# Patient Record
Sex: Male | Born: 1950 | Race: White | Hispanic: No | Marital: Married | Smoking: Never smoker
Health system: Southern US, Community
[De-identification: ages and names within clinical notes are randomized; demographics above are authoritative.]

## PROBLEM LIST (undated history)

## (undated) DIAGNOSIS — R39198 Other difficulties with micturition: Secondary | ICD-10-CM

## (undated) DIAGNOSIS — I38 Endocarditis, valve unspecified: Secondary | ICD-10-CM

## (undated) DIAGNOSIS — G629 Polyneuropathy, unspecified: Secondary | ICD-10-CM

## (undated) DIAGNOSIS — K469 Unspecified abdominal hernia without obstruction or gangrene: Secondary | ICD-10-CM

## (undated) DIAGNOSIS — E785 Hyperlipidemia, unspecified: Secondary | ICD-10-CM

## (undated) DIAGNOSIS — N189 Chronic kidney disease, unspecified: Secondary | ICD-10-CM

## (undated) DIAGNOSIS — I00 Rheumatic fever without heart involvement: Secondary | ICD-10-CM

## (undated) DIAGNOSIS — R600 Localized edema: Secondary | ICD-10-CM

## (undated) DIAGNOSIS — K760 Fatty (change of) liver, not elsewhere classified: Secondary | ICD-10-CM

## (undated) DIAGNOSIS — G4730 Sleep apnea, unspecified: Secondary | ICD-10-CM

## (undated) DIAGNOSIS — H6120 Impacted cerumen, unspecified ear: Secondary | ICD-10-CM

## (undated) DIAGNOSIS — G459 Transient cerebral ischemic attack, unspecified: Secondary | ICD-10-CM

## (undated) DIAGNOSIS — I240 Acute coronary thrombosis not resulting in myocardial infarction: Secondary | ICD-10-CM

## (undated) DIAGNOSIS — I509 Heart failure, unspecified: Secondary | ICD-10-CM

## (undated) DIAGNOSIS — I10 Essential (primary) hypertension: Secondary | ICD-10-CM

## (undated) HISTORY — DX: Transient cerebral ischemic attack, unspecified: G45.9

## (undated) HISTORY — PX: LITHOTRIPSY: SUR834

## (undated) HISTORY — DX: Endocarditis, valve unspecified: I38

## (undated) HISTORY — DX: Fatty (change of) liver, not elsewhere classified: K76.0

## (undated) HISTORY — DX: Essential (primary) hypertension: I10

## (undated) HISTORY — PX: CIRCUMCISION: SUR203

---

## 1998-02-08 ENCOUNTER — Emergency Department (HOSPITAL_COMMUNITY): Admission: EM | Admit: 1998-02-08 | Discharge: 1998-02-08 | Payer: Self-pay | Admitting: Emergency Medicine

## 1998-02-09 ENCOUNTER — Encounter: Admission: RE | Admit: 1998-02-09 | Discharge: 1998-05-10 | Payer: Self-pay | Admitting: Internal Medicine

## 2001-01-20 ENCOUNTER — Emergency Department (HOSPITAL_COMMUNITY): Admission: EM | Admit: 2001-01-20 | Discharge: 2001-01-20 | Payer: Self-pay | Admitting: Internal Medicine

## 2001-06-19 ENCOUNTER — Emergency Department (HOSPITAL_COMMUNITY): Admission: EM | Admit: 2001-06-19 | Discharge: 2001-06-20 | Payer: Self-pay | Admitting: Emergency Medicine

## 2001-06-20 ENCOUNTER — Encounter: Payer: Self-pay | Admitting: Emergency Medicine

## 2001-06-30 ENCOUNTER — Ambulatory Visit (HOSPITAL_COMMUNITY): Admission: RE | Admit: 2001-06-30 | Discharge: 2001-06-30 | Payer: Self-pay | Admitting: Cardiology

## 2001-08-29 ENCOUNTER — Encounter: Payer: Self-pay | Admitting: Family Medicine

## 2001-08-29 ENCOUNTER — Ambulatory Visit (HOSPITAL_COMMUNITY): Admission: RE | Admit: 2001-08-29 | Discharge: 2001-08-29 | Payer: Self-pay | Admitting: Family Medicine

## 2001-12-10 ENCOUNTER — Encounter: Payer: Self-pay | Admitting: Emergency Medicine

## 2001-12-10 ENCOUNTER — Emergency Department (HOSPITAL_COMMUNITY): Admission: EM | Admit: 2001-12-10 | Discharge: 2001-12-10 | Payer: Self-pay | Admitting: *Deleted

## 2003-10-06 ENCOUNTER — Emergency Department (HOSPITAL_COMMUNITY): Admission: EM | Admit: 2003-10-06 | Discharge: 2003-10-07 | Payer: Self-pay | Admitting: Emergency Medicine

## 2003-10-08 ENCOUNTER — Inpatient Hospital Stay (HOSPITAL_COMMUNITY): Admission: AD | Admit: 2003-10-08 | Discharge: 2003-10-10 | Payer: Self-pay | Admitting: Otolaryngology

## 2005-07-09 ENCOUNTER — Emergency Department (HOSPITAL_COMMUNITY): Admission: EM | Admit: 2005-07-09 | Discharge: 2005-07-09 | Payer: Self-pay | Admitting: Emergency Medicine

## 2006-07-15 ENCOUNTER — Encounter: Admission: RE | Admit: 2006-07-15 | Discharge: 2006-07-15 | Payer: Self-pay | Admitting: Family Medicine

## 2007-06-25 ENCOUNTER — Emergency Department (HOSPITAL_COMMUNITY): Admission: EM | Admit: 2007-06-25 | Discharge: 2007-06-25 | Payer: Self-pay | Admitting: Family Medicine

## 2007-09-05 ENCOUNTER — Inpatient Hospital Stay (HOSPITAL_COMMUNITY): Admission: RE | Admit: 2007-09-05 | Discharge: 2007-09-08 | Payer: Self-pay | Admitting: Internal Medicine

## 2007-09-06 ENCOUNTER — Ambulatory Visit: Payer: Self-pay | Admitting: Vascular Surgery

## 2007-09-07 ENCOUNTER — Encounter (INDEPENDENT_AMBULATORY_CARE_PROVIDER_SITE_OTHER): Payer: Self-pay | Admitting: Internal Medicine

## 2008-01-19 ENCOUNTER — Inpatient Hospital Stay (HOSPITAL_COMMUNITY): Admission: EM | Admit: 2008-01-19 | Discharge: 2008-01-24 | Payer: Self-pay | Admitting: Emergency Medicine

## 2008-01-19 ENCOUNTER — Ambulatory Visit: Payer: Self-pay | Admitting: Surgery

## 2008-01-19 ENCOUNTER — Encounter (INDEPENDENT_AMBULATORY_CARE_PROVIDER_SITE_OTHER): Payer: Self-pay | Admitting: Emergency Medicine

## 2008-01-23 ENCOUNTER — Encounter (INDEPENDENT_AMBULATORY_CARE_PROVIDER_SITE_OTHER): Payer: Self-pay | Admitting: Internal Medicine

## 2008-02-25 ENCOUNTER — Observation Stay (HOSPITAL_COMMUNITY): Admission: EM | Admit: 2008-02-25 | Discharge: 2008-02-27 | Payer: Self-pay | Admitting: Emergency Medicine

## 2008-02-25 ENCOUNTER — Ambulatory Visit: Payer: Self-pay | Admitting: Cardiology

## 2008-02-27 ENCOUNTER — Encounter (INDEPENDENT_AMBULATORY_CARE_PROVIDER_SITE_OTHER): Payer: Self-pay | Admitting: Internal Medicine

## 2008-12-10 ENCOUNTER — Ambulatory Visit: Payer: Self-pay | Admitting: Radiology

## 2008-12-10 ENCOUNTER — Emergency Department (HOSPITAL_BASED_OUTPATIENT_CLINIC_OR_DEPARTMENT_OTHER): Admission: EM | Admit: 2008-12-10 | Discharge: 2008-12-10 | Payer: Self-pay | Admitting: Emergency Medicine

## 2008-12-24 IMAGING — CR DG CHEST 1V PORT
1 series · 1 of 1 positions shown · non-contrast
Comparison: Chest radiograph 01/19/2008

CLINICAL DATA: Chest pain

PORTABLE CHEST - 1 VIEW

[view not recorded]
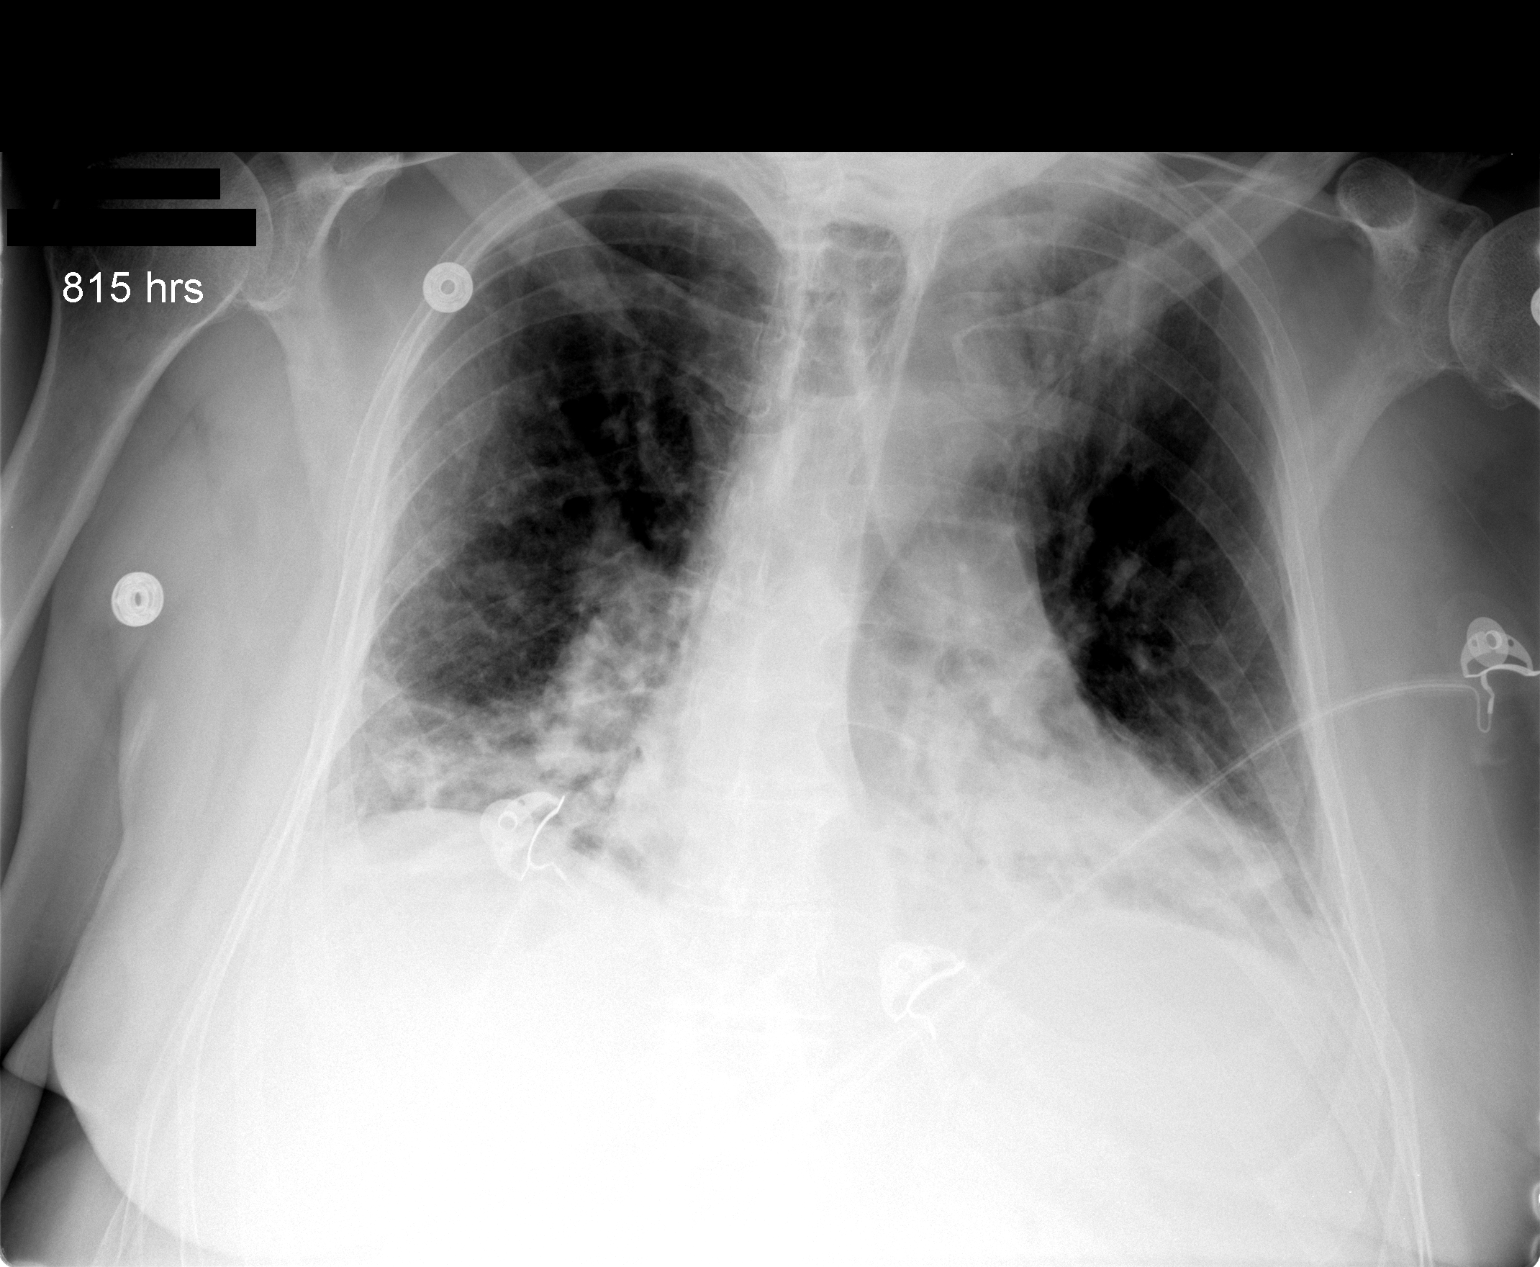

[1 of 1 positions shown; findings below may reference images not displayed]

FINDINGS: Stable cardiac silhouette.  There is increased air space
opacities at the left and right lung base.  Atelectasis in the left
upper lobe is similar to prior.  No pneumothorax.
IMPRESSION: 1..  New bibasilar air space disease representing pneumonia versus
aspiration.
2..  Left upper lobe atelectasis.  Recommend follow up radiographs
to assure resolution.

## 2010-01-30 ENCOUNTER — Emergency Department (HOSPITAL_COMMUNITY): Admission: EM | Admit: 2010-01-30 | Discharge: 2010-01-30 | Payer: Self-pay | Admitting: Emergency Medicine

## 2010-09-15 LAB — DIFFERENTIAL
Monocytes Relative: 6 % (ref 3–12)
Neutrophils Relative %: 71 % (ref 43–77)

## 2010-09-15 LAB — BASIC METABOLIC PANEL
BUN: 12 mg/dL (ref 6–23)
CO2: 30 mEq/L (ref 19–32)
GFR calc Af Amer: >60 mL/min (ref >60)
GFR calc non Af Amer: >60 mL/min (ref >60)
Glucose, Bld: 152 mg/dL — ABNORMAL HIGH (ref 70–99)
Sodium: 139 mEq/L (ref 135–145)

## 2010-09-15 LAB — CBC
Hemoglobin: 12.5 g/dL — ABNORMAL LOW (ref 13.0–17.0)
MCH: 30.8 pg (ref 26.0–34.0)
MCHC: 34.9 g/dL (ref 30.0–36.0)
MCV: 88.2 fL (ref 78.0–100.0)
Platelets: 250 10*3/uL (ref 150–400)

## 2010-09-15 LAB — HEPATIC FUNCTION PANEL
ALT: 47 U/L (ref 0–53)
Bilirubin, Direct: 0.1 mg/dL (ref 0.0–0.3)

## 2010-09-15 LAB — PROTIME-INR: Prothrombin Time: 12.9 seconds (ref 11.6–15.2)

## 2010-10-09 LAB — POCT CARDIAC MARKERS
CKMB, poc: <1 ng/mL — ABNORMAL LOW (ref 1.0–8.0)
Myoglobin, poc: 54.3 ng/mL (ref 12–200)

## 2010-10-09 LAB — BASIC METABOLIC PANEL
CO2: 29 mEq/L (ref 19–32)
Calcium: 8.9 mg/dL (ref 8.4–10.5)
Chloride: 103 mEq/L (ref 96–112)
Creatinine, Ser: 0.9 mg/dL (ref 0.4–1.5)
Sodium: 140 mEq/L (ref 135–145)

## 2010-10-09 LAB — CBC
HCT: 36.1 % — ABNORMAL LOW (ref 39.0–52.0)
Platelets: 263 10*3/uL (ref 150–400)
RDW: 13.1 % (ref 11.5–15.5)
WBC: 6.2 10*3/uL (ref 4.0–10.5)

## 2010-10-09 LAB — DIFFERENTIAL
Basophils Absolute: 0 10*3/uL (ref 0.0–0.1)
Basophils Relative: 0 % (ref 0–1)
Eosinophils Absolute: 0.1 10*3/uL (ref 0.0–0.7)
Lymphs Abs: 1.2 10*3/uL (ref 0.7–4.0)
Neutro Abs: 4.6 10*3/uL (ref 1.7–7.7)

## 2010-10-09 LAB — POCT B-TYPE NATRIURETIC PEPTIDE (BNP): B Natriuretic Peptide, POC: 24.4 pg/mL (ref 0–100)

## 2010-11-14 NOTE — H&P (Signed)
NAMEMEYER, ARORA               ACCOUNT NO.:  [id number]   MEDICAL RECORD NO.:  [id number]          PATIENT TYPE:  INP   LOCATION:  0112                         FACILITY:  [facility]   PHYSICIAN:  Herbie Saxon, MDDATE OF BIRTH:  11-29-1950   DATE OF ADMISSION:  01/19/2008  DATE OF DISCHARGE:                              HISTORY & PHYSICAL   PRIMARY CARE PHYSICIAN:  Della Goo, M.D.   HEALTH CARE POWER OF ATTORNEY:  His wife, Teofil Maniaci.  Phone number  [phone].  He is a full code.   PRESENTING COMPLAINT:  Weakness, three days.   HISTORY OF PRESENTING COMPLAINT:  This is a 60 year old male who was  well until three days ago when he was walking in the son outside a [employer], and he suddenly noticed diaphoresis, starting feeling extremely  weak and sweaty, had an episode of nausea.  He also noticed shortness of  breath on exertion.  Yesterday, the patient noticed fever, chills,  difficulty with urination.  However, he denies any cough, diarrhea, or  dysuria.  Today, the patient noticed left-sided chest soreness, dull,  nonradiating, no syncopal association or wheezing.  Patient recently  traveled to [place] on a bus for 24 hours.  At that time, he had left  leg swelling, which has receded.  Presently, he has right leg swelling,  which has been off and on intermittently since the 1990s.  He has cramps  and numbness in the right second toe.  Patient denies any skin rash or  joint swelling.  He has not been in contact with sick persons or with  flu-like symptoms.   On presentation to the emergency room, he had a high-grade fever, severe  hypertension, and liver enzymes were markedly elevated.   PAST MEDICAL HISTORY:  1. Peripheral edema.  2. Rheumatic fever in childhood.   FAMILY HISTORY:  Mother had emphysema.  Two brothers, coronary artery  disease.  A sister with coronary artery disease.   SOCIAL HISTORY:  He is married.  There is no history of  alcohol,  tobacco, or illicit drug abuse.   PAST SURGICAL HISTORY:  Kidney stone obstruction in the 1980s.   REVIEW OF SYSTEMS:  Fourteen systems were reviewed.  Pertinent positives  are as in the history of presenting complaint.   MEDICATIONS:  1. Darvocet-N 100 p.r.n.  2. Lasix 1 daily.  3. Potassium chloride 1 daily.   No known drug allergies.   PHYSICAL EXAMINATION:  A middle-aged man with truncal obesity.  Not in  acute respiratory distress.  Temperature 101.7, pulse 110, respiratory rate 20, blood pressure  165/101.  Pupils are equal and reactive to light and accommodation.  SKIN:  Clammy.  NECK:  Supple.  Mucous membranes are moist.  Head is normocephalic and atraumatic.  There is no elevated JVD or thyromegaly.  CHEST:  She has reduced breath sounds bibasilarly.  No rhonchi.  ABDOMEN:  Mild suprapubic and epigastric tenderness.  No organomegaly.  Bowel sounds are normoactive.  Inguinal orifices are patent.  He is alert and oriented to time, place, and person.  No asterixis.  He has 2+ pedal edema on the right and reduced peripheral pulses, mostly  on the right.  No clubbing or cyanosis.   LABORATORY TESTS:  WBCs 13.8, hematocrit 38, platelet count 308.  D-  dimer is 0.42.  INR 1.1.  PTT 34.  Chemistry shows a sodium of 134,  potassium 4, chloride 97, bicarbonate 28, glucose 142, BUN 8, creatinine  0.9.  Total CPK 745.  Index 0.1, MB fraction 0.9.  Troponin I is 0.04.  Urinalysis shows moderate leukocyte esterase, WBCs 7-10.  Liver exam:  AST is 132, ALT 216, ALP 142, total bilirubin 1.3.   Chest x-ray shows cardiomegaly, changes of COPD, chronic bronchitis,  mild bibasilar atelectasis.   EKG shows sinus tachycardia at 108 per minute with left axis deviation  and left ventricular hypertrophy.   ASSESSMENT:  1. Hypertensive urgency, acute hepatitis, viral versus secondary to      underlying sepsis.  2. Urinary tract infection.  3. Atypical chest pain.  4.  Rhabdomyolysis.  5. Mild hypernatremia.  6. Sinus tachycardia.  7. Query new onset of diabetes.  8. Leukocytosis.  9. Morbid obesity.   PLAN:  Patient is going to be admitted to a telemetry bed.  Will obtain  serial cardiac enzymes and EKG q.8h. x3.  Will follow up on abdominal  ultrasound to see his liver architecture.  Obvious bilateral leg venous  Doppler to rule out DVT.  Hepatitis B surface antigen, hepatitis C  antibody, ESR, and antinuclear antibody.  Will obtain hemoglobin A1C,  BNP, thyroid function tests, fasting lipids, and will send his blood  culture and urine culture.  He is to be on bedrest, monitor his input  and output strictly, oxygen 2-5 liters via nasal cannula p.r.n. to keep  sats greater than 90.   DIET:  Heart-healthy.   ACTIVITY:  Bedrest.   Heparin 5000 units subcu q.8h.  Phenergan 25 mg IV q.8h. p.r.n.  Protonix 40 mg IV daily.  Duonebs 1 unit dose q.6h. p.r.n. shortness of  breath.  Tylenol 650 mg p.o. p.r.n. mild-to-moderate pain.  Hold Tylenol  for now.  Ultram 50 mg q.8h. p.r.n.  Morphine 2 mg IV q.6h. p.r.n. chest  pain.  Nitroglycerin 0.4 mg sublingual q.83min. x3.  Rocephin 1 gm IV  daily.   Recheck his labs in the morning.   Lopressor 2.5 mg IV q.6h. p.r.n. for BP greater than 160/110.   Obtain fasting lipids and homocysteine level.  Also obtain an  echocardiogram if cardiac enzymes are positive.  Consult cardiology  input on cardiac catheterization also.  Patient is to stay on his  medications.  The treatment plan explained to him, and he and wife  verbalized understanding.      Herbie Saxon, MD  Electronically Signed     MIO/MEDQ  D:  01/19/2008  T:  01/19/2008  Job:  [id number]

## 2010-11-14 NOTE — Discharge Summary (Signed)
[name], [name]               ACCOUNT NO.:  [id number]   MEDICAL RECORD NO.:  [id number]          PATIENT TYPE:  INP   LOCATION:  1515                         FACILITY:  [facility]   PHYSICIAN:  Hind I Elsaid, MD      DATE OF BIRTH:  Nov 25, 1950   DATE OF ADMISSION:  09/05/2007  DATE OF DISCHARGE:  09/08/2007                               DISCHARGE SUMMARY   PRIMARY CARE PHYSICIAN:  Dr. Della Goo.   DISCHARGE DIAGNOSES:  1. Right lower extremity swelling.  Deep venous thrombosis was ruled      out.  2. History of chronic obstructive pulmonary disease/emphysema.  3. History of cor pulmonale.  4. History of peripheral artery disease.  5. Cardiomyopathy.  6. Benign ulcers.  7. Peritonitis.  8. Removal of kidney stone.  9. Hypertension.  10.History of umbilical/ventral hernia.  11.Hyperalbuminemia.   DISCHARGE MEDICATIONS:  1. Norvasc 5 mg p.o. daily.  2. Lasix 40 mg p.o. daily for two weeks.  3. Potassium chloride 10 mEq p.o. daily for two weeks.  4. Trimicinolone cream applied to the glans penis twice daily.   CONSULTATIONS:  Urology consulted for evaluation of the benign ulcers.  Diagnosis was balanitis.   PROCEDURES:  Venous Doppler of lower extremity was negative for DVT,  Baker cyst.   X-RAY:  Cardiomegaly and stable COPD and chronic change.   HISTORY OF PRESENT ILLNESS:  Please review the history done by Dr.  Della Goo.  In summary, he is a 60 year old male who was directly  admitted from Dr. Della Goo secondary to the swelling of the  right lower leg and fluid for about 3 to 5 days.  Swelling was on and  off in both lower extremities.   1. The patient was admitted for evaluation of that swelling.  Also      admitted to have benign ulcer.  The patient was admitted and      started on full-dose Lovenox for DVT.  Uric acid was also drawn      which was 5 within normal range.  Venous Doppler was negative.      Lovenox was discontinued.  We felt  that lower extremity swelling      could be a combination of hypoalbuminemia and cor pulmonale.  The      patient will be started on a small dose of Lasix 40 mg p.o. with      potassium supplement p.o.  The patient was advised to keep the      lower extremity elevated.  The patient has to follow up with Dr.      Della Goo.  2. Benign ulcerations.  The patient's pain is evaluated by Dr. Brunilda Payor.      Noted to have benign ulceration and difficulty voiding.  The      patient to continue Microgel to the glans b.i.d.  the patient to      follow with Dr. Brunilda Payor as an outpatient.  We felt that the patient is      medically stable to be discharged home.  During admission, the  patient also complained of dysphagia mainly to pills.  The patient      was advised to crush pills really good and his symptoms would      regress.   Consider review by the primary care for progression of swelling of his  right lwer extremity swelling and to evaluate his swallow and EGD.      [name], MD  Electronically Signed     HIE/MEDQ  D:  09/08/2007  T:  09/08/2007  Job:  [id number]

## 2010-11-14 NOTE — H&P (Signed)
NAMECASHUS, HALTERMAN               ACCOUNT NO.:  [id number]   MEDICAL RECORD NO.:  [id number]          PATIENT TYPE:  INP   LOCATION:  1825                         FACILITY:  [facility]   PHYSICIAN:  Vania Rea, M.D. DATE OF BIRTH:  [birth year]   DATE OF ADMISSION:  02/25/2008  DATE OF DISCHARGE:                              HISTORY & PHYSICAL   PRIORITY ADMISSION HISTORY AND PHYSICAL   PRIMARY CARE PHYSICIAN:  Della Goo, MD.   UROLOGISTLindaann Slough, MD.   CHIEF COMPLAINT:  Transient left-sided weakness.   HISTORY OF PRESENT ILLNESS:  This is a 60 year old Caucasian gentleman  with a history of hypertension and diabetes controlled on diet, who was  in his baseline state of health until about 1 p.m. at work today.  He  noticed he was dragging his left leg and developed blurring of vision.  This progressed to slurring of his speech by about 2:30 p.m.  He  discussed it with co-workers, who felt it may be due to side effects of  medication.  By about 3 p.m., the neurological deficits spontaneously  resolved.  He discussed it with a supervisor, who advised him to call  EMS.  EMS came.  By that time, his deficits had resolved, but he was  advised to come to the emergency room in any case.  The patient's  history was discussed with the neurologist and a decision was taken to  admit him for observation.   Patient has no previous history of a CVA or a TIA.  He does give an oral  history of numbness of feet at night, but no lateralizing signs.  He has  no history of hyperlipidemia.  There is no family history of stroke, but  there is a family history of coronary artery disease in siblings.  He  does not smoke or drink alcohol.   PAST MEDICAL HISTORY:  1. Discharged from [place] about 4 weeks ago after treatment for      pneumonia and newly discovered diabetes controlled on diet.  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  2. History of emphysema.  3. History of leaky heart valve.  4. History of fatty liver.   FAMILY HISTORY:  Significant for a mother with emphysema, coronary  artery disease in 2 brothers and a sister.   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   REVIEW OF SYSTEMS:  Significant for constipation, episodic lower  extremity edema, episodic bedtime numbness of the feet, episodic  anxiety, sometimes has kidney stones, but no dysuria.   MEDICATIONS:  Patient is unaware of the doses, but reports Lasix,  Gabapentin, potassium chloride, Darvocet, and aspirin 81 mg.  Discharge  summary 4 weeks ago indicates that he was also put on Norvasc 10 mg as  well as Nystatin Triamcinolone cream for his penile area.   ALLERGIES:  No known drug  allergies.   PHYSICAL EXAMINATION:  GENERAL:  He is a pleasant, middle-aged,  Caucasian gentleman sitting up in the stretcher in no acute distress,  somewhat anxious.  VITAL SIGNS:  Temperature is 97.4, pulse 87, respiration 18, blood  pressure 129/72, he is saturating 95% on room air.  He is in no pain.  HEENT:  His pupils are round and equal, mucous membranes are pink and  anicteric.  NECK:  He has no cervical lymphadenopathy, no thyromegaly, no  jugulovenous distention.  CHEST:  Clear to auscultation bilaterally.  CARDIOVASCULAR:  Regular rhythm, he has a 2/6 soft systolic murmur with  a loud S2.  ABDOMEN:  Obese, soft, and nontender, he has an umbilical hernia, his  bladder does not appear to be distended.  EXTREMITIES:  He has trace edema bilaterally.  He has 2+ pulses  bilaterally and some dilated external veins.  His feet are dirty and  unkempt.  He has no bony joint deformity.  CENTRAL NERVOUS SYSTEM:  Cranial nerves II-XII are intact.  He has no  focal neurologic deficits.  He has grade 5 power throughout.  GENITOURINARY:  He is  circumcised, he has an eschar covering the entire  glans of the penis, no meatus is evident, the base of the glans is  somewhat erythematous.   LABORATORY DATA:  His CBC is unremarkable with a white count of 6.9,  hemoglobin 12.7, MCV 89, platelet count is 306. He has a normal  differential on his white count.  His coags are normal.  Her serum  chemistry:  Sodium 137, potassium 3.7, chloride 102, CO2 27, BUN 13,  creatinine 0.9, glucose 128, calcium 9.2.  His total protein is 6.9,  albumin 3.5, AST 22, ALT 33, total bilirubin is 0.4.  His chest x-ray  shows mild cardiomegaly, no pulmonary edema or infiltrate.  CT of the  head shows chronic left maxillary and ethmoid sinusitis with  opacification of multiple left mastoid air cells on the left suggesting  mild mastoid sinusitis; no acute intracranial findings.   ASSESSMENT:  1. Transient ischemic attack.  2. Hypertension controlled.  3. Diabetes type 2 controlled on diet.  4. 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   PLAN:  1. Will admit this gentleman for evaluation for cardiovascular risk      factors and start risk reduction as necessary.  2. Have discussed his penile situation with the urologist on call for      Dr. Brunilda Payor and have been informed that, although there appears to be      no meatus, he usually is able to urinate without difficulty as they      know him well.  He is to be assessed for urinary retention and if      urinary retention is present they will come in to see him.  3. Other plans as per orders.  It is expected that this admission is      for observation and patient should be able to be discharged home      tomorrow if no further complications develop.      Vania Rea, M.D.  Electronically Signed     LC/MEDQ  D:  02/25/2008  T:  02/26/2008  Job:  [id number]   cc:   Della Goo, M.D.  Lindaann Slough, M.D.

## 2010-11-14 NOTE — Consult Note (Signed)
NAMECLEAVEN, Harold               ACCOUNT NO.:  [id number]   MEDICAL RECORD NO.:  [id number]          PATIENT TYPE:  INP   LOCATION:  1515                         [facility]ACILITY:  [facility]   PHYSICIAN:  Heloise Purpura, MD      DATE OF BIRTH:  19-Jan-1951   DATE OF CONSULTATION:  09/05/2007  DATE OF DISCHARGE:                                 CONSULTATION   REQUESTING PHYSICIAN:  Della Goo, M.D.   REASON FOR CONSULTATION:  Penile lesion.   HISTORY:  Mr. Gluth is a 60 year old who is seen in consultation at the  request of Dr.  Della Goo for a penile lesion.  According to the  patient, he has had a lesion on his penis for over 1 year.  He has  previously been evaluated by Dr. Brunilda Payor for balanitis,  which was treated  with combination of Nystatin/triamcinolone cream.  The patient  subsequently did not follow up and, therefore, did not undergo further  evaluation due to failure to follow up.  He was admitted to the hospital  earlier today with right lower extremity edema.  He denies any fever.  He does state that he has difficulty voiding within an intermittently  weak stream and some deflection of his stream, although at times he  feels that he can void well and empty to completion.   PAST MEDICAL HISTORY:  1. Sleep apnea.  2. Anxiety.  3. History of heart failure.  4. Depression.  5. Hypertension.   PAST SURGICAL HISTORY:  1. History of circumcision at age 44.  2. History of renal surgery.   FAMILY HISTORY:  There is a paternal history of bladder cancer.   SOCIAL HISTORY:  The patient is married.  He denies alcohol or tobacco  use.   REVIEW OF SYSTEMS:  As as stated above.   PHYSICAL EXAMINATION:  GU:  The patient has an eschar over his glans  penis which was able to be removed.  He appears to have a blister toward  his frenulum ventrally and erythema and inflammatory changes of his  glans penis circumferentially.  There is no discrete penile lesion.  His  urethral  meatus was probed and appears to be patent.   IMPRESSION:  Balanitis.   RECOMMENDATIONS:  The patient will be restarted on  Nystatin/triamcinolone cream applied to the glans penis twice daily.  I  am unsure whether his difficulty voiding was due to the eschar over his  glans penis, which has now  been removed, or truly due to meatal obstruction.  We will check post  void residuals and monitor his voiding and if need be consider placement  of a catheter.  I will plan to notify Dr. Brunilda Payor of the patient's  admission for further follow-up.      Heloise Purpura, MD  Electronically Signed     LB/MEDQ  D:  09/05/2007  T:  09/08/2007  Job:  [id number]   cc:   Della Goo, M.D.  Fax: [phone]

## 2010-11-14 NOTE — Discharge Summary (Signed)
[name], [name]               ACCOUNT NO.:  [id number]   MEDICAL RECORD NO.:  [id number]          PATIENT TYPE:  INP   LOCATION:  3020                         FACILITY:  [facility]   PHYSICIAN:  Renee Ramus, MD       DATE OF BIRTH:  September 29, 1950   DATE OF ADMISSION:  02/25/2008  DATE OF DISCHARGE:  02/27/2008                               DISCHARGE SUMMARY   PRIMARY DIAGNOSIS:  Acute cerebrovascular accident manifesting as  transient ischemic attack.   SECONDARY DIAGNOSES:  1. Chronic glans penis scar.  2. Diabetes mellitus type 2, well controlled.  3. Hyperlipidemia.  4. Hypertension.   HOSPITAL COURSE:  Problem #1:  Acute CVA.  The patient is a 60 year old  male with a history of hypertension and diabetes who was admitted  secondary to left foot drag and blurred vision and slurring of speech.  The patient was seen in emergency department, admitted to our service.  He was seen in neurological consult.  The patient did receive a CTA that  showed no intracranial vascular abnormalities and carotids that showed  no signs of significant stenosis.  The patient does have a  echocardiogram that is pending.  This will be done prior to discharge.  The patient has no residual deficits.  He is not a rehab candidate.  He  is being discharged on aspirin, statin therapy, as well as ACE  inhibitor.  The patient will follow up with his primary care physician  within 2 weeks for further adjustment of his medications and monitoring  of any potential toxic effects of the statin therapy.  Problem #2:  Diabetes mellitus:  The patient has been diagnosed with  this and is currently diet controlled.  Hemoglobin A1c is pending at the  time of discharge and this will be reviewed when available.  The patient  is not being started on diabetic medication currently, although the  sugars have been running in the 120s to 130s.  Problem #3.  Hyperlipidemia:  The patient does have significant  hyperlipidemia,  which has been treated with statin therapy.  Problem #4.  Glans penis scar:  This is chronic and possible urinary  retention is treated with diuretics.   LABORATORY DATA:  Labs of note,  1. Mild anemia with a hemoglobin of 12.4 and hematocrit of 36.1.  2. Elevated glucose at 128.  3. Total cholesterol of 244 with triglycerides of 245, cholesterol      332, HDL 32, LDL of 163, and VLDL of 49.  4. TSH of 1.28 and free T4 of 1.18.   DIAGNOSTIC STUDIES:  1. CT head without contrast showing acute intracranial findings.  2. CT angiogram of the head and neck showing a minimal atherosclerotic      irregularity at the proximal left internal carotid artery without      significant stenosis, otherwise no acute abnormalities.  3. Chest film showing mild cardiomegaly, but no pulmonary edema or      infiltrate.   MEDICATIONS ON DISCHARGE:  1. Aspirin 81 mg p.o. daily.  2. Neurontin 300 mg p.o. b.i.d.  3. Multivitamin p.o.  daily.  4. Potassium chloride 10 mEq p.o. daily.  5. Darvocet-N 100 one p.o. q.8 h. p.r.n. pain.  6. Lasix   The patient is stable and anxious for discharge. There are no labs or  studies pending at time of discharge.   TIME SPENT:  35 minutes.      Renee Ramus, MD  Electronically Signed     JF/MEDQ  D:  02/27/2008  T:  02/28/2008  Job:  [id number]   cc:   Della Goo, M.D.

## 2010-11-14 NOTE — Consult Note (Signed)
[name], WEISSBERG               ACCOUNT NO.:  [id number]   MEDICAL RECORD NO.:  [id number]          PATIENT TYPE:  INP   LOCATION:  3020                         FACILITY:  [facility]   PHYSICIAN:  Heloise Purpura, MD      DATE OF BIRTH:  Aug 19, 1950   DATE OF CONSULTATION:  02/26/2008  DATE OF DISCHARGE:                                 CONSULTATION   REASON FOR CONSULTATION:  Possible urinary retention.   HISTORY:  Mr. Ferraris is a 60 year old gentleman seen in consultation at  the request of Dr. Vania Rea for possible urinary retention.  He  was admitted to the emergency department and a decision has been made to  admit him to the hospital as well due to presentation consistent with a  transient ischemic attack.  He does have a history of balanitis for  which he has been followed by Dr. Brunilda Payor chronically.  This has been  managed with nystatin/triamcinolone cream.  I actually saw this patient  for consultation in March 2009, at which time he was felt to have a  possible penile lesion and urinary retention.  He did have some eschar  formation that was loosely attached to the glans penis, which was simply  removed at that time and he was subsequently able to void again without  difficulty.  This evening, he was examined and again felt to have eschar  over his penis with no urethral meatus identified.  Although, the  patient denies any discomfort, he did describe more difficulty urinating  with a split strain.  According to the patient, he was voiding fairly  normally yesterday and this morning.   PAST MEDICAL HISTORY:  1. Sleep apnea.  2. Anxiety.  3. History of heart failure.  4. Depression.  5. Hypertension.   PAST SURGICAL HISTORY:  1. History of circumcision at age 9.  2. History of renal surgery.   FAMILY HISTORY:  There is a paternal history of bladder cancer.   SOCIAL HISTORY:  The patient is married.  He denies tobacco or alcohol  use.   REVIEW OF SYSTEMS:   CARDIOVASCULAR:  He denies any chest pain or  shortness of breath.  PULMONARY:  He denies any cough or hemoptysis.  GI:  He denies any nausea or vomiting.  GU:  He has had a somewhat  weakened strain.  He denies hematuria or urinary frequency.   PHYSICAL EXAMINATION:  GU:  The patient has an eschar over his glans  penis, which was very loosely attached to his glans.  This was easily  removed and his underlying glans penis was examined with his urethral  meatus easily identified.   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   RECOMMENDATIONS:  I would plan to allow the patient to void  spontaneously.  He can be assessed with a postvoid residual urine during  this hospitalization and if it appears to be elevated, could potentially  require Foley catheterization.  However, at this time, based on the  patient's history of voiding normally earlier today, I would suspect  that he will be able to void  without difficulty and does not require  catheterization at this time.      Heloise Purpura, MD  Electronically Signed     LB/MEDQ  D:  02/26/2008  T:  02/26/2008  Job:  [id number]   cc:   Vania Rea, M.D.  Lindaann Slough, M.D.

## 2010-11-14 NOTE — H&P (Signed)
[name], [name]               ACCOUNT NO.:  [id number]   MEDICAL RECORD NO.:  [id number]          PATIENT TYPE:  INP   LOCATION:  1515                         FACILITY:  [facility]   PHYSICIAN:  Della Goo, M.D. DATE OF BIRTH:  06/06/1951   DATE OF ADMISSION:  09/05/2007  DATE OF DISCHARGE:                              HISTORY & PHYSICAL   PRIMARY CARE PHYSICIAN:  Della Goo, M.D.   CHIEF COMPLAINT:  Right foot and lower leg swelling and pain.   HISTORY OF PRESENT ILLNESS:  This is a 60 year old male who was directly  admitted from my office secondary to complaints of severe swelling of  the right lower leg and foot for the past 3-5 days.  The patient reports  having swelling off and on which usually occurs in both lower  extremities.  He was found to have 2+ pitting edema in the right foot  and 1+ edema up to the prepatellar area of the right lower extremity.  The patient reports having severe pain in the right foot.  He describes  the pain as being a sharp and shooting pain and there is difficulty  bearing weight in the right lower leg and foot.   The patient was also found to have another condition in the office.  The  patient reports having a penile ulcer which he has had for many years.  He reports off and on that the area scabs up and the scabbing falls off,  but returns.  The patient denies having any problems voiding.  He does  report being seen by an urologist in the past, approximately 1 year ago.  He was seen by Dr. Brunilda Payor, urology and was evaluated and prescribed  medication.  He was unable to followup.  The patient does report having  pain around the area as well.   The patient does also have multiple medical problems and he reports  losing his insurance approximately 1 year ago.   PAST MEDICAL HISTORY:  1. Significant for COPD/emphysema.  2. Cor pulmonale.  3. Peripheral arterial disease/peripheral vascular disease.  4. Cardiomyopathy.  5. Penile  ulcer.  6. Nephrolithiasis.  7. History of a circumcision.  8. Removal of kidney stone.  9. Hypertension.  10.The patient also has an umbilical/ventral hernia.   CURRENT MEDICATIONS:  None.  The patient previously had been on  hydrochlorothiazide therapy for hypertension.   ALLERGIES:  NO KNOWN MEDICAL ALLERGIES.  However, the patient does  report having palpitations with ADVIL PM.  He does report having no  problem taking any other over-the-counter nonsteroidals.  The thought is  that his adverse effect is to the BENADRYL component of the Advil PM.   SOCIAL HISTORY:  The patient is married.  No children.  Tobacco history-  -nonsmoker, nondrinker and no illicit drug usage.   FAMILY HISTORY:  Mother with hypertension and coronary artery disease in  his mother, sister and 2 brothers. Cancer--father had bladder CA.   REVIEW OF SYSTEMS:  The patient does report having fatigue, dizziness,  lightheadedness.  He reports having occasional chest pain, shortness of  breath and  edema.  He also reports having intermittent diarrhea,  constipation.  He does report having dysuria and hematuria at times.   PHYSICAL EXAMINATION:  GENERAL:  This is an obese 60 year old male in  discomfort, but no acute distress.  VITAL SIGNS:  Temperature 98.5, blood pressure 140/82, heart rate 86,  respirations 22, O2 saturation 99% on room air, weight 212 pounds,  height 5 feet 7 inches or 67 inches.  HEENT:  Normocephalic, atraumatic.  No scleral icterus.  Pupils are equally round and reactive to light.  Extraocular muscles are intact.  Funduscopic benign.  Oropharynx is  clear.  NECK:  Supple.  Full range of motion.  No thyromegaly, adenopathy,  jugular venous distention.  CARDIOVASCULAR:  Regular rate and rhythm.  LUNGS:  Clear to auscultation bilaterally.  ABDOMEN:  Positive large umbilical hernia present.  It is reducible and  soft.  Positive bowel sounds, soft, nontender, nondistended abdomen.  It  is  obese.  Tympanic in all 4 quadrants.  No hepatosplenomegaly.  No  rebound or guarding.  EXTREMITIES:  With 2+ pedal edema and 1+ edema to the prepatellar area  of the right lower extremity.  Full range of motion of both lower  extremities.  No cyanosis, clubbing or edema in the left lower  extremity.  NEUROLOGIC:  Nonfocal.  GENITOURINARY:  Positive crusting and denudation of the meatus of the  penis.  There is a vascular-appearing bleb below the penis and on the  scrotum contiguous with the penile shaft.  The meatal area is also  friable and so is the left upper quadrant area of the anterior scrotum  area.   LABORATORY DATA:  Laboratory studies are pending at this time.   ASSESSMENT:  23. A 60 year old male being admitted with right lower extremity edema,      rule out deep venous thrombosis.  2. Chronic penile ulcer.  3. Hypertension.  4. Cor pulmonale history.  5. Cardiomyopathy.  6. Chronic obstructive pulmonary disease/emphysema.   PLAN:  The patient will be admitted and a venous duplex ultrasound study  will be ordered of the right lower extremity to rule out a DVT.  Full-  dose Lovenox therapy has been ordered.  An urology consultation has also  been  placed for a treatment plan of the chronic ulceration.  Also there is a  question of whether or not this is a neoplastic process.  Laboratory  studies on admission have been ordered and also a chest x-ray.  Case  management will also be consulted to assist the patient with his  medical/financial resources.      Della Goo, M.D.  Electronically Signed     HJ/MEDQ  D:  09/05/2007  T:  09/06/2007  Job:  [id number]   cc:   Della Goo, M.D.  Fax: [phone]

## 2010-11-14 NOTE — Discharge Summary (Signed)
[name], [name]               ACCOUNT NO.:  [id number]   MEDICAL RECORD NO.:  [id number]          PATIENT TYPE:  INP   LOCATION:  1442                         FACILITY:  [facility]   PHYSICIAN:  Hind I Elsaid, MD      DATE OF BIRTH:  Jan 16, 1951   DATE OF ADMISSION:  01/19/2008  DATE OF DISCHARGE:  01/24/2008                               DISCHARGE SUMMARY   PRIMARY CARE PHYSICIAN:  Della Goo, M.D.   DISCHARGE DIAGNOSES:  1. Atypical chest pain.  2. Left lung pneumonia.  3. Sinus tachycardia, resolved.  4. Hypertension.  5. Deconditioning.  6. Fatty liver.  7. Abnormal liver function tests felt to be secondary to fatty liver.  8. Leukocytosis, resolved.  9. Chronic balanitis.  10.Staph aureus of the penile ulcer.  11.Obesity.  12.Diabetes mellitus, controlled with diet.  13.Nausea and vomiting, resolved, felt to be secondary to possible      viral infection.  14.Chronic bilateral lower extremity swelling.   MEDICATIONS:  1. Aspirin 81 mg daily.  2. Norvasc 10 mg daily.  3. Avelox 400 mg daily for 1 week.  4. Mycolog cream which is nystatin/triamcinolone cream apply to the      penile area q.12 h.  5. Lasix 40 mg.  6. Potassium chloride 10 mEq daily.   PROCEDURES:  1. Chest x-ray:  Mild bibasilar atelectasis.  2. Ultrasound of the abdomen:  Hepatomegaly with fatty infiltration of      the liver.  3. Chest x-ray:  New bibasilar airspace disease representing pneumonia      versus aspiration.  4. Next x-ray:  Improvement in right basal aeration.  No change of      airspace disease in the left lung apex and left base.   CONSULTATIONS:  None.   HISTORY OF PRESENT ILLNESS:  This is a 61 year old male who presented  with diaphoresis, generalized body weakness, sweating, some shortness of  breath on exertion and fever.  Found to have fever of 101.7 and heart  rate of 110.  At that time, the patient also complained of chest pain  felt to be admission for evaluation of  his above symptoms.   HOSPITAL COURSE:  1. Chest pain.  The patient had an EKG which shows sinus tachycardia      with a heart rate of 103.  The patient admitted to telemetry.      Serial EKG did not show any significant change and cardiac enzymes      did not show any elevation.  We felt the chest pain is atypical in      nature.  2. Hypertension.  The patient prescribed Norvasc and blood pressure      remained under good control.  3. Elevated liver function tests.  Hepatitis profile was negative.      Had an ultrasound that showed fatty liver.  Liver function tests      significantly improved.  We felt the elevated LFTs secondary to      fatty liver.  4. Left lower lobe pneumonia.  The patient had repeat chest x-ray  after developing fever which showed evidence of left lower lobe      pneumonia.  Accordingly, the patient was started on Avelox.  Since      then, the patient has no evidence of white blood cells or fever.      Last white blood cells were 8.2 and hemoglobin remained at 11.  5. Diet controlled diabetes.  Hemoglobin A1c 6.6.  The patient      educated about diet control.  At this time, we will hold on any      diabetic medications.  We will try with diet only.  6. Chronic lower extremity swelling.  Has venous Dopplers which were      negative.  We recommended hose for his lower extremities and keep      lower extremity elevated.  7. Chronic balanitis.  Dr. Brunilda Payor evaluated the patient during      hospitalization where he recommend to continue with his Mycolog      which is triamcinolone cream for this area.  He has to follow up as      outpatient.  8. During hospitalization, the patient has no further chest pain and      was felt that chest pain is atypical.  The patient was advised to      return to the hospital if he had another chest pain and further      evaluation by his primary care physician.  We felt the patient is      medically stable to be discharged home  and follow up with Dr.      Della Goo as outpatient.      Hind Harold Helper, MD  Electronically Signed     HIE/MEDQ  D:  01/24/2008  T:  01/24/2008  Job:  [id number]   cc:   Della Goo, M.D.  Fax: [phone]

## 2010-11-17 NOTE — Consult Note (Signed)
[name], [name]                           ACCOUNT NO.:  [id number]   MEDICAL RECORD NO.:  [id number]                   PATIENT TYPE:  INP   LOCATION:  5038                                 FACILITY:  [facility]   PHYSICIAN:  Melissa L. Ladona Ridgel, MD               DATE OF BIRTH:  August 04, 1950   DATE OF CONSULTATION:  10/08/2003  DATE OF DISCHARGE:  10/10/2003                                   CONSULTATION   REASON FOR CONSULTATION:  Management of hypertension and assessment of lower  extremity swelling and abdominal increasing girth.   CHIEF COMPLAINT:  Epistaxis with hypertension.   HISTORY OF PRESENT ILLNESS:  The patient is a 60 year old white male with  epistaxis starting on Wednesday evening. The patient states that he felt  stuffy after being exposed at his work to [employer] and feathers, and some  dead pigeons. He went home and started using Sudafed which he was taking in  excess to the directed amounts. This would have been Tuesday evening. On  Wednesday the patient presented to the emergency room with aggressive  nosebleed and was found to have a blood pressure that was elevated. He did  not recall the numbers; however, he was told that it was quite elevated.  They gave him a small pill to treat his blood pressure and we were able to  control the epistaxis and sent him home.  The patient states that he got  home and approximately 45 minutes later he again began to have nosebleeds.  He applied pressure and over the course of Thursday and Friday he continued  to have on and off trouble with perfuse nosebleeds. On Friday morning he  went to the walk-in clinic at [facility] and was sent to Dr. Raye Sorrow office  to have further treatments. Dr. Lazarus Salines evidently did some cautery as well  as packed the left nostril and decided to send the patient to the hospital  for admission.   REVIEW OF SYSTEMS:  The patient complains obviously of some pain in his left  nose and surrounding the left  eye. He complains of numbness in his toes,  which has been chronic. Today, it seems to be a little bit more troublesome.  He describes the left leg pain with numbness which has been chronic. He  states that he has had increasing abdominal girth with increased weight  since January.  His wife relates that he is very sensitive to all  medications and tends to swell which is similar to what his mother did in  her lifetime. He has had a cough since being exposed to the dirty  environment that contains pigeon dirt, etc. He denies fever, chills, nausea,  vomiting. He denies diarrhea. He denies chest pain, abdominal pain, melena,  hematochezia, or dysuria. He does complications of mild difficulty  urinating. All other review of systems  are negative.   PAST MEDICAL HISTORY:  He had been diagnosed with hypertension back many  years. His psychiatrist has been placed him on Lopressor and then  discontinued the drug. He states that his blood pressure gets high when he  is excited and he is not currently taking any medications for this. He has  a history of rheumatic fever and has been told that he has a murmur.  Previously he had a history of depression, but currently is not having any  trouble with that or taking any medications.   PAST SURGICAL HISTORY:  He has had a ortho during in the past, circumcision  late in life, and kidney stones.   ALLERGIES:  No known drug allergies.   SOCIAL HISTORY:  He denies tobacco use. He denies ethanol use. 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   FAMILY HISTORY:  His mother is deceased secondary to an enlarged heart.  Father had cirrhosis, but died from pneumonia.   MEDICATIONS:  At this time he takes an aspirin 81 mg every other day and  multivitamins daily.   PHYSICAL EXAMINATION:  VITAL SIGNS: Blood pressure measured with a manual  cuff found to be 140/90, pulse 106, respiratory rate 18. His temperature and  saturations are  pending.  GENERAL: The patient is a pleasant white male in mild distress secondary to  his nasal packing.  HEENT: Head is normocephalic and atraumatic. Pupils equal, round, and  reactive to light. His extraocular motions appear intact. His mucous  membranes are moist with obvious blood secondary to epistaxis. He does have  slight left eye cellulitis, but is able to open the eye.  NECK: Supple. There is no JVD. No lymph nodes, bruits, or thyromegaly.  CHEST: Clear to auscultation. No rales, rhonchi, or wheezes.  CARDIOVASCULAR: Mildly tachycardia with positive S1 and S2. No S3, S4,  murmurs, rubs, or gallops.  ABDOMEN: Stigmata of liver disease. He has a positive everted umbilicus. It  does not appear to be herniated. There is no fluid wave. Positive bowel  sounds. Nontender.  EXTREMITIES: Trace edema particularly around the malleoli. There is 2+  pulses, but they are cool.  NEUROLOGIC: She is awake, alert, oriented, with a mild left eye cellulitis.  Cranial nerves II-XII intact. Extraocular motions appear to be intact. Power  is 5/5. Deep tendon reflexes are 2+.  There is no pain on straight leg  raising.   Laboratory values reveal white count of 15.2, hemoglobin 11.1, hematocrit  31.9, and platelet count 326,000. Sodium 138, potassium 3.8, chloride 106,  CO2 24, BUN 23, creatinine 0.9, glucose 133.  LFTs within normal limits.  PTT 26, PT-INR 13 and 1.0 respectively. There is a chest x-ray pending.  EKG  reveals sinus tachycardia with no ST-T wave changes. However, one lead is  missing and we will repeat this.   ASSESSMENT/PLAN:  This is a 60 year old white male with intractable  epistaxis requiring cautery and packing. The patient has associated  hypertension. He relates using higher than prescribed dose of Sudafed which  may have incited an already underlying condition.  1. Hypertension. Currently, his blood pressure is mildly elevated. We will    start him on hydrochlorothiazide  tonight and consider a beta blocker     versus ACE in the morning depending on his vital signs. Should he     continue to be hypertensive or tachycardic, he should be moved to a     telemetry bed. At this time he is stable and will not require  being     moved. The patient did mention that he has rheumatic fever history with     mitral regurgitation, but he did not relate having a PE in 2002. The old     records do indicate that this study showed a normal ejection fraction of     60% and his left ventricular function within normal limits. He was     determined moderate aortic insufficiency, mild mitral regurgitation, and     moderate LAE. He was seen by Dr. Randa Evens for this study. I would     recommend checking a 2-D echo which I have taken the liberty of ordering     and repeat his EKG in the a.m. and obviously hold his aspirin.  2. Increasing abdominal girth. The patient has cor pulmonale. Liver disease     is less likely secondary to his normal LFTs and the stigmata of liver     disease. I would be more concerned about cardiac disease in this setting.     We are going to check TSH. Recommend checking a ultrasound of the liver     and abdomen to show that there is no active ascites involved here.  3. Lower extremity signs, again, versus right heart failure versus chronic     deep venous thrombosis. I would recommend an ultrasound of the lower     extremities.  Will check echo and TSH as indicated above.  4. Patient relates difficulty urinating. Will check a PSA.  5. Pulmonary. The patient is quite concerned over exposure to dead pigeons     and feathers. I agree with checking a chest x-ray. His risks for exposure     at this time include Chlamydia and cryptococcal pneumonia. At present he     is     asymptomatic with regard to pneumonia. We will follow up with chest x-ray     and follow his clinical exam, and add antibiotics as necessary.   Thank you for asking [place] to see your patient. We  will follow with you and  titrate his medications as appropriate.                                               Melissa L. Ladona Ridgel, MD    MLT/MEDQ  D:  10/08/2003  T:  10/10/2003  Job:  [id number]   cc:   Zola Button T. Lazarus Salines, M.D.  [address] [place]  [place] [address]  Fax: [phone]

## 2010-11-17 NOTE — Op Note (Signed)
[place]. [facility]  Patient:    [name], [name] Visit Number: [id number] MRN: [id number]          Service Type: END Location: ENDO Attending Physician:  Corliss Marcus Dictated by:   Francisca December, M.D. Proc. Date: 06/30/01 Admit Date:  06/30/2001   CC:         Oley Balm. Georgina Pillion, M.D.   Operative Report  PROCEDURE:  Transesophageal echocardiogram.  INDICATION:  Arren Laminack is a 60 year old man recently diagnosed with evidence of heart failure.  A transthoracic 2-D echocardiogram has suggested significant aortic insufficiency in the setting of normal LV size and systolic function.  He is brought now for transesophageal echocardiography to identify more clearly the extent of the ______ and determine whether this might be the etiology of his heart failure.  PROCEDURAL NOTE:  The probe was introduced atraumatically following posterior pharyngeal topical anesthesia using 20% benzocaine.  Heart rate, blood pressure, O2 saturation, and ECG were monitored throughout and remained stable.  Adequate echocardiographic images were obtained.  FINDINGS:  The right and left ventricles are normal size and show normal systolic contraction pattern.  No regional wall motion abnormalities are noted.  Septal and posterior wall thicknesses are normal.  The left atrium is very mildly enlarged.  The right atrium is rather small.  The aortic valve is thin, pliable, trileaflet.  It appears to have normal architecture.  No thickening.  Opens adequately.  The mitral valve is thin, pliable, has adequate diastolic excursion, no prolapse or redundancy.  The tricuspid valve was not well-visualized.  The pulmonic valve appears normal.  Color-flow Doppler studies reveal mild mitral regurgitation and moderate aortic insufficiency, as judged by the width of the jet in the left ventricular outflow tract and its extent into the left ventricle.  Inner atrial septum appears intact.  No  significant tricuspid regurgitation was seen nor was there pulmonic insufficiency.  FINAL IMPRESSION: 1. Intact left ventricular size and global systolic function.  Ejection    fraction approximately 60%. 2. Moderate aortic insufficiency. 3. Mild mitral regurgitation. 4. Mild left atrial enlargement. Dictated by:   Francisca December, M.D. Attending Physician:  Corliss Marcus DD:  06/30/01 TD:  06/30/01 Job: [id number]

## 2011-03-03 ENCOUNTER — Emergency Department (HOSPITAL_COMMUNITY): Payer: Self-pay

## 2011-03-03 ENCOUNTER — Emergency Department (HOSPITAL_COMMUNITY)
Admission: EM | Admit: 2011-03-03 | Discharge: 2011-03-03 | Disposition: A | Discharge status: Home / Self Care | Payer: Self-pay | Attending: Emergency Medicine | Admitting: Emergency Medicine

## 2011-03-03 DIAGNOSIS — M79609 Pain in unspecified limb: Secondary | ICD-10-CM | POA: Insufficient documentation

## 2011-03-03 DIAGNOSIS — R011 Cardiac murmur, unspecified: Secondary | ICD-10-CM | POA: Insufficient documentation

## 2011-03-03 DIAGNOSIS — E7800 Pure hypercholesterolemia, unspecified: Secondary | ICD-10-CM | POA: Insufficient documentation

## 2011-03-03 DIAGNOSIS — R079 Chest pain, unspecified: Secondary | ICD-10-CM | POA: Insufficient documentation

## 2011-03-03 DIAGNOSIS — I10 Essential (primary) hypertension: Secondary | ICD-10-CM | POA: Insufficient documentation

## 2011-03-03 LAB — POCT I-STAT TROPONIN I: Troponin i, poc: 0 ng/mL (ref 0.00–0.08)

## 2011-03-03 LAB — DIFFERENTIAL
Basophils Absolute: 0.1 K/uL (ref 0.0–0.1)
Basophils Relative: 1 % (ref 0–1)
Eosinophils Absolute: 0.2 10*3/uL (ref 0.0–0.7)
Eosinophils Relative: 2 % (ref 0–5)
Lymphocytes Relative: 22 % (ref 12–46)
Lymphs Abs: 1.6 K/uL (ref 0.7–4.0)
Monocytes Absolute: 0.5 K/uL (ref 0.1–1.0)
Monocytes Relative: 7 % (ref 3–12)
Neutro Abs: 5 K/uL (ref 1.7–7.7)
Neutrophils Relative %: 68 % (ref 43–77)

## 2011-03-03 LAB — CBC
HCT: 36.5 % — ABNORMAL LOW (ref 39.0–52.0)
Hemoglobin: 12.3 g/dL — ABNORMAL LOW (ref 13.0–17.0)
MCH: 29.8 pg (ref 26.0–34.0)
MCHC: 33.7 g/dL (ref 30.0–36.0)
MCV: 88.4 fL (ref 78.0–100.0)
Platelets: 262 10*3/uL (ref 150–400)
RBC: 4.13 MIL/uL — ABNORMAL LOW (ref 4.22–5.81)
RDW: 13.6 % (ref 11.5–15.5)
WBC: 7.3 K/uL (ref 4.0–10.5)

## 2011-03-03 LAB — POCT I-STAT, CHEM 8
BUN: 19 mg/dL (ref 6–23)
Calcium, Ion: 1.09 mmol/L — ABNORMAL LOW (ref 1.12–1.32)
Chloride: 101 meq/L (ref 96–112)
Creatinine, Ser: 1 mg/dL (ref 0.50–1.35)
Glucose, Bld: 160 mg/dL — ABNORMAL HIGH (ref 70–99)
HCT: 38 % — ABNORMAL LOW (ref 39.0–52.0)
Hemoglobin: 12.9 g/dL — ABNORMAL LOW (ref 13.0–17.0)
Potassium: 3.9 meq/L (ref 3.5–5.1)
Sodium: 137 mEq/L (ref 135–145)
TCO2: 28 mmol/L (ref 0–100)

## 2011-03-03 LAB — CK TOTAL AND CKMB (NOT AT ARMC)
CK, MB: 3.9 ng/mL (ref 0.3–4.0)
Relative Index: 2.5 (ref 0.0–2.5)
Total CK: 158 U/L (ref 7–232)

## 2011-03-06 ENCOUNTER — Telehealth: Payer: Self-pay | Admitting: Cardiovascular Disease

## 2011-03-06 ENCOUNTER — Inpatient Hospital Stay (INDEPENDENT_AMBULATORY_CARE_PROVIDER_SITE_OTHER)
Admission: RE | Admit: 2011-03-06 | Discharge: 2011-03-06 | Disposition: A | Discharge status: Home / Self Care | Payer: Self-pay | Source: Ambulatory Visit | Attending: Family Medicine | Admitting: Family Medicine

## 2011-03-06 DIAGNOSIS — R079 Chest pain, unspecified: Secondary | ICD-10-CM

## 2011-03-06 LAB — POCT I-STAT, CHEM 8
BUN: 14 mg/dL (ref 6–23)
Calcium, Ion: 1.08 mmol/L — ABNORMAL LOW (ref 1.12–1.32)
Chloride: 101 mEq/L (ref 96–112)
Creatinine, Ser: 1.1 mg/dL (ref 0.50–1.35)
Glucose, Bld: 182 mg/dL — ABNORMAL HIGH (ref 70–99)
TCO2: 27 mmol/L (ref 0–100)

## 2011-03-06 NOTE — Telephone Encounter (Signed)
Pt's wife called to change the stress test to the "medical" stress test because the pt is "off balanced" and she's afraid he will fall, however he is just scheduled for a eph fu appt 9-14, so now she wants to schedule one, does he want to order that?

## 2011-03-06 NOTE — Telephone Encounter (Signed)
Patient's wife states patient has been in the ER and has a F/U visit with Dr. Excell Seltzer on 03/16/11. She thinks patient has a blockage in his heart and needs a stress Myoview, drug induce not a walking  stress test, because he is off balance. Wife aware will send this message to MD.

## 2011-03-16 ENCOUNTER — Encounter: Payer: Self-pay | Admitting: *Deleted

## 2011-03-16 ENCOUNTER — Encounter: Payer: Self-pay | Admitting: Cardiovascular Disease

## 2011-03-16 ENCOUNTER — Ambulatory Visit (INDEPENDENT_AMBULATORY_CARE_PROVIDER_SITE_OTHER): Payer: Self-pay | Admitting: Cardiovascular Disease

## 2011-03-16 DIAGNOSIS — R079 Chest pain, unspecified: Secondary | ICD-10-CM | POA: Insufficient documentation

## 2011-03-16 DIAGNOSIS — R0609 Other forms of dyspnea: Secondary | ICD-10-CM

## 2011-03-16 DIAGNOSIS — R0989 Other specified symptoms and signs involving the circulatory and respiratory systems: Secondary | ICD-10-CM

## 2011-03-16 DIAGNOSIS — I351 Nonrheumatic aortic (valve) insufficiency: Secondary | ICD-10-CM

## 2011-03-16 DIAGNOSIS — I359 Nonrheumatic aortic valve disorder, unspecified: Secondary | ICD-10-CM

## 2011-03-16 MED ORDER — QUINAPRIL HCL 20 MG PO TABS: 20.0000 mg | ORAL_TABLET | Freq: Every day | ORAL | Status: DC

## 2011-03-16 MED ORDER — POTASSIUM CHLORIDE 10 MEQ PO TBCR: 10.0000 meq | EXTENDED_RELEASE_TABLET | Freq: Every day | ORAL | Status: DC

## 2011-03-16 MED ORDER — FUROSEMIDE 40 MG PO TABS: 40.0000 mg | ORAL_TABLET | Freq: Every day | ORAL | Status: DC

## 2011-03-16 NOTE — Progress Notes (Signed)
HPI:  This is a 60 year old gentleman presented for initial evaluation of chest pain and shortness of breath. The patient has a background of hyperlipidemia and very strong family history of coronary artery disease with multiple siblings having coronary bypass surgery at a young age. He complains of marked fatigue, shortness of breath, and left arm pain. Other than shortness of breath with exertion, his other symptoms are not clearly exertional. The patient has been evaluated in the emergency room for these symptoms and he ruled out for myocardial infarction with negative enzymes. His medical followup has been limited because of financial concerns. The patient does not have health insurance and he has limited income.  He and his wife are very concerned as he's had a marked change in the way he feels over the past few months. He was evaluated about one year ago with neurologic symptoms and there was concern about a TIA. He's had transient left leg weakness but there has not been a clear diagnosis made.  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 He describes left arm discomfort with this activity. There is a tightness and pain under his left axilla and to the left upper arm.  Hospital records have been reviewed and the patient had a transesophageal echo about 10 years ago to evaluate aortic insufficiency. This demonstrated normal cardiac chamber size and moderate AI. He has not had further followup.   Outpatient Encounter Prescriptions as of 03/16/2011  Medication Sig Dispense Refill  . aspirin 325 MG tablet Take 325 mg by mouth daily.        Marland Kitchen atorvastatin (LIPITOR) 20 MG tablet Take 20 mg by mouth daily.        . furosemide (LASIX) 40 MG tablet Take 1 tablet (40 mg total) by mouth daily.  30 tablet  6  . gabapentin (NEURONTIN) 300 MG capsule Take 300 mg by mouth 4 (four) times daily.        . potassium chloride (KLOR-CON) 10 MEQ CR tablet Take 1 tablet (10 mEq total) by mouth  daily.  30 tablet  6  . quinapril (ACCUPRIL) 20 MG tablet Take 1 tablet (20 mg total) by mouth at bedtime.  30 tablet  6  . terazosin (HYTRIN) 5 MG capsule Take 5 mg by mouth at bedtime.        Marland Kitchen DISCONTD: furosemide (LASIX) 40 MG tablet Take 40 mg by mouth daily.        Marland Kitchen DISCONTD: potassium chloride (KLOR-CON) 10 MEQ CR tablet Take 10 mEq by mouth daily.        Marland Kitchen DISCONTD: quinapril (ACCUPRIL) 20 MG tablet Take 20 mg by mouth at bedtime.        Marland Kitchen DISCONTD: ISOtretinoin (ACCUTANE) 20 MG capsule Take 20 mg by mouth daily.          Percocet  Past Medical History  Diagnosis Date  . Emphysema   . Leaky heart valve   . Fatty liver   . TIA (transient ischemic attack)   . Hypertension     No past surgical history on file.  History   Social History  . Marital Status: Married    Spouse Name: N/A    Number of Children: N/A  . Years of Education: N/A   Occupational History  . Not on file.   Social History Main Topics  . Smoking status: Never Smoker   . Smokeless tobacco: Not on file  . Alcohol Use: Not on file  .  Drug Use: No  . Sexually Active: Not on file   Other Topics Concern  . Not on file   Social History Narrative  . No narrative on file    Family History  Problem Relation Age of Onset  . Emphysema Mother   . Coronary artery disease Brother   . Coronary artery disease Brother   . Coronary artery disease Sister     ROS: General: no fevers/chills/night sweats Eyes: no blurry vision, diplopia, or amaurosis ENT: no sore throat or hearing loss Resp: no cough, wheezing, or hemoptysis CV: no edema or palpitations GI: no abdominal pain, nausea, vomiting, diarrhea, or constipation GU: no dysuria, frequency, or hematuria Skin: no rash Neuro: no headache, numbness, tingling, or weakness of extremities Musculoskeletal: no joint pain or swelling Heme: no bleeding, DVT, or easy bruising Endo: no polydipsia or polyuria  BP 118/78  Pulse 83  Ht 5\' 7"  (1.702 m)  Wt  225 lb (102.059 kg)  BMI 35.24 kg/m2  PHYSICAL EXAM: Pt is alert and oriented, WD, WN, obese male in no distress. HEENT: normal Neck: JVP normal. Carotid upstrokes normal without bruits. No thyromegaly. Lungs: equal expansion, clear bilaterally CV: Apex is discrete and nondisplaced, RRR without murmur or gallop Abd: soft, NT, +BS, no bruit, no hepatosplenomegaly Back: no CVA tenderness Ext: no C/C/E        Femoral pulses 2+= without bruits        DP/PT pulses intact and = Skin: warm and dry without rash Neuro: CNII-XII intact             Strength intact = bilaterally  EKG:  Normal sinus rhythm with left anterior fascicular block heart rate 83 beats per minute  ASSESSMENT AND PLAN:

## 2011-03-16 NOTE — Assessment & Plan Note (Signed)
The patient has chest and left arm pain. He has a very strong risk profile especially strong family history of multivessel CAD. I think it is appropriate to proceed with a definitive evaluation utilizing cardiac catheterization plus or minus PCI. Risks, indication, and alternatives to this procedure were reviewed with the patient in detail and he understands and agrees to proceed. He will continue on aspirin, atorvastatin, Accupril until his procedure next week.

## 2011-03-16 NOTE — Assessment & Plan Note (Signed)
I don't appreciate an aortic insufficiency murmur on exam, but this is limited because of his body size. His heart sounds are somewhat distant. Will likely perform an aortogram at the time of cardiac catheter.

## 2011-03-16 NOTE — Patient Instructions (Addendum)
Your physician recommends that you schedule a follow-up appointment in:4 weeks with Dr. Cooper--April 18, 2011 at 11:15  Your physician has requested that you have a cardiac catheterization. Cardiac catheterization is used to diagnose and/or treat various heart conditions. Doctors may recommend this procedure for a number of different reasons. The most common reason is to evaluate chest pain. Chest pain can be a symptom of coronary artery disease (CAD), and cardiac catheterization can show whether plaque is narrowing or blocking your heart's arteries. This procedure is also used to evaluate the valves, as well as measure the blood flow and oxygen levels in different parts of your heart. For further information please visit [email or web address] Please follow instruction sheet, as given.

## 2011-03-16 NOTE — Telephone Encounter (Signed)
Pt. Saw Dr.Cooper on Sept. 14, 2012

## 2011-03-22 ENCOUNTER — Ambulatory Visit (HOSPITAL_COMMUNITY)
Admission: RE | Admit: 2011-03-22 | Discharge: 2011-03-22 | Disposition: A | Discharge status: Home / Self Care | Payer: Self-pay | Source: Ambulatory Visit | Attending: Cardiovascular Disease | Admitting: Cardiovascular Disease

## 2011-03-22 DIAGNOSIS — I2510 Atherosclerotic heart disease of native coronary artery without angina pectoris: Secondary | ICD-10-CM

## 2011-03-22 DIAGNOSIS — R5383 Other fatigue: Secondary | ICD-10-CM | POA: Insufficient documentation

## 2011-03-22 DIAGNOSIS — I359 Nonrheumatic aortic valve disorder, unspecified: Secondary | ICD-10-CM

## 2011-03-22 DIAGNOSIS — R5381 Other malaise: Secondary | ICD-10-CM | POA: Insufficient documentation

## 2011-03-22 DIAGNOSIS — R0989 Other specified symptoms and signs involving the circulatory and respiratory systems: Secondary | ICD-10-CM | POA: Insufficient documentation

## 2011-03-22 DIAGNOSIS — R0609 Other forms of dyspnea: Secondary | ICD-10-CM | POA: Insufficient documentation

## 2011-03-22 LAB — CBC
HCT: 36.4 % — ABNORMAL LOW (ref 39.0–52.0)
MCH: 29.2 pg (ref 26.0–34.0)
MCV: 88.6 fL (ref 78.0–100.0)
Platelets: 258 10*3/uL (ref 150–400)
RDW: 13.8 % (ref 11.5–15.5)

## 2011-03-22 LAB — BASIC METABOLIC PANEL
BUN: 16 mg/dL (ref 6–23)
CO2: 28 mEq/L (ref 19–32)
Calcium: 8.6 mg/dL (ref 8.4–10.5)
GFR calc non Af Amer: >60 mL/min (ref >60)
Glucose, Bld: 180 mg/dL — ABNORMAL HIGH (ref 70–99)
Sodium: 138 mEq/L (ref 135–145)

## 2011-03-23 NOTE — Cardiovascular Report (Signed)
[name], [name]               ACCOUNT NO.:  [id number]  MEDICAL RECORD NO.:  [id number]  LOCATION:  [facility]                         FACILITY:  [facility]  PHYSICIAN:  Veverly Fells. Excell Seltzer, MD  DATE OF BIRTH:  [birth year]  DATE OF PROCEDURE:  03/22/2011 DATE OF DISCHARGE:  03/22/2011                           CARDIAC CATHETERIZATION   PROCEDURE: 1. Left heart catheterization. 2. Selective coronary angiography. 3. Left ventricular angiography. 4. Aortic root angiography.  PROCEDURAL INDICATIONS:  Mr. Kopf is a 60 year old gentleman with multiple cardiac risk factors.  He has had multiple siblings who have needed coronary bypass surgery in an early age.  He has exertional dyspnea and has had episodes of progressive fatigue and intermittent chest pain.  We elected to proceed with cardiac catheterization to assess his coronary anatomy.  Risks and indications of the procedure were reviewed with the patient. Informed consent was obtained.  The right wrist was prepped, draped, and anesthetized with 1% lidocaine.  Using the modified Seldinger technique, a 5-French sheath was placed in the right radial artery.  Standard Judkins catheters were used for coronary angiography and left ventriculography.  An aortic root angiogram was performed because the patient has a history of aortic insufficiency.  The aortic valve was hard to cross and I ultimately used an AL-1 catheter to get across the valve.  There was no aortic stenosis.  The difficulty crossing was related to the angulation of the aortic valve with the catheter approaching from the innominate artery.  Left ventriculography was performed.  A pullback across the aortic valve was done.  PROCEDURAL FINDINGS:  Aortic pressure 151/93 with a mean of 118, left ventricular pressure 152/18.  Left ventriculography shows normal LV function and EF is 55%.  There is no mitral regurgitation.  Aortic root angiography shows mild diffuse dilatation  of the proximal ascending aorta and the root.  There is mild aortic insufficiency.  Coronary angiography:  The left main is patent.  There is no obstructive disease.  It divides into the LAD and left circumflex.  LAD:  The LAD is widely patent throughout its course.  There is no significant obstructive disease.  There is a moderate caliber first diagonal with mild irregularity at its origin.  Left circumflex:  The left circumflex is patent.  It gives off two OM branches where there is about 30% stenosis in both branches at their bifurcation point.  There is no high-grade disease throughout the left circumflex distribution.  Right coronary artery:  There is a mild eccentric plaque in the proximal right coronary artery that appears no more than 30% stenosed.  The vessel gives off a PDA and it is patent.  The mid and distal vessel are widely patent.  FINAL ASSESSMENT: 1. 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 2. Normal left ventricular function. 3. Mild aortic insufficiency.  PROCEDURAL RECOMMENDATIONS:  The patient does not appear to have significant cardiac disease.  He should continue with primary risk reduction measures.  He has significant obesity and needs to focus on lifestyle modification.     Veverly Fells. Excell Seltzer, MD     MDC/MEDQ  D:  03/22/2011  T:  03/22/2011  Job:  [id number]  Electronically Signed by Tonny Bollman MD on 03/23/2011 09:19:13 PM

## 2011-03-26 LAB — CBC
Hemoglobin: 11.6 — ABNORMAL LOW
Hemoglobin: 12.8 — ABNORMAL LOW
MCHC: 34
MCHC: 34.9
MCV: 86.7
MCV: 86.8
Platelets: 258
RBC: 3.83 — ABNORMAL LOW
RBC: 4.35
RDW: 14.2
RDW: 14.2
RDW: 14.3
WBC: 6.2

## 2011-03-26 LAB — COMPREHENSIVE METABOLIC PANEL
AST: 27
Albumin: 2.8 — ABNORMAL LOW
BUN: 12
CO2: 28
Calcium: 9
Chloride: 108
Creatinine, Ser: 0.81
Creatinine, Ser: 0.88
GFR calc Af Amer: >60
GFR calc Af Amer: >60
GFR calc non Af Amer: >60
Glucose, Bld: 126 — ABNORMAL HIGH
Potassium: 4.3
Sodium: 138
Total Bilirubin: 0.9
Total Protein: 6.4
Total Protein: 7.3

## 2011-03-26 LAB — DIFFERENTIAL
Basophils Absolute: 0.1
Basophils Relative: 2 — ABNORMAL HIGH
Lymphocytes Relative: 23
Neutro Abs: 4.5
Neutrophils Relative %: 69

## 2011-03-26 LAB — PROTIME-INR: Prothrombin Time: 13.1

## 2011-03-26 LAB — APTT: aPTT: 30

## 2011-03-30 LAB — COMPREHENSIVE METABOLIC PANEL
ALT: 132 — ABNORMAL HIGH
ALT: 213 — ABNORMAL HIGH
AST: 143 — ABNORMAL HIGH
AST: 49 — ABNORMAL HIGH
AST: 50 — ABNORMAL HIGH
Albumin: 2.6 — ABNORMAL LOW
Albumin: 3.2 — ABNORMAL LOW
Alkaline Phosphatase: 146 — ABNORMAL HIGH
Alkaline Phosphatase: 149 — ABNORMAL HIGH
BUN: 11
BUN: 8
CO2: 27
CO2: 29
Calcium: 8.3 — ABNORMAL LOW
Chloride: 100
Chloride: 95 — ABNORMAL LOW
Chloride: 97
Creatinine, Ser: 0.98
GFR calc Af Amer: >60
GFR calc Af Amer: >60
GFR calc Af Amer: >60
GFR calc non Af Amer: >60
GFR calc non Af Amer: >60
Glucose, Bld: 133 — ABNORMAL HIGH
Potassium: 4
Potassium: 4.5
Sodium: 134 — ABNORMAL LOW
Sodium: 137
Sodium: 137
Total Bilirubin: 0.6
Total Bilirubin: 1.4 — ABNORMAL HIGH
Total Protein: 7.9

## 2011-03-30 LAB — CULTURE, BLOOD (ROUTINE X 2)
Culture: NO GROWTH
Culture: NO GROWTH

## 2011-03-30 LAB — PROTIME-INR
INR: 1.1
Prothrombin Time: 14.1

## 2011-03-30 LAB — URINE CULTURE: Colony Count: >=100000

## 2011-03-30 LAB — B-NATRIURETIC PEPTIDE (CONVERTED LAB)
Pro B Natriuretic peptide (BNP): 104 — ABNORMAL HIGH
Pro B Natriuretic peptide (BNP): 52.5

## 2011-03-30 LAB — DIFFERENTIAL
Basophils Absolute: 0
Basophils Relative: 0
Eosinophils Absolute: 0
Eosinophils Relative: 0
Lymphocytes Relative: 7 — ABNORMAL LOW
Lymphs Abs: 0.9
Monocytes Absolute: 0.7
Monocytes Relative: 5
Neutro Abs: 12.1 — ABNORMAL HIGH
Neutrophils Relative %: 88 — ABNORMAL HIGH

## 2011-03-30 LAB — HEPATIC FUNCTION PANEL
ALT: 194 — ABNORMAL HIGH
ALT: 216 — ABNORMAL HIGH
AST: 132 — ABNORMAL HIGH
AST: 88 — ABNORMAL HIGH
Albumin: 2.7 — ABNORMAL LOW
Albumin: 3.1 — ABNORMAL LOW
Alkaline Phosphatase: 142 — ABNORMAL HIGH
Alkaline Phosphatase: 153 — ABNORMAL HIGH
Bilirubin, Direct: 0.4 — ABNORMAL HIGH
Indirect Bilirubin: 0.9
Total Bilirubin: 1.1
Total Bilirubin: 1.3 — ABNORMAL HIGH
Total Protein: 7.7

## 2011-03-30 LAB — CBC
HCT: 36 — ABNORMAL LOW
HCT: 38.3 — ABNORMAL LOW
Hemoglobin: 12.4 — ABNORMAL LOW
Hemoglobin: 12.4 — ABNORMAL LOW
Hemoglobin: 13.1
MCHC: 34.1
MCV: 87.5
MCV: 87.6
MCV: 88.3
Platelets: 303
Platelets: 308
RBC: 3.67 — ABNORMAL LOW
RBC: 4.1 — ABNORMAL LOW
RBC: 4.38
RDW: 13.3
RDW: 14.2
RDW: 14.4
WBC: 10.9 — ABNORMAL HIGH
WBC: 12.2 — ABNORMAL HIGH
WBC: 13.4 — ABNORMAL HIGH
WBC: 13.8 — ABNORMAL HIGH
WBC: 8.2

## 2011-03-30 LAB — URINALYSIS, ROUTINE W REFLEX MICROSCOPIC
Bilirubin Urine: NEGATIVE
Glucose, UA: NEGATIVE
Ketones, ur: NEGATIVE
Nitrite: NEGATIVE
Protein, ur: 100 — AB
Specific Gravity, Urine: 1.021
Urobilinogen, UA: 1
pH: 5.5

## 2011-03-30 LAB — CK TOTAL AND CKMB (NOT AT ARMC)
CK, MB: 0.9
CK, MB: 1
Relative Index: 0.1
Total CK: 546 — ABNORMAL HIGH
Total CK: 623 — ABNORMAL HIGH
Total CK: 745 — ABNORMAL HIGH

## 2011-03-30 LAB — URINE MICROSCOPIC-ADD ON

## 2011-03-30 LAB — WOUND CULTURE

## 2011-03-30 LAB — TROPONIN I
Troponin I: 0.04
Troponin I: 0.04

## 2011-03-30 LAB — COMPREHENSIVE METABOLIC PANEL WITH GFR
ALT: 235 — ABNORMAL HIGH
CO2: 28
Calcium: 9.1
Creatinine, Ser: 0.93
GFR calc non Af Amer: >60
Glucose, Bld: 142 — ABNORMAL HIGH

## 2011-03-30 LAB — LIPASE, BLOOD: Lipase: 25

## 2011-03-30 LAB — D-DIMER, QUANTITATIVE: D-Dimer, Quant: 0.42

## 2011-03-30 LAB — LIPID PANEL
HDL: 52
LDL Cholesterol: 145 — ABNORMAL HIGH
Triglycerides: 116
VLDL: 23

## 2011-03-30 LAB — CARDIAC PANEL(CRET KIN+CKTOT+MB+TROPI)
CK, MB: 1.6
Relative Index: 0.6

## 2011-03-30 LAB — GLUCOSE, CAPILLARY
Glucose-Capillary: 117 — ABNORMAL HIGH
Glucose-Capillary: 117 — ABNORMAL HIGH
Glucose-Capillary: 123 — ABNORMAL HIGH
Glucose-Capillary: 130 — ABNORMAL HIGH
Glucose-Capillary: 133 — ABNORMAL HIGH
Glucose-Capillary: 147 — ABNORMAL HIGH

## 2011-03-30 LAB — CULTURE, BLOOD (SINGLE)

## 2011-03-30 LAB — APTT: aPTT: 35

## 2011-03-30 LAB — HOMOCYSTEINE: Homocysteine: 10.3

## 2011-03-30 LAB — HEMOGLOBIN A1C: Hgb A1c MFr Bld: 6.6 — ABNORMAL HIGH

## 2011-03-30 LAB — ANA: Anti Nuclear Antibody(ANA): NEGATIVE

## 2011-04-06 LAB — RPR: RPR Ser Ql: NONREACTIVE

## 2011-04-06 LAB — COMPREHENSIVE METABOLIC PANEL
Albumin: 3.7
BUN: 16
Chloride: 102
Creatinine, Ser: 0.78
Glucose, Bld: 102 — ABNORMAL HIGH
Total Bilirubin: 0.8

## 2011-04-06 LAB — POCT URINALYSIS DIP (DEVICE)
Nitrite: NEGATIVE
Protein, ur: NEGATIVE
Specific Gravity, Urine: 1.02
Urobilinogen, UA: 0.2
pH: 6

## 2011-04-06 LAB — I-STAT 8, (EC8 V) (CONVERTED LAB)
Acid-Base Excess: 3 — ABNORMAL HIGH
BUN: 21
Bicarbonate: 29.6 — ABNORMAL HIGH
HCT: 43
Hemoglobin: 14.6
Operator id: 247071
Sodium: 137
TCO2: 31

## 2011-04-06 LAB — B-NATRIURETIC PEPTIDE (CONVERTED LAB): Pro B Natriuretic peptide (BNP): 30

## 2011-04-18 ENCOUNTER — Ambulatory Visit (INDEPENDENT_AMBULATORY_CARE_PROVIDER_SITE_OTHER): Payer: Self-pay | Admitting: Cardiovascular Disease

## 2011-04-18 ENCOUNTER — Encounter: Payer: Self-pay | Admitting: Cardiovascular Disease

## 2011-04-18 DIAGNOSIS — R079 Chest pain, unspecified: Secondary | ICD-10-CM

## 2011-04-18 MED ORDER — TERAZOSIN HCL 5 MG PO CAPS: 5.0000 mg | ORAL_CAPSULE | Freq: Every day | ORAL | Status: DC

## 2011-04-18 NOTE — Progress Notes (Signed)
HPI:  This is a 60 year old gentleman presenting for follow up evaluation. He initially presented with chest pain and shortness of breath. The patient has a background history of a very strong family risk profile for premature coronary atherosclerosis. He underwent cardiac catheterization for definitive evaluation. This demonstrated minor nonobstructive coronary disease and normal LV function.  The patient is doing fine from a symptomatic standpoint. He reports that he lost 5 pounds since his previous evaluation. He denies chest pain  But continues to have exertional shortness of breath. He denies edema, orthopnea, or PND.  Outpatient Encounter Prescriptions as of 04/18/2011  Medication Sig Dispense Refill  . aspirin 81 MG tablet Take 81 mg by mouth daily.        Marland Kitchen atorvastatin (LIPITOR) 20 MG tablet Take 20 mg by mouth daily.        . furosemide (LASIX) 40 MG tablet Take 1 tablet (40 mg total) by mouth daily.  30 tablet  6  . gabapentin (NEURONTIN) 300 MG capsule Take 300 mg by mouth 4 (four) times daily.        . potassium chloride (KLOR-CON) 10 MEQ CR tablet Take 1 tablet (10 mEq total) by mouth daily.  30 tablet  6  . quinapril (ACCUPRIL) 20 MG tablet Take 1 tablet (20 mg total) by mouth at bedtime.  30 tablet  6  . terazosin (HYTRIN) 5 MG capsule Take 5 mg by mouth at bedtime.        Marland Kitchen DISCONTD: aspirin 325 MG tablet Take 325 mg by mouth daily.          Allergies  Allergen Reactions  . Percocet (Oxycodone-Acetaminophen) Other (See Comments)    Hallucintaions    Past Medical History  Diagnosis Date  . Emphysema   . Leaky heart valve   . Fatty liver   . TIA (transient ischemic attack)   . Hypertension    BP 119/72  Pulse 70  Ht 5\' 8"  (1.727 m)  Wt 220 lb (99.791 kg)  BMI 33.45 kg/m2  PHYSICAL EXAM: Pt is alert and oriented, NAD HEENT: normal Neck: JVP - normal, carotids 2+= without bruits Lungs: CTA bilaterally CV: RRR without murmur or gallop Abd: soft, Obese,  nontender. Ext: no C/C/E, distal pulses intact and equal Skin: warm/dry no rash  ASSESSMENT AND PLAN:

## 2011-04-18 NOTE — Patient Instructions (Signed)
Your physician recommends that you continue on your current medications as directed. Please refer to the Current Medication list given to you today.   Your physician recommends that you schedule a follow-up appointment as needed  

## 2011-05-02 NOTE — Assessment & Plan Note (Signed)
The patient has mild nonobstructive CAD. Ongoing risk reduction measures are appropriate. I think he should continue on aspirin 81 mg daily, atorvastatin for lipid-lowering, and an ACE inhibitor for treatment of hypertension. He will continue to followup with his primary care physician I would be happy to see him back on an as needed basis.

## 2011-10-17 ENCOUNTER — Encounter (HOSPITAL_COMMUNITY): Payer: Self-pay | Admitting: Emergency Medicine

## 2011-10-17 ENCOUNTER — Emergency Department (HOSPITAL_COMMUNITY): Payer: Self-pay

## 2011-10-17 ENCOUNTER — Inpatient Hospital Stay (HOSPITAL_COMMUNITY)
Admission: EM | Admit: 2011-10-17 | Discharge: 2011-10-19 | DRG: 074 | Disposition: A | Discharge status: Home / Self Care | Payer: Self-pay | Attending: Internal Medicine | Admitting: Internal Medicine

## 2011-10-17 DIAGNOSIS — I10 Essential (primary) hypertension: Secondary | ICD-10-CM | POA: Diagnosis present

## 2011-10-17 DIAGNOSIS — M21372 Foot drop, left foot: Secondary | ICD-10-CM | POA: Diagnosis present

## 2011-10-17 DIAGNOSIS — I240 Acute coronary thrombosis not resulting in myocardial infarction: Secondary | ICD-10-CM

## 2011-10-17 DIAGNOSIS — I359 Nonrheumatic aortic valve disorder, unspecified: Secondary | ICD-10-CM | POA: Diagnosis present

## 2011-10-17 DIAGNOSIS — IMO0002 Reserved for concepts with insufficient information to code with codable children: Secondary | ICD-10-CM

## 2011-10-17 DIAGNOSIS — H6121 Impacted cerumen, right ear: Secondary | ICD-10-CM | POA: Diagnosis present

## 2011-10-17 DIAGNOSIS — Z8673 Personal history of transient ischemic attack (TIA), and cerebral infarction without residual deficits: Secondary | ICD-10-CM

## 2011-10-17 DIAGNOSIS — E1142 Type 2 diabetes mellitus with diabetic polyneuropathy: Secondary | ICD-10-CM | POA: Diagnosis present

## 2011-10-17 DIAGNOSIS — R0989 Other specified symptoms and signs involving the circulatory and respiratory systems: Secondary | ICD-10-CM | POA: Diagnosis present

## 2011-10-17 DIAGNOSIS — E1065 Type 1 diabetes mellitus with hyperglycemia: Secondary | ICD-10-CM

## 2011-10-17 DIAGNOSIS — G629 Polyneuropathy, unspecified: Secondary | ICD-10-CM | POA: Diagnosis present

## 2011-10-17 DIAGNOSIS — R0609 Other forms of dyspnea: Secondary | ICD-10-CM | POA: Diagnosis present

## 2011-10-17 DIAGNOSIS — E1149 Type 2 diabetes mellitus with other diabetic neurological complication: Principal | ICD-10-CM | POA: Diagnosis present

## 2011-10-17 DIAGNOSIS — R739 Hyperglycemia, unspecified: Secondary | ICD-10-CM | POA: Diagnosis present

## 2011-10-17 DIAGNOSIS — M216X9 Other acquired deformities of unspecified foot: Secondary | ICD-10-CM | POA: Diagnosis present

## 2011-10-17 DIAGNOSIS — R0600 Dyspnea, unspecified: Secondary | ICD-10-CM | POA: Diagnosis present

## 2011-10-17 HISTORY — DX: Rheumatic fever without heart involvement: I00

## 2011-10-17 HISTORY — DX: Acute coronary thrombosis not resulting in myocardial infarction: I24.0

## 2011-10-17 HISTORY — DX: Polyneuropathy, unspecified: G62.9

## 2011-10-17 HISTORY — DX: Localized edema: R60.0

## 2011-10-17 HISTORY — DX: Unspecified abdominal hernia without obstruction or gangrene: K46.9

## 2011-10-17 HISTORY — DX: Impacted cerumen, unspecified ear: H61.20

## 2011-10-17 HISTORY — DX: Other difficulties with micturition: R39.198

## 2011-10-17 HISTORY — DX: Hyperlipidemia, unspecified: E78.5

## 2011-10-17 LAB — GLUCOSE, CAPILLARY
Glucose-Capillary: 340 mg/dL — ABNORMAL HIGH (ref 70–99)
Glucose-Capillary: 297 mg/dL — ABNORMAL HIGH (ref 70–99)
Glucose-Capillary: 217 mg/dL — ABNORMAL HIGH (ref 70–99)

## 2011-10-17 LAB — CARDIAC PANEL(CRET KIN+CKTOT+MB+TROPI)
CK, MB: 3 ng/mL (ref 0.3–4.0)
Relative Index: INVALID (ref 0.0–2.5)
Total CK: 74 U/L (ref 7–232)

## 2011-10-17 LAB — COMPREHENSIVE METABOLIC PANEL
ALT: 22 U/L (ref 0–53)
Alkaline Phosphatase: 127 U/L — ABNORMAL HIGH (ref 39–117)
CO2: 24 mEq/L (ref 19–32)
Calcium: 8.7 mg/dL (ref 8.4–10.5)
GFR calc Af Amer: >90 mL/min (ref >90)
GFR calc non Af Amer: >90 mL/min (ref >90)
Glucose, Bld: 681 mg/dL (ref 70–99)
Potassium: 3.6 mEq/L (ref 3.5–5.1)
Sodium: 128 mEq/L — ABNORMAL LOW (ref 135–145)

## 2011-10-17 LAB — URINALYSIS, ROUTINE W REFLEX MICROSCOPIC
Bilirubin Urine: NEGATIVE
Glucose, UA: >1000 mg/dL — AB
Hgb urine dipstick: NEGATIVE
Specific Gravity, Urine: 1.034 — ABNORMAL HIGH (ref 1.005–1.030)
Urobilinogen, UA: 0.2 mg/dL (ref 0.0–1.0)
pH: 6 (ref 5.0–8.0)

## 2011-10-17 LAB — DIFFERENTIAL
Eosinophils Relative: 1 % (ref 0–5)
Lymphocytes Relative: 16 % (ref 12–46)
Lymphs Abs: 1.3 10*3/uL (ref 0.7–4.0)
Monocytes Relative: 3 % (ref 3–12)

## 2011-10-17 LAB — CBC
Hemoglobin: 12.8 g/dL — ABNORMAL LOW (ref 13.0–17.0)
MCV: 89.4 fL (ref 78.0–100.0)
Platelets: 300 10*3/uL (ref 150–400)
RBC: 4.32 MIL/uL (ref 4.22–5.81)
WBC: 7.9 10*3/uL (ref 4.0–10.5)

## 2011-10-17 LAB — MAGNESIUM: Magnesium: 1.8 mg/dL (ref 1.5–2.5)

## 2011-10-17 LAB — VITAMIN B12: Vitamin B-12: 537 pg/mL (ref 211–911)

## 2011-10-17 LAB — HEMOGLOBIN A1C: Hgb A1c MFr Bld: 14.6 % — ABNORMAL HIGH (ref <5.7)

## 2011-10-17 MED ORDER — ACETAMINOPHEN 325 MG PO TABS
650.0000 mg | ORAL_TABLET | Freq: Four times a day (QID) | ORAL | Status: DC | PRN
  Administered 2011-10-18: 650 mg via ORAL
  Filled 2011-10-17: qty 2

## 2011-10-17 MED ORDER — QUINAPRIL HCL 10 MG PO TABS
20.0000 mg | ORAL_TABLET | Freq: Every day | ORAL | Status: DC
  Filled 2011-10-17: qty 2

## 2011-10-17 MED ORDER — ADULT MULTIVITAMIN W/MINERALS CH
1.0000 | ORAL_TABLET | Freq: Every day | ORAL | Status: DC
  Administered 2011-10-18 – 2011-10-19 (×2): 1 via ORAL
  Filled 2011-10-17 (×3): qty 1

## 2011-10-17 MED ORDER — SODIUM CHLORIDE 0.9 % IV SOLN
INTRAVENOUS | Status: DC
  Administered 2011-10-17: 75 mL/h via INTRAVENOUS
  Administered 2011-10-17 – 2011-10-18 (×2): via INTRAVENOUS

## 2011-10-17 MED ORDER — SODIUM CHLORIDE 0.9 % IV SOLN: INTRAVENOUS | Status: DC

## 2011-10-17 MED ORDER — SODIUM CHLORIDE 0.9 % IV SOLN
INTRAVENOUS | Status: DC
  Filled 2011-10-17: qty 1

## 2011-10-17 MED ORDER — DEXTROSE-NACL 5-0.45 % IV SOLN
INTRAVENOUS | Status: DC
  Administered 2011-10-17: 18:00:00 via INTRAVENOUS

## 2011-10-17 MED ORDER — DEXTROSE 50 % IV SOLN
25.0000 mL | INTRAVENOUS | Status: DC | PRN
  Filled 2011-10-17: qty 50

## 2011-10-17 MED ORDER — ALBUTEROL SULFATE (5 MG/ML) 0.5% IN NEBU: 2.5000 mg | INHALATION_SOLUTION | RESPIRATORY_TRACT | Status: DC | PRN

## 2011-10-17 MED ORDER — GABAPENTIN 300 MG PO CAPS
300.0000 mg | ORAL_CAPSULE | Freq: Four times a day (QID) | ORAL | Status: DC
  Administered 2011-10-17 – 2011-10-19 (×7): 300 mg via ORAL
  Filled 2011-10-17 (×13): qty 1

## 2011-10-17 MED ORDER — GUAIFENESIN-DM 100-10 MG/5ML PO SYRP: 5.0000 mL | ORAL_SOLUTION | ORAL | Status: DC | PRN

## 2011-10-17 MED ORDER — SIMVASTATIN 20 MG PO TABS
20.0000 mg | ORAL_TABLET | Freq: Every day | ORAL | Status: DC
  Administered 2011-10-18: 20 mg via ORAL
  Filled 2011-10-17 (×5): qty 1

## 2011-10-17 MED ORDER — TERAZOSIN HCL 5 MG PO CAPS
5.0000 mg | ORAL_CAPSULE | Freq: Every day | ORAL | Status: DC
  Administered 2011-10-18 – 2011-10-19 (×2): 5 mg via ORAL
  Filled 2011-10-17 (×3): qty 1

## 2011-10-17 MED ORDER — INSULIN REGULAR BOLUS VIA INFUSION
0.0000 [IU] | Freq: Three times a day (TID) | INTRAVENOUS | Status: DC
  Administered 2011-10-17: 5.6 [IU] via INTRAVENOUS
  Filled 2011-10-17: qty 10

## 2011-10-17 MED ORDER — GADOBENATE DIMEGLUMINE 529 MG/ML IV SOLN
20.0000 mL | Freq: Once | INTRAVENOUS | Status: AC | PRN
  Administered 2011-10-17: 20 mL via INTRAVENOUS

## 2011-10-17 MED ORDER — ACETAMINOPHEN 650 MG RE SUPP: 650.0000 mg | Freq: Four times a day (QID) | RECTAL | Status: DC | PRN

## 2011-10-17 MED ORDER — ASPIRIN EC 325 MG PO TBEC
325.0000 mg | DELAYED_RELEASE_TABLET | Freq: Every day | ORAL | Status: DC
  Administered 2011-10-18 – 2011-10-19 (×2): 325 mg via ORAL
  Filled 2011-10-17 (×3): qty 1

## 2011-10-17 MED ORDER — SODIUM CHLORIDE 0.9 % IJ SOLN
3.0000 mL | Freq: Two times a day (BID) | INTRAMUSCULAR | Status: DC
  Administered 2011-10-19: 3 mL via INTRAVENOUS

## 2011-10-17 MED ORDER — PNEUMOCOCCAL VAC POLYVALENT 25 MCG/0.5ML IJ INJ
0.5000 mL | INJECTION | INTRAMUSCULAR | Status: AC
  Administered 2011-10-18: 0.5 mL via INTRAMUSCULAR
  Filled 2011-10-17: qty 0.5

## 2011-10-17 MED ORDER — MORPHINE SULFATE 2 MG/ML IJ SOLN: 2.0000 mg | INTRAMUSCULAR | Status: DC | PRN

## 2011-10-17 MED ORDER — INSULIN REGULAR BOLUS VIA INFUSION
0.0000 [IU] | Freq: Three times a day (TID) | INTRAVENOUS | Status: DC
  Filled 2011-10-17: qty 10

## 2011-10-17 MED ORDER — SODIUM CHLORIDE 0.9 % IV SOLN
INTRAVENOUS | Status: DC
  Administered 2011-10-17: 10 [IU]/h via INTRAVENOUS
  Administered 2011-10-18: 5.8 [IU]/h via INTRAVENOUS
  Administered 2011-10-18: 6.4 [IU]/h via INTRAVENOUS
  Administered 2011-10-19: 01:00:00 via INTRAVENOUS
  Filled 2011-10-17 (×2): qty 1

## 2011-10-17 MED ORDER — ENOXAPARIN SODIUM 40 MG/0.4ML ~~LOC~~ SOLN
40.0000 mg | SUBCUTANEOUS | Status: DC
  Filled 2011-10-17: qty 0.4

## 2011-10-17 MED ORDER — SODIUM CHLORIDE 0.9 % IV SOLN
INTRAVENOUS | Status: DC
  Administered 2011-10-17: 18:00:00 via INTRAVENOUS

## 2011-10-17 NOTE — ED Notes (Signed)
R foot 1+ edema, pt reports both feet swell daily and resolve with rest and elevation.

## 2011-10-17 NOTE — ED Notes (Signed)
Bed:WA17<BR> Expected date:<BR> Expected time:<BR> Means of arrival:<BR> Comments:<BR> EMS

## 2011-10-17 NOTE — ED Notes (Signed)
Per pt report, Pt states SOB since Sunday, Started "dragging L leg" Monday am. Pt denies N/V/D or CP. Pt denies injury or fall.

## 2011-10-17 NOTE — ED Notes (Signed)
Patient transported to X-ray 

## 2011-10-17 NOTE — ED Provider Notes (Signed)
History     CSN: [id number]  Arrival date & time 10/17/11  1345   First MD Initiated Contact with Patient 10/17/11 1500      Chief Complaint  Patient presents with  . Shortness of Breath  . Extremity Weakness     HPI Patient presents with chief complaint of dragging his left leg for last several days along with occasional shortness of breath.  Patient's had episodes of leg problems along with some speech problems in the past but they've usually resolved.  Patient had a questionable diagnosis of TIA in the past.  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  Patient has a positive history for hypertension and hyperlipidemia.  Patient has never consumed alcohol or smoke cigarettes or tobacco products. Past Medical History  Diagnosis Date  . Emphysema   . Leaky heart valve   . Fatty liver   . TIA (transient ischemic attack)   . Hypertension     History reviewed. No pertinent past surgical history.  Family History  Problem Relation Age of Onset  . Emphysema Mother   . Coronary artery disease Brother   . Coronary artery disease Brother   . Coronary artery disease Sister     History  Substance Use Topics  . Smoking status: Never Smoker   . Smokeless tobacco: Not on file  . Alcohol Use: No      Review of Systems  All other systems reviewed and are negative.    Allergies  Percocet  Home Medications   Current Outpatient Rx  Name Route Sig Dispense Refill  . ASPIRIN 81 MG PO TABS Oral Take 81 mg by mouth daily.      . ATORVASTATIN CALCIUM 20 MG PO TABS Oral Take 20 mg by mouth daily.      . FUROSEMIDE 40 MG PO TABS Oral Take 1 tablet (40 mg total) by mouth daily. 30 tablet 6  . GABAPENTIN 300 MG PO CAPS Oral Take 300 mg by mouth 4 (four) times daily.      Marland Kitchen POTASSIUM CHLORIDE 10 MEQ PO TBCR Oral Take 1 tablet (10 mEq total) by mouth daily. 30 tablet 6  . QUINAPRIL HCL 20 MG PO TABS Oral Take 20 mg by mouth daily.      Marland Kitchen TERAZOSIN HCL 5 MG PO CAPS Oral Take 5 mg by mouth daily.      BP 112/70  Pulse 76  Temp(Src) 98.2 F (36.8 C) (Oral)  Resp 20  SpO2 99%  Physical Exam  Nursing note and vitals reviewed. Constitutional: He is oriented to person, place, and time. He appears well-developed and well-nourished. No distress.  HENT:  Head: Normocephalic and atraumatic.  Eyes: Pupils are equal, round, and reactive to light.  Neck: Normal range of motion.  Cardiovascular: Normal rate and intact distal pulses.   Pulmonary/Chest: No respiratory distress. He has no wheezes.  Abdominal: Soft. Normal appearance and bowel sounds are normal. There is no tenderness. There is no rebound and no guarding.  Musculoskeletal: Normal range of motion.  Neurological: He is alert and oriented to person, place, and time. He has normal strength. No cranial nerve deficit or sensory deficit. GCS eye subscore is 4. GCS verbal subscore is 5. GCS motor subscore is 6.  Skin: Skin is warm and dry. No rash noted.  Psychiatric: He has a normal mood and affect. His behavior is normal.    ED Course  Procedures (including critical care time)  AG= 14 mEq/L   Scheduled Meds:   . insulin regular  0-10 Units Intravenous TID WC   Continuous Infusions:   . sodium chloride    . dextrose 5 % and 0.45% NaCl    . insulin (NOVOLIN-R) infusion     PRN Meds:.dextrose, gadobenate dimeglumine  Labs Reviewed  CBC - Abnormal; Notable for the following:    Hemoglobin 12.8 (*)    HCT 38.6 (*)    All other components within normal limits  DIFFERENTIAL - Abnormal; Notable for the following:    Neutrophils Relative 79 (*)    All other components within normal limits  COMPREHENSIVE METABOLIC PANEL - Abnormal; Notable for the following:    Sodium 128 (*)    Chloride 90 (*)    Glucose, Bld 681 (*)    Albumin 3.4 (*)    Alkaline Phosphatase 127 (*)    All other components within normal limits   Dg Chest 2 View  10/17/2011   *RADIOLOGY REPORT*  Clinical Data: Shortness of breath, cough  CHEST - 2 VIEW  Comparison: 03/03/2011  Findings: Chronic interstitial markings.  No frank interstitial edema.  Bilateral lower lobe scarring.  Stable cardiomegaly.  Mild degenerative changes of the visualized thoracolumbar spine.  IMPRESSION: Chronic interstitial markings.  No frank interstitial edema.  Stable cardiomegaly.  Original Report Authenticated By: Charline Bills, M.D.   Mr Laqueta Jean Wo Contrast  10/17/2011  *RADIOLOGY REPORT*  Clinical Data: 61 year old male with left lower extremity weakness, hypertension, TIA, shortness of breath.  MRI HEAD WITHOUT AND WITH CONTRAST  Technique:  Multiplanar, multiecho pulse sequences of the brain and surrounding structures were obtained according to standard protocol without and with intravenous contrast  Contrast: 20mL MULTIHANCE GADOBENATE DIMEGLUMINE 529 MG/ML IV SOLN  Comparison: Head CT 02/26/2008.  Findings: No restricted diffusion to suggest acute infarction.  No midline shift, mass effect, evidence of mass lesion, ventriculomegaly, extra-axial collection or acute intracranial hemorrhage.  Cervicomedullary junction and pituitary are within normal limits.  Major intracranial vascular flow voids are preserved.  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 Otherwise negative for age gray and white matter signal. No abnormal enhancement identified.  Negative visualized cervical spine.  Normal bone marrow signal. Fluid in the mastoid air cells greater on the left.  Negative nasopharynx except for a small retention cyst on the left.  Mild paranasal sinus mucosal thickening and small maxillary sinus retention cyst. Visualized orbit soft tissues are within normal limits.  Negative scalp soft tissues.  IMPRESSION: 1. No acute intracranial abnormality.  Mild for age chronic small vessel disease. 2.  Left greater than right mastoid effusions.  Mild paranasal sinus inflammatory changes.   Original Report Authenticated By: Harley Hallmark, M.D.     1. New onset type 1 diabetes mellitus, uncontrolled       MDM          Nelia Shi, MD 10/17/11 1734

## 2011-10-17 NOTE — ED Notes (Signed)
Pt returned from xray

## 2011-10-17 NOTE — H&P (Signed)
PCP:   [facility], [facility], MD   Chief Complaint:  Left foot drop  HPI: Patient is a 61 year old white male with past medical history significant for multiple TIA's and hypertension.  Per patient and wife for the past 2 days he has been dragging his left lower extremity. He thought he had his strokes he presented to the emergency room. He had MRI done that did not show acute event. He was over noted to have hyperglycemia. Patient complained of shortness of breath for the past few days. He also has a feeling in his chest just today of some indigestion. He has no real chest pain. 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 He complains of urinary frequency and increased thirst but no abdominal pain.  Review of Systems:  10 point review of system negative otherwise stated in the history of present illness.    Past Medical History  Diagnosis Date  . Emphysema   . Leaky heart valve   . Fatty liver   . TIA (transient ischemic attack)   . Hypertension    Medications: Prior to Admission medications   Medication Sig Start Date End Date Taking? Authorizing Provider  aspirin 81 MG tablet Take 81 mg by mouth daily.     Yes Historical Provider, MD  atorvastatin (LIPITOR) 20 MG tablet Take 20 mg by mouth daily.     Yes Historical Provider, MD  furosemide (LASIX) 40 MG tablet Take 1 tablet (40 mg total) by mouth daily. 03/16/11  Yes Tonny Bollman, MD  gabapentin (NEURONTIN) 300 MG capsule Take 300 mg by mouth 4 (four) times daily.     Yes Historical Provider, MD  potassium chloride (KLOR-CON) 10 MEQ CR tablet Take 1 tablet (10 mEq total) by mouth daily. 03/16/11  Yes Tonny Bollman, MD  quinapril (ACCUPRIL) 20 MG tablet Take 20 mg by mouth daily.   Yes Historical Provider, MD  terazosin (HYTRIN) 5 MG capsule Take 5 mg by mouth daily.   Yes Historical Provider, MD       Allergies  Allergen Reactions  . Percocet (Oxycodone-Acetaminophen) Other  (See Comments)    Hallucintaions    Social History:  Reports that he has never smoked. He does not have any smokeless tobacco history on file. He reports that he does not drink alcohol or use illicit drugs. The patient is married with no children. He works at [employer].   Family History: Family History  Problem Relation Age of Onset  . Emphysema Mother   . Coronary artery disease Brother   . Coronary artery disease Brother   . Coronary artery disease Sister     Physical Exam: Filed Vitals:   10/17/11 1415 10/17/11 1500 10/17/11 1600 10/17/11 1720  BP:    112/70  Pulse: 101 92 85 76  Temp:      TempSrc:      Resp: 20 18 16 20   SpO2: 96% 94% 91% 99%   General: Patient appears older than his stated age.  Does not seem to be in any acute distress. Cardiovascular: Regular rate rhythm. Lungs: Clear to auscultations bilaterally no wheezes rhonchi or rales. Abdomen: Soft nontender nondistended obese positive bowel sounds and positive for umbilical hernia. Extremities: Edema in the right lower extremity that is chronic per patient. Neuro exam: Strength 3-5 bilateral lower extremity. Cranial nerves 2-12 in tact.   Labs on Admission:   [facility] 10/17/11 1405  NA 128*  K  3.6  CL 90*  CO2 24  GLUCOSE 681*  BUN 15  CREATININE 0.89  CALCIUM 8.7  MG --  PHOS --    Basename 10/17/11 1405  AST 15  ALT 22  ALKPHOS 127*  BILITOT 0.3  PROT 7.4  ALBUMIN 3.4*   No results found for this basename: LIPASE:2,AMYLASE:2 in the last 72 hours  Basename 10/17/11 1405  WBC 7.9  NEUTROABS 6.2  HGB 12.8*  HCT 38.6*  MCV 89.4  PLT 300   No results found for this basename: CKTOTAL:3,CKMB:3,CKMBINDEX:3,TROPONINI:3 in the last 72 hours No results found for this basename: TSH,T4TOTAL,FREET3,T3FREE,THYROIDAB in the last 72 hours No results found for this basename: VITAMINB12:2,FOLATE:2,FERRITIN:2,TIBC:2,IRON:2,RETICCTPCT:2 in the last 72 hours  Radiological Exams on Admission: Dg  Chest 2 View  10/17/2011  *RADIOLOGY REPORT*  Clinical Data: Shortness of breath, cough  CHEST - 2 VIEW  Comparison: 03/03/2011  Findings: Chronic interstitial markings.  No frank interstitial edema.  Bilateral lower lobe scarring.  Stable cardiomegaly.  Mild degenerative changes of the visualized thoracolumbar spine.  IMPRESSION: Chronic interstitial markings.  No frank interstitial edema.  Stable cardiomegaly.  Original Report Authenticated By: Charline Bills, M.D.   Mr Laqueta Jean Wo Contrast  10/17/2011  *RADIOLOGY REPORT*  Clinical Data: 61 year old male with left lower extremity weakness, hypertension, TIA, shortness of breath.  MRI HEAD WITHOUT AND WITH CONTRAST  Technique:  Multiplanar, multiecho pulse sequences of the brain and surrounding structures were obtained according to standard protocol without and with intravenous contrast  Contrast: 20mL MULTIHANCE GADOBENATE DIMEGLUMINE 529 MG/ML IV SOLN  Comparison: Head CT 02/26/2008.  Findings: No restricted diffusion to suggest acute infarction.  No midline shift, mass effect, evidence of mass lesion, ventriculomegaly, extra-axial collection or acute intracranial hemorrhage.  Cervicomedullary junction and pituitary are within normal limits.  Major intracranial vascular flow voids are preserved.  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 Otherwise negative for age gray and white matter signal. No abnormal enhancement identified.  Negative visualized cervical spine.  Normal bone marrow signal. Fluid in the mastoid air cells greater on the left.  Negative nasopharynx except for a small retention cyst on the left.  Mild paranasal sinus mucosal thickening and small maxillary sinus retention cyst. Visualized orbit soft tissues are within normal limits.  Negative scalp soft tissues.  IMPRESSION: 1. No acute intracranial abnormality.  Mild for age chronic small vessel disease. 2.  Left greater than right mastoid effusions.  Mild paranasal sinus  inflammatory changes.  Original Report Authenticated By: Harley Hallmark, M.D.    Assessment/Plan .Hyperglycemia without ketosis/ New Diabetes mellitus Patient with new onset diabetes. Will check hemoglobin A1c. Patient is on a glucose stabilizer. Will start by mouth medications once blood sugars stabilize. Patient will be discharged to followup [facility]. Diabetes education.  Check fasting lipid panel.  Marland KitchenHTN (hypertension) Blood pressure is presently stable on current medication. Continue home medication.   .Foot drop, left Will ask physical therapy to evaluate patient. His MRI is negative. He may need to followup outpatient with neurology.    Marland KitchenDyspnea Patient's shortness of breath could be related to the new onset diabetes. Will get 2-D echo and check d-dimer. We'll also cycle cardiac markers.  Neuropathy: Continue gabapentin  History of TIA: MRI negative for acute stroke. Increase aspirin to 325 mg daily.   Time spent on this patient including examination and decision-making process: 50 minutes.  Carollee Massed [phone] 10/17/2011, 6:28 PM

## 2011-10-18 DIAGNOSIS — I359 Nonrheumatic aortic valve disorder, unspecified: Secondary | ICD-10-CM

## 2011-10-18 LAB — GLUCOSE, CAPILLARY
Glucose-Capillary: 158 mg/dL — ABNORMAL HIGH (ref 70–99)
Glucose-Capillary: 236 mg/dL — ABNORMAL HIGH (ref 70–99)
Glucose-Capillary: 241 mg/dL — ABNORMAL HIGH (ref 70–99)
Glucose-Capillary: 154 mg/dL — ABNORMAL HIGH (ref 70–99)
Glucose-Capillary: 200 mg/dL — ABNORMAL HIGH (ref 70–99)
Glucose-Capillary: 67 mg/dL — ABNORMAL LOW (ref 70–99)
Glucose-Capillary: 100 mg/dL — ABNORMAL HIGH (ref 70–99)
Glucose-Capillary: 243 mg/dL — ABNORMAL HIGH (ref 70–99)
Glucose-Capillary: 240 mg/dL — ABNORMAL HIGH (ref 70–99)
Glucose-Capillary: 253 mg/dL — ABNORMAL HIGH (ref 70–99)

## 2011-10-18 LAB — CBC
HCT: 36.6 % — ABNORMAL LOW (ref 39.0–52.0)
Hemoglobin: 12.4 g/dL — ABNORMAL LOW (ref 13.0–17.0)
MCH: 29.5 pg (ref 26.0–34.0)
MCHC: 33.9 g/dL (ref 30.0–36.0)
RBC: 4.2 MIL/uL — ABNORMAL LOW (ref 4.22–5.81)

## 2011-10-18 LAB — CARDIAC PANEL(CRET KIN+CKTOT+MB+TROPI)
CK, MB: 2.5 ng/mL (ref 0.3–4.0)
CK, MB: 2.8 ng/mL (ref 0.3–4.0)
Relative Index: INVALID (ref 0.0–2.5)
Total CK: 69 U/L (ref 7–232)
Total CK: 73 U/L (ref 7–232)
Troponin I: <0.3 ng/mL (ref <0.30)

## 2011-10-18 LAB — LIPID PANEL
Cholesterol: 135 mg/dL (ref 0–200)
HDL: 35 mg/dL — ABNORMAL LOW (ref >39)
Total CHOL/HDL Ratio: 3.9 RATIO
Triglycerides: 178 mg/dL — ABNORMAL HIGH (ref <150)
VLDL: 36 mg/dL (ref 0–40)

## 2011-10-18 LAB — BASIC METABOLIC PANEL
BUN: 16 mg/dL (ref 6–23)
CO2: 26 mEq/L (ref 19–32)
GFR calc non Af Amer: >90 mL/min (ref >90)
Glucose, Bld: 120 mg/dL — ABNORMAL HIGH (ref 70–99)
Potassium: 3.5 mEq/L (ref 3.5–5.1)

## 2011-10-18 MED ORDER — INSULIN GLARGINE 100 UNIT/ML ~~LOC~~ SOLN
10.0000 [IU] | Freq: Once | SUBCUTANEOUS | Status: AC
  Administered 2011-10-18: 10 [IU] via SUBCUTANEOUS

## 2011-10-18 MED ORDER — GLIPIZIDE ER 10 MG PO TB24
10.0000 mg | ORAL_TABLET | Freq: Every day | ORAL | Status: DC
  Administered 2011-10-18 – 2011-10-19 (×2): 10 mg via ORAL
  Filled 2011-10-18 (×3): qty 1

## 2011-10-18 MED ORDER — LIVING WELL WITH DIABETES BOOK
Freq: Once | Status: DC
  Filled 2011-10-18: qty 1

## 2011-10-18 MED ORDER — INSULIN ASPART 100 UNIT/ML ~~LOC~~ SOLN: 0.0000 [IU] | Freq: Three times a day (TID) | SUBCUTANEOUS | Status: DC

## 2011-10-18 MED ORDER — LISINOPRIL 20 MG PO TABS
20.0000 mg | ORAL_TABLET | Freq: Every day | ORAL | Status: DC
  Administered 2011-10-18 – 2011-10-19 (×2): 20 mg via ORAL
  Filled 2011-10-18 (×3): qty 1

## 2011-10-18 NOTE — Progress Notes (Signed)
*  PRELIMINARY RESULTS* Echocardiogram 2D Echocardiogram has been performed.  Glean Salen [facility] 10/18/2011, 10:55 AM

## 2011-10-18 NOTE — Progress Notes (Signed)
CBG checked prior to d/c'ing insulin gtt; result 215.  K Schorr NP made aware of increase in CBG.  Instructed to continue with insulin gtt per glucostablizer recommendations at present time, no Novolog correction coverage to be given at present time.  Will continue to monitor.

## 2011-10-18 NOTE — Progress Notes (Signed)
Pt's CBGs have been within target range x 4 consecutive hours.  K Schorr NP notified for basal insulin and correction insulin orders.  Orders received for Lantus; given as ordered.

## 2011-10-18 NOTE — Progress Notes (Signed)
   CARE MANAGEMENT NOTE 10/18/2011  Patient:  [name],[name]   Account Number:  [id number]  Date Initiated:  10/18/2011  Documentation initiated by:  Jiles Crocker  Subjective/Objective Assessment:   ADMITTED WITH TIA, NEW DM     Action/Plan:   PATIENT GOES TO THE [facility] FOR MEDICAL CARE   Anticipated DC Date:  10/25/2011   Anticipated DC Plan:  HOME/SELF CARE         Choice offered to / List presented to:             Status of service:  In process, will continue to follow Medicare Important Message given?   (If response is "NO", the following Medicare IM given date fields will be blank) Date Medicare IM given:   Date Additional Medicare IM given:    Discharge Disposition:    Per UR Regulation:  Reviewed for med. necessity/level of care/duration of stay  If discussed at Long Length of Stay Meetings, dates discussed:    Comments:  10/18/2011- B Mariapaula Krist RN, BSN, MHA

## 2011-10-18 NOTE — Evaluation (Signed)
Physical Therapy Evaluation Patient Details Name: [name] MRN: [id number] DOB: [birth year] Today's Date: 10/18/2011  Problem List:  Patient Active Problem List  Diagnoses  . Aortic insufficiency  . Chest pain  . Hyperglycemia without ketosis  . Diabetes mellitus  . HTN (hypertension)  . Foot drop, left  . Impacted cerumen of right ear  . Dyspnea  . History of TIA (transient ischemic attack)  . Neuropathy    Past Medical History:  Past Medical History  Diagnosis Date  . Emphysema   . Leaky heart valve   . Fatty liver   . TIA (transient ischemic attack)   . Hypertension   . Hyperlipidemia   . Neuropathy   . Diabetes mellitus 10/17/2011    newly dx today  . Slow urinary stream   . Rheumatic fever   . 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 Edema leg     right leg has leaky valve and right foot swells  . Excessive ear wax   . Hernia     near navel   Past Surgical History:  Past Surgical History  Procedure Date  . Lithotripsy     PT Assessment/Plan/Recommendation PT Assessment Clinical Impression Statement: Pt presents with diagnosis hyperglycemia without ketosis. Pt mobilizing well except for pain in R LE with activity-pt c/o burning in R foot. Do not anticipate any follow-up needs at discharge. Will attempt to follow-up on tomorrow to practice steps and assess ambulation with cane. Pt will benefit from skilled PT in acute setting to maximize independence and safety with basic functional mobility in preparatin for d/c home with wife.  PT Recommendation/Assessment: Patient will need skilled PT in the acute care venue PT Problem List: Decreased mobility;Decreased activity tolerance PT Therapy Diagnosis : Difficulty walking;Acute pain PT Plan PT Frequency: Min 3X/week PT Treatment/Interventions: Gait training;Stair training;Functional mobility training;Therapeutic activities;Therapeutic exercise;Patient/family education PT  Recommendation Follow Up Recommendations: No PT follow up Equipment Recommended:  (to be determined) PT Goals  Acute Rehab PT Goals PT Goal Formulation: With patient Time For Goal Achievement: 7 days Pt will Ambulate: >150 feet;with modified independence;with least restrictive assistive device PT Goal: Ambulate - Progress: Goal set today Pt will Go Up / Down Stairs: 3-5 stairs;with rail(s);with modified independence PT Goal: Up/Down Stairs - Progress: Goal set today  PT Evaluation Precautions/Restrictions  Precautions Precautions: Fall Restrictions Weight Bearing Restrictions: No Prior Functioning  Home Living Lives With: Spouse Available Help at Discharge: Family Type of Home: House Home Access: Stairs to enter Secretary/administrator of Steps: 3-4 Entrance Stairs-Rails: Right Home Layout: One level Home Adaptive Equipment: None Prior Function Level of Independence: Independent Able to Take Stairs?: Yes (some difficulty) Driving: No Vocation: Full time employment Comments: Learning disability Cognition Cognition Overall Cognitive Status: Appears within functional limits for tasks assessed/performed Arousal/Alertness: Awake/alert Orientation Level: Appears intact for tasks assessed Behavior During Session: [facility] for tasks performed Cognition - Other Comments: learning ability Sensation/Coordination Sensation Light Touch: Appears Intact Coordination Gross Motor Movements are Fluid and Coordinated: Yes Extremity Assessment RLE Assessment RLE Assessment: Within Functional Limits LLE Assessment LLE Assessment: Within Functional Limits Mobility (including Balance) Bed Mobility Bed Mobility: No Transfers Transfers: Yes Sit to Stand: 6: Modified independent (Device/Increase time) Stand to Sit: 6: Modified independent (Device/Increase time) Ambulation/Gait Ambulation/Gait: Yes Ambulation/Gait Assistance Details (indicate cue type and reason): Min-guard assist. c/o R  foot burning at midpoint of gait distance. No LOB.  Ambulation Distance (Feet): 125 Feet Assistive device:  None Gait Pattern: Antalgic  Posture/Postural Control Posture/Postural Control: No significant limitations Exercise    End of Session PT - End of Session Equipment Utilized During Treatment: Gait belt Activity Tolerance: Patient limited by pain Patient left: in chair;with call bell in reach General Behavior During Session: [facility] for tasks performed Cognition: [facility] for tasks performed  Rebeca Alert [facility] 10/18/2011, 3:33 PM [phone]

## 2011-10-18 NOTE — Progress Notes (Signed)
Subjective: Patient feels much better today.  No complaints.  Objective: Weight change:   Intake/Output Summary (Last 24 hours) at 10/18/11 1635 Last data filed at 10/18/11 1300  Gross per 24 hour  Intake 1607.5 ml  Output    951 ml  Net  656.5 ml    Filed Vitals:   10/18/11 1610  BP:   Pulse: 77  Temp:   Resp: 18  General: Patient appears older than his stated age. Does not seem to be in any acute distress.  Cardiovascular: Regular rate rhythm.  Lungs: Clear to auscultations bilaterally no wheezes rhonchi or rales.  Abdomen: Soft nontender nondistended obese positive bowel sounds and positive for umbilical hernia.  Extremities: Edema in the right lower extremity that is chronic per patient.    Lab Results: Reviewed  Studies/Results: Dg Chest 2 View  10/17/2011  *RADIOLOGY REPORT*  Clinical Data: Shortness of breath, cough  CHEST - 2 VIEW  Comparison: 03/03/2011  Findings: Chronic interstitial markings.  No frank interstitial edema.  Bilateral lower lobe scarring.  Stable cardiomegaly.  Mild degenerative changes of the visualized thoracolumbar spine.  IMPRESSION: Chronic interstitial markings.  No frank interstitial edema.  Stable cardiomegaly.  Original Report Authenticated By: Charline Bills, M.D.   Mr Laqueta Jean Wo Contrast  10/17/2011  *RADIOLOGY REPORT*  Clinical Data: 61 year old male with left lower extremity weakness, hypertension, TIA, shortness of breath.  MRI HEAD WITHOUT AND WITH CONTRAST  Technique:  Multiplanar, multiecho pulse sequences of the brain and surrounding structures were obtained according to standard protocol without and with intravenous contrast  Contrast: 20mL MULTIHANCE GADOBENATE DIMEGLUMINE 529 MG/ML IV SOLN  Comparison: Head CT 02/26/2008.  Findings: No restricted diffusion to suggest acute infarction.  No midline shift, mass effect, evidence of mass lesion, ventriculomegaly, extra-axial collection or acute intracranial hemorrhage.  Cervicomedullary  junction and pituitary are within normal limits.  Major intracranial vascular flow voids are preserved.  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 Otherwise negative for age gray and white matter signal. No abnormal enhancement identified.  Negative visualized cervical spine.  Normal bone marrow signal. Fluid in the mastoid air cells greater on the left.  Negative nasopharynx except for a small retention cyst on the left.  Mild paranasal sinus mucosal thickening and small maxillary sinus retention cyst. Visualized orbit soft tissues are within normal limits.  Negative scalp soft tissues.  IMPRESSION: 1. No acute intracranial abnormality.  Mild for age chronic small vessel disease. 2.  Left greater than right mastoid effusions.  Mild paranasal sinus inflammatory changes.  Original Report Authenticated By: Harley Hallmark, M.D.   Medications: Scheduled Meds:   . aspirin EC  325 mg Oral Daily  . gabapentin  300 mg Oral QID  . glipiZIDE  10 mg Oral Q breakfast  . insulin aspart  0-15 Units Subcutaneous TID WC  . insulin glargine  10 Units Subcutaneous Once  . insulin regular  0-10 Units Intravenous TID WC  . lisinopril  20 mg Oral Daily  . mulitivitamin with minerals  1 tablet Oral Daily  . pneumococcal 23 valent vaccine  0.5 mL Intramuscular Tomorrow-1000  . simvastatin  20 mg Oral QPC supper  . sodium chloride  3 mL Intravenous Q12H  . terazosin  5 mg Oral Daily  . DISCONTD: enoxaparin  40 mg Subcutaneous Q24H  . DISCONTD: insulin regular  0-10 Units Intravenous TID WC  . DISCONTD: quinapril  20 mg Oral Daily   Continuous Infusions:   . sodium  chloride 75 mL/hr at 10/17/11 2312  . insulin (NOVOLIN-R) infusion 9.8 Units/hr (10/18/11 1451)  . DISCONTD: sodium chloride 150 mL/hr at 10/17/11 1810  . DISCONTD: sodium chloride    . DISCONTD: dextrose 5 % and 0.45% NaCl Stopped (10/17/11 1809)  . DISCONTD: insulin (NOVOLIN-R) infusion 3.3 Units/hr (10/17/11 1830)   PRN  Meds:.acetaminophen, acetaminophen, albuterol, dextrose, gadobenate dimeglumine, guaiFENesin-dextromethorphan, morphine, DISCONTD: dextrose  Assessment/Plan: Hyperglycemia without ketosis (10/17/2011)/Diabetes mellitus (10/17/2011)  patient with new onset diabetes. Start him on Glucotrol XL 10 mg daily. He could most likely start metformin at the time of discharge. He'll followup with his PCP for further management of his diabetes.   HTN (hypertension) (10/17/2011) Patient continued on home medication of lisinopril.   Foot drop, left (10/17/2011) Patient has some pain in the extremity. Most likely  from diabetic neuropathy. Continue on Gabapentin. No furrther treatment recommended by physical therapy.   Dyspnea (10/17/2011) Resolved. 2-D echo within normal limits and d-dimer normal. Most likely related to the hyperglycemia.   History of TIA (transient ischemic attack) (10/17/2011) Continue aspirin   Neuropathy (10/17/2011)  continue gabapentin        LOS: 1 day   Carollee Massed Pager [phone] 10/18/2011, 4:35 PM

## 2011-10-18 NOTE — Progress Notes (Signed)
Inpatient Diabetes Program Recommendations  AACE/ADA: New Consensus Statement on Inpatient Glycemic Control (2009)  Target Ranges:  Prepandial:   less than 140 mg/dL      Peak postprandial:   less than 180 mg/dL (1-2 hours)      Critically ill patients:  140 - 180 mg/dL   Reason for Visit: Newly diagnosed DM  Patient is a 61 year old white male with past medical history significant for multiple TIA's and hypertension. Per patient and wife for the past 2 days he has been dragging his left lower extremity. He thought he had his strokes he presented to the emergency room. He had MRI done that did not show acute event. He was over noted to have hyperglycemia. Patient complained of shortness of breath for the past few days. He also has a feeling in his chest just today of some indigestion. He has no real chest pain. 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 He complains of urinary frequency and increased thirst but no abdominal pain. Pt states he drinks "lots of Pepsi-Colas, eats fast foods frequently and loves sweets.    Results for [name], [name] (MRN [id number]) as of 10/18/2011 16:52  Ref. Range 10/17/2011 14:50  Hemoglobin A1C Latest Range: <5.7 % 14.6 (H)  Results for [name], [name] (MRN [id number]) as of 10/18/2011 16:52  Ref. Range 10/17/2011 14:05 10/18/2011 02:30  Glucose Latest Range: 70-99 mg/dL 865 (HH) 784 (H)  Results for [name], [name] (MRN [id number]) as of 10/18/2011 16:52  Ref. Range 10/18/2011 09:19 10/18/2011 10:26 10/18/2011 11:33 10/18/2011 12:36 10/18/2011 13:46 10/18/2011 14:50 10/18/2011 15:27 10/18/2011 16:43  Glucose-Capillary Latest Range: 70-99 mg/dL 132 (H) 440 (H) 102 (H) 159 (H) 241 (H) 200 (H) 154 (H) 93   Inpatient Diabetes Program Recommendations Insulin - IV drip/GlucoStabilizer: GlucoStabilizer until criteria met for transitioning to SQ Insulin - Basal: Lantus 15 units QHS - Please give Lantus 1 hour prior to  discontinuation of drip Correction (SSI): moderate tidwc and hs Outpatient Referral: OP Diabetes Education consult for newly diagnosed DM  Note: Long discussion with pt regarding importance of controlling blood sugars at home.  Discussed diabetes process, diet, exercise, monitoring and follow-up with PCP for management of diabetes.  Pt seems eager to learn and states wife will be coming tonight and they will watch diabetes videos on pt ed channel.  Will order Living Well With Diabetes book and pt will need meter and supplies prescriptions.  Answered questions and will followup tomorrow morning.  Discussed with Irving Burton, RN.

## 2011-10-19 LAB — BASIC METABOLIC PANEL
Calcium: 8.8 mg/dL (ref 8.4–10.5)
GFR calc non Af Amer: >90 mL/min (ref >90)
Glucose, Bld: 100 mg/dL — ABNORMAL HIGH (ref 70–99)
Sodium: 137 mEq/L (ref 135–145)

## 2011-10-19 LAB — CBC
MCH: 29.6 pg (ref 26.0–34.0)
MCHC: 32.8 g/dL (ref 30.0–36.0)
Platelets: 291 10*3/uL (ref 150–400)
RDW: 13.4 % (ref 11.5–15.5)

## 2011-10-19 LAB — GLUCOSE, CAPILLARY
Glucose-Capillary: 107 mg/dL — ABNORMAL HIGH (ref 70–99)
Glucose-Capillary: 143 mg/dL — ABNORMAL HIGH (ref 70–99)
Glucose-Capillary: 149 mg/dL — ABNORMAL HIGH (ref 70–99)
Glucose-Capillary: 185 mg/dL — ABNORMAL HIGH (ref 70–99)
Glucose-Capillary: 201 mg/dL — ABNORMAL HIGH (ref 70–99)
Glucose-Capillary: 234 mg/dL — ABNORMAL HIGH (ref 70–99)

## 2011-10-19 MED ORDER — INSULIN GLARGINE 100 UNIT/ML ~~LOC~~ SOLN: 8.0000 [IU] | Freq: Every day | SUBCUTANEOUS | Status: DC

## 2011-10-19 MED ORDER — GLUCOSE BLOOD VI STRP: ORAL_STRIP | Status: DC

## 2011-10-19 MED ORDER — FREESTYLE LANCETS MISC: Status: DC

## 2011-10-19 MED ORDER — GLIPIZIDE ER 10 MG PO TB24: 10.0000 mg | ORAL_TABLET | Freq: Every day | ORAL | Status: DC

## 2011-10-19 MED ORDER — INSULIN ASPART 100 UNIT/ML ~~LOC~~ SOLN
0.0000 [IU] | SUBCUTANEOUS | Status: DC
  Administered 2011-10-19: 2 [IU] via SUBCUTANEOUS
  Administered 2011-10-19: 5 [IU] via SUBCUTANEOUS

## 2011-10-19 MED ORDER — INSULIN GLARGINE 100 UNIT/ML ~~LOC~~ SOLN
10.0000 [IU] | Freq: Every day | SUBCUTANEOUS | Status: DC
  Administered 2011-10-19: 10 [IU] via SUBCUTANEOUS

## 2011-10-19 NOTE — Progress Notes (Signed)
Talked to patient about follow up medical care - patient stated that he has an apt at the [facility] on Nov 20, 2011; Patient also stated that he fills his prescriptions through the [facility] program and [employer]. Questioned patient about the ability to get his prescriptions filled today - he stated that he would get his prescriptions filled. Abelino Derrick RN, BSN, [employer]

## 2011-10-19 NOTE — Progress Notes (Signed)
Physical Therapy Treatment Patient Details Name: [name] MRN: [id number] DOB: 1950-09-06 Today's Date: 10/19/2011 Time: 1478-2956 PT Time Calculation (min): 19 min  PT Assessment / Plan / Recommendation Comments on Treatment Session  Pt eager to D/C to home today.  Session focused on gait training with [facility] however pt felt he did not need it and won't use it.  Also, practiced stairs with one rail and SPC which pt admits helped him with desend.  Pt states he has a cane at home but feels he still will not use it even for stairs.  Pt also declines any HH services.    Follow Up Recommendations  No PT follow up    Equipment Recommendations  Other (comment) (Pt declines any equipment needs "I hve no insurance")    Frequency      Precautions / Restrictions     Pertinent Vitals/Pain     Mobility  Bed Mobility Details for Bed Mobility Assistance: Pt OOB in recliner Transfers Transfers: Sit to Stand Sit to Stand: 6: Modified independent (Device/Increase time) Stand to Sit: 6: Modified independent (Device/Increase time) Details for Transfer Assistance: Pt impulisive, VC's for safety and hand placement Ambulation/Gait Ambulation/Gait Assistance: 5: Supervision Ambulation Distance (Feet): 375 Feet Assistive device: Straight cane Ambulation/Gait Assistance Details: <25% VC's on proper sequencing using SPC and safety.  Pt repeated he feels he does not need it.  Pt had no LOB but did amb with shuffled, short step length w/ narrow BOS.   Gait Pattern: Step-through pattern;Shuffle;Decreased stride length Gait velocity: No c/o pain.  Pt felt better amb with his shoes vs hospital footies. General Gait Details: Pt amb from his room to the stairs, then back with no rest break.  "I feel good". Stairs: Yes Stairs Assistance: 4: Min guard Stair Management Technique: One rail Right;With cane (25% VC's on proper sequencing and safety.) Number of Stairs: 8     Exercises     PT Goals Acute Rehab  PT Goals PT Goal Formulation: With patient Potential to Achieve Goals: Good Pt will Ambulate: >150 feet;with modified independence;with least restrictive assistive device PT Goal: Ambulate - Progress: Partly met Pt will Go Up / Down Stairs: 3-5 stairs;with modified independence PT Goal: Up/Down Stairs - Progress: Partly met  Visit Information  Last PT Received On: 10/19/11 Assistance Needed: +1    Subjective Data  Subjective: Pt eager to go home today Patient Stated Goal: I want to go back to work   Cognition       Balance         Felecia Shelling  PTA [facility]  Acute  Rehab Pager     [phone]

## 2011-10-19 NOTE — Progress Notes (Signed)
Results for DEVON, PRETTY (MRN [id number]) as of 10/19/2011 14:02  Ref. Range 10/19/2011 03:46 10/19/2011 04:46 10/19/2011 05:50 10/19/2011 07:33 10/19/2011 12:28  Glucose-Capillary Latest Range: 70-99 mg/dL 409 (H) 811 (H) 914 (H) 201 (H) 234 (H)   Pt to be discharged on Lantus 8 units QHS and glipizide 10 mg QD.  Demonstrated insulin injection today at lunch with RN.  Answered questions regarding diet, hypoglycemia and blood sugar monitoring.  Wife seems very supportive of pt making lifestyle changes.  Will follow up with PCP in 1 week.

## 2011-10-19 NOTE — Plan of Care (Signed)
Problem: Phase II Progression Outcomes Goal: CBGs less than or equal to 200 Outcome: Adequate for Discharge Pt CBGs were in the 200s, newly diagnosed and received education Goal: Patient able to draw up & self administer Insulin Outcome: Adequate for Discharge Pt wife is going to help draw up the insulin at home Goal: Patient able to draw up & self administer Insulin Outcome: Adequate for Discharge Pt wife is going to help draw up insulin at home  Problem: Phase III Progression Outcomes Goal: CBGs less than or equal to 180 on planned home regimen Outcome: Adequate for Discharge Pt CBGs were in the 200s, newly diagnosed and received education  Problem: Discharge Progression Outcomes Goal: CBGs less than or equal to 180 on planned home regimen Outcome: Adequate for Discharge Pt CBGs were in the 200s, newly diagnosed and received education Goal: Patient states knowledge of home Diabetes Mellitus meds Outcome: Adequate for Discharge newly diagnosed and received education; sent home with diabetes booklet

## 2011-10-19 NOTE — Plan of Care (Signed)
Problem: Consults Goal: Diagnosis-Diabetes Mellitus Outcome: Completed/Met Date Met:  10/19/11 New Onset Type II

## 2011-10-19 NOTE — Discharge Summary (Signed)
DISCHARGE SUMMARY  Rosario Kushner  MR#: [id number]  DOB:18-Sep-1950  Date of Admission: 10/17/2011 Date of Discharge: 10/19/2011  Attending Physician:Raha Tennison JARRETT  Patient's BMW:[facility], MD, MD  Consults: -None  Discharge Diagnoses: .Hyperglycemia without ketosis .Diabetes mellitus .HTN (hypertension) .Dyspnea .Neuropathy Aortic Insufficiency   Initial presentation: Patient is a 61 year old white male with past medical history significant for multiple TIA's and hypertension. Per patient and wife for the past 2 days he has been dragging his left lower extremity. He thought he had his strokes he presented to the emergency room. He had MRI done that did not show acute event. He was over noted to have hyperglycemia. Patient complained of shortness of breath for the past few days. He also has a feeling in his chest just today of some indigestion. He has no real chest pain. 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 He complains of urinary frequency and increased thirst but no abdominal pain.   Hospital Course: .Hyperglycemia without ketosis Patient was admitted to the hospital with new onset diabetes. At the time of admission glucose was over 600. He was placed in the glucose stabilizer and his blood sugars are now down into the low 100s. He will be discharged home on Lantus pen 8 units at bedtime Glucotrol XL 10 mg daily when he follows up with his primary care physician he can be transitioned from the Lantus to metformin along with the Glucotrol. Patient to followup with his PCP in one week. Hemoglobin A1c 14.6.   Marland KitchenDiabetes mellitus As mentioned above patient with new onset diabetes. Diabetes educator talked with patient. He will followup at the outpatient diabetes clinic.   Marland KitchenHTN (hypertension) Patient was discharged home on quinapril for his blood pressure.   Marland KitchenDyspnea Patient on presentation had some shortness of  breath. His 2-D echo was normal and chest x-ray was normal. His d-dimer was negative. His symptom resolved once his blood sugar improved.   .Neuropathy This is a chronic problem. He is on gabapentin from his primary care physician. He'll continue with gabapentin.  Aortic Insufficiency Stable  Medication List  As of 10/19/2011  9:51 AM   TAKE these medications         aspirin 81 MG tablet   Take 81 mg by mouth daily.      atorvastatin 20 MG tablet   Commonly known as: LIPITOR   Take 20 mg by mouth daily.      freestyle lancets   Use as instructed      furosemide 40 MG tablet   Commonly known as: LASIX   Take 1 tablet (40 mg total) by mouth daily.      gabapentin 300 MG capsule   Commonly known as: NEURONTIN   Take 300 mg by mouth 4 (four) times daily.      glipiZIDE 10 MG 24 hr tablet   Commonly known as: GLUCOTROL XL   Take 1 tablet (10 mg total) by mouth daily with breakfast.      glucose blood test strip   Use as instructed      insulin glargine 100 UNIT/ML injection   Commonly known as: LANTUS   Inject 8 Units into the skin daily.      potassium chloride 10 MEQ CR tablet   Commonly known as: KLOR-CON   Take 1 tablet (10 mEq total) by mouth daily.      quinapril 20 MG tablet   Commonly known  as: ACCUPRIL   Take 20 mg by mouth daily.      terazosin 5 MG capsule   Commonly known as: HYTRIN   Take 5 mg by mouth daily.             Day of Discharge BP 101/73  Pulse 69  Temp(Src) 98.1 F (36.7 C) (Oral)  Resp 19  Ht 5\' 7"  (1.702 m)  Wt 94.938 kg (209 lb 4.8 oz)  BMI 32.78 kg/m2  SpO2 97%  Physical Exam: General: Patient appears older than his stated age. Does not seem to be in any acute distress.  Cardiovascular: Regular rate rhythm.  Lungs: Clear to auscultations bilaterally no wheezes rhonchi or rales.  Abdomen: Soft nontender nondistended obese positive bowel sounds and positive for umbilical hernia.  Extremities: Edema in the right lower  extremity that is chronic per patient.    Results for orders placed during the hospital encounter of 10/17/11 (from the past 24 hour(s))  GLUCOSE, CAPILLARY     Status: Abnormal   Collection Time   10/18/11 10:26 AM      Component Value Range   Glucose-Capillary 212 (*) 70 - 99 (mg/dL)   Comment 1 Notify RN    CARDIAC PANEL(CRET KIN+CKTOT+MB+TROPI)     Status: Normal   Collection Time   10/18/11 10:55 AM      Component Value Range   Total CK 73  7 - 232 (U/L)   CK, MB 2.8  0.3 - 4.0 (ng/mL)   Troponin I <0.30  <0.30 (ng/mL)   Relative Index RELATIVE INDEX IS INVALID  0.0 - 2.5   GLUCOSE, CAPILLARY     Status: Abnormal   Collection Time   10/18/11 11:33 AM      Component Value Range   Glucose-Capillary 253 (*) 70 - 99 (mg/dL)   Comment 1 Notify RN    GLUCOSE, CAPILLARY     Status: Abnormal   Collection Time   10/18/11 12:36 PM      Component Value Range   Glucose-Capillary 159 (*) 70 - 99 (mg/dL)   Comment 1 Notify RN    GLUCOSE, CAPILLARY     Status: Abnormal   Collection Time   10/18/11  1:46 PM      Component Value Range   Glucose-Capillary 241 (*) 70 - 99 (mg/dL)   Comment 1 Notify RN    GLUCOSE, CAPILLARY     Status: Abnormal   Collection Time   10/18/11  2:50 PM      Component Value Range   Glucose-Capillary 200 (*) 70 - 99 (mg/dL)   Comment 1 Notify RN    GLUCOSE, CAPILLARY     Status: Abnormal   Collection Time   10/18/11  3:27 PM      Component Value Range   Glucose-Capillary 154 (*) 70 - 99 (mg/dL)  GLUCOSE, CAPILLARY     Status: Normal   Collection Time   10/18/11  4:43 PM      Component Value Range   Glucose-Capillary 93  70 - 99 (mg/dL)  GLUCOSE, CAPILLARY     Status: Abnormal   Collection Time   10/18/11  5:14 PM      Component Value Range   Glucose-Capillary 67 (*) 70 - 99 (mg/dL)  GLUCOSE, CAPILLARY     Status: Abnormal   Collection Time   10/18/11  5:37 PM      Component Value Range   Glucose-Capillary 100 (*) 70 - 99 (mg/dL)  GLUCOSE, CAPILLARY  Status: Abnormal   Collection Time   10/18/11  6:40 PM      Component Value Range   Glucose-Capillary 183 (*) 70 - 99 (mg/dL)  GLUCOSE, CAPILLARY     Status: Abnormal   Collection Time   10/18/11  7:59 PM      Component Value Range   Glucose-Capillary 224 (*) 70 - 99 (mg/dL)   Comment 1 Notify RN    GLUCOSE, CAPILLARY     Status: Abnormal   Collection Time   10/18/11  9:09 PM      Component Value Range   Glucose-Capillary 243 (*) 70 - 99 (mg/dL)  GLUCOSE, CAPILLARY     Status: Abnormal   Collection Time   10/18/11 10:17 PM      Component Value Range   Glucose-Capillary 240 (*) 70 - 99 (mg/dL)   Comment 1 Notify RN    GLUCOSE, CAPILLARY     Status: Abnormal   Collection Time   10/18/11 11:27 PM      Component Value Range   Glucose-Capillary 253 (*) 70 - 99 (mg/dL)  GLUCOSE, CAPILLARY     Status: Abnormal   Collection Time   10/19/11 12:31 AM      Component Value Range   Glucose-Capillary 185 (*) 70 - 99 (mg/dL)  GLUCOSE, CAPILLARY     Status: Abnormal   Collection Time   10/19/11  1:37 AM      Component Value Range   Glucose-Capillary 149 (*) 70 - 99 (mg/dL)  GLUCOSE, CAPILLARY     Status: Abnormal   Collection Time   10/19/11  2:42 AM      Component Value Range   Glucose-Capillary 126 (*) 70 - 99 (mg/dL)   Comment 1 Notify RN     Comment 2 Documented in Chart    GLUCOSE, CAPILLARY     Status: Abnormal   Collection Time   10/19/11  3:46 AM      Component Value Range   Glucose-Capillary 100 (*) 70 - 99 (mg/dL)   Comment 1 Notify RN    CBC     Status: Abnormal   Collection Time   10/19/11  4:24 AM      Component Value Range   WBC 10.8 (*) 4.0 - 10.5 (K/uL)   RBC 4.09 (*) 4.22 - 5.81 (MIL/uL)   Hemoglobin 12.1 (*) 13.0 - 17.0 (g/dL)   HCT 96.0 (*) 45.4 - 52.0 (%)   MCV 90.2  78.0 - 100.0 (fL)   MCH 29.6  26.0 - 34.0 (pg)   MCHC 32.8  30.0 - 36.0 (g/dL)   RDW 09.8  11.9 - 14.7 (%)   Platelets 291  150 - 400 (K/uL)  BASIC METABOLIC PANEL     Status: Abnormal    Collection Time   10/19/11  4:24 AM      Component Value Range   Sodium 137  135 - 145 (mEq/L)   Potassium 3.8  3.5 - 5.1 (mEq/L)   Chloride 103  96 - 112 (mEq/L)   CO2 28  19 - 32 (mEq/L)   Glucose, Bld 100 (*) 70 - 99 (mg/dL)   BUN 14  6 - 23 (mg/dL)   Creatinine, Ser 8.29  0.50 - 1.35 (mg/dL)   Calcium 8.8  8.4 - 56.2 (mg/dL)   GFR calc non Af Amer >90  >90 (mL/min)   GFR calc Af Amer >90  >90 (mL/min)  GLUCOSE, CAPILLARY     Status: Abnormal   Collection Time  10/19/11  4:46 AM      Component Value Range   Glucose-Capillary 107 (*) 70 - 99 (mg/dL)  GLUCOSE, CAPILLARY     Status: Abnormal   Collection Time   10/19/11  5:50 AM      Component Value Range   Glucose-Capillary 143 (*) 70 - 99 (mg/dL)   Comment 1 Notify RN    GLUCOSE, CAPILLARY     Status: Abnormal   Collection Time   10/19/11  7:33 AM      Component Value Range   Glucose-Capillary 201 (*) 70 - 99 (mg/dL)    Disposition: Home   Follow-up Appts: Discharge Orders    Future Appointments: Provider: Department: Dept Phone: Center:   11/06/2011 3:00 PM Vevelyn Royals, RD [facility] [phone] NDM     Future Orders Please Complete By Expires   Referral to Nutrition and Diabetes Services      Questions: Responses:   Check all special needs that apply to patient requiring 1 on 1 DSMT Financial barriers   Choose type of Diabetes Self-Management Training (DSMT) training services and number of hours requested Follow-up DSMT: enter hours in comments   DSMT Content Self-blood glucose monitoring   Choose the type of Medical Nutrition Therapy (MNT) and number of hours Does not apply   If add'l MNT in same year, specify change in condition, treatment, diagnosis    FOR MEDICARE PATIENTS: I hereby certify that I am managing this beneficiary's diabetes condition and that the above prescribed training is a necessary part of management. Does not apply      Follow-up Information    Follow up with  BLOUNT,JR.,JAMES G in 1 week.         Tests Needing Follow-up: glucose  Time spent in discharge (includes decision making & examination of pt): 45 minutes  Signed: Makinzey Banes JARRETT  10/19/2011, 9:51 AM

## 2011-10-22 ENCOUNTER — Emergency Department (HOSPITAL_COMMUNITY)
Admission: EM | Admit: 2011-10-22 | Discharge: 2011-10-22 | Discharge status: Against Medical Advice | Payer: Self-pay | Attending: Emergency Medicine | Admitting: Emergency Medicine

## 2011-10-22 DIAGNOSIS — R7309 Other abnormal glucose: Secondary | ICD-10-CM | POA: Insufficient documentation

## 2011-10-22 NOTE — ED Notes (Signed)
Pt and spouse states they want to call the pcp to see if they should stay. Pt are not sure how much there insurance covers. Pt states he is not symptomatic and he took his blood sugar after he has eatten. Pt states he does not want to stay. Pt states he is going to his pcp. Pt states if i get seen by ed md they are going to tell me to go see my pcp. Pt states if he feels worst he will return."

## 2011-11-06 ENCOUNTER — Ambulatory Visit: Payer: Self-pay | Admitting: *Deleted

## 2011-11-27 ENCOUNTER — Telehealth: Payer: Self-pay | Admitting: Cardiovascular Disease

## 2011-11-27 DIAGNOSIS — R0609 Other forms of dyspnea: Secondary | ICD-10-CM

## 2011-11-27 MED ORDER — FUROSEMIDE 40 MG PO TABS: 40.0000 mg | ORAL_TABLET | Freq: Every day | ORAL | Status: DC

## 2011-11-27 NOTE — Telephone Encounter (Signed)
Pt needs refill of furosimide 40 mg [phone]

## 2011-12-07 ENCOUNTER — Emergency Department (HOSPITAL_COMMUNITY)
Admission: EM | Admit: 2011-12-07 | Discharge: 2011-12-07 | Disposition: A | Discharge status: Home / Self Care | Payer: Self-pay | Attending: Emergency Medicine | Admitting: Emergency Medicine

## 2011-12-07 ENCOUNTER — Emergency Department (HOSPITAL_COMMUNITY): Payer: Self-pay

## 2011-12-07 ENCOUNTER — Encounter (HOSPITAL_COMMUNITY): Payer: Self-pay | Admitting: *Deleted

## 2011-12-07 DIAGNOSIS — E785 Hyperlipidemia, unspecified: Secondary | ICD-10-CM | POA: Insufficient documentation

## 2011-12-07 DIAGNOSIS — J40 Bronchitis, not specified as acute or chronic: Secondary | ICD-10-CM | POA: Insufficient documentation

## 2011-12-07 DIAGNOSIS — J438 Other emphysema: Secondary | ICD-10-CM | POA: Insufficient documentation

## 2011-12-07 DIAGNOSIS — E119 Type 2 diabetes mellitus without complications: Secondary | ICD-10-CM | POA: Insufficient documentation

## 2011-12-07 DIAGNOSIS — Z79899 Other long term (current) drug therapy: Secondary | ICD-10-CM | POA: Insufficient documentation

## 2011-12-07 DIAGNOSIS — Z8673 Personal history of transient ischemic attack (TIA), and cerebral infarction without residual deficits: Secondary | ICD-10-CM | POA: Insufficient documentation

## 2011-12-07 DIAGNOSIS — I10 Essential (primary) hypertension: Secondary | ICD-10-CM | POA: Insufficient documentation

## 2011-12-07 DIAGNOSIS — Z7982 Long term (current) use of aspirin: Secondary | ICD-10-CM | POA: Insufficient documentation

## 2011-12-07 LAB — BASIC METABOLIC PANEL
GFR calc Af Amer: >90 mL/min (ref >90)
GFR calc non Af Amer: >90 mL/min (ref >90)
Glucose, Bld: 131 mg/dL — ABNORMAL HIGH (ref 70–99)
Potassium: 3.8 mEq/L (ref 3.5–5.1)
Sodium: 138 mEq/L (ref 135–145)

## 2011-12-07 LAB — URINALYSIS, ROUTINE W REFLEX MICROSCOPIC
Nitrite: NEGATIVE
Protein, ur: NEGATIVE mg/dL
Specific Gravity, Urine: 1.012 (ref 1.005–1.030)
Urobilinogen, UA: 1 mg/dL (ref 0.0–1.0)

## 2011-12-07 LAB — CBC
MCH: 29.3 pg (ref 26.0–34.0)
Platelets: 257 10*3/uL (ref 150–400)
RBC: 4.09 MIL/uL — ABNORMAL LOW (ref 4.22–5.81)
RDW: 13.5 % (ref 11.5–15.5)

## 2011-12-07 LAB — DIFFERENTIAL
Basophils Absolute: 0.1 10*3/uL (ref 0.0–0.1)
Basophils Relative: 1 % (ref 0–1)
Eosinophils Absolute: 0.1 10*3/uL (ref 0.0–0.7)
Lymphs Abs: 1.2 10*3/uL (ref 0.7–4.0)
Neutrophils Relative %: 76 % (ref 43–77)

## 2011-12-07 MED ORDER — PREDNISONE 20 MG PO TABS: 20.0000 mg | ORAL_TABLET | Freq: Two times a day (BID) | ORAL | Status: AC

## 2011-12-07 MED ORDER — IPRATROPIUM BROMIDE 0.02 % IN SOLN
0.5000 mg | Freq: Once | RESPIRATORY_TRACT | Status: AC
  Administered 2011-12-07: 0.5 mg via RESPIRATORY_TRACT
  Filled 2011-12-07: qty 2.5

## 2011-12-07 MED ORDER — PREDNISONE 20 MG PO TABS
60.0000 mg | ORAL_TABLET | Freq: Once | ORAL | Status: AC
  Administered 2011-12-07: 60 mg via ORAL
  Filled 2011-12-07: qty 3

## 2011-12-07 MED ORDER — ONDANSETRON HCL 4 MG/2ML IJ SOLN: 4.0000 mg | Freq: Once | INTRAMUSCULAR | Status: DC

## 2011-12-07 MED ORDER — ALBUTEROL SULFATE HFA 108 (90 BASE) MCG/ACT IN AERS
2.0000 | INHALATION_SPRAY | RESPIRATORY_TRACT | Status: DC | PRN
  Filled 2011-12-07: qty 6.7

## 2011-12-07 MED ORDER — AEROCHAMBER Z-STAT PLUS/MEDIUM MISC
1.0000 | Freq: Once | Status: AC
  Administered 2011-12-07: 1
  Filled 2011-12-07: qty 1

## 2011-12-07 MED ORDER — ALBUTEROL SULFATE (5 MG/ML) 0.5% IN NEBU
10.0000 mg | INHALATION_SOLUTION | Freq: Once | RESPIRATORY_TRACT | Status: AC
  Administered 2011-12-07: 10 mg via RESPIRATORY_TRACT
  Filled 2011-12-07: qty 1

## 2011-12-07 NOTE — Discharge Instructions (Signed)
  Use the inhaler, 2 puffs every 3-4 hours as needed for trouble breathing or cough. Start the prednisone prescription tomorrow.  Bronchitis Bronchitis is the body's way of reacting to injury and/or infection (inflammation) of the bronchi. Bronchi are the air tubes that extend from the windpipe into the lungs. If the inflammation becomes severe, it may cause shortness of breath. CAUSES  Inflammation may be caused by:  A virus.   Germs (bacteria).   Dust.   Allergens.   Pollutants and many other irritants.  The cells lining the bronchial tree are covered with tiny hairs (cilia). These constantly beat upward, away from the lungs, toward the mouth. This keeps the lungs free of pollutants. When these cells become too irritated and are unable to do their job, mucus begins to develop. This causes the characteristic cough of bronchitis. The cough clears the lungs when the cilia are unable to do their job. Without either of these protective mechanisms, the mucus would settle in the lungs. Then you would develop pneumonia. Smoking is a common cause of bronchitis and can contribute to pneumonia. Stopping this habit is the single most important thing you can do to help yourself. TREATMENT   Your caregiver may prescribe an antibiotic if the cough is caused by bacteria. Also, medicines that open up your airways make it easier to breathe. Your caregiver may also recommend or prescribe an expectorant. It will loosen the mucus to be coughed up. Only take over-the-counter or prescription medicines for pain, discomfort, or fever as directed by your caregiver.   Removing whatever causes the problem (smoking, for example) is critical to preventing the problem from getting worse.   Cough suppressants may be prescribed for relief of cough symptoms.   Inhaled medicines may be prescribed to help with symptoms now and to help prevent problems from returning.   For those with recurrent (chronic) bronchitis, there  may be a need for steroid medicines.  SEEK IMMEDIATE MEDICAL CARE IF:   During treatment, you develop more pus-like mucus (purulent sputum).   You have a fever.   Your baby is older than 3 months with a rectal temperature of 102 F (38.9 C) or higher.   Your baby is 72 months old or younger with a rectal temperature of 100.4 F (38 C) or higher.   You become progressively more ill.   You have increased difficulty breathing, wheezing, or shortness of breath.  It is necessary to seek immediate medical care if you are elderly or sick from any other disease. MAKE SURE YOU:   Understand these instructions.   Will watch your condition.   Will get help right away if you are not doing well or get worse.  Document Released: 06/18/2005 Document Revised: 06/07/2011 Document Reviewed: 04/27/2008 [facility] Patient Information 2012 [place], [place].

## 2011-12-07 NOTE — ED Notes (Addendum)
Note placed in error

## 2011-12-07 NOTE — ED Provider Notes (Signed)
History     CSN: [id number]  Arrival date & time 12/07/11  4098   First MD Initiated Contact with Patient 12/07/11 1026      Chief Complaint  Patient presents with  . Shortness of Breath    (Consider location/radiation/quality/duration/timing/severity/associated sxs/prior treatment) HPI Comments: [name] is a 61 y.o. male who had insidious onset of shortness of breath. This morning. That has improved since starting on oxygen. In emergency department. [place], was called to his home, but they did not transport him. He denies chest pain, weakness, or dizziness. He was recently treated for bronchitis, with a antibiotic, but not an inhaler. He saw his PCP for a checkup 4 days ago. He denies anorexia, change in bowel or urinary habits. He feels more short of breath with walking, it gets better with rest.  The history is provided by the patient.    Past Medical History  Diagnosis Date  . Emphysema   . Leaky heart valve   . Fatty liver   . TIA (transient ischemic attack)   . Hypertension   . Hyperlipidemia   . Neuropathy   . Diabetes mellitus 10/17/2011    newly dx today  . Slow urinary stream   . Rheumatic fever   . 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 Edema leg     right leg has leaky valve and right foot swells  . Excessive ear wax   . Hernia     near navel    Past Surgical History  Procedure Date  . Lithotripsy     Family History  Problem Relation Age of Onset  . Emphysema Mother   . Coronary artery disease Brother   . Coronary artery disease Brother   . Coronary artery disease Sister     History  Substance Use Topics  . Smoking status: Never Smoker   . Smokeless tobacco: Never Used  . Alcohol Use: No      Review of Systems  All other systems reviewed and are negative.    Date: 12/07/2011  Rate: 71  Rhythm: normal sinus rhythm  QRS Axis: left  Intervals: normal  ST/T Wave abnormalities: normal  Conduction Disutrbances:nonspecific intraventricular conduction delay  Narrative Interpretation: LVH  Old EKG Reviewed: unchanged  Allergies  Percocet  Home Medications   Current Outpatient Rx  Name Route Sig Dispense Refill  . ASPIRIN 81 MG PO TABS Oral Take 81 mg by mouth daily.      . ATORVASTATIN CALCIUM 20 MG PO TABS Oral Take 20 mg by mouth daily.      . FUROSEMIDE 40 MG PO TABS Oral Take 1 tablet (40 mg total) by mouth daily. 30 tablet 6  . GABAPENTIN 300 MG PO CAPS Oral Take 300 mg by mouth 4 (four) times daily.      Marland Kitchen METFORMIN HCL 500 MG PO TABS Oral Take 500 mg by mouth 2 (two) times daily with a meal.    . POTASSIUM CHLORIDE 10 MEQ PO TBCR Oral Take 1 tablet (10 mEq total) by mouth daily. 30 tablet 6  . QUINAPRIL HCL 20 MG PO TABS Oral Take 20 mg by mouth daily.    Marland Kitchen TERAZOSIN HCL 5 MG PO CAPS Oral Take 5 mg by mouth daily.    Marland Kitchen GLUCOSE BLOOD VI STRP  Use as instructed 100 each 12  . FREESTYLE LANCETS MISC  Use as instructed 100 each 12  . PREDNISONE 20 MG PO  TABS Oral Take 1 tablet (20 mg total) by mouth 2 (two) times daily. 10 tablet 0    BP 104/66  Pulse 80  Temp(Src) 98.6 F (37 C) (Oral)  Resp 16  SpO2 94%  Physical Exam  Nursing note and vitals reviewed. Constitutional: He is oriented to person, place, and time. He appears well-developed and well-nourished.  HENT:  Head: Normocephalic and atraumatic.  Right Ear: External ear normal.  Left Ear: External ear normal.  Eyes: Conjunctivae and EOM are normal. Pupils are equal, round, and reactive to light.  Neck: Normal range of motion and phonation normal. Neck supple.  Cardiovascular: Normal rate, regular rhythm, normal heart sounds and intact distal pulses.   Pulmonary/Chest: Effort normal. He exhibits no bony tenderness.       Mild decreased air movement with scattered rhonchi. No wheezes or rales.  Abdominal: Soft. Normal appearance. There is no tenderness.  Musculoskeletal: Normal range of motion. He  exhibits edema (2+ edema bilaterally). He exhibits no tenderness.  Neurological: He is alert and oriented to person, place, and time. He has normal strength. No cranial nerve deficit or sensory deficit. He exhibits normal muscle tone. Coordination normal.  Skin: Skin is warm, dry and intact.  Psychiatric: He has a normal mood and affect. His behavior is normal. Judgment and thought content normal.    ED Course  Procedures (including critical care time)  ED Treatment: Neb. With Albuterol and Atrovent; pt felt better.  Prednisone and Albuterol inhaler given.     Labs Reviewed  CBC - Abnormal; Notable for the following:    RBC 4.09 (*)    Hemoglobin 12.0 (*)    HCT 36.7 (*)    All other components within normal limits  BASIC METABOLIC PANEL - Abnormal; Notable for the following:    Glucose, Bld 131 (*)    All other components within normal limits  DIFFERENTIAL  URINALYSIS, ROUTINE W REFLEX MICROSCOPIC  D-DIMER, QUANTITATIVE  URINE CULTURE   Dg Chest 2 View  12/07/2011  *RADIOLOGY REPORT*  Clinical Data: Shortness of breath, cough for 3 weeks  CHEST - 2 VIEW  Comparison: Chest x-ray of 10/17/2011 and 12/10/2008  Findings: Interstitial markings again are noted consistent with chronic interstitial lung disease.  Linear scarring in the left lower lobe is stable.  No infiltrate or effusion is seen.  The heart is mildly enlarged and stable.  The bones are osteopenic.  IMPRESSION: Stable chronic interstitial change.  No active lung disease. Stable mild cardiomegaly.  Original Report Authenticated By: Juline Patch, M.D.     1. Bronchitis       MDM  Eval c/w bronchospasm d/t bronchitis; doubt PE, PNE, CHF. Doubt metabolic instability, serious bacterial infection or impending vascular collapse; the patient is stable for discharge.  Plan: Home Medications- Prednisone and inhaler; Home Treatments- rest; Recommended follow up- PCP prn        Flint Melter, MD 12/07/11 2132

## 2011-12-07 NOTE — ED Notes (Signed)
RT called for treatment

## 2011-12-07 NOTE — ED Notes (Signed)
Pt reports sob since 0830 this am, noticed it when he was shaving. Sts has continued but not worsened. Pt ambulatory in triage, noted to be speaking in short sentences. 97% RA.

## 2011-12-07 NOTE — ED Notes (Signed)
Wentz, MD at bedside.  

## 2011-12-08 LAB — URINE CULTURE

## 2012-01-16 ENCOUNTER — Encounter (HOSPITAL_COMMUNITY): Payer: Self-pay | Admitting: Emergency Medicine

## 2012-01-16 ENCOUNTER — Emergency Department (HOSPITAL_COMMUNITY)
Admission: EM | Admit: 2012-01-16 | Discharge: 2012-01-17 | Disposition: A | Discharge status: Home / Self Care | Payer: Self-pay | Attending: Emergency Medicine | Admitting: Emergency Medicine

## 2012-01-16 DIAGNOSIS — E785 Hyperlipidemia, unspecified: Secondary | ICD-10-CM | POA: Insufficient documentation

## 2012-01-16 DIAGNOSIS — Z7982 Long term (current) use of aspirin: Secondary | ICD-10-CM | POA: Insufficient documentation

## 2012-01-16 DIAGNOSIS — Z8673 Personal history of transient ischemic attack (TIA), and cerebral infarction without residual deficits: Secondary | ICD-10-CM | POA: Insufficient documentation

## 2012-01-16 DIAGNOSIS — I10 Essential (primary) hypertension: Secondary | ICD-10-CM | POA: Insufficient documentation

## 2012-01-16 DIAGNOSIS — H1010 Acute atopic conjunctivitis, unspecified eye: Secondary | ICD-10-CM

## 2012-01-16 DIAGNOSIS — Z79899 Other long term (current) drug therapy: Secondary | ICD-10-CM | POA: Insufficient documentation

## 2012-01-16 DIAGNOSIS — E119 Type 2 diabetes mellitus without complications: Secondary | ICD-10-CM | POA: Insufficient documentation

## 2012-01-16 DIAGNOSIS — J438 Other emphysema: Secondary | ICD-10-CM | POA: Insufficient documentation

## 2012-01-16 DIAGNOSIS — H1045 Other chronic allergic conjunctivitis: Secondary | ICD-10-CM | POA: Insufficient documentation

## 2012-01-16 DIAGNOSIS — H538 Other visual disturbances: Secondary | ICD-10-CM | POA: Insufficient documentation

## 2012-01-16 DIAGNOSIS — I2510 Atherosclerotic heart disease of native coronary artery without angina pectoris: Secondary | ICD-10-CM | POA: Insufficient documentation

## 2012-01-16 LAB — GLUCOSE, CAPILLARY: Glucose-Capillary: 119 mg/dL — ABNORMAL HIGH (ref 70–99)

## 2012-01-16 NOTE — ED Notes (Signed)
Pt c/o blurred vision in R eye onset 2145. Pain in eye for last few weeks, then clears, no visual disturbances until tonight

## 2012-01-17 MED ORDER — OLOPATADINE HCL 0.1 % OP SOLN
1.0000 [drp] | Freq: Two times a day (BID) | OPHTHALMIC | Status: DC
  Administered 2012-01-17: 1 [drp] via OPHTHALMIC
  Filled 2012-01-17: qty 5

## 2012-01-17 NOTE — ED Provider Notes (Signed)
History     CSN: [id number]  Arrival date & time 01/16/12  2305   First MD Initiated Contact with Patient 01/16/12 2352      Chief Complaint  Patient presents with  . Blurred Vision    (Consider location/radiation/quality/duration/timing/severity/associated sxs/prior treatment) HPI 61 year old male presents to emergency department with complaint of blurred vision in his right eye. Patient reports he fell asleep watching TV and when he woke up around 10 PM he noticed that his vision in his right eye was blurred, fuzzy. Patient reports he has a vertical lines in his visual field as well. The lines move when he moves his eyes from side to side. Patient reports that for some time he has had itching and irritation of his eyes right worse than left with matting of the eyes in the morning. Symptoms are worse when he works outside or cuts the grass, or when he eats peanuts. Since leaving home and coming to the emergency department, he reports his blurred vision has improved somewhat. Patient is a diabetic, recently diagnosed in April. He denies routine eye exams. No prior history of retinal detachment. He cannot recall when his last eye exam was. 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 Wife reports that she was concerned  as he had a short episode of slurred speech with some stuttering upon arrival to the emergency department, lasting just a few sentences, but reports this has since resolved. She also thought that the skin around his right eye was puffier than usual, reports this has improved since being in the emergency department. No history of stroke, he has had recent admission and workup for same and had negative MRI. Patient and wife are concerned that his symptoms are due to having done heavy lifting at work yesterday, which he is on restricted duty and is not supposed to be doing. Past Medical History  Diagnosis Date  . Emphysema   . Leaky heart valve   . Fatty  liver   . TIA (transient ischemic attack)   . Hypertension   . Hyperlipidemia   . Neuropathy   . Diabetes mellitus 10/17/2011    newly dx today  . Slow urinary stream   . Rheumatic fever   . 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 Edema leg     right leg has leaky valve and right foot swells  . Excessive ear wax   . Hernia     near navel    Past Surgical History  Procedure Date  . Lithotripsy   . Circumcision     Family History  Problem Relation Age of Onset  . Emphysema Mother   . Coronary artery disease Brother   . Coronary artery disease Brother   . Coronary artery disease Sister     History  Substance Use Topics  . Smoking status: Never Smoker   . Smokeless tobacco: Never Used  . Alcohol Use: No      Review of Systems  Constitutional: Negative.   HENT: Positive for facial swelling.   Eyes: Positive for pain, discharge, redness, itching and visual disturbance.  Respiratory: Negative.   Cardiovascular: Negative.   Gastrointestinal: Negative.   Genitourinary: Negative.   Musculoskeletal: Positive for myalgias, arthralgias and gait problem.  Skin: Negative.   Neurological: Negative.   Hematological: Negative.   Psychiatric/Behavioral: Negative.     Allergies  Percocet  Home Medications   Current Outpatient  Rx  Name Route Sig Dispense Refill  . ASPIRIN 81 MG PO TABS Oral Take 81 mg by mouth daily.      . ATORVASTATIN CALCIUM 20 MG PO TABS Oral Take 20 mg by mouth daily.      . FUROSEMIDE 40 MG PO TABS Oral Take 1 tablet (40 mg total) by mouth daily. 30 tablet 6  . GABAPENTIN 300 MG PO CAPS Oral Take 300 mg by mouth 4 (four) times daily.      Marland Kitchen GLUCOSE BLOOD VI STRP  Use as instructed 100 each 12  . FREESTYLE LANCETS MISC  Use as instructed 100 each 12  . METFORMIN HCL 500 MG PO TABS Oral Take 500 mg by mouth 2 (two) times daily with a meal.    . POTASSIUM CHLORIDE 10 MEQ PO TBCR Oral Take 1 tablet (10 mEq total)  by mouth daily. 30 tablet 6  . QUINAPRIL HCL 20 MG PO TABS Oral Take 20 mg by mouth daily.    Marland Kitchen TERAZOSIN HCL 5 MG PO CAPS Oral Take 5 mg by mouth daily.      BP 136/70  Pulse 85  Temp 98.6 F (37 C) (Oral)  Resp 17  Ht 5\' 7"  (1.702 m)  Wt 190 lb (86.183 kg)  BMI 29.76 kg/m2  SpO2 98%  Physical Exam  Nursing note and vitals reviewed. Constitutional: He is oriented to person, place, and time.       Elderly male appears older than stated age chronically ill appearing  HENT:  Head: Normocephalic and atraumatic.  Right Ear: External ear normal.  Left Ear: External ear normal.  Nose: Nose normal.  Mouth/Throat: Oropharynx is clear and moist. No oropharyngeal exudate.  Eyes: EOM are normal. Pupils are equal, round, and reactive to light. No foreign bodies found. Right eye exhibits discharge. Right eye exhibits no chemosis and no exudate. No foreign body present in the right eye. Left eye exhibits discharge. Left eye exhibits no chemosis and no exudate. No foreign body present in the left eye. Right conjunctiva is injected. Right conjunctiva has no hemorrhage. Left conjunctiva is injected. Left conjunctiva has no hemorrhage. No scleral icterus.  Slit lamp exam:      The right eye shows fluorescein uptake (diffuse uptake without specific corneal abrasion). The right eye shows no corneal abrasion, no corneal flare, no corneal ulcer, no foreign body, no hyphema, no hypopyon and no anterior chamber bulge.       The left eye shows no corneal abrasion, no corneal flare, no corneal ulcer, no foreign body, no hyphema, no hypopyon, no fluorescein uptake and no anterior chamber bulge.  Cardiovascular: Normal rate, regular rhythm and intact distal pulses.  Exam reveals no gallop and no friction rub.   Murmur heard. Pulmonary/Chest: Effort normal and breath sounds normal. No respiratory distress. He has no wheezes. He has no rales. He exhibits no tenderness.  Abdominal: Soft. Bowel sounds are normal. He  exhibits no distension and no mass. There is no tenderness. There is no rebound and no guarding.  Musculoskeletal: He exhibits tenderness (tenderness with palpation to right medial knee with crepitus noted, but no effusion erythema joint line tenderness or ligament laxity). He exhibits no edema.  Neurological: He is alert and oriented to person, place, and time. He has normal reflexes. No cranial nerve deficit. He exhibits normal muscle tone. Coordination normal.  Skin: Skin is warm and dry. No rash noted. No erythema. No pallor.  Psychiatric: He has a normal mood and  affect. His behavior is normal. Judgment and thought content normal.    ED Course  Procedures (including critical care time)  Labs Reviewed  GLUCOSE, CAPILLARY - Abnormal; Notable for the following:    Glucose-Capillary 119 (*)     All other components within normal limits  LAB REPORT - SCANNED   No results found.   1. Blurred vision   2. Allergic conjunctivitis       MDM  61 year old male with blurred vision. Neuro exam is normal, bedside ultrasound shows no signs of retinal detachment. 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 Will start patient on Patanol, and refer to ophthalmology. Patient has history of TIAs, and some of his symptoms may be associated with TIA, but he has had workup in the past and is on appropriate therapy.        Olivia Mackie, MD 01/17/12 [phone]

## 2012-04-05 ENCOUNTER — Emergency Department (HOSPITAL_COMMUNITY): Payer: Self-pay

## 2012-04-05 ENCOUNTER — Encounter (HOSPITAL_COMMUNITY): Payer: Self-pay | Admitting: Emergency Medicine

## 2012-04-05 ENCOUNTER — Emergency Department (HOSPITAL_COMMUNITY)
Admission: EM | Admit: 2012-04-05 | Discharge: 2012-04-05 | Disposition: A | Discharge status: Home / Self Care | Payer: Self-pay | Attending: Emergency Medicine | Admitting: Emergency Medicine

## 2012-04-05 DIAGNOSIS — I10 Essential (primary) hypertension: Secondary | ICD-10-CM | POA: Insufficient documentation

## 2012-04-05 DIAGNOSIS — E119 Type 2 diabetes mellitus without complications: Secondary | ICD-10-CM | POA: Insufficient documentation

## 2012-04-05 DIAGNOSIS — R609 Edema, unspecified: Secondary | ICD-10-CM | POA: Insufficient documentation

## 2012-04-05 DIAGNOSIS — R0602 Shortness of breath: Secondary | ICD-10-CM | POA: Insufficient documentation

## 2012-04-05 DIAGNOSIS — J189 Pneumonia, unspecified organism: Secondary | ICD-10-CM | POA: Insufficient documentation

## 2012-04-05 DIAGNOSIS — R079 Chest pain, unspecified: Secondary | ICD-10-CM | POA: Insufficient documentation

## 2012-04-05 DIAGNOSIS — R209 Unspecified disturbances of skin sensation: Secondary | ICD-10-CM | POA: Insufficient documentation

## 2012-04-05 DIAGNOSIS — J438 Other emphysema: Secondary | ICD-10-CM | POA: Insufficient documentation

## 2012-04-05 DIAGNOSIS — Z8673 Personal history of transient ischemic attack (TIA), and cerebral infarction without residual deficits: Secondary | ICD-10-CM | POA: Insufficient documentation

## 2012-04-05 DIAGNOSIS — H02409 Unspecified ptosis of unspecified eyelid: Secondary | ICD-10-CM | POA: Insufficient documentation

## 2012-04-05 LAB — GLUCOSE, CAPILLARY: Glucose-Capillary: 112 mg/dL — ABNORMAL HIGH (ref 70–99)

## 2012-04-05 LAB — COMPREHENSIVE METABOLIC PANEL
BUN: 18 mg/dL (ref 6–23)
CO2: 27 mEq/L (ref 19–32)
Calcium: 9.1 mg/dL (ref 8.4–10.5)
Creatinine, Ser: 0.81 mg/dL (ref 0.50–1.35)
GFR calc Af Amer: >90 mL/min (ref >90)
GFR calc non Af Amer: >90 mL/min (ref >90)
Glucose, Bld: 97 mg/dL (ref 70–99)
Total Protein: 7 g/dL (ref 6.0–8.3)

## 2012-04-05 LAB — CBC WITH DIFFERENTIAL/PLATELET
Basophils Absolute: 0.1 10*3/uL (ref 0.0–0.1)
HCT: 35 % — ABNORMAL LOW (ref 39.0–52.0)
Hemoglobin: 11.6 g/dL — ABNORMAL LOW (ref 13.0–17.0)
Lymphocytes Relative: 22 % (ref 12–46)
Monocytes Absolute: 0.4 10*3/uL (ref 0.1–1.0)
Monocytes Relative: 6 % (ref 3–12)
Neutro Abs: 4.4 10*3/uL (ref 1.7–7.7)
Neutrophils Relative %: 67 % (ref 43–77)
RDW: 13.5 % (ref 11.5–15.5)
WBC: 6.5 10*3/uL (ref 4.0–10.5)

## 2012-04-05 LAB — POCT I-STAT TROPONIN I: Troponin i, poc: 0 ng/mL (ref 0.00–0.08)

## 2012-04-05 MED ORDER — AZITHROMYCIN 250 MG PO TABS
500.0000 mg | ORAL_TABLET | Freq: Once | ORAL | Status: AC
  Administered 2012-04-05: 500 mg via ORAL
  Filled 2012-04-05: qty 1

## 2012-04-05 MED ORDER — DEXTROSE 5 % IV SOLN
1.0000 g | Freq: Once | INTRAVENOUS | Status: AC
  Administered 2012-04-05: 1 g via INTRAVENOUS
  Filled 2012-04-05: qty 10

## 2012-04-05 MED ORDER — AZITHROMYCIN 250 MG PO TABS: 250.0000 mg | ORAL_TABLET | Freq: Every day | ORAL | Status: DC

## 2012-04-05 NOTE — ED Notes (Signed)
Patient requested information on what types medication to take for cough and cold, also requesting info on when it will be ok to take flu shot. Spoke with Dondra Spry, NP- She states wait one week for flu immunization, and for patient to take Chloracedin HBP OTC for cough and cold. Information relayed to family.

## 2012-04-05 NOTE — ED Notes (Signed)
Pt w/ onset of shortness of breath this a.m. 0600 and EMTs were out and wanted to transport. Wife would not allow transport this a.m. Pt started having chest pain around 1600 this afternoon which improved after eating some food. Pt reports blurry vision left eye for 30 minutes 2 days ago. Pt also reports numbness in left leg while at work yesterday and had to "drag" leg around for 2 hours before numbness resolved.

## 2012-04-05 NOTE — ED Notes (Signed)
Discharge instructions reviewed with patient and family. Rx reviewed. All questions answered.

## 2012-04-05 NOTE — ED Provider Notes (Signed)
History     CSN: [id number]  Arrival date & time 04/05/12  1757   First MD Initiated Contact with Patient 04/05/12 1949      Chief Complaint  Patient presents with  . Shortness of Breath    (Consider location/radiation/quality/duration/timing/severity/associated sxs/prior treatment) HPI Comments: Patient long, complicated medical history reports 3 days of intermittent symptoms on day 1 he noticed, that his left eye.  Lid felt heavy.  This lasted for approximately 30 minutes.  On day 2 he had some discomfort on one half of his tongue and felt as though he had to drag his left leg for approximately 2 hours after one hours worth of rest is totally resolved, and had pin prick sensation in finger tips of R hand for several seconds.   Today.  He woke with shortness of breath with exertion, which resolves with rest, but tonight, developed chest pain, as well without nausea or diaphoresis   Patient is a 61 y.o. male presenting with shortness of breath. The history is provided by the patient.  Shortness of Breath  The current episode started 3 to 5 days ago. The problem occurs frequently. The problem has been gradually improving. The problem is moderate. The symptoms are relieved by rest. Nothing aggravates the symptoms. Associated symptoms include chest pain and shortness of breath. Pertinent negatives include no fever, no rhinorrhea, no sore throat, no cough and no wheezing.    Past Medical History  Diagnosis Date  . Emphysema   . Leaky heart valve   . Fatty liver   . TIA (transient ischemic attack)   . Hypertension   . Hyperlipidemia   . Neuropathy   . Diabetes mellitus 10/17/2011    newly dx today  . Slow urinary stream   . Rheumatic fever   . 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 Edema leg     right leg has leaky valve and right foot swells  . Excessive ear wax   . Hernia     near navel    Past Surgical History  Procedure Date  .  Lithotripsy   . Circumcision     Family History  Problem Relation Age of Onset  . Emphysema Mother   . Coronary artery disease Brother   . Coronary artery disease Brother   . Coronary artery disease Sister     History  Substance Use Topics  . Smoking status: Never Smoker   . Smokeless tobacco: Never Used  . Alcohol Use: No      Review of Systems  Constitutional: Negative for fever and chills.  HENT: Negative for sore throat, rhinorrhea and neck pain.   Eyes: Negative for pain, discharge, redness, itching and visual disturbance.  Respiratory: Positive for shortness of breath. Negative for cough and wheezing.   Cardiovascular: Positive for chest pain and leg swelling.  Gastrointestinal: Positive for nausea. Negative for vomiting.  Musculoskeletal: Negative for myalgias.  Skin: Negative for rash and wound.  Neurological: Positive for weakness and numbness. Negative for dizziness and headaches.    Allergies  Percocet  Home Medications   Current Outpatient Rx  Name Route Sig Dispense Refill  . ASPIRIN 81 MG PO TABS Oral Take 81 mg by mouth daily.      . ATORVASTATIN CALCIUM 20 MG PO TABS Oral Take 20 mg by mouth daily.      Marland Kitchen GABAPENTIN 300 MG PO CAPS Oral Take 300 mg by mouth 4 (four) times  daily as needed. Nerve pain.    Marland Kitchen METFORMIN HCL 500 MG PO TABS Oral Take 500 mg by mouth 2 (two) times daily with a meal.    . TERAZOSIN HCL 5 MG PO CAPS Oral Take 5 mg by mouth daily.    . AZITHROMYCIN 250 MG PO TABS Oral Take 1 tablet (250 mg total) by mouth daily. 4 tablet 0    BP 129/72  Pulse 68  Temp 98.2 F (36.8 C) (Oral)  Resp 18  Ht 5\' 7"  (1.702 m)  Wt 188 lb (85.276 kg)  BMI 29.44 kg/m2  SpO2 97%  Physical Exam  Constitutional: He is oriented to person, place, and time. He appears well-developed and well-nourished.  Eyes: Pupils are equal, round, and reactive to light.       Left upper eyelid drooping.  Does not cover pupil  Neck: Normal range of motion.    Cardiovascular: Normal rate and regular rhythm.   Pulmonary/Chest: Effort normal and breath sounds normal. No respiratory distress. He has no wheezes. He has no rales.  Abdominal: Soft. Bowel sounds are normal. He exhibits no distension.  Musculoskeletal: Normal range of motion. He exhibits edema. He exhibits no tenderness.       Per wife, edema in the right leg is stable and has been persistent for the last several years  Neurological: He is alert and oriented to person, place, and time.  Skin: Skin is warm.    ED Course  Procedures (including critical care time)  Labs Reviewed  GLUCOSE, CAPILLARY - Abnormal; Notable for the following:    Glucose-Capillary 112 (*)     All other components within normal limits  CBC WITH DIFFERENTIAL - Abnormal; Notable for the following:    RBC 3.98 (*)     Hemoglobin 11.6 (*)     HCT 35.0 (*)     All other components within normal limits  COMPREHENSIVE METABOLIC PANEL  POCT I-STAT TROPONIN I   Dg Chest 2 View  04/05/2012  *RADIOLOGY REPORT*  Clinical Data: Short of breath.  Chest pain.  Emphysema.  CHEST - 2 VIEW  Comparison: 12/07/2011 and multiple previous  Findings: Artifact overlies chest.  The heart is at the upper limits of normal in size.  There is linear scarring at the lung bases.  Markings are more prominent at the left base than were seen previously and there may be active atelectasis or pneumonia in this location.  No edema.  No effusions.  No significant bony finding.  IMPRESSION: Chronically abnormal interstitial lung markings.  These are more prominent at the left base than were seen previously.  Therefore, this could represent active atelectasis or pneumonia.   Original Report Authenticated By: Thomasenia Sales, M.D.    Ct Head Wo Contrast  04/05/2012  *RADIOLOGY REPORT*  Clinical Data: Left-sided weakness  CT HEAD WITHOUT CONTRAST  Technique:  Contiguous axial images were obtained from the base of the skull through the vertex without  contrast.  Comparison: MRI 10/17/2011.  Head CT 02/26/2008.  Findings: Moderate generalized atrophy is again evident.  No sign of old or acute focal infarction, mass lesion, hemorrhage, hydrocephalus or extra-axial collection.  Sinuses are clear.  No calvarial abnormality.  There is fluid in the left mastoid air cells.  IMPRESSION: Brain atrophy.  No focal or acute finding.  Left mastoid fluid.   Original Report Authenticated By: Thomasenia Sales, M.D.      1. CAP (community acquired pneumonia)     ED ECG  REPORT   Date: 04/05/2012  EKG Time: 10:27 PM  Rate: 70  Rhythm: normal sinus rhythm,  unchanged from previous tracings  Axis: normal  Intervals:right bundle branch block  ST&T Change: none, LVH  Narrative Interpretation: abnormal             MDM  Xray reviewed, will start antibiotics for CAP Head CT Scan labs and EKG reviewed with no indication for CVA or cardiac involvement.         Arman Filter, NP 04/05/12 2227  Arman Filter, NP 04/05/12 2228

## 2012-04-06 NOTE — ED Provider Notes (Signed)
History/physical exam/procedure(s) were performed by non-physician practitioner and as supervising physician I was immediately available for consultation/collaboration. I have reviewed all notes and am in agreement with care and plan.   Hilario Quarry, MD 04/06/12 [phone]

## 2012-09-19 IMAGING — CR DG CHEST 2V
2 series · 2 of 2 positions shown · non-contrast
Comparison: 03/03/2011

CLINICAL DATA: Shortness of breath, cough

CHEST - 2 VIEW

[w chest lat]
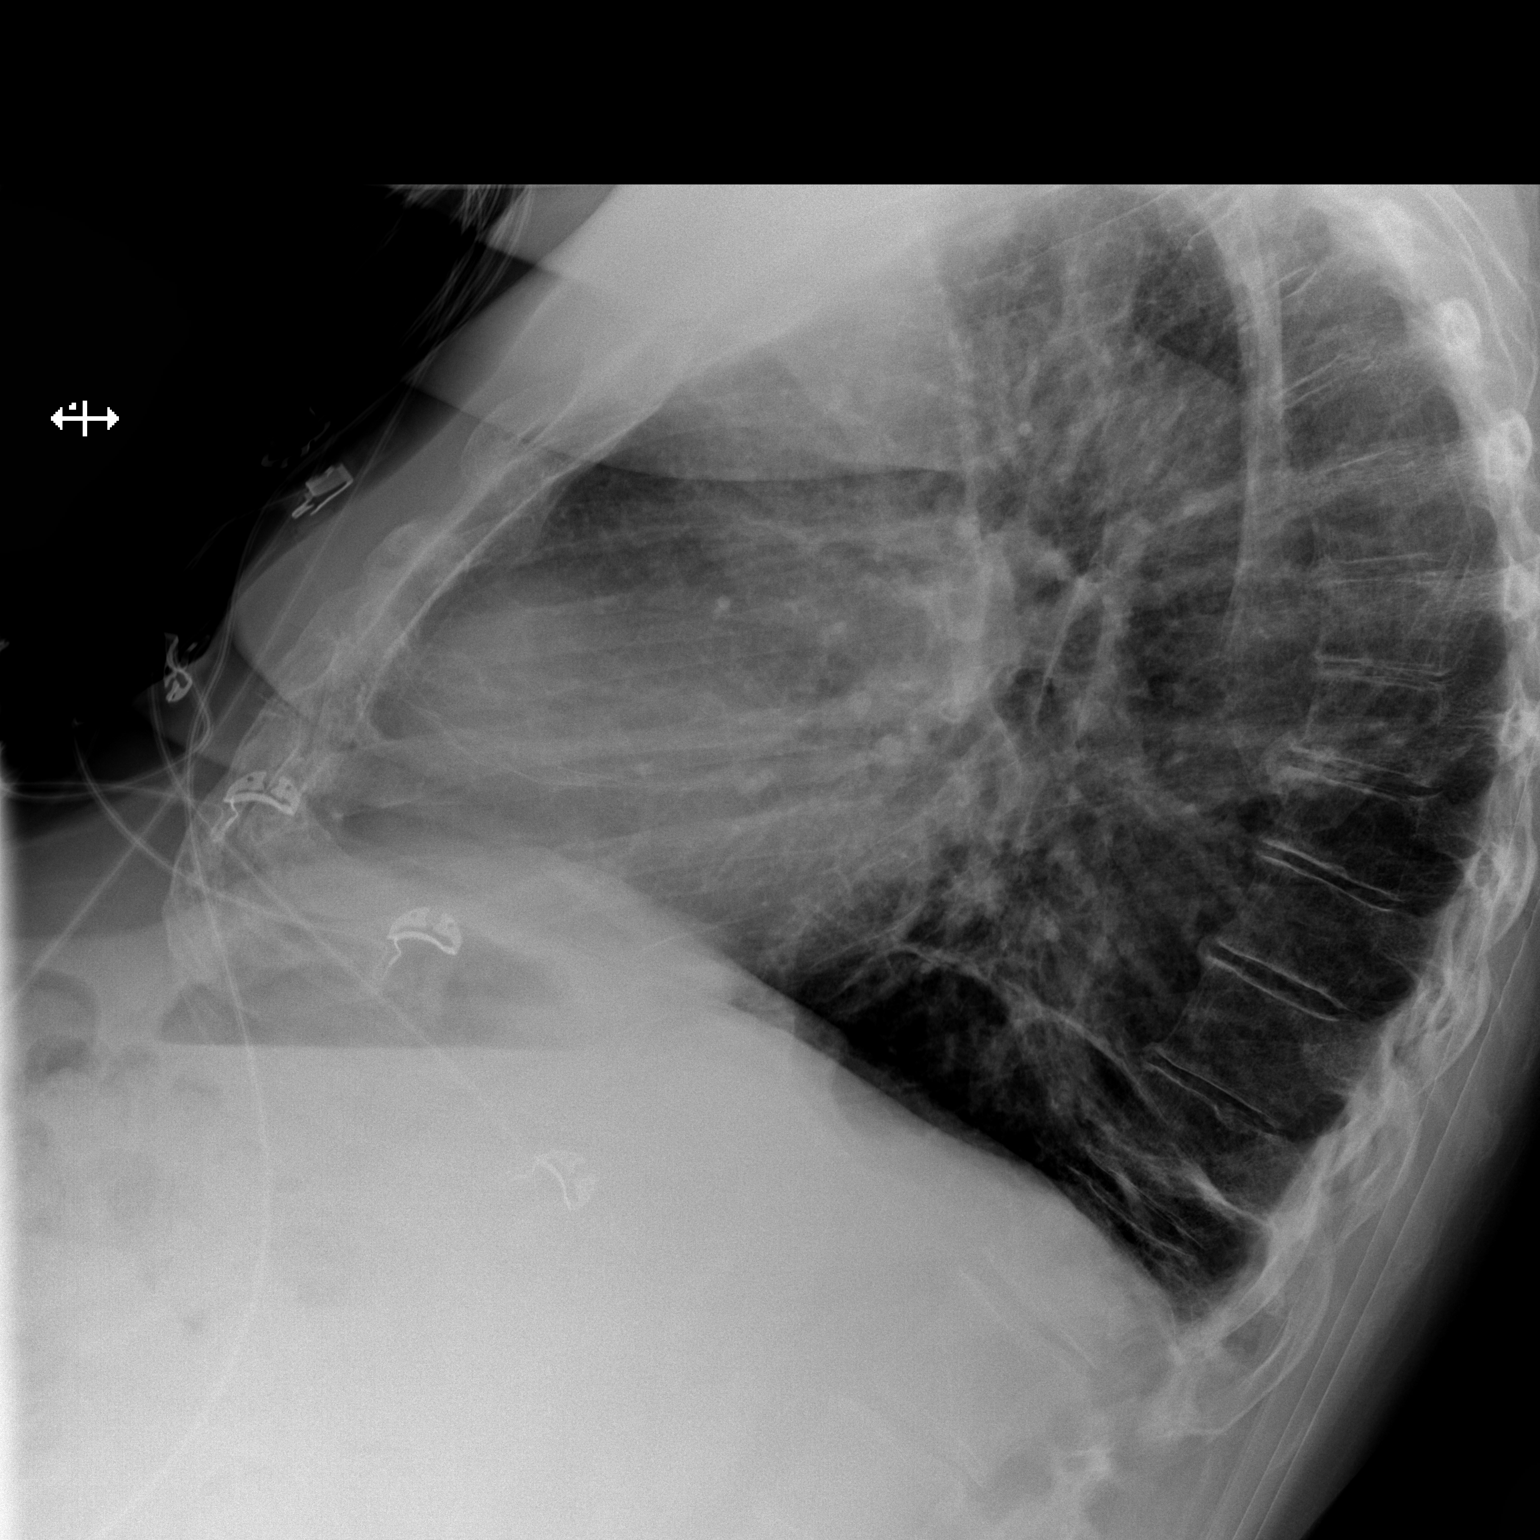

[x chest ap]
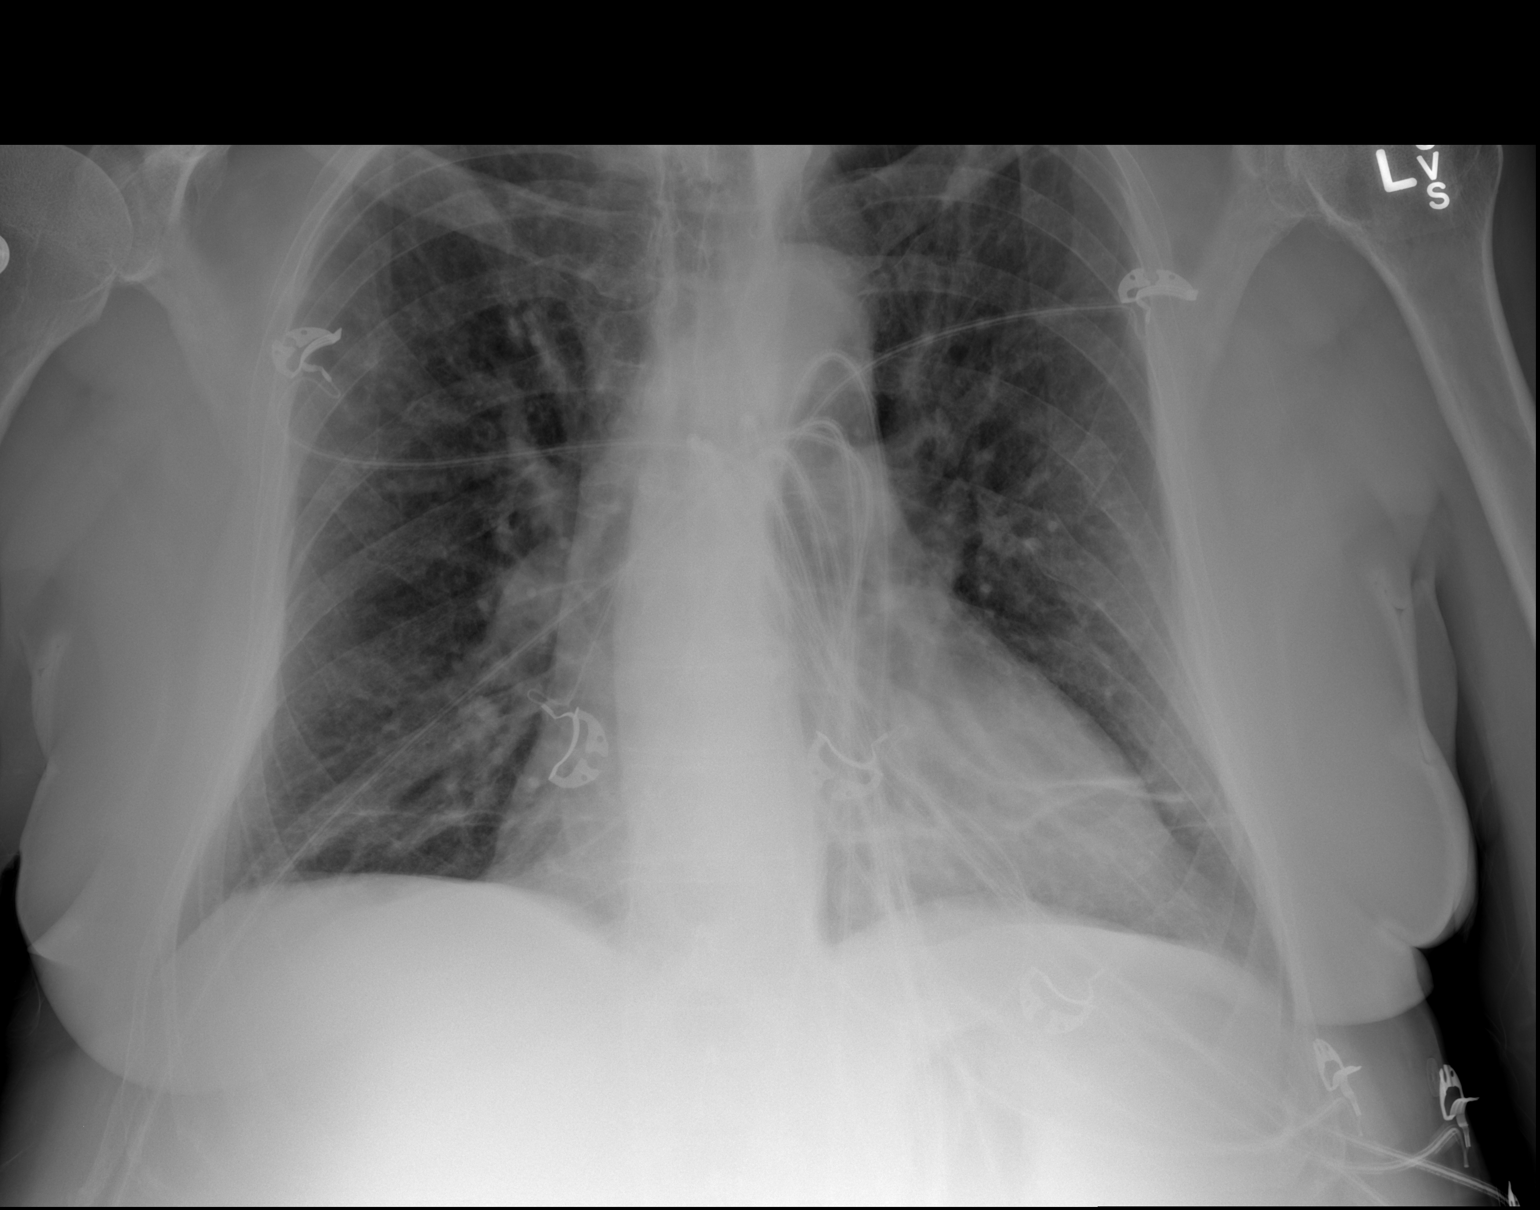

[2 of 2 positions shown; findings below may reference images not displayed]

FINDINGS: Chronic interstitial markings.  No frank interstitial
edema.  Bilateral lower lobe scarring.

Stable cardiomegaly.

Mild degenerative changes of the visualized thoracolumbar spine.
IMPRESSION: Chronic interstitial markings.  No frank interstitial edema.

Stable cardiomegaly.

## 2012-10-29 ENCOUNTER — Telehealth: Payer: Self-pay | Admitting: Cardiovascular Disease

## 2012-10-29 NOTE — Telephone Encounter (Signed)
Patients wife delivered words of praise to Dr. Excell Seltzer [facility]), Neal Dy ([employer]) and Wilder Glade (HIM Technician) for her reason difficulties applying for [employer] for her husband Terrius. Along with the praise was a high concern with the medical records department misrepresenting Dr. Excell Seltzer by taking over 90 days to complete a request for medical records.  After calling Miss Brasil back and making several phone calls to the underwriter [employer] [phone] and Corrie Dandy at [facility] [phone] it was discovered the request was not sent to the third party records retrieval processor [facility]) in a timely manner. Our requests were performed in a timely matter and [employer] retrievals has agreed to provide written confirmation of the dates confirming our stand.    I called Miss Begue back to update her on the findings of our research. rmf

## 2013-04-12 ENCOUNTER — Encounter (HOSPITAL_COMMUNITY): Payer: Self-pay | Admitting: Emergency Medicine

## 2013-04-12 ENCOUNTER — Emergency Department (HOSPITAL_COMMUNITY)
Admission: EM | Admit: 2013-04-12 | Discharge: 2013-04-12 | Disposition: A | Discharge status: Home / Self Care | Payer: Worker's Compensation | Attending: Emergency Medicine | Admitting: Emergency Medicine

## 2013-04-12 DIAGNOSIS — I10 Essential (primary) hypertension: Secondary | ICD-10-CM | POA: Insufficient documentation

## 2013-04-12 DIAGNOSIS — Y939 Activity, unspecified: Secondary | ICD-10-CM | POA: Insufficient documentation

## 2013-04-12 DIAGNOSIS — S0990XA Unspecified injury of head, initial encounter: Secondary | ICD-10-CM | POA: Insufficient documentation

## 2013-04-12 DIAGNOSIS — Z7982 Long term (current) use of aspirin: Secondary | ICD-10-CM | POA: Insufficient documentation

## 2013-04-12 DIAGNOSIS — Z8719 Personal history of other diseases of the digestive system: Secondary | ICD-10-CM | POA: Insufficient documentation

## 2013-04-12 DIAGNOSIS — E785 Hyperlipidemia, unspecified: Secondary | ICD-10-CM | POA: Insufficient documentation

## 2013-04-12 DIAGNOSIS — Z8673 Personal history of transient ischemic attack (TIA), and cerebral infarction without residual deficits: Secondary | ICD-10-CM | POA: Insufficient documentation

## 2013-04-12 DIAGNOSIS — Y990 Civilian activity done for income or pay: Secondary | ICD-10-CM | POA: Insufficient documentation

## 2013-04-12 DIAGNOSIS — Z8669 Personal history of other diseases of the nervous system and sense organs: Secondary | ICD-10-CM | POA: Insufficient documentation

## 2013-04-12 DIAGNOSIS — E119 Type 2 diabetes mellitus without complications: Secondary | ICD-10-CM | POA: Insufficient documentation

## 2013-04-12 DIAGNOSIS — IMO0002 Reserved for concepts with insufficient information to code with codable children: Secondary | ICD-10-CM | POA: Insufficient documentation

## 2013-04-12 DIAGNOSIS — Z8709 Personal history of other diseases of the respiratory system: Secondary | ICD-10-CM | POA: Insufficient documentation

## 2013-04-12 DIAGNOSIS — Y929 Unspecified place or not applicable: Secondary | ICD-10-CM | POA: Insufficient documentation

## 2013-04-12 DIAGNOSIS — Z79899 Other long term (current) drug therapy: Secondary | ICD-10-CM | POA: Insufficient documentation

## 2013-04-12 NOTE — ED Provider Notes (Signed)
CSN: [id number]     Arrival date & time 04/12/13  1403 History   First MD Initiated Contact with Patient 04/12/13 1447     Chief Complaint  Patient presents with  . Head Injury   (Consider location/radiation/quality/duration/timing/severity/associated sxs/prior Treatment) Patient is a 62 y.o. male presenting with head injury. The history is provided by the patient.  Head Injury  Patient here today after striking his head against the tailgate of a pickup truck. No loss of consciousness. No confusion or vomiting since the incident. Denies any neck pain. Did note some swelling to the left parietal area. Used ice and feels better. Did have some slight blurred vision but denies any vision loss. According to coworker who is here with the patient, patient has been his baseline. No other treatments used prior to arrival Past Medical History  Diagnosis Date  . Emphysema   . Leaky heart valve   . Fatty liver   . TIA (transient ischemic attack)   . Hypertension   . Hyperlipidemia   . Neuropathy   . Diabetes mellitus 10/17/2011    newly dx today  . Slow urinary stream   . Rheumatic fever   . 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 Edema leg     right leg has leaky valve and right foot swells  . Excessive ear wax   . Hernia     near navel   Past Surgical History  Procedure Laterality Date  . Lithotripsy    . Circumcision     Family History  Problem Relation Age of Onset  . Emphysema Mother   . Coronary artery disease Brother   . Coronary artery disease Brother   . Coronary artery disease Sister    History  Substance Use Topics  . Smoking status: Never Smoker   . Smokeless tobacco: Never Used  . Alcohol Use: No    Review of Systems  All other systems reviewed and are negative.    Allergies  Percocet  Home Medications   Current Outpatient Rx  Name  Route  Sig  Dispense  Refill  . aspirin 81 MG tablet   Oral   Take 81 mg by mouth  daily.           Marland Kitchen atorvastatin (LIPITOR) 20 MG tablet   Oral   Take 20 mg by mouth every evening.          . gabapentin (NEURONTIN) 300 MG capsule   Oral   Take 300-600 mg by mouth 2 (two) times daily. Takes 2 in the morning and 1 at night         . metFORMIN (GLUCOPHAGE) 500 MG tablet   Oral   Take 500 mg by mouth 2 (two) times daily with a meal.         . terazosin (HYTRIN) 5 MG capsule   Oral   Take 5 mg by mouth daily.          BP 145/76  Pulse 73  Temp(Src) 98.6 F (37 C) (Oral)  Resp 18  Ht 5\' 7"  (1.702 m)  Wt 190 lb (86.183 kg)  BMI 29.75 kg/m2  SpO2 96% Physical Exam  Nursing note and vitals reviewed. Constitutional: He is oriented to person, place, and time. He appears well-developed and well-nourished.  Non-toxic appearance. No distress.  HENT:  Head: Normocephalic and atraumatic.    Eyes: Conjunctivae, EOM and lids are normal. Pupils are equal, round, and  reactive to light.  Neck: Normal range of motion. Neck supple. No tracheal deviation present. No mass present.  Cardiovascular: Normal rate, regular rhythm and normal heart sounds.  Exam reveals no gallop.   No murmur heard. Pulmonary/Chest: Effort normal and breath sounds normal. No stridor. No respiratory distress. He has no decreased breath sounds. He has no wheezes. He has no rhonchi. He has no rales.  Abdominal: Soft. Normal appearance and bowel sounds are normal. He exhibits no distension. There is no tenderness. There is no rebound and no CVA tenderness.  Musculoskeletal: Normal range of motion. He exhibits no edema and no tenderness.  Neurological: He is alert and oriented to person, place, and time. He has normal strength. No cranial nerve deficit or sensory deficit. GCS eye subscore is 4. GCS verbal subscore is 5. GCS motor subscore is 6.  Skin: Skin is warm and dry. No abrasion and no rash noted.  Psychiatric: He has a normal mood and affect. His speech is normal and behavior is normal.     ED Course  Procedures (including critical care time) Labs Review Labs Reviewed - No data to display Imaging Review No results found.  EKG Interpretation   None       MDM  No diagnosis found. Patient was mild hematoma noted the scalp without signs of neurological impairment. No need for head CT at this time. Stable for discharge    Toy Baker, MD 04/12/13 1501

## 2013-04-12 NOTE — ED Notes (Addendum)
Pt c/o of hitting left side of  head on the tail gate of a van while at work [employer]). No LOC but pt currently experiencing a headache of left eye with what patient states "weakness in my eye" and blurred vision.

## 2013-10-10 ENCOUNTER — Emergency Department (HOSPITAL_COMMUNITY): Payer: BC Managed Care – PPO

## 2013-10-10 ENCOUNTER — Observation Stay (HOSPITAL_COMMUNITY)
Admission: EM | Admit: 2013-10-10 | Discharge: 2013-10-11 | Disposition: A | Discharge status: Home / Self Care | Payer: BC Managed Care – PPO | Attending: Internal Medicine | Admitting: Internal Medicine

## 2013-10-10 ENCOUNTER — Encounter (HOSPITAL_COMMUNITY): Payer: Self-pay | Admitting: Emergency Medicine

## 2013-10-10 DIAGNOSIS — R079 Chest pain, unspecified: Principal | ICD-10-CM

## 2013-10-10 DIAGNOSIS — Z79899 Other long term (current) drug therapy: Secondary | ICD-10-CM | POA: Insufficient documentation

## 2013-10-10 DIAGNOSIS — M21372 Foot drop, left foot: Secondary | ICD-10-CM

## 2013-10-10 DIAGNOSIS — I351 Nonrheumatic aortic (valve) insufficiency: Secondary | ICD-10-CM

## 2013-10-10 DIAGNOSIS — H6121 Impacted cerumen, right ear: Secondary | ICD-10-CM

## 2013-10-10 DIAGNOSIS — Z8249 Family history of ischemic heart disease and other diseases of the circulatory system: Secondary | ICD-10-CM | POA: Insufficient documentation

## 2013-10-10 DIAGNOSIS — R142 Eructation: Secondary | ICD-10-CM | POA: Insufficient documentation

## 2013-10-10 DIAGNOSIS — R0600 Dyspnea, unspecified: Secondary | ICD-10-CM

## 2013-10-10 DIAGNOSIS — E119 Type 2 diabetes mellitus without complications: Secondary | ICD-10-CM

## 2013-10-10 DIAGNOSIS — G629 Polyneuropathy, unspecified: Secondary | ICD-10-CM

## 2013-10-10 DIAGNOSIS — R141 Gas pain: Secondary | ICD-10-CM | POA: Insufficient documentation

## 2013-10-10 DIAGNOSIS — I10 Essential (primary) hypertension: Secondary | ICD-10-CM | POA: Diagnosis present

## 2013-10-10 DIAGNOSIS — R739 Hyperglycemia, unspecified: Secondary | ICD-10-CM

## 2013-10-10 DIAGNOSIS — Z8673 Personal history of transient ischemic attack (TIA), and cerebral infarction without residual deficits: Secondary | ICD-10-CM | POA: Insufficient documentation

## 2013-10-10 DIAGNOSIS — E785 Hyperlipidemia, unspecified: Secondary | ICD-10-CM | POA: Insufficient documentation

## 2013-10-10 DIAGNOSIS — Z7982 Long term (current) use of aspirin: Secondary | ICD-10-CM | POA: Insufficient documentation

## 2013-10-10 DIAGNOSIS — R143 Flatulence: Secondary | ICD-10-CM

## 2013-10-10 DIAGNOSIS — R0602 Shortness of breath: Secondary | ICD-10-CM

## 2013-10-10 DIAGNOSIS — J438 Other emphysema: Secondary | ICD-10-CM | POA: Insufficient documentation

## 2013-10-10 LAB — CBC WITH DIFFERENTIAL/PLATELET
BASOS PCT: 1 % (ref 0–1)
Basophils Absolute: 0.1 10*3/uL (ref 0.0–0.1)
Eosinophils Absolute: 0.3 10*3/uL (ref 0.0–0.7)
Eosinophils Relative: 5 % (ref 0–5)
HEMATOCRIT: 36.5 % — AB (ref 39.0–52.0)
HEMOGLOBIN: 11.8 g/dL — AB (ref 13.0–17.0)
Lymphocytes Relative: 21 % (ref 12–46)
Lymphs Abs: 1.4 10*3/uL (ref 0.7–4.0)
MCH: 29.4 pg (ref 26.0–34.0)
MCHC: 32.3 g/dL (ref 30.0–36.0)
MCV: 90.8 fL (ref 78.0–100.0)
MONO ABS: 0.3 10*3/uL (ref 0.1–1.0)
MONOS PCT: 5 % (ref 3–12)
NEUTROS ABS: 4.6 10*3/uL (ref 1.7–7.7)
Neutrophils Relative %: 69 % (ref 43–77)
Platelets: 238 10*3/uL (ref 150–400)
RBC: 4.02 MIL/uL — ABNORMAL LOW (ref 4.22–5.81)
RDW: 13.9 % (ref 11.5–15.5)
WBC: 6.7 10*3/uL (ref 4.0–10.5)

## 2013-10-10 LAB — BASIC METABOLIC PANEL
BUN: 15 mg/dL (ref 6–23)
CALCIUM: 8.9 mg/dL (ref 8.4–10.5)
CO2: 26 meq/L (ref 19–32)
Chloride: 101 mEq/L (ref 96–112)
Creatinine, Ser: 0.86 mg/dL (ref 0.50–1.35)
GFR calc Af Amer: >90 mL/min (ref >90)
GFR calc non Af Amer: >90 mL/min (ref >90)
GLUCOSE: 177 mg/dL — AB (ref 70–99)
Potassium: 4.1 mEq/L (ref 3.7–5.3)
SODIUM: 140 meq/L (ref 137–147)

## 2013-10-10 LAB — TROPONIN I

## 2013-10-10 LAB — APTT: aPTT: 28 seconds (ref 24–37)

## 2013-10-10 LAB — GLUCOSE, CAPILLARY
GLUCOSE-CAPILLARY: 100 mg/dL — AB (ref 70–99)
GLUCOSE-CAPILLARY: 157 mg/dL — AB (ref 70–99)
Glucose-Capillary: 86 mg/dL (ref 70–99)

## 2013-10-10 LAB — HEMOGLOBIN A1C
Hgb A1c MFr Bld: 6.6 % — ABNORMAL HIGH (ref <5.7)
Mean Plasma Glucose: 143 mg/dL — ABNORMAL HIGH (ref <117)

## 2013-10-10 LAB — I-STAT TROPONIN, ED: Troponin i, poc: 0 ng/mL (ref 0.00–0.08)

## 2013-10-10 LAB — PROTIME-INR
INR: 1.02 (ref 0.00–1.49)
Prothrombin Time: 13.2 seconds (ref 11.6–15.2)

## 2013-10-10 LAB — PRO B NATRIURETIC PEPTIDE: Pro B Natriuretic peptide (BNP): 178.3 pg/mL — ABNORMAL HIGH (ref 0–125)

## 2013-10-10 LAB — TSH: TSH: 1.26 u[IU]/mL (ref 0.350–4.500)

## 2013-10-10 MED ORDER — SODIUM CHLORIDE 0.9 % IV SOLN: INTRAVENOUS | Status: DC

## 2013-10-10 MED ORDER — ATORVASTATIN CALCIUM 20 MG PO TABS
20.0000 mg | ORAL_TABLET | Freq: Every evening | ORAL | Status: DC
  Administered 2013-10-10: 20 mg via ORAL
  Filled 2013-10-10 (×2): qty 1

## 2013-10-10 MED ORDER — ASPIRIN 81 MG PO CHEW
81.0000 mg | CHEWABLE_TABLET | Freq: Every day | ORAL | Status: DC
  Administered 2013-10-11: 81 mg via ORAL
  Filled 2013-10-10: qty 1

## 2013-10-10 MED ORDER — NITROGLYCERIN 0.4 MG SL SUBL: 0.4000 mg | SUBLINGUAL_TABLET | SUBLINGUAL | Status: DC | PRN

## 2013-10-10 MED ORDER — ONDANSETRON HCL 4 MG PO TABS: 4.0000 mg | ORAL_TABLET | Freq: Four times a day (QID) | ORAL | Status: DC | PRN

## 2013-10-10 MED ORDER — TERAZOSIN HCL 5 MG PO CAPS
5.0000 mg | ORAL_CAPSULE | Freq: Every day | ORAL | Status: DC
  Administered 2013-10-11: 5 mg via ORAL
  Filled 2013-10-10: qty 1

## 2013-10-10 MED ORDER — ONDANSETRON HCL 4 MG/2ML IJ SOLN: 4.0000 mg | Freq: Four times a day (QID) | INTRAMUSCULAR | Status: DC | PRN

## 2013-10-10 MED ORDER — INSULIN ASPART 100 UNIT/ML ~~LOC~~ SOLN
0.0000 [IU] | Freq: Three times a day (TID) | SUBCUTANEOUS | Status: DC
Start: 2013-10-10 — End: 2013-10-11

## 2013-10-10 MED ORDER — SENNOSIDES-DOCUSATE SODIUM 8.6-50 MG PO TABS: 1.0000 | ORAL_TABLET | Freq: Every evening | ORAL | Status: DC | PRN

## 2013-10-10 MED ORDER — SODIUM CHLORIDE 0.9 % IV BOLUS (SEPSIS)
1000.0000 mL | Freq: Once | INTRAVENOUS | Status: AC
  Administered 2013-10-10: 1000 mL via INTRAVENOUS

## 2013-10-10 MED ORDER — ACETAMINOPHEN 650 MG RE SUPP: 650.0000 mg | Freq: Four times a day (QID) | RECTAL | Status: DC | PRN

## 2013-10-10 MED ORDER — ENOXAPARIN SODIUM 40 MG/0.4ML ~~LOC~~ SOLN
40.0000 mg | SUBCUTANEOUS | Status: DC
  Filled 2013-10-10: qty 0.4

## 2013-10-10 MED ORDER — GABAPENTIN 300 MG PO CAPS
300.0000 mg | ORAL_CAPSULE | Freq: Every day | ORAL | Status: DC
  Administered 2013-10-10: 300 mg via ORAL
  Filled 2013-10-10 (×2): qty 1

## 2013-10-10 MED ORDER — ENOXAPARIN SODIUM 40 MG/0.4ML ~~LOC~~ SOLN
40.0000 mg | SUBCUTANEOUS | Status: DC
  Administered 2013-10-10: 40 mg via SUBCUTANEOUS
  Filled 2013-10-10 (×2): qty 0.4

## 2013-10-10 MED ORDER — GABAPENTIN 300 MG PO CAPS
600.0000 mg | ORAL_CAPSULE | Freq: Every day | ORAL | Status: DC
  Administered 2013-10-11: 600 mg via ORAL
  Filled 2013-10-10: qty 2

## 2013-10-10 MED ORDER — HEPARIN (PORCINE) IN NACL 100-0.45 UNIT/ML-% IJ SOLN
1100.0000 [IU]/h | INTRAMUSCULAR | Status: DC
  Administered 2013-10-10: 1100 [IU]/h via INTRAVENOUS
  Filled 2013-10-10: qty 250

## 2013-10-10 MED ORDER — HEPARIN BOLUS VIA INFUSION
4000.0000 [IU] | Freq: Once | INTRAVENOUS | Status: AC
  Administered 2013-10-10: 4000 [IU] via INTRAVENOUS
  Filled 2013-10-10: qty 4000

## 2013-10-10 MED ORDER — SODIUM CHLORIDE 0.9 % IJ SOLN
3.0000 mL | Freq: Two times a day (BID) | INTRAMUSCULAR | Status: DC
  Administered 2013-10-10 – 2013-10-11 (×2): 3 mL via INTRAVENOUS

## 2013-10-10 MED ORDER — ACETAMINOPHEN 325 MG PO TABS: 650.0000 mg | ORAL_TABLET | Freq: Four times a day (QID) | ORAL | Status: DC | PRN

## 2013-10-10 MED ORDER — ASPIRIN 81 MG PO CHEW
283.0000 mg | CHEWABLE_TABLET | Freq: Once | ORAL | Status: AC
  Administered 2013-10-10: 283 mg via ORAL
  Filled 2013-10-10: qty 4

## 2013-10-10 NOTE — Consult Note (Signed)
Referring Physician: [facility] Cardiologist: Excell Seltzer Reason for Consultation:  CP   HPI:  63 y/o Male with h/o HTN, HL, DM2, ascending aortic aneurysm with mild AI and strong FHX of CAD. We are asked to see for recurrent CP.   Underwent cath in 9/12 for CP. Showed normal LV function. 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   The left main is patent. LAD: The LAD is widely patent throughout its course. There is no  significant obstructive disease. There is a moderate caliber first  diagonal with mild irregularity at its origin.  Left circumflex: The left circumflex is patent. It gives off two OM  branches where there is about 30% stenosis in both branches at their  bifurcation point. There is no high-grade disease throughout the left  circumflex distribution.  Right coronary artery: There is a mild eccentric plaque in the proximal  right coronary artery that appears no more than 30% stenosed. The  vessel gives off a PDA and it is patent. The mid and distal vessel are  widely patent.  Presented to [facility] this am stating that he woke from sleep at 630 am with indigestion and belching associated with dyspnea and very mild chest discomfort. Came to ER was CP free. ECG and trop negative. Now asx. Denies exertional CP.    Trop < 0.30  ECG: NSR 85. IVCD in LBBB patter. No ST-T wave abnormalities. +PVCs    Review of Systems:     Cardiac Review of Systems: {Y] = yes [ ]  = no  Chest Pain [  y ]  Resting SOB [ y  ] Exertional SOB  [  ]  Orthopnea [  ]   Pedal Edema [   ]    Palpitations [  ] Syncope  [  ]   Presyncope [   ]  General Review of Systems: [Y] = yes [  ]=no Constitional: recent weight change [  ]; anorexia [  ]; fatigue [  ]; nausea [  ]; night sweats [  ]; fever [  ]; or chills [  ];                                                                      Eyes : blurred vision [  ]; diplopia [   ]; vision changes [  ];  Amaurosis fugax[  ]; Resp: cough [  ];  wheezing[  ];   hemoptysis[  ];  PND [  ];  GI:  gallstones[  ], vomiting[  ];  dysphagia[  ]; melena[  ];  hematochezia [  ]; heartburn[  ];   GU: kidney stones [  ]; hematuria[  ];   dysuria [  ];  nocturia[  ]; incontinence [  ];             Skin: rash, swelling[  ];, hair loss[  ];  peripheral edema[  ];  or itching[  ]; Musculosketetal: myalgias[  ];  joint swelling[  ];  joint erythema[  ];  joint pain[  ];  back pain[  ];  Heme/Lymph: bruising[  ];  bleeding[  ];  anemia[  ];  Neuro: TIA[  ];  headaches[  ];  stroke[  ];  vertigo[  ];  seizures[  ];   paresthesias[  ];  difficulty walking[  ];  Psych:depression[  ]; anxiety[  ];  Endocrine: diabetes[y  ];  thyroid dysfunction[  ];  Other:  Past Medical History  Diagnosis Date  . Emphysema   . Leaky heart valve   . Fatty liver   . TIA (transient ischemic attack)   . Hypertension   . Hyperlipidemia   . Neuropathy   . Diabetes mellitus 10/17/2011    newly dx today  . Slow urinary stream   . Rheumatic fever   . 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 Edema leg     right leg has leaky valve and right foot swells  . Excessive ear wax   . Hernia     near navel    Medications Prior to Admission  Medication Sig Dispense Refill  . aspirin 81 MG tablet Take 81 mg by mouth daily.        Marland Kitchen atorvastatin (LIPITOR) 20 MG tablet Take 20 mg by mouth every evening.       . furosemide (LASIX) 20 MG tablet Take 20 mg by mouth daily as needed for fluid.      Marland Kitchen gabapentin (NEURONTIN) 300 MG capsule Take 300 mg by mouth 2 (two) times daily. Takes 2 in the morning and 1 at night      . metFORMIN (GLUCOPHAGE) 500 MG tablet Take 500 mg by mouth 2 (two) times daily with a meal.      . terazosin (HYTRIN) 5 MG capsule Take 5 mg by mouth daily.         Melene Muller ON 10/11/2013] aspirin  81 mg Oral Daily  . atorvastatin  20 mg Oral QPM  . gabapentin  300 mg Oral q1800  . [START ON 10/11/2013] gabapentin  600 mg Oral Daily  .  insulin aspart  0-9 Units Subcutaneous TID WC  . sodium chloride  3 mL Intravenous Q12H  . [START ON 10/11/2013] terazosin  5 mg Oral Daily    Infusions: . sodium chloride 50 mL/hr at 10/10/13 1437  . heparin 1,100 Units/hr (10/10/13 1436)    Allergies  Allergen Reactions  . Percocet [Oxycodone-Acetaminophen] Other (See Comments)    Hallucintaions    History   Social History  . Marital Status: Married    Spouse Name: N/A    Number of Children: N/A  . Years of Education: N/A   Occupational History  . Not on file.   Social History Main Topics  . Smoking status: Never Smoker   . Smokeless tobacco: Never Used  . Alcohol Use: No  . Drug Use: No  . Sexual Activity: Not on file   Other Topics Concern  . Not on file   Social History Narrative  . No narrative on file    Family History  Problem Relation Age of Onset  . Emphysema Mother   . Coronary artery disease Brother   . Coronary artery disease Brother   . Coronary artery disease Sister     PHYSICAL EXAM: Filed Vitals:   10/10/13 1054  BP: 158/85  Pulse: 82  Temp: 97.9 F (36.6 C)  Resp: 18     Intake/Output Summary (Last 24 hours) at 10/10/13 1534 Last data filed at 10/10/13 1207  Gross per 24 hour  Intake      0 ml  Output    500 ml  Net   -500  ml    General:  Sitting in chair No respiratory difficulty HEENT: normal Neck: supple. no JVD. Carotids 2+ bilat; no bruits. No lymphadenopathy or thryomegaly appreciated. Cor: PMI nondisplaced. Regular rate & rhythm. No rubs, gallops or murmurs. Lungs: clear Abdomen: soft, nontender, nondistended. No hepatosplenomegaly. No bruits or masses. Good bowel sounds. Extremities: no cyanosis, clubbing, rash, edema Neuro: alert & oriented x 3, cranial nerves grossly intact. moves all 4 extremities w/o difficulty. Affect pleasant.   Results for orders placed during the hospital encounter of 10/10/13 (from the past 24 hour(s))  CBC WITH DIFFERENTIAL     Status:  Abnormal   Collection Time    10/10/13  8:38 AM      Result Value Ref Range   WBC 6.7  4.0 - 10.5 K/uL   RBC 4.02 (*) 4.22 - 5.81 MIL/uL   Hemoglobin 11.8 (*) 13.0 - 17.0 g/dL   HCT 40.9 (*) 81.1 - 91.4 %   MCV 90.8  78.0 - 100.0 fL   MCH 29.4  26.0 - 34.0 pg   MCHC 32.3  30.0 - 36.0 g/dL   RDW 78.2  95.6 - 21.3 %   Platelets 238  150 - 400 K/uL   Neutrophils Relative % 69  43 - 77 %   Neutro Abs 4.6  1.7 - 7.7 K/uL   Lymphocytes Relative 21  12 - 46 %   Lymphs Abs 1.4  0.7 - 4.0 K/uL   Monocytes Relative 5  3 - 12 %   Monocytes Absolute 0.3  0.1 - 1.0 K/uL   Eosinophils Relative 5  0 - 5 %   Eosinophils Absolute 0.3  0.0 - 0.7 K/uL   Basophils Relative 1  0 - 1 %   Basophils Absolute 0.1  0.0 - 0.1 K/uL  BASIC METABOLIC PANEL     Status: Abnormal   Collection Time    10/10/13  8:38 AM      Result Value Ref Range   Sodium 140  137 - 147 mEq/L   Potassium 4.1  3.7 - 5.3 mEq/L   Chloride 101  96 - 112 mEq/L   CO2 26  19 - 32 mEq/L   Glucose, Bld 177 (*) 70 - 99 mg/dL   BUN 15  6 - 23 mg/dL   Creatinine, Ser 0.86  0.50 - 1.35 mg/dL   Calcium 8.9  8.4 - 57.8 mg/dL   GFR calc non Af Amer >90  >90 mL/min   GFR calc Af Amer >90  >90 mL/min  I-STAT TROPOININ, ED     Status: None   Collection Time    10/10/13  8:49 AM      Result Value Ref Range   Troponin i, poc 0.00  0.00 - 0.08 ng/mL   Comment 3           PRO B NATRIURETIC PEPTIDE     Status: Abnormal   Collection Time    10/10/13  8:49 AM      Result Value Ref Range   Pro B Natriuretic peptide (BNP) 178.3 (*) 0 - 125 pg/mL  TROPONIN I     Status: None   Collection Time    10/10/13  1:40 PM      Result Value Ref Range   Troponin I <0.30  <0.30 ng/mL  APTT     Status: None   Collection Time    10/10/13  2:40 PM      Result Value Ref Range   aPTT  28  24 - 37 seconds  PROTIME-INR     Status: None   Collection Time    10/10/13  2:40 PM      Result Value Ref Range   Prothrombin Time 13.2  11.6 - 15.2 seconds   INR  1.02  0.00 - 1.49   Dg Chest 2 View  10/10/2013   CLINICAL DATA:  Short of breath.  Left-sided chest pain.  EXAM: CHEST  2 VIEW  COMPARISON:  04/05/2012  FINDINGS: Cardiac silhouette is normal in size and configuration. Mediastinum is normal in contour and caliber. No hilar masses or adenopathy.  Prominent bronchovascular markings. Lungs are hyperexpanded. There is coarse reticular left lung base opacity likely scarring or atelectasis. No lung consolidation is seen to suggest pneumonia. No pulmonary edema. No pleural effusion or pneumothorax.  The bony thorax is demineralized but grossly intact.  IMPRESSION: No acute cardiopulmonary disease.  Left lung base opacity is similar to the previous exam most likely atelectasis/ scarring.   Electronically Signed   By: Amie Portlandavid  Ormond M.D.   On: 10/10/2013 09:07     ASSESSMENT: 1. Inidgestion/dyspnea/cp 2. 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 DM2 4. FHX CAD 5. HTN 6. HL 7. Ascending aortic aneurysm (4.5 cm) with mild AI by echo 4/13   PLAN/DISCUSSION:  Main symptom is really indigestion. No objective signs ischemia. Cath 2012 with minimal CAD. We discussed stress testing versus repeat cath. He prefers chemical stress. Will arrange for lexiscan myoview in am. Check echo to reassess ascending aorta. Start PPI. Cant stop heparin and IVF.   Bevelyn BucklesDaniel R Bensimhon,MD 4:28 PM

## 2013-10-10 NOTE — ED Provider Notes (Signed)
CSN: [id number]     Arrival date & time 10/10/13  4098 History   First MD Initiated Contact with Patient 10/10/13 [phone]     Chief Complaint  Patient presents with  . Shortness of Breath     (Consider location/radiation/quality/duration/timing/severity/associated sxs/prior Treatment) HPI Comments: Patient presents to the ED with a chief complaint of SOB and pain that radiates to the left arm.  He states that the pain started this morning at around 0630.  The symptoms awoke him from sleep.  He reports associate nausea.  Denies associated centralized chest pain.  He took 81mg  of ASA this morning.  Currently, he denies any pain, but says that he is still short of breath.  Cardiac risk factors include HTN, DM, HL, smoking hx and CAD.  There are no aggravating or alleviating factors.  The history is provided by the patient. No language interpreter was used.    Past Medical History  Diagnosis Date  . Emphysema   . Leaky heart valve   . Fatty liver   . TIA (transient ischemic attack)   . Hypertension   . Hyperlipidemia   . Neuropathy   . Diabetes mellitus 10/17/2011    newly dx today  . Slow urinary stream   . Rheumatic fever   . 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 Edema leg     right leg has leaky valve and right foot swells  . Excessive ear wax   . Hernia     near navel   Past Surgical History  Procedure Laterality Date  . Lithotripsy    . Circumcision     Family History  Problem Relation Age of Onset  . Emphysema Mother   . Coronary artery disease Brother   . Coronary artery disease Brother   . Coronary artery disease Sister    History  Substance Use Topics  . Smoking status: Never Smoker   . Smokeless tobacco: Never Used  . Alcohol Use: No    Review of Systems  Constitutional: Negative for fever and chills.  Respiratory: Positive for shortness of breath. Negative for wheezing.   Cardiovascular: Negative for chest pain.   Gastrointestinal: Negative for nausea, vomiting, diarrhea and constipation.  Genitourinary: Negative for dysuria.      Allergies  Percocet  Home Medications   Current Outpatient Rx  Name  Route  Sig  Dispense  Refill  . aspirin 81 MG tablet   Oral   Take 81 mg by mouth daily.           Marland Kitchen atorvastatin (LIPITOR) 20 MG tablet   Oral   Take 20 mg by mouth every evening.          . gabapentin (NEURONTIN) 300 MG capsule   Oral   Take 300-600 mg by mouth 2 (two) times daily. Takes 2 in the morning and 1 at night         . metFORMIN (GLUCOPHAGE) 500 MG tablet   Oral   Take 500 mg by mouth 2 (two) times daily with a meal.         . terazosin (HYTRIN) 5 MG capsule   Oral   Take 5 mg by mouth daily.          BP 158/82  Pulse 83  Temp(Src) 98.8 F (37.1 C) (Oral)  Resp 18  SpO2 97% Physical Exam  Nursing note and vitals reviewed. Constitutional: He is oriented to person, place, and  time. He appears well-developed and well-nourished.  HENT:  Head: Normocephalic and atraumatic.  Eyes: Conjunctivae and EOM are normal. Pupils are equal, round, and reactive to light. Right eye exhibits no discharge. Left eye exhibits no discharge. No scleral icterus.  Neck: Normal range of motion. Neck supple. No JVD present.  Cardiovascular: Normal rate, regular rhythm and normal heart sounds.  Exam reveals no gallop and no friction rub.   No murmur heard. Pulmonary/Chest: Effort normal and breath sounds normal. No respiratory distress. He has no wheezes. He has no rales. He exhibits no tenderness.  Abdominal: Soft. He exhibits no distension and no mass. There is no tenderness. There is no rebound and no guarding.  Umbilical abdominal hernia, easily reducible, No focal abdominal tenderness, no RLQ tenderness or pain at McBurney's point, no RUQ tenderness or Murphy's sign, no left-sided abdominal tenderness, no fluid wave, or signs of peritonitis   Musculoskeletal: Normal range of  motion. He exhibits no edema and no tenderness.  Neurological: He is alert and oriented to person, place, and time.  Skin: Skin is warm and dry.  Psychiatric: He has a normal mood and affect. His behavior is normal. Judgment and thought content normal.    ED Course  Procedures (including critical care time) Results for orders placed during the hospital encounter of 10/10/13  CBC WITH DIFFERENTIAL      Result Value Ref Range   WBC 6.7  4.0 - 10.5 K/uL   RBC 4.02 (*) 4.22 - 5.81 MIL/uL   Hemoglobin 11.8 (*) 13.0 - 17.0 g/dL   HCT 19.136.5 (*) 47.839.0 - 29.552.0 %   MCV 90.8  78.0 - 100.0 fL   MCH 29.4  26.0 - 34.0 pg   MCHC 32.3  30.0 - 36.0 g/dL   RDW 62.113.9  30.811.5 - 65.715.5 %   Platelets 238  150 - 400 K/uL   Neutrophils Relative % 69  43 - 77 %   Neutro Abs 4.6  1.7 - 7.7 K/uL   Lymphocytes Relative 21  12 - 46 %   Lymphs Abs 1.4  0.7 - 4.0 K/uL   Monocytes Relative 5  3 - 12 %   Monocytes Absolute 0.3  0.1 - 1.0 K/uL   Eosinophils Relative 5  0 - 5 %   Eosinophils Absolute 0.3  0.0 - 0.7 K/uL   Basophils Relative 1  0 - 1 %   Basophils Absolute 0.1  0.0 - 0.1 K/uL  BASIC METABOLIC PANEL      Result Value Ref Range   Sodium 140  137 - 147 mEq/L   Potassium 4.1  3.7 - 5.3 mEq/L   Chloride 101  96 - 112 mEq/L   CO2 26  19 - 32 mEq/L   Glucose, Bld 177 (*) 70 - 99 mg/dL   BUN 15  6 - 23 mg/dL   Creatinine, Ser 8.460.86  0.50 - 1.35 mg/dL   Calcium 8.9  8.4 - 96.210.5 mg/dL   GFR calc non Af Amer >90  >90 mL/min   GFR calc Af Amer >90  >90 mL/min  PRO B NATRIURETIC PEPTIDE      Result Value Ref Range   Pro B Natriuretic peptide (BNP) 178.3 (*) 0 - 125 pg/mL  I-STAT TROPOININ, ED      Result Value Ref Range   Troponin i, poc 0.00  0.00 - 0.08 ng/mL   Comment 3            Dg Chest 2 View  10/10/2013   CLINICAL DATA:  Short of breath.  Left-sided chest pain.  EXAM: CHEST  2 VIEW  COMPARISON:  04/05/2012  FINDINGS: Cardiac silhouette is normal in size and configuration. Mediastinum is normal in  contour and caliber. No hilar masses or adenopathy.  Prominent bronchovascular markings. Lungs are hyperexpanded. There is coarse reticular left lung base opacity likely scarring or atelectasis. No lung consolidation is seen to suggest pneumonia. No pulmonary edema. No pleural effusion or pneumothorax.  The bony thorax is demineralized but grossly intact.  IMPRESSION: No acute cardiopulmonary disease.  Left lung base opacity is similar to the previous exam most likely atelectasis/ scarring.   Electronically Signed   By: Amie Portland M.D.   On: 10/10/2013 09:07    Imaging Review No results found.   EKG Interpretation   Date/Time:  Saturday October 10 2013 08:34:47 EDT Ventricular Rate:  85 PR Interval:  199 QRS Duration: 132 QT Interval:  406 QTC Calculation: 483 R Axis:   -48 Text Interpretation:  Sinus rhythm Multiple ventricular premature  complexes Left bundle branch block Baseline wander in lead(s) V4 Flipped T  waves laterally in I and aVL - new Confirmed by Gwendolyn Grant  MD, BLAIR ([id number])  on 10/10/2013 8:42:44 AM      MDM   Final diagnoses:  SOB (shortness of breath)  Chest pain    Patient with SOB and arm pain.  Pain free in ED.  HEART score is 5.  Concern for ACS. Anticipate admission.  Will check labs, give additional aspirin and nitro. Discussed the patient with Dr. Gwendolyn Grant, who agrees with the plan.  10:04 AM Discussed the patient with Dr. Ardyth Harps, who will admit the patient for chest pain rule out.    Roxy Horseman, PA-C 10/10/13 1005

## 2013-10-10 NOTE — ED Notes (Signed)
Pt from home c/o of shortness of breath and left arm pain that presented at 0630 this am in which it woke him from his sleep. Pt denies chest pain but had nausea at time of shortness of breath. He took an 81 mg ASA this a.m

## 2013-10-10 NOTE — Progress Notes (Signed)
ANTICOAGULATION CONSULT NOTE - Initial Consult  Pharmacy Consult for Heparin Indication: ACS/STEMI  Allergies  Allergen Reactions  . Percocet [Oxycodone-Acetaminophen] Other (See Comments)    Hallucintaions    Patient Measurements: Height: 5\' 7"  (170.2 cm) Weight: 209 lb 14.1 oz (95.2 kg) IBW/kg (Calculated) : 66.1 Heparin Dosing Weight: 86.3kg  Vital Signs: Temp: 97.9 F (36.6 C) (04/11 1054) Temp src: Oral (04/11 1054) BP: 158/85 mmHg (04/11 1054) Pulse Rate: 82 (04/11 1054)  Labs:  Recent Labs  10/10/13 0838  HGB 11.8*  HCT 36.5*  PLT 238  CREATININE 0.86    Estimated Creatinine Clearance: 96.6 ml/min (by C-G formula based on Cr of 0.86).   Medical History: Past Medical History  Diagnosis Date  . Emphysema   . Leaky heart valve   . Fatty liver   . TIA (transient ischemic attack)   . Hypertension   . Hyperlipidemia   . Neuropathy   . Diabetes mellitus 10/17/2011    newly dx today  . Slow urinary stream   . Rheumatic fever   . 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 Edema leg     right leg has leaky valve and right foot swells  . Excessive ear wax   . Hernia     near navel   Assessment: [age] yoM with PMHx HTN, HLD, DM, and CAD presents with SOB and pain that radiates to left arm.  EKG without acute ischemia.  Has received ASA 81mg  this AM.  Admit for r/o ACS and pharmacy consulted to start IV heparin.   Hep dosing wt = 86.3kg Baseline PT/INR, aPTT ordered No anticoagulants noted on medication history list CBC: Hgb 11.8, slightly low, plts ok SCr WNL, no issues noted  Goal of Therapy:  Heparin level 0.3-0.7 units/ml Monitor platelets by anticoagulation protocol: Yes   Plan:  Heparin 4000 unit bolus, then start infusion at 1100 units/hr.  F/u heparin level in 6 hours. F/u baseline labs and cards consult.   Haynes Hoehnolleen Arvada Seaborn, PharmD, BCPS 10/10/2013, 2:03 PM  Pager: [phone]

## 2013-10-10 NOTE — H&P (Signed)
[facility]          History and Physical    PCP:   [facility]   Chief Complaint:  Shortness of breath, chest pain  HPI: Patient is a pleasant 63 year old man who is accompanied today by his wife. He states that he woke up this morning at about 6:30 AM and noticed a sensation over his chest as if something heavy was sitting on him accompanied by shortness of breath. He also noted some pain on his left shoulder. 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 Because the pain did not resolve after an hour and a half his wife decided to bring him into the hospital for evaluation. He did take a baby aspirin at home prior to coming. By the time he arrives his chest pain has resolved. His first troponin is negative, however his EKG shows some inverted T waves in the lateral leads and PVCs which are changed from prior EKG in 2013. His past medical history is significant for diet-controlled diabetes, hypertension, hyperlipidemia. He also has a strong family history of premature coronary artery disease. He had a catheterization in 2012 by Dr. Burt Knack with minimal coronary artery disease. Has not had any cardiology followup since. We have been asked to admit him for further evaluation and management.  Allergies:   Allergies  Allergen Reactions  . Percocet [Oxycodone-Acetaminophen] Other (See Comments)    Hallucintaions      Past Medical History  Diagnosis Date  . Emphysema   . Leaky heart valve   . Fatty liver   . TIA (transient ischemic attack)   . Hypertension   . Hyperlipidemia   . Neuropathy   . Diabetes mellitus 10/17/2011    newly dx today  . Slow urinary stream   . Rheumatic fever   . 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 Edema leg     right leg has leaky valve and right foot swells  .  Excessive ear wax   . Hernia     near navel    Past Surgical History  Procedure Laterality Date  . Lithotripsy    . Circumcision      Prior to Admission medications   Medication Sig Start Date End Date Taking? Authorizing Provider  aspirin 81 MG tablet Take 81 mg by mouth daily.     Yes Historical Provider, MD  atorvastatin (LIPITOR) 20 MG tablet Take 20 mg by mouth every evening.    Yes Historical Provider, MD  furosemide (LASIX) 20 MG tablet Take 20 mg by mouth daily as needed for fluid.   Yes Historical Provider, MD  gabapentin (NEURONTIN) 300 MG capsule Take 300 mg by mouth 2 (two) times daily. Takes 2 in the morning and 1 at night   Yes Historical Provider, MD  metFORMIN (GLUCOPHAGE) 500 MG tablet Take 500 mg by mouth 2 (two) times daily with a meal.   Yes Historical Provider, MD  terazosin (HYTRIN) 5 MG capsule Take 5 mg by mouth daily.   Yes Historical Provider, MD    Social History:  reports that he has never smoked. He has never used smokeless tobacco. He reports that he does not drink alcohol or use illicit drugs.  Family History  Problem Relation Age  of Onset  . Emphysema Mother   . Coronary artery disease Brother   . Coronary artery disease Brother   . Coronary artery disease Sister     Review of Systems:  Constitutional: Denies fever, chills, diaphoresis, appetite change and fatigue.  HEENT: Denies photophobia, eye pain, redness, hearing loss, ear pain, congestion, sore throat, rhinorrhea, sneezing, mouth sores, trouble swallowing, neck pain, neck stiffness and tinnitus.   Respiratory: Denies SOB, DOE, cough,   and wheezing.   Cardiovascular: Denies  leg swelling.  Gastrointestinal: Denies nausea, vomiting, abdominal pain, diarrhea, constipation, blood in stool and abdominal distention.  Genitourinary: Denies dysuria, urgency, frequency, hematuria, flank pain and difficulty urinating.  Endocrine: Denies: hot or cold intolerance, sweats, changes in hair or nails,  polyuria, polydipsia. Musculoskeletal: Denies myalgias, back pain, joint swelling, arthralgias and gait problem.  Skin: Denies pallor, rash and wound.  Neurological: Denies dizziness, seizures, syncope, weakness, light-headedness, numbness and headaches.  Hematological: Denies adenopathy. Easy bruising, personal or family bleeding history  Psychiatric/Behavioral: Denies suicidal ideation, mood changes, confusion, nervousness, sleep disturbance and agitation   Physical Exam: Blood pressure 158/85, pulse 82, temperature 97.9 F (36.6 C), temperature source Oral, resp. rate 18, height $RemoveBe'5\' 7"'xMpnqiRai$  (1.702 m), weight 95.2 kg (209 lb 14.1 oz), SpO2 99.00%. General: Alert, awake, oriented x3, in no current distress. HEENT: Normocephalic, atraumatic, pupils equal and reactive to light, extraocular movements intact. Neck: Supple, no JVD, no lymphadenopathy, no bruits, no goiter. Cardiovascular: Regular rate and rhythm, no murmurs, rubs or gallops.  Lungs: Clear to auscultation bilaterally. Abdomen: Soft, nontender, nondistended, positive bowel sounds, no masses or organomegaly noted. Extremities: No clubbing, cyanosis. 2+ pitting edema bilaterally. Neurologic: Grossly intact and nonfocal.  Labs on Admission:  Results for orders placed during the hospital encounter of 10/10/13 (from the past 48 hour(s))  CBC WITH DIFFERENTIAL     Status: Abnormal   Collection Time    10/10/13  8:38 AM      Result Value Ref Range   WBC 6.7  4.0 - 10.5 K/uL   RBC 4.02 (*) 4.22 - 5.81 MIL/uL   Hemoglobin 11.8 (*) 13.0 - 17.0 g/dL   HCT 36.5 (*) 39.0 - 52.0 %   MCV 90.8  78.0 - 100.0 fL   MCH 29.4  26.0 - 34.0 pg   MCHC 32.3  30.0 - 36.0 g/dL   RDW 13.9  11.5 - 15.5 %   Platelets 238  150 - 400 K/uL   Neutrophils Relative % 69  43 - 77 %   Neutro Abs 4.6  1.7 - 7.7 K/uL   Lymphocytes Relative 21  12 - 46 %   Lymphs Abs 1.4  0.7 - 4.0 K/uL   Monocytes Relative 5  3 - 12 %   Monocytes Absolute 0.3  0.1 - 1.0 K/uL    Eosinophils Relative 5  0 - 5 %   Eosinophils Absolute 0.3  0.0 - 0.7 K/uL   Basophils Relative 1  0 - 1 %   Basophils Absolute 0.1  0.0 - 0.1 K/uL  BASIC METABOLIC PANEL     Status: Abnormal   Collection Time    10/10/13  8:38 AM      Result Value Ref Range   Sodium 140  137 - 147 mEq/L   Potassium 4.1  3.7 - 5.3 mEq/L   Chloride 101  96 - 112 mEq/L   CO2 26  19 - 32 mEq/L   Glucose, Bld 177 (*) 70 - 99 mg/dL  BUN 15  6 - 23 mg/dL   Creatinine, Ser 0.86  0.50 - 1.35 mg/dL   Calcium 8.9  8.4 - 10.5 mg/dL   GFR calc non Af Amer >90  >90 mL/min   GFR calc Af Amer >90  >90 mL/min   Comment: (NOTE)     The eGFR has been calculated using the [name] EPI equation.     This calculation has not been validated in all clinical situations.     eGFR's persistently <90 mL/min signify possible Chronic Kidney     Disease.  Randolm Idol, ED     Status: None   Collection Time    10/10/13  8:49 AM      Result Value Ref Range   Troponin i, poc 0.00  0.00 - 0.08 ng/mL   Comment 3            Comment: Due to the release kinetics of cTnI,     a negative result within the first hours     of the onset of symptoms does not rule out     myocardial infarction with certainty.     If myocardial infarction is still suspected,     repeat the test at appropriate intervals.  PRO B NATRIURETIC PEPTIDE     Status: Abnormal   Collection Time    10/10/13  8:49 AM      Result Value Ref Range   Pro B Natriuretic peptide (BNP) 178.3 (*) 0 - 125 pg/mL  TROPONIN I     Status: None   Collection Time    10/10/13  1:40 PM      Result Value Ref Range   Troponin I <0.30  <0.30 ng/mL   Comment:            Due to the release kinetics of cTnI,     a negative result within the first hours     of the onset of symptoms does not rule out     myocardial infarction with certainty.     If myocardial infarction is still suspected,     repeat the test at appropriate intervals.  APTT     Status: None   Collection Time     10/10/13  2:40 PM      Result Value Ref Range   aPTT 28  24 - 37 seconds  PROTIME-INR     Status: None   Collection Time    10/10/13  2:40 PM      Result Value Ref Range   Prothrombin Time 13.2  11.6 - 15.2 seconds   INR 1.02  0.00 - 1.49    Radiological Exams on Admission: Dg Chest 2 View  10/10/2013   CLINICAL DATA:  Short of breath.  Left-sided chest pain.  EXAM: CHEST  2 VIEW  COMPARISON:  04/05/2012  FINDINGS: Cardiac silhouette is normal in size and configuration. Mediastinum is normal in contour and caliber. No hilar masses or adenopathy.  Prominent bronchovascular markings. Lungs are hyperexpanded. There is coarse reticular left lung base opacity likely scarring or atelectasis. No lung consolidation is seen to suggest pneumonia. No pulmonary edema. No pleural effusion or pneumothorax.  The bony thorax is demineralized but grossly intact.  IMPRESSION: No acute cardiopulmonary disease.  Left lung base opacity is similar to the previous exam most likely atelectasis/ scarring.   Electronically Signed   By: Lajean Manes M.D.   On: 10/10/2013 09:07    Assessment/Plan Principal Problem:   Chest pain Active Problems:  SOB (shortness of breath)   Diabetes mellitus   HTN (hypertension)    Chest pain/shortness of breath -Concerning for acute coronary syndrome in a patient with multiple personal and family risk factors. -Start heparin drip. -Cycle cardiac enzymes and repeat EKG. -Cardiology consultation has been requested (discussed with Dr. Caryl Comes).  Diabetes mellitus -Sensitive sliding scale insulin. -Check hemoglobin A1c.  Hypertension -Continue home medications. -Currently well-controlled.  DVT prophylaxis -Currently on a heparin drip.  CODE STATUS -Full code as discussed with patient and wife at bedside.   Time Spent on Admission:  80 minutes  [facility] Pager: [phone] 10/10/2013, 3:57 PM

## 2013-10-10 NOTE — ED Provider Notes (Signed)
Medical screening examination/treatment/procedure(s) were conducted as a shared visit with non-physician practitioner(s) and myself.  I personally evaluated the patient during the encounter.   EKG Interpretation   Date/Time:  Saturday October 10 2013 08:34:47 EDT Ventricular Rate:  85 PR Interval:  199 QRS Duration: 132 QT Interval:  406 QTC Calculation: 483 R Axis:   -48 Text Interpretation:  Sinus rhythm Multiple ventricular premature  complexes Left bundle branch block Baseline wander in lead(s) V4 Flipped T  waves laterally in I and aVL - new Confirmed by Gwendolyn GrantWALDEN  MD, Aysa Larivee ([id number])  on 10/10/2013 8:42:44 AM      Patient here with SOB, concerning for anginal equivalent. No hx of CAD. No SOB at this time, given aspirin. Admitted for ACS r/o. EKG without acute ischemia.  Dagmar HaitWilliam Latisa Belay, MD 10/10/13 1059

## 2013-10-11 ENCOUNTER — Observation Stay (HOSPITAL_COMMUNITY): Payer: BC Managed Care – PPO

## 2013-10-11 ENCOUNTER — Other Ambulatory Visit: Payer: Self-pay

## 2013-10-11 ENCOUNTER — Ambulatory Visit (HOSPITAL_COMMUNITY)
Admit: 2013-10-11 | Discharge: 2013-10-11 | Disposition: A | Discharge status: Home / Self Care | Payer: BC Managed Care – PPO | Attending: Internal Medicine | Admitting: Internal Medicine

## 2013-10-11 ENCOUNTER — Observation Stay (HOSPITAL_COMMUNITY): Admit: 2013-10-11 | Payer: BC Managed Care – PPO

## 2013-10-11 ENCOUNTER — Observation Stay (HOSPITAL_COMMUNITY)
Admit: 2013-10-11 | Discharge: 2013-10-11 | Disposition: A | Discharge status: Home / Self Care | Payer: BC Managed Care – PPO | Attending: Internal Medicine | Admitting: Internal Medicine

## 2013-10-11 ENCOUNTER — Encounter (HOSPITAL_COMMUNITY): Payer: Self-pay | Admitting: Radiology

## 2013-10-11 DIAGNOSIS — I359 Nonrheumatic aortic valve disorder, unspecified: Secondary | ICD-10-CM

## 2013-10-11 LAB — TROPONIN I: Troponin I: <0.3 ng/mL (ref <0.30)

## 2013-10-11 LAB — CBC
HCT: 33.8 % — ABNORMAL LOW (ref 39.0–52.0)
HEMOGLOBIN: 10.8 g/dL — AB (ref 13.0–17.0)
MCH: 29.3 pg (ref 26.0–34.0)
MCHC: 32 g/dL (ref 30.0–36.0)
MCV: 91.6 fL (ref 78.0–100.0)
Platelets: 238 10*3/uL (ref 150–400)
RBC: 3.69 MIL/uL — AB (ref 4.22–5.81)
RDW: 13.9 % (ref 11.5–15.5)
WBC: 6.7 10*3/uL (ref 4.0–10.5)

## 2013-10-11 LAB — BASIC METABOLIC PANEL
BUN: 21 mg/dL (ref 6–23)
CO2: 27 meq/L (ref 19–32)
Calcium: 8.6 mg/dL (ref 8.4–10.5)
Chloride: 105 mEq/L (ref 96–112)
Creatinine, Ser: 1.1 mg/dL (ref 0.50–1.35)
GFR calc Af Amer: 81 mL/min — ABNORMAL LOW (ref >90)
GFR calc non Af Amer: 70 mL/min — ABNORMAL LOW (ref >90)
Glucose, Bld: 133 mg/dL — ABNORMAL HIGH (ref 70–99)
POTASSIUM: 4.5 meq/L (ref 3.7–5.3)
SODIUM: 140 meq/L (ref 137–147)

## 2013-10-11 LAB — GLUCOSE, CAPILLARY: Glucose-Capillary: 121 mg/dL — ABNORMAL HIGH (ref 70–99)

## 2013-10-11 MED ORDER — TECHNETIUM TC 99M SESTAMIBI - CARDIOLITE: 30.0000 | Freq: Once | INTRAVENOUS | Status: DC | PRN

## 2013-10-11 MED ORDER — PANTOPRAZOLE SODIUM 40 MG PO TBEC: 40.0000 mg | DELAYED_RELEASE_TABLET | Freq: Every day | ORAL | Status: AC

## 2013-10-11 MED ORDER — REGADENOSON 0.4 MG/5ML IV SOLN: 0.4000 mg | Freq: Once | INTRAVENOUS | Status: DC

## 2013-10-11 MED ORDER — TECHNETIUM TC 99M SESTAMIBI - CARDIOLITE
10.0000 | Freq: Once | INTRAVENOUS | Status: AC | PRN
  Administered 2013-10-11: 10:00:00 10 via INTRAVENOUS

## 2013-10-11 MED ORDER — TECHNETIUM TC 99M SESTAMIBI - CARDIOLITE
30.0000 | Freq: Once | INTRAVENOUS | Status: AC | PRN
  Administered 2013-10-11: 30 via INTRAVENOUS

## 2013-10-11 MED ORDER — REGADENOSON 0.4 MG/5ML IV SOLN
INTRAVENOUS | Status: AC
  Filled 2013-10-11: qty 5

## 2013-10-11 MED ORDER — IOHEXOL 350 MG/ML SOLN
100.0000 mL | Freq: Once | INTRAVENOUS | Status: AC | PRN
  Administered 2013-10-11: 100 mL via INTRAVENOUS

## 2013-10-11 NOTE — Progress Notes (Signed)
Echocardiogram 2D Echocardiogram has been performed.  Estelle GrumblesMelissa J Princesa Willig 10/11/2013, 2:43 PM

## 2013-10-11 NOTE — Progress Notes (Signed)
    Subjective:  Denies CP or dyspnea   Objective:  Filed Vitals:   10/10/13 1054 10/10/13 1512 10/10/13 2347 10/11/13 0500  BP: 158/85 156/79 141/75 160/86  Pulse: 82 83 77 65  Temp: 97.9 F (36.6 C) 97.7 F (36.5 C) 97.9 F (36.6 C) 98 F (36.7 C)  TempSrc: Oral  Oral Oral  Resp: 18  18 18   Height: 5\' 7"  (1.702 m)     Weight: 209 lb 14.1 oz (95.2 kg)     SpO2: 99% 98% 96% 98%    Intake/Output from previous day:  Intake/Output Summary (Last 24 hours) at 10/11/13 0919 Last data filed at 10/11/13 0500  Gross per 24 hour  Intake 828.32 ml  Output    500 ml  Net 328.32 ml    Physical Exam: Physical exam: Well-developed well-nourished in no acute distress.  Skin is warm and dry.  HEENT is normal.  Neck is supple.  Chest is clear to auscultation with normal expansion.  Cardiovascular exam is regular rate and rhythm.  Abdominal exam nontender or distended. No masses palpated. Extremities show 1+ edema. neuro grossly intact    Lab Results: Basic Metabolic Panel:  Recent Labs  40/98/1103/05/17 0838 10/11/13 0120  NA 140 140  K 4.1 4.5  CL 101 105  CO2 26 27  GLUCOSE 177* 133*  BUN 15 21  CREATININE 0.86 1.10  CALCIUM 8.9 8.6   CBC:  Recent Labs  10/10/13 0838 10/11/13 0120  WBC 6.7 6.7  NEUTROABS 4.6  --   HGB 11.8* 10.8*  HCT 36.5* 33.8*  MCV 90.8 91.6  PLT 238 238   Cardiac Enzymes:  Recent Labs  10/10/13 1340 10/10/13 1925 10/11/13 0120  TROPONINI <0.30 <0.30 <0.30     Assessment/Plan:  1 chest pain-symptoms somewhat atypical. See discussion by Dr. Gala RomneyBensimhon yesterday. He has ruled out. Plan nuclear study today. We also plan echocardiogram. 2 history of dilated descending aorta-would favor CTA to further assess. Will arrange. 3 diabetes mellitus-management per primary care. 4 hypertension-blood pressure mildly elevated. Following his procedures would add an ACE inhibitor as an outpatient. I will leave this to her primary care. 5  hyperlipidemia-continue statin.  Lewayne BuntingBrian S Crenshaw 10/11/2013, 9:19 AM

## 2013-10-11 NOTE — Progress Notes (Signed)
   Lexiscan Myoview  Lexiscan Injected. ECG: no significant changes. Tolerated ok.  Complained of nausea. Images pending. Signed,  Tereso NewcomerScott Mariadelcarmen Corella, PA-C   10/11/2013 11:06 AM

## 2013-10-11 NOTE — Progress Notes (Signed)
As outlined. [name] [name]  

## 2013-10-11 NOTE — Discharge Summary (Signed)
Physician Discharge Summary  [name] WUJ:[id number] DOB: 1951-02-06 DOA: 10/10/2013  [facility]  Admit date: 10/10/2013 Discharge date: 10/11/2013  Time spent: 45 minutes  Recommendations for Outpatient Follow-up:  -Will be discharged home today. -Advised to follow up with PCP in 2 weeks.   Discharge Diagnoses:  Principal Problem:   Chest pain Active Problems:   SOB (shortness of breath)   Diabetes mellitus   HTN (hypertension)   Discharge Condition: Stable and improved  Filed Weights   10/10/13 1054  Weight: 95.2 kg (209 lb 14.1 oz)    History of present illness:  Patient is a pleasant 63 year old man who is accompanied today by his wife. He states that he woke up this morning at about 6:30 AM and noticed a sensation over his chest as if something heavy was sitting on him accompanied by shortness of breath. He also noted some pain on his left shoulder. 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 Because the pain did not resolve after an hour and a half his wife decided to bring him into the hospital for evaluation. He did take a baby aspirin at home prior to coming. By the time he arrives his chest pain has resolved. His first troponin is negative, however his EKG shows some inverted T waves in the lateral leads and PVCs which are changed from prior EKG in 2013. His past medical history is significant for diet-controlled diabetes, hypertension, hyperlipidemia. He also has a strong family history of premature coronary artery disease. He had a catheterization in 2012 by Dr. Excell Seltzer with minimal coronary artery disease. Has not had any cardiology followup since. We were asked to admit him for further evaluation and management.   Hospital Course:   Chest Pain/SOB -Given his personal and family risk factors, cardiology was consulted. -Stress  myoview today: IMPRESSION:  1. No scintigraphic evidence of prior infarction or  pharmacologically induced ischemia.  2. Mildly dilated left ventricular cavity with mild global  hypokinesia. Ejection fraction - 51%. -Will Dc patient home today. -Wonder if GERD might be an issue given his belching: have advised a course of protonix.  Rest of chronic medical issues have been stable this hospitalization.  Procedures:  None   Consultations:  None  Discharge Instructions  Discharge Orders   Future Orders Complete By Expires   Discontinue IV  As directed    Increase activity slowly  As directed        Medication List         aspirin 81 MG tablet  Take 81 mg by mouth daily.     atorvastatin 20 MG tablet  Commonly known as:  LIPITOR  Take 20 mg by mouth every evening.     gabapentin 300 MG capsule  Commonly known as:  NEURONTIN  Take 300 mg by mouth 2 (two) times daily. Takes 2 in the morning and 1 at night     LASIX 20 MG tablet  Generic drug:  furosemide  Take 20 mg by mouth daily as needed for fluid.     metFORMIN 500 MG tablet  Commonly known as:  GLUCOPHAGE  Take 500 mg by mouth 2 (two) times daily with a meal.     pantoprazole 40 MG tablet  Commonly known as:  PROTONIX  Take 1 tablet (40 mg total) by mouth daily.     terazosin  5 MG capsule  Commonly known as:  HYTRIN  Take 5 mg by mouth daily.       Allergies  Allergen Reactions  . Percocet [Oxycodone-Acetaminophen] Other (See Comments)    Hallucintaions       Follow-up Information   Follow up with [facility]. Schedule an appointment as soon as possible for a visit in 2 weeks.       The results of significant diagnostics from this hospitalization (including imaging, microbiology, ancillary and laboratory) are listed below for reference.    Significant Diagnostic Studies: Dg Chest 2 View  10/10/2013   CLINICAL DATA:  Short of breath.  Left-sided chest pain.  EXAM: CHEST  2 VIEW   COMPARISON:  04/05/2012  FINDINGS: Cardiac silhouette is normal in size and configuration. Mediastinum is normal in contour and caliber. No hilar masses or adenopathy.  Prominent bronchovascular markings. Lungs are hyperexpanded. There is coarse reticular left lung base opacity likely scarring or atelectasis. No lung consolidation is seen to suggest pneumonia. No pulmonary edema. No pleural effusion or pneumothorax.  The bony thorax is demineralized but grossly intact.  IMPRESSION: No acute cardiopulmonary disease.  Left lung base opacity is similar to the previous exam most likely atelectasis/ scarring.   Electronically Signed   By: Amie Portlandavid  Ormond M.D.   On: 10/10/2013 09:07   Nm Myocar Multi W/spect W/wall Motion / Ef  10/11/2013   CLINICAL DATA:  Chest pain history of CAD, diabetes, hypertension, shortness of breath  EXAM: MYOCARDIAL IMAGING WITH SPECT (REST AND PHARMACOLOGIC-STRESS)  GATED LEFT VENTRICULAR WALL MOTION STUDY  LEFT VENTRICULAR EJECTION FRACTION  TECHNIQUE: Standard myocardial SPECT imaging was performed after resting intravenous injection of 10 mCi Tc-5980m sestamibi. Subsequently, intravenous infusion of Lexiscan was performed under the supervision of the Cardiology staff. At peak effect of the drug, 30 mCi Tc-4680m sestamibi was injected intravenously and standard myocardial SPECT imaging was performed. Quantitative gated imaging was also performed to evaluate left ventricular wall motion, and estimate left ventricular ejection fraction.  COMPARISON:  DG CHEST 2 VIEW dated 10/10/2013; DG CHEST 2 VIEW dated 04/05/2012  FINDINGS: Review of the rotational raw images demonstrates mild chest wall attenuation on both the provided rest and stress images. There is no significant GI activity or patient motion artifact. A minimal amount of radiotracer is extravasated about the right upper extremity IV site.  SPECT imaging demonstrates mild dilatation of the left ventricular cavity. There is attenuation  involving the inferior wall and septum of the left ventricle as well as the apical aspect of the lateral wall of the left ventricle, all of which improve on the provided stress images. There is no scintigraphic evidence of prior infarction or pharmacologically induced ischemia.  Quantitative gated analysis demonstrates mild global hypokinesia.  The resting left ventricular ejection fraction is 51% with end-diastolic volume of 174 ml and end-systolic volume of 84 ml.  IMPRESSION: 1. No scintigraphic evidence of prior infarction or pharmacologically induced ischemia. 2. Mildly dilated left ventricular cavity with mild global hypokinesia. Ejection fraction - 51%.   Electronically Signed   By: Simonne ComeJohn  Watts M.D.   On: 10/11/2013 14:15    Microbiology: No results found for this or any previous visit (from the past 240 hour(s)).   Labs: Basic Metabolic Panel:  Recent Labs Lab 10/10/13 0838 10/11/13 0120  NA 140 140  K 4.1 4.5  CL 101 105  CO2 26 27  GLUCOSE 177* 133*  BUN 15 21  CREATININE 0.86 1.10  CALCIUM  8.9 8.6   Liver Function Tests: No results found for this basename: AST, ALT, ALKPHOS, BILITOT, PROT, ALBUMIN,  in the last 168 hours No results found for this basename: LIPASE, AMYLASE,  in the last 168 hours No results found for this basename: AMMONIA,  in the last 168 hours CBC:  Recent Labs Lab 10/10/13 0838 10/11/13 0120  WBC 6.7 6.7  NEUTROABS 4.6  --   HGB 11.8* 10.8*  HCT 36.5* 33.8*  MCV 90.8 91.6  PLT 238 238   Cardiac Enzymes:  Recent Labs Lab 10/10/13 1340 10/10/13 1925 10/11/13 0120  TROPONINI <0.30 <0.30 <0.30   BNP: BNP (last 3 results)  Recent Labs  10/10/13 0849  PROBNP 178.3*   CBG:  Recent Labs Lab 10/10/13 1305 10/10/13 1642 10/10/13 2056 10/11/13 0734  GLUCAP 100* 86 157* 121*       Signed:  Henderson Cloud  [facility] Pager: [phone] 10/11/2013, 4:21 PM

## 2013-10-12 LAB — GLUCOSE, CAPILLARY
GLUCOSE-CAPILLARY: 133 mg/dL — AB (ref 70–99)
Glucose-Capillary: 147 mg/dL — ABNORMAL HIGH (ref 70–99)

## 2013-10-15 LAB — GLUCOSE, CAPILLARY: GLUCOSE-CAPILLARY: 110 mg/dL — AB (ref 70–99)

## 2015-05-04 ENCOUNTER — Emergency Department (HOSPITAL_COMMUNITY): Payer: 59

## 2015-05-04 ENCOUNTER — Encounter (HOSPITAL_COMMUNITY): Payer: Self-pay | Admitting: Emergency Medicine

## 2015-05-04 ENCOUNTER — Observation Stay (HOSPITAL_COMMUNITY)
Admission: EM | Admit: 2015-05-04 | Discharge: 2015-05-04 | Discharge status: Against Medical Advice | Payer: 59 | Attending: Internal Medicine | Admitting: Internal Medicine

## 2015-05-04 DIAGNOSIS — Z79899 Other long term (current) drug therapy: Secondary | ICD-10-CM | POA: Insufficient documentation

## 2015-05-04 DIAGNOSIS — E785 Hyperlipidemia, unspecified: Secondary | ICD-10-CM | POA: Diagnosis not present

## 2015-05-04 DIAGNOSIS — J439 Emphysema, unspecified: Secondary | ICD-10-CM | POA: Insufficient documentation

## 2015-05-04 DIAGNOSIS — M6289 Other specified disorders of muscle: Secondary | ICD-10-CM | POA: Diagnosis not present

## 2015-05-04 DIAGNOSIS — Z7984 Long term (current) use of oral hypoglycemic drugs: Secondary | ICD-10-CM | POA: Diagnosis not present

## 2015-05-04 DIAGNOSIS — R200 Anesthesia of skin: Secondary | ICD-10-CM | POA: Insufficient documentation

## 2015-05-04 DIAGNOSIS — R531 Weakness: Secondary | ICD-10-CM | POA: Diagnosis present

## 2015-05-04 DIAGNOSIS — R4781 Slurred speech: Secondary | ICD-10-CM | POA: Diagnosis not present

## 2015-05-04 DIAGNOSIS — G629 Polyneuropathy, unspecified: Secondary | ICD-10-CM

## 2015-05-04 DIAGNOSIS — R300 Dysuria: Secondary | ICD-10-CM | POA: Insufficient documentation

## 2015-05-04 DIAGNOSIS — G459 Transient cerebral ischemic attack, unspecified: Secondary | ICD-10-CM | POA: Diagnosis present

## 2015-05-04 DIAGNOSIS — H532 Diplopia: Secondary | ICD-10-CM | POA: Diagnosis present

## 2015-05-04 DIAGNOSIS — I10 Essential (primary) hypertension: Secondary | ICD-10-CM | POA: Diagnosis present

## 2015-05-04 DIAGNOSIS — E1340 Other specified diabetes mellitus with diabetic neuropathy, unspecified: Secondary | ICD-10-CM | POA: Diagnosis not present

## 2015-05-04 DIAGNOSIS — E1140 Type 2 diabetes mellitus with diabetic neuropathy, unspecified: Secondary | ICD-10-CM | POA: Insufficient documentation

## 2015-05-04 DIAGNOSIS — E119 Type 2 diabetes mellitus without complications: Secondary | ICD-10-CM

## 2015-05-04 DIAGNOSIS — K760 Fatty (change of) liver, not elsewhere classified: Secondary | ICD-10-CM | POA: Insufficient documentation

## 2015-05-04 DIAGNOSIS — Z7982 Long term (current) use of aspirin: Secondary | ICD-10-CM | POA: Insufficient documentation

## 2015-05-04 DIAGNOSIS — Z885 Allergy status to narcotic agent status: Secondary | ICD-10-CM | POA: Insufficient documentation

## 2015-05-04 DIAGNOSIS — I447 Left bundle-branch block, unspecified: Secondary | ICD-10-CM | POA: Insufficient documentation

## 2015-05-04 DIAGNOSIS — Z8673 Personal history of transient ischemic attack (TIA), and cerebral infarction without residual deficits: Secondary | ICD-10-CM | POA: Diagnosis not present

## 2015-05-04 DIAGNOSIS — J342 Deviated nasal septum: Secondary | ICD-10-CM | POA: Diagnosis not present

## 2015-05-04 DIAGNOSIS — Z91018 Allergy to other foods: Secondary | ICD-10-CM | POA: Diagnosis not present

## 2015-05-04 LAB — CBC WITH DIFFERENTIAL/PLATELET
BASOS PCT: 1 %
Basophils Absolute: 0.1 10*3/uL (ref 0.0–0.1)
EOS ABS: 0.2 10*3/uL (ref 0.0–0.7)
EOS PCT: 3 %
HCT: 36.2 % — ABNORMAL LOW (ref 39.0–52.0)
HEMOGLOBIN: 11.7 g/dL — AB (ref 13.0–17.0)
Lymphocytes Relative: 16 %
Lymphs Abs: 1.1 10*3/uL (ref 0.7–4.0)
MCH: 29.6 pg (ref 26.0–34.0)
MCHC: 32.3 g/dL (ref 30.0–36.0)
MCV: 91.6 fL (ref 78.0–100.0)
Monocytes Absolute: 0.3 10*3/uL (ref 0.1–1.0)
Monocytes Relative: 4 %
NEUTROS PCT: 76 %
Neutro Abs: 5.5 10*3/uL (ref 1.7–7.7)
PLATELETS: 245 10*3/uL (ref 150–400)
RBC: 3.95 MIL/uL — AB (ref 4.22–5.81)
RDW: 14.2 % (ref 11.5–15.5)
WBC: 7.2 10*3/uL (ref 4.0–10.5)

## 2015-05-04 LAB — URINALYSIS, ROUTINE W REFLEX MICROSCOPIC
Bilirubin Urine: NEGATIVE
Glucose, UA: NEGATIVE mg/dL
Hgb urine dipstick: NEGATIVE
Ketones, ur: NEGATIVE mg/dL
Leukocytes, UA: NEGATIVE
NITRITE: NEGATIVE
PROTEIN: NEGATIVE mg/dL
Specific Gravity, Urine: 1.007 (ref 1.005–1.030)
UROBILINOGEN UA: 1 mg/dL (ref 0.0–1.0)
pH: 6 (ref 5.0–8.0)

## 2015-05-04 LAB — COMPREHENSIVE METABOLIC PANEL
ALK PHOS: 73 U/L (ref 38–126)
ALT: 22 U/L (ref 17–63)
ANION GAP: 5 (ref 5–15)
AST: 21 U/L (ref 15–41)
Albumin: 3.6 g/dL (ref 3.5–5.0)
BUN: 18 mg/dL (ref 6–20)
CALCIUM: 8.5 mg/dL — AB (ref 8.9–10.3)
CO2: 27 mmol/L (ref 22–32)
CREATININE: 0.89 mg/dL (ref 0.61–1.24)
Chloride: 105 mmol/L (ref 101–111)
Glucose, Bld: 161 mg/dL — ABNORMAL HIGH (ref 65–99)
Potassium: 3.8 mmol/L (ref 3.5–5.1)
Sodium: 137 mmol/L (ref 135–145)
TOTAL PROTEIN: 7 g/dL (ref 6.5–8.1)
Total Bilirubin: 0.6 mg/dL (ref 0.3–1.2)

## 2015-05-04 LAB — PROTIME-INR
INR: 1.02 (ref 0.00–1.49)
Prothrombin Time: 13.6 seconds (ref 11.6–15.2)

## 2015-05-04 LAB — TROPONIN I

## 2015-05-04 LAB — CBG MONITORING, ED: Glucose-Capillary: 128 mg/dL — ABNORMAL HIGH (ref 65–99)

## 2015-05-04 MED ORDER — INSULIN ASPART 100 UNIT/ML ~~LOC~~ SOLN: 0.0000 [IU] | Freq: Three times a day (TID) | SUBCUTANEOUS | Status: DC

## 2015-05-04 NOTE — ED Notes (Signed)
Patient's wife states that the patient can not have diuretics, insulin, and hypertension medications or side effects of the same.

## 2015-05-04 NOTE — ED Provider Notes (Signed)
7:34 PM Pt seen by one on my partners, hospitalist and neurology. Initial plan was admission for TIA. Pt requesting to leave. Says he feels fine now and doesn't see the point in staying for further evaluation. Explained that a transient ischemic attack by nature is going to resolve and that doesn't change the recommendations. He has medical decision making capability. He understands benefits of admission, further work-up, possibly identifying reversible cause, risk reduction therapies, etc. He understands risks of discharge including possible CVA, permanent disability, death, etc. Also had same discussion with woman identified as his wife on the telephone and she also would like the patient discharged. Questions answered to their apparent satisfaction. Return precautions discussed. Will be leaving AMA. Advised that needs outpatient follow-up as soon as possible.   Harold RazorStephen Moraima Burd, MD 05/04/15 [phone]

## 2015-05-04 NOTE — ED Notes (Signed)
Patient requesting to leave AMA. Requested Dr Juleen ChinaKohut to speak to patient in reference to [facility]MA.

## 2015-05-04 NOTE — ED Notes (Signed)
[facility] notified of need for transportation to [facility].

## 2015-05-04 NOTE — Consult Note (Signed)
Admission H&P    Chief Complaint: Acute onset of left-sided weakness and numbness as well as slurred speech.  HPI: [name] is an 64 y.o. male history of hypertension, hyperlipidemia, diabetes mellitus, rheumatic heart disease and TIA, presenting with new onset weakness and numbness involving left extremities as well as slurred speech. He was last known well at 10:45 AM today. He's been taking aspirin daily. CT scan of the head showed no acute intracranial abnormality. 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 Patient's deficits resolved after arriving in the emergency room. NIH stroke score at the time of this evaluation was 0.  LSN: 10:45 AM on 05/04/2015 tPA Given: No: Deficits resolved mRankin:  Past Medical History  Diagnosis Date  . Emphysema   . Leaky heart valve   . Fatty liver   . TIA (transient ischemic attack)   . Hypertension   . Hyperlipidemia   . Neuropathy (HCC)   . Diabetes mellitus 10/17/2011    newly dx today  . Slow urinary stream   . Rheumatic fever   . 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 Edema leg     right leg has leaky valve and right foot swells  . Excessive ear wax   . Hernia     near navel    Past Surgical History  Procedure Laterality Date  . Lithotripsy    . Circumcision      Family History  Problem Relation Age of Onset  . Emphysema Mother   . Coronary artery disease Brother   . Coronary artery disease Brother   . Coronary artery disease Sister    Social History:  reports that he has never smoked. He has never used smokeless tobacco. He reports that he does not drink alcohol or use illicit drugs.  Allergies:  Allergies  Allergen Reactions  . Percocet [Oxycodone-Acetaminophen] Other (See Comments)    Hallucintaions    Medications: Patient's preadmission medications were reviewed by me.  ROS: History obtained from the patient  General  ROS: negative for - chills, fatigue, fever, night sweats, weight gain or weight loss Psychological ROS: negative for - behavioral disorder, hallucinations, memory difficulties, mood swings or suicidal ideation Ophthalmic ROS: negative for - blurry vision, double vision, eye pain or loss of vision ENT ROS: negative for - epistaxis, nasal discharge, oral lesions, sore throat, tinnitus or vertigo Allergy and Immunology ROS: negative for - hives or itchy/watery eyes Hematological and Lymphatic ROS: negative for - bleeding problems, bruising or swollen lymph nodes Endocrine ROS: negative for - galactorrhea, hair pattern changes, polydipsia/polyuria or temperature intolerance Respiratory ROS: negative for - cough, hemoptysis, shortness of breath or wheezing Cardiovascular ROS: negative for - chest pain, dyspnea on exertion, edema or irregular heartbeat Gastrointestinal ROS: negative for - abdominal pain, diarrhea, hematemesis, nausea/vomiting or stool incontinence Genito-Urinary ROS: negative for - dysuria, hematuria, incontinence or urinary frequency/urgency Musculoskeletal ROS: negative for - joint swelling or muscular weakness Neurological ROS: as noted in HPI Dermatological ROS: negative for rash and skin lesion changes  Physical Examination: Blood pressure 158/83, pulse 95, temperature 98.7 F (37.1 C), temperature source Oral, resp. rate 16, SpO2 98 %.  HEENT-  Normocephalic, no lesions, without obvious abnormality.  Normal external eye and conjunctiva.  Normal TM's bilaterally.  Normal auditory canals and external ears. Normal external nose, mucus membranes and septum.  Normal pharynx. Neck supple with no masses, nodes, nodules or  enlargement. Cardiovascular - normal rate and regular rhythm; 2/6 systolic murmur; no gallop Lungs - chest clear, no wheezing, rales, normal symmetric air entry Abdomen - soft, non-tender; bowel sounds normal; no masses,  no organomegaly Extremities - moderate  edema distal legs and feet, right greater than left  Neurologic Examination: Mental Status: Alert, oriented, thought content appropriate.  Speech fluent without evidence of aphasia. Able to follow commands without difficulty. Cranial Nerves: II-Visual fields were normal. III/IV/VI-Pupils were equal and reacted normally to light. Extraocular movements were full and conjugate.    V/VII-no facial numbness and no facial weakness. VIII-normal. X-normal speech and symmetrical palatal movement. XI: trapezius strength/neck flexion strength normal bilaterally XII-midline tongue extension with normal strength. Motor: 5/5 bilaterally with normal tone and bulk Sensory: Absent sense of vibration below the knees. Deep Tendon Reflexes: 1+ and symmetric. Plantars: Flexor bilaterally Cerebellar: Normal finger-to-nose testing. Carotid auscultation: Normal  Results for orders placed or performed during the hospital encounter of 05/04/15 (from the past 48 hour(s))  CBC with Differential     Status: Abnormal   Collection Time: 05/04/15  2:01 PM  Result Value Ref Range   WBC 7.2 4.0 - 10.5 K/uL   RBC 3.95 (L) 4.22 - 5.81 MIL/uL   Hemoglobin 11.7 (L) 13.0 - 17.0 g/dL   HCT 72.5 (L) 36.6 - 44.0 %   MCV 91.6 78.0 - 100.0 fL   MCH 29.6 26.0 - 34.0 pg   MCHC 32.3 30.0 - 36.0 g/dL   RDW 34.7 42.5 - 95.6 %   Platelets 245 150 - 400 K/uL   Neutrophils Relative % 76 %   Neutro Abs 5.5 1.7 - 7.7 K/uL   Lymphocytes Relative 16 %   Lymphs Abs 1.1 0.7 - 4.0 K/uL   Monocytes Relative 4 %   Monocytes Absolute 0.3 0.1 - 1.0 K/uL   Eosinophils Relative 3 %   Eosinophils Absolute 0.2 0.0 - 0.7 K/uL   Basophils Relative 1 %   Basophils Absolute 0.1 0.0 - 0.1 K/uL  Protime-INR     Status: None   Collection Time: 05/04/15  2:01 PM  Result Value Ref Range   Prothrombin Time 13.6 11.6 - 15.2 seconds   INR 1.02 0.00 - 1.49   Ct Head Wo Contrast  05/04/2015  CLINICAL DATA:  Right-sided diplopia for 1 day. Left  arm and leg numbness and heavy feeling. Dysphasia for 1 day EXAM: CT HEAD WITHOUT CONTRAST TECHNIQUE: Contiguous axial images were obtained from the base of the skull through the vertex without intravenous contrast. COMPARISON:  Head CT April 05, 2012 and February 26, 2008; brain MRI October 17, 2011 FINDINGS: The ventricles are normal in size and configuration. There is mild sulcal prominence, particularly in the parietal lobes bilaterally, stable. There is superior right cerebellar atrophy, stable. There is no intracranial mass, hemorrhage, extra-axial fluid collection, or midline shift. There is evidence of a prior small lacunar infarct in the head of the caudate nucleus on the left. Elsewhere gray-white compartments appear normal. There is no evident acute infarct. The bony calvarium appears intact. There is opacification of multiple mastoid air cells bilaterally. There is rightward deviation of the nasal septum. There are either polyps or retention cysts in the left and right maxillary antra, more on the left than on the right. There are small retention cysts or polyps in the right ethmoid sinus region. IMPRESSION: Areas of atrophy, stable. Small prior lacunar infarct in the head of the caudate nucleus on the left. No acute  appearing infarct. No hemorrhage or mass effect. Areas of paranasal sinus disease as described. Rightward deviation of nasal septum. Opacification of multiple mastoid air cells bilaterally noted without air-fluid levels. Electronically Signed   By: Bretta BangWilliam  Woodruff III M.D.   On: 05/04/2015 14:25    Assessment: 64 y.o. male with multiple risk factors for stroke presenting with probable current transient ischemic attack. However, a small subcortical right ischemic infarction cannot be ruled out at this point.  Stroke Risk Factors - diabetes mellitus, family history, hyperlipidemia and hypertension  Plan: 1. HgbA1c, fasting lipid panel 2. MRI, MRA  of the brain without contrast 3. PT  consult, OT consult, Speech consult 4. Echocardiogram 5. Carotid dopplers 6. Prophylactic therapy-Antiplatelet med: Aspirin  7. Risk factor modification 8. Telemetry monitoring  C.R. Roseanne RenoStewart, MD [facility] [phone]  05/04/2015, 2:35 PM

## 2015-05-04 NOTE — H&P (Signed)
[facility] History and Physical  [name] WUJ:[id number] DOB: 01/15/1951 DOA: 05/04/2015  Referring physician: Dr. Clarice Pole PCP: [facility]   Chief Complaint: Left-sided weakness/numbness/diplopia  HPI: [name] is a 64 y.o. male  With history of diabetes mellitus, hypertension, hyperlipidemia, questionable TIA in the past presenting to the ED with a 2 week history of intermittent ongoing diplopia. Patient states went to work this morning and around 10:45 AM started noting some weakness in his left upper extremity and left lower extremity as well as numbness in his left lower extremity. Patient stated that he could be more functional but felt it was somewhat heavy and difficult to ambulate. Patient also noted some difficulty with his speech and it was felt he might have had some slurred speech. Patient denied any facial asymmetry. Patient stated spoke to his wife on the phone and was recommended that he presented ED. Upon arrival in the ED patient's symptoms have improved significantly. Patient denied any fevers, no chills, no nausea, no vomiting, no cough, no abdominal pain, no chest pain, no change in chronic shortness of breath, no melena, no hematemesis, no hematochezia, no constipation, no diarrhea. Patient does endorse some dysuria. Patient denies any cough. Patient states takes an aspirin daily. CT of the head was done which showed a small prior lacunar infarct in the head of the caudate nucleus on the left no acute abnormalities. Comprehensive metabolic profile done was unremarkable. Troponin was negative. CBC had a hemoglobin of 11.7 otherwise was within normal limits. Urinalysis was nitrite negative leukocytes negative. EKG showed a left bundle branch block which was old. Patient was seen in consultation by neurology who recommended admission for TIA rule out/stroke workup. [facility] for further evaluation and  management.   Review of Systems: As per HPI otherwise within nl limits. Constitutional:  No weight loss, night sweats, Fevers, chills, fatigue.  HEENT:  No headaches, Difficulty swallowing,Tooth/dental problems,Sore throat,  No sneezing, itching, ear ache, nasal congestion, post nasal drip,  Cardio-vascular:  No chest pain, Orthopnea, PND, swelling in lower extremities, anasarca, dizziness, palpitations  GI:  No heartburn, indigestion, abdominal pain, nausea, vomiting, diarrhea, change in bowel habits, loss of appetite  Resp:  No shortness of breath with exertion or at rest. No excess mucus, no productive cough, No non-productive cough, No coughing up of blood.No change in color of mucus.No wheezing.No chest wall deformity  Skin:  no rash or lesions.  GU:  no dysuria, change in color of urine, no urgency or frequency. No flank pain.  Musculoskeletal:  No joint pain or swelling. No decreased range of motion. No back pain.  Psych:  No change in mood or affect. No depression or anxiety. No memory loss.   Past Medical History  Diagnosis Date  . Emphysema   . Leaky heart valve   . Fatty liver   . TIA (transient ischemic attack)   . Hypertension   . Hyperlipidemia   . Neuropathy (HCC)   . Diabetes mellitus 10/17/2011    newly dx today  . Slow urinary stream   . Rheumatic fever   . 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 Edema leg     right leg has leaky valve and right foot swells  . Excessive ear wax   . Hernia     near navel   Past Surgical History  Procedure Laterality Date  . Lithotripsy    .  Circumcision     Social History:  reports that he has never smoked. He has never used smokeless tobacco. He reports that he does not drink alcohol or use illicit drugs.  Allergies  Allergen Reactions  . Cashew Nut Oil Palpitations  . Percocet [Oxycodone-Acetaminophen] Other (See Comments)    Hallucintaions    Family History   Problem Relation Age of Onset  . Emphysema Mother   . Aneurysm Mother   . Coronary artery disease Brother   . Coronary artery disease Brother   . Coronary artery disease Sister   . Emphysema Father    mother deceased age 65 from a brain aneurysm. Father deceased age 286 from COPD.  Prior to Admission medications   Medication Sig Start Date End Date Taking? Authorizing Provider  aspirin EC 81 MG tablet Take 81 mg by mouth daily.   Yes Historical Provider, MD  atorvastatin (LIPITOR) 20 MG tablet Take 20 mg by mouth every evening.    Yes Historical Provider, MD  gabapentin (NEURONTIN) 300 MG capsule Take 300-600 mg by mouth 2 (two) times daily. Takes 2 in the morning and 1 at night   Yes Historical Provider, MD  Liniments (VICKS BABYRUB EX) Apply 1 application topically daily as needed (nasal congestion).   Yes Historical Provider, MD  metFORMIN (GLUCOPHAGE) 500 MG tablet Take 500 mg by mouth 2 (two) times daily with a meal.   Yes Historical Provider, MD  naproxen sodium (ANAPROX) 220 MG tablet Take 220 mg by mouth 2 (two) times daily as needed (pain).   Yes Historical Provider, MD  pantoprazole (PROTONIX) 40 MG tablet Take 1 tablet (40 mg total) by mouth daily. 10/11/13  Yes Henderson CloudEstela Y Hernandez Acosta, MD  Potassium Chloride ER 20 MEQ TBCR Take 20 mEq by mouth daily. 05/03/15   Historical Provider, MD  tamsulosin (FLOMAX) 0.4 MG CAPS capsule Take 0.4 mg by mouth daily. 05/03/15   Historical Provider, MD  terazosin (HYTRIN) 5 MG capsule Take 5 mg by mouth daily.    Historical Provider, MD   Physical Exam: Filed Vitals:   05/04/15 1430 05/04/15 1500 05/04/15 1530 05/04/15 1546  BP: 172/88 146/83 152/81   Pulse: 94 88    Temp:    98.3 F (36.8 C)  TempSrc:      Resp: 19 25 26    SpO2: 99% 98%      Wt Readings from Last 3 Encounters:  10/10/13 95.2 kg (209 lb 14.1 oz)  04/12/13 86.183 kg (190 lb)  04/05/12 85.276 kg (188 lb)    General:  Well-developed well-nourished laying on gurney no  acute cardio pulmonary distress. Speaking in full sentences.  Eyes: PERRLA, EOMI, normal lids, irises & conjunctiva ENT: grossly normal hearing, lips & tongue Neck: no LAD, masses or thyromegaly Cardiovascular: RRR with a 3/6 systolic ejection murmur. Right lower extremity with 1-2+ edema chronically greater than left.  Respiratory: CTA bilaterally, no w/r/r. Normal respiratory effort. Abdomen: soft, distended, positive bowel sounds, no rebound, no guarding. Umbilical hernia noted easily reducible nontender. Skin: no rash or induration seen on limited exam Musculoskeletal: grossly normal tone BUE/BLE Psychiatric: grossly normal mood and affect, speech fluent and appropriate Neurologic: Alert and oriented 3. Cranial nerves II through XII grossly intact. Visual fields are intact. Sensation is intact. Unable to elicit reflexes symmetrically and diffusely. Gait not tested secondary to safety.           Labs on Admission:  Basic Metabolic Panel:  Recent Labs Lab 05/04/15 1401  NA  137  K 3.8  CL 105  CO2 27  GLUCOSE 161*  BUN 18  CREATININE 0.89  CALCIUM 8.5*   Liver Function Tests:  Recent Labs Lab 05/04/15 1401  AST 21  ALT 22  ALKPHOS 73  BILITOT 0.6  PROT 7.0  ALBUMIN 3.6   No results for input(s): LIPASE, AMYLASE in the last 168 hours. No results for input(s): AMMONIA in the last 168 hours. CBC:  Recent Labs Lab 05/04/15 1401  WBC 7.2  NEUTROABS 5.5  HGB 11.7*  HCT 36.2*  MCV 91.6  PLT 245   Cardiac Enzymes:  Recent Labs Lab 05/04/15 1401  TROPONINI <0.03    BNP (last 3 results) No results for input(s): BNP in the last 8760 hours.  ProBNP (last 3 results) No results for input(s): PROBNP in the last 8760 hours.  CBG:  Recent Labs Lab 05/04/15 1658  GLUCAP 128*    Radiological Exams on Admission: Ct Head Wo Contrast  05/04/2015  CLINICAL DATA:  Right-sided diplopia for 1 day. Left arm and leg numbness and heavy feeling. Dysphasia for 1 day  EXAM: CT HEAD WITHOUT CONTRAST TECHNIQUE: Contiguous axial images were obtained from the base of the skull through the vertex without intravenous contrast. COMPARISON:  Head CT April 05, 2012 and February 26, 2008; brain MRI October 17, 2011 FINDINGS: The ventricles are normal in size and configuration. There is mild sulcal prominence, particularly in the parietal lobes bilaterally, stable. There is superior right cerebellar atrophy, stable. There is no intracranial mass, hemorrhage, extra-axial fluid collection, or midline shift. There is evidence of a prior small lacunar infarct in the head of the caudate nucleus on the left. Elsewhere gray-white compartments appear normal. There is no evident acute infarct. The bony calvarium appears intact. There is opacification of multiple mastoid air cells bilaterally. There is rightward deviation of the nasal septum. There are either polyps or retention cysts in the left and right maxillary antra, more on the left than on the right. There are small retention cysts or polyps in the right ethmoid sinus region. IMPRESSION: Areas of atrophy, stable. Small prior lacunar infarct in the head of the caudate nucleus on the left. No acute appearing infarct. No hemorrhage or mass effect. Areas of paranasal sinus disease as described. Rightward deviation of nasal septum. Opacification of multiple mastoid air cells bilaterally noted without air-fluid levels. Electronically Signed   By: Bretta Bang III M.D.   On: 05/04/2015 14:25    EKG: Independently reviewed. Normal sinus rhythm with left bundle branch block which is old  Assessment/Plan Principal Problem:   TIA (transient ischemic attack) : Rule out. Active Problems:   Left-sided weakness   Diabetes mellitus (HCC)   HTN (hypertension)   Neuropathy (HCC)   Hyperlipidemia  #1 rule out TIA/left-sided weakness Patient with history of diabetes, hypertension, hyperlipidemia, questionable history of prior TIA presenting to  the ED with left-sided weakness and numbness onset around 10:45 AM whilst working with associated diplopia ongoing for the past 2 weeks. Patient with clinical improvement in the ED and a such TPA not administered. CT head negative for any acute abnormalities. Patient states mother died from a brain aneurysm. Patient also states takes aspirin on a daily basis. Will admit the patient to neuro telemetry floor for stroke workup. Will check MRI/MRA of the head. Check a 2-D echo. Check carotid Dopplers. Check a hemoglobin A1c. Check a fasting lipid panel. Patient has passed a swallow evaluation a/will place on a carb modified diet.  PT/OT/ST. Place on aspirin for secondary stroke prevention. Patient has been seen in consultation by neurology and appreciate input and recommendations.  #2 diabetes mellitus Check a hemoglobin A1c. Hold oral hypoglycemic agents. Place on sliding scale insulin.  #3 hypertension Currently stable. Continue home regimen of Flomax and Hytrin. Follow.  #4 hyperlipidemia Check a fasting lipid panel. Continue home dose Lipitor.  #5 prophylaxis PPI for GI prophylaxis. Lovenox for DVT prophylaxis.   Code Status: Full DVT Prophylaxis: Lovenox. Family Communication: Updated patient. No family at bedside. Disposition Plan: Admit to [facility] neuro telemetry.  Time spent: 65 minutes  Jodey Burbano M.D. [facility] Pager [phone]

## 2015-05-04 NOTE — ED Notes (Signed)
Attempted to call report. Receiving nurse was off of the floor and unable to take report. Receiving nurse to call back.

## 2015-05-04 NOTE — ED Notes (Signed)
No changes in neuro assessment completed earlier, but wife and patient both state that the patient's speech is different.

## 2015-05-04 NOTE — ED Notes (Signed)
Neurologist at bedside. 

## 2015-05-04 NOTE — ED Notes (Signed)
Patient's wife came to [facility] and stated that the patient's speech was different. Dr. Donnald GarrePfeiffer notified.

## 2015-05-04 NOTE — ED Notes (Signed)
Pt states that yesterday had double vision in right eye and saw his PCP, which scheduled an apt with Dr Azell DerSahpiro.  Pt states that today started having numbness in left leg and left arm feels heavier than right arm.

## 2015-05-04 NOTE — ED Provider Notes (Signed)
CSN: [id number]     Arrival date & time 05/04/15  1301 History   First MD Initiated Contact with Patient 05/04/15 1326     Chief Complaint  Patient presents with  . Diplopia  . Numbness     (Consider location/radiation/quality/duration/timing/severity/associated sxs/prior Treatment) HPI Patient has been having double vision that has been coming and going for about 2 weeks. In addition to the double vision he reports his vision has been blurred and that has been constant. He has seen his family doctor and he had an appointment to see ophthalmology however that is several weeks away. Patient reports today he was at work, and just before lunch (the time he ultimately decided on was 10:45), he noted a feeling of increased difficulty using his left arm and leg. He reports he still could function but it seemed just more difficult and somewhat heavy. He also feels that he was having some increased difficulty generating speech although again he was able to do so. He reports he was speaking to a coworker who could not discern a speech problem. The patient's wife however notes that his speech has changed and just seems more slow and thick. The patient denies he's been ill recently he has not had fevers, chills, respiratory symptoms or GI symptoms. He states approximately 8 years ago he had an episode of lower extremity weakness that resolved. Ultimately he was given a diagnosis of TIA. Past Medical History  Diagnosis Date  . Emphysema   . Leaky heart valve   . Fatty liver   . TIA (transient ischemic attack)   . Hypertension   . Hyperlipidemia   . Neuropathy (HCC)   . Diabetes mellitus 10/17/2011    newly dx today  . Slow urinary stream   . Rheumatic fever   . 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 Edema leg     right leg has leaky valve and right foot swells  . Excessive ear wax   . Hernia     near navel   Past Surgical History  Procedure Laterality  Date  . Lithotripsy    . Circumcision     Family History  Problem Relation Age of Onset  . Emphysema Mother   . Coronary artery disease Brother   . Coronary artery disease Brother   . Coronary artery disease Sister    Social History  Substance Use Topics  . Smoking status: Never Smoker   . Smokeless tobacco: Never Used  . Alcohol Use: No    Review of Systems 10 Systems reviewed and are negative for acute change except as noted in the HPI.    Allergies  Cashew nut oil and Percocet  Home Medications   Prior to Admission medications   Medication Sig Start Date End Date Taking? Authorizing Provider  aspirin EC 81 MG tablet Take 81 mg by mouth daily.   Yes Historical Provider, MD  atorvastatin (LIPITOR) 20 MG tablet Take 20 mg by mouth every evening.    Yes Historical Provider, MD  gabapentin (NEURONTIN) 300 MG capsule Take 300-600 mg by mouth 2 (two) times daily. Takes 2 in the morning and 1 at night   Yes Historical Provider, MD  Liniments (VICKS BABYRUB EX) Apply 1 application topically daily as needed (nasal congestion).   Yes Historical Provider, MD  metFORMIN (GLUCOPHAGE) 500 MG tablet Take 500 mg by mouth 2 (two) times daily with a meal.   Yes Historical Provider,  MD  naproxen sodium (ANAPROX) 220 MG tablet Take 220 mg by mouth 2 (two) times daily as needed (pain).   Yes Historical Provider, MD  pantoprazole (PROTONIX) 40 MG tablet Take 1 tablet (40 mg total) by mouth daily. 10/11/13  Yes Henderson Cloud, MD  Potassium Chloride ER 20 MEQ TBCR Take 20 mEq by mouth daily. 05/03/15   Historical Provider, MD  tamsulosin (FLOMAX) 0.4 MG CAPS capsule Take 0.4 mg by mouth daily. 05/03/15   Historical Provider, MD  terazosin (HYTRIN) 5 MG capsule Take 5 mg by mouth daily.    Historical Provider, MD   BP 158/83 mmHg  Pulse 95  Temp(Src) 98.7 F (37.1 C) (Oral)  Resp 16  SpO2 98% Physical Exam  Constitutional: He is oriented to person, place, and time.  Patient is  nontoxic and alert. He is moderately obese. No respiratory distress.  HENT:  Head: Normocephalic and atraumatic.  Right Ear: External ear normal.  Left Ear: External ear normal.  Nose: Nose normal.  Mouth/Throat: Oropharynx is clear and moist.  Eyes: EOM are normal. Pupils are equal, round, and reactive to light.  Neck: Neck supple.  Cardiovascular: Normal rate, regular rhythm and intact distal pulses.   2/6 systolic ejection murmur  Pulmonary/Chest: Effort normal and breath sounds normal. No respiratory distress.  Abdominal: Soft. Bowel sounds are normal. He exhibits no distension. There is no tenderness.  Musculoskeletal: Normal range of motion. He exhibits edema. He exhibits no tenderness.  2+ bilateral pitting edema.  Neurological: He is alert and oriented to person, place, and time. He has normal strength. No cranial nerve deficit. He exhibits normal muscle tone. Coordination normal. GCS eye subscore is 4. GCS verbal subscore is 5. GCS motor subscore is 6.  Cranial nerves II through XII are intact. Tongue protrusion is midline. Patient does not endorse difference to light touch on the left to right of face. Right and left upper extremity motor strength is symmetric with grip strength and push pull. She does not endorse difference to light touch from left to right. Patient can elevate each lower extremity independently off of the bed. He can resist some downward motion on each lower extremity independently as well. Plantar extension is symmetric on both feet. He does not endorse sensory differential to light touch to either side. Patient does not seem to have either receptive or expressive aphasia.  Skin: Skin is warm, dry and intact.  Psychiatric: He has a normal mood and affect.    ED Course  Procedures (including critical care time) Labs Review Labs Reviewed  COMPREHENSIVE METABOLIC PANEL - Abnormal; Notable for the following:    Glucose, Bld 161 (*)    Calcium 8.5 (*)    All other  components within normal limits  CBC WITH DIFFERENTIAL/PLATELET - Abnormal; Notable for the following:    RBC 3.95 (*)    Hemoglobin 11.7 (*)    HCT 36.2 (*)    All other components within normal limits  TROPONIN I  PROTIME-INR  URINALYSIS, ROUTINE W REFLEX MICROSCOPIC (NOT AT [facility])    Imaging Review Ct Head Wo Contrast  05/04/2015  CLINICAL DATA:  Right-sided diplopia for 1 day. Left arm and leg numbness and heavy feeling. Dysphasia for 1 day EXAM: CT HEAD WITHOUT CONTRAST TECHNIQUE: Contiguous axial images were obtained from the base of the skull through the vertex without intravenous contrast. COMPARISON:  Head CT April 05, 2012 and February 26, 2008; brain MRI October 17, 2011 FINDINGS: The ventricles are  normal in size and configuration. There is mild sulcal prominence, particularly in the parietal lobes bilaterally, stable. There is superior right cerebellar atrophy, stable. There is no intracranial mass, hemorrhage, extra-axial fluid collection, or midline shift. There is evidence of a prior small lacunar infarct in the head of the caudate nucleus on the left. Elsewhere gray-white compartments appear normal. There is no evident acute infarct. The bony calvarium appears intact. There is opacification of multiple mastoid air cells bilaterally. There is rightward deviation of the nasal septum. There are either polyps or retention cysts in the left and right maxillary antra, more on the left than on the right. There are small retention cysts or polyps in the right ethmoid sinus region. IMPRESSION: Areas of atrophy, stable. Small prior lacunar infarct in the head of the caudate nucleus on the left. No acute appearing infarct. No hemorrhage or mass effect. Areas of paranasal sinus disease as described. Rightward deviation of nasal septum. Opacification of multiple mastoid air cells bilaterally noted without air-fluid levels. Electronically Signed   By: Bretta BangWilliam  Woodruff III M.D.   On: 05/04/2015 14:25    I have personally reviewed and evaluated these images and lab results as part of my medical decision-making.   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 no  sig change from old. Confirmed by Donnald GarrePfeiffer, MD, Lebron ConnersMarcy [phone]([id number]) on 05/04/2015  3:36:15 PM     Consult (14:02): Patient's case was reviewed with Dr. Lu DuffelStuart, he will evaluate the patient in the emergency department. After consultation, Dr. Lu DuffelStuart requested patient be admitted at [facility]. MDM   Final diagnoses:  Left-sided weakness  Diplopia   Patient presents with symptoms of intermittent diplopia for 2 weeks. Today he had acute onset of left-sided weakness. During his examination the patient had intact motor function without significant differential to light sensory touch. Symptoms however are very suggestive of CVA or TIA. [facility] be [facility] as per consultation with Dr. Lu DuffelStuart for further diagnostic evaluation and stroke risk management.    Arby BarretteMarcy Catalia Massett, MD 05/04/15 [phone]

## 2015-05-05 LAB — URINE CULTURE

## 2016-10-27 ENCOUNTER — Inpatient Hospital Stay (HOSPITAL_COMMUNITY)
Admission: EM | Admit: 2016-10-27 | Discharge: 2016-11-02 | DRG: 871 | Disposition: A | Discharge status: Home / Self Care | Payer: Medicare Other | Attending: Pulmonary Disease | Admitting: Pulmonary Disease

## 2016-10-27 ENCOUNTER — Emergency Department (HOSPITAL_COMMUNITY): Payer: Medicare Other

## 2016-10-27 ENCOUNTER — Encounter (HOSPITAL_COMMUNITY): Payer: Self-pay | Admitting: *Deleted

## 2016-10-27 DIAGNOSIS — I447 Left bundle-branch block, unspecified: Secondary | ICD-10-CM | POA: Diagnosis present

## 2016-10-27 DIAGNOSIS — E1140 Type 2 diabetes mellitus with diabetic neuropathy, unspecified: Secondary | ICD-10-CM | POA: Diagnosis present

## 2016-10-27 DIAGNOSIS — A4151 Sepsis due to Escherichia coli [E. coli]: Secondary | ICD-10-CM | POA: Diagnosis present

## 2016-10-27 DIAGNOSIS — E669 Obesity, unspecified: Secondary | ICD-10-CM | POA: Diagnosis present

## 2016-10-27 DIAGNOSIS — R579 Shock, unspecified: Secondary | ICD-10-CM | POA: Diagnosis present

## 2016-10-27 DIAGNOSIS — A419 Sepsis, unspecified organism: Secondary | ICD-10-CM

## 2016-10-27 DIAGNOSIS — D72829 Elevated white blood cell count, unspecified: Secondary | ICD-10-CM

## 2016-10-27 DIAGNOSIS — S27309A Unspecified injury of lung, unspecified, initial encounter: Secondary | ICD-10-CM | POA: Diagnosis present

## 2016-10-27 DIAGNOSIS — J9601 Acute respiratory failure with hypoxia: Secondary | ICD-10-CM | POA: Diagnosis present

## 2016-10-27 DIAGNOSIS — I509 Heart failure, unspecified: Secondary | ICD-10-CM | POA: Diagnosis not present

## 2016-10-27 DIAGNOSIS — I248 Other forms of acute ischemic heart disease: Secondary | ICD-10-CM | POA: Diagnosis present

## 2016-10-27 DIAGNOSIS — E119 Type 2 diabetes mellitus without complications: Secondary | ICD-10-CM

## 2016-10-27 DIAGNOSIS — I5033 Acute on chronic diastolic (congestive) heart failure: Secondary | ICD-10-CM | POA: Diagnosis present

## 2016-10-27 DIAGNOSIS — R7989 Other specified abnormal findings of blood chemistry: Secondary | ICD-10-CM

## 2016-10-27 DIAGNOSIS — N400 Enlarged prostate without lower urinary tract symptoms: Secondary | ICD-10-CM | POA: Diagnosis present

## 2016-10-27 DIAGNOSIS — R21 Rash and other nonspecific skin eruption: Secondary | ICD-10-CM | POA: Diagnosis present

## 2016-10-27 DIAGNOSIS — J189 Pneumonia, unspecified organism: Secondary | ICD-10-CM

## 2016-10-27 DIAGNOSIS — Z825 Family history of asthma and other chronic lower respiratory diseases: Secondary | ICD-10-CM

## 2016-10-27 DIAGNOSIS — Z0189 Encounter for other specified special examinations: Secondary | ICD-10-CM

## 2016-10-27 DIAGNOSIS — J810 Acute pulmonary edema: Secondary | ICD-10-CM | POA: Diagnosis not present

## 2016-10-27 DIAGNOSIS — I2510 Atherosclerotic heart disease of native coronary artery without angina pectoris: Secondary | ICD-10-CM | POA: Diagnosis present

## 2016-10-27 DIAGNOSIS — Z6827 Body mass index (BMI) 27.0-27.9, adult: Secondary | ICD-10-CM

## 2016-10-27 DIAGNOSIS — I110 Hypertensive heart disease with heart failure: Secondary | ICD-10-CM | POA: Diagnosis present

## 2016-10-27 DIAGNOSIS — E1165 Type 2 diabetes mellitus with hyperglycemia: Secondary | ICD-10-CM | POA: Diagnosis present

## 2016-10-27 DIAGNOSIS — I5030 Unspecified diastolic (congestive) heart failure: Secondary | ICD-10-CM

## 2016-10-27 DIAGNOSIS — R000 Tachycardia, unspecified: Secondary | ICD-10-CM

## 2016-10-27 DIAGNOSIS — Z8673 Personal history of transient ischemic attack (TIA), and cerebral infarction without residual deficits: Secondary | ICD-10-CM | POA: Diagnosis not present

## 2016-10-27 DIAGNOSIS — E785 Hyperlipidemia, unspecified: Secondary | ICD-10-CM | POA: Diagnosis present

## 2016-10-27 DIAGNOSIS — J9600 Acute respiratory failure, unspecified whether with hypoxia or hypercapnia: Secondary | ICD-10-CM | POA: Diagnosis present

## 2016-10-27 DIAGNOSIS — J439 Emphysema, unspecified: Secondary | ICD-10-CM | POA: Diagnosis present

## 2016-10-27 DIAGNOSIS — E872 Acidosis, unspecified: Secondary | ICD-10-CM

## 2016-10-27 DIAGNOSIS — Z4659 Encounter for fitting and adjustment of other gastrointestinal appliance and device: Secondary | ICD-10-CM

## 2016-10-27 DIAGNOSIS — B9620 Unspecified Escherichia coli [E. coli] as the cause of diseases classified elsewhere: Secondary | ICD-10-CM

## 2016-10-27 DIAGNOSIS — J9602 Acute respiratory failure with hypercapnia: Secondary | ICD-10-CM | POA: Diagnosis present

## 2016-10-27 DIAGNOSIS — N179 Acute kidney failure, unspecified: Secondary | ICD-10-CM | POA: Diagnosis present

## 2016-10-27 DIAGNOSIS — F419 Anxiety disorder, unspecified: Secondary | ICD-10-CM | POA: Diagnosis present

## 2016-10-27 DIAGNOSIS — K760 Fatty (change of) liver, not elsewhere classified: Secondary | ICD-10-CM

## 2016-10-27 DIAGNOSIS — J9690 Respiratory failure, unspecified, unspecified whether with hypoxia or hypercapnia: Secondary | ICD-10-CM

## 2016-10-27 DIAGNOSIS — R109 Unspecified abdominal pain: Secondary | ICD-10-CM

## 2016-10-27 DIAGNOSIS — R7881 Bacteremia: Secondary | ICD-10-CM

## 2016-10-27 DIAGNOSIS — R778 Other specified abnormalities of plasma proteins: Secondary | ICD-10-CM

## 2016-10-27 DIAGNOSIS — Z8249 Family history of ischemic heart disease and other diseases of the circulatory system: Secondary | ICD-10-CM

## 2016-10-27 LAB — CBC WITH DIFFERENTIAL/PLATELET
BASOS ABS: 0.1 10*3/uL (ref 0.0–0.1)
Basophils Relative: 1 %
EOS ABS: 0.2 10*3/uL (ref 0.0–0.7)
EOS PCT: 1 %
HCT: 40.5 % (ref 39.0–52.0)
Hemoglobin: 12.9 g/dL — ABNORMAL LOW (ref 13.0–17.0)
LYMPHS PCT: 22 %
Lymphs Abs: 3.5 10*3/uL (ref 0.7–4.0)
MCH: 28.4 pg (ref 26.0–34.0)
MCHC: 31.9 g/dL (ref 30.0–36.0)
MCV: 89 fL (ref 78.0–100.0)
Monocytes Absolute: 0.7 10*3/uL (ref 0.1–1.0)
Monocytes Relative: 4 %
NEUTROS PCT: 72 %
Neutro Abs: 11.8 10*3/uL — ABNORMAL HIGH (ref 1.7–7.7)
PLATELETS: 324 10*3/uL (ref 150–400)
RBC: 4.55 MIL/uL (ref 4.22–5.81)
RDW: 14.5 % (ref 11.5–15.5)
WBC: 16.4 10*3/uL — AB (ref 4.0–10.5)

## 2016-10-27 LAB — COMPREHENSIVE METABOLIC PANEL
ALT: 34 U/L (ref 17–63)
AST: 30 U/L (ref 15–41)
Albumin: 3.6 g/dL (ref 3.5–5.0)
Alkaline Phosphatase: 160 U/L — ABNORMAL HIGH (ref 38–126)
Anion gap: 12 (ref 5–15)
BUN: 13 mg/dL (ref 6–20)
CHLORIDE: 103 mmol/L (ref 101–111)
CO2: 21 mmol/L — AB (ref 22–32)
CREATININE: 1.16 mg/dL (ref 0.61–1.24)
Calcium: 8.3 mg/dL — ABNORMAL LOW (ref 8.9–10.3)
GFR calc Af Amer: >60 mL/min (ref >60)
GFR calc non Af Amer: >60 mL/min (ref >60)
GLUCOSE: 252 mg/dL — AB (ref 65–99)
Potassium: 4.4 mmol/L (ref 3.5–5.1)
SODIUM: 136 mmol/L (ref 135–145)
Total Bilirubin: 0.5 mg/dL (ref 0.3–1.2)
Total Protein: 7.9 g/dL (ref 6.5–8.1)

## 2016-10-27 LAB — I-STAT CG4 LACTIC ACID, ED
Lactic Acid, Venous: 2.01 mmol/L (ref 0.5–1.9)
Lactic Acid, Venous: 4.02 mmol/L (ref 0.5–1.9)

## 2016-10-27 LAB — URINALYSIS, ROUTINE W REFLEX MICROSCOPIC
BILIRUBIN URINE: NEGATIVE
Bacteria, UA: NONE SEEN
Glucose, UA: 50 mg/dL — AB
HGB URINE DIPSTICK: NEGATIVE
Ketones, ur: NEGATIVE mg/dL
Leukocytes, UA: NEGATIVE
NITRITE: NEGATIVE
PH: 5 (ref 5.0–8.0)
Protein, ur: 30 mg/dL — AB
SPECIFIC GRAVITY, URINE: 1.016 (ref 1.005–1.030)

## 2016-10-27 LAB — I-STAT CHEM 8, ED
BUN: 17 mg/dL (ref 6–20)
Calcium, Ion: 1.05 mmol/L — ABNORMAL LOW (ref 1.15–1.40)
Chloride: 106 mmol/L (ref 101–111)
Creatinine, Ser: 1 mg/dL (ref 0.61–1.24)
Glucose, Bld: 255 mg/dL — ABNORMAL HIGH (ref 65–99)
HCT: 41 % (ref 39.0–52.0)
HEMOGLOBIN: 13.9 g/dL (ref 13.0–17.0)
POTASSIUM: 4.3 mmol/L (ref 3.5–5.1)
SODIUM: 140 mmol/L (ref 135–145)
TCO2: 24 mmol/L (ref 0–100)

## 2016-10-27 LAB — I-STAT ARTERIAL BLOOD GAS, ED
Acid-base deficit: 4 mmol/L — ABNORMAL HIGH (ref 0.0–2.0)
BICARBONATE: 23.4 mmol/L (ref 20.0–28.0)
O2 Saturation: 86 %
PO2 ART: 60 mmHg — AB (ref 83.0–108.0)
TCO2: 25 mmol/L (ref 0–100)
pCO2 arterial: 52.5 mmHg — ABNORMAL HIGH (ref 32.0–48.0)
pH, Arterial: 7.258 — ABNORMAL LOW (ref 7.350–7.450)

## 2016-10-27 LAB — I-STAT TROPONIN, ED: Troponin i, poc: 0 ng/mL (ref 0.00–0.08)

## 2016-10-27 LAB — CBG MONITORING, ED: Glucose-Capillary: 237 mg/dL — ABNORMAL HIGH (ref 65–99)

## 2016-10-27 LAB — BRAIN NATRIURETIC PEPTIDE: B Natriuretic Peptide: 495.2 pg/mL — ABNORMAL HIGH (ref 0.0–100.0)

## 2016-10-27 MED ORDER — DEXTROSE 5 % IV SOLN
1.0000 g | INTRAVENOUS | Status: DC
  Filled 2016-10-27: qty 10

## 2016-10-27 MED ORDER — DEXTROSE 5 % IV SOLN
1.0000 g | Freq: Once | INTRAVENOUS | Status: AC
  Administered 2016-10-27: 1 g via INTRAVENOUS
  Filled 2016-10-27: qty 10

## 2016-10-27 MED ORDER — MIDAZOLAM HCL 2 MG/2ML IJ SOLN
1.0000 mg | INTRAMUSCULAR | Status: AC | PRN
  Administered 2016-10-27: 1 mg via INTRAVENOUS
  Administered 2016-10-29: 23:00:00 via INTRAVENOUS
  Administered 2016-10-30: 1 mg via INTRAVENOUS
  Filled 2016-10-27 (×5): qty 2

## 2016-10-27 MED ORDER — DEXTROSE 5 % IV SOLN: 500.0000 mg | INTRAVENOUS | Status: DC

## 2016-10-27 MED ORDER — DEXTROSE 5 % IV SOLN
500.0000 mg | Freq: Once | INTRAVENOUS | Status: AC
  Administered 2016-10-27: 500 mg via INTRAVENOUS
  Filled 2016-10-27: qty 500

## 2016-10-27 MED ORDER — FENTANYL CITRATE (PF) 100 MCG/2ML IJ SOLN
50.0000 ug | INTRAMUSCULAR | Status: DC | PRN
  Administered 2016-10-27: 50 ug via INTRAVENOUS
  Filled 2016-10-27: qty 2

## 2016-10-27 MED ORDER — FUROSEMIDE 10 MG/ML IJ SOLN: 40.0000 mg | Freq: Once | INTRAMUSCULAR | Status: DC

## 2016-10-27 MED ORDER — FUROSEMIDE 10 MG/ML IJ SOLN
40.0000 mg | Freq: Once | INTRAMUSCULAR | Status: AC
  Administered 2016-10-27: 40 mg via INTRAVENOUS
  Filled 2016-10-27: qty 4

## 2016-10-27 MED ORDER — ROCURONIUM BROMIDE 50 MG/5ML IV SOLN
INTRAVENOUS | Status: DC | PRN
  Administered 2016-10-27: 100 mg via INTRAVENOUS

## 2016-10-27 MED ORDER — PROPOFOL 1000 MG/100ML IV EMUL
INTRAVENOUS | Status: AC
  Filled 2016-10-27: qty 100

## 2016-10-27 MED ORDER — SODIUM CHLORIDE 0.9 % IV BOLUS (SEPSIS)
1000.0000 mL | Freq: Once | INTRAVENOUS | Status: AC
  Administered 2016-10-27: 1000 mL via INTRAVENOUS

## 2016-10-27 MED ORDER — NITROGLYCERIN IN D5W 200-5 MCG/ML-% IV SOLN: 0.0000 ug/min | INTRAVENOUS | Status: DC

## 2016-10-27 MED ORDER — ETOMIDATE 2 MG/ML IV SOLN
INTRAVENOUS | Status: DC | PRN
  Administered 2016-10-27: 20 mg via INTRAVENOUS

## 2016-10-27 MED ORDER — PROPOFOL 1000 MG/100ML IV EMUL
5.0000 ug/kg/min | Freq: Once | INTRAVENOUS | Status: AC
  Administered 2016-10-27: 20 ug/kg/min via INTRAVENOUS

## 2016-10-27 NOTE — ED Notes (Signed)
The pt had an iv by ems  He was given  and sl nitro x 1 by ems.Marland Kitchen He was also cardioverted at 100 by gems on the way here

## 2016-10-27 NOTE — ED Notes (Signed)
Lasix given   Pt still agitated  Suctioning him frequently.  bolkused with 10 mcg propdol

## 2016-10-27 NOTE — H&P (Addendum)
PULMONARY / CRITICAL CARE MEDICINE   Name: [name] MRN: [id number] DOB: 12/26/1950    ADMISSION DATE:  10/27/2016 CONSULTATION DATE:  10/27/16  REFERRING MD:  Dr Anitra Lauth  CHIEF COMPLAINT:  Acute respiratory failure  HISTORY OF PRESENT ILLNESS:   [name] is a 66 y.o. male with fatty liver disease, CAD, DM, HTN, HLD, anxiety who presented with acute respiratory failure to the [facility] ED.  His wife says [name] is chronically short of breath on exertion but was able to walk around the grocery store without any problems yesterday. Today, he was much more out of breath at rest than usual despite not having fevers, chills, a cough. He had worse swelling in his legs and a rash from fleas, which he is taking hydroxyzine for. EMS was called and found him in extremis complaining of chest pain, shortness of breath. He was found to have a wide complex tachycardia and due to signs of poor perfusion was given a dose of synchronized cardioversion to no effect. He also was given a full aspirin and a sublingual nitroglycerin. On arrival to the ED, his oxygen saturations remained critically low despite NRB and BiPAP so the decision was made to intubate him.   PAST MEDICAL HISTORY :  He  has a past medical history of Blockage of coronary artery of heart (HCC) (10/17/2011); Diabetes mellitus (10/17/2011); Edema leg; Emphysema; Excessive ear wax; Fatty liver; Hernia; Hyperlipidemia; Hypertension; Leaky heart valve; Neuropathy; Rheumatic fever; Slow urinary stream; and TIA (transient ischemic attack).  PAST SURGICAL HISTORY: He  has a past surgical history that includes Lithotripsy and Circumcision.  Allergies  Allergen Reactions  . Cashew Nut Oil Palpitations  . Percocet [Oxycodone-Acetaminophen] Other (See Comments)    Hallucintaions    No current facility-administered medications on file prior to encounter.    Current Outpatient Prescriptions on File Prior to Encounter  Medication Sig  .  aspirin EC 81 MG tablet Take 81 mg by mouth daily.  Marland Kitchen atorvastatin (LIPITOR) 20 MG tablet Take 20 mg by mouth every evening.   . gabapentin (NEURONTIN) 300 MG capsule Take 300 mg by mouth 4 (four) times daily. Takes 2 in the morning and 1 at night  . Potassium Chloride ER 20 MEQ TBCR Take 20 mEq by mouth daily.  . tamsulosin (FLOMAX) 0.4 MG CAPS capsule Take 0.4 mg by mouth daily.  . pantoprazole (PROTONIX) 40 MG tablet Take 1 tablet (40 mg total) by mouth daily.    FAMILY HISTORY:  His indicated that his mother is deceased. He indicated that his father is deceased. He indicated that the status of his sister is unknown.    SOCIAL HISTORY: He  reports that he has never smoked. He has never used smokeless tobacco. He reports that he does not drink alcohol or use drugs.  REVIEW OF SYSTEMS:   Unable to be obtained from intubated patient aside from HPI which was obtained from his wife and chart review    VITAL SIGNS: BP (!) 87/65   Pulse (!) 107   Temp 98 F (36.7 C)   Resp 20   Ht  (1.854 m)   Wt 93 kg (205 lb)   SpO2 100%   BMI 27.05 kg/m   HEMODYNAMICS:    VENTILATOR SETTINGS: Vent Mode: PRVC FiO2 (%):  [80 %-100 %] 80 % Set Rate:  [20 bmp] 20 bmp Vt Set:  [409 mL] 620 mL PEEP:  [12 cmH20] 12 cmH20 Plateau Pressure:  [27 cmH20] 27  cmH20  INTAKE / OUTPUT: No intake/output data recorded.  PHYSICAL EXAMINATION: General:  Awake on ventilator and propofol, follows commands, no distress, obese Neuro:  Moves all extremities 5/5 strength HEENT:  MMM Cardiovascular:  Tachycardic, no detectable murmurs rubs or gallops Lungs:  Coarse breath sounds in all lung fields Abdomen:  Obese, soft, nontender, nondistended Musculoskeletal:  3+ pitting edema in both legs and thighs Skin:  Fleas (?) present over skin   LABS:  BMET  Recent Labs Lab 10/27/16 2012 10/27/16 2022  NA 140 136  K 4.3 4.4  CL 106 103  CO2  --  21*  BUN 17 13  CREATININE 1.00 1.16  GLUCOSE  255* 252*    Electrolytes  Recent Labs Lab 10/27/16 2022  CALCIUM 8.3*    CBC  Recent Labs Lab 10/27/16 2012 10/27/16 2022  WBC  --  16.4*  HGB 13.9 12.9*  HCT 41.0 40.5  PLT  --  324    Coag's No results for input(s): APTT, INR in the last 168 hours.  Sepsis Markers  Recent Labs Lab 10/27/16 2028  LATICACIDVEN 4.02*    ABG  Recent Labs Lab 10/27/16 2034  PHART 7.258*  PCO2ART 52.5*  PO2ART 60.0*    Liver Enzymes  Recent Labs Lab 10/27/16 2022  AST 30  ALT 34  ALKPHOS 160*  BILITOT 0.5  ALBUMIN 3.6    Cardiac Enzymes No results for input(s): TROPONINI, PROBNP in the last 168 hours.  Glucose  Recent Labs Lab 10/27/16 2005  GLUCAP 237*    Imaging Dg Chest Portable 1 View  Result Date: 10/27/2016 CLINICAL DATA:  Status post intubation and respiratory failure EXAM: PORTABLE CHEST 1 VIEW COMPARISON:  10/10/2013 FINDINGS: Cardiac shadow is enlarged. Endotracheal tube is noted in satisfactory position. Nasogastric catheter has been placed but lies in the proximal esophagus at the level of the thoracic inlet. This raises suspicion for coiling within the posterior rings. Significant densities noted throughout the right lung likely related to asymmetric pulmonary edema. Mild patchy changes are noted in the left lung base. IMPRESSION: Endotracheal tube in satisfactory position. Significant increased density particularly within the right lung consistent with pulmonary edema. Some left basilar atelectatic changes are noted. Gastric catheter is noted within the proximal esophagus. This raises suspicion for coiling proximally. Should be withdrawn and readvanced. Electronically Signed   By: Alcide Clever M.D.   On: 10/27/2016 20:41    DISCUSSION: [name] is a 66 y.o. male  with fatty liver disease, CAD, DM, HTN, HLD, anxiety who presented with acute respiratory failure to the [facility] ED. His CXR shows multifocal diffuse patchy infiltrates that could  be from acute CHF exacerbation, severe community acquired PNA +/- ARDS. He does not have any exposure history to drugs or inhalants to suggest AIP, acute HP, or acute eosinophillic pneumonia. He is intubated on high level of ventilator support for hypoxemia.   ASSESSMENT / PLAN:  PULMONARY A: As above, acute respiratory failure from CHF or PNA +/- ARDS P:   High vent support at the moment Will wean per ARDSNet protocols and use VAP protection  CARDIOVASCULAR A:  Suspect HFpEF in exacerbation LBBB, suspect chronic P:  Will diurese and get echocardiogram Trend troponins  RENAL A:   AKI Lactic acidosis P:   From shock state. Will monitor with labs  GASTROINTESTINAL A:   Fatty liver disease, severity unknown P:   NPO except meds OG tube  HEMATOLOGIC A:   Leukocytosis may be from sepsis  P:  DVT prophylaxis  INFECTIOUS A:   Sepsis from CAP P:   Ceftriaxone and Azithromycin ordered Blood cultures Lower respiratory cultures  ENDOCRINE A:   DMII, poorly controlled P:   Will use sliding scale insulin. Wife was very worried about insulin but I reassured her we will monitor blood glucoses closely for hypoglycemia  NEUROLOGIC A:   Sedation for mechanical ventilation P:   RASS goal: 0 Propofol PRN fentanyl and versed   FAMILY  - Updates: Wife updated at bedside  - Inter-disciplinary family meet or Palliative Care meeting due by:  10/04/16  45 critical care mins spent on this admission  Cornell Barman MD Pulmonary and Critical Care Medicine [facility] Pager: [phone]  10/27/2016, 11:28 PM

## 2016-10-27 NOTE — ED Triage Notes (Signed)
The pt arrived by gems from home with chest pain sob and tachycardia.  Labored breathing on arrival on a non-rebreather mask

## 2016-10-27 NOTE — ED Notes (Signed)
Pt waking up more  Propofol increased to 35 mcg/kg/min  Critical  Care at the bedside  Instructions to go up on the med pt opening his eyes and and reaching up for the tube

## 2016-10-27 NOTE — ED Notes (Signed)
Propofol increased to 45 mcg/kg /min per the sritical care doctor   Due to the pts agitation

## 2016-10-27 NOTE — ED Notes (Signed)
Pt bolused with  for agitation  Rate increased to 25 mcgkg/min  Cannot change  Per rate increase  For ???

## 2016-10-27 NOTE — ED Provider Notes (Signed)
[facility]-EMERGENCY DEPT Provider Note   CSN: [id number] Arrival date & time: 10/27/16  2000     History   Chief Complaint Chief Complaint  Patient presents with  . Chest Pain    HPI [name] is a 66 y.o. male.  The history is provided by the patient, the EMS personnel and the spouse.  Chest Pain   This is a new problem. The current episode started 3 to 5 hours ago. The problem occurs constantly. The problem has not changed since onset.The pain is associated with rest. The pain is present in the substernal region. The pain is at a severity of 6/10. The pain is moderate. The quality of the pain is described as pressure-like. The pain does not radiate. Associated symptoms include cough and sputum production. He has tried nothing for the symptoms. The treatment provided no relief. Risk factors include male gender, lack of exercise and obesity.  His past medical history is significant for COPD and CHF.    Past Medical History:  Diagnosis Date  . 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 Diabetes mellitus 10/17/2011   newly dx today  . Edema leg    right leg has leaky valve and right foot swells  . Emphysema   . Excessive ear wax   . Fatty liver   . Hernia    near navel  . Hyperlipidemia   . Hypertension   . Leaky heart valve   . Neuropathy   . Rheumatic fever   . Slow urinary stream   . TIA (transient ischemic attack)     Patient Active Problem List   Diagnosis Date Noted  . Acute respiratory failure (HCC) 10/27/2016  . Left-sided weakness 05/04/2015  . TIA (transient ischemic attack) : Rule out. 05/04/2015  . Hyperlipidemia 05/04/2015  . Chest pain 10/10/2013  . SOB (shortness of breath) 10/10/2013  . Hyperglycemia without ketosis 10/17/2011  . Diabetes mellitus (HCC) 10/17/2011  . HTN (hypertension) 10/17/2011  . Foot drop, left 10/17/2011  . Impacted cerumen of right ear 10/17/2011  . Dyspnea 10/17/2011  . History of  TIA (transient ischemic attack) 10/17/2011  . Neuropathy (HCC) 10/17/2011  . Aortic insufficiency 03/16/2011  . Chest pain 03/16/2011    Past Surgical History:  Procedure Laterality Date  . CIRCUMCISION    . LITHOTRIPSY         Home Medications    Prior to Admission medications   Medication Sig Start Date End Date Taking? Authorizing Provider  aspirin EC 81 MG tablet Take 81 mg by mouth daily.   Yes Historical Provider, MD  atorvastatin (LIPITOR) 20 MG tablet Take 20 mg by mouth every evening.    Yes Historical Provider, MD  finasteride (PROSCAR) 5 MG tablet Take 5 mg by mouth daily.   Yes Historical Provider, MD  gabapentin (NEURONTIN) 300 MG capsule Take 300 mg by mouth 4 (four) times daily. Takes 2 in the morning and 1 at night   Yes Historical Provider, MD  glipiZIDE (GLUCOTROL XL) 10 MG 24 hr tablet Take 10 mg by mouth daily with breakfast.   Yes Historical Provider, MD  hydrOXYzine (VISTARIL) 50 MG capsule Take 50 mg by mouth 3 (three) times daily.   Yes Historical Provider, MD  metFORMIN (GLUCOPHAGE-XR) 500 MG 24 hr tablet Take 500 mg by mouth 2 (two) times daily.   Yes Historical Provider, MD  montelukast (SINGULAIR) 10 MG tablet Take 10 mg by mouth at bedtime.  Yes Historical Provider, MD  Potassium Chloride ER 20 MEQ TBCR Take 20 mEq by mouth daily. 05/03/15  Yes Historical Provider, MD  tamsulosin (FLOMAX) 0.4 MG CAPS capsule Take 0.4 mg by mouth daily. 05/03/15  Yes Historical Provider, MD  pantoprazole (PROTONIX) 40 MG tablet Take 1 tablet (40 mg total) by mouth daily. 10/11/13   Estela Isaiah Blakes, MD    Family History Family History  Problem Relation Age of Onset  . Emphysema Mother   . Aneurysm Mother   . Emphysema Father   . Coronary artery disease Brother   . Coronary artery disease Brother   . Coronary artery disease Sister     Social History Social History  Substance Use Topics  . Smoking status: Never Smoker  . Smokeless tobacco: Never Used  .  Alcohol use No     Allergies   Cashew nut oil and Percocet [oxycodone-acetaminophen]   Review of Systems Review of Systems  Unable to perform ROS: Acuity of condition  Respiratory: Positive for cough and sputum production.   Cardiovascular: Positive for chest pain.     Physical Exam Updated Vital Signs BP (!) 87/65   Pulse (!) 107   Temp 98 F (36.7 C)   Resp 20   Ht  (1.854 m)   Wt 93 kg   SpO2 100%   BMI 27.05 kg/m   Physical Exam  Constitutional: He appears well-developed and well-nourished. He has a sickly appearance. He appears ill. He appears distressed.  HENT:  Head: Normocephalic and atraumatic.  Eyes: Conjunctivae and EOM are normal.  Neck: Neck supple.  Cardiovascular: Regular rhythm.  Tachycardia present.   No murmur heard. Pulmonary/Chest: Accessory muscle usage present. Tachypnea noted. He is in respiratory distress. He has rhonchi in the right middle field, the right lower field, the left middle field and the left lower field.  Abdominal: Soft. There is no tenderness.  Musculoskeletal: He exhibits no edema.  Neurological: He is alert. GCS eye subscore is 4. GCS verbal subscore is 4. GCS motor subscore is 6.  Skin: Skin is warm and dry. No rash noted.  Psychiatric: He has a normal mood and affect. His speech is rapid and/or pressured.  Nursing note and vitals reviewed.    ED Treatments / Results  Labs (all labs ordered are listed, but only abnormal results are displayed) Labs Reviewed  CBC WITH DIFFERENTIAL/PLATELET - Abnormal; Notable for the following:       Result Value   WBC 16.4 (*)    Hemoglobin 12.9 (*)    Neutro Abs 11.8 (*)    All other components within normal limits  COMPREHENSIVE METABOLIC PANEL - Abnormal; Notable for the following:    CO2 21 (*)    Glucose, Bld 252 (*)    Calcium 8.3 (*)    Alkaline Phosphatase 160 (*)    All other components within normal limits  BRAIN NATRIURETIC PEPTIDE - Abnormal; Notable for the  following:    B Natriuretic Peptide 495.2 (*)    All other components within normal limits  URINALYSIS, ROUTINE W REFLEX MICROSCOPIC - Abnormal; Notable for the following:    Glucose, UA 50 (*)    Protein, ur 30 (*)    Squamous Epithelial / LPF 0-5 (*)    All other components within normal limits  CBG MONITORING, ED - Abnormal; Notable for the following:    Glucose-Capillary 237 (*)    All other components within normal limits  I-STAT CHEM 8, ED -  Abnormal; Notable for the following:    Glucose, Bld 255 (*)    Calcium, Ion 1.05 (*)    All other components within normal limits  I-STAT CG4 LACTIC ACID, ED - Abnormal; Notable for the following:    Lactic Acid, Venous 4.02 (*)    All other components within normal limits  I-STAT ARTERIAL BLOOD GAS, ED - Abnormal; Notable for the following:    pH, Arterial 7.258 (*)    pCO2 arterial 52.5 (*)    pO2, Arterial 60.0 (*)    Acid-base deficit 4.0 (*)    All other components within normal limits  CULTURE, BLOOD (ROUTINE X 2)  CULTURE, BLOOD (ROUTINE X 2)  LACTIC ACID, PLASMA  I-STAT TROPOININ, ED  I-STAT CG4 LACTIC ACID, ED    EKG  EKG Interpretation  Date/Time:  Saturday October 27 2016 20:02:21 EDT Ventricular Rate:  148 PR Interval:    QRS Duration: 140 QT Interval:  330 QTC Calculation: 518 R Axis:   -52 Text Interpretation:  Junctional tachycardia Left bundle branch block Baseline wander in lead(s) III No significant change since last tracing Confirmed by Anitra Lauth  MD, Alphonzo Lemmings ([id number]) on 10/27/2016 8:35:26 PM       Radiology Dg Chest Portable 1 View  Result Date: 10/27/2016 CLINICAL DATA:  Status post intubation and respiratory failure EXAM: PORTABLE CHEST 1 VIEW COMPARISON:  10/10/2013 FINDINGS: Cardiac shadow is enlarged. Endotracheal tube is noted in satisfactory position. Nasogastric catheter has been placed but lies in the proximal esophagus at the level of the thoracic inlet. This raises suspicion for coiling within the  posterior rings. Significant densities noted throughout the right lung likely related to asymmetric pulmonary edema. Mild patchy changes are noted in the left lung base. IMPRESSION: Endotracheal tube in satisfactory position. Significant increased density particularly within the right lung consistent with pulmonary edema. Some left basilar atelectatic changes are noted. Gastric catheter is noted within the proximal esophagus. This raises suspicion for coiling proximally. Should be withdrawn and readvanced. Electronically Signed   By: Alcide Clever M.D.   On: 10/27/2016 20:41    Procedures Procedure Name: Intubation Date/Time: 10/27/2016 11:26 PM Performed by: Stacy Gardner Pre-anesthesia Checklist: Patient identified and Emergency Drugs available Oxygen Delivery Method: Non-rebreather mask Preoxygenation: Pre-oxygenation with 100% oxygen Intubation Type: Rapid sequence Ventilation: Mask ventilation without difficulty Laryngoscope Size: Glidescope Grade View: Grade I Tube size: 7.5 mm Number of attempts: 1 Airway Equipment and Method: Rigid stylet Placement Confirmation: ETT inserted through vocal cords under direct vision,  Positive ETCO2,  CO2 detector and Breath sounds checked- equal and bilateral Secured at: 23 cm Tube secured with: ETT holder      (including critical care time)  Medications Ordered in ED Medications  etomidate (AMIDATE) injection (20 mg Intravenous Given 10/27/16 2012)  rocuronium (ZEMURON) injection (100 mg Intravenous Given 10/27/16 2013)  propofol (DIPRIVAN) 1000 MG/100ML infusion (not administered)  cefTRIAXone (ROCEPHIN) 1 g in dextrose 5 % 50 mL IVPB (not administered)  azithromycin (ZITHROMAX) 500 mg in dextrose 5 % 250 mL IVPB (not administered)  fentaNYL (SUBLIMAZE) injection 50 mcg (50 mcg Intravenous Given 10/27/16 2259)  midazolam (VERSED) injection 1 mg (1 mg Intravenous Given 10/27/16 2256)  propofol (DIPRIVAN) 1000 MG/100ML infusion (20 mcg/kg/min   93 kg Intravenous New Bag/Given 10/27/16 2043)  cefTRIAXone (ROCEPHIN) 1 g in dextrose 5 % 50 mL IVPB (1 g Intravenous New Bag/Given 10/27/16 2208)  azithromycin (ZITHROMAX) 500 mg in dextrose 5 % 250 mL IVPB (0 mg Intravenous Stopped 10/27/16  2127)  sodium chloride 0.9 % bolus 1,000 mL (0 mLs Intravenous Stopped 10/27/16 2131)  furosemide (LASIX) injection 40 mg (40 mg Intravenous Given 10/27/16 2231)     Initial Impression / Assessment and Plan / ED Course  I have reviewed the triage vital signs and the nursing notes.  Pertinent labs & imaging results that were available during my care of the patient were reviewed by me and considered in my medical decision making (see chart for details).     66 year old male with history of COPD and CHF which were recently diagnosed presents in the setting of respiratory distress. Per EMS and wife patient began complaining of mild coughing and shortness of breath several hours prior to EMS being called. On arrival of EMS patient was in respiratory distress with oxygen saturations which were unable to be detected. Patient started on nonrebreather. In route EMS found patient to have severe tachycardia and they were concerned patient was in A. fib with RVR. Patient had synchronized cardioversion performed without change in rhythm. Patient with left bundle branch block noted by EMS and code STEMI was initiated.  On arrival patient in respiratory distress and initial oxygen saturations were in the 40s. Patient started on BiPAP with improvement in respiratory effort as well as oxygen saturation. Patient with extreme work of breathing. Cardiology quickly evaluated patient and they do not believe presentation was consistent with STEMI. Code STEMI was discontinued. Due to work of breathing and hypoxemia patient intubated as noted above without competition. Chest x-ray revealed ET tube in appropriate location and significant congestion and bilateral lungs. Presentation  consistent with pneumonia versus CHF exacerbation. Patient does have 2+ pitting edema in bilateral lower extreme gaze which wife as reported has been worsening over last several days. Patient initially given 1 L normal saline bolus with concern for infection as patient does have lactic acidosis, elevated white blood cell count and respiratory failure. Additionally patient given community-acquired pneumonia treatment. After further evaluation of labs and imaging patient given Lasix with concern for CHF as primary etiology of hypoxic respiratory failure. Mental status and respiratory status improved on ventilator as well as tachycardia. Patient stable at time of transfer of care to critical care.  Final Clinical Impressions(s) / ED Diagnoses   Final diagnoses:  Acute respiratory failure with hypoxia (HCC)  Lactic acid acidosis  Leukocytosis, unspecified type  Tachycardia    New Prescriptions New Prescriptions   No medications on file     Stacy Gardner, MD 10/27/16 4540    Gwyneth Sprout, MD 10/28/16 2303

## 2016-10-27 NOTE — ED Notes (Signed)
In attempted to   Hand rocephin but the cap was loose and most of meds escaped.  Pharmacy will send another rocephin to replace   The other

## 2016-10-27 NOTE — ED Notes (Signed)
Intubated by ed res 

## 2016-10-27 NOTE — ED Notes (Signed)
The pt is more relaxed he has stopped  Moving and reaching toward the tube

## 2016-10-28 ENCOUNTER — Inpatient Hospital Stay (HOSPITAL_COMMUNITY): Payer: Medicare Other

## 2016-10-28 LAB — RESPIRATORY PANEL BY PCR
ADENOVIRUS-RVPPCR: NOT DETECTED
BORDETELLA PERTUSSIS-RVPCR: NOT DETECTED
CHLAMYDOPHILA PNEUMONIAE-RVPPCR: NOT DETECTED
CORONAVIRUS 229E-RVPPCR: NOT DETECTED
CORONAVIRUS HKU1-RVPPCR: NOT DETECTED
CORONAVIRUS NL63-RVPPCR: NOT DETECTED
Coronavirus OC43: NOT DETECTED
Influenza A: NOT DETECTED
Influenza B: NOT DETECTED
Metapneumovirus: NOT DETECTED
Mycoplasma pneumoniae: NOT DETECTED
PARAINFLUENZA VIRUS 3-RVPPCR: NOT DETECTED
Parainfluenza Virus 1: NOT DETECTED
Parainfluenza Virus 2: NOT DETECTED
Parainfluenza Virus 4: NOT DETECTED
RHINOVIRUS / ENTEROVIRUS - RVPPCR: NOT DETECTED
Respiratory Syncytial Virus: NOT DETECTED

## 2016-10-28 LAB — BLOOD CULTURE ID PANEL (REFLEXED)
ACINETOBACTER BAUMANNII: NOT DETECTED
CANDIDA ALBICANS: NOT DETECTED
CANDIDA GLABRATA: NOT DETECTED
CANDIDA TROPICALIS: NOT DETECTED
Candida krusei: NOT DETECTED
Candida parapsilosis: NOT DETECTED
Carbapenem resistance: NOT DETECTED
ENTEROBACTER CLOACAE COMPLEX: NOT DETECTED
ENTEROBACTERIACEAE SPECIES: DETECTED — AB
ENTEROCOCCUS SPECIES: NOT DETECTED
ESCHERICHIA COLI: DETECTED — AB
HAEMOPHILUS INFLUENZAE: NOT DETECTED
Klebsiella oxytoca: NOT DETECTED
Klebsiella pneumoniae: NOT DETECTED
Listeria monocytogenes: NOT DETECTED
NEISSERIA MENINGITIDIS: NOT DETECTED
PSEUDOMONAS AERUGINOSA: NOT DETECTED
Proteus species: NOT DETECTED
STREPTOCOCCUS AGALACTIAE: NOT DETECTED
STREPTOCOCCUS SPECIES: NOT DETECTED
Serratia marcescens: NOT DETECTED
Staphylococcus aureus (BCID): NOT DETECTED
Staphylococcus species: NOT DETECTED
Streptococcus pneumoniae: NOT DETECTED
Streptococcus pyogenes: NOT DETECTED

## 2016-10-28 LAB — GLUCOSE, CAPILLARY
GLUCOSE-CAPILLARY: 140 mg/dL — AB (ref 65–99)
GLUCOSE-CAPILLARY: 147 mg/dL — AB (ref 65–99)
GLUCOSE-CAPILLARY: 241 mg/dL — AB (ref 65–99)
GLUCOSE-CAPILLARY: 98 mg/dL (ref 65–99)
Glucose-Capillary: 188 mg/dL — ABNORMAL HIGH (ref 65–99)
Glucose-Capillary: 166 mg/dL — ABNORMAL HIGH (ref 65–99)
Glucose-Capillary: 101 mg/dL — ABNORMAL HIGH (ref 65–99)

## 2016-10-28 LAB — CBC
HCT: 33.7 % — ABNORMAL LOW (ref 39.0–52.0)
HCT: 31.6 % — ABNORMAL LOW (ref 39.0–52.0)
Hemoglobin: 10.6 g/dL — ABNORMAL LOW (ref 13.0–17.0)
Hemoglobin: 10.3 g/dL — ABNORMAL LOW (ref 13.0–17.0)
MCH: 27.7 pg (ref 26.0–34.0)
MCH: 28.5 pg (ref 26.0–34.0)
MCHC: 31.5 g/dL (ref 30.0–36.0)
MCHC: 32.6 g/dL (ref 30.0–36.0)
MCV: 88 fL (ref 78.0–100.0)
MCV: 87.3 fL (ref 78.0–100.0)
PLATELETS: 256 10*3/uL (ref 150–400)
PLATELETS: 231 10*3/uL (ref 150–400)
RBC: 3.83 MIL/uL — AB (ref 4.22–5.81)
RBC: 3.62 MIL/uL — ABNORMAL LOW (ref 4.22–5.81)
RDW: 14.5 % (ref 11.5–15.5)
RDW: 15 % (ref 11.5–15.5)
WBC: 13.1 10*3/uL — AB (ref 4.0–10.5)
WBC: 9.5 10*3/uL (ref 4.0–10.5)

## 2016-10-28 LAB — BASIC METABOLIC PANEL
ANION GAP: 11 (ref 5–15)
Anion gap: 10 (ref 5–15)
BUN: 15 mg/dL (ref 6–20)
BUN: 14 mg/dL (ref 6–20)
CALCIUM: 7.6 mg/dL — AB (ref 8.9–10.3)
CO2: 24 mmol/L (ref 22–32)
CO2: 24 mmol/L (ref 22–32)
CREATININE: 1.19 mg/dL (ref 0.61–1.24)
CREATININE: 1.17 mg/dL (ref 0.61–1.24)
Calcium: 7.7 mg/dL — ABNORMAL LOW (ref 8.9–10.3)
Chloride: 106 mmol/L (ref 101–111)
Chloride: 104 mmol/L (ref 101–111)
GFR calc Af Amer: >60 mL/min (ref >60)
Glucose, Bld: 207 mg/dL — ABNORMAL HIGH (ref 65–99)
Glucose, Bld: 153 mg/dL — ABNORMAL HIGH (ref 65–99)
Potassium: 4.5 mmol/L (ref 3.5–5.1)
Potassium: 3.6 mmol/L (ref 3.5–5.1)
SODIUM: 140 mmol/L (ref 135–145)
SODIUM: 139 mmol/L (ref 135–145)

## 2016-10-28 LAB — POCT I-STAT 3, ART BLOOD GAS (G3+)
Bicarbonate: 24.3 mmol/L (ref 20.0–28.0)
Bicarbonate: 26 mmol/L (ref 20.0–28.0)
O2 Saturation: 99 %
O2 Saturation: 56 %
PCO2 ART: 37.3 mmHg (ref 32.0–48.0)
PCO2 ART: 46 mmHg (ref 32.0–48.0)
PH ART: 7.42 (ref 7.350–7.450)
PH ART: 7.358 (ref 7.350–7.450)
Patient temperature: 97.8
Patient temperature: 36.5
TCO2: 25 mmol/L (ref 0–100)
TCO2: 27 mmol/L (ref 0–100)
pO2, Arterial: 139 mmHg — ABNORMAL HIGH (ref 83.0–108.0)
pO2, Arterial: 30 mmHg — CL (ref 83.0–108.0)

## 2016-10-28 LAB — MAGNESIUM: Magnesium: 1.5 mg/dL — ABNORMAL LOW (ref 1.7–2.4)

## 2016-10-28 LAB — TROPONIN I
TROPONIN I: 0.2 ng/mL — AB (ref <0.03)
TROPONIN I: 0.19 ng/mL — AB (ref <0.03)
TROPONIN I: 0.17 ng/mL — AB (ref <0.03)

## 2016-10-28 LAB — TRIGLYCERIDES
TRIGLYCERIDES: 106 mg/dL (ref <150)
Triglycerides: 83 mg/dL (ref <150)

## 2016-10-28 LAB — PROCALCITONIN: PROCALCITONIN: 0.81 ng/mL

## 2016-10-28 LAB — STREP PNEUMONIAE URINARY ANTIGEN: STREP PNEUMO URINARY ANTIGEN: NEGATIVE

## 2016-10-28 LAB — MRSA PCR SCREENING: MRSA BY PCR: NEGATIVE

## 2016-10-28 LAB — LACTIC ACID, PLASMA: Lactic Acid, Venous: 1.9 mmol/L (ref 0.5–1.9)

## 2016-10-28 LAB — PHOSPHORUS: PHOSPHORUS: 3.5 mg/dL (ref 2.5–4.6)

## 2016-10-28 MED ORDER — FUROSEMIDE 10 MG/ML IJ SOLN
40.0000 mg | Freq: Two times a day (BID) | INTRAMUSCULAR | Status: DC
  Administered 2016-10-28: 40 mg via INTRAVENOUS
  Filled 2016-10-28: qty 4

## 2016-10-28 MED ORDER — FENTANYL CITRATE (PF) 100 MCG/2ML IJ SOLN
50.0000 ug | INTRAMUSCULAR | Status: DC | PRN
  Administered 2016-10-28 – 2016-10-31 (×13): 50 ug via INTRAVENOUS
  Filled 2016-10-28 (×14): qty 2

## 2016-10-28 MED ORDER — POTASSIUM CHLORIDE 20 MEQ/15ML (10%) PO SOLN
20.0000 meq | Freq: Two times a day (BID) | ORAL | Status: AC
  Administered 2016-10-28 (×2): 20 meq
  Filled 2016-10-28 (×3): qty 15

## 2016-10-28 MED ORDER — FUROSEMIDE 10 MG/ML IJ SOLN
40.0000 mg | Freq: Three times a day (TID) | INTRAMUSCULAR | Status: DC
  Administered 2016-10-28 (×2): 40 mg via INTRAVENOUS
  Filled 2016-10-28 (×3): qty 4

## 2016-10-28 MED ORDER — INSULIN ASPART 100 UNIT/ML ~~LOC~~ SOLN
0.0000 [IU] | SUBCUTANEOUS | Status: DC
  Administered 2016-10-28: 3 [IU] via SUBCUTANEOUS

## 2016-10-28 MED ORDER — POTASSIUM CHLORIDE 20 MEQ/15ML (10%) PO SOLN: 20.0000 meq | Freq: Two times a day (BID) | ORAL | Status: DC

## 2016-10-28 MED ORDER — MIDAZOLAM HCL 2 MG/2ML IJ SOLN
1.0000 mg | INTRAMUSCULAR | Status: DC | PRN
  Administered 2016-10-28 – 2016-10-30 (×12): 1 mg via INTRAVENOUS
  Filled 2016-10-28 (×12): qty 2

## 2016-10-28 MED ORDER — MAGNESIUM SULFATE 4 GM/100ML IV SOLN
4.0000 g | Freq: Once | INTRAVENOUS | Status: AC
  Administered 2016-10-28: 4 g via INTRAVENOUS
  Filled 2016-10-28: qty 100

## 2016-10-28 MED ORDER — ENOXAPARIN SODIUM 40 MG/0.4ML ~~LOC~~ SOLN
40.0000 mg | SUBCUTANEOUS | Status: DC
  Administered 2016-10-28 – 2016-11-02 (×6): 40 mg via SUBCUTANEOUS
  Filled 2016-10-28 (×6): qty 0.4

## 2016-10-28 MED ORDER — PANTOPRAZOLE SODIUM 40 MG PO TBEC: 40.0000 mg | DELAYED_RELEASE_TABLET | Freq: Every day | ORAL | Status: DC

## 2016-10-28 MED ORDER — DOCUSATE SODIUM 50 MG/5ML PO LIQD
100.0000 mg | Freq: Two times a day (BID) | ORAL | Status: DC | PRN
  Filled 2016-10-28: qty 10

## 2016-10-28 MED ORDER — PROPOFOL 1000 MG/100ML IV EMUL
0.0000 ug/kg/min | INTRAVENOUS | Status: DC
  Administered 2016-10-28: 50 ug/kg/min via INTRAVENOUS
  Administered 2016-10-28: 30 ug/kg/min via INTRAVENOUS
  Administered 2016-10-28: 40 ug/kg/min via INTRAVENOUS
  Administered 2016-10-29 (×5): 50 ug/kg/min via INTRAVENOUS
  Administered 2016-10-29: 40 ug/kg/min via INTRAVENOUS
  Administered 2016-10-29 – 2016-10-30 (×4): 50 ug/kg/min via INTRAVENOUS
  Filled 2016-10-28 (×14): qty 100

## 2016-10-28 MED ORDER — FENTANYL CITRATE (PF) 100 MCG/2ML IJ SOLN
50.0000 ug | INTRAMUSCULAR | Status: DC | PRN
  Filled 2016-10-28: qty 2

## 2016-10-28 MED ORDER — PANTOPRAZOLE SODIUM 40 MG PO PACK
40.0000 mg | PACK | Freq: Every day | ORAL | Status: DC
  Administered 2016-10-28 – 2016-10-31 (×4): 40 mg
  Filled 2016-10-28 (×5): qty 20

## 2016-10-28 MED ORDER — TAMSULOSIN HCL 0.4 MG PO CAPS
0.4000 mg | ORAL_CAPSULE | Freq: Every day | ORAL | Status: DC
  Filled 2016-10-28: qty 1

## 2016-10-28 MED ORDER — SODIUM CHLORIDE 0.9 % IV SOLN: 250.0000 mL | INTRAVENOUS | Status: DC | PRN

## 2016-10-28 MED ORDER — DEXTROSE 5 % IV SOLN
2.0000 g | INTRAVENOUS | Status: DC
  Administered 2016-10-28 – 2016-11-01 (×5): 2 g via INTRAVENOUS
  Filled 2016-10-28 (×6): qty 2

## 2016-10-28 MED ORDER — DEXTROSE 5 % IV SOLN
500.0000 mg | INTRAVENOUS | Status: AC
  Administered 2016-10-28 – 2016-10-31 (×4): 500 mg via INTRAVENOUS
  Filled 2016-10-28 (×4): qty 500

## 2016-10-28 MED ORDER — ORAL CARE MOUTH RINSE
15.0000 mL | Freq: Four times a day (QID) | OROMUCOSAL | Status: DC
  Administered 2016-10-28 – 2016-11-01 (×16): 15 mL via OROMUCOSAL

## 2016-10-28 MED ORDER — FENTANYL CITRATE (PF) 100 MCG/2ML IJ SOLN
50.0000 ug | INTRAMUSCULAR | Status: AC | PRN
  Administered 2016-10-29 – 2016-10-30 (×3): 50 ug via INTRAVENOUS
  Filled 2016-10-28 (×4): qty 2

## 2016-10-28 MED ORDER — INSULIN ASPART 100 UNIT/ML ~~LOC~~ SOLN
0.0000 [IU] | SUBCUTANEOUS | Status: DC
  Administered 2016-10-28 (×2): 3 [IU] via SUBCUTANEOUS
  Administered 2016-10-28: 4 [IU] via SUBCUTANEOUS
  Administered 2016-10-29 (×2): 3 [IU] via SUBCUTANEOUS
  Administered 2016-10-29 – 2016-10-30 (×2): 4 [IU] via SUBCUTANEOUS
  Administered 2016-10-30: 7 [IU] via SUBCUTANEOUS
  Administered 2016-10-30: 4 [IU] via SUBCUTANEOUS
  Administered 2016-10-30 – 2016-10-31 (×4): 3 [IU] via SUBCUTANEOUS
  Administered 2016-10-31 – 2016-11-01 (×7): 4 [IU] via SUBCUTANEOUS
  Administered 2016-11-01: 3 [IU] via SUBCUTANEOUS
  Administered 2016-11-01 – 2016-11-02 (×3): 4 [IU] via SUBCUTANEOUS

## 2016-10-28 MED ORDER — ATORVASTATIN CALCIUM 20 MG PO TABS
20.0000 mg | ORAL_TABLET | Freq: Every evening | ORAL | Status: DC
  Administered 2016-10-28 – 2016-11-01 (×3): 20 mg via ORAL
  Filled 2016-10-28 (×6): qty 1

## 2016-10-28 MED ORDER — FINASTERIDE 5 MG PO TABS
5.0000 mg | ORAL_TABLET | Freq: Every day | ORAL | Status: DC
  Filled 2016-10-28: qty 1

## 2016-10-28 MED ORDER — PROPOFOL 1000 MG/100ML IV EMUL
INTRAVENOUS | Status: AC
  Filled 2016-10-28: qty 100

## 2016-10-28 MED ORDER — CHLORHEXIDINE GLUCONATE 0.12% ORAL RINSE (MEDLINE KIT)
15.0000 mL | Freq: Two times a day (BID) | OROMUCOSAL | Status: DC
  Administered 2016-10-28 – 2016-10-31 (×7): 15 mL via OROMUCOSAL

## 2016-10-28 MED ORDER — ASPIRIN EC 81 MG PO TBEC
81.0000 mg | DELAYED_RELEASE_TABLET | Freq: Every day | ORAL | Status: DC
  Filled 2016-10-28: qty 1

## 2016-10-28 MED ORDER — PROPOFOL 1000 MG/100ML IV EMUL
0.0000 ug/kg/min | INTRAVENOUS | Status: DC
  Administered 2016-10-28: 40 ug/kg/min via INTRAVENOUS
  Administered 2016-10-28: 35 ug/kg/min via INTRAVENOUS
  Administered 2016-10-28 (×2): 40 ug/kg/min via INTRAVENOUS
  Filled 2016-10-28: qty 100

## 2016-10-28 MED ORDER — ASPIRIN 81 MG PO CHEW
81.0000 mg | CHEWABLE_TABLET | Freq: Every day | ORAL | Status: DC
  Administered 2016-10-28 – 2016-11-01 (×5): 81 mg
  Filled 2016-10-28 (×5): qty 1

## 2016-10-28 NOTE — Progress Notes (Signed)
Received pt from ED, small brown colored bed bug like insect noted motionless on pt left thigh, insect bug removed and placed in specimen container, contact isolation initiated charge nurse notifed.

## 2016-10-28 NOTE — ED Notes (Signed)
Pt resting well at present  New bottle of propofol added to infusion

## 2016-10-28 NOTE — Progress Notes (Signed)
Few BUGS were found on and in the room. Education was provided to the wife. She stated: "I am stressed now and I need to have an interpreter to understand your accent" Charge nurse was notified and came to the room, gave same education to wife as well. Wife stated: "Do you have thousand dollars for treating bed bags? I do not." She collected her belongings, in double bags, as was told and made the decision to go home. "will call for update"  After her leaving another bug was found near the chair where she was sitting. Charge nurse aware, house coverage came and gave handout about proper care and what to do after patient leaves ([facility] will come with treatment) Daughter in law was updated as well, at the bedside at this time  (correct password given)

## 2016-10-28 NOTE — Progress Notes (Signed)
Pharmacy Antibiotic Note  [name] is a 66 y.o. male admitted on 10/27/2016 with pneumonia.  Pharmacy has been consulted for ceftriaxone dosing. Ceftriaxone does not need renal adjustment. Patient is also on azithromycin.  Plan: - Ceftriaxone 1g IV q24h - Azithromycin 500 mg IV q24h - Monitor C&S, CBC and duration of therapy - Pharmacy to sign off as no adjustment needed  Height:  (185.4 cm) Weight: 240 lb 11.9 oz (109.2 kg) IBW/kg (Calculated) : 79.9  Temp (24hrs), Avg:97.8 F (36.6 C), Min:97.7 F (36.5 C), Max:98 F (36.7 C)   Recent Labs Lab 10/27/16 2012 10/27/16 2022 10/27/16 2028 10/27/16 2335 10/28/16 0156 10/28/16 0235  WBC  --  16.4*  --   --   --  13.1*  CREATININE 1.00 1.16  --   --   --  1.19  LATICACIDVEN  --   --  4.02* 2.01* 1.9  --     Estimated Creatinine Clearance: 79.1 mL/min (by C-G formula based on SCr of 1.19 mg/dL).    Allergies  Allergen Reactions  . Cashew Nut Oil Palpitations  . Percocet [Oxycodone-Acetaminophen] Other (See Comments)    Hallucintaions    Antimicrobials this admission: Azithromycin 4/29 >> Ceftriaxone 4/29 >>  Microbiology results: 4/28 BCx: sent 4/29 MRSA PCR: negative  Thank you for allowing pharmacy to be a part of this patient's care.  Casilda Carls, PharmD, BCPS PGY-2 Infectious Diseases Pharmacy Resident Pager: [phone] 10/28/2016 9:33 AM

## 2016-10-28 NOTE — ED Notes (Signed)
Propofol decreased to 40 mcg/kg/min

## 2016-10-28 NOTE — Progress Notes (Signed)
CRITICAL VALUE ALERT  Critical value received: Troponin 0.20  Date of notification:  10/28/16  Time of notification:  0821  Critical value read back yes  Nurse who received alert:  Byrd Hesselbach  MD notified (1st page):  Dr. Marchelle Gearing  Time of first page: 0821  MD notified (2nd page):  Time of second page:  Responding MD:  10/28/16  Time MD responded: 1610

## 2016-10-28 NOTE — ED Notes (Signed)
Report called to robin rn  

## 2016-10-28 NOTE — Progress Notes (Signed)
PULMONARY  / CRITICAL CARE MEDICINE  Name: [name] MRN: [id number] DOB: March 06, 1951    LOS: 1  REFERRING MD :  Dr Anitra Lauth  CHIEF COMPLAINT:  Acute respiratory failure  BRIEF PATIENT DESCRIPTION: [name] is a 66 y.o. male with fatty liver disease, CAD, DM, HTN, HLD, anxiety who presented with acute respiratory failure to the [facility] ED.  His wife says [name] is chronically short of breath on exertion but was able to walk around the grocery store without any problems yesterday. Today, he was much more out of breath at rest than usual despite not having fevers, chills, a cough. He had worse swelling in his legs and a rash from fleas, which he is taking hydroxyzine for. EMS was called and found him in extremis complaining of chest pain, shortness of breath. He was found to have a wide complex tachycardia and due to signs of poor perfusion was given a dose of synchronized cardioversion to no effect. He also was given a full aspirin and a sublingual nitroglycerin. On arrival to the ED, his oxygen saturations remained critically low despite NRB and BiPAP so the decision was made to intubate him.  LINES / TUBES: PIV 4/28 ETT 4/28  CULTURES: BCx 4/28>>  ANTIBIOTICS: Azithromyin 4/28 Ceftriaxone 4/28  SIGNIFICANT EVENTS:  4/28 - acute respiratory failure requiring intubation; admission to ICU   PAST MEDICAL HISTORY :  Past Medical History:  Diagnosis Date  . 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 Diabetes mellitus 10/17/2011   newly dx today  . Edema leg    right leg has leaky valve and right foot swells  . Emphysema   . Excessive ear wax   . Fatty liver   . Hernia    near navel  . Hyperlipidemia   . Hypertension   . Leaky heart valve   . Neuropathy   . Rheumatic fever   . Slow urinary stream   . TIA (transient ischemic attack)    Past Surgical History:  Procedure Laterality Date  . CIRCUMCISION    . LITHOTRIPSY      Prior to Admission medications   Medication Sig Start Date End Date Taking? Authorizing Provider  aspirin EC 81 MG tablet Take 81 mg by mouth daily.   Yes Historical Provider, MD  atorvastatin (LIPITOR) 20 MG tablet Take 20 mg by mouth every evening.    Yes Historical Provider, MD  finasteride (PROSCAR) 5 MG tablet Take 5 mg by mouth daily.   Yes Historical Provider, MD  gabapentin (NEURONTIN) 300 MG capsule Take 300 mg by mouth 4 (four) times daily. Takes 2 in the morning and 1 at night   Yes Historical Provider, MD  glipiZIDE (GLUCOTROL XL) 10 MG 24 hr tablet Take 10 mg by mouth daily with breakfast.   Yes Historical Provider, MD  hydrOXYzine (VISTARIL) 50 MG capsule Take 50 mg by mouth 3 (three) times daily.   Yes Historical Provider, MD  metFORMIN (GLUCOPHAGE-XR) 500 MG 24 hr tablet Take 500 mg by mouth 2 (two) times daily.   Yes Historical Provider, MD  montelukast (SINGULAIR) 10 MG tablet Take 10 mg by mouth at bedtime.   Yes Historical Provider, MD  Potassium Chloride ER 20 MEQ TBCR Take 20 mEq by mouth daily. 05/03/15  Yes Historical Provider, MD  tamsulosin (FLOMAX) 0.4 MG CAPS capsule Take 0.4 mg by mouth daily. 05/03/15  Yes Historical Provider, MD  pantoprazole (PROTONIX) 40 MG  tablet Take 1 tablet (40 mg total) by mouth daily. 10/11/13   Henderson Cloud, MD   Allergies  Allergen Reactions  . Cashew Nut Oil Palpitations  . Percocet [Oxycodone-Acetaminophen] Other (See Comments)    Hallucintaions    FAMILY HISTORY:  Family History  Problem Relation Age of Onset  . Emphysema Mother   . Aneurysm Mother   . Emphysema Father   . Coronary artery disease Brother   . Coronary artery disease Brother   . Coronary artery disease Sister    SOCIAL HISTORY:  reports that he has never smoked. He has never used smokeless tobacco. He reports that he does not drink alcohol or use drugs.  REVIEW OF SYSTEMS:  Patient sedated and intubated and unable to provide history or  ROS.   INTERVAL HISTORY:   VITAL SIGNS: Temp:  [97.8 F (36.6 C)-98 F (36.7 C)] 97.8 F (36.6 C) (04/29 0342) Pulse Rate:  [78-161] 81 (04/29 0700) Resp:  [13-35] 28 (04/29 0700) BP: (87-178)/(65-121) 98/67 (04/29 0700) SpO2:  [84 %-100 %] 99 % (04/29 0700) FiO2 (%):  [50 %-100 %] 50 % (04/29 0700) Weight:  [93 kg (205 lb)-109.2 kg (240 lb 11.9 oz)] 109.2 kg (240 lb 11.9 oz) (04/29 0158) HEMODYNAMICS:   VENTILATOR SETTINGS: Vent Mode: PRVC FiO2 (%):  [50 %-100 %] 50 % Set Rate:  [20 bmp-30 bmp] 28 bmp Vt Set:  [480 mL-620 mL] 480 mL PEEP:  [8 cmH20-12 cmH20] 10 cmH20 Plateau Pressure:  [21 cmH20-27 cmH20] 21 cmH20 INTAKE / OUTPUT: Intake/Output      04/28 0701 - 04/29 0700 04/29 0701 - 04/30 0700   I.V. (mL/kg) 1466.9 (13.4)    Total Intake(mL/kg) 1466.9 (13.4)    Urine (mL/kg/hr) 500 50 (0.4)   Total Output 500 50   Net +966.9 -50         PHYSICAL EXAMINATION: General:  Ill appearing elderly gentleman Neuro:  Sedated, wakens to voice but will not follow commands; PERRL HEENT:  ETT in place Cardiovascular:  Distant heart sounds, RRR, no murmurs, rubs or gallops appreciated, pulses intact, 3+ pitting LE edema Lungs:  Bilateral rales, distant breath sounds Abdomen:  Obese, distended, soft, nontender Musculoskeletal:  Unable to fully examine due to sedation Skin:  intact  LABS: Cbc  Recent Labs Lab 10/27/16 2012  10/27/16 2022 10/28/16 0235  WBC  --   < > 16.4* 13.1*  HGB 13.9  --  12.9* 10.6*  HCT 41.0  --  40.5 33.7*  PLT  --   --  324 256  < > = values in this interval not displayed.  Chemistry   Recent Labs Lab 10/27/16 2012 10/27/16 2022 10/28/16 0235  NA 140 136 140  K 4.3 4.4 4.5  CL 106 103 106  CO2  --  21* 24  BUN CREATININE 1.00 1.16 1.19  CALCIUM  --  8.3* 7.7*  GLUCOSE 255* 252* 207*    Liver fxn  Recent Labs Lab 10/27/16 2022  AST 30  ALT 34  ALKPHOS 160*  BILITOT 0.5  PROT 7.9  ALBUMIN 3.6   coags No  results for input(s): APTT, INR in the last 168 hours. Sepsis markers  Recent Labs Lab 10/27/16 2028 10/27/16 2335 10/28/16 0156  LATICACIDVEN 4.02* 2.01* 1.9   Cardiac markers No results for input(s): CKTOTAL, CKMB, TROPONINI in the last 168 hours. BNP No results for input(s): PROBNP in the last 168 hours. ABG  Recent Labs Lab 10/27/16  2012 10/27/16 2034 10/28/16 0313  PHART  --  7.258* 7.420  PCO2ART  --  52.5* 37.3  PO2ART  --  60.0* 139.0*  HCO3  --  23.4 24.3  TCO2 CBG trend  Recent Labs Lab 10/27/16 2005 10/28/16 0201  GLUCAP 237* 241*     DIAGNOSES: Active Problems:   Acute respiratory failure (HCC)   ASSESSMENT / PLAN:  PULMONARY A: Acute hypoxic/hypercarbic respiratory failure from CHF or PNA +/- ARDS P:   High vent support at the moment Will wean per ARDSNet protocols and use VAP protection f/u AM CXR  CARDIOVASCULAR A:  Suspect HFpEF in exacerbation LBBB, suspect chronic P:  Continue diuresis with Lasix  BID Trend troponins f/u Echo ASA, atorvastatin  RENAL A:   Mild inc in creatinine from baseline 1.2 (from 0.9) likely from shock Lactic acidosis resolved Has suspected BPH as on finesteride and tamsulosin - holding these in setting of sepsis; has foley in place P:   Follow Bmets  GASTROINTESTINAL A:   Fatty liver disease, severity unknown P:   NPO except meds OG tube Protonix  HEMATOLOGIC A:   Leukocytosis may be from sepsis P:  lovenox for DVT prophylaxis  INFECTIOUS A:   Sepsis from CAP P:   Ceftriaxone and Azithromycin Follow blood cultures Lower respiratory cultures  ENDOCRINE A:   DMII, poorly controlled On oral agents at home P:   SSI CBGs Likely start TF today  NEUROLOGIC A:   Sedation for mechanical ventilation P:   RASS goal: 0 Propofol Hold home gabapentin PRN fentanyl and versed   I have personally obtained a history, examined the patient, evaluated laboratory  and imaging results, formulated the assessment and plan and placed orders. CRITICAL CARE: The patient is critically ill with multiple organ systems failure and requires high complexity decision making for assessment and support, frequent evaluation and titration of therapies, application of advanced monitoring technologies and extensive interpretation of multiple databases. Critical Care Time devoted to patient care services described in this note is 35 minutes.   Nyra Market, MD PCCM - PGY1 Pulmonary and Critical Care Medicine [facility] Pager: [phone]  10/28/2016, 8:11 AM

## 2016-10-28 NOTE — Progress Notes (Addendum)
PHARMACY - PHYSICIAN COMMUNICATION CRITICAL VALUE ALERT - BLOOD CULTURE IDENTIFICATION (BCID)  Results for orders placed or performed during the hospital encounter of 10/27/16  Blood Culture ID Panel (Reflexed) (Collected: 10/27/2016  8:25 PM)  Result Value Ref Range   Enterococcus species NOT DETECTED NOT DETECTED   Listeria monocytogenes NOT DETECTED NOT DETECTED   Staphylococcus species NOT DETECTED NOT DETECTED   Staphylococcus aureus NOT DETECTED NOT DETECTED   Streptococcus species NOT DETECTED NOT DETECTED   Streptococcus agalactiae NOT DETECTED NOT DETECTED   Streptococcus pneumoniae NOT DETECTED NOT DETECTED   Streptococcus pyogenes NOT DETECTED NOT DETECTED   Acinetobacter baumannii NOT DETECTED NOT DETECTED   Enterobacteriaceae species PENDING NOT DETECTED   Enterobacter cloacae complex NOT DETECTED NOT DETECTED   Escherichia coli DETECTED (A) NOT DETECTED   Klebsiella oxytoca NOT DETECTED NOT DETECTED   Klebsiella pneumoniae NOT DETECTED NOT DETECTED   Proteus species NOT DETECTED NOT DETECTED   Serratia marcescens NOT DETECTED NOT DETECTED   Carbapenem resistance NOT DETECTED NOT DETECTED   Haemophilus influenzae NOT DETECTED NOT DETECTED   Neisseria meningitidis NOT DETECTED NOT DETECTED   Pseudomonas aeruginosa NOT DETECTED NOT DETECTED   Candida albicans NOT DETECTED NOT DETECTED   Candida glabrata NOT DETECTED NOT DETECTED   Candida krusei NOT DETECTED NOT DETECTED   Candida parapsilosis NOT DETECTED NOT DETECTED   Candida tropicalis NOT DETECTED NOT DETECTED    Name of physician (or Provider) Contacted: Dr. Marchelle Gearing  Changes to prescribed antibiotics required: Patient currently on ceftriaxone/azithromycin for CAP. Patient with Ecoli bacteremia, recommend discontinuing azithromycin and increasing ceftriaxone to 2g IV q24h. Discussed with Dr. Marchelle Gearing will increase ceftriaxone to 2g and continue azithromycin.  Casilda Carls, PharmD, BCPS PGY-2 Infectious Diseases  Pharmacy Resident Pager: [phone] 10/28/2016  12:20 PM

## 2016-10-28 NOTE — ED Notes (Signed)
Unable to give report

## 2016-10-28 NOTE — ED Notes (Signed)
Report attempted 10 minutes ago  rn will call me back

## 2016-10-29 ENCOUNTER — Inpatient Hospital Stay (HOSPITAL_COMMUNITY): Payer: Medicare Other

## 2016-10-29 DIAGNOSIS — K760 Fatty (change of) liver, not elsewhere classified: Secondary | ICD-10-CM

## 2016-10-29 DIAGNOSIS — R7989 Other specified abnormal findings of blood chemistry: Secondary | ICD-10-CM

## 2016-10-29 DIAGNOSIS — R7881 Bacteremia: Secondary | ICD-10-CM

## 2016-10-29 DIAGNOSIS — N400 Enlarged prostate without lower urinary tract symptoms: Secondary | ICD-10-CM

## 2016-10-29 DIAGNOSIS — N179 Acute kidney failure, unspecified: Secondary | ICD-10-CM

## 2016-10-29 DIAGNOSIS — I509 Heart failure, unspecified: Secondary | ICD-10-CM

## 2016-10-29 DIAGNOSIS — J810 Acute pulmonary edema: Secondary | ICD-10-CM

## 2016-10-29 DIAGNOSIS — I5030 Unspecified diastolic (congestive) heart failure: Secondary | ICD-10-CM

## 2016-10-29 DIAGNOSIS — R778 Other specified abnormalities of plasma proteins: Secondary | ICD-10-CM

## 2016-10-29 DIAGNOSIS — B9620 Unspecified Escherichia coli [E. coli] as the cause of diseases classified elsewhere: Secondary | ICD-10-CM

## 2016-10-29 DIAGNOSIS — A419 Sepsis, unspecified organism: Secondary | ICD-10-CM

## 2016-10-29 LAB — BASIC METABOLIC PANEL
ANION GAP: 10 (ref 5–15)
ANION GAP: 8 (ref 5–15)
BUN: 12 mg/dL (ref 6–20)
BUN: 16 mg/dL (ref 6–20)
CHLORIDE: 102 mmol/L (ref 101–111)
CO2: 28 mmol/L (ref 22–32)
CO2: 29 mmol/L (ref 22–32)
Calcium: 7.7 mg/dL — ABNORMAL LOW (ref 8.9–10.3)
Calcium: 8 mg/dL — ABNORMAL LOW (ref 8.9–10.3)
Chloride: 105 mmol/L (ref 101–111)
Creatinine, Ser: 1.3 mg/dL — ABNORMAL HIGH (ref 0.61–1.24)
Creatinine, Ser: 1.39 mg/dL — ABNORMAL HIGH (ref 0.61–1.24)
GFR, EST AFRICAN AMERICAN: 59 mL/min — AB (ref >60)
GFR, EST NON AFRICAN AMERICAN: 56 mL/min — AB (ref >60)
GFR, EST NON AFRICAN AMERICAN: 51 mL/min — AB (ref >60)
GLUCOSE: 180 mg/dL — AB (ref 65–99)
Glucose, Bld: 114 mg/dL — ABNORMAL HIGH (ref 65–99)
POTASSIUM: 3.1 mmol/L — AB (ref 3.5–5.1)
POTASSIUM: 3.7 mmol/L (ref 3.5–5.1)
SODIUM: 140 mmol/L (ref 135–145)
Sodium: 142 mmol/L (ref 135–145)

## 2016-10-29 LAB — GLUCOSE, CAPILLARY
GLUCOSE-CAPILLARY: 115 mg/dL — AB (ref 65–99)
GLUCOSE-CAPILLARY: 113 mg/dL — AB (ref 65–99)
Glucose-Capillary: 102 mg/dL — ABNORMAL HIGH (ref 65–99)
Glucose-Capillary: 128 mg/dL — ABNORMAL HIGH (ref 65–99)
Glucose-Capillary: 148 mg/dL — ABNORMAL HIGH (ref 65–99)
Glucose-Capillary: 171 mg/dL — ABNORMAL HIGH (ref 65–99)

## 2016-10-29 LAB — CBC
HCT: 31.9 % — ABNORMAL LOW (ref 39.0–52.0)
Hemoglobin: 10.1 g/dL — ABNORMAL LOW (ref 13.0–17.0)
MCH: 27.3 pg (ref 26.0–34.0)
MCHC: 31.7 g/dL (ref 30.0–36.0)
MCV: 86.2 fL (ref 78.0–100.0)
Platelets: 250 10*3/uL (ref 150–400)
RBC: 3.7 MIL/uL — ABNORMAL LOW (ref 4.22–5.81)
RDW: 14.6 % (ref 11.5–15.5)
WBC: 7.8 10*3/uL (ref 4.0–10.5)

## 2016-10-29 LAB — HEMOGLOBIN A1C
HEMOGLOBIN A1C: 10.1 % — AB (ref 4.8–5.6)
MEAN PLASMA GLUCOSE: 243 mg/dL

## 2016-10-29 LAB — ECHOCARDIOGRAM COMPLETE
HEIGHTINCHES: 73 in
Weight: 3760.17 oz

## 2016-10-29 LAB — MAGNESIUM: Magnesium: 2.1 mg/dL (ref 1.7–2.4)

## 2016-10-29 LAB — PHOSPHORUS: Phosphorus: 3 mg/dL (ref 2.5–4.6)

## 2016-10-29 LAB — PROCALCITONIN: PROCALCITONIN: 0.85 ng/mL

## 2016-10-29 MED ORDER — FUROSEMIDE 10 MG/ML IJ SOLN
40.0000 mg | Freq: Two times a day (BID) | INTRAMUSCULAR | Status: DC
  Administered 2016-10-29 – 2016-10-30 (×3): 40 mg via INTRAVENOUS
  Filled 2016-10-29 (×2): qty 4

## 2016-10-29 MED ORDER — PRO-STAT SUGAR FREE PO LIQD
30.0000 mL | Freq: Two times a day (BID) | ORAL | Status: DC
  Administered 2016-10-29: 30 mL
  Filled 2016-10-29 (×2): qty 30

## 2016-10-29 MED ORDER — POTASSIUM CHLORIDE 20 MEQ/15ML (10%) PO SOLN
40.0000 meq | Freq: Once | ORAL | Status: AC
  Administered 2016-10-29: 40 meq via ORAL
  Filled 2016-10-29: qty 30

## 2016-10-29 MED ORDER — VITAL HIGH PROTEIN PO LIQD
1000.0000 mL | ORAL | Status: DC
  Administered 2016-10-29 – 2016-10-30 (×3): 1000 mL
  Filled 2016-10-29: qty 1000

## 2016-10-29 MED ORDER — PRO-STAT SUGAR FREE PO LIQD
60.0000 mL | Freq: Every day | ORAL | Status: DC
  Administered 2016-10-29 – 2016-10-30 (×4): 60 mL
  Filled 2016-10-29 (×8): qty 60

## 2016-10-29 MED ORDER — VITAL HIGH PROTEIN PO LIQD
1000.0000 mL | ORAL | Status: DC
  Administered 2016-10-29: 1000 mL

## 2016-10-29 NOTE — Care Management Note (Signed)
Case Management Note  Patient Details  Name: [name] MRN: [id number] Date of Birth: 1950/11/22  Subjective/Objective:    Pt admitted with ARF - ventilated on sedation                Action/Plan:  PTA from home with wife.     Expected Discharge Date:                  Expected Discharge Plan:     In-House Referral:     Discharge planning Services  CM Consult  Post Acute Care Choice:    Choice offered to:     DME Arranged:    DME Agency:     HH Arranged:    HH Agency:     Status of Service:     If discussed at [employer], dates discussed:    Additional Comments:  Cherylann Parr, RN 10/29/2016, 3:40 PM

## 2016-10-29 NOTE — Progress Notes (Signed)
PULMONARY  / CRITICAL CARE MEDICINE  Name: [name] MRN: [id number] DOB: [birth year]    LOS: 2  REFERRING MD :  Dr Anitra Lauth  CHIEF COMPLAINT:  Acute respiratory failure  BRIEF PATIENT DESCRIPTION:  [name] is a 66 y.o. male with fatty liver disease, CAD, DM, HTN, HLD, anxiety who presented with acute respiratory failure to the [facility] ED  His wife says [name] is chronically short of breath on exertion but was able to walk around the grocery store without any problems yesterday. Today, he was much more out of breath at rest than usual despite not having fevers, chills, a cough. He had worse swelling in his legs and a rash from fleas, which he is taking hydroxyzine for. EMS was called and found him in extremis complaining of chest pain, shortness of breath. He was found to have a wide complex tachycardia and due to signs of poor perfusion was given a dose of synchronized cardioversion to no effect. He also was given a full aspirin and a sublingual nitroglycerin. On arrival to the ED, his oxygen saturations remained critically low despite NRB and BiPAP so the decision was made to intubate him.  SIGNIFICANT EVENTS:  4/28 - acute respiratory failure requiring intubation; admission to ICU, getting abx and diuresis  4/30 CXR - significant improvement in bil infiltrates ECHO: ER reduced to 45-50% doppler parameters c/w elevated end-diastolic filling pressures 5/1 weaning on PSV. Stopped weaning efforts d/t anxiety and WOB.   LINES / TUBES: PIV 4/28 ETT 4/28  CULTURES: BCx 4/28>> BCx 4/28>> E coli RVP 4/29: neg U strept 4/28: neg U legionella 4/28>>>  ANTIBIOTICS: Azithromyin 4/28>>> Ceftriaxone 4/28>>  INTERVAL HISTORY/subjective  Following commands.   VITAL SIGNS: Temp:  [98.4 F (36.9 C)-99.9 F (37.7 C)] 98.9 F (37.2 C) (04/30 1501) Pulse Rate:  [59-101] 66 (04/30 1400) Resp:  [17-35] 28 (04/30 1400) BP: (101-132)/(48-97) 117/78 (04/30 1400) SpO2:  [97 %-100  %] 100 % (04/30 1400) FiO2 (%):  [40 %] 40 % (04/30 1225) Weight:  [235 lb 0.2 oz (106.6 kg)] 235 lb 0.2 oz (106.6 kg) (04/30 0409)   HEMODYNAMICS:   VENTILATOR SETTINGS: Vent Mode: PRVC FiO2 (%):  [40 %] 40 % Set Rate:  [28 bmp] 28 bmp Vt Set:  [480 mL] 480 mL PEEP:  [8 cmH20] 8 cmH20 Plateau Pressure:  [18 cmH20-25 cmH20] 19 cmH20 INTAKE / OUTPUT:  Intake/Output Summary (Last 24 hours) at 10/29/16 1509 Last data filed at 10/29/16 1500  Gross per 24 hour  Intake          1211.87 ml  Output             4400 ml  Net         -3188.13 ml    PHYSICAL EXAMINATION:  General appearance:  66 Year old  male, obese,  Follows commands  Eyes: anicteric sclerae, moist conjunctivae; PERRL, EOMI bilaterally. Mouth:  Orally intubated. membranes and no mucosal ulcerations; normal hard and soft palate Neck: Trachea midline; neck supple, no JVD Lungs/chest: CTA, with normal respiratory effort and no intercostal retractions CV: RRR, no MRGs  Abdomen: Soft, distended non-tender; no masses or HSM Extremities: + LE edema Skin: Normal temperature, turgor and texture; no rash, ulcers or subcutaneous nodules Neuro/ Psych: Anxious affect, alert and oriented to person, place and time   LABS: Cbc  Recent Labs Lab 10/28/16 0235 10/28/16 1010 10/29/16 0224  WBC 13.1* 9.5 7.8  HGB 10.6* 10.3* 10.1*  HCT 33.7* 31.6*  31.9*  PLT 256 231 250    Chemistry   Recent Labs Lab 10/28/16 0725 10/28/16 1010 10/29/16 0224 10/29/16 1147  NA  --  139 140 142  K  --  3.6 3.1* 3.7  CL  --  104 102 105  CO2  --  BUN  --  CREATININE  --  1.17 1.30* 1.39*  CALCIUM  --  7.6* 7.7* 8.0*  MG 1.5*  --  2.1  --   PHOS 3.5  --  3.0  --   GLUCOSE  --  153* 114* 180*    Liver fxn  Recent Labs Lab 10/27/16 2022  AST 30  ALT 34  ALKPHOS 160*  BILITOT 0.5  PROT 7.9  ALBUMIN 3.6   coags No results for input(s): APTT, INR in the last 168 hours. Sepsis markers  Recent  Labs Lab 10/27/16 2028 10/27/16 2335 10/28/16 0156 10/28/16 1314 10/29/16 0224  LATICACIDVEN 4.02* 2.01* 1.9  --   --   PROCALCITON  --   --   --  0.81 0.85   Cardiac markers  Recent Labs Lab 10/28/16 0725 10/28/16 1314 10/28/16 1858  TROPONINI 0.20* 0.19* 0.17*   BNP No results for input(s): PROBNP in the last 168 hours. ABG  Recent Labs Lab 10/27/16 2034 10/28/16 0313 10/28/16 0911  PHART 7.258* 7.420 7.358  PCO2ART 52.5* 37.3 46.0  PO2ART 60.0* 139.0* 30.0*  HCO3 23.4 24.3 26.0  TCO2 CBG trend  Recent Labs Lab 10/28/16 2346 10/29/16 0344 10/29/16 0804 10/29/16 1134 10/29/16 1459  GLUCAP 147* 102* 115* 171* 128*    ASSESSMENT / PLAN: Active Problems:  Acute hypoxic and hypercarbic respiratory failure (HCC) in setting of bilateral pulmonary infiltrates. Presume CAP + vs aspiration w/  pulmonary edema +/-ALI -attempted weaning for first time today  -CXR w/out much change. R>L airspace disease which likely represents mix of pulmonary edema and Right sided PNA +/- ALI (personally reviewed) -now 5 liters neg Plan Cont daily weaning attempt Change propofol to precedex Cont lasix, but decrease to daily given rising creatinine  Chest [place] to r/o effusion Will need objective swallow study after extubated  Sepsis (HCC) Presumed CAP vs aspiration  Bacteremia due to Escherichia coli Plan Day 4/5 azithro Day 4/8 rocephin assuming f/u BC cleared   (HFpEF) heart failure with preserved ejection fraction (HCC), pulmonary edema and LBBB of unknown chronicity  HTN Plan Cont lasix (40 qd) lipitor 20 qd Asa 81 qd Add low dose BB (coreg 6.25 bid) PRN labetolol    Elevated troponin I level, presume demand ischemia  Plan Cont tele  AKI (acute kidney injury) (HCC)-->suspect a little worse w/ diuretics currently  Plan Decrease lasix Control BP Renal dose meds  BPH (benign prostatic hyperplasia) Plan Currently has foley Resume proscar and  flomax when extubated  Fatty liver.  -.abd [place] 4/30: c/w fatty liver, no gallstones, 6mm polyp, no cholecystitis  Plan Consider out-pt GI eval (defer to PCP) [place] abd at bedside to eval for ascites  Diabetes mellitus (HCC) Plan ssi   DVT prophylaxis: LMWH SUP: PPI Diet: tubefeed Activity: BR Disposition : ICU weaning   Discussion Slowly improving. CXR unchanged. Did not tolerate weaning well. ? Anxiety vs still the effect of PNA and edema.  For today: change prop to precedex.  Cont lasix at decreased dosing d/t rising creatinine Evaluate right chest to r/o effusion Evaluate abd to r/o ascites.  My critical care time 45 minutes,.    Simonne Martinet ACNP-BC [facility] Pulmonary/Critical Care Pager # [phone] OR # [phone] if no answer  10/29/2016, 3:04 PM    ATTENDING NOTE / ATTESTATION NOTE :   I have discussed the case with the resident/APP  Anders Simmonds NP.   I agree with the resident/APP's  history, physical examination, assessment, and plans.    I have edited the above note and modified it according to our agreed history, physical examination, assessment and plan.   Briefly, pt with chronic shortness of breath on exertion. He presented with acute SOB x 1 day with swelling of legs and a rash from fleas. EMS was called and found him in extremis complaining of chest pain, shortness of breath. He was found to have a wide complex tachycardia and due to signs of poor perfusion was given a dose of synchronized cardioversion to no effect. He also was given a full aspirin and a sublingual nitroglycerin. On arrival to the ED, his oxygen saturations remained critically low despite NRB and BiPAP so the decision was made to intubate him.  Has Ecoli bacteremia.  Remains on the vent.   (-) issues overnight.   Vitals:  Vitals:   10/30/16 1111 10/30/16 1200 10/30/16 1205 10/30/16 1300  BP: 131/87 (!) 142/85  (!) 159/90  Pulse: 86 81  89  Resp: (!) Temp:   100 F (37.8  C)   TempSrc:   Oral   SpO2: 100% 100%  100%  Weight:      Height:        Constitutional/General: well-nourished, well-developed, intubated, awake, follows commands, not in any distress  Body mass index is 29.93 kg/m. Wt Readings from Last 3 Encounters:  10/30/16 102.9 kg (226 lb 13.7 oz)  10/10/13 95.2 kg (209 lb 14.1 oz)  04/12/13 86.2 kg (190 lb)    HEENT: PERLA, anicteric sclerae. (-) Oral thrush. Intubated, ETT in place  Neck: No masses. Midline trachea. No JVD, (-) LAD. (-) bruits appreciated.  Respiratory/Chest: Grossly normal chest. (-) deformity. (-) Accessory muscle use.  Symmetric expansion. Diminished BS on both lower lung zones. (-) wheezing,rhonchi Crackles at bases.  (-) egophony  Cardiovascular: Regular rate and  rhythm, heart sounds normal, no murmur or gallops,  Trace peripheral edema  Gastrointestinal:  Normal bowel sounds. Soft, non-tender. No hepatosplenomegaly.  (-) masses.   Musculoskeletal:  Normal muscle tone.   Extremities: Grossly normal. (-) clubbing, cyanosis.  (-) edema  Skin: (-) rash,lesions seen.   Neurological/Psychiatric : awake, intubated. CN grossly intact. (-) lateralizing signs.   CBC Recent Labs     10/28/16  1010  10/29/16  0224  10/30/16  0246  WBC  9.5  7.8  7.4  HGB  10.3*  10.1*  10.0*  HCT  31.6*  31.9*  31.9*  PLT  231  250  251    Coag's No results for input(s): APTT, INR in the last 72 hours.  BMET Recent Labs     10/29/16  0224  10/29/16  1147  10/30/16  0246  NA  140  142  139  K  3.1*  3.7  3.5  CL  102  105  101  CO2  BUN  12  16  22*  CREATININE  1.30*  1.39*  1.35*  GLUCOSE  114*  180*  120*    Electrolytes Recent Labs     10/28/16  0725   10/29/16  0224  10/29/16  1147  10/30/16  0246  CALCIUM   --    < >  7.7*  8.0*  7.8*  MG  1.5*   --   2.1   --   2.1  PHOS  3.5   --   3.0   --   4.0   < > = values in this interval not displayed.    Sepsis Markers Recent Labs      10/28/16  1314  10/29/16  0224  10/30/16  0246  PROCALCITON  0.81  0.85  0.52    ABG Recent Labs     10/27/16  2034  10/28/16  0313  10/28/16  0911  PHART  7.258*  7.420  7.358  PCO2ART  52.5*  37.3  46.0  PO2ART  60.0*  139.0*  30.0*    Liver Enzymes Recent Labs     10/27/16  2022  AST  30  ALT  34  ALKPHOS  160*  BILITOT  0.5  ALBUMIN  3.6    Cardiac Enzymes Recent Labs     10/28/16  0725  10/28/16  1314  10/28/16  1858  TROPONINI  0.20*  0.19*  0.17*    Glucose Recent Labs     10/29/16  1459  10/29/16  1954  10/29/16  2352  10/30/16  0408  10/30/16  0817  10/30/16  1202  GLUCAP  128*  113*  148*  129*  144*  159*    Imaging US Abdomen Complete  Result Date: 10/30/2016 CLINICAL DATA:  Abdominal pain. EXAM: ABDOMEN ULTRASOUND COMPLETE COMPARISON:  [facility] 04/30/ 2018.  Ultrasound 01/19/2008. FINDINGS: Gallbladder: No gallstones. 6 mm gallbladder polyp noted on today's exam. This previously measured 5 mm on prior exam. A new 3 mm polyp noted on today's exam . Gallbladder wall thickness 1.3 mm. Negative Murphy sign. Common bile duct: Diameter: 3 mm. Liver: Increased echogenicity of the liver again noted consistent with fatty infiltration. No focal hepatic abnormality. IVC: No abnormality visualized. Pancreas: Visualized portion unremarkable. Spleen: Size and appearance within normal limits. Right Kidney: Length: 11.7 cm. Echogenicity within normal limits. No mass or hydronephrosis visualized. Left Kidney: Length: 0.7 cm. Echogenicity within normal limits. No mass or hydronephrosis visualized. Abdominal aorta: Mild ectasia at 2.5 cm. Other findings: None. IMPRESSION: 1. No gallstones. 6 mm gallbladder polyp noted on today's exam. This previously measured 5 mm on prior ultrasound of 01/19/2008. A new 3 mm polyp noted on today's exam. No evidence of cholecystitis. No biliary distention. 2. Increased echogenicity of the liver again noted consistent with fatty  infiltration. 3. Mild abdominal aortic ectasia 2.5 cm. Ectatic abdominal aorta at risk for aneurysm development. Recommend followup by ultrasound in 5 years. This recommendation follows ACR consensus guidelines: White Paper of the ACR Incidental Findings Committee II on Vascular Findings. J Am Coll Radiol 2013; 10:789-794. Electronically Signed   By: Maisie Fus  Register   On: 10/30/2016 06:31   Dg Chest Port 1 View  Result Date: 10/30/2016 CLINICAL DATA:  Respiratory failure, shortness of breath, intubated patient. EXAM: PORTABLE CHEST 1 VIEW COMPARISON:  Portable chest x-ray of October 29, 2016 FINDINGS: The lungs are reasonably well inflated. The patient's hand overlies the right apex. The interstitial markings remain coarse. Bibasilar densities are stable. There is no large pleural effusion and no visible pneumothorax. The cardiac silhouette remains enlarged. The pulmonary vascularity is not engorged. The endotracheal tube tip lies approximately 2.8 cm above the  carina. The esophagogastric tube tip projects below the inferior margin of the image. IMPRESSION: Allowing for differences in positioning there has not been significant interval change since a previous study. There is bibasilar atelectasis or pneumonia. There is stable cardiomegaly without significant pulmonary vascular congestion. The support tubes are in reasonable position. Electronically Signed   By: David  [place] M.D.   On: 10/30/2016 07:11   Dg Chest Port 1 View  Result Date: 10/29/2016 CLINICAL DATA:  Respiratory failure EXAM: PORTABLE CHEST 1 VIEW COMPARISON:  10/27/2016 FINDINGS: Cardiac shadow is enlarged. Endotracheal tube and nasogastric catheter noted in satisfactory position. Significant interval clearing is noted in the right lung although persistent right basilar infiltrate is seen. Mild left basilar infiltrate is noted as well. No bony abnormality is seen. IMPRESSION: Significant improved aeration in the right lung although persistent  bibasilar infiltrates are seen. Electronically Signed   By: Alcide Clever M.D.   On: 10/29/2016 07:03   Dg Abd Portable 1v  Result Date: 10/29/2016 CLINICAL DATA:  Confirm orogastric tube placement EXAM: PORTABLE ABDOMEN - 1 VIEW COMPARISON:  Yesterday FINDINGS: An orogastric tube tip is at the descending duodenum. Nonobstructive bowel gas pattern. Lung opacities, dedicated chest x-ray from earlier today. IMPRESSION: Orogastric tube tip at the descending duodenum. Electronically Signed   By: Marnee Spring M.D.   On: 10/29/2016 15:29    Assessment/Plan : Acute Hypoxemic respiratory failure 2/2 CAP + Asp PNA + Pulm edema + demand ischemia - improved - cont vent support. PST when able - cont Rocephin + Azithromycin pending cultures - cont diuresis (decrease today 2/2 elevated creatinine) - will need swallow evaln later on   Acute CHFpEF exacerbation + Moderate AI - cont diuresis - BB as ordered   Fatty Liver - observe LFTs.  - Abd [place] U/R   DM - cont SS insulin     I spent  30   minutes of Critical Care time with this patient today. This is my time spent independent of the APP or resident.   Family :Family updated today.    Pollie Meyer, MD 10/30/2016, 1:25 PM [place] [facility] Pager [phone] After 3 pm or if no answer, call [phone]

## 2016-10-29 NOTE — Progress Notes (Signed)
Initial Nutrition Assessment  DOCUMENTATION CODES:   Obesity unspecified  INTERVENTION:    Vital High Protein at 10 ml/h (240 ml per day)  Pro-stat 60 ml 5 times per day  Provides 1240 kcal, 171 gm protein, 201 ml free water daily  Total calorie intake with Propofol and TF will be 1977 kcal (providing > estimated needs to ensure adequate protein provision)  NUTRITION DIAGNOSIS:   Inadequate oral intake related to inability to eat as evidenced by NPO status.  GOAL:   Provide needs based on ASPEN/SCCM guidelines  MONITOR:   Vent status, TF tolerance, Labs, I & O's  REASON FOR ASSESSMENT:   Consult Enteral/tube feeding initiation and management  ASSESSMENT:   66 y.o. male with fatty liver disease, CAD, DM, HTN, HLD, anxiety who presented to the [facility] ED with acute respiratory failure. Required intubation on admission.   Discussed patient in ICU rounds and with RN today. Received MD Consult for TF initiation and management. Patient is currently intubated on ventilator support MV: 13.4 L/min Temp (24hrs), Avg:98.8 F (37.1 C), Min:98.4 F (36.9 C), Max:99.9 F (37.7 C)  Propofol: 27.9 ml/hr providing 737 kcal from lipid  Labs reviewed: potassium 3.1 (L) Medications reviewed and include Propofol and Colace.   Diet Order:  Diet NPO time specified Except for: Sips with Meds  Skin:  Reviewed, no issues  Last BM:  Unknown  Height:   Ht Readings from Last 1 Encounters:  10/27/16  (1.854 m)    Weight:   Wt Readings from Last 1 Encounters:  10/29/16 235 lb 0.2 oz (106.6 kg)   Patient is very edematous, suspect actual weight is much lower, which would also make BMI lower.   Ideal Body Weight:  83.6 kg  BMI:  Body mass index is 31.01 kg/m.  Estimated Nutritional Needs:   Kcal:  1175-1500  Protein:  167 gm  Fluid:  2 L  EDUCATION NEEDS:   No education needs identified at this time  Joaquin Courts, RD, LDN, CNSC Pager [phone] After  Hours Pager [phone]

## 2016-10-29 NOTE — Progress Notes (Addendum)
PULMONARY  / CRITICAL CARE MEDICINE  Name: [name] MRN: [id number] DOB: 1951/01/31    LOS: 2  REFERRING MD :  Dr Anitra Lauth  CHIEF COMPLAINT:  Acute respiratory failure  BRIEF PATIENT DESCRIPTION: [name] is a 66 y.o. male with fatty liver disease, CAD, DM, HTN, HLD, anxiety who presented with acute respiratory failure to the [facility] ED.  His wife says Mr Cosens is chronically short of breath on exertion but was able to walk around the grocery store without any problems yesterday. Today, he was much more out of breath at rest than usual despite not having fevers, chills, a cough. He had worse swelling in his legs and a rash from fleas, which he is taking hydroxyzine for. EMS was called and found him in extremis complaining of chest pain, shortness of breath. He was found to have a wide complex tachycardia and due to signs of poor perfusion was given a dose of synchronized cardioversion to no effect. He also was given a full aspirin and a sublingual nitroglycerin. On arrival to the ED, his oxygen saturations remained critically low despite NRB and BiPAP so the decision was made to intubate him.  LINES / TUBES: PIV 4/28 ETT 4/28  CULTURES: BCx 4/28>> BCx 4/28>> E coli  ANTIBIOTICS: Azithromyin 4/28 Ceftriaxone 4/28   SIGNIFICANT EVENTS:  4/28 - acute respiratory failure requiring intubation; admission to ICU 4/30 CXR - significant improvement in bil infiltrates INTERVAL HISTORY:    SUBJECTIVE : No issues overnight.     VITAL SIGNS: Temp:  [97.7 F (36.5 C)-99.9 F (37.7 C)] 98.6 F (37 C) (04/30 0346) Pulse Rate:  [59-101] 63 (04/30 0700) Resp:  [19-35] 28 (04/30 0700) BP: (93-130)/(48-100) 105/75 (04/30 0700) SpO2:  [92 %-100 %] 100 % (04/30 0700) FiO2 (%):  [40 %] 40 % (04/30 0400) Weight:  [106.6 kg (235 lb 0.2 oz)] 106.6 kg (235 lb 0.2 oz) (04/30 0409) HEMODYNAMICS:   VENTILATOR SETTINGS: Vent Mode: PRVC FiO2 (%):  [40 %] 40 % Set Rate:  [28 bmp]  28 bmp Vt Set:  [480 mL] 480 mL PEEP:  [8 cmH20-10 cmH20] 8 cmH20 Plateau Pressure:  [18 cmH20-22 cmH20] 22 cmH20 INTAKE / OUTPUT: Intake/Output      04/29 0701 - 04/30 0700 04/30 0701 - 05/01 0700   I.V. (mL/kg) 661 (6.2)    NG/GT 60    IV Piggyback 400    Total Intake(mL/kg) 1121 (10.5)    Urine (mL/kg/hr) 4450 (1.7)    Total Output 4450     Net -3329.1           PHYSICAL EXAMINATION: General:  Ill appearing elderly gentleman Neuro:  Sedated. NAD.  CN grossly intact.  HEENT:  ETT in place but apparatus displaced sideways Cardiovascular:  Distant heart sounds, RRR, no murmurs, rubs or gallops appreciated, pulses intact, 2+ pitting LE edema R>L Lungs:  Distant and coarse breath sounds bilaterally Abdomen:  Obese, distended, soft, ascites. Patient indicates pain with palpation throughout. Hepatomegaly. Musculoskeletal:  Moves all extremities freely, strength appears intact Skin:  Lower extremity excoriations, consolidation of erythema on R post lower leg  LABS: Cbc  Recent Labs Lab 10/28/16 0235 10/28/16 1010 10/29/16 0224  WBC 13.1* 9.5 7.8  HGB 10.6* 10.3* 10.1*  HCT 33.7* 31.6* 31.9*  PLT 256 231 250    Chemistry   Recent Labs Lab 10/28/16 0235 10/28/16 0725 10/28/16 1010 10/29/16 0224  NA 140  --  139 140  K 4.5  --  3.6  3.1*  CL 106  --  104 102  CO2 24  --  24 28  BUN 15  --  14 12  CREATININE 1.19  --  1.17 1.30*  CALCIUM 7.7*  --  7.6* 7.7*  MG  --  1.5*  --  2.1  PHOS  --  3.5  --  3.0  GLUCOSE 207*  --  153* 114*    Liver fxn  Recent Labs Lab 10/27/16 2022  AST 30  ALT 34  ALKPHOS 160*  BILITOT 0.5  PROT 7.9  ALBUMIN 3.6   coags No results for input(s): APTT, INR in the last 168 hours. Sepsis markers  Recent Labs Lab 10/27/16 2028 10/27/16 2335 10/28/16 0156 10/28/16 1314 10/29/16 0224  LATICACIDVEN 4.02* 2.01* 1.9  --   --   PROCALCITON  --   --   --  0.81 0.85   Cardiac markers  Recent Labs Lab 10/28/16 0725  10/28/16 1314 10/28/16 1858  TROPONINI 0.20* 0.19* 0.17*   BNP No results for input(s): PROBNP in the last 168 hours. ABG  Recent Labs Lab 10/27/16 2034 10/28/16 0313 10/28/16 0911  PHART 7.258* 7.420 7.358  PCO2ART 52.5* 37.3 46.0  PO2ART 60.0* 139.0* 30.0*  HCO3 23.4 24.3 26.0  TCO2 25 25 27     CBG trend  Recent Labs Lab 10/28/16 0846 10/28/16 1211 10/28/16 1553 10/28/16 2005 10/28/16 2346  GLUCAP 166* 140* 101* 98 147*     DIAGNOSES: Active Problems:   Acute respiratory failure (HCC)   ASSESSMENT / PLAN:  PULMONARY A: Acute hypoxic/hypercarbic respiratory failure from CHF +/- PNA (Ecoli in blood) Significant improvement in infiltrates on CXR P:   Will wean per [employer] protocols and use VAP protection Cont diuresis >> will decrease todays dose f/u AM CXR  CARDIOVASCULAR A:  Suspect HFpEF in exacerbation LBBB, suspect chronic Troponins trended flat - likely demand ischemia Repeat EKG - SR, LBBB, new T wave inversions and biphasic T waves in lateral leads P:  Continue diuresis with Lasix 40mg  BID from TID f/u Echo ASA, atorvastatin  RENAL A:   Mild uptrend in Cr 1.2>1.3 (0.9 b/l); UOP 4.5L in last 24hrs Lactic acidosis resolved Has suspected BPH as on finesteride and tamsulosin - holding these in setting of sepsis; has foley in place P:   Follow Bmets Lasix 40mg  BID  GASTROINTESTINAL A:   Fatty liver disease, severity unknown Abdominal pain with E coli bacteremia concerning for SBP P:   OG tube Protonix Start Tube Feeds today Abx as below  HEMATOLOGIC A:   Leukocytosis resolved P:  lovenox for DVT prophylaxis  INFECTIOUS A:   Sepsis from CAP E coli bacteremia? Resp Viral panel negative Strep pneumo antigen negative Legionella atigen pending P:   Ceftriaxone and Azithromycin Follow blood cultures and sensitivities Lower respiratory cultures  ENDOCRINE A:   DMII, poorly controlled On oral agents at home P:    SSI CBGs Likely start TF today  NEUROLOGIC A:   Sedation for mechanical ventilation P:   RASS goal: 0 Propofol Hold home gabapentin PRN fentanyl and versed   I have personally obtained a history, examined the patient, evaluated laboratory and imaging results, formulated the assessment and plan and placed orders. CRITICAL CARE: The patient is critically ill with multiple organ systems failure and requires high complexity decision making for assessment and support, frequent evaluation and titration of therapies, application of advanced monitoring technologies and extensive interpretation of multiple databases. Critical Care Time devoted to patient care services  described in this note is 35 minutes.   Nyra Market, MD PCCM - PGY1 Pulmonary and Critical Care Medicine [facility] Pager: [phone]  10/29/2016, 7:14 AM   ATTENDING NOTE / ATTESTATION NOTE :   I have discussed the case with the resident/APP Dr. Samuella Cota  I agree with the resident/APP's  history, physical examination, assessment, and plans.    I have edited the above note and modified it according to our agreed history, physical examination, assessment and plan.   Briefly, His wife says Mr Hinderliter is chronically short of breath on exertion but was able to walk around the grocery store without any problems yesterday. Today, he was much more out of breath at rest than usual despite not having fevers, chills, a cough. He had worse swelling in his legs and a rash from fleas, which he is taking hydroxyzine for. EMS was called and found him in extremis complaining of chest pain, shortness of breath. He was found to have a wide complex tachycardia and due to signs of poor perfusion was given a dose of synchronized cardioversion to no effect. He also was given a full aspirin and a sublingual nitroglycerin. On arrival to the ED, his oxygen saturations remained critically low despite NRB and BiPAP so the decision was  made to intubate him.  Pt was diuresced over weekend. Still sedated.   Vitals:  Vitals:   10/29/16 0700 10/29/16 0800 10/29/16 0810 10/29/16 0900  BP: 105/75 127/82 127/82 126/80  Pulse: 63 69 71 76  Resp: (!) 28 (!) 22 (!) 28 (!) 28  Temp:  98.6 F (37 C)    TempSrc:  Oral    SpO2: 100% 97% 100% 100%  Weight:      Height:        Constitutional/General: well-nourished, well-developed, intubated, sedated, not in any distress  Body mass index is 31.01 kg/m. Wt Readings from Last 3 Encounters:  10/29/16 106.6 kg (235 lb 0.2 oz)  10/10/13 95.2 kg (209 lb 14.1 oz)  04/12/13 86.2 kg (190 lb)    HEENT: PERLA, anicteric sclerae. (-) Oral thrush. Intubated, ETT in place  Neck: No masses. Midline trachea. No JVD, (-) LAD. (-) bruits appreciated.  Respiratory/Chest: Grossly normal chest. (-) deformity. (-) Accessory muscle use.  Symmetric expansion. Diminished BS on both lower lung zones. (-) wheezing, rhonchi Crackles at bases (-) egophony  Cardiovascular: Regular rate and  rhythm, heart sounds normal, no murmur or gallops,  Gr 1 peripheral edema  Gastrointestinal:  Normal bowel sounds. Soft, non-tender. No hepatosplenomegaly.  (-) masses.   Musculoskeletal:  Normal muscle tone.   Extremities: Grossly normal. (-) clubbing, cyanosis.  Gr 1  edema  Skin: (-) rash,lesions seen.   Neurological/Psychiatric : sedated, intubated. CN grossly intact. (-) lateralizing signs.     CBC Recent Labs     10/28/16  0235  10/28/16  1010  10/29/16  0224  WBC  13.1*  9.5  7.8  HGB  10.6*  10.3*  10.1*  HCT  33.7*  31.6*  31.9*  PLT  256  231  250    Coag's No results for input(s): APTT, INR in the last 72 hours.  BMET Recent Labs     10/28/16  0235  10/28/16  1010  10/29/16  0224  NA  140  139  140  K  4.5  3.6  3.1*  CL  106  104  102  CO2  BUN  15  14  12  CREATININE  1.19  1.17  1.30*  GLUCOSE  207*  153*  114*    Electrolytes Recent Labs      10/28/16  0235  10/28/16  0725  10/28/16  1010  10/29/16  0224  CALCIUM  7.7*   --   7.6*  7.7*  MG   --   1.5*   --   2.1  PHOS   --   3.5   --   3.0    Sepsis Markers Recent Labs     10/28/16  1314  10/29/16  0224  PROCALCITON  0.81  0.85    ABG Recent Labs     10/27/16  2034  10/28/16  0313  10/28/16  0911  PHART  7.258*  7.420  7.358  PCO2ART  52.5*  37.3  46.0  PO2ART  60.0*  139.0*  30.0*    Liver Enzymes Recent Labs     10/27/16  2022  AST  30  ALT  34  ALKPHOS  160*  BILITOT  0.5  ALBUMIN  3.6    Cardiac Enzymes Recent Labs     10/28/16  0725  10/28/16  1314  10/28/16  1858  TROPONINI  0.20*  0.19*  0.17*    Glucose Recent Labs     10/28/16  1211  10/28/16  1553  10/28/16  2005  10/28/16  2346  10/29/16  0344  10/29/16  0804  GLUCAP  140*  101*  98  147*  102*  115*    Imaging Dg Chest Port 1 View  Result Date: 10/29/2016 CLINICAL DATA:  Respiratory failure EXAM: PORTABLE CHEST 1 VIEW COMPARISON:  10/27/2016 FINDINGS: Cardiac shadow is enlarged. Endotracheal tube and nasogastric catheter noted in satisfactory position. Significant interval clearing is noted in the right lung although persistent right basilar infiltrate is seen. Mild left basilar infiltrate is noted as well. No bony abnormality is seen. IMPRESSION: Significant improved aeration in the right lung although persistent bibasilar infiltrates are seen. Electronically Signed   By: Alcide Clever M.D.   On: 10/29/2016 07:03   Dg Chest Portable 1 View  Result Date: 10/27/2016 CLINICAL DATA:  Status post intubation and respiratory failure EXAM: PORTABLE CHEST 1 VIEW COMPARISON:  10/10/2013 FINDINGS: Cardiac shadow is enlarged. Endotracheal tube is noted in satisfactory position. Nasogastric catheter has been placed but lies in the proximal esophagus at the level of the thoracic inlet. This raises suspicion for coiling within the posterior rings. Significant densities noted throughout the  right lung likely related to asymmetric pulmonary edema. Mild patchy changes are noted in the left lung base. IMPRESSION: Endotracheal tube in satisfactory position. Significant increased density particularly within the right lung consistent with pulmonary edema. Some left basilar atelectatic changes are noted. Gastric catheter is noted within the proximal esophagus. This raises suspicion for coiling proximally. Should be withdrawn and readvanced. Electronically Signed   By: Alcide Clever M.D.   On: 10/27/2016 20:41   Dg Abd Portable 1v  Result Date: 10/28/2016 CLINICAL DATA:  Orogastric tube placement EXAM: PORTABLE ABDOMEN - 1 VIEW COMPARISON:  June 25, 2007 FINDINGS: Orogastric tube tip and side port are in the stomach. There is stool throughout colon. There is somewhat of a paucity of small bowel gas. There is no bowel dilatation or air-fluid level to suggest bowel obstruction. No evident free air. IMPRESSION: Orogastric tube tip and side port in stomach. Relative paucity of small bowel gas. This finding may be seen  normally but also may be seen with enteritis or early ileus. Bowel obstruction not felt to be likely. No free air. Electronically Signed   By: Bretta Bang III M.D.   On: 10/28/2016 07:29    Assessment/Plan : Acute Hypoxemic resp failure 2/2 sepsis with Ecoli bacteremia and demand ischemia.  Concern for asp PNA and pulm edema - seems better with diuretics and abx.  - cont diuresis and abx. Cont rocephin and azithromycin for now. Clinically improved. Deescalate accordingly.  - await 2D echo.  Not sure if he has underlying CHF and CAD which worsened precipitating admission/intubation.    AKI - Cont with diuresis. Observe creatinine.    H/O Fatty Liver - check [place] of abdomen as part of work up.  - cont PPI - start TF   Best practice : PPI for SUP. Heparin for dvt prophylaxis.    I spent  30  minutes of Critical Care time with this patient today. This is my time spent  independent of the APP or resident.   Family :  No family at bedside.    Pollie Meyer, MD 10/29/2016, 9:36 AM [place] [facility] Pager [phone] After 3 pm or if no answer, call [phone]

## 2016-10-29 NOTE — Progress Notes (Signed)
Called Dr Danella Penton to inform pt potassium level 3.1

## 2016-10-29 NOTE — Progress Notes (Signed)
  Echocardiogram 2D Echocardiogram has been performed.  [name] [name] 10/29/2016, 1:15 PM

## 2016-10-30 ENCOUNTER — Inpatient Hospital Stay (HOSPITAL_COMMUNITY): Payer: Medicare Other

## 2016-10-30 DIAGNOSIS — J189 Pneumonia, unspecified organism: Secondary | ICD-10-CM

## 2016-10-30 LAB — CBC
HEMATOCRIT: 31.9 % — AB (ref 39.0–52.0)
HEMOGLOBIN: 10 g/dL — AB (ref 13.0–17.0)
MCH: 27.3 pg (ref 26.0–34.0)
MCHC: 31.3 g/dL (ref 30.0–36.0)
MCV: 87.2 fL (ref 78.0–100.0)
Platelets: 251 10*3/uL (ref 150–400)
RBC: 3.66 MIL/uL — AB (ref 4.22–5.81)
RDW: 14.6 % (ref 11.5–15.5)
WBC: 7.4 10*3/uL (ref 4.0–10.5)

## 2016-10-30 LAB — GLUCOSE, CAPILLARY
GLUCOSE-CAPILLARY: 129 mg/dL — AB (ref 65–99)
GLUCOSE-CAPILLARY: 159 mg/dL — AB (ref 65–99)
Glucose-Capillary: 144 mg/dL — ABNORMAL HIGH (ref 65–99)
Glucose-Capillary: 174 mg/dL — ABNORMAL HIGH (ref 65–99)
Glucose-Capillary: 246 mg/dL — ABNORMAL HIGH (ref 65–99)

## 2016-10-30 LAB — PROCALCITONIN: Procalcitonin: 0.52 ng/mL

## 2016-10-30 LAB — BASIC METABOLIC PANEL
ANION GAP: 9 (ref 5–15)
BUN: 22 mg/dL — ABNORMAL HIGH (ref 6–20)
CALCIUM: 7.8 mg/dL — AB (ref 8.9–10.3)
CO2: 29 mmol/L (ref 22–32)
Chloride: 101 mmol/L (ref 101–111)
Creatinine, Ser: 1.35 mg/dL — ABNORMAL HIGH (ref 0.61–1.24)
GFR calc non Af Amer: 53 mL/min — ABNORMAL LOW (ref >60)
Glucose, Bld: 120 mg/dL — ABNORMAL HIGH (ref 65–99)
Potassium: 3.5 mmol/L (ref 3.5–5.1)
Sodium: 139 mmol/L (ref 135–145)

## 2016-10-30 LAB — MAGNESIUM: MAGNESIUM: 2.1 mg/dL (ref 1.7–2.4)

## 2016-10-30 LAB — CULTURE, BLOOD (ROUTINE X 2): SPECIAL REQUESTS: ADEQUATE

## 2016-10-30 LAB — PHOSPHORUS: Phosphorus: 4 mg/dL (ref 2.5–4.6)

## 2016-10-30 MED ORDER — FUROSEMIDE 10 MG/ML IJ SOLN
40.0000 mg | Freq: Every day | INTRAMUSCULAR | Status: DC
  Administered 2016-10-31: 40 mg via INTRAVENOUS
  Filled 2016-10-30 (×2): qty 4

## 2016-10-30 MED ORDER — DEXMEDETOMIDINE HCL IN NACL 400 MCG/100ML IV SOLN
0.4000 ug/kg/h | INTRAVENOUS | Status: DC
  Administered 2016-10-30 – 2016-10-31 (×6): 1 ug/kg/h via INTRAVENOUS
  Filled 2016-10-30 (×6): qty 100

## 2016-10-30 MED ORDER — CARVEDILOL 6.25 MG PO TABS
6.2500 mg | ORAL_TABLET | Freq: Two times a day (BID) | ORAL | Status: DC
  Administered 2016-10-30 – 2016-10-31 (×2): 6.25 mg
  Filled 2016-10-30 (×4): qty 1

## 2016-10-30 MED ORDER — LABETALOL HCL 5 MG/ML IV SOLN
INTRAVENOUS | Status: AC
  Filled 2016-10-30: qty 4

## 2016-10-30 MED ORDER — LABETALOL HCL 5 MG/ML IV SOLN
10.0000 mg | INTRAVENOUS | Status: DC | PRN
  Administered 2016-10-30 – 2016-11-01 (×2): 10 mg via INTRAVENOUS
  Filled 2016-10-30: qty 4

## 2016-10-30 MED ORDER — VITAL AF 1.2 CAL PO LIQD
1000.0000 mL | ORAL | Status: DC
  Administered 2016-10-30 – 2016-10-31 (×2): 1000 mL
  Filled 2016-10-30: qty 1000

## 2016-10-30 MED ORDER — SODIUM CHLORIDE 0.9 % IV SOLN
0.4000 ug/kg/h | INTRAVENOUS | Status: DC
  Administered 2016-10-30: 0.4 ug/kg/h via INTRAVENOUS
  Filled 2016-10-30: qty 2

## 2016-10-30 NOTE — Progress Notes (Signed)
Nutrition Follow-up  DOCUMENTATION CODES:   Not applicable  INTERVENTION:   Change TF regimen:   Vital AF 1.2 at 80 ml/h (1920 ml per day)   Provides 2304 kcal, 144 gm protein, 1557 ml free water daily  NUTRITION DIAGNOSIS:   Inadequate oral intake related to inability to eat as evidenced by NPO status.  GOAL:   Provide needs based on ASPEN/SCCM guidelines  MONITOR:   Vent status, TF tolerance, Labs, I & O's  ASSESSMENT:   66 y.o. male with fatty liver disease, CAD, DM, HTN, HLD, anxiety who presented to the [facility] ED with acute respiratory failure. Required intubation on admission.   Discussed patient in ICU rounds and with RN today. Propofol has been discontinued, RD to adjust TF regimen to better meet nutrition needs. Patient is currently intubated on ventilator support MV: 12.3 L/min Temp (24hrs), Avg:98.7 F (37.1 C), Min:98.1 F (36.7 C), Max:100 F (37.8 C)  Propofol: off, sedation changed to Precedex  Labs reviewed. Medications reviewed and include Lasix.  Diet Order:  Diet NPO time specified Except for: Sips with Meds  Skin:  Reviewed, no issues  Last BM:  PTA  Height:   Ht Readings from Last 1 Encounters:  10/27/16  (1.854 m)    Weight:   Wt Readings from Last 1 Encounters:  10/30/16 226 lb 13.7 oz (102.9 kg)    Ideal Body Weight:  83.6 kg  BMI:  Body mass index is 29.93 kg/m.  Estimated Nutritional Needs:   Kcal:  2200  Protein:  145-165 gm  Fluid:  2.2 L  EDUCATION NEEDS:   No education needs identified at this time  Joaquin Courts, RD, LDN, CNSC Pager [phone] After Hours Pager [phone]

## 2016-10-30 NOTE — Procedures (Signed)
Pt placed back on full support at this time due to increased WOB, inc RR >40.  Pt tolerating full support well at this time, RT will monitor

## 2016-10-31 ENCOUNTER — Inpatient Hospital Stay (HOSPITAL_COMMUNITY): Payer: Medicare Other

## 2016-10-31 LAB — PHOSPHORUS: Phosphorus: 4.2 mg/dL (ref 2.5–4.6)

## 2016-10-31 LAB — BASIC METABOLIC PANEL
ANION GAP: 10 (ref 5–15)
BUN: 38 mg/dL — ABNORMAL HIGH (ref 6–20)
CALCIUM: 8.1 mg/dL — AB (ref 8.9–10.3)
CO2: 29 mmol/L (ref 22–32)
Chloride: 103 mmol/L (ref 101–111)
Creatinine, Ser: 1.06 mg/dL (ref 0.61–1.24)
GLUCOSE: 186 mg/dL — AB (ref 65–99)
Potassium: 3.8 mmol/L (ref 3.5–5.1)
Sodium: 142 mmol/L (ref 135–145)

## 2016-10-31 LAB — CBC
HCT: 34.2 % — ABNORMAL LOW (ref 39.0–52.0)
Hemoglobin: 11 g/dL — ABNORMAL LOW (ref 13.0–17.0)
MCH: 28.1 pg (ref 26.0–34.0)
MCHC: 32.2 g/dL (ref 30.0–36.0)
MCV: 87.2 fL (ref 78.0–100.0)
PLATELETS: 257 10*3/uL (ref 150–400)
RBC: 3.92 MIL/uL — ABNORMAL LOW (ref 4.22–5.81)
RDW: 14.2 % (ref 11.5–15.5)
WBC: 9 10*3/uL (ref 4.0–10.5)

## 2016-10-31 LAB — GLUCOSE, CAPILLARY
GLUCOSE-CAPILLARY: 146 mg/dL — AB (ref 65–99)
GLUCOSE-CAPILLARY: 172 mg/dL — AB (ref 65–99)
GLUCOSE-CAPILLARY: 186 mg/dL — AB (ref 65–99)
Glucose-Capillary: 150 mg/dL — ABNORMAL HIGH (ref 65–99)
Glucose-Capillary: 151 mg/dL — ABNORMAL HIGH (ref 65–99)
Glucose-Capillary: 155 mg/dL — ABNORMAL HIGH (ref 65–99)
Glucose-Capillary: 171 mg/dL — ABNORMAL HIGH (ref 65–99)

## 2016-10-31 LAB — TRIGLYCERIDES
TRIGLYCERIDES: 242 mg/dL — AB (ref <150)
Triglycerides: 210 mg/dL — ABNORMAL HIGH (ref <150)

## 2016-10-31 LAB — MAGNESIUM: MAGNESIUM: 2.1 mg/dL (ref 1.7–2.4)

## 2016-10-31 NOTE — Procedures (Signed)
Extubation Procedure Note  Patient Details:   Name: [name] DOB: March 23, 1951 MRN: [id number]   Airway Documentation:  Airway 7.5 mm (Active)  Secured at (cm) 24 cm 10/31/2016  8:16 AM  Measured From Lips 10/31/2016  8:16 AM  Secured Location Left 10/31/2016  8:16 AM  Secured By [employer] 10/31/2016  8:16 AM  Tube Holder Repositioned Yes 10/31/2016  8:16 AM  Cuff Pressure (cm H2O) 30 cm H2O 10/31/2016  8:16 AM  Site Condition Dry 10/31/2016  8:16 AM    Evaluation  O2 sats: stable throughout Complications: No apparent complications Patient did tolerate procedure well. Bilateral Breath Sounds: Clear, Diminished   Yes   Pt. Was extubated to a 4L [name] without any complications, dyspnea or stridor noted. Pt. Was instructed on IS x 5, highest goal achieved was 1,097mL.  [name] L 10/31/2016, 10:14 AM

## 2016-10-31 NOTE — Progress Notes (Signed)
PULMONARY  / CRITICAL CARE MEDICINE  Name: Lorenza Winkleman MRN: [id number] DOB: 1950/10/18    LOS: 4  REFERRING MD :  Dr Anitra Lauth  CHIEF COMPLAINT:  Acute respiratory failure  BRIEF PATIENT DESCRIPTION:  Karo Rog is a 66 y.o. male with fatty liver disease, CAD, DM, HTN, HLD, anxiety who presented with acute respiratory failure to the [facility] ED  His wife says Mr Goens is chronically short of breath on exertion but was able to walk around the grocery store without any problems yesterday. Today, he was much more out of breath at rest than usual despite not having fevers, chills, a cough. He had worse swelling in his legs and a rash from fleas, which he is taking hydroxyzine for. EMS was called and found him in extremis complaining of chest pain, shortness of breath. He was found to have a wide complex tachycardia and due to signs of poor perfusion was given a dose of synchronized cardioversion to no effect. He also was given a full aspirin and a sublingual nitroglycerin. On arrival to the ED, his oxygen saturations remained critically low despite NRB and BiPAP so the decision was made to intubate him.  SIGNIFICANT EVENTS:  4/28 - acute respiratory failure requiring intubation; admission to ICU, getting abx and diuresis  4/30 CXR - significant improvement in bil infiltrates ECHO: ER reduced to 45-50% doppler parameters c/w elevated end-diastolic filling pressures abd [place] 4/30: c/w fatty liver, no gallstones, 6mm polyp, no cholecystitis  5/1 weaning on PSV. Stopped weaning efforts d/t anxiety and WOB.  Changed to precedex. Decreased lasix 5/2 passed SBT   LINES / TUBES: PIV 4/28 ETT 4/28  CULTURES: BCx 4/28>> BCx 4/28>> E coli RVP 4/29: neg U strept 4/28: neg U legionella 4/28>>>neg Sputum 5/2 few GPC>>>  ANTIBIOTICS: Azithromyin 4/28>>> Ceftriaxone 4/28>>  INTERVAL HISTORY/subjective  Looks comfortable on SBT  VITAL SIGNS: Temp:  [98.3 F (36.8 C)-100.6 F (38.1 C)]  98.3 F (36.8 C) (05/02 0832) Pulse Rate:  [52-118] 52 (05/02 0816) Resp:  [14-28] 17 (05/02 0816) BP: (120-203)/(73-115) 149/83 (05/02 0816) SpO2:  [100 %] 100 % (05/02 0816) FiO2 (%):  [40 %] 40 % (05/02 0816) Weight:  [221 lb 9 oz (100.5 kg)] 221 lb 9 oz (100.5 kg) (05/02 0413)   HEMODYNAMICS:   VENTILATOR SETTINGS: Vent Mode: PRVC FiO2 (%):  [40 %] 40 % Set Rate:  [18 bmp-28 bmp] 18 bmp Vt Set:  [480 mL] 480 mL PEEP:  [8 cmH20] 8 cmH20 Pressure Support:  [5 cmH20] 5 cmH20 Plateau Pressure:  [18 cmH20-21 cmH20] 21 cmH20 INTAKE / OUTPUT:  Intake/Output Summary (Last 24 hours) at 10/31/16 0918 Last data filed at 10/31/16 0800  Gross per 24 hour  Intake          1717.05 ml  Output             2950 ml  Net         -1232.95 ml    PHYSICAL EXAMINATION:  General appearance:  66 Year old male, obese  NAD, conversant  Eyes: anicteric sclerae, moist conjunctivae; PERRL, EOMI bilaterally. Mouth:  membranes and no mucosal ulcerations; normal hard and soft palate Neck: Trachea midline; neck supple, no JVD Lungs/chest: Crackles on right, with normal respiratory effort and no intercostal retractions on PSV 5 CV: RRR, no MRGs  Abdomen: Soft, non-tender; no masses or HSM Extremities: positive lower extremity edema  Skin: Normal temperature, turgor and texture; no rash, ulcers or subcutaneous nodules Neuro/Psych: Appropriate affect,  alert and oriented to person, place and time   LABS: Cbc  Recent Labs Lab 10/29/16 0224 10/30/16 0246 10/31/16 0202  WBC 7.8 7.4 9.0  HGB 10.1* 10.0* 11.0*  HCT 31.9* 31.9* 34.2*  PLT 250 251 257    Chemistry   Recent Labs Lab 10/29/16 0224 10/29/16 1147 10/30/16 0246 10/31/16 0202  NA 140 142 139 142  K 3.1* 3.7 3.5 3.8  CL 102 105 101 103  CO2 BUN 12 16 22* 38*  CREATININE 1.30* 1.39* 1.35* 1.06  CALCIUM 7.7* 8.0* 7.8* 8.1*  MG 2.1  --  2.1 2.1  PHOS 3.0  --  4.0 4.2  GLUCOSE 114* 180* 120* 186*    Liver  fxn  Recent Labs Lab 10/27/16 2022  AST 30  ALT 34  ALKPHOS 160*  BILITOT 0.5  PROT 7.9  ALBUMIN 3.6   coags No results for input(s): APTT, INR in the last 168 hours. Sepsis markers  Recent Labs Lab 10/27/16 2028 10/27/16 2335 10/28/16 0156 10/28/16 1314 10/29/16 0224 10/30/16 0246  LATICACIDVEN 4.02* 2.01* 1.9  --   --   --   PROCALCITON  --   --   --  0.81 0.85 0.52   Cardiac markers  Recent Labs Lab 10/28/16 0725 10/28/16 1314 10/28/16 1858  TROPONINI 0.20* 0.19* 0.17*   BNP No results for input(s): PROBNP in the last 168 hours. ABG  Recent Labs Lab 10/27/16 2034 10/28/16 0313 10/28/16 0911  PHART 7.258* 7.420 7.358  PCO2ART 52.5* 37.3 46.0  PO2ART 60.0* 139.0* 30.0*  HCO3 23.4 24.3 26.0  TCO2 CBG trend  Recent Labs Lab 10/30/16 1202 10/30/16 1555 10/30/16 1938 10/31/16 0023 10/31/16 0343  GLUCAP 159* 174* 246* 172* 186*    ASSESSMENT / PLAN: Active Problems:  Acute Hypoxic hypercarbic and hypoxic respiratory failure in setting of R>L pulmonary infiltrates aspiration vs CAP w/ associated pulmonary edema +/- ALI.  -CXR personally reviewed shows improved aeration bilaterally but still w/ RLL airspace disease.  -weaning on PSV of 5 this am. F/vt look favorable  -RAS is 0 on Precedex.  Plan Cont SBT Extubate this am if tolerates complete SBT Cont abx IS and flutter post extubation Will need swallow eval Cont lasix for neg fluid balance as long as BP, BUN and cr allow.   HFpEF exacerbation w/ associated pulmonary edema LBBB felt chronic Troponin trended flat. Felt demand ischemia  Plan Cont daily lasix Asa and atorvostatin Cont coreg 6.25 bid  AKI.improved after dropping lasix dose Plan Cont current lasix and BP management  Serial chemistries Cont strict I&O  BPH Plan After extubation resume proscar and flomax Then can assess for foley removal   Fatty liver identified via abd [place].  Plan Will need outpt gi  eval   DM type 2 w/ hyperglycemia Plan ssi   DVT prophylaxis: lmwh SUP: ppi Diet: tubefeeds Activity: bedrest wean Disposition : ICU weaning   My critical care time 45 minutes  Simonne Martinet ACNP-BC [facility] Pulmonary/Critical Care Pager # [phone] OR # [phone] if no answer   ATTENDING NOTE / ATTESTATION NOTE :   I have discussed the case with the resident/APP  Anders Simmonds NP.   I agree with the resident/APP's  history, physical examination, assessment, and plans.    I have edited the above note and modified it according to our agreed history, physical examination, assessment and plan.   Briefly, pt with chronic shortness of  breath on exertion. He presented with acute SOB x 1 day with swelling of legs and a rash from fleas. EMS was called and found him in extremis complaining of chest pain, shortness of breath. He was found to have a wide complex tachycardia and due to signs of poor perfusion was given a dose of synchronized cardioversion to no effect. He also was given a full aspirin and a sublingual nitroglycerin. On arrival to the ED, his oxygen saturations remained critically low despite NRB and BiPAP so the decision was made to intubate him.  Has Ecoli bacteremia and rpt blood culture is (-).  (-) issues overnight.  Did excellent on PST this am.  Extubated and doing well.  Fair cough.  Some difficulties swallowing/choking.    Vitals:  Vitals:   10/31/16 1100 10/31/16 1200 10/31/16 1300 10/31/16 1400  BP: 134/77 137/77 121/68 118/70  Pulse: 66 67 70 71  Resp: 20 (!) 23 17 (!) 21  Temp:      TempSrc:      SpO2: 100% 100% 100% 100%  Weight:      Height:        Constitutional/General: well-nourished, well-developed,  not in any distress  Body mass index is 29.23 kg/m. Wt Readings from Last 3 Encounters:  10/31/16 100.5 kg (221 lb 9 oz)  10/10/13 95.2 kg (209 lb 14.1 oz)  04/12/13 86.2 kg (190 lb)    HEENT: PERLA, anicteric sclerae. (-) Oral thrush.    Neck: No masses. Midline trachea. No JVD, (-) LAD. (-) bruits appreciated.  Respiratory/Chest: Grossly normal chest. (-) deformity. (-) Accessory muscle use.  Symmetric expansion. Diminished BS on both lower lung zones. (-) wheezing, crackles, occasional rhonchi in bases (-) egophony  Cardiovascular: Regular rate and  rhythm, heart sounds normal, no murmur or gallops,  Trace peripheral edema  Gastrointestinal:  Normal bowel sounds. Soft, non-tender. No hepatosplenomegaly.  (-) masses.   Musculoskeletal:  Normal muscle tone.   Extremities: Grossly normal. (-) clubbing, cyanosis.  (-) edema  Skin: (-) rash,lesions seen.   Neurological/Psychiatric : sedated, intubated. CN grossly intact. (-) lateralizing signs.    CBC Recent Labs     10/29/16  0224  10/30/16  0246  10/31/16  0202  WBC  7.8  7.4  9.0  HGB  10.1*  10.0*  11.0*  HCT  31.9*  31.9*  34.2*  PLT  250  251  257    Coag's No results for input(s): APTT, INR in the last 72 hours.  BMET Recent Labs     10/29/16  1147  10/30/16  0246  10/31/16  0202  NA  142  139  142  K  3.7  3.5  3.8  CL  105  101  103  CO2  BUN  16  22*  38*  CREATININE  1.39*  1.35*  1.06  GLUCOSE  180*  120*  186*    Electrolytes Recent Labs     10/29/16  0224  10/29/16  1147  10/30/16  0246  10/31/16  0202  CALCIUM  7.7*  8.0*  7.8*  8.1*  MG  2.1   --   2.1  2.1  PHOS  3.0   --   4.0  4.2    Sepsis Markers Recent Labs     10/29/16  0224  10/30/16  0246  PROCALCITON  0.85  0.52    ABG No results for input(s): PHART, PCO2ART, PO2ART in the  last 72 hours.  Liver Enzymes No results for input(s): AST, ALT, ALKPHOS, BILITOT, ALBUMIN in the last 72 hours.  Cardiac Enzymes Recent Labs     10/28/16  1858  TROPONINI  0.17*    Glucose Recent Labs     10/30/16  1202  10/30/16  1555  10/30/16  1938  10/31/16  0023  10/31/16  0343  10/31/16  1218  GLUCAP  159*  174*  246*  172*  186*  155*     Imaging US Abdomen Complete  Result Date: 10/30/2016 CLINICAL DATA:  Abdominal pain. EXAM: ABDOMEN ULTRASOUND COMPLETE COMPARISON:  KUB 04/30/ 2018.  Ultrasound 01/19/2008. FINDINGS: Gallbladder: No gallstones. 6 mm gallbladder polyp noted on today's exam. This previously measured 5 mm on prior exam. A new 3 mm polyp noted on today's exam . Gallbladder wall thickness 1.3 mm. Negative Murphy sign. Common bile duct: Diameter: 3 mm. Liver: Increased echogenicity of the liver again noted consistent with fatty infiltration. No focal hepatic abnormality. IVC: No abnormality visualized. Pancreas: Visualized portion unremarkable. Spleen: Size and appearance within normal limits. Right Kidney: Length: 11.7 cm. Echogenicity within normal limits. No mass or hydronephrosis visualized. Left Kidney: Length: 0.7 cm. Echogenicity within normal limits. No mass or hydronephrosis visualized. Abdominal aorta: Mild ectasia at 2.5 cm. Other findings: None. IMPRESSION: 1. No gallstones. 6 mm gallbladder polyp noted on today's exam. This previously measured 5 mm on prior ultrasound of 01/19/2008. A new 3 mm polyp noted on today's exam. No evidence of cholecystitis. No biliary distention. 2. Increased echogenicity of the liver again noted consistent with fatty infiltration. 3. Mild abdominal aortic ectasia 2.5 cm. Ectatic abdominal aorta at risk for aneurysm development. Recommend followup by ultrasound in 5 years. This recommendation follows ACR consensus guidelines: White Paper of the ACR Incidental Findings Committee II on Vascular Findings. J Am Coll Radiol 2013; 10:789-794. Electronically Signed   By: Maisie Fus  Register   On: 10/30/2016 06:31   Dg Chest Port 1 View  Result Date: 10/31/2016 CLINICAL DATA:  Pneumonia.  Shortness of breath. EXAM: PORTABLE CHEST 1 VIEW COMPARISON:  10/30/2016. FINDINGS: Endotracheal tube and NG tube in stable position. Stable cardiomegaly. Low lung volumes. Persistent bibasilar infiltrates without  interim change. Small bilateral pleural effusions. No pneumothorax. IMPRESSION: 1. Lines and tubes in stable position. 2. Low lung volumes. Persistent bibasilar infiltrates. Small bilateral pleural effusions. Findings are unchanged from prior exam. 3.  Cardiomegaly again noted. Electronically Signed   By: Maisie Fus  Register   On: 10/31/2016 07:14   Dg Chest Port 1 View  Result Date: 10/30/2016 CLINICAL DATA:  Respiratory failure, shortness of breath, intubated patient. EXAM: PORTABLE CHEST 1 VIEW COMPARISON:  Portable chest x-ray of October 29, 2016 FINDINGS: The lungs are reasonably well inflated. The patient's hand overlies the right apex. The interstitial markings remain coarse. Bibasilar densities are stable. There is no large pleural effusion and no visible pneumothorax. The cardiac silhouette remains enlarged. The pulmonary vascularity is not engorged. The endotracheal tube tip lies approximately 2.8 cm above the carina. The esophagogastric tube tip projects below the inferior margin of the image. IMPRESSION: Allowing for differences in positioning there has not been significant interval change since a previous study. There is bibasilar atelectasis or pneumonia. There is stable cardiomegaly without significant pulmonary vascular congestion. The support tubes are in reasonable position. Electronically Signed   By: David  [place] M.D.   On: 10/30/2016 07:11   Dg Abd Portable 1v  Result Date: 10/31/2016 CLINICAL DATA:  Orogastric  tube placement EXAM: PORTABLE ABDOMEN - 1 VIEW COMPARISON:  October 29, 2016 FINDINGS: Orogastric tube tip and side port are in the stomach. There is slight colon dilatation. There is no small bowel dilatation. No air-fluid levels. No free air. IMPRESSION: Orogastric tube tip and side port in stomach. Suspect a degree of colonic ileus. Obstruction is felt to be less likely. No free air. Electronically Signed   By: Bretta Bang III M.D.   On: 10/31/2016 07:12   Dg Abd Portable  1v  Result Date: 10/29/2016 CLINICAL DATA:  Confirm orogastric tube placement EXAM: PORTABLE ABDOMEN - 1 VIEW COMPARISON:  Yesterday FINDINGS: An orogastric tube tip is at the descending duodenum. Nonobstructive bowel gas pattern. Lung opacities, dedicated chest x-ray from earlier today. IMPRESSION: Orogastric tube tip at the descending duodenum. Electronically Signed   By: Marnee Spring M.D.   On: 10/29/2016 15:29    Assessment/Plan : Acute Hypoxemic respiratory failure 2/2 CAP + Asp PNA + Pulm edema + demand ischemia - improved. Extubated this am and doing fair.  - cont Rocephin + Azithromycin for now.  Consider switching to PO abx in am if he passes his swallow  evaln.  - cont diuresis today. Hold off subsequently. Keep euvolemic.  - will need swallow evaln    Acute CHFpEF exacerbation + Moderate AI - cont diuresis today. Assess volume status in am.  - BB as ordered - He was seeing Cardiology until 2012.  He needs to be plugged back into their office as he has chronic SOB which worsened recently  and has moderate AI.    Fatty Liver - observe LFTs.  - Abd [place] U/R   DM - cont SS insulin     I spent  30   minutes of Critical Care time with this patient today. This is my time spent independent of the APP or resident.   Family :Family updated today. Discussed plan with pt, wife, sister in law.     Pollie Meyer, MD 10/31/2016, 2:23 PM [facility] Pager [phone] After 3 pm or if no answer, call [phone]

## 2016-10-31 NOTE — Progress Notes (Signed)
Entered pt's room, pt's OGT on floor with tube feeds infusing. Pt's tube feeds stopped, new OGT placed and Abd xray ordered to confirm placement.

## 2016-10-31 NOTE — Care Management Note (Addendum)
Case Management Note  Patient Details  Name: Domenick Quebedeaux MRN: [id number] Date of Birth: 1951/04/13  Subjective/Objective:    Pt admitted with ARF - ventilated on sedation                Action/Plan:  PTA from home with wife.     Expected Discharge Date:                  Expected Discharge Plan:     In-House Referral:     Discharge planning Services  CM Consult  Post Acute Care Choice:    Choice offered to:     DME Arranged:    DME Agency:     HH Arranged:    HH Agency:     Status of Service:     If discussed at [employer], dates discussed:    Additional Comments: 10/31/2016 Pt extubated today.  Pt remains on Precedex and Lasix drip. CSW aware of bedbug situation Cherylann Parr, RN 10/31/2016, 3:10 PM

## 2016-11-01 DIAGNOSIS — J189 Pneumonia, unspecified organism: Secondary | ICD-10-CM

## 2016-11-01 DIAGNOSIS — I5030 Unspecified diastolic (congestive) heart failure: Secondary | ICD-10-CM

## 2016-11-01 LAB — BASIC METABOLIC PANEL
Anion gap: 12 (ref 5–15)
BUN: 31 mg/dL — AB (ref 6–20)
CALCIUM: 8.4 mg/dL — AB (ref 8.9–10.3)
CO2: 29 mmol/L (ref 22–32)
Chloride: 102 mmol/L (ref 101–111)
Creatinine, Ser: 1.11 mg/dL (ref 0.61–1.24)
GFR calc Af Amer: >60 mL/min (ref >60)
Glucose, Bld: 140 mg/dL — ABNORMAL HIGH (ref 65–99)
POTASSIUM: 3.7 mmol/L (ref 3.5–5.1)
SODIUM: 143 mmol/L (ref 135–145)

## 2016-11-01 LAB — CULTURE, BLOOD (ROUTINE X 2)
Culture: NO GROWTH
Special Requests: ADEQUATE

## 2016-11-01 LAB — GLUCOSE, CAPILLARY
GLUCOSE-CAPILLARY: 156 mg/dL — AB (ref 65–99)
GLUCOSE-CAPILLARY: 159 mg/dL — AB (ref 65–99)
Glucose-Capillary: 154 mg/dL — ABNORMAL HIGH (ref 65–99)
Glucose-Capillary: 141 mg/dL — ABNORMAL HIGH (ref 65–99)
Glucose-Capillary: 146 mg/dL — ABNORMAL HIGH (ref 65–99)

## 2016-11-01 LAB — CBC
HCT: 36.6 % — ABNORMAL LOW (ref 39.0–52.0)
Hemoglobin: 11.5 g/dL — ABNORMAL LOW (ref 13.0–17.0)
MCH: 27.6 pg (ref 26.0–34.0)
MCHC: 31.4 g/dL (ref 30.0–36.0)
MCV: 88 fL (ref 78.0–100.0)
Platelets: 314 10*3/uL (ref 150–400)
RBC: 4.16 MIL/uL — ABNORMAL LOW (ref 4.22–5.81)
RDW: 14.2 % (ref 11.5–15.5)
WBC: 10.3 10*3/uL (ref 4.0–10.5)

## 2016-11-01 LAB — PHOSPHORUS: Phosphorus: 3.4 mg/dL (ref 2.5–4.6)

## 2016-11-01 LAB — MAGNESIUM: Magnesium: 2.2 mg/dL (ref 1.7–2.4)

## 2016-11-01 MED ORDER — ASPIRIN EC 81 MG PO TBEC
81.0000 mg | DELAYED_RELEASE_TABLET | Freq: Every day | ORAL | Status: DC
  Administered 2016-11-02: 81 mg via ORAL
  Filled 2016-11-01 (×2): qty 1

## 2016-11-01 MED ORDER — ORAL CARE MOUTH RINSE
15.0000 mL | Freq: Two times a day (BID) | OROMUCOSAL | Status: DC
  Administered 2016-11-01 – 2016-11-02 (×2): 15 mL via OROMUCOSAL

## 2016-11-01 MED ORDER — TAMSULOSIN HCL 0.4 MG PO CAPS
0.4000 mg | ORAL_CAPSULE | Freq: Every day | ORAL | Status: DC
  Administered 2016-11-01 – 2016-11-02 (×2): 0.4 mg via ORAL
  Filled 2016-11-01 (×2): qty 1

## 2016-11-01 MED ORDER — CARVEDILOL 6.25 MG PO TABS
6.2500 mg | ORAL_TABLET | Freq: Two times a day (BID) | ORAL | Status: DC
  Administered 2016-11-01 – 2016-11-02 (×2): 6.25 mg via ORAL
  Filled 2016-11-01 (×3): qty 1

## 2016-11-01 MED ORDER — FINASTERIDE 5 MG PO TABS
5.0000 mg | ORAL_TABLET | Freq: Every day | ORAL | Status: DC
Start: 2016-11-01 — End: 2016-11-02
  Administered 2016-11-01 – 2016-11-02 (×2): 5 mg via ORAL
  Filled 2016-11-01 (×2): qty 1

## 2016-11-01 MED ORDER — FUROSEMIDE 20 MG PO TABS
20.0000 mg | ORAL_TABLET | Freq: Every day | ORAL | Status: DC
  Administered 2016-11-01 – 2016-11-02 (×2): 20 mg via ORAL
  Filled 2016-11-01 (×2): qty 1

## 2016-11-01 MED ORDER — PANTOPRAZOLE SODIUM 40 MG PO TBEC
40.0000 mg | DELAYED_RELEASE_TABLET | Freq: Every day | ORAL | Status: DC
  Administered 2016-11-01 – 2016-11-02 (×2): 40 mg via ORAL
  Filled 2016-11-01 (×2): qty 1

## 2016-11-01 MED ORDER — ONDANSETRON HCL 4 MG/2ML IJ SOLN
4.0000 mg | Freq: Four times a day (QID) | INTRAMUSCULAR | Status: DC | PRN
  Administered 2016-11-01: 4 mg via INTRAVENOUS
  Filled 2016-11-01 (×2): qty 2

## 2016-11-01 NOTE — Progress Notes (Signed)
Called [facility], Dr Belia HemanKasa informed pt with urinary retention 430cc note by bladder scan, md informed pt home med noted for flomax and proscar. Will do I/O cath per md okay.

## 2016-11-01 NOTE — Procedures (Addendum)
Objective Swallowing Evaluation: Type of Study: FEES-Fiberoptic Endoscopic Evaluation of Swallow  Patient Details  Name: [name] MRN: [id number] Date of Birth: 03/08/1951  Today's Date: 11/01/2016 Time: SLP Start Time (ACUTE ONLY): 0917-SLP Stop Time (ACUTE ONLY): 0945 SLP Time Calculation (min) (ACUTE ONLY): 28 min  Past Medical History:  Past Medical History:  Diagnosis Date  . 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 Diabetes mellitus 10/17/2011   newly dx today  . Edema leg    right leg has leaky valve and right foot swells  . Emphysema   . Excessive ear wax   . Fatty liver   . Hernia    near navel  . Hyperlipidemia   . Hypertension   . Leaky heart valve   . Neuropathy   . Rheumatic fever   . Slow urinary stream   . TIA (transient ischemic attack)    Past Surgical History:  Past Surgical History:  Procedure Laterality Date  . CIRCUMCISION    . LITHOTRIPSY     HPI: 66 y.o.malewith fatty liver disease, CAD, DM, HTN, HLD, TIA, anxiety who presented with acute respiratory failure in the setting of right greater than left pulmonary infiltrates, aspiration vs CAP with associated pulmondary edema. Intubated 4/28-5/2.  Patient endorses GER with occasional globus.   No Data Recorded   Assessment / Plan / Recommendation  CHL IP CLINICAL IMPRESSIONS 11/01/2016  Clinical Impression Patient presents with a mild pharyngeal dysphagia with question of esophageal component. Appearance of decreased pharyngeal constriction and laryngeal closure noted during swallow, intermittently completing multiple rapid consecutive swallows to clear thin liquids, however with full airway protection. No aspiration or penetration observed. Mild pharyngeal residuals, largely coating noted post swallow, decreased as study progressed with one episode of slight regurgitation of regular texture solid noted post swallow. RFS 7 indicative of mild inflammation.  Discussed results with MD. Shanon Rosserannot r/o esophageal component given presentation with post-prandial aspiration although at this time, patient judged safe to resume a regular diet.  SLP will f/u briefly for education.   SLP Visit Diagnosis Dysphagia, pharyngoesophageal phase (R13.14)  Attention and concentration deficit following --  Frontal lobe and executive function deficit following --  Impact on safety and function Mild aspiration risk      CHL IP TREATMENT RECOMMENDATION 11/01/2016  Treatment Recommendations Therapy as outlined in treatment plan below     No flowsheet data found.  CHL IP DIET RECOMMENDATION 11/01/2016  SLP Diet Recommendations Regular solids;Thin liquid  Liquid Administration via Cup;Straw  Medication Administration Whole meds with liquid  Compensations Slow rate;Small sips/bites;Follow solids with liquid  Postural Changes Seated upright at 90 degrees;Remain semi-upright after after feeds/meals (Comment)      CHL IP OTHER RECOMMENDATIONS 11/01/2016  Recommended Consults --  Oral Care Recommendations Oral care BID  Other Recommendations --      CHL IP FOLLOW UP RECOMMENDATIONS 11/01/2016  Follow up Recommendations None      CHL IP FREQUENCY AND DURATION 11/01/2016  Speech Therapy Frequency (ACUTE ONLY) min 1 x/week  Treatment Duration 1 week           CHL IP ORAL PHASE 11/01/2016  Oral Phase WFL  Oral - Pudding Teaspoon --  Oral - Pudding Cup --  Oral - Honey Teaspoon --  Oral - Honey Cup --  Oral - Nectar Teaspoon --  Oral - Nectar Cup --  Oral - Nectar Straw --  Oral - Thin Teaspoon --  Oral -  Thin Cup --  Oral - Thin Straw --  Oral - Puree --  Oral - Mech Soft --  Oral - Regular --  Oral - Multi-Consistency --  Oral - Pill --  Oral Phase - Comment --    CHL IP PHARYNGEAL PHASE 11/01/2016  Pharyngeal Phase Impaired  Pharyngeal- Pudding Teaspoon --  Pharyngeal --  Pharyngeal- Pudding Cup --  Pharyngeal --  Pharyngeal- Honey Teaspoon --  Pharyngeal  --  Pharyngeal- Honey Cup --  Pharyngeal --  Pharyngeal- Nectar Teaspoon Reduced airway/laryngeal closure;Reduced pharyngeal peristalsis;Pharyngeal residue - valleculae;Pharyngeal residue - pyriform  Pharyngeal --  Pharyngeal- Nectar Cup Reduced airway/laryngeal closure;Reduced pharyngeal peristalsis;Pharyngeal residue - valleculae;Pharyngeal residue - pyriform  Pharyngeal --  Pharyngeal- Nectar Straw --  Pharyngeal --  Pharyngeal- Thin Teaspoon Reduced airway/laryngeal closure;Reduced pharyngeal peristalsis;Pharyngeal residue - valleculae;Pharyngeal residue - pyriform  Pharyngeal --  Pharyngeal- Thin Cup Reduced airway/laryngeal closure;Reduced pharyngeal peristalsis;Pharyngeal residue - valleculae;Pharyngeal residue - pyriform  Pharyngeal --  Pharyngeal- Thin Straw Reduced airway/laryngeal closure;Reduced pharyngeal peristalsis;Pharyngeal residue - valleculae;Pharyngeal residue - pyriform  Pharyngeal --  Pharyngeal- Puree Reduced airway/laryngeal closure;Reduced pharyngeal peristalsis;Pharyngeal residue - valleculae;Pharyngeal residue - pyriform  Pharyngeal --  Pharyngeal- Mechanical Soft --  Pharyngeal --  Pharyngeal- Regular Reduced airway/laryngeal closure;Reduced pharyngeal peristalsis  Pharyngeal --  Pharyngeal- Multi-consistency --  Pharyngeal --  Pharyngeal- Pill Reduced airway/laryngeal closure;Reduced pharyngeal peristalsis  Pharyngeal --  Pharyngeal Comment --     CHL IP CERVICAL ESOPHAGEAL PHASE 11/01/2016  Cervical Esophageal Phase Impaired  Pudding Teaspoon --  Pudding Cup --  Honey Teaspoon --  Honey Cup --  Nectar Teaspoon --  Nectar Cup --  Nectar Straw --  Thin Teaspoon --  Thin Cup --  Thin Straw --  Puree --  Mechanical Soft --  Regular --  Multi-consistency --  Pill --  Cervical Esophageal Comment --    [name], CCC-SLP [phone]  [name] 11/01/2016, 9:54 AM

## 2016-11-01 NOTE — Progress Notes (Signed)
PULMONARY  / CRITICAL CARE MEDICINE  Name: [name] MRN: [id number] DOB: 09/29/1950    LOS: 5  REFERRING MD :  Dr Anitra LauthPlunkett  CHIEF COMPLAINT:  Acute respiratory failure  BRIEF PATIENT DESCRIPTION:  [name] is a 66 y.o. male with fatty liver disease, CAD, DM, HTN, HLD, anxiety who presented with acute respiratory failure to the [facility]  His wife says [name] is chronically short of breath on exertion but was able to walk around the grocery store without any problems yesterday. Today, he was much more out of breath at rest than usual despite not having fevers, chills, a cough. He had worse swelling in his legs and a rash from fleas, which he is taking hydroxyzine for. EMS was called and found him in extremis complaining of chest pain, shortness of breath. He was found to have a wide complex tachycardia and due to signs of poor perfusion was given a dose of synchronized cardioversion to no effect. He also was given a full aspirin and a sublingual nitroglycerin. On arrival to the ED, his oxygen saturations remained critically low despite NRB and BiPAP so the decision was made to intubate him.  SIGNIFICANT EVENTS:  4/28 - acute respiratory failure requiring intubation; admission to ICU, getting abx and diuresis  4/30 CXR - significant improvement in bil infiltrates ECHO: ER reduced to 45-50% doppler parameters c/w elevated end-diastolic filling pressures abd US 4/30: c/w fatty liver, no gallstones, 6mm polyp, no cholecystitis  5/1 weaning on PSV. Stopped weaning efforts d/t anxiety and WOB.  Changed to precedex. Decreased lasix 5/2 passed SBT and extubated 5/3 transfer to floor and to [facility].  LINES / TUBES: PIV 4/28 ETT 4/28>>5/2  CULTURES: BCx 4/28>> BCx 4/28>> E coli RVP 4/29: neg U strept 4/28: neg U legionella 4/28>>>neg Sputum 5/2 few GPC>>>  ANTIBIOTICS: Azithromyin 4/28>>>5/1 Ceftriaxone 4/28>>  INTERVAL HISTORY/subjective  Awake and alert. NAD  since extubation  VITAL SIGNS: Temp:  [98 F (36.7 C)-98.4 F (36.9 C)] 98.2 F (36.8 C) (05/03 0824) Pulse Rate:  [64-103] 82 (05/03 0729) Resp:  [13-25] 23 (05/03 0900) BP: (118-179)/(66-100) 179/100 (05/03 0900) SpO2:  [93 %-100 %] 93 % (05/03 0900) Weight:  [220 lb 0.3 oz (99.8 kg)] 220 lb 0.3 oz (99.8 kg) (05/03 0400)   HEMODYNAMICS:   VENTILATOR SETTINGS:   INTAKE / OUTPUT:  Intake/Output Summary (Last 24 hours) at 11/01/16 1004 Last data filed at 11/01/16 0900  Gross per 24 hour  Intake              510 ml  Output             1950 ml  Net            -1440 ml   Filed Weights   10/30/16 0406 10/31/16 0413 11/01/16 0400  Weight: 226 lb 13.7 oz (102.9 kg) 221 lb 9 oz (100.5 kg) 220 lb 0.3 oz (99.8 kg)    PHYSICAL EXAMINATION:  General:  Obese kyphotic male in no distress. HEENT: , oral mucosa coated with dye from FEES. UUV:OZDGPSY:Good affect Neuro: Intact CV: s1s2 rrr, no m/r/g PULM: Decreased air movement UY:QIHKGI:soft, non-tender, bsx4 active  Extremities: warm/dry,+ edema  Skin: no rashes or lesions   LABS: Cbc  Recent Labs Lab 10/30/16 0246 10/31/16 0202 11/01/16 0316  WBC 7.4 9.0 10.3  HGB 10.0* 11.0* 11.5*  HCT 31.9* 34.2* 36.6*  PLT 251 257 314    Chemistry   Recent Labs Lab 10/30/16 0246  10/31/16 0202 11/01/16 0316  NA 139 142 143  K 3.5 3.8 3.7  CL 101 103 102  CO2 29 29 29   BUN 22* 38* 31*  CREATININE 1.35* 1.06 1.11  CALCIUM 7.8* 8.1* 8.4*  MG 2.1 2.1 2.2  PHOS 4.0 4.2 3.4  GLUCOSE 120* 186* 140*    Liver fxn  Recent Labs Lab 10/27/16 2022  AST 30  ALT 34  ALKPHOS 160*  BILITOT 0.5  PROT 7.9  ALBUMIN 3.6   coags No results for input(s): APTT, INR in the last 168 hours. Sepsis markers  Recent Labs Lab 10/27/16 2028 10/27/16 2335 10/28/16 0156 10/28/16 1314 10/29/16 0224 10/30/16 0246  LATICACIDVEN 4.02* 2.01* 1.9  --   --   --   PROCALCITON  --   --   --  0.81 0.85 0.52   Cardiac markers  Recent Labs Lab  10/28/16 0725 10/28/16 1314 10/28/16 1858  TROPONINI 0.20* 0.19* 0.17*   BNP No results for input(s): PROBNP in the last 168 hours. ABG  Recent Labs Lab 10/27/16 2034 10/28/16 0313 10/28/16 0911  PHART 7.258* 7.420 7.358  PCO2ART 52.5* 37.3 46.0  PO2ART 60.0* 139.0* 30.0*  HCO3 23.4 24.3 26.0  TCO2 25 25 27     CBG trend  Recent Labs Lab 10/31/16 1605 10/31/16 1925 11/01/16 0001 11/01/16 0356 11/01/16 0822  GLUCAP 146* 150* 151* 154* 156*    ASSESSMENT / PLAN: Active Problems:  Acute Hypoxic hypercarbic and hypoxic respiratory failure in setting of R>L pulmonary infiltrates aspiration vs CAP w/ associated pulmonary edema +/- ALI.  Extubated O2 as needed.  Plan Extubated 5/2 Cont abx IS and flutter post extubation Note swallow eval ok to eat Cont lasix for neg fluid balance as long as BP, BUN and cr allow.   HFpEF exacerbation w/ associated pulmonary edema LBBB felt chronic Troponin trended flat. Felt demand ischemia  Plan Change lasix  to PO and continue daily x 48 hours then review. Asa and atorvostatin Cont coreg 6.25 bid  AKI.improved after dropping lasix dose Lab Results  Component Value Date   CREATININE 1.11 11/01/2016   CREATININE 1.06 10/31/2016   CREATININE 1.35 (H) 10/30/2016   Filed Weights   10/30/16 0406 10/31/16 0413 11/01/16 0400  Weight: 226 lb 13.7 oz (102.9 kg) 221 lb 9 oz (100.5 kg) 220 lb 0.3 oz (99.8 kg)    Plan Cont current lasix and BP management  Serial chemistries Cont strict I&O  BPH Plan  resume proscar and flomax    Fatty liver identified via abd [place].  Plan Will need outpt gi eval   DM type 2 w/ hyperglycemia CBG (last 3)   Recent Labs  11/01/16 0001 11/01/16 0356 11/01/16 0822  GLUCAP 151* 154* 156*     Plan ssi     DVT prophylaxis: lmwh SUP: ppi Diet: Advanced to low na per ST Activity: OOB and ambulate as tolerated Disposition :  To tele 5/3. Report given to Dr. Nelson Chimes of [facility] 5/3 1030  and to pick up 5/4 0700.   Brett Canales Minor ACNP [facility] PCCM Pager [phone] till 3 pm If no answer page [phone] 11/01/2016, 10:04 AM   ATTENDING NOTE / ATTESTATION NOTE :   I have discussed the case with the resident/APP  Brett Canales Minor NP  I agree with the resident/APP's  history, physical examination, assessment, and plans.    I have edited the above note and modified it according to our agreed history, physical examination, assessment and plan.  Briefly, pt withchronic shortness of breath on exertion. He presented with acute SOB x 1 day with swelling of legs and a rash from fleas.EMS was called and found him in extremis complaining of chest pain, shortness of breath. He was found to have a wide complex tachycardia and due to signs of poor perfusion was given a dose of synchronized cardioversion to no effect. He also was given a full aspirin and a sublingual nitroglycerin. On arrival to the ED, his oxygen saturations remained critically low despite NRB and BiPAP so the decision was made to intubate him.  Has Ecoli bacteremia and rpt blood culture is (-).  (-) issues overnight. Extubated on 5/2.  Passed swallow evaluation.  (-) other complaints  Vitals:  Vitals:   11/01/16 1200 11/01/16 1213 11/01/16 1300 11/01/16 1334  BP: (!) 166/89  (!) 164/87 (!) 150/82  Pulse: 78  83 86  Resp: (!) 21  20 20   Temp:  98.3 F (36.8 C)  98.5 F (36.9 C)  TempSrc:  Oral  Oral  SpO2: 93%  (!) 89% 92%  Weight:    98.2 kg (216 lb 8 oz)  Height:    5\' 7"  (1.702 m)    Constitutional/General: well-nourished, well-developed, not in any distress  Body mass index is 33.91 kg/m. Wt Readings from Last 3 Encounters:  11/01/16 98.2 kg (216 lb 8 oz)  10/10/13 95.2 kg (209 lb 14.1 oz)  04/12/13 86.2 kg (190 lb)    HEENT: PERLA, anicteric sclerae. (-) Oral thrush.  Neck: No masses. Midline trachea. No JVD, (-) LAD. (-) bruits appreciated.  Respiratory/Chest: Grossly normal chest. (-) deformity. (-)  Accessory muscle use.  Symmetric expansion. Diminished BS on both lower lung zones. (-) wheezing, crackles Occasional rhonchi at bases (-) egophony  Cardiovascular: Regular rate and  rhythm, heart sounds normal, no murmur or gallops,  Gr 1 peripheral edema  Gastrointestinal:  Normal bowel sounds. Soft, non-tender. No hepatosplenomegaly.  (-) masses.   Musculoskeletal:  Normal muscle tone.   Extremities: Grossly normal. (-) clubbing, cyanosis.  Gr 1 edema  Skin: (-) rash,lesions seen.   Neurological/Psychiatric :CN grossly intact. (-) lateralizing signs.     CBC Recent Labs     10/30/16  0246  10/31/16  0202  11/01/16  0316  WBC  7.4  9.0  10.3  HGB  10.0*  11.0*  11.5*  HCT  31.9*  34.2*  36.6*  PLT  251  257  314    Coag's No results for input(s): APTT, INR in the last 72 hours.  BMET Recent Labs     10/30/16  0246  10/31/16  0202  11/01/16  0316  NA  139  142  143  K  3.5  3.8  3.7  CL  101  103  102  CO2  29  29  29   BUN  22*  38*  31*  CREATININE  1.35*  1.06  1.11  GLUCOSE  120*  186*  140*    Electrolytes Recent Labs     10/30/16  0246  10/31/16  0202  11/01/16  0316  CALCIUM  7.8*  8.1*  8.4*  MG  2.1  2.1  2.2  PHOS  4.0  4.2  3.4    Sepsis Markers Recent Labs     10/30/16  0246  PROCALCITON  0.52    ABG No results for input(s): PHART, PCO2ART, PO2ART in the last 72 hours.  Liver Enzymes No results for input(s): AST,  ALT, ALKPHOS, BILITOT, ALBUMIN in the last 72 hours.  Cardiac Enzymes No results for input(s): TROPONINI, PROBNP in the last 72 hours.  Glucose Recent Labs     10/31/16  1605  10/31/16  1925  11/01/16  0001  11/01/16  0356  11/01/16  0822  11/01/16  1209  GLUCAP  146*  150*  151*  154*  156*  159*    Imaging Dg Chest Port 1 View  Result Date: 10/31/2016 CLINICAL DATA:  Pneumonia.  Shortness of breath. EXAM: PORTABLE CHEST 1 VIEW COMPARISON:  10/30/2016. FINDINGS: Endotracheal tube and NG tube in stable  position. Stable cardiomegaly. Low lung volumes. Persistent bibasilar infiltrates without interim change. Small bilateral pleural effusions. No pneumothorax. IMPRESSION: 1. Lines and tubes in stable position. 2. Low lung volumes. Persistent bibasilar infiltrates. Small bilateral pleural effusions. Findings are unchanged from prior exam. 3.  Cardiomegaly again noted. Electronically Signed   By: Maisie Fus  Register   On: 10/31/2016 07:14   Dg Abd Portable 1v  Result Date: 10/31/2016 CLINICAL DATA:  Orogastric tube placement EXAM: PORTABLE ABDOMEN - 1 VIEW COMPARISON:  October 29, 2016 FINDINGS: Orogastric tube tip and side port are in the stomach. There is slight colon dilatation. There is no small bowel dilatation. No air-fluid levels. No free air. IMPRESSION: Orogastric tube tip and side port in stomach. Suspect a degree of colonic ileus. Obstruction is felt to be less likely. No free air. Electronically Signed   By: Bretta Bang III M.D.   On: 10/31/2016 07:12    Assessment/Plan : Acute Hypoxemic respiratory failure 2/2 CAP + Asp PNA + Pulm edema + demand ischemia - improved. Extubated on 5/2 and doing well.  - S/P 5 days of Azithromycin. Consider switch to augmentin in am if he passes his swallow exam today. Plan for total of 7 days abx since admission.  - cont diuresis with PO lasix - will need swallow evaln today   Acute CHFpEF exacerbation + Moderate AI - cont diuresis with PO lasix.  - cont coreg - He was seeing Cardiology until 2012.  He needs to be plugged back into their office as he has chronic SOB which worsened recently  and has moderate AI.    Fatty Liver - observe LFTs.  - Abd [place] U/R   DM - cont SS insulin     Family :Family updated at length today.  Plan d/w pt and wife.  Transfer to telemetry on 5/3. TRH will be primary starting 5/4.  Dr. Nelson Chimes aware.  PCCM will be off starting 5/4.    Pollie Meyer, MD 11/01/2016, 4:07 PM [facility] Pager [phone] After 3 pm or if no answer, call [phone]

## 2016-11-01 NOTE — Progress Notes (Signed)
Pt vomited a small amount twice.  Pt states it was his acid reflux.  Instructed pt to not eat or drink anything for now.  Will continue to monitor.

## 2016-11-01 NOTE — Progress Notes (Signed)
Patient's wife was sitting on the floor in the hallway on the unit angry with nursing staff regarding bed bugs. Spouse was talked to by nursing leadership regarding the need to protect themselves and other patients against the potential risk of transferring bedbugs. The home is infested and she feels unwelcome and does not want the staff to wear PPE. It was explained that due to the known infestation at home that the staff will wear PPE while providing care.

## 2016-11-01 NOTE — Progress Notes (Signed)
Pt c/o nausea.  NP paged, new orders given.  Will carry out orders and continue to monitor.

## 2016-11-01 NOTE — Progress Notes (Signed)
CSW attempted Patient's wife via T/C to discuss bed bug concerns. HIPAA compliant voice message left requesting return phone call.   CSW has contacted CSW with [facility] who reports that there are no resources for bed bugs but does note that if Patient is a patient of [facility]s Care Management program, they do assist with bed bug extermination in the home. CSW staffed this with RN Case Manager and was informed that Patient does not have [facility] services at this time.   CSW also [place] [place] [facility] [phone]) to inquire about financial assistance with bed bugs. Per, Lupita Leashonna with Social Services, they do not provide assistance with bed bugs at this time.   CSW contacted [employer] [employer]  [phone]). Per Jon GillsAlexis, they do not provide assistance with bed bugs and reports that the Patient would need to contact the city of [place] regarding the bed bug issue but notes that they likely don't provide financial assistance with bed bugs.   CSW awaiting Spouse's return phone call to further discuss cost-effective options such as Bed Bug Bombs/Foggers and various sprays.    Enos FlingAshley Annelisa Ryback, MSW, LCSW [facility]M Clinical Social Worker [phone]

## 2016-11-02 DIAGNOSIS — J9601 Acute respiratory failure with hypoxia: Secondary | ICD-10-CM

## 2016-11-02 DIAGNOSIS — J810 Acute pulmonary edema: Secondary | ICD-10-CM

## 2016-11-02 DIAGNOSIS — N179 Acute kidney failure, unspecified: Secondary | ICD-10-CM

## 2016-11-02 LAB — LEGIONELLA PNEUMOPHILA SEROGP 1 UR AG: L. pneumophila Serogp 1 Ur Ag: NEGATIVE

## 2016-11-02 LAB — BASIC METABOLIC PANEL
Anion gap: 11 (ref 5–15)
BUN: 32 mg/dL — ABNORMAL HIGH (ref 6–20)
CHLORIDE: 100 mmol/L — AB (ref 101–111)
CO2: 33 mmol/L — ABNORMAL HIGH (ref 22–32)
Calcium: 8.4 mg/dL — ABNORMAL LOW (ref 8.9–10.3)
Creatinine, Ser: 1.12 mg/dL (ref 0.61–1.24)
GFR calc non Af Amer: >60 mL/min (ref >60)
Glucose, Bld: 154 mg/dL — ABNORMAL HIGH (ref 65–99)
Potassium: 3.5 mmol/L (ref 3.5–5.1)
SODIUM: 144 mmol/L (ref 135–145)

## 2016-11-02 LAB — CBC
HCT: 37.9 % — ABNORMAL LOW (ref 39.0–52.0)
HEMOGLOBIN: 11.9 g/dL — AB (ref 13.0–17.0)
MCH: 27.9 pg (ref 26.0–34.0)
MCHC: 31.4 g/dL (ref 30.0–36.0)
MCV: 89 fL (ref 78.0–100.0)
Platelets: 322 10*3/uL (ref 150–400)
RBC: 4.26 MIL/uL (ref 4.22–5.81)
RDW: 14.2 % (ref 11.5–15.5)
WBC: 10.4 10*3/uL (ref 4.0–10.5)

## 2016-11-02 LAB — GLUCOSE, CAPILLARY
GLUCOSE-CAPILLARY: 132 mg/dL — AB (ref 65–99)
GLUCOSE-CAPILLARY: 138 mg/dL — AB (ref 65–99)
GLUCOSE-CAPILLARY: 156 mg/dL — AB (ref 65–99)
Glucose-Capillary: 182 mg/dL — ABNORMAL HIGH (ref 65–99)

## 2016-11-02 LAB — CULTURE, RESPIRATORY W GRAM STAIN

## 2016-11-02 LAB — MAGNESIUM: MAGNESIUM: 2.3 mg/dL (ref 1.7–2.4)

## 2016-11-02 LAB — CULTURE, RESPIRATORY
CULTURE: NORMAL
SPECIAL REQUESTS: NORMAL

## 2016-11-02 LAB — PHOSPHORUS: PHOSPHORUS: 4 mg/dL (ref 2.5–4.6)

## 2016-11-02 MED ORDER — FUROSEMIDE 20 MG PO TABS: 20.0000 mg | ORAL_TABLET | Freq: Every day | ORAL | 2 refills | Status: DC

## 2016-11-02 MED ORDER — CARVEDILOL 6.25 MG PO TABS: 6.2500 mg | ORAL_TABLET | Freq: Two times a day (BID) | ORAL | 1 refills | Status: DC

## 2016-11-02 NOTE — Care Management Note (Addendum)
Case Management Note  Patient Details  Name: [name] MRN: [id number] Date of Birth: 10/02/1950  Subjective/Objective:     Admitted with Acute Hypoxic and Hypercarbic Resp Failure             Action/Plan: Patient lives at home with spouse; Patient transferred from the ICU to 3E 11/02/2016; CM talked to patient's spouse, she does not want any HHC for the patient but is agreeable to the rolling walker; RW ordered and to be delivered to the room today prior to discharging home; patient has private insurance with Medicare / Medicaid with prescription drug coverage. PCP is Dr Ginger OrganHavelock; CM talked to patient's nurse that stated that patient did not need home oxygen.  Expected Discharge Date:    11/02/2016              Expected Discharge Plan:  Home w Home Health Services  Discharge planning Services  CM Consult  DME Arranged:  Walker rolling DME Agency:  [facility]  [facility]  [facility]  Status of Service:  In process, will continue to follow  Cherrie DistanceChandler, Shelbia Scinto L, RN 11/02/2016, 11:02 AM

## 2016-11-02 NOTE — Progress Notes (Signed)
  Speech Language Pathology Treatment: Dysphagia  Patient Details Name: [name] MRN: [id number] DOB: 12-22-1950 Today's Date: 11/02/2016 Time: 3570-1779 SLP Time Calculation (min) (ACUTE ONLY): 15 min  Assessment / Plan / Recommendation Clinical Impression  F/u after yesterday's FEES.  Pt apparently with some N/V last night.  He describes chronic reflux.  Observed with consumption of regular solids and liquids today with no clinical s/s of oropharyngeal dysphagia.  Reviewed reflux precautions.  No further SLP needs identified - our services will sign off.  HPI HPI: 66 y.o.malewith fatty liver disease, CAD, DM, HTN, HLD, TIA, anxiety who presented with acute respiratory failure in the setting of right greater than left pulmonary infiltrates, aspiration vs CAP with associated pulmondary edema. Intubated 4/28-5/2.       SLP Plan  All goals met       Recommendations  Diet recommendations: Regular;Thin liquid Liquids provided via: Cup;Straw Medication Administration: Whole meds with liquid Supervision: Patient able to self feed Compensations: Slow rate;Small sips/bites;Follow solids with liquid Postural Changes and/or Swallow Maneuvers: Seated upright 90 degrees                Follow up Recommendations: None SLP Visit Diagnosis: Dysphagia, pharyngoesophageal phase (R13.14) Plan: All goals met       GO                [name] 11/02/2016, 8:00 AM

## 2016-11-02 NOTE — Progress Notes (Signed)
RN reviewed discharge instructions and RX with pt and wife verb understanding. Reviewed extensively about importance of weighing daily, watching sodium intake and reporting any weight gain to PCP/heart doctor. Verb understanding.

## 2016-11-02 NOTE — Progress Notes (Signed)
Physical Therapy Brief Evaluation Note:  Pt is able to perform short distance gait x 20' to door of room. Transfers with supervision to min guard this session. Pt will benefit from HHPT at discharge and a RW. Complete evaluation to follow.   Colin BroachSabra M. Cara Thaxton PT, DPT  [phone]

## 2016-11-02 NOTE — Evaluation (Signed)
Physical Therapy Evaluation Patient Details Name: [name] MRN: [id number] DOB: September 23, 1950 Today's Date: 11/02/2016   History of Present Illness  Ptis a 66 yo male admitted through ED on 10/27/16 with ARF requiring intubation on admission and placed in ICU. Pt failed first weaning attempt and was finally successful on 10/31/16 and trasnferred to 3E on 11/01/16. PMH significant for fatty liver disease, CAD, DM, HTN, HLD, anxiety.   Clinical Impression  Pt presents with the above diagnosis and below deficits for therapy evaluation. Prior to admission, pt lived with his wife in a small single level home and was able to mobilize without an AD. Pt requires Min to Mod A for mobility this session and will require continued acute PT services in order to address the below deficits before DC home. Pt will require stair negotiation prior to discharge.     Follow Up Recommendations Home health PT;Supervision/Assistance - 24 hour    Equipment Recommendations  Rolling walker with 5" wheels    Recommendations for Other Services       Precautions / Restrictions Precautions Precautions: Fall Restrictions Weight Bearing Restrictions: No      Mobility  Bed Mobility Overal bed mobility: Needs Assistance Bed Mobility: Supine to Sit     Supine to sit: Min assist;HOB elevated     General bed mobility comments: Min A to bring trunk upright at EOB  Transfers Overall transfer level: Needs assistance Equipment used: Rolling walker (2 wheeled) Transfers: Sit to/from Stand Sit to Stand: Min assist         General transfer comment: Min A for safety bordering on min guard  Ambulation/Gait Ambulation/Gait assistance: Min guard;Min assist Ambulation Distance (Feet): 20 Feet Assistive device: Rolling walker (2 wheeled) Gait Pattern/deviations: Step-through pattern;Decreased step length - right;Decreased step length - left Gait velocity: decreased Gait velocity interpretation: Below normal speed for  age/gender General Gait Details: slow gait with decreased step length bilaterally, cues for staying within walker during gait.   Stairs            Wheelchair Mobility    Modified Rankin (Stroke Patients Only)       Balance Overall balance assessment: Needs assistance Sitting-balance support: No upper extremity supported;Feet supported Sitting balance-Leahy Scale: Fair     Standing balance support: Bilateral upper extremity supported;During functional activity Standing balance-Leahy Scale: Poor Standing balance comment: relies on RW for stability in standing                             Pertinent Vitals/Pain Pain Assessment: No/denies pain    Home Living Family/patient expects to be discharged to:: Private residence Living Arrangements: Spouse/significant other Available Help at Discharge: Family;Available 24 hours/day Type of Home: House Home Access: Stairs to enter Entrance Stairs-Rails: Right Entrance Stairs-Number of Steps: 3-4 Home Layout: One level Home Equipment: None      Prior Function Level of Independence: Independent         Comments: was completely independent and doing for himself prior to admission     Hand Dominance   Dominant Hand: Right    Extremity/Trunk Assessment   Upper Extremity Assessment Upper Extremity Assessment: Generalized weakness    Lower Extremity Assessment Lower Extremity Assessment: Generalized weakness    Cervical / Trunk Assessment Cervical / Trunk Assessment: Kyphotic  Communication   Communication: No difficulties  Cognition Arousal/Alertness: Awake/alert Behavior During Therapy: WFL for tasks assessed/performed Overall Cognitive Status: Within Functional Limits for tasks assessed  General Comments      Exercises     Assessment/Plan    PT Assessment Patient needs continued PT services  PT Problem List Decreased strength;Decreased  activity tolerance;Decreased balance;Decreased mobility;Decreased knowledge of use of DME       PT Treatment Interventions DME instruction;Gait training;Stair training;Functional mobility training;Therapeutic activities;Therapeutic exercise;Balance training;Patient/family education    PT Goals (Current goals can be found in the Care Plan section)  Acute Rehab PT Goals Patient Stated Goal: to go home today PT Goal Formulation: With patient Time For Goal Achievement: 11/09/16 Potential to Achieve Goals: Good    Frequency Min 3X/week   Barriers to discharge        Co-evaluation               AM-PAC PT "6 Clicks" Daily Activity  Outcome Measure Difficulty turning over in bed (including adjusting bedclothes, sheets and blankets)?: Total Difficulty moving from lying on back to sitting on the side of the bed? : Total Difficulty sitting down on and standing up from a chair with arms (e.g., wheelchair, bedside commode, etc,.)?: A Little Help needed moving to and from a bed to chair (including a wheelchair)?: A Little Help needed walking in hospital room?: A Little Help needed climbing 3-5 steps with a railing? : A Lot 6 Click Score: 13    End of Session Equipment Utilized During Treatment: Gait belt Activity Tolerance: Patient tolerated treatment well Patient left: in bed;with call bell/phone within reach;with family/visitor present Nurse Communication: Mobility status PT Visit Diagnosis: Difficulty in walking, not elsewhere classified (R26.2);Muscle weakness (generalized) (M62.81)    Time: [id number] PT Time Calculation (min) (ACUTE ONLY): 24 min   Charges:   PT Evaluation $PT Eval Moderate Complexity: 1 Procedure PT Treatments $Gait Training: 8-22 mins   PT G Codes:        Colin BroachSabra M. Daniil Labarge PT, DPT  [phone]   Ruel FavorsSabra Aletha HalimMarie Taylormarie Register 11/02/2016, 1:35 PM

## 2016-11-02 NOTE — Discharge Instructions (Signed)
Advance activity slowly Heart Smart Diet ( Low Sodium) Flutter Valve 4 times daily Incentive Spirometry every 2 hours while awake. Call for any new fever of greater than 101.5 Weigh daily and call MD for weight gain of 3 pounds in 24 hours. Follw Up with Kandice RobinsonsSarah Petra Dumler, NP Pulmonary as is scheduled Please follow up with GI to evaluate your fatty liver Please re-establish with cardiology within 1 week of discharge for your heart failure.

## 2016-11-02 NOTE — Progress Notes (Signed)
Pt ambulated approximately 4620ft with 02 sat remaining 93-94% on room air.

## 2016-11-02 NOTE — Discharge Summary (Signed)
Physician Discharge Summary       Patient ID: Harold ManisRodney Waller MRN: [id number] DOB/AGE: 67/11/1950 67 y.o.  Admit date: 10/27/2016 Discharge date: 11/02/2016  Discharge Diagnoses:  Active Problems:   Diabetes mellitus (HCC)   Acute hypoxic and hypercarbic respiratory failure (HCC) in setting of bilateral pulmonary infiltrates. Presume CAP + pulmonary edema +/-ALI   (HFpEF) heart failure with preserved ejection fraction (HCC), pulmonary edema and LBBB of unknown chronicity    Elevated troponin I level, presume demand ischemia    AKI (acute kidney injury) (HCC)   BPH (benign prostatic hyperplasia)   Sepsis (HCC)   Bacteremia due to Escherichia coli   Fatty liver   Acute pulmonary edema (HCC)   Community acquired pneumonia   History of Present Illness: Harold GowdaRodney Penleyis a 66 y.o.malewith fatty liver disease, CAD, DM, HTN, HLD, anxiety who [facility]  [name] was chronically short of breath on exertion but was able to walk around the grocery store without any problems yesterday. Today, he was much more out of breath at rest than usual despite not having fevers, chills, a cough. He had worse swelling in his legs and a rash from fleas, which he is taking hydroxyzine for. EMS was called and found him in extremis complaining of chest pain, shortness of breath. He was found to have a wide complex tachycardia and due to signs of poor perfusion was given a dose of synchronized cardioversion to no effect. He also was given a full aspirin and a sublingual nitroglycerin. On arrival to the ED, his oxygen saturations remained critically low despite NRB and BiPAP so the decision was made to intubate him.He was extubated 10/31/16 after being diuresed,he was  transferred to the floor 5/3 with continued diuresis and was deemed  medically stable on RA , passed swallow evaluation, and ready for discharge 5/4. He will require continued lasix and  monitoring of his renal status. He will need Ambulatory saturation prior to discharge to determine need for oxygen at home.  Hospital Course:  4/28 - acute respiratory failure requiring intubation; admission to ICU, getting abx and diuresis  4/30 CXR - significant improvement in bil infiltrates ECHO: ER reduced to 45-50% doppler parameters c/w elevated end-diastolic filling pressures abd US 4/30: c/w fatty liver, no gallstones, 6mm polyp, no cholecystitis  5/1 weaning on PSV. Stopped weaning efforts d/t anxiety and WOB.  Changed to precedex. Decreased lasix 5/2 passed SBT and extubated 5/3 transfer to floor and to [facility] service. 11/02/2016: Discharge home  CULTURES: BCx 4/28>> BCx 4/28>> E coli RVP 4/29: neg U strept 4/28: neg U legionella 4/28>>>neg Sputum 5/2 few GPC>>>  ANTIBIOTICS: Azithromyin 4/28>>>5/1 Ceftriaxone 4/28>>  Discharge Plan by active problems   Acute Hypoxic hypercarbic and hypoxic respiratory failure in setting of R>L pulmonary infiltrates aspiration vs CAP w/ associated pulmonary edema +/- ALI.  O2 as needed to maintain oxygen saturations > 93%.  Plan Ambulatory saturation prior to discharge IS and Flutter valve 4 times daily at home Home oxygen if desaturates below 88% Total of 7 days antibiotics completed in hospital IS and flutter at home 4 times daily Continue home lasix as prescribed Follow up appointment with Pulmonary 5/14 at 10 am as scheduled. .   HFpEF exacerbation w/ associated pulmonary edema LBBB felt chronic Troponin trended flat. Felt demand ischemia  Plan Home on Lasix 20 mg daily Potassium 20 mg daily  Will need BMET within 1 week of  discharge Weights daily at home Asa and atorvostatin Cont coreg 6.25 bid Referral to cards for heart failure clinic( Was seen previously but not since 2012)  AKI.improved after dropping lasix dose Labs (Brief)       Lab Results  Component Value Date   CREATININE 1.11 11/01/2016   CREATININE  1.06 10/31/2016   CREATININE 1.35 (H) 10/30/2016          Filed Weights   10/30/16 0406 10/31/16 0413 11/01/16 0400  Weight: 226 lb 13.7 oz (102.9 kg) 221 lb 9 oz (100.5 kg) 220 lb 0.3 oz (99.8 kg)    Plan Cont current lasix and BP management  Creatinine 1.12 at discharge Will need BMET within 1 week of discharge  BPH Plan  resume proscar and flomax  Fatty Liver   Plan Referral post discharge to GI for fatty liver disease  DM type 2 w/ hyperglycemia CBG (last 3)   Recent Labs (last 2 labs)    Recent Labs  11/01/16 0001 11/01/16 0356 11/01/16 0822  GLUCAP 151* 154* 156*       Plan Continue home DM medications per discharge summary   Consults  Pulmonary( Appointment 11/12/2016 ON:GEXBMW up Fatty Liver Cardiology:Follow Up Heart Failure  Discharge Exam: BP (!) 142/74 (BP Location: Right Arm)   Pulse 84   Temp 98.7 F (37.1 C) (Oral)   Resp 18   Ht 5\' 7"  (1.702 m)   Wt 216 lb 8 oz (98.2 kg) Comment: scale a  SpO2 93%   BMI 33.91 kg/m   Physical Exam:  General- No distress,  A&Ox 3, obese male on RA with saturations 93% ENT: No sinus tenderness, TM clear, pale nasal mucosa, no oral exudate,no post nasal drip, no LAN, MM pink and moist, normocephalic, atraumatic Cardiac: S1, S2, regular rate and rhythm, no murmur Chest: No wheeze/fine crackles per bases, / no dullness; no accessory muscle use, no nasal flaring, no sternal retractions, diminished per bases,  Abd.: Soft Non-tender, obese Ext: No clubbing cyanosis, edema,pigment changes 2/2 chronic edema Neuro:  Deconditioned  Skin: No rashes, warm and dry, intact, no lesions noted Psych: normal mood and behavior   Labs at discharge Lab Results  Component Value Date   CREATININE 1.12 11/02/2016   BUN 32 (H) 11/02/2016   NA 144 11/02/2016   K 3.5 11/02/2016   CL 100 (L) 11/02/2016   CO2 33 (H) 11/02/2016   Lab Results  Component Value Date   WBC 10.4 11/02/2016   HGB 11.9 (L)  11/02/2016   HCT 37.9 (L) 11/02/2016   MCV 89.0 11/02/2016   PLT 322 11/02/2016   Lab Results  Component Value Date   ALT 34 10/27/2016   AST 30 10/27/2016   ALKPHOS 160 (H) 10/27/2016   BILITOT 0.5 10/27/2016   Lab Results  Component Value Date   INR 1.02 05/04/2015   INR 1.02 10/10/2013   INR 0.96 03/22/2011    Current radiology studies 5/2 CXR Low lung volumes. Persistent bibasilar infiltrates. Small bilateral pleural effusions. Findings are unchanged from prior exam. Cardiomegaly again noted.   Disposition:  Discharged home in good condition, ambulating and maintaining saturations > 93 %.  Allergies as of 11/02/2016      Reactions   Cashew Nut Oil Palpitations   Percocet [oxycodone-acetaminophen] Other (See Comments)   Hallucintaions      Medication List    TAKE these medications   aspirin EC 81 MG tablet Take 81 mg by mouth daily.   atorvastatin 20  MG tablet Commonly known as:  LIPITOR Take 20 mg by mouth every evening.   carvedilol 6.25 MG tablet Commonly known as:  COREG Take 1 tablet (6.25 mg total) by mouth 2 (two) times daily with a meal.   finasteride 5 MG tablet Commonly known as:  PROSCAR Take 5 mg by mouth daily.   furosemide 20 MG tablet Commonly known as:  LASIX Take 1 tablet (20 mg total) by mouth daily. Start taking on:  11/03/2016   gabapentin 300 MG capsule Commonly known as:  NEURONTIN Take 300 mg by mouth 4 (four) times daily. Takes 2 in the morning and 1 at night   glipiZIDE 10 MG 24 hr tablet Commonly known as:  GLUCOTROL XL Take 10 mg by mouth daily with breakfast.   hydrOXYzine 50 MG capsule Commonly known as:  VISTARIL Take 50 mg by mouth 3 (three) times daily.   metFORMIN 500 MG 24 hr tablet Commonly known as:  GLUCOPHAGE-XR Take 500 mg by mouth 2 (two) times daily.   montelukast 10 MG tablet Commonly known as:  SINGULAIR Take 10 mg by mouth at bedtime.   pantoprazole 40 MG tablet Commonly known as:   PROTONIX Take 1 tablet (40 mg total) by mouth daily.   Potassium Chloride ER 20 MEQ Tbcr Take 20 mEq by mouth daily.   tamsulosin 0.4 MG Caps capsule Commonly known as:  FLOMAX Take 0.4 mg by mouth daily.            Durable Medical Equipment        Start     Ordered   11/02/16 1059  For home use only DME Walker rolling  Once    Question:  Patient needs a walker to treat with the following condition  Answer:  Acute respiratory failure (HCC)   11/02/16 1059       Discharged Condition:  Discharged in good condition  Greater than 35 minutes of time have been dedicated to discharge assessment, planning and discharge instructions.   Signed: Bevelyn Ngo, AGACNP-BC [facility] Pulmonary[facility]Critical Care Medicine Pager # [phone] , or [phone]  Spent >35 minutes on discharge planning related activities.  Patient seen and examined, agree with above note.  I dictated the care and orders written for this patient under my direction.  Alyson Reedy, MD [phone]

## 2016-11-03 LAB — CULTURE, BLOOD (ROUTINE X 2)
CULTURE: NO GROWTH
Culture: NO GROWTH
SPECIAL REQUESTS: ADEQUATE
Special Requests: ADEQUATE

## 2016-11-12 ENCOUNTER — Ambulatory Visit (INDEPENDENT_AMBULATORY_CARE_PROVIDER_SITE_OTHER): Payer: Medicare Other | Admitting: Acute Care

## 2016-11-12 ENCOUNTER — Encounter: Payer: Self-pay | Admitting: Acute Care

## 2016-11-12 ENCOUNTER — Ambulatory Visit (INDEPENDENT_AMBULATORY_CARE_PROVIDER_SITE_OTHER)
Admission: RE | Admit: 2016-11-12 | Discharge: 2016-11-12 | Disposition: A | Discharge status: Home / Self Care | Payer: Medicare Other | Source: Ambulatory Visit | Attending: Acute Care | Admitting: Acute Care

## 2016-11-12 ENCOUNTER — Other Ambulatory Visit: Payer: Self-pay

## 2016-11-12 VITALS — BP 120/72 | HR 96 | Ht 67.0 in | Wt 221.0 lb

## 2016-11-12 DIAGNOSIS — J189 Pneumonia, unspecified organism: Secondary | ICD-10-CM | POA: Diagnosis not present

## 2016-11-12 DIAGNOSIS — J810 Acute pulmonary edema: Secondary | ICD-10-CM | POA: Diagnosis not present

## 2016-11-12 DIAGNOSIS — J449 Chronic obstructive pulmonary disease, unspecified: Secondary | ICD-10-CM | POA: Diagnosis not present

## 2016-11-12 DIAGNOSIS — J9601 Acute respiratory failure with hypoxia: Secondary | ICD-10-CM

## 2016-11-12 MED ORDER — FLUTTER DEVI: 0 refills | Status: DC

## 2016-11-12 NOTE — Assessment & Plan Note (Addendum)
Continued interval  improvement on CXR Denies any dyspnea or SOB, fever of purulent sputum Plan: Continue using Incentive Spirometry 6 times daily Flutter valve 4 times daily Claritin 10 mg daily for PND. Add Flonase for nasal stuffiness. Nasal Saline for nasal stuffiness. Follow up in 3 months with MD to evaluate for suspected COPD on imaging with PFT's prior.

## 2016-11-12 NOTE — Patient Instructions (Addendum)
It is good to see you again today. I am suggesting that you see Dr. Excell Seltzerooper again to re-evaluate need for cardiac follow up based on recent admission for pulmonary edema  CXR shows continued interval improvement. Continue Lasix daily Claritin 10 mg daily for PND. Add Flonase for nasal stuffiness. Nasal Saline for nasal stuffiness. Continue using your Flutter valve and Incentive Spirometer. We will prescribe a flutter valve today. 3 month follow up with MD with PFT's prior. Please contact office for sooner follow up if symptoms do not improve or worsen or seek emergency care

## 2016-11-12 NOTE — Progress Notes (Addendum)
History of Present Illness [name] is a 66 y.o. male never smoker with recent hospital admission  from 10/27/16-11/02/2016 for aspiration pneumonia vs. CAP, and diastolic heart failure . He was seen by Dr, Christene Slates in the hospital.    11/12/2016 Hospital Follow up: Pt. Presents for hospital Follow up.He was admitted 4/28-5/4 for acute hypoxic respiratory failure in setting of bilateral pulmonary infiltrates, presumed CAP= pulmonary edema +/- ALI He was intubated and treated with antibiotics, IV steroids, Diuretics and scheduled BD's. He was discharged home in good condition. He presents today for follow up.  He states he is doing well.He does state he has continued fatigue after discharge. He has been able to stop using his walker. He states he has been able to do some of his chores.He has not been using any nebulizer treatments. He does have a cough, which is non-productive.He states he has some nasal congestion and some post nasal drip. He is not taking anything for allergies.He denies any fever, chest pain, orthopnea or hemoptysis.   Test Results: CXR 11/12/2016  COPD. Residual atelectasis or scarring at both lung bases. Mild chronic interstitial prominence likely reflects an element of pulmonary fibrosis.  Stable cardiomegaly with decreased interstitial edema and pulmonary vascular congestion. Hospital Course:  4/28 - acute respiratory failure requiring intubation; admission to ICU, getting abx and diuresis  4/30 CXR - significant improvement in bil infiltrates ECHO: ED reduced to 45-50% doppler parameters c/w elevated end-diastolic filling pressures abd [place] 4/30: c/w fatty liver, no gallstones, 6mm polyp, no cholecystitis  5/1 weaning on PSV. Stopped weaning efforts d/t anxiety and WOB. Changed to precedex. Decreased lasix 5/2 passed SBT and extubated 5/3 transfer to floor and to [place] service. 11/02/2016: Discharge home  CULTURES: BCx 4/28>> BCx 4/28>> E coli RVP 4/29:  neg U strept 4/28: neg U legionella 4/28>>>neg Sputum 5/2 few GPC>>>  ANTIBIOTICS: Azithromyin 4/28>>>5/1 Ceftriaxone 4/28>> 10/30/2016  CBC Latest Ref Rng & Units 11/02/2016 11/01/2016 10/31/2016  WBC 4.0 - 10.5 K/uL 10.4 10.3 9.0  Hemoglobin 13.0 - 17.0 g/dL 11.9(L) 11.5(L) 11.0(L)  Hematocrit 39.0 - 52.0 % 37.9(L) 36.6(L) 34.2(L)  Platelets 150 - 400 K/uL 322 314 257    BMP Latest Ref Rng & Units 11/02/2016 11/01/2016 10/31/2016  Glucose 65 - 99 mg/dL 161(W) 960(A) 540(J)  BUN 6 - 20 mg/dL 81(X) 91(Y) 78(G)  Creatinine 0.61 - 1.24 mg/dL 9.56 2.13 0.86  Sodium 135 - 145 mmol/L 144 143 142  Potassium 3.5 - 5.1 mmol/L 3.5 3.7 3.8  Chloride 101 - 111 mmol/L 100(L) 102 103  CO2 22 - 32 mmol/L 33(H) 29 29  Calcium 8.9 - 10.3 mg/dL 5.7(Q) 4.6(N) 8.1(L)    BNP    Component Value Date/Time   BNP 495.2 (H) 10/27/2016 2022    ProBNP    Component Value Date/Time   PROBNP 178.3 (H) 10/10/2013 0849    PFT No results found for: FEV1PRE, FEV1POST, FVCPRE, FVCPOST, TLC, DLCOUNC, PREFEV1FVCRT, PSTFEV1FVCRT  Dg Chest 2 View  Result Date: 11/12/2016 CLINICAL DATA:  Follow-up recent hospitalization for pneumonia. No current complaints. History of emphysema, valvular heart disease and acute and chronic respiratory failure. EXAM: CHEST  2 VIEW COMPARISON:  Portable chest x-ray of Oct 31, 2016 FINDINGS: The lungs remain well-expanded. The interstitial markings remain increased but have improved. Linear density at both bases is consistent with atelectasis or residual infiltrate. The cardiac silhouette is enlarged. The pulmonary vascularity is not clearly engorged. There is tortuosity of the ascending and descending thoracic  aorta with mural calcification. Old rib deformities on the left are present. There is gentle curvature of the mid thoracic spine convex toward the right. There is mild multilevel degenerative disc disease. IMPRESSION: COPD. Residual atelectasis or scarring at both lung bases. Mild chronic  interstitial prominence likely reflects an element of pulmonary fibrosis. Stable cardiomegaly with decreased interstitial edema and pulmonary vascular congestion. Thoracic aortic atherosclerosis. Electronically Signed   By: David  [place] M.D.   On: 11/12/2016 10:12   US Abdomen Complete  Result Date: 10/30/2016 CLINICAL DATA:  Abdominal pain. EXAM: ABDOMEN ULTRASOUND COMPLETE COMPARISON:  [facility] 04/30/ 2018.  Ultrasound 01/19/2008. FINDINGS: Gallbladder: No gallstones. 6 mm gallbladder polyp noted on today's exam. This previously measured 5 mm on prior exam. A new 3 mm polyp noted on today's exam . Gallbladder wall thickness 1.3 mm. Negative Murphy sign. Common bile duct: Diameter: 3 mm. Liver: Increased echogenicity of the liver again noted consistent with fatty infiltration. No focal hepatic abnormality. IVC: No abnormality visualized. Pancreas: Visualized portion unremarkable. Spleen: Size and appearance within normal limits. Right Kidney: Length: 11.7 cm. Echogenicity within normal limits. No mass or hydronephrosis visualized. Left Kidney: Length: 0.7 cm. Echogenicity within normal limits. No mass or hydronephrosis visualized. Abdominal aorta: Mild ectasia at 2.5 cm. Other findings: None. IMPRESSION: 1. No gallstones. 6 mm gallbladder polyp noted on today's exam. This previously measured 5 mm on prior ultrasound of 01/19/2008. A new 3 mm polyp noted on today's exam. No evidence of cholecystitis. No biliary distention. 2. Increased echogenicity of the liver again noted consistent with fatty infiltration. 3. Mild abdominal aortic ectasia 2.5 cm. Ectatic abdominal aorta at risk for aneurysm development. Recommend followup by ultrasound in 5 years. This recommendation follows ACR consensus guidelines: White Paper of the ACR Incidental Findings Committee II on Vascular Findings. J Am Coll Radiol 2013; 10:789-794. Electronically Signed   By: Maisie Fus  Register   On: 10/30/2016 06:31   Dg Chest Port 1 View  Result  Date: 10/31/2016 CLINICAL DATA:  Pneumonia.  Shortness of breath. EXAM: PORTABLE CHEST 1 VIEW COMPARISON:  10/30/2016. FINDINGS: Endotracheal tube and NG tube in stable position. Stable cardiomegaly. Low lung volumes. Persistent bibasilar infiltrates without interim change. Small bilateral pleural effusions. No pneumothorax. IMPRESSION: 1. Lines and tubes in stable position. 2. Low lung volumes. Persistent bibasilar infiltrates. Small bilateral pleural effusions. Findings are unchanged from prior exam. 3.  Cardiomegaly again noted. Electronically Signed   By: Maisie Fus  Register   On: 10/31/2016 07:14   Dg Chest Port 1 View  Result Date: 10/30/2016 CLINICAL DATA:  Respiratory failure, shortness of breath, intubated patient. EXAM: PORTABLE CHEST 1 VIEW COMPARISON:  Portable chest x-ray of October 29, 2016 FINDINGS: The lungs are reasonably well inflated. The patient's hand overlies the right apex. The interstitial markings remain coarse. Bibasilar densities are stable. There is no large pleural effusion and no visible pneumothorax. The cardiac silhouette remains enlarged. The pulmonary vascularity is not engorged. The endotracheal tube tip lies approximately 2.8 cm above the carina. The esophagogastric tube tip projects below the inferior margin of the image. IMPRESSION: Allowing for differences in positioning there has not been significant interval change since a previous study. There is bibasilar atelectasis or pneumonia. There is stable cardiomegaly without significant pulmonary vascular congestion. The support tubes are in reasonable position. Electronically Signed   By: David  [place] M.D.   On: 10/30/2016 07:11   Dg Chest Port 1 View  Result Date: 10/29/2016 CLINICAL DATA:  Respiratory failure EXAM: PORTABLE CHEST  1 VIEW COMPARISON:  10/27/2016 FINDINGS: Cardiac shadow is enlarged. Endotracheal tube and nasogastric catheter noted in satisfactory position. Significant interval clearing is noted in the right lung  although persistent right basilar infiltrate is seen. Mild left basilar infiltrate is noted as well. No bony abnormality is seen. IMPRESSION: Significant improved aeration in the right lung although persistent bibasilar infiltrates are seen. Electronically Signed   By: Alcide Clever M.D.   On: 10/29/2016 07:03   Dg Chest Portable 1 View  Result Date: 10/27/2016 CLINICAL DATA:  Status post intubation and respiratory failure EXAM: PORTABLE CHEST 1 VIEW COMPARISON:  10/10/2013 FINDINGS: Cardiac shadow is enlarged. Endotracheal tube is noted in satisfactory position. Nasogastric catheter has been placed but lies in the proximal esophagus at the level of the thoracic inlet. This raises suspicion for coiling within the posterior rings. Significant densities noted throughout the right lung likely related to asymmetric pulmonary edema. Mild patchy changes are noted in the left lung base. IMPRESSION: Endotracheal tube in satisfactory position. Significant increased density particularly within the right lung consistent with pulmonary edema. Some left basilar atelectatic changes are noted. Gastric catheter is noted within the proximal esophagus. This raises suspicion for coiling proximally. Should be withdrawn and readvanced. Electronically Signed   By: Alcide Clever M.D.   On: 10/27/2016 20:41   Dg Abd Portable 1v  Result Date: 10/31/2016 CLINICAL DATA:  Orogastric tube placement EXAM: PORTABLE ABDOMEN - 1 VIEW COMPARISON:  October 29, 2016 FINDINGS: Orogastric tube tip and side port are in the stomach. There is slight colon dilatation. There is no small bowel dilatation. No air-fluid levels. No free air. IMPRESSION: Orogastric tube tip and side port in stomach. Suspect a degree of colonic ileus. Obstruction is felt to be less likely. No free air. Electronically Signed   By: Bretta Bang III M.D.   On: 10/31/2016 07:12   Dg Abd Portable 1v  Result Date: 10/29/2016 CLINICAL DATA:  Confirm orogastric tube placement  EXAM: PORTABLE ABDOMEN - 1 VIEW COMPARISON:  Yesterday FINDINGS: An orogastric tube tip is at the descending duodenum. Nonobstructive bowel gas pattern. Lung opacities, dedicated chest x-ray from earlier today. IMPRESSION: Orogastric tube tip at the descending duodenum. Electronically Signed   By: Marnee Spring M.D.   On: 10/29/2016 15:29   Dg Abd Portable 1v  Result Date: 10/28/2016 CLINICAL DATA:  Orogastric tube placement EXAM: PORTABLE ABDOMEN - 1 VIEW COMPARISON:  June 25, 2007 FINDINGS: Orogastric tube tip and side port are in the stomach. There is stool throughout colon. There is somewhat of a paucity of small bowel gas. There is no bowel dilatation or air-fluid level to suggest bowel obstruction. No evident free air. IMPRESSION: Orogastric tube tip and side port in stomach. Relative paucity of small bowel gas. This finding may be seen normally but also may be seen with enteritis or early ileus. Bowel obstruction not felt to be likely. No free air. Electronically Signed   By: Bretta Bang III M.D.   On: 10/28/2016 07:29     Past medical hx Past Medical History:  Diagnosis Date  . 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 Diabetes mellitus 10/17/2011   newly dx today  . Edema leg    right leg has leaky valve and right foot swells  . Emphysema   . Excessive ear wax   . Fatty liver   . Hernia    near navel  . Hyperlipidemia   .  Hypertension   . Leaky heart valve   . Neuropathy   . Rheumatic fever   . Slow urinary stream   . TIA (transient ischemic attack)      Social History  Substance Use Topics  . Smoking status: Never Smoker  . Smokeless tobacco: Never Used  . Alcohol use No    Tobacco Cessation: Pt. Is a never smoker.  Past surgical hx, Family hx, Social hx all reviewed.  Current Outpatient Prescriptions on File Prior to Visit  Medication Sig  . aspirin EC 81 MG tablet Take 81 mg by mouth daily.  Marland Kitchen. atorvastatin  (LIPITOR) 20 MG tablet Take 20 mg by mouth every evening.   . carvedilol (COREG) 6.25 MG tablet Take 1 tablet (6.25 mg total) by mouth 2 (two) times daily with a meal.  . finasteride (PROSCAR) 5 MG tablet Take 5 mg by mouth daily.  . furosemide (LASIX) 20 MG tablet Take 1 tablet (20 mg total) by mouth daily.  Marland Kitchen. gabapentin (NEURONTIN) 300 MG capsule Take 300 mg by mouth 4 (four) times daily. Takes 2 in the morning and 1 at night  . glipiZIDE (GLUCOTROL XL) 10 MG 24 hr tablet Take 10 mg by mouth daily with breakfast.  . hydrOXYzine (VISTARIL) 50 MG capsule Take 50 mg by mouth 3 (three) times daily.  . metFORMIN (GLUCOPHAGE-XR) 500 MG 24 hr tablet Take 500 mg by mouth 2 (two) times daily.  . montelukast (SINGULAIR) 10 MG tablet Take 10 mg by mouth at bedtime.  . pantoprazole (PROTONIX) 40 MG tablet Take 1 tablet (40 mg total) by mouth daily.  . Potassium Chloride ER 20 MEQ TBCR Take 20 mEq by mouth daily.  . tamsulosin (FLOMAX) 0.4 MG CAPS capsule Take 0.4 mg by mouth daily.   No current facility-administered medications on file prior to visit.      Allergies  Allergen Reactions  . Cashew Nut Oil Palpitations  . Percocet [Oxycodone-Acetaminophen] Other (See Comments)    Hallucintaions    Review Of Systems:  Constitutional:   No  weight loss, night sweats,  Fevers, chills, +fatigue, or  lassitude.  HEENT:   No headaches,  Difficulty swallowing,  Tooth/dental problems, or  Sore throat,                No sneezing, itching, ear ache, nasal congestion, post nasal drip,   CV:  No chest pain,  Orthopnea, PND, + swelling in lower extremities, no anasarca, dizziness, palpitations, syncope.   GI  No heartburn, indigestion, abdominal pain, nausea, vomiting, diarrhea, change in bowel habits, loss of appetite, bloody stools.   Resp: No shortness of breath with exertion or at rest.  No excess mucus, no productive cough,  + non-productive cough,  No coughing up of blood.  No change in color of mucus.   No wheezing.  No chest wall deformity  Skin: no rash or lesions.  GU: no dysuria, change in color of urine, no urgency or frequency.  No flank pain, no hematuria   MS:  No joint pain or swelling.  No decreased range of motion.  No back pain.  Psych:  No change in mood or affect. No depression or anxiety.  No memory loss.   Vital Signs BP 120/72 (BP Location: Left Arm, Cuff Size: Normal)   Pulse 96   Ht 5\' 7"  (1.702 m)   Wt 221 lb (100.2 kg)   SpO2 96%   BMI 34.61 kg/m    Physical Exam:  General- No  distress,  A&Ox3, elderly obese male. ENT: No sinus tenderness, TM clear, edematous  nasal mucosa, no oral exudate,+ post nasal drip, no LAN Cardiac: S1, S2, regular rate and rhythm, no murmur Chest: No wheeze/ rales/ dullness; no accessory muscle use, no nasal flaring, no sternal retractions, diminished per bases. Abd.: Soft Non-tender, BS +, Obese Ext: No clubbing cyanosis, 2+ Bilateral LE edema Neuro:  normal strength, but deconditioned at baseline Skin: No rashes, warm and dry, changes of LE venous stasis Psych: normal mood and behavior   Assessment/Plan  Community acquired pneumonia Continued interval  improvement on CXR Denies any dyspnea or SOB, fever of purulent sputum Plan: Continue using Incentive Spirometry 6 times daily Flutter valve 4 times daily Claritin 10 mg daily for PND. Add Flonase for nasal stuffiness. Nasal Saline for nasal stuffiness. Follow up in 3 months with MD to evaluate for suspected COPD on imaging with PFT's prior.  Acute pulmonary edema (HCC) Improved on CXR today, but continued vascular congestion Compliant with daily Lasix To see PCP 11/13/16 Wife does not want to see cards for follow up until she has spoken with PCP. She does not feel pt. Has heart failure. 2+ Bilateral LE edema today in the office Plan: Continue to take lasix daily Follow up management and  Labs per PCP Needs at least 1 appointment with Cards for evaluation post  hospitalization, but wife states she will make decision.    Bevelyn Ngo, NP 11/12/2016  10:44 AM

## 2016-11-12 NOTE — Assessment & Plan Note (Signed)
Improved on CXR today, but continued vascular congestion Compliant with daily Lasix To see PCP 11/13/16 Wife does not want to see cards for follow up until she has spoken with PCP. She does not feel pt. Has heart failure. 2+ Bilateral LE edema today in the office Plan: Continue to take lasix daily Follow up management and  Labs per PCP Needs at least 1 appointment with Cards for evaluation post hospitalization, but wife states she will make decision.

## 2016-11-12 NOTE — Progress Notes (Signed)
Chart and office note reviewed in detail along with available xrays/ labs > agree with a/p as outlined  

## 2017-02-26 ENCOUNTER — Ambulatory Visit: Payer: Self-pay | Admitting: Pulmonary Disease

## 2017-03-15 ENCOUNTER — Ambulatory Visit: Payer: Self-pay | Admitting: Pulmonary Disease

## 2017-10-14 ENCOUNTER — Inpatient Hospital Stay (HOSPITAL_COMMUNITY)
Admission: EM | Admit: 2017-10-14 | Discharge: 2017-10-18 | DRG: 871 | Disposition: A | Discharge status: Home / Self Care | Payer: Medicare Other | Attending: Internal Medicine | Admitting: Internal Medicine

## 2017-10-14 ENCOUNTER — Other Ambulatory Visit: Payer: Self-pay

## 2017-10-14 ENCOUNTER — Emergency Department (HOSPITAL_COMMUNITY): Payer: Medicare Other

## 2017-10-14 DIAGNOSIS — K219 Gastro-esophageal reflux disease without esophagitis: Secondary | ICD-10-CM | POA: Diagnosis present

## 2017-10-14 DIAGNOSIS — Z8673 Personal history of transient ischemic attack (TIA), and cerebral infarction without residual deficits: Secondary | ICD-10-CM

## 2017-10-14 DIAGNOSIS — N401 Enlarged prostate with lower urinary tract symptoms: Secondary | ICD-10-CM | POA: Diagnosis present

## 2017-10-14 DIAGNOSIS — K760 Fatty (change of) liver, not elsewhere classified: Secondary | ICD-10-CM | POA: Diagnosis present

## 2017-10-14 DIAGNOSIS — Z7982 Long term (current) use of aspirin: Secondary | ICD-10-CM

## 2017-10-14 DIAGNOSIS — G9341 Metabolic encephalopathy: Secondary | ICD-10-CM | POA: Diagnosis present

## 2017-10-14 DIAGNOSIS — R339 Retention of urine, unspecified: Secondary | ICD-10-CM | POA: Diagnosis present

## 2017-10-14 DIAGNOSIS — E785 Hyperlipidemia, unspecified: Secondary | ICD-10-CM | POA: Diagnosis present

## 2017-10-14 DIAGNOSIS — B9710 Unspecified enterovirus as the cause of diseases classified elsewhere: Secondary | ICD-10-CM | POA: Diagnosis present

## 2017-10-14 DIAGNOSIS — R0902 Hypoxemia: Secondary | ICD-10-CM | POA: Diagnosis not present

## 2017-10-14 DIAGNOSIS — E872 Acidosis: Secondary | ICD-10-CM | POA: Diagnosis present

## 2017-10-14 DIAGNOSIS — J9601 Acute respiratory failure with hypoxia: Secondary | ICD-10-CM | POA: Diagnosis present

## 2017-10-14 DIAGNOSIS — E1142 Type 2 diabetes mellitus with diabetic polyneuropathy: Secondary | ICD-10-CM | POA: Diagnosis present

## 2017-10-14 DIAGNOSIS — A419 Sepsis, unspecified organism: Principal | ICD-10-CM | POA: Diagnosis present

## 2017-10-14 DIAGNOSIS — I110 Hypertensive heart disease with heart failure: Secondary | ICD-10-CM | POA: Diagnosis present

## 2017-10-14 DIAGNOSIS — Z825 Family history of asthma and other chronic lower respiratory diseases: Secondary | ICD-10-CM

## 2017-10-14 DIAGNOSIS — R338 Other retention of urine: Secondary | ICD-10-CM | POA: Diagnosis not present

## 2017-10-14 DIAGNOSIS — Z8249 Family history of ischemic heart disease and other diseases of the circulatory system: Secondary | ICD-10-CM

## 2017-10-14 DIAGNOSIS — I10 Essential (primary) hypertension: Secondary | ICD-10-CM | POA: Diagnosis not present

## 2017-10-14 DIAGNOSIS — R410 Disorientation, unspecified: Secondary | ICD-10-CM | POA: Diagnosis not present

## 2017-10-14 DIAGNOSIS — I5022 Chronic systolic (congestive) heart failure: Secondary | ICD-10-CM | POA: Diagnosis present

## 2017-10-14 DIAGNOSIS — Z7984 Long term (current) use of oral hypoglycemic drugs: Secondary | ICD-10-CM

## 2017-10-14 DIAGNOSIS — E1165 Type 2 diabetes mellitus with hyperglycemia: Secondary | ICD-10-CM | POA: Diagnosis present

## 2017-10-14 DIAGNOSIS — R69 Illness, unspecified: Secondary | ICD-10-CM

## 2017-10-14 DIAGNOSIS — Z79899 Other long term (current) drug therapy: Secondary | ICD-10-CM | POA: Diagnosis not present

## 2017-10-14 DIAGNOSIS — E119 Type 2 diabetes mellitus without complications: Secondary | ICD-10-CM

## 2017-10-14 DIAGNOSIS — B9789 Other viral agents as the cause of diseases classified elsewhere: Secondary | ICD-10-CM | POA: Diagnosis present

## 2017-10-14 DIAGNOSIS — J111 Influenza due to unidentified influenza virus with other respiratory manifestations: Secondary | ICD-10-CM | POA: Insufficient documentation

## 2017-10-14 LAB — URINALYSIS, ROUTINE W REFLEX MICROSCOPIC
Bacteria, UA: NONE SEEN
Bilirubin Urine: NEGATIVE
KETONES UR: NEGATIVE mg/dL
Leukocytes, UA: NEGATIVE
Nitrite: NEGATIVE
PH: 5 (ref 5.0–8.0)
Protein, ur: NEGATIVE mg/dL
Specific Gravity, Urine: 1.01 (ref 1.005–1.030)
Squamous Epithelial / LPF: NONE SEEN
WBC, UA: NONE SEEN WBC/hpf (ref 0–5)

## 2017-10-14 LAB — CBC WITH DIFFERENTIAL/PLATELET
Basophils Absolute: 0.1 10*3/uL (ref 0.0–0.1)
Basophils Relative: 0 %
EOS ABS: 0.3 10*3/uL (ref 0.0–0.7)
EOS PCT: 2 %
HCT: 34.2 % — ABNORMAL LOW (ref 39.0–52.0)
Hemoglobin: 11 g/dL — ABNORMAL LOW (ref 13.0–17.0)
LYMPHS ABS: 1.1 10*3/uL (ref 0.7–4.0)
Lymphocytes Relative: 10 %
MCH: 27.7 pg (ref 26.0–34.0)
MCHC: 32.2 g/dL (ref 30.0–36.0)
MCV: 86.1 fL (ref 78.0–100.0)
Monocytes Absolute: 0.4 10*3/uL (ref 0.1–1.0)
Monocytes Relative: 4 %
Neutro Abs: 9.3 10*3/uL — ABNORMAL HIGH (ref 1.7–7.7)
Neutrophils Relative %: 84 %
PLATELETS: 255 10*3/uL (ref 150–400)
RBC: 3.97 MIL/uL — AB (ref 4.22–5.81)
RDW: 13.8 % (ref 11.5–15.5)
WBC: 11.2 10*3/uL — AB (ref 4.0–10.5)

## 2017-10-14 LAB — I-STAT CG4 LACTIC ACID, ED
LACTIC ACID, VENOUS: 1.6 mmol/L (ref 0.5–1.9)
Lactic Acid, Venous: 2.66 mmol/L (ref 0.5–1.9)

## 2017-10-14 LAB — COMPREHENSIVE METABOLIC PANEL
ALK PHOS: 111 U/L (ref 38–126)
ALT: 18 U/L (ref 17–63)
AST: 17 U/L (ref 15–41)
Albumin: 3.3 g/dL — ABNORMAL LOW (ref 3.5–5.0)
Anion gap: 12 (ref 5–15)
BUN: 18 mg/dL (ref 6–20)
CALCIUM: 8.4 mg/dL — AB (ref 8.9–10.3)
CHLORIDE: 97 mmol/L — AB (ref 101–111)
CO2: 23 mmol/L (ref 22–32)
CREATININE: 1.27 mg/dL — AB (ref 0.61–1.24)
GFR calc Af Amer: >60 mL/min (ref >60)
GFR, EST NON AFRICAN AMERICAN: 57 mL/min — AB (ref >60)
Glucose, Bld: 309 mg/dL — ABNORMAL HIGH (ref 65–99)
Potassium: 4.2 mmol/L (ref 3.5–5.1)
Sodium: 132 mmol/L — ABNORMAL LOW (ref 135–145)
Total Bilirubin: 0.6 mg/dL (ref 0.3–1.2)
Total Protein: 7 g/dL (ref 6.5–8.1)

## 2017-10-14 LAB — INFLUENZA PANEL BY PCR (TYPE A & B)
Influenza A By PCR: NEGATIVE
Influenza B By PCR: NEGATIVE

## 2017-10-14 MED ORDER — GABAPENTIN 300 MG PO CAPS
300.0000 mg | ORAL_CAPSULE | Freq: Three times a day (TID) | ORAL | Status: DC
  Administered 2017-10-15 – 2017-10-18 (×16): 300 mg via ORAL
  Filled 2017-10-14 (×16): qty 1

## 2017-10-14 MED ORDER — SODIUM CHLORIDE 0.9 % IV SOLN: 250.0000 mL | INTRAVENOUS | Status: DC | PRN

## 2017-10-14 MED ORDER — ACETAMINOPHEN 325 MG PO TABS
650.0000 mg | ORAL_TABLET | Freq: Four times a day (QID) | ORAL | Status: DC | PRN
Start: 2017-10-14 — End: 2017-10-18
  Administered 2017-10-15 – 2017-10-17 (×5): 650 mg via ORAL
  Filled 2017-10-14 (×5): qty 2

## 2017-10-14 MED ORDER — SODIUM CHLORIDE 0.9 % IV BOLUS (SEPSIS)
1000.0000 mL | Freq: Once | INTRAVENOUS | Status: AC
  Administered 2017-10-14: 1000 mL via INTRAVENOUS

## 2017-10-14 MED ORDER — SODIUM CHLORIDE 0.9% FLUSH
3.0000 mL | Freq: Two times a day (BID) | INTRAVENOUS | Status: DC
  Administered 2017-10-15 – 2017-10-18 (×8): 3 mL via INTRAVENOUS

## 2017-10-14 MED ORDER — FINASTERIDE 5 MG PO TABS
5.0000 mg | ORAL_TABLET | Freq: Every day | ORAL | Status: DC
  Administered 2017-10-15 – 2017-10-18 (×5): 5 mg via ORAL
  Filled 2017-10-14 (×5): qty 1

## 2017-10-14 MED ORDER — SODIUM CHLORIDE 0.9% FLUSH: 3.0000 mL | INTRAVENOUS | Status: DC | PRN

## 2017-10-14 MED ORDER — VANCOMYCIN HCL IN DEXTROSE 1-5 GM/200ML-% IV SOLN
1000.0000 mg | Freq: Once | INTRAVENOUS | Status: AC
  Administered 2017-10-14: 1000 mg via INTRAVENOUS
  Filled 2017-10-14: qty 200

## 2017-10-14 MED ORDER — PIPERACILLIN-TAZOBACTAM 3.375 G IVPB 30 MIN
3.3750 g | Freq: Once | INTRAVENOUS | Status: AC
  Administered 2017-10-14: 3.375 g via INTRAVENOUS
  Filled 2017-10-14: qty 50

## 2017-10-14 MED ORDER — ENOXAPARIN SODIUM 40 MG/0.4ML ~~LOC~~ SOLN
40.0000 mg | Freq: Every day | SUBCUTANEOUS | Status: DC
  Administered 2017-10-15 – 2017-10-18 (×4): 40 mg via SUBCUTANEOUS
  Filled 2017-10-14 (×5): qty 0.4

## 2017-10-14 MED ORDER — SODIUM CHLORIDE 0.9 % IV SOLN
500.0000 mg | Freq: Every day | INTRAVENOUS | Status: DC
  Administered 2017-10-15 (×2): 500 mg via INTRAVENOUS
  Filled 2017-10-14 (×2): qty 500

## 2017-10-14 MED ORDER — SODIUM CHLORIDE 0.9 % IV SOLN
1.0000 g | Freq: Every day | INTRAVENOUS | Status: DC
  Administered 2017-10-15 – 2017-10-17 (×4): 1 g via INTRAVENOUS
  Filled 2017-10-14 (×6): qty 10

## 2017-10-14 MED ORDER — ATORVASTATIN CALCIUM 20 MG PO TABS
20.0000 mg | ORAL_TABLET | Freq: Every evening | ORAL | Status: DC
  Administered 2017-10-15 – 2017-10-17 (×3): 20 mg via ORAL
  Filled 2017-10-14 (×3): qty 1

## 2017-10-14 MED ORDER — POTASSIUM CHLORIDE CRYS ER 20 MEQ PO TBCR
20.0000 meq | EXTENDED_RELEASE_TABLET | Freq: Every day | ORAL | Status: DC
  Administered 2017-10-15 – 2017-10-18 (×4): 20 meq via ORAL
  Filled 2017-10-14 (×4): qty 1

## 2017-10-14 MED ORDER — PANTOPRAZOLE SODIUM 40 MG PO TBEC
40.0000 mg | DELAYED_RELEASE_TABLET | Freq: Every day | ORAL | Status: DC
  Administered 2017-10-15 – 2017-10-18 (×4): 40 mg via ORAL
  Filled 2017-10-14 (×4): qty 1

## 2017-10-14 MED ORDER — MONTELUKAST SODIUM 10 MG PO TABS
10.0000 mg | ORAL_TABLET | Freq: Every day | ORAL | Status: DC
  Administered 2017-10-15 – 2017-10-17 (×4): 10 mg via ORAL
  Filled 2017-10-14 (×4): qty 1

## 2017-10-14 MED ORDER — ASPIRIN EC 81 MG PO TBEC
81.0000 mg | DELAYED_RELEASE_TABLET | Freq: Every day | ORAL | Status: DC
  Administered 2017-10-15 – 2017-10-18 (×4): 81 mg via ORAL
  Filled 2017-10-14 (×4): qty 1

## 2017-10-14 MED ORDER — HYDROXYZINE HCL 25 MG PO TABS
50.0000 mg | ORAL_TABLET | Freq: Three times a day (TID) | ORAL | Status: DC
  Administered 2017-10-15 – 2017-10-18 (×12): 50 mg via ORAL
  Filled 2017-10-14 (×12): qty 2

## 2017-10-14 MED ORDER — INSULIN ASPART 100 UNIT/ML ~~LOC~~ SOLN
0.0000 [IU] | Freq: Three times a day (TID) | SUBCUTANEOUS | Status: DC
  Administered 2017-10-15 – 2017-10-16 (×3): 5 [IU] via SUBCUTANEOUS
  Administered 2017-10-16: 3 [IU] via SUBCUTANEOUS
  Administered 2017-10-16: 5 [IU] via SUBCUTANEOUS
  Administered 2017-10-17: 3 [IU] via SUBCUTANEOUS
  Administered 2017-10-17: 5 [IU] via SUBCUTANEOUS
  Administered 2017-10-17: 3 [IU] via SUBCUTANEOUS
  Administered 2017-10-18 (×2): 5 [IU] via SUBCUTANEOUS
  Administered 2017-10-18: 3 [IU] via SUBCUTANEOUS
  Filled 2017-10-14: qty 1

## 2017-10-14 NOTE — ED Notes (Signed)
Family at bedside. 

## 2017-10-14 NOTE — ED Provider Notes (Addendum)
[facility] EMERGENCY DEPARTMENT Provider Note   CSN: [id number] Arrival date & time: 10/14/17  1840     History   Chief Complaint Chief Complaint  Patient presents with  . Altered Mental Status    HPI [name] is a 67 y.o. male.  HPI  67 year old man history of diabetes, recent URI infection symptoms presents today with episode of decreased responsiveness.  His wife states that approximately 3 PM he was sleeping.  She attempted to wake him up and was unable to.  She states that normally he is very easy to arouse.  Today she states that she had great difficulty getting him awake.  She was able to eventually get him out to the car and ride to school with her to get the kids.  She states that during that time he was mumbling and seemed very confused.  On arrival back home he continued to be confused and she called EMS.  On presentation here he was noted to have fever.  This was noted prehospital at 101.5. He has had sick contacts in the house with the wife having similar symptoms.  He did not have a flu shot this year. Past Medical History:  Diagnosis Date  . 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 Diabetes mellitus 10/17/2011   newly dx today  . Edema leg    right leg has leaky valve and right foot swells  . Emphysema   . Excessive ear wax   . Fatty liver   . Hernia    near navel  . Hyperlipidemia   . Hypertension   . Leaky heart valve   . Neuropathy   . Rheumatic fever   . Slow urinary stream   . TIA (transient ischemic attack)     Patient Active Problem List   Diagnosis Date Noted  . Community acquired pneumonia   . (HFpEF) heart failure with preserved ejection fraction (HCC), pulmonary edema and LBBB of unknown chronicity  10/29/2016  . Elevated troponin I level, presume demand ischemia  10/29/2016  . AKI (acute kidney injury) (HCC) 10/29/2016  . BPH (benign prostatic hyperplasia) 10/29/2016  . Sepsis  (HCC) 10/29/2016  . Bacteremia due to Escherichia coli 10/29/2016  . Fatty liver 10/29/2016  . Acute pulmonary edema (HCC)   . Acute hypoxic and hypercarbic respiratory failure (HCC) in setting of bilateral pulmonary infiltrates. Presume CAP + pulmonary edema +/-ALI 10/27/2016  . Left-sided weakness 05/04/2015  . TIA (transient ischemic attack) : Rule out. 05/04/2015  . Hyperlipidemia 05/04/2015  . Chest pain 10/10/2013  . SOB (shortness of breath) 10/10/2013  . Hyperglycemia without ketosis 10/17/2011  . Diabetes mellitus (HCC) 10/17/2011  . HTN (hypertension) 10/17/2011  . Foot drop, left 10/17/2011  . Impacted cerumen of right ear 10/17/2011  . Dyspnea 10/17/2011  . History of TIA (transient ischemic attack) 10/17/2011  . Neuropathy (HCC) 10/17/2011  . Aortic insufficiency 03/16/2011  . Chest pain 03/16/2011    Past Surgical History:  Procedure Laterality Date  . CIRCUMCISION    . LITHOTRIPSY          Home Medications    Prior to Admission medications   Medication Sig Start Date End Date Taking? Authorizing Provider  aspirin EC 81 MG tablet Take 81 mg by mouth daily.    [provider]  atorvastatin (LIPITOR) 20 MG tablet Take 20 mg by mouth every evening.     [provider]  carvedilol (  COREG) 6.25 MG tablet Take 1 tablet (6.25 mg total) by mouth 2 (two) times daily with a meal. 11/02/16   Bevelyn NgoGroce, Sarah F, NP  finasteride (PROSCAR) 5 MG tablet Take 5 mg by mouth daily.    [provider]  furosemide (LASIX) 20 MG tablet Take 1 tablet (20 mg total) by mouth daily. 11/03/16   Bevelyn NgoGroce, Sarah F, NP  gabapentin (NEURONTIN) 300 MG capsule Take 300 mg by mouth 4 (four) times daily. Takes 2 in the morning and 1 at night    [provider]  glipiZIDE (GLUCOTROL XL) 10 MG 24 hr tablet Take 10 mg by mouth daily with breakfast.    [provider]  hydrOXYzine (VISTARIL) 50 MG capsule Take 50 mg by mouth 3 (three) times daily.    [provider]  metFORMIN (GLUCOPHAGE-XR) 500 MG 24 hr tablet Take 500 mg by mouth 2 (two) times daily.    [provider]  montelukast (SINGULAIR) 10 MG tablet Take 10 mg by mouth at bedtime.    [provider]  pantoprazole (PROTONIX) 40 MG tablet Take 1 tablet (40 mg total) by mouth daily. 10/11/13   Philip AspenHernandez Acosta, Limmie PatriciaEstela Y, MD  Potassium Chloride ER 20 MEQ TBCR Take 20 mEq by mouth daily. 05/03/15   [provider]  Respiratory Therapy Supplies (FLUTTER) DEVI Use as directed 11/12/16   Bevelyn NgoGroce, Sarah F, NP  tamsulosin (FLOMAX) 0.4 MG CAPS capsule Take 0.4 mg by mouth daily. 05/03/15   [provider]    Family History Family History  Problem Relation Age of Onset  . Emphysema Mother   . Aneurysm Mother   . Emphysema Father   . Coronary artery disease Brother   . Coronary artery disease Brother   . Coronary artery disease Sister     Social History Social History   Tobacco Use  . Smoking status: Never Smoker  . Smokeless tobacco: Never Used  Substance Use Topics  . Alcohol use: No  . Drug use: No     Allergies   Cashew nut oil and Percocet [oxycodone-acetaminophen]   Review of Systems Review of Systems  Constitutional: Positive for fever.  HENT: Positive for congestion and rhinorrhea.   Eyes: Negative.   Respiratory: Positive for cough.   Cardiovascular: Negative.   Gastrointestinal: Positive for diarrhea. Negative for vomiting.  Endocrine: Negative.   Genitourinary: Negative.   Musculoskeletal: Negative.   Skin: Negative.   Allergic/Immunologic: Negative.   Neurological: Negative.   Hematological: Negative.   Psychiatric/Behavioral: Negative.   All other systems reviewed and are negative.    Physical Exam Updated Vital Signs BP (!) 146/69   Pulse 97   Resp 20   SpO2 97%   Physical Exam  Constitutional: He is oriented to person, place, and time. He appears well-developed and well-nourished. No distress.  HENT:  Head:  Normocephalic and atraumatic.  Right Ear: External ear normal.  Left Ear: External ear normal.  Nose: Nose normal.  Mouth/Throat: Oropharynx is clear and moist.  Eyes: Pupils are equal, round, and reactive to light. EOM are normal.  Neck: Normal range of motion. Neck supple.  Cardiovascular: Normal rate and regular rhythm.  Pulmonary/Chest: Effort normal and breath sounds normal.  Abdominal: Soft. Bowel sounds are normal.  Musculoskeletal: Normal range of motion.  Neurological: He is alert and oriented to person, place, and time.  Skin: Skin is warm and dry. Capillary refill takes less than 2 seconds.  Psychiatric: He has a normal mood and  affect.  Vitals reviewed.    ED Treatments / Results  Labs (all labs ordered are listed, but only abnormal results are displayed) Labs Reviewed  COMPREHENSIVE METABOLIC PANEL - Abnormal; Notable for the following components:      Result Value   Sodium 132 (*)    Chloride 97 (*)    Glucose, Bld 309 (*)    Creatinine, Ser 1.27 (*)    Calcium 8.4 (*)    Albumin 3.3 (*)    GFR calc non Af Amer 57 (*)    All other components within normal limits  CBC WITH DIFFERENTIAL/PLATELET - Abnormal; Notable for the following components:   WBC 11.2 (*)    RBC 3.97 (*)    Hemoglobin 11.0 (*)    HCT 34.2 (*)    Neutro Abs 9.3 (*)    All other components within normal limits  I-STAT CG4 LACTIC ACID, ED - Abnormal; Notable for the following components:   Lactic Acid, Venous 2.66 (*)    All other components within normal limits  CULTURE, BLOOD (ROUTINE X 2)  CULTURE, BLOOD (ROUTINE X 2)  URINALYSIS, ROUTINE W REFLEX MICROSCOPIC  INFLUENZA PANEL BY PCR (TYPE A & B)  I-STAT CG4 LACTIC ACID, ED    EKG EKG Interpretation  Date/Time:  Monday October 14 2017 18:50:25 EDT Ventricular Rate:  95 PR Interval:    QRS Duration: 136 QT Interval:  350 QTC Calculation: 440 R Axis:   -33 Text Interpretation:  Normal sinus rhythm Left bundle branch block Poor  data quality Confirmed by Margarita Grizzle [phone]) on 10/14/2017 9:11:44 PM   Radiology Dg Chest Port 1 View  Result Date: 10/14/2017 CLINICAL DATA:  Loss of consciousness EXAM: PORTABLE CHEST 1 VIEW COMPARISON:  11/12/2016 FINDINGS: Cardiomegaly with vascular congestion. Chronic appearing bronchitic changes with linear scarring at the bases. No acute airspace disease. No pneumothorax. IMPRESSION: 1. Cardiomegaly with vascular congestion 2. Similar appearance of chronic interstitial disease and scarring at the bases. Electronically Signed   By: Jasmine Pang M.D.   On: 10/14/2017 19:36    Procedures Procedures (including critical care time)  Medications Ordered in ED Medications  piperacillin-tazobactam (ZOSYN) IVPB 3.375 g (0 g Intravenous Stopped 10/14/17 2117)  vancomycin (VANCOCIN) IVPB 1000 mg/200 mL premix (0 mg Intravenous Stopped 10/14/17 2139)  sodium chloride 0.9 % bolus 1,000 mL (0 mLs Intravenous Stopped 10/14/17 2117)    And  sodium chloride 0.9 % bolus 1,000 mL (0 mLs Intravenous Stopped 10/14/17 2139)    And  sodium chloride 0.9 % bolus 1,000 mL (0 mLs Intravenous Stopped 10/14/17 2223)     Initial Impression / Assessment and Plan / ED Course  I have reviewed the triage vital signs and the nursing notes.  Pertinent labs & imaging results that were available during my care of the patient were reviewed by me and considered in my medical decision making (see chart for details).  Clinical Course as of Oct 14 2233  Mon Oct 14, 2017  2230 Leukocytosis noted  CBC WITH DIFFERENTIAL(!) [DR]  2230 Patient hyperglycemic with normal anion gap  Comprehensive metabolic panel(!) [DR]  2232 DG Chest Port 1 View [DR]  2232 CXR without    [DR]    Clinical Course User Index [DR] Margarita Grizzle, MD    67 y.o. Male with fever and report of ams.  Cultures and blood work obtained.  Initial lactic acid elevated and fluid bolus and broad spectrum abx initiated.  Second lactic with clearing  noted.  Flu pending.   Patient unable to urinate.  Bladder scan reveals greater than 500 cc in bladder.  Foley catheter ordered.  Urine pending.  Plan admission for further treatment and evaluation.  DDX  Sepsis uti Influenza  Other causes of altered mental status-patient is diabetic but never had blood sugar checked during these episodes-wife states he refuses to have his blood sugar checked Final Clinical Impressions(s) / ED Diagnoses   Final diagnoses:  Sepsis, due to unspecified organism [facility])    ED Discharge Orders    None       Margarita Grizzle, MD 10/15/17 1435    Margarita Grizzle, MD 10/15/17 1436

## 2017-10-14 NOTE — H&P (Signed)
History and Physical    Kellie Chisolm JYN:[id number] DOB: 1950/12/12 DOA: 10/14/2017  PCP: [facility], Medical, MD (Inactive)  Patient coming from: Home  I have personally briefly reviewed patient's old medical records in [facility]  Chief Complaint: AMS  HPI: [name] is a 67 y.o. male with medical history significant of DM2, chronic peripheral edema, chronic systolic CHF with moderate AR.  Patient presents to the ED after episode of reported decreased responsiveness.  Wife stated that at about 3 PM patient was sleeping.  She attempted to wake him up and was unable to.  She states that normally he is very easy to arouse.  Today she states that she had great difficulty getting him awake.  She was able to eventually get him out to the car and ride to school with her to get the kids.  She states that during that time he was mumbling and seemed very confused.  On arrival back home he continued to be confused and she called EMS.  On presentation here he was noted to have fever.  This was noted prehospital at 101.5.  Wife and patient have been having URI symptoms recently.  Cough, congestion, runny nose.  Got zosyn and vanc in ED.   ED Course: Influenza neg.  CXR neg.  Initial lactate 2.6, got 3L NS and lactate cleared to 1.6.  Now has developed new mild hypoxia with O2 sat ~90 on RA.  WBC 11.2.  Creat 1.2, BUN 18.  Bladder scan showed > 500cc.  Foley placed for urinary retention and 700 cc UOP.  UA neg for UTI.   Review of Systems: As per HPI otherwise 10 point review of systems negative.   Past Medical History:  Diagnosis Date  . 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 Diabetes mellitus 10/17/2011   newly dx today  . Edema leg    right leg has leaky valve and right foot swells  . Emphysema   . Excessive ear wax   . Fatty liver   . Hernia    near navel  . Hyperlipidemia   . Hypertension   . Leaky heart valve   . Neuropathy   .  Rheumatic fever   . Slow urinary stream   . TIA (transient ischemic attack)     Past Surgical History:  Procedure Laterality Date  . CIRCUMCISION    . LITHOTRIPSY       reports that he has never smoked. He has never used smokeless tobacco. He reports that he does not drink alcohol or use drugs.  Allergies  Allergen Reactions  . Cashew Nut Oil Palpitations  . Percocet [Oxycodone-Acetaminophen] Other (See Comments)    Hallucintaions    Family History  Problem Relation Age of Onset  . Emphysema Mother   . Aneurysm Mother   . Emphysema Father   . Coronary artery disease Brother   . Coronary artery disease Brother   . Coronary artery disease Sister     Prior to Admission medications   Medication Sig Start Date End Date Taking? Authorizing Provider  aspirin EC 81 MG tablet Take 81 mg by mouth daily.    [provider]  atorvastatin (LIPITOR) 20 MG tablet Take 20 mg by mouth every evening.     [provider]  carvedilol (COREG) 6.25 MG tablet Take 1 tablet (6.25 mg total) by mouth 2 (two) times daily with a meal. 11/02/16   Bevelyn Ngo, NP  finasteride (PROSCAR) 5 MG tablet Take 5 mg by mouth daily.    [provider]  furosemide (LASIX) 20 MG tablet Take 1 tablet (20 mg total) by mouth daily. 11/03/16   Bevelyn NgoGroce, Sarah F, NP  gabapentin (NEURONTIN) 300 MG capsule Take 300 mg by mouth 4 (four) times daily. Takes 2 in the morning and 1 at night    [provider]  glipiZIDE (GLUCOTROL XL) 10 MG 24 hr tablet Take 10 mg by mouth daily with breakfast.    [provider]  hydrOXYzine (VISTARIL) 50 MG capsule Take 50 mg by mouth 3 (three) times daily.    [provider]  metFORMIN (GLUCOPHAGE-XR) 500 MG 24 hr tablet Take 500 mg by mouth 2 (two) times daily.    [provider]  montelukast (SINGULAIR) 10 MG tablet Take 10 mg by mouth at bedtime.    [provider]  pantoprazole (PROTONIX) 40 MG tablet Take 1 tablet (40  mg total) by mouth daily. 10/11/13   Philip AspenHernandez Acosta, Limmie PatriciaEstela Y, MD  Potassium Chloride ER 20 MEQ TBCR Take 20 mEq by mouth daily. 05/03/15   [provider]  Respiratory Therapy Supplies (FLUTTER) DEVI Use as directed 11/12/16   Bevelyn NgoGroce, Sarah F, NP  tamsulosin (FLOMAX) 0.4 MG CAPS capsule Take 0.4 mg by mouth daily. 05/03/15   [provider]    Physical Exam: Vitals:   10/14/17 2130 10/14/17 2200 10/14/17 2245 10/14/17 2300  BP: (!) 146/69 (!) 157/93 (!) 163/87 (!) 148/83  Pulse: 97 95  95  Resp: 20 (!) 27  19  SpO2: 97% 96% (!) 88% (!) 89%    Constitutional: NAD, calm, comfortable Eyes: PERRL, lids and conjunctivae normal ENMT: Mucous membranes are moist. Posterior pharynx clear of any exudate or lesions.Normal dentition.  Neck: normal, supple, no masses, no thyromegaly Respiratory: Few crackles Cardiovascular: 3+ BLE edema that wife states is actually better than baseline. Abdomen: Distended but patient reports this is baseline, no tenderness, no guarding, no rebound. Musculoskeletal: no clubbing / cyanosis. No joint deformity upper and lower extremities. Good ROM, no contractures. Normal muscle tone.  Skin: no rashes, lesions, ulcers. No induration Neurologic: CN 2-12 grossly intact. Sensation intact, DTR normal. Strength 5/5 in all 4.  Psychiatric: Normal judgment and insight. Alert and oriented x 3. Normal mood.    Labs on Admission: I have personally reviewed following labs and imaging studies  CBC: Recent Labs  Lab 10/14/17 1845  WBC 11.2*  NEUTROABS 9.3*  HGB 11.0*  HCT 34.2*  MCV 86.1  PLT 255   Basic Metabolic Panel: Recent Labs  Lab 10/14/17 1845  NA 132*  K 4.2  CL 97*  CO2 23  GLUCOSE 309*  BUN 18  CREATININE 1.27*  CALCIUM 8.4*   GFR: CrCl cannot be calculated (Unknown ideal weight.). Liver Function Tests: Recent Labs  Lab 10/14/17 1845  AST 17  ALT 18  ALKPHOS 111  BILITOT 0.6  PROT 7.0  ALBUMIN 3.3*   No results for  input(s): LIPASE, AMYLASE in the last 168 hours. No results for input(s): AMMONIA in the last 168 hours. Coagulation Profile: No results for input(s): INR, PROTIME in the last 168 hours. Cardiac Enzymes: No results for input(s): CKTOTAL, CKMB, CKMBINDEX, TROPONINI in the last 168 hours. BNP (last 3 results) No results for input(s): PROBNP in the last 8760 hours. HbA1C: No results for input(s): HGBA1C in the last 72 hours. CBG: No results for input(s): GLUCAP in the last 168 hours. Lipid  Profile: No results for input(s): CHOL, HDL, LDLCALC, TRIG, CHOLHDL, LDLDIRECT in the last 72 hours. Thyroid Function Tests: No results for input(s): TSH, T4TOTAL, FREET4, T3FREE, THYROIDAB in the last 72 hours. Anemia Panel: No results for input(s): VITAMINB12, FOLATE, FERRITIN, TIBC, IRON, RETICCTPCT in the last 72 hours. Urine analysis:    Component Value Date/Time   COLORURINE STRAW (A) 10/14/2017 2254   APPEARANCEUR CLEAR 10/14/2017 2254   LABSPEC 1.010 10/14/2017 2254   PHURINE 5.0 10/14/2017 2254   GLUCOSEU >=500 (A) 10/14/2017 2254   HGBUR SMALL (A) 10/14/2017 2254   BILIRUBINUR NEGATIVE 10/14/2017 2254   KETONESUR NEGATIVE 10/14/2017 2254   PROTEINUR NEGATIVE 10/14/2017 2254   UROBILINOGEN 1.0 05/04/2015 1415   NITRITE NEGATIVE 10/14/2017 2254   LEUKOCYTESUR NEGATIVE 10/14/2017 2254    Radiological Exams on Admission: Dg Chest Port 1 View  Result Date: 10/14/2017 CLINICAL DATA:  Loss of consciousness EXAM: PORTABLE CHEST 1 VIEW COMPARISON:  11/12/2016 FINDINGS: Cardiomegaly with vascular congestion. Chronic appearing bronchitic changes with linear scarring at the bases. No acute airspace disease. No pneumothorax. IMPRESSION: 1. Cardiomegaly with vascular congestion 2. Similar appearance of chronic interstitial disease and scarring at the bases. Electronically Signed   By: Jasmine Pang M.D.   On: 10/14/2017 19:36    EKG: Independently reviewed.  Assessment/Plan Principal Problem:    Influenza-like illness Active Problems:   Diabetes mellitus (HCC)   Acute respiratory failure with hypoxia (HCC)   Delirium   Acute urinary retention    1. ILI with delirium and now hypoxia after IVF - 1. Repeat CXR to see if patient has PNA 2. Will de-escalate ABx to CAP coverage (rocephin / azithro) 3. PNA pathway 4. Cultures pending 5. Flu neg, will order resp virus panel 6. Tylenol PRN fever 7. Repeating CXR, procalcitonin, BNP given reduced sats after IVF 1. Suspect we either will see PNA now that patient is hydrated, or have possibly fluid overloaded patient slightly. 2. Will saline lock fluids for now 2. Urinary retention - foley in place 3. DM2 - 1. Hold home metformin 2. Mod scale SSI AC  DVT prophylaxis: Lovenox Code Status: Full Family Communication: No family in room Disposition Plan: Home after admit Consults called: None Admission status: Admit to inpatient   Hillary Bow DO [facility] Pager [phone]  If 7AM-7PM, please contact day team taking care of patient [email or web address] Password [facility]  10/14/2017, 11:32 PM

## 2017-10-14 NOTE — ED Notes (Signed)
ED Provider at bedside. 

## 2017-10-14 NOTE — ED Notes (Signed)
Code sepsis activated RN Madison aware 

## 2017-10-14 NOTE — ED Triage Notes (Signed)
Patient arrived from home via EMS. Family reports patient having episodes of loss of consciousness. Patient does not recall events. Patient presents with cough, runny nose, hypertension, and elevated temperature. Temperature 101.5 upon EMS arrival. EMS provided 1000 mg of Tylenol. Currently patient alert X4. HX of DM.

## 2017-10-14 NOTE — ED Notes (Signed)
Notified MD of need for med orders

## 2017-10-14 NOTE — ED Notes (Signed)
Bladder scanner noted 500cc in bladder. MD Ray notified. MD Ray to ordered this nurse to insert foley catheter.

## 2017-10-14 NOTE — ED Notes (Signed)
Vital signs stable. 

## 2017-10-14 NOTE — ED Notes (Signed)
Family updated as to patient's status.

## 2017-10-14 NOTE — ED Notes (Signed)
X-ray at bedside

## 2017-10-15 ENCOUNTER — Encounter (HOSPITAL_COMMUNITY): Payer: Self-pay

## 2017-10-15 LAB — CBC
HEMATOCRIT: 33.1 % — AB (ref 39.0–52.0)
HEMOGLOBIN: 10.7 g/dL — AB (ref 13.0–17.0)
MCH: 27.9 pg (ref 26.0–34.0)
MCHC: 32.3 g/dL (ref 30.0–36.0)
MCV: 86.2 fL (ref 78.0–100.0)
Platelets: 229 10*3/uL (ref 150–400)
RBC: 3.84 MIL/uL — ABNORMAL LOW (ref 4.22–5.81)
RDW: 13.9 % (ref 11.5–15.5)
WBC: 11.3 10*3/uL — AB (ref 4.0–10.5)

## 2017-10-15 LAB — RESPIRATORY PANEL BY PCR
ADENOVIRUS-RVPPCR: NOT DETECTED
Bordetella pertussis: NOT DETECTED
CORONAVIRUS 229E-RVPPCR: NOT DETECTED
CORONAVIRUS HKU1-RVPPCR: NOT DETECTED
CORONAVIRUS OC43-RVPPCR: NOT DETECTED
Chlamydophila pneumoniae: NOT DETECTED
Coronavirus NL63: NOT DETECTED
Influenza A: NOT DETECTED
Influenza B: NOT DETECTED
METAPNEUMOVIRUS-RVPPCR: NOT DETECTED
Mycoplasma pneumoniae: NOT DETECTED
PARAINFLUENZA VIRUS 1-RVPPCR: NOT DETECTED
PARAINFLUENZA VIRUS 2-RVPPCR: NOT DETECTED
Parainfluenza Virus 3: NOT DETECTED
Parainfluenza Virus 4: NOT DETECTED
Respiratory Syncytial Virus: NOT DETECTED
Rhinovirus / Enterovirus: DETECTED — AB

## 2017-10-15 LAB — BASIC METABOLIC PANEL
Anion gap: 9 (ref 5–15)
BUN: 17 mg/dL (ref 6–20)
CHLORIDE: 103 mmol/L (ref 101–111)
CO2: 24 mmol/L (ref 22–32)
Calcium: 7.8 mg/dL — ABNORMAL LOW (ref 8.9–10.3)
Creatinine, Ser: 1.13 mg/dL (ref 0.61–1.24)
GFR calc Af Amer: >60 mL/min (ref >60)
GFR calc non Af Amer: >60 mL/min (ref >60)
GLUCOSE: 262 mg/dL — AB (ref 65–99)
Potassium: 4 mmol/L (ref 3.5–5.1)
Sodium: 136 mmol/L (ref 135–145)

## 2017-10-15 LAB — MRSA PCR SCREENING: MRSA BY PCR: NEGATIVE

## 2017-10-15 LAB — GLUCOSE, CAPILLARY
Glucose-Capillary: 249 mg/dL — ABNORMAL HIGH (ref 65–99)
Glucose-Capillary: 219 mg/dL — ABNORMAL HIGH (ref 65–99)
Glucose-Capillary: 286 mg/dL — ABNORMAL HIGH (ref 65–99)

## 2017-10-15 LAB — EXPECTORATED SPUTUM ASSESSMENT W GRAM STAIN, RFLX TO RESP C

## 2017-10-15 LAB — HEMOGLOBIN A1C
HEMOGLOBIN A1C: 10.6 % — AB (ref 4.8–5.6)
MEAN PLASMA GLUCOSE: 257.52 mg/dL

## 2017-10-15 LAB — HIV ANTIBODY (ROUTINE TESTING W REFLEX): HIV SCREEN 4TH GENERATION: NONREACTIVE

## 2017-10-15 LAB — BRAIN NATRIURETIC PEPTIDE: B NATRIURETIC PEPTIDE 5: 116.6 pg/mL — AB (ref 0.0–100.0)

## 2017-10-15 LAB — STREP PNEUMONIAE URINARY ANTIGEN: STREP PNEUMO URINARY ANTIGEN: NEGATIVE

## 2017-10-15 LAB — CBG MONITORING, ED: GLUCOSE-CAPILLARY: 263 mg/dL — AB (ref 65–99)

## 2017-10-15 LAB — PROCALCITONIN: Procalcitonin: <0.1 ng/mL

## 2017-10-15 MED ORDER — INSULIN ASPART 100 UNIT/ML ~~LOC~~ SOLN
0.0000 [IU] | Freq: Every day | SUBCUTANEOUS | Status: DC
  Administered 2017-10-15: 3 [IU] via SUBCUTANEOUS
  Administered 2017-10-16 – 2017-10-17 (×2): 2 [IU] via SUBCUTANEOUS

## 2017-10-15 MED ORDER — CARVEDILOL 6.25 MG PO TABS
6.2500 mg | ORAL_TABLET | Freq: Two times a day (BID) | ORAL | Status: DC
  Administered 2017-10-15 – 2017-10-18 (×7): 6.25 mg via ORAL
  Filled 2017-10-15 (×7): qty 1

## 2017-10-15 MED ORDER — INSULIN ASPART 100 UNIT/ML ~~LOC~~ SOLN: 0.0000 [IU] | Freq: Three times a day (TID) | SUBCUTANEOUS | Status: DC

## 2017-10-15 MED ORDER — TAMSULOSIN HCL 0.4 MG PO CAPS
0.4000 mg | ORAL_CAPSULE | Freq: Every day | ORAL | Status: DC
  Administered 2017-10-15 – 2017-10-18 (×4): 0.4 mg via ORAL
  Filled 2017-10-15 (×4): qty 1

## 2017-10-15 NOTE — Progress Notes (Signed)
1000 Pt arrived via stretcher from ED. Pt A&Ox4, moans at times with movement and activity. Skin checked with Jae DireKate, RN. Applied 2L [place] for low O2 sat. Other vital signs stable. Pt denies pain, c/o nausea. Pt informed RN that MD is going to order. Awaiting med order. Pt given CHG bath and updated with POC. Pt assessed see flow sheet. Pt with productive, strong cough. Sputum sent to lab. Fall precautions in place, foley to gravity. WCTM.   1030 Pt medicated per MAR. Stated his nausea is better. RN provided saltines and diet ginger ale for meds. Wife at bedside, updated with POC. No complaints at this time. WCTM.   1115 Bedside shift report given to Tammy, RN. Pt resting in bed, wife at bedside. NAD.

## 2017-10-15 NOTE — ED Notes (Signed)
Carb modified breakfast tray ordered 

## 2017-10-15 NOTE — Plan of Care (Signed)
Patient is slowly progressing toward care goals.  He is afebrile and has no complaints of nausea.  VSS.  Blood sugars remain high and patient receives SSI coverage w/each meal. No complaints of pain.  Patient states he already feels much better than this Am.  Earlier sputum sample needed to be re-drawn; await new specimen; patient educated on how to provide new specimen.  Droplet precautions continue. Will continue to monitor.

## 2017-10-15 NOTE — Progress Notes (Signed)
Inpatient Diabetes Program Recommendations  AACE/ADA: New Consensus Statement on Inpatient Glycemic Control (2015)  Target Ranges:  Prepandial:   less than 140 mg/dL      Peak postprandial:   less than 180 mg/dL (1-2 hours)      Critically ill patients:  140 - 180 mg/dL   Results for [name], [name] (MRN [id number]) as of 10/15/2017 12:38  Ref. Range 10/15/2017 08:07 10/15/2017 11:13  Glucose-Capillary Latest Ref Range: 65 - 99 mg/dL [id number] (H) [id number] (H)   Review of Glycemic Control  Diabetes history: DM2 Outpatient Diabetes medications: Glipizide 5 mg QAM, Metformin XR 500 mg BID Current orders for Inpatient glycemic control: Novolog 0-15 units TID with meals  Inpatient Diabetes Program Recommendations:  Correction (SSI): Please consider ordering Novolog 0-5 units QHS for bedtime correction scale. HgbA1C: Please consider ordering an A1C to evaluate glycemic control over the past 2-3 months.  Thanks, Orlando PennerMarie Aileena Iglesia, RN, MSN, CDE Diabetes Coordinator Inpatient Diabetes Program [phone] (Team Pager from 8am to 5pm)

## 2017-10-15 NOTE — Progress Notes (Signed)
[facility]                                                                              Patient Demographics  [name], is a 67 y.o. male, DOB - 16-Nov-1950, WJX:[id number]  Admit date - 10/14/2017   Admitting Physician Hillary Bow, DO  Outpatient Primary MD for the patient is Clinic, [facility], MD (Inactive)  Outpatient specialists:   LOS - 1  days   Medical records reviewed and are as summarized below:    Chief Complaint  Patient presents with  . Altered Mental Status       Brief summary   Patient is a 67 y.o. male with DM2, chronic peripheral edema, chronic systolic CHF with moderate AR resented to ED with decreased responsiveness and lethargy.  Patient's wife reported that he had been sleeping since 3 PM on the day of admission and she was unable to wake him up.  She also noticed him to be confused.  Temp was 101.5 F. Wife and patient, grandkids also had same URI symptoms with cough and congestion. Patient was admitted for presumed pneumonia  Assessment & Plan    Principal Problem:   Acute respiratory failure with hypoxia (HCC) with sepsis  - in ED, patient had mild hypoxia with O2 sats 90% on room air,-leukocytosis 11.2, creatinine 1.2, lactic acidosis, febrile, source possibly pneumonia -Patient received IV fluid hydration, pro calcitonin less than 0.1, lactic acid 2.6, improved to 1.6 -  follow blood cultures, urine strep antigen negative, influenza panel negative respiratory virus panel pending -Continue IV Zithromax, Rocephin for presumed community-acquired pneumonia  Active Problems:   Diabetes mellitus (HCC) type II, on oral hypoglycemic, uncontrolled with hyperglycemia -Continue sliding scale insulin, follow hemoglobin A1c -Hold glipizide and metformin    HTN (hypertension) -BP currently stable, restart Coreg    Hyperlipidemia Continue Lipitor    Delirium/acute metabolic encephalopathy -Patient currently alert and oriented,  probably worsened due to #1    Acute urinary retention history of BPH -NED bladder scan showed more than 500 cc, patient Foley catheter was placed.  UA negative for UTI -Restarted Flomax, finasteride  Code Status: Full CODE STATUS DVT Prophylaxis:  Lovenox  Family Communication: Discussed in detail with the patient, all imaging results, lab results explained to the patient   Disposition Plan: When medically stable  Time Spent in minutes 35 minutes   Procedures:  Plan  Consultants:   None  Antimicrobials:   IV Zithromax 4/15  IV Rocephin 4/15   Medications  Scheduled Meds: . aspirin EC  81 mg Oral Daily  . atorvastatin  20 mg Oral QPM  . enoxaparin (LOVENOX) injection  40 mg Subcutaneous Daily  . finasteride  5 mg Oral Daily  . gabapentin  300 mg Oral TID AC & HS  . hydrOXYzine  50 mg Oral TID  . insulin aspart  0-15 Units Subcutaneous TID WC  . montelukast  10 mg Oral QHS  . pantoprazole  40 mg Oral Daily  . potassium chloride SA  20 mEq Oral Daily  . sodium chloride flush  3 mL Intravenous Q12H   Continuous Infusions: . sodium  chloride    . azithromycin Stopped (10/15/17 0219)  . cefTRIAXone (ROCEPHIN)  IV Stopped (10/15/17 0219)   PRN Meds:.sodium chloride, acetaminophen, sodium chloride flush   Antibiotics   Anti-infectives (From admission, onward)   Start     Dose/Rate Route Frequency Ordered Stop   10/15/17 0100  cefTRIAXone (ROCEPHIN) 1 g in sodium chloride 0.9 % 100 mL IVPB     1 g 200 mL/hr over 30 Minutes Intravenous Daily at bedtime 10/14/17 2337 10/21/17 2159   10/15/17 0030  azithromycin (ZITHROMAX) 500 mg in sodium chloride 0.9 % 250 mL IVPB     500 mg 250 mL/hr over 60 Minutes Intravenous Daily at bedtime 10/14/17 2337 10/21/17 2159   10/14/17 2030  piperacillin-tazobactam (ZOSYN) IVPB 3.375 g     3.375 g 100 mL/hr over 30 Minutes Intravenous  Once 10/14/17 2020 10/14/17 2117   10/14/17 2030  vancomycin (VANCOCIN) IVPB 1000 mg/200 mL premix      1,000 mg 200 mL/hr over 60 Minutes Intravenous  Once 10/14/17 2020 10/14/17 2139        Subjective:   [name] was seen and examined today.  Feeling a lot better today, low-grade temp of 99 F this morning.  No chest pain. Patient denies dizziness, chest pain,  abdominal pain, N/V/D/C, new weakness, numbess, tingling.  Objective:   Vitals:   10/15/17 0800 10/15/17 0815 10/15/17 0822 10/15/17 0830  BP: (!) 155/88 (!) 151/88  (!) 156/89  Pulse: 77 87  84  Resp: (!) 21 (!) 27  (!) 23  Temp:   99 F (37.2 C)   TempSrc:   Oral   SpO2: 92% 92%  92%    Intake/Output Summary (Last 24 hours) at 10/15/2017 1006 Last data filed at 10/15/2017 0849 Gross per 24 hour  Intake 3600 ml  Output 2450 ml  Net 1150 ml     Wt Readings from Last 3 Encounters:  11/12/16 100.2 kg (221 lb)  11/02/16 98.2 kg (216 lb 8 oz)  10/10/13 95.2 kg (209 lb 14.1 oz)     Exam  General: Alert and oriented x 3, NAD  Eyes: PERRLA, EOMI, Anicteric Sclera,  HEENT:  Atraumatic, normocephalic  Cardiovascular: S1 S2 auscultated,  Regular rate and rhythm.  Respiratory: Decreased breath sound at the bases otherwise no wheezing  Gastrointestinal: Soft, nontender, nondistended, + bowel sounds  Ext: no pedal edema bilaterally  Neuro: no new deficits  Musculoskeletal: No digital cyanosis, clubbing  Skin: No rashes  Psych: Normal affect and demeanor, alert and oriented x3    Data Reviewed:  I have personally reviewed following labs and imaging studies  Micro Results No results found for this or any previous visit (from the past 240 hour(s)).  Radiology Reports Dg Chest Port 1 View  Result Date: 10/14/2017 CLINICAL DATA:  Loss of consciousness EXAM: PORTABLE CHEST 1 VIEW COMPARISON:  11/12/2016 FINDINGS: Cardiomegaly with vascular congestion. Chronic appearing bronchitic changes with linear scarring at the bases. No acute airspace disease. No pneumothorax. IMPRESSION: 1. Cardiomegaly with  vascular congestion 2. Similar appearance of chronic interstitial disease and scarring at the bases. Electronically Signed   By: Jasmine PangKim  Fujinaga M.D.   On: 10/14/2017 19:36   Dg Chest Port 1v Same Day  Result Date: 10/15/2017 CLINICAL DATA:  Hypoxia EXAM: PORTABLE CHEST 1 VIEW COMPARISON:  10/14/2017, 11/12/2016, 10/10/2013, 10/27/2016 FINDINGS: Cardiomegaly. Streaky basilar atelectasis or scar. No pneumothorax. IMPRESSION: 1. Cardiomegaly 2. Streaky left greater than right basilar atelectasis or scar. Electronically Signed  By: Jasmine Pang M.D.   On: 10/15/2017 00:09    Lab Data:  CBC: Recent Labs  Lab 10/14/17 1845 10/15/17 0042  WBC 11.2* 11.3*  NEUTROABS 9.3*  --   HGB 11.0* 10.7*  HCT 34.2* 33.1*  MCV 86.1 86.2  PLT 255 229   Basic Metabolic Panel: Recent Labs  Lab 10/14/17 1845 10/15/17 0042  NA 132* 136  K 4.2 4.0  CL 97* 103  CO2 23 24  GLUCOSE 309* 262*  BUN 18 17  CREATININE 1.27* 1.13  CALCIUM 8.4* 7.8*   GFR: CrCl cannot be calculated (Unknown ideal weight.). Liver Function Tests: Recent Labs  Lab 10/14/17 1845  AST 17  ALT 18  ALKPHOS 111  BILITOT 0.6  PROT 7.0  ALBUMIN 3.3*   No results for input(s): LIPASE, AMYLASE in the last 168 hours. No results for input(s): AMMONIA in the last 168 hours. Coagulation Profile: No results for input(s): INR, PROTIME in the last 168 hours. Cardiac Enzymes: No results for input(s): CKTOTAL, CKMB, CKMBINDEX, TROPONINI in the last 168 hours. BNP (last 3 results) No results for input(s): PROBNP in the last 8760 hours. HbA1C: No results for input(s): HGBA1C in the last 72 hours. CBG: Recent Labs  Lab 10/15/17 0807  GLUCAP 263*   Lipid Profile: No results for input(s): CHOL, HDL, LDLCALC, TRIG, CHOLHDL, LDLDIRECT in the last 72 hours. Thyroid Function Tests: No results for input(s): TSH, T4TOTAL, FREET4, T3FREE, THYROIDAB in the last 72 hours. Anemia Panel: No results for input(s): VITAMINB12, FOLATE,  FERRITIN, TIBC, IRON, RETICCTPCT in the last 72 hours. Urine analysis:    Component Value Date/Time   COLORURINE STRAW (A) 10/14/2017 2254   APPEARANCEUR CLEAR 10/14/2017 2254   LABSPEC 1.010 10/14/2017 2254   PHURINE 5.0 10/14/2017 2254   GLUCOSEU >=500 (A) 10/14/2017 2254   HGBUR SMALL (A) 10/14/2017 2254   BILIRUBINUR NEGATIVE 10/14/2017 2254   KETONESUR NEGATIVE 10/14/2017 2254   PROTEINUR NEGATIVE 10/14/2017 2254   UROBILINOGEN 1.0 05/04/2015 1415   NITRITE NEGATIVE 10/14/2017 2254   LEUKOCYTESUR NEGATIVE 10/14/2017 2254     Drewey Begue M.D. [facility] 10/15/2017, 10:06 AM  Pager: [phone] Between 7am to 7pm - call Pager - [phone]  After 7pm go to [email or web address] - password [email or web address]  Call night coverage person covering after 7pm

## 2017-10-16 LAB — EXPECTORATED SPUTUM ASSESSMENT W GRAM STAIN, RFLX TO RESP C

## 2017-10-16 LAB — GLUCOSE, CAPILLARY
GLUCOSE-CAPILLARY: 245 mg/dL — AB (ref 65–99)
Glucose-Capillary: 189 mg/dL — ABNORMAL HIGH (ref 65–99)
Glucose-Capillary: 249 mg/dL — ABNORMAL HIGH (ref 65–99)
Glucose-Capillary: 236 mg/dL — ABNORMAL HIGH (ref 65–99)

## 2017-10-16 LAB — EXPECTORATED SPUTUM ASSESSMENT W REFEX TO RESP CULTURE

## 2017-10-16 MED ORDER — INSULIN ASPART 100 UNIT/ML ~~LOC~~ SOLN
3.0000 [IU] | Freq: Three times a day (TID) | SUBCUTANEOUS | Status: DC
  Administered 2017-10-16 – 2017-10-17 (×3): 3 [IU] via SUBCUTANEOUS

## 2017-10-16 MED ORDER — INSULIN GLARGINE 100 UNIT/ML ~~LOC~~ SOLN
20.0000 [IU] | Freq: Every day | SUBCUTANEOUS | Status: DC
  Administered 2017-10-16: 20 [IU] via SUBCUTANEOUS
  Filled 2017-10-16 (×2): qty 0.2

## 2017-10-16 MED ORDER — ONDANSETRON HCL 4 MG/2ML IJ SOLN
4.0000 mg | Freq: Four times a day (QID) | INTRAMUSCULAR | Status: DC | PRN
  Administered 2017-10-17: 4 mg via INTRAVENOUS
  Filled 2017-10-16: qty 2

## 2017-10-16 MED ORDER — ALUM & MAG HYDROXIDE-SIMETH 200-200-20 MG/5ML PO SUSP
30.0000 mL | ORAL | Status: DC | PRN
  Administered 2017-10-16: 30 mL via ORAL
  Filled 2017-10-16: qty 30

## 2017-10-16 MED ORDER — AZITHROMYCIN 250 MG PO TABS
500.0000 mg | ORAL_TABLET | Freq: Every day | ORAL | Status: DC
  Administered 2017-10-16 – 2017-10-17 (×2): 500 mg via ORAL
  Filled 2017-10-16 (×2): qty 2

## 2017-10-16 MED ORDER — ALUM & MAG HYDROXIDE-SIMETH 200-200-20 MG/5ML PO SUSP: 15.0000 mL | Freq: Four times a day (QID) | ORAL | Status: DC | PRN

## 2017-10-16 MED ORDER — HALOPERIDOL LACTATE 5 MG/ML IJ SOLN
1.0000 mg | Freq: Once | INTRAMUSCULAR | Status: AC
  Administered 2017-10-17: 1 mg via INTRAVENOUS
  Filled 2017-10-16: qty 1

## 2017-10-16 NOTE — Progress Notes (Signed)
Pt c/o visual hallucinations. States he saw dogs in his room earlier and he's now see bugs crawling. On call provider paged and made aware.

## 2017-10-16 NOTE — Progress Notes (Signed)
[facility]                                                                              Patient Demographics  [name], is a 67 y.o. male, DOB - Oct 04, 1950, ZOX:[id number]  Admit date - 10/14/2017   Admitting Physician Hillary Bow, DO  Outpatient Primary MD for the patient is Clinic, [facility], MD (Inactive)  Outpatient specialists:   LOS - 2  days   Medical records reviewed and are as summarized below:    Chief Complaint  Patient presents with  . Altered Mental Status       Brief summary   Patient is a 67 y.o. male with DM2, chronic peripheral edema, chronic systolic CHF with moderate AR resented to ED with decreased responsiveness and lethargy.  Patient's wife reported that he had been sleeping since 3 PM on the day of admission and she was unable to wake him up.  She also noticed him to be confused.  Temp was 101.5 F. Wife and patient, grandkids also had same URI symptoms with cough and congestion. Patient was admitted for presumed pneumonia  Assessment & Plan    Principal Problem:   Acute respiratory failure with hypoxia (HCC) with sepsis, likely viral URI - in ED, patient had mild hypoxia with O2 sats 90% on room air,-leukocytosis 11.2, creatinine 1.2, lactic acidosis, febrile, source possibly pneumonia -Patient received IV fluid hydration, pro calcitonin less than 0.1, lactic acid 2.6, improved to 1.6 -Blood cultures, sputum cultures negative so far.  Urine strep antigen negative.  Influenza panel negative.  Respiratory virus panel showed rhinovirus/enterovirus -Continue IV Zithromax, Rocephin for presumed community-acquired pneumonia  Active Problems:   Diabetes mellitus (HCC) type II, on oral hypoglycemic, uncontrolled with hyperglycemia -Hemoglobin A1c 10.6, uncontrolled diabetes  -Placed on Lantus 20 units daily, NovoLog meal coverage 3 units 3 times daily AC,  -Hold glipizide and metformin    HTN (hypertension) -BP currently stable,  continue current Coreg    Hyperlipidemia Continue Lipitor    Delirium/acute metabolic encephalopathy -Patient currently alert and oriented, probably worsened due to #1    Acute urinary retention history of BPH -In ED bladder scan showed more than 500 cc, patient Foley catheter was placed.  UA negative for UTI -Restarted Flomax, finasteride  Code Status: Full CODE STATUS DVT Prophylaxis:  Lovenox  Family Communication: Discussed in detail with the patient, all imaging results, lab results explained to the patient   Disposition Plan: When medically stable  Time Spent in minutes 35 minutes   Procedures:  Plan  Consultants:   None  Antimicrobials:   IV Zithromax 4/15  IV Rocephin 4/15   Medications  Scheduled Meds: . aspirin EC  81 mg Oral Daily  . atorvastatin  20 mg Oral QPM  . carvedilol  6.25 mg Oral BID WC  . enoxaparin (LOVENOX) injection  40 mg Subcutaneous Daily  . finasteride  5 mg Oral Daily  . gabapentin  300 mg Oral TID AC & HS  . hydrOXYzine  50 mg Oral TID  . insulin aspart  0-15 Units Subcutaneous TID WC  . insulin aspart  0-5 Units Subcutaneous  QHS  . insulin aspart  3 Units Subcutaneous TID WC  . insulin glargine  20 Units Subcutaneous QHS  . montelukast  10 mg Oral QHS  . pantoprazole  40 mg Oral Daily  . potassium chloride SA  20 mEq Oral Daily  . sodium chloride flush  3 mL Intravenous Q12H  . tamsulosin  0.4 mg Oral Daily   Continuous Infusions: . sodium chloride    . azithromycin Stopped (10/15/17 2300)  . cefTRIAXone (ROCEPHIN)  IV Stopped (10/15/17 2230)   PRN Meds:.sodium chloride, acetaminophen, sodium chloride flush   Antibiotics   Anti-infectives (From admission, onward)   Start     Dose/Rate Route Frequency Ordered Stop   10/15/17 0100  cefTRIAXone (ROCEPHIN) 1 g in sodium chloride 0.9 % 100 mL IVPB     1 g 200 mL/hr over 30 Minutes Intravenous Daily at bedtime 10/14/17 2337 10/21/17 2159   10/15/17 0030  azithromycin  (ZITHROMAX) 500 mg in sodium chloride 0.9 % 250 mL IVPB     500 mg 250 mL/hr over 60 Minutes Intravenous Daily at bedtime 10/14/17 2337 10/21/17 2159   10/14/17 2030  piperacillin-tazobactam (ZOSYN) IVPB 3.375 g     3.375 g 100 mL/hr over 30 Minutes Intravenous  Once 10/14/17 2020 10/14/17 2117   10/14/17 2030  vancomycin (VANCOCIN) IVPB 1000 mg/200 mL premix     1,000 mg 200 mL/hr over 60 Minutes Intravenous  Once 10/14/17 2020 10/14/17 2139        Subjective:   Buelah ManisRodney Coles was seen and examined today.  Feeling somewhat nauseous, no vomiting, no abdominal pain or diarrhea.  No fevers or chills. Patient denies dizziness, chest pain,  abdominal pain, new weakness, numbess, tingling.  Objective:   Vitals:   10/15/17 1924 10/16/17 0011 10/16/17 0500 10/16/17 0749  BP: 111/69 106/67 (!) 142/78 (!) 155/80  Pulse: 84 84 77 81  Resp: 17 18  17   Temp: 99.1 F (37.3 C) 98.2 F (36.8 C) 98.6 F (37 C) 98.3 F (36.8 C)  TempSrc: Oral Oral Oral Oral  SpO2: 98% 95% 95% 94%  Weight:      Height:        Intake/Output Summary (Last 24 hours) at 10/16/2017 1113 Last data filed at 10/16/2017 1047 Gross per 24 hour  Intake 593 ml  Output 1875 ml  Net -1282 ml     Wt Readings from Last 3 Encounters:  10/15/17 109.6 kg (241 lb 10 oz)  11/12/16 100.2 kg (221 lb)  11/02/16 98.2 kg (216 lb 8 oz)     Exam  General: Alert and oriented x 3, NAD  Eyes:   HEENT:   Cardiovascular: S1 S2 auscultated, RRR  Respiratory: Decreased breath sound at the bases  Gastrointestinal: Soft, nontender, nondistended, + bowel sounds  Ext: no pedal edema bilaterally  Neuro: no new deficit  Musculoskeletal: No digital cyanosis, clubbing  Skin: No rashes  Psych: Normal affect and demeanor, alert and oriented x3    Data Reviewed:  I have personally reviewed following labs and imaging studies  Micro Results Recent Results (from the past 240 hour(s))  Blood Culture (routine x 2)      Status: None (Preliminary result)   Collection Time: 10/14/17  7:01 PM  Result Value Ref Range Status   Specimen Description BLOOD BLOOD RIGHT WRIST  Final   Special Requests   Final    IN BOTH AEROBIC AND ANAEROBIC BOTTLES Blood Culture adequate volume   Culture   Final  NO GROWTH < 24 HOURS Performed at [facility], [address] [place], [place] [address]    Report Status PENDING  Incomplete  Blood Culture (routine x 2)     Status: None (Preliminary result)   Collection Time: 10/14/17  7:29 PM  Result Value Ref Range Status   Specimen Description BLOOD RIGHT HAND  Final   Special Requests   Final    BOTTLES DRAWN AEROBIC AND ANAEROBIC Blood Culture adequate volume   Culture   Final    NO GROWTH < 24 HOURS Performed at [facility], [address] [place], [place] [address]    Report Status PENDING  Incomplete  Respiratory Panel by PCR     Status: Abnormal   Collection Time: 10/14/17  8:14 PM  Result Value Ref Range Status   Adenovirus NOT DETECTED NOT DETECTED Final   Coronavirus 229E NOT DETECTED NOT DETECTED Final   Coronavirus HKU1 NOT DETECTED NOT DETECTED Final   Coronavirus NL63 NOT DETECTED NOT DETECTED Final   Coronavirus OC43 NOT DETECTED NOT DETECTED Final   Metapneumovirus NOT DETECTED NOT DETECTED Final   Rhinovirus / Enterovirus DETECTED (A) NOT DETECTED Final   Influenza A NOT DETECTED NOT DETECTED Final   Influenza B NOT DETECTED NOT DETECTED Final   Parainfluenza Virus 1 NOT DETECTED NOT DETECTED Final   Parainfluenza Virus 2 NOT DETECTED NOT DETECTED Final   Parainfluenza Virus 3 NOT DETECTED NOT DETECTED Final   Parainfluenza Virus 4 NOT DETECTED NOT DETECTED Final   Respiratory Syncytial Virus NOT DETECTED NOT DETECTED Final   Bordetella pertussis NOT DETECTED NOT DETECTED Final   Chlamydophila pneumoniae NOT DETECTED NOT DETECTED Final   Mycoplasma pneumoniae NOT DETECTED NOT DETECTED Final  Culture, sputum-assessment     Status: None    Collection Time: 10/15/17  8:45 AM  Result Value Ref Range Status   Specimen Description SPUTUM  Final   Special Requests NONE  Final   Sputum evaluation   Final    Sputum specimen not acceptable for testing.  Please recollect.   Gram Stain Report Called to,Read Back By and Verified With: [facility] POLING RN AT 1358 ON [id number] BY SJW Performed at [facility], [address], [place], [place] [address]    Report Status 10/15/2017 FINAL  Final  MRSA PCR Screening     Status: None   Collection Time: 10/15/17 10:16 AM  Result Value Ref Range Status   MRSA by PCR NEGATIVE NEGATIVE Final    Comment:        The GeneXpert MRSA Assay (FDA approved for NASAL specimens only), is one component of a comprehensive MRSA colonization surveillance program. It is not intended to diagnose MRSA infection nor to guide or monitor treatment for MRSA infections. Performed at [facility], [address] [place], [place] [address]   Culture, expectorated sputum-assessment     Status: None (Preliminary result)   Collection Time: 10/15/17  9:47 PM  Result Value Ref Range Status   Specimen Description EXPECTORATED SPUTUM  Final   Special Requests NONE  Final   Sputum evaluation   Final    THIS SPECIMEN IS ACCEPTABLE FOR SPUTUM CULTURE Performed at [facility], [address], [place], [place] [address]    Report Status PENDING  Incomplete  Culture, respiratory (NON-Expectorated)     Status: None (Preliminary result)   Collection Time: 10/15/17  9:47 PM  Result Value Ref Range Status   Specimen  Description EXPECTORATED SPUTUM  Final   Special Requests NONE Reflexed from [id number]  Final   Gram Stain   Final    ABUNDANT WBC PRESENT, PREDOMINANTLY PMN RARE GRAM POSITIVE COCCI RARE YEAST Performed at [facility], [address], [place], [place] [address]    Culture PENDING  Incomplete   Report Status PENDING  Incomplete    Radiology Reports Dg Chest Port 1 View  Result  Date: 10/14/2017 CLINICAL DATA:  Loss of consciousness EXAM: PORTABLE CHEST 1 VIEW COMPARISON:  11/12/2016 FINDINGS: Cardiomegaly with vascular congestion. Chronic appearing bronchitic changes with linear scarring at the bases. No acute airspace disease. No pneumothorax. IMPRESSION: 1. Cardiomegaly with vascular congestion 2. Similar appearance of chronic interstitial disease and scarring at the bases. Electronically Signed   By: Jasmine Pang M.D.   On: 10/14/2017 19:36   Dg Chest Port 1v Same Day  Result Date: 10/15/2017 CLINICAL DATA:  Hypoxia EXAM: PORTABLE CHEST 1 VIEW COMPARISON:  10/14/2017, 11/12/2016, 10/10/2013, 10/27/2016 FINDINGS: Cardiomegaly. Streaky basilar atelectasis or scar. No pneumothorax. IMPRESSION: 1. Cardiomegaly 2. Streaky left greater than right basilar atelectasis or scar. Electronically Signed   By: Jasmine Pang M.D.   On: 10/15/2017 00:09    Lab Data:  CBC: Recent Labs  Lab 10/14/17 1845 10/15/17 0042  WBC 11.2* 11.3*  NEUTROABS 9.3*  --   HGB 11.0* 10.7*  HCT 34.2* 33.1*  MCV 86.1 86.2  PLT 255 229   Basic Metabolic Panel: Recent Labs  Lab 10/14/17 1845 10/15/17 0042  NA 132* 136  K 4.2 4.0  CL 97* 103  CO2 23 24  GLUCOSE 309* 262*  BUN 18 17  CREATININE 1.27* 1.13  CALCIUM 8.4* 7.8*   GFR: Estimated Creatinine Clearance: 74.9 mL/min (by C-G formula based on SCr of 1.13 mg/dL). Liver Function Tests: Recent Labs  Lab 10/14/17 1845  AST 17  ALT 18  ALKPHOS 111  BILITOT 0.6  PROT 7.0  ALBUMIN 3.3*   No results for input(s): LIPASE, AMYLASE in the last 168 hours. No results for input(s): AMMONIA in the last 168 hours. Coagulation Profile: No results for input(s): INR, PROTIME in the last 168 hours. Cardiac Enzymes: No results for input(s): CKTOTAL, CKMB, CKMBINDEX, TROPONINI in the last 168 hours. BNP (last 3 results) No results for input(s): PROBNP in the last 8760 hours. HbA1C: Recent Labs    10/15/17 1315  HGBA1C 10.6*    CBG: Recent Labs  Lab 10/15/17 0807 10/15/17 1113 10/15/17 1658 10/15/17 2109 10/16/17 0756  GLUCAP 263* 249* 219* 286* 245*   Lipid Profile: No results for input(s): CHOL, HDL, LDLCALC, TRIG, CHOLHDL, LDLDIRECT in the last 72 hours. Thyroid Function Tests: No results for input(s): TSH, T4TOTAL, FREET4, T3FREE, THYROIDAB in the last 72 hours. Anemia Panel: No results for input(s): VITAMINB12, FOLATE, FERRITIN, TIBC, IRON, RETICCTPCT in the last 72 hours. Urine analysis:    Component Value Date/Time   COLORURINE STRAW (A) 10/14/2017 2254   APPEARANCEUR CLEAR 10/14/2017 2254   LABSPEC 1.010 10/14/2017 2254   PHURINE 5.0 10/14/2017 2254   GLUCOSEU >=500 (A) 10/14/2017 2254   HGBUR SMALL (A) 10/14/2017 2254   BILIRUBINUR NEGATIVE 10/14/2017 2254   KETONESUR NEGATIVE 10/14/2017 2254   PROTEINUR NEGATIVE 10/14/2017 2254   UROBILINOGEN 1.0 05/04/2015 1415   NITRITE NEGATIVE 10/14/2017 2254   LEUKOCYTESUR NEGATIVE 10/14/2017 2254     Kimmora Risenhoover M.D. [facility] 10/16/2017, 11:13 AM  Pager: [phone] Between 7am to 7pm - call Pager - [phone]  After 7pm go to [email or web address] - password [email or web address]  Call night coverage person covering after 7pm

## 2017-10-16 NOTE — Progress Notes (Signed)
PHARMACIST - PHYSICIAN COMMUNICATION  CONCERNING: Antibiotic IV to Oral Route Change Policy  RECOMMENDATION: This patient is receiving azithromycin by the intravenous route.  Based on criteria approved by the Pharmacy and Therapeutics Committee, the antibiotic(s) is/are being converted to the equivalent oral dose form(s).   DESCRIPTION: These criteria include:  Patient being treated for a respiratory tract infection, urinary tract infection, cellulitis or clostridium difficile associated diarrhea if on metronidazole  The patient is not neutropenic and does not exhibit a GI malabsorption state  The patient is eating (either orally or via tube) and/or has been taking other orally administered medications for a least 24 hours  The patient is improving clinically and has a Tmax < 100.5  If you have questions about this conversion, please contact the Pharmacy Department  []   [phone]( [phone] )  [facility] []   [phone]( [phone] )  [facility] [x]   [phone]( [phone] )  [facility] []   [phone]( [phone]  [facility] []   [phone]( [phone] )  [facility]   Harland GermanAndrew Neno [place]hen[place], PharmD Clinical Pharmacist Clinical phone from 8:30-4:00 is [phone] After 4pm, please call Main Rx [phone]) for assistance. 10/16/2017 11:38 AM

## 2017-10-16 NOTE — Progress Notes (Signed)
Inpatient Diabetes Program Recommendations  AACE/ADA: New Consensus Statement on Inpatient Glycemic Control (2015)  Target Ranges:  Prepandial:   less than 140 mg/dL      Peak postprandial:   less than 180 mg/dL (1-2 hours)      Critically ill patients:  140 - 180 mg/dL   Lab Results  Component Value Date   GLUCAP 245 (H) 10/16/2017   HGBA1C 10.6 (H) 10/15/2017    Review of Glycemic Control Results for [name], [name] (MRN [id number]) as of 10/16/2017 09:26  Ref. Range 10/15/2017 08:07 10/15/2017 11:13 10/15/2017 16:58 10/15/2017 21:09 10/16/2017 07:56  Glucose-Capillary Latest Ref Range: 65 - 99 mg/dL 829263 (H) 562249 (H) 130219 (H) 286 (H) 245 (H)   Diabetes history: DM2 Outpatient Diabetes medications: Glipizide 5 mg QAM, Metformin XR 500 mg BID Current orders for Inpatient glycemic control: Novolog 0-15 units TID with meals + hs  Inpatient Diabetes Program Recommendations:  Noted hyperglycemia. Consider Lantus 20 units qd (109.6 kg x 0.2 units/kg=22 units)  Thank you, Darel HongJudy E. Amire Leazer, RN, MSN, CDE  Diabetes Coordinator Inpatient Glycemic Control Team Team Pager [phone] (8am-5pm) 10/16/2017 10:03 AM

## 2017-10-17 LAB — CBC
HEMATOCRIT: 33.4 % — AB (ref 39.0–52.0)
HEMOGLOBIN: 10.8 g/dL — AB (ref 13.0–17.0)
MCH: 28.1 pg (ref 26.0–34.0)
MCHC: 32.3 g/dL (ref 30.0–36.0)
MCV: 86.8 fL (ref 78.0–100.0)
Platelets: 259 10*3/uL (ref 150–400)
RBC: 3.85 MIL/uL — AB (ref 4.22–5.81)
RDW: 14.1 % (ref 11.5–15.5)
WBC: 9.8 10*3/uL (ref 4.0–10.5)

## 2017-10-17 LAB — BASIC METABOLIC PANEL
Anion gap: 10 (ref 5–15)
BUN: 11 mg/dL (ref 6–20)
CHLORIDE: 101 mmol/L (ref 101–111)
CO2: 25 mmol/L (ref 22–32)
CREATININE: 1.05 mg/dL (ref 0.61–1.24)
Calcium: 7.7 mg/dL — ABNORMAL LOW (ref 8.9–10.3)
GFR calc Af Amer: >60 mL/min (ref >60)
GFR calc non Af Amer: >60 mL/min (ref >60)
Glucose, Bld: 228 mg/dL — ABNORMAL HIGH (ref 65–99)
POTASSIUM: 4.1 mmol/L (ref 3.5–5.1)
Sodium: 136 mmol/L (ref 135–145)

## 2017-10-17 LAB — GLUCOSE, CAPILLARY
GLUCOSE-CAPILLARY: 183 mg/dL — AB (ref 65–99)
GLUCOSE-CAPILLARY: 215 mg/dL — AB (ref 65–99)
Glucose-Capillary: 216 mg/dL — ABNORMAL HIGH (ref 65–99)
Glucose-Capillary: 186 mg/dL — ABNORMAL HIGH (ref 65–99)

## 2017-10-17 MED ORDER — INSULIN GLARGINE 100 UNIT/ML ~~LOC~~ SOLN
22.0000 [IU] | Freq: Every day | SUBCUTANEOUS | Status: DC
  Administered 2017-10-17: 22 [IU] via SUBCUTANEOUS
  Filled 2017-10-17 (×2): qty 0.22

## 2017-10-17 MED ORDER — INSULIN ASPART 100 UNIT/ML ~~LOC~~ SOLN
4.0000 [IU] | Freq: Three times a day (TID) | SUBCUTANEOUS | Status: DC
  Administered 2017-10-17 – 2017-10-18 (×4): 4 [IU] via SUBCUTANEOUS

## 2017-10-17 MED ORDER — METOCLOPRAMIDE HCL 5 MG PO TABS
5.0000 mg | ORAL_TABLET | Freq: Three times a day (TID) | ORAL | Status: DC
  Administered 2017-10-17 – 2017-10-18 (×4): 5 mg via ORAL
  Filled 2017-10-17 (×4): qty 1

## 2017-10-17 NOTE — Progress Notes (Signed)
[facility]                                                                              Patient Demographics  [name], is a 67 y.o. male, DOB - 15-Feb-1951, WUJ:[id number]  Admit date - 10/14/2017   Admitting Physician Hillary Bow, DO  Outpatient Primary MD for the patient is Clinic, Medical, MD (Inactive)  Outpatient specialists:   LOS - 3  days   Medical records reviewed and are as summarized below:    Chief Complaint  Patient presents with  . Altered Mental Status       Brief summary   Patient is a 67 y.o. male with DM2, chronic peripheral edema, chronic systolic CHF with moderate AR resented to ED with decreased responsiveness and lethargy.  Patient's wife reported that he had been sleeping since 3 PM on the day of admission and she was unable to wake him up.  She also noticed him to be confused.  Temp was 101.5 F. Wife and patient, grandkids also had same URI symptoms with cough and congestion. Patient was admitted for presumed pneumonia  Assessment & Plan    Principal Problem:   Acute respiratory failure with hypoxia (HCC) with sepsis, likely viral URI - in ED, patient had mild hypoxia with O2 sats 90% on room air,leukocytosis 11.2, creatinine 1.2, lactic acidosis, febrile, source possibly pneumonia -Patient received IV fluid hydration, pro calcitonin less than 0.1, lactic acid 2.6, improved to 1.6 -Blood cultures, sputum cultures negative so far.  Urine strep antigen negative.  Influenza panel negative.  Respiratory virus panel showed rhinovirus/enterovirus -Continue IV Zithromax, Rocephin, transition to oral antibiotics in a.m. leukocytosis improving, -Home O2 evaluation prior to discharge.  Active Problems:   Diabetes mellitus (HCC) type II, on oral hypoglycemic, uncontrolled with hyperglycemia -Hemoglobin A1c 10.6, uncontrolled diabetes  -CBG still elevated.  Increase Lantus 22 units daily, NovoLog meal coverage 4 units 3 times daily  AC, sliding scale insulin  -Hold glipizide and metformin  Nausea without vomiting -Unclear etiology, has a history of GERD, placed on PPI.  Could have gastroparesis from underlying uncontrolled diabetes -Placed on Reglan TID AC for trial.  Wants to stay on solid diet.    HTN (hypertension) -BP currently stable, continue current Coreg    Hyperlipidemia Continue Lipitor    Delirium/acute metabolic encephalopathy -Improving, currently alert and oriented, overnight had some confusion issues.  Currently at baseline per wife at the bedside.    Acute urinary retention history of BPH -In ED bladder scan showed more than 500 cc, patient Foley catheter was placed.  UA negative for UTI -Restarted Flomax, finasteride -Voiding trial in a.m.  Code Status: Full CODE STATUS DVT Prophylaxis:  Lovenox  Family Communication: Discussed in detail with the patient, all imaging results, lab results explained to the patient and patient's wife at the bedside  Disposition Plan: When medically stable, possible DC 24 hours  Time Spent in minutes 25 minutes    Procedures:  Plan  Consultants:   None  Antimicrobials:   IV Zithromax 4/15  IV Rocephin 4/15   Medications  Scheduled Meds: . aspirin EC  81  mg Oral Daily  . atorvastatin  20 mg Oral QPM  . azithromycin  500 mg Oral QHS  . carvedilol  6.25 mg Oral BID WC  . enoxaparin (LOVENOX) injection  40 mg Subcutaneous Daily  . finasteride  5 mg Oral Daily  . gabapentin  300 mg Oral TID AC & HS  . hydrOXYzine  50 mg Oral TID  . insulin aspart  0-15 Units Subcutaneous TID WC  . insulin aspart  0-5 Units Subcutaneous QHS  . insulin aspart  3 Units Subcutaneous TID WC  . insulin glargine  20 Units Subcutaneous QHS  . montelukast  10 mg Oral QHS  . pantoprazole  40 mg Oral Daily  . potassium chloride SA  20 mEq Oral Daily  . sodium chloride flush  3 mL Intravenous Q12H  . tamsulosin  0.4 mg Oral Daily   Continuous Infusions: . sodium  chloride    . cefTRIAXone (ROCEPHIN)  IV Stopped (10/17/17 0009)   PRN Meds:.sodium chloride, acetaminophen, alum & mag hydroxide-simeth, ondansetron (ZOFRAN) IV, sodium chloride flush   Antibiotics   Anti-infectives (From admission, onward)   Start     Dose/Rate Route Frequency Ordered Stop   10/16/17 2200  azithromycin (ZITHROMAX) tablet 500 mg     500 mg Oral Daily at bedtime 10/16/17 1137 10/21/17 2159   10/15/17 0100  cefTRIAXone (ROCEPHIN) 1 g in sodium chloride 0.9 % 100 mL IVPB     1 g 200 mL/hr over 30 Minutes Intravenous Daily at bedtime 10/14/17 2337 10/21/17 2159   10/15/17 0030  azithromycin (ZITHROMAX) 500 mg in sodium chloride 0.9 % 250 mL IVPB  Status:  Discontinued     500 mg 250 mL/hr over 60 Minutes Intravenous Daily at bedtime 10/14/17 2337 10/16/17 1137   10/14/17 2030  piperacillin-tazobactam (ZOSYN) IVPB 3.375 g     3.375 g 100 mL/hr over 30 Minutes Intravenous  Once 10/14/17 2020 10/14/17 2117   10/14/17 2030  vancomycin (VANCOCIN) IVPB 1000 mg/200 mL premix     1,000 mg 200 mL/hr over 60 Minutes Intravenous  Once 10/14/17 2020 10/14/17 2139        Subjective:   [name] was seen and examined today.  States has no significant complaints except nausea and did not feel like eating.  No vomiting, no abdominal pain or diarrhea, fevers or chills.   Patient denies dizziness, chest pain,  abdominal pain, new weakness, numbess, tingling.  Objective:   Vitals:   10/16/17 1826 10/16/17 2215 10/17/17 0724 10/17/17 0750  BP: (!) 147/80 (!) 152/95 (!) 165/87 (!) 160/90  Pulse: 84 85 91 93  Resp: 18 (!) 24 (!) 21 18  Temp: 98.3 F (36.8 C) 99 F (37.2 C) 98.2 F (36.8 C) 98.4 F (36.9 C)  TempSrc: Oral Oral Oral Oral  SpO2: 97% 93% 93% 92%  Weight:      Height:        Intake/Output Summary (Last 24 hours) at 10/17/2017 1201 Last data filed at 10/17/2017 1054 Gross per 24 hour  Intake 103 ml  Output 1950 ml  Net -1847 ml     Wt Readings from  Last 3 Encounters:  10/15/17 109.6 kg (241 lb 10 oz)  11/12/16 100.2 kg (221 lb)  11/02/16 98.2 kg (216 lb 8 oz)     Exam   General: Alert and oriented x 3, NAD  Eyes:   HEENT:  Atraumatic, normocephalic  Cardiovascular: S1 S2 auscultated, . Regular rate and rhythm. No pedal edema b/l  Respiratory: Fairly CTA B  Gastrointestinal: Soft, nontender, nondistended, + bowel sounds  Ext: no pedal edema bilaterally  Neuro: no new deficits  Musculoskeletal: No digital cyanosis, clubbing  Skin: No rashes  Psych: Normal affect and demeanor, alert and oriented x3    Data Reviewed:  I have personally reviewed following labs and imaging studies  Micro Results Recent Results (from the past 240 hour(s))  Blood Culture (routine x 2)     Status: None (Preliminary result)   Collection Time: 10/14/17  7:01 PM  Result Value Ref Range Status   Specimen Description BLOOD BLOOD RIGHT WRIST  Final   Special Requests   Final    IN BOTH AEROBIC AND ANAEROBIC BOTTLES Blood Culture adequate volume   Culture   Final    NO GROWTH 2 DAYS Performed at [facility], [address] [place], [place] [address]    Report Status PENDING  Incomplete  Blood Culture (routine x 2)     Status: None (Preliminary result)   Collection Time: 10/14/17  7:29 PM  Result Value Ref Range Status   Specimen Description BLOOD RIGHT HAND  Final   Special Requests   Final    BOTTLES DRAWN AEROBIC AND ANAEROBIC Blood Culture adequate volume   Culture   Final    NO GROWTH 2 DAYS Performed at [facility], [address] [place], [place] [address]    Report Status PENDING  Incomplete  Respiratory Panel by PCR     Status: Abnormal   Collection Time: 10/14/17  8:14 PM  Result Value Ref Range Status   Adenovirus NOT DETECTED NOT DETECTED Final   Coronavirus 229E NOT DETECTED NOT DETECTED Final   Coronavirus HKU1 NOT DETECTED NOT DETECTED Final   Coronavirus NL63 NOT DETECTED NOT DETECTED Final    Coronavirus OC43 NOT DETECTED NOT DETECTED Final   Metapneumovirus NOT DETECTED NOT DETECTED Final   Rhinovirus / Enterovirus DETECTED (A) NOT DETECTED Final   Influenza A NOT DETECTED NOT DETECTED Final   Influenza B NOT DETECTED NOT DETECTED Final   Parainfluenza Virus 1 NOT DETECTED NOT DETECTED Final   Parainfluenza Virus 2 NOT DETECTED NOT DETECTED Final   Parainfluenza Virus 3 NOT DETECTED NOT DETECTED Final   Parainfluenza Virus 4 NOT DETECTED NOT DETECTED Final   Respiratory Syncytial Virus NOT DETECTED NOT DETECTED Final   Bordetella pertussis NOT DETECTED NOT DETECTED Final   Chlamydophila pneumoniae NOT DETECTED NOT DETECTED Final   Mycoplasma pneumoniae NOT DETECTED NOT DETECTED Final  Culture, sputum-assessment     Status: None   Collection Time: 10/15/17  8:45 AM  Result Value Ref Range Status   Specimen Description SPUTUM  Final   Special Requests NONE  Final   Sputum evaluation   Final    Sputum specimen not acceptable for testing.  Please recollect.   Gram Stain Report Called to,Read Back By and Verified With: [facility] POL[facility] RN AT 1358 ON [id number] BY SJW Performed at [facility], [address] [place], [place] [address]    Report Status 10/15/2017 FINAL  Final  MRSA PCR Screening     Status: None   Collection Time: 10/15/17 10:16 AM  Result Value Ref Range Status   MRSA by PCR NEGATIVE NEGATIVE Final    Comment:        The GeneXpert MRSA Assay (FDA approved for NASAL specimens only), is one component of a comprehensive MRSA colonization surveillance program. It is not intended to diagnose  MRSA infection nor to guide or monitor treatment for MRSA infections. Performed at [facility], [address], [place], [place] [address]   Culture, expectorated sputum-assessment     Status: None   Collection Time: 10/15/17  9:47 PM  Result Value Ref Range Status   Specimen Description EXPECTORATED SPUTUM  Final   Special Requests NONE  Final   Sputum  evaluation   Final    THIS SPECIMEN IS ACCEPTABLE FOR SPUTUM CULTURE Performed at [facility], [address] [place], [place] [address]    Report Status 10/16/2017 FINAL  Final  Culture, respiratory (NON-Expectorated)     Status: None (Preliminary result)   Collection Time: 10/15/17  9:47 PM  Result Value Ref Range Status   Specimen Description EXPECTORATED SPUTUM  Final   Special Requests NONE Reflexed from [id number]  Final   Gram Stain   Final    ABUNDANT WBC PRESENT, PREDOMINANTLY PMN RARE GRAM POSITIVE COCCI RARE YEAST    Culture   Final    CULTURE REINCUBATED FOR BETTER GROWTH Performed at [facility], [address] [place], [place] [address]    Report Status PENDING  Incomplete    Radiology Reports Dg Chest Port 1 View  Result Date: 10/14/2017 CLINICAL DATA:  Loss of consciousness EXAM: PORTABLE CHEST 1 VIEW COMPARISON:  11/12/2016 FINDINGS: Cardiomegaly with vascular congestion. Chronic appearing bronchitic changes with linear scarring at the bases. No acute airspace disease. No pneumothorax. IMPRESSION: 1. Cardiomegaly with vascular congestion 2. Similar appearance of chronic interstitial disease and scarring at the bases. Electronically Signed   By: Jasmine Pang M.D.   On: 10/14/2017 19:36   Dg Chest Port 1v Same Day  Result Date: 10/15/2017 CLINICAL DATA:  Hypoxia EXAM: PORTABLE CHEST 1 VIEW COMPARISON:  10/14/2017, 11/12/2016, 10/10/2013, 10/27/2016 FINDINGS: Cardiomegaly. Streaky basilar atelectasis or scar. No pneumothorax. IMPRESSION: 1. Cardiomegaly 2. Streaky left greater than right basilar atelectasis or scar. Electronically Signed   By: Jasmine Pang M.D.   On: 10/15/2017 00:09    Lab Data:  CBC: Recent Labs  Lab 10/14/17 1845 10/15/17 0042 10/17/17 0631  WBC 11.2* 11.3* 9.8  NEUTROABS 9.3*  --   --   HGB 11.0* 10.7* 10.8*  HCT 34.2* 33.1* 33.4*  MCV 86.1 86.2 86.8  PLT 255 229 259   Basic Metabolic Panel: Recent Labs  Lab  10/14/17 1845 10/15/17 0042 10/17/17 0631  NA 132* 136 136  K 4.2 4.0 4.1  CL 97* 103 101  CO2 23 24 25   GLUCOSE 309* 262* 228*  BUN 18 17 11   CREATININE 1.27* 1.13 1.05  CALCIUM 8.4* 7.8* 7.7*   GFR: Estimated Creatinine Clearance: 80.6 mL/min (by C-G formula based on SCr of 1.05 mg/dL). Liver Function Tests: Recent Labs  Lab 10/14/17 1845  AST 17  ALT 18  ALKPHOS 111  BILITOT 0.6  PROT 7.0  ALBUMIN 3.3*   No results for input(s): LIPASE, AMYLASE in the last 168 hours. No results for input(s): AMMONIA in the last 168 hours. Coagulation Profile: No results for input(s): INR, PROTIME in the last 168 hours. Cardiac Enzymes: No results for input(s): CKTOTAL, CKMB, CKMBINDEX, TROPONINI in the last 168 hours. BNP (last 3 results) No results for input(s): PROBNP in the last 8760 hours. HbA1C: Recent Labs    10/15/17 1315  HGBA1C 10.6*   CBG: Recent Labs  Lab 10/16/17 1208 10/16/17 1656 10/16/17 2213 10/17/17 0723 10/17/17 1142  GLUCAP 189* 249* 236* 216* 183*  Lipid Profile: No results for input(s): CHOL, HDL, LDLCALC, TRIG, CHOLHDL, LDLDIRECT in the last 72 hours. Thyroid Function Tests: No results for input(s): TSH, T4TOTAL, FREET4, T3FREE, THYROIDAB in the last 72 hours. Anemia Panel: No results for input(s): VITAMINB12, FOLATE, FERRITIN, TIBC, IRON, RETICCTPCT in the last 72 hours. Urine analysis:    Component Value Date/Time   COLORURINE STRAW (A) 10/14/2017 2254   APPEARANCEUR CLEAR 10/14/2017 2254   LABSPEC 1.010 10/14/2017 2254   PHURINE 5.0 10/14/2017 2254   GLUCOSEU >=500 (A) 10/14/2017 2254   HGBUR SMALL (A) 10/14/2017 2254   BILIRUBINUR NEGATIVE 10/14/2017 2254   KETONESUR NEGATIVE 10/14/2017 2254   PROTEINUR NEGATIVE 10/14/2017 2254   UROBILINOGEN 1.0 05/04/2015 1415   NITRITE NEGATIVE 10/14/2017 2254   LEUKOCYTESUR NEGATIVE 10/14/2017 2254     Kina Shiffman M.D. [facility] 10/17/2017, 12:01 PM  Pager: [phone] Between 7am to  7pm - call Pager - [phone]  After 7pm go to [email or web address] - password [email or web address]  Call night coverage person covering after 7pm

## 2017-10-18 DIAGNOSIS — J9601 Acute respiratory failure with hypoxia: Secondary | ICD-10-CM

## 2017-10-18 DIAGNOSIS — E785 Hyperlipidemia, unspecified: Secondary | ICD-10-CM

## 2017-10-18 DIAGNOSIS — R410 Disorientation, unspecified: Secondary | ICD-10-CM

## 2017-10-18 DIAGNOSIS — R338 Other retention of urine: Secondary | ICD-10-CM

## 2017-10-18 DIAGNOSIS — E119 Type 2 diabetes mellitus without complications: Secondary | ICD-10-CM

## 2017-10-18 DIAGNOSIS — I10 Essential (primary) hypertension: Secondary | ICD-10-CM

## 2017-10-18 DIAGNOSIS — A419 Sepsis, unspecified organism: Principal | ICD-10-CM

## 2017-10-18 LAB — CBC
HEMATOCRIT: 31 % — AB (ref 39.0–52.0)
Hemoglobin: 9.7 g/dL — ABNORMAL LOW (ref 13.0–17.0)
MCH: 27.4 pg (ref 26.0–34.0)
MCHC: 31.3 g/dL (ref 30.0–36.0)
MCV: 87.6 fL (ref 78.0–100.0)
Platelets: 247 10*3/uL (ref 150–400)
RBC: 3.54 MIL/uL — ABNORMAL LOW (ref 4.22–5.81)
RDW: 14.2 % (ref 11.5–15.5)
WBC: 7.8 10*3/uL (ref 4.0–10.5)

## 2017-10-18 LAB — CULTURE, RESPIRATORY: CULTURE: NORMAL

## 2017-10-18 LAB — BASIC METABOLIC PANEL
Anion gap: 9 (ref 5–15)
BUN: 14 mg/dL (ref 6–20)
CALCIUM: 7.5 mg/dL — AB (ref 8.9–10.3)
CO2: 27 mmol/L (ref 22–32)
CREATININE: 1.23 mg/dL (ref 0.61–1.24)
Chloride: 102 mmol/L (ref 101–111)
GFR calc Af Amer: >60 mL/min (ref >60)
GFR calc non Af Amer: 59 mL/min — ABNORMAL LOW (ref >60)
GLUCOSE: 150 mg/dL — AB (ref 65–99)
Potassium: 4 mmol/L (ref 3.5–5.1)
Sodium: 138 mmol/L (ref 135–145)

## 2017-10-18 LAB — GLUCOSE, CAPILLARY
Glucose-Capillary: 249 mg/dL — ABNORMAL HIGH (ref 65–99)
Glucose-Capillary: 223 mg/dL — ABNORMAL HIGH (ref 65–99)
Glucose-Capillary: 178 mg/dL — ABNORMAL HIGH (ref 65–99)

## 2017-10-18 LAB — CULTURE, RESPIRATORY W GRAM STAIN

## 2017-10-18 MED ORDER — AZITHROMYCIN 500 MG PO TABS: 500.0000 mg | ORAL_TABLET | Freq: Every day | ORAL | 0 refills | Status: DC

## 2017-10-18 MED ORDER — GUAIFENESIN ER 600 MG PO TB12: 600.0000 mg | ORAL_TABLET | Freq: Two times a day (BID) | ORAL | 2 refills | Status: DC

## 2017-10-18 MED ORDER — ALBUTEROL SULFATE HFA 108 (90 BASE) MCG/ACT IN AERS: 2.0000 | INHALATION_SPRAY | Freq: Four times a day (QID) | RESPIRATORY_TRACT | 2 refills | Status: DC | PRN

## 2017-10-18 MED ORDER — METOCLOPRAMIDE HCL 5 MG PO TABS: 5.0000 mg | ORAL_TABLET | Freq: Three times a day (TID) | ORAL | 0 refills | Status: DC | PRN

## 2017-10-18 NOTE — Progress Notes (Signed)
Results for [name], [name] (MRN [id number]) as of 10/18/2017 12:36  Ref. Range 10/17/2017 11:42 10/17/2017 17:03 10/17/2017 21:30 10/18/2017 08:10 10/18/2017 12:13  Glucose-Capillary Latest Ref Range: 65 - 99 mg/dL 324183 (H) 401186 (H) 027215 (H) 249 (H) 223 (H)  Noted that blood sugars continue to be greater than 180 mg/dl. Recommend increasing Novolog to 6 units TID with meals if blood sugars continue to be elevated.   Will continue to monitor blood sugars while in the hospital.  Smith MinceKendra Leotis Isham RN BSN CDE Diabetes Coordinator Pager: [phone]  8am-5pm

## 2017-10-18 NOTE — Discharge Summary (Signed)
Physician Discharge Summary  [name] ZOX:[id number] DOB: Oct 23, 1950 DOA: 10/14/2017  PCP: [facility], MD (Inactive)  Admit date: 10/14/2017 Discharge date: 10/18/2017  Admitted From: Home Disposition: Home  Recommendations for Outpatient Follow-up:  1. Follow up with PCP in 1-2 weeks 2. Please obtain BMP/CBC in one week  Discharge Condition: Stable CODE STATUS: Full code Diet recommendation: Heart Healthy / Carb Modified   Brief/Interim Summary: 67 year old male with a history of diabetes, chronic peripheral edema, presented with decreased responsiveness and lethargy.  He was noted to be febrile with temperature of 101.5.  His wife and his grandchildren had upper respiratory tract symptoms.  Patient was also noted to have cough and likely upper respiratory tract infection.  He was admitted for further management.  Patient initially required supplemental oxygen.  He was weaned off and is breathing comfortably on room air.  Influenza panel was negative.  Virus panel was positive for rhinovirus/enterovirus.  He was treated with intravenous antibiotics, but has been transitioned to oral azithromycin.  Leukocytosis has resolved.  He was adequately hydrated with IV fluids.  Patient is feeling significantly better and is no longer having any significant upper respiratory tract symptoms.  He did develop some nausea and vomiting which improved with addition of Reglan.  He will be discharged on Reglan as needed.  In the ED he was also noted to have acute urinary retention with 500 cc of urine in his bladder.  Foley catheter was placed.  He was restarted on Flomax and finasteride.  At the time of discharge, catheter has been removed and he does not have any significant postvoid residual.  He is able to pass some urine.  Patient is feeling significantly better, mental status has returned to baseline and he feels ready for discharge home.  His valsartan/hydrochlorothiazide is currently on hold since  blood pressures were initially low and currently normotensive.  This can be restarted as an outpatient as felt appropriate.  He also be restarted on glipizide metformin on discharge.  Discharge Diagnoses:  Principal Problem:   Acute respiratory failure with hypoxia (HCC) Active Problems:   Diabetes mellitus (HCC)   HTN (hypertension)   Hyperlipidemia   Delirium   Acute urinary retention    Discharge Instructions  Discharge Instructions    Diet - low sodium heart healthy   Complete by:  As directed    Increase activity slowly   Complete by:  As directed      Allergies as of 10/18/2017      Reactions   Cashew Nut Oil Palpitations   Percocet [oxycodone-acetaminophen] Other (See Comments)   Hallucintaions      Medication List    STOP taking these medications   valsartan-hydrochlorothiazide 320-25 MG tablet Commonly known as:  DIOVAN-HCT     TAKE these medications   albuterol 108 (90 Base) MCG/ACT inhaler Commonly known as:  PROVENTIL HFA;VENTOLIN HFA Inhale 2 puffs into the lungs every 6 (six) hours as needed for wheezing or shortness of breath.   aspirin EC 81 MG tablet Take 81 mg by mouth daily.   atorvastatin 20 MG tablet Commonly known as:  LIPITOR Take 20 mg by mouth every evening.   azithromycin 500 MG tablet Commonly known as:  ZITHROMAX Take 1 tablet (500 mg total) by mouth daily.   carvedilol 6.25 MG tablet Commonly known as:  COREG Take 1 tablet (6.25 mg total) by mouth 2 (two) times daily with a meal.   finasteride 5 MG tablet Commonly known as:  PROSCAR  Take 5 mg by mouth daily.   gabapentin 300 MG capsule Commonly known as:  NEURONTIN Take 600 mg by mouth 2 (two) times daily.   glipiZIDE 5 MG tablet Commonly known as:  GLUCOTROL Take 5 mg by mouth daily before breakfast.   guaiFENesin 600 MG 12 hr tablet Commonly known as:  MUCINEX Take 1 tablet (600 mg total) by mouth 2 (two) times daily.   hydrOXYzine 50 MG capsule Commonly known as:   VISTARIL Take 50 mg by mouth 3 (three) times daily.   metFORMIN 500 MG 24 hr tablet Commonly known as:  GLUCOPHAGE-XR Take 500 mg by mouth 2 (two) times daily.   metoCLOPramide 5 MG tablet Commonly known as:  REGLAN Take 1 tablet (5 mg total) by mouth every 8 (eight) hours as needed for nausea.   montelukast 10 MG tablet Commonly known as:  SINGULAIR Take 10 mg by mouth at bedtime.   pantoprazole 40 MG tablet Commonly known as:  PROTONIX Take 1 tablet (40 mg total) by mouth daily.   tamsulosin 0.4 MG Caps capsule Commonly known as:  FLOMAX Take 0.4 mg by mouth daily.       Allergies  Allergen Reactions  . Cashew Nut Oil Palpitations  . Percocet [Oxycodone-Acetaminophen] Other (See Comments)    Hallucintaions    Consultations:     Procedures/Studies: Dg Chest Port 1 View  Result Date: 10/14/2017 CLINICAL DATA:  Loss of consciousness EXAM: PORTABLE CHEST 1 VIEW COMPARISON:  11/12/2016 FINDINGS: Cardiomegaly with vascular congestion. Chronic appearing bronchitic changes with linear scarring at the bases. No acute airspace disease. No pneumothorax. IMPRESSION: 1. Cardiomegaly with vascular congestion 2. Similar appearance of chronic interstitial disease and scarring at the bases. Electronically Signed   By: Jasmine Pang M.D.   On: 10/14/2017 19:36   Dg Chest Port 1v Same Day  Result Date: 10/15/2017 CLINICAL DATA:  Hypoxia EXAM: PORTABLE CHEST 1 VIEW COMPARISON:  10/14/2017, 11/12/2016, 10/10/2013, 10/27/2016 FINDINGS: Cardiomegaly. Streaky basilar atelectasis or scar. No pneumothorax. IMPRESSION: 1. Cardiomegaly 2. Streaky left greater than right basilar atelectasis or scar. Electronically Signed   By: Jasmine Pang M.D.   On: 10/15/2017 00:09       Subjective: Patient is feeling better today.  Overall nausea and vomiting has resolved.  He is tolerating a solid diet.  Shortness of breath has resolved.  He is able to ambulate on room air without  difficulty.  Discharge Exam: Vitals:   10/18/17 0812 10/18/17 1613  BP: 131/75 136/78  Pulse: 96 90  Resp: (!) 24 17  Temp: 97.8 F (36.6 C) 98 F (36.7 C)  SpO2: 91% 92%   Vitals:   10/17/17 0750 10/17/17 2334 10/18/17 0812 10/18/17 1613  BP: (!) 160/90 115/66 131/75 136/78  Pulse: 93 89 96 90  Resp: 18 20 (!) 24 17  Temp: 98.4 F (36.9 C) (!) 97.5 F (36.4 C) 97.8 F (36.6 C) 98 F (36.7 C)  TempSrc: Oral Oral Oral Oral  SpO2: 92% 94% 91% 92%  Weight:      Height:        General: Pt is alert, awake, not in acute distress Cardiovascular: RRR, S1/S2 +, no rubs, no gallops Respiratory: CTA bilaterally, no wheezing, no rhonchi Abdominal: Soft, NT, ND, bowel sounds + Extremities: no edema, no cyanosis    The results of significant diagnostics from this hospitalization (including imaging, microbiology, ancillary and laboratory) are listed below for reference.     Microbiology: Recent Results (from the past 240  hour(s))  Blood Culture (routine x 2)     Status: None (Preliminary result)   Collection Time: 10/14/17  7:01 PM  Result Value Ref Range Status   Specimen Description BLOOD BLOOD RIGHT WRIST  Final   Special Requests   Final    IN BOTH AEROBIC AND ANAEROBIC BOTTLES Blood Culture adequate volume   Culture   Final    NO GROWTH 4 DAYS Performed at [facility], [address] [place], [place] [address]    Report Status PENDING  Incomplete  Blood Culture (routine x 2)     Status: None (Preliminary result)   Collection Time: 10/14/17  7:29 PM  Result Value Ref Range Status   Specimen Description BLOOD RIGHT HAND  Final   Special Requests   Final    BOTTLES DRAWN AEROBIC AND ANAEROBIC Blood Culture adequate volume   Culture   Final    NO GROWTH 4 DAYS Performed at [facility], [address] [place], [place] [address]    Report Status PENDING  Incomplete  Respiratory Panel by PCR     Status: Abnormal   Collection Time: 10/14/17  8:14 PM   Result Value Ref Range Status   Adenovirus NOT DETECTED NOT DETECTED Final   Coronavirus 229E NOT DETECTED NOT DETECTED Final   Coronavirus HKU1 NOT DETECTED NOT DETECTED Final   Coronavirus NL63 NOT DETECTED NOT DETECTED Final   Coronavirus OC43 NOT DETECTED NOT DETECTED Final   Metapneumovirus NOT DETECTED NOT DETECTED Final   Rhinovirus / Enterovirus DETECTED (A) NOT DETECTED Final   Influenza A NOT DETECTED NOT DETECTED Final   Influenza B NOT DETECTED NOT DETECTED Final   Parainfluenza Virus 1 NOT DETECTED NOT DETECTED Final   Parainfluenza Virus 2 NOT DETECTED NOT DETECTED Final   Parainfluenza Virus 3 NOT DETECTED NOT DETECTED Final   Parainfluenza Virus 4 NOT DETECTED NOT DETECTED Final   Respiratory Syncytial Virus NOT DETECTED NOT DETECTED Final   Bordetella pertussis NOT DETECTED NOT DETECTED Final   Chlamydophila pneumoniae NOT DETECTED NOT DETECTED Final   Mycoplasma pneumoniae NOT DETECTED NOT DETECTED Final  Culture, sputum-assessment     Status: None   Collection Time: 10/15/17  8:45 AM  Result Value Ref Range Status   Specimen Description SPUTUM  Final   Special Requests NONE  Final   Sputum evaluation   Final    Sputum specimen not acceptable for testing.  Please recollect.   Gram Stain Report Called to,Read Back By and Verified With: [facility] POL[facility] RN AT 1358 ON [id number] BY SJW Performed at [facility], [address] [place], [place] [address]    Report Status 10/15/2017 FINAL  Final  MRSA PCR Screening     Status: None   Collection Time: 10/15/17 10:16 AM  Result Value Ref Range Status   MRSA by PCR NEGATIVE NEGATIVE Final    Comment:        The GeneXpert MRSA Assay (FDA approved for NASAL specimens only), is one component of a comprehensive MRSA colonization surveillance program. It is not intended to diagnose MRSA infection nor to guide or monitor treatment for MRSA infections. Performed at [facility], [address] [place],  [place] [address]   Culture, expectorated sputum-assessment     Status: None   Collection Time: 10/15/17  9:47 PM  Result Value Ref Range Status   Specimen Description EXPECTORATED SPUTUM  Final   Special Requests NONE  Final  Sputum evaluation   Final    THIS SPECIMEN IS ACCEPTABLE FOR SPUTUM CULTURE Performed at [facility], [address] [place], [place] [address]    Report Status 10/16/2017 FINAL  Final  Culture, respiratory (NON-Expectorated)     Status: None   Collection Time: 10/15/17  9:47 PM  Result Value Ref Range Status   Specimen Description EXPECTORATED SPUTUM  Final   Special Requests NONE Reflexed from [id number]  Final   Gram Stain   Final    ABUNDANT WBC PRESENT, PREDOMINANTLY PMN RARE GRAM POSITIVE COCCI RARE YEAST    Culture   Final    Consistent with normal respiratory flora. Performed at [facility], [address], [place], [place] [address]    Report Status 10/18/2017 FINAL  Final     Labs: BNP (last 3 results) Recent Labs    10/27/16 2022 10/14/17 1845  BNP 495.2* 116.6*   Basic Metabolic Panel: Recent Labs  Lab 10/14/17 1845 10/15/17 0042 10/17/17 0631 10/18/17 0323  NA 132* 136 136 138  K 4.2 4.0 4.1 4.0  CL 97* 103 101 102  CO2 23 24 25 27   GLUCOSE 309* 262* 228* 150*  BUN 18 17 11 14   CREATININE 1.27* 1.13 1.05 1.23  CALCIUM 8.4* 7.8* 7.7* 7.5*   Liver Function Tests: Recent Labs  Lab 10/14/17 1845  AST 17  ALT 18  ALKPHOS 111  BILITOT 0.6  PROT 7.0  ALBUMIN 3.3*   No results for input(s): LIPASE, AMYLASE in the last 168 hours. No results for input(s): AMMONIA in the last 168 hours. CBC: Recent Labs  Lab 10/14/17 1845 10/15/17 0042 10/17/17 0631 10/18/17 0323  WBC 11.2* 11.3* 9.8 7.8  NEUTROABS 9.3*  --   --   --   HGB 11.0* 10.7* 10.8* 9.7*  HCT 34.2* 33.1* 33.4* 31.0*  MCV 86.1 86.2 86.8 87.6  PLT 255 229 259 247   Cardiac Enzymes: No results for input(s): CKTOTAL, CKMB, CKMBINDEX, TROPONINI in the last  168 hours. BNP: Invalid input(s): POCBNP CBG: Recent Labs  Lab 10/17/17 1703 10/17/17 2130 10/18/17 0810 10/18/17 1213 10/18/17 1548  GLUCAP 186* 215* 249* 223* 178*   D-Dimer No results for input(s): DDIMER in the last 72 hours. Hgb A1c No results for input(s): HGBA1C in the last 72 hours. Lipid Profile No results for input(s): CHOL, HDL, LDLCALC, TRIG, CHOLHDL, LDLDIRECT in the last 72 hours. Thyroid function studies No results for input(s): TSH, T4TOTAL, T3FREE, THYROIDAB in the last 72 hours.  Invalid input(s): FREET3 Anemia work up No results for input(s): VITAMINB12, FOLATE, FERRITIN, TIBC, IRON, RETICCTPCT in the last 72 hours. Urinalysis    Component Value Date/Time   COLORURINE STRAW (A) 10/14/2017 2254   APPEARANCEUR CLEAR 10/14/2017 2254   LABSPEC 1.010 10/14/2017 2254   PHURINE 5.0 10/14/2017 2254   GLUCOSEU >=500 (A) 10/14/2017 2254   HGBUR SMALL (A) 10/14/2017 2254   BILIRUBINUR NEGATIVE 10/14/2017 2254   KETONESUR NEGATIVE 10/14/2017 2254   PROTEINUR NEGATIVE 10/14/2017 2254   UROBILINOGEN 1.0 05/04/2015 1415   NITRITE NEGATIVE 10/14/2017 2254   LEUKOCYTESUR NEGATIVE 10/14/2017 2254   Sepsis Labs Invalid input(s): PROCALCITONIN,  WBC,  LACTICIDVEN Microbiology Recent Results (from the past 240 hour(s))  Blood Culture (routine x 2)     Status: None (Preliminary result)   Collection Time: 10/14/17  7:01 PM  Result Value Ref Range Status   Specimen Description BLOOD BLOOD RIGHT WRIST  Final   Special Requests  Final    IN BOTH AEROBIC AND ANAEROBIC BOTTLES Blood Culture adequate volume   Culture   Final    NO GROWTH 4 DAYS Performed at [facility], [address], [place], [place] [address]    Report Status PENDING  Incomplete  Blood Culture (routine x 2)     Status: None (Preliminary result)   Collection Time: 10/14/17  7:29 PM  Result Value Ref Range Status   Specimen Description BLOOD RIGHT HAND  Final   Special Requests   Final     BOTTLES DRAWN AEROBIC AND ANAEROBIC Blood Culture adequate volume   Culture   Final    NO GROWTH 4 DAYS Performed at [facility], [address], [place], [place] [address]    Report Status PENDING  Incomplete  Respiratory Panel by PCR     Status: Abnormal   Collection Time: 10/14/17  8:14 PM  Result Value Ref Range Status   Adenovirus NOT DETECTED NOT DETECTED Final   Coronavirus 229E NOT DETECTED NOT DETECTED Final   Coronavirus HKU1 NOT DETECTED NOT DETECTED Final   Coronavirus NL63 NOT DETECTED NOT DETECTED Final   Coronavirus OC43 NOT DETECTED NOT DETECTED Final   Metapneumovirus NOT DETECTED NOT DETECTED Final   Rhinovirus / Enterovirus DETECTED (A) NOT DETECTED Final   Influenza A NOT DETECTED NOT DETECTED Final   Influenza B NOT DETECTED NOT DETECTED Final   Parainfluenza Virus 1 NOT DETECTED NOT DETECTED Final   Parainfluenza Virus 2 NOT DETECTED NOT DETECTED Final   Parainfluenza Virus 3 NOT DETECTED NOT DETECTED Final   Parainfluenza Virus 4 NOT DETECTED NOT DETECTED Final   Respiratory Syncytial Virus NOT DETECTED NOT DETECTED Final   Bordetella pertussis NOT DETECTED NOT DETECTED Final   Chlamydophila pneumoniae NOT DETECTED NOT DETECTED Final   Mycoplasma pneumoniae NOT DETECTED NOT DETECTED Final  Culture, sputum-assessment     Status: None   Collection Time: 10/15/17  8:45 AM  Result Value Ref Range Status   Specimen Description SPUTUM  Final   Special Requests NONE  Final   Sputum evaluation   Final    Sputum specimen not acceptable for testing.  Please recollect.   Gram Stain Report Called to,Read Back By and Verified With: [facility] POLING RN AT 1358 ON [id number] BY SJW Performed at [facility], [address] [place], [place] [address]    Report Status 10/15/2017 FINAL  Final  MRSA PCR Screening     Status: None   Collection Time: 10/15/17 10:16 AM  Result Value Ref Range Status   MRSA by PCR NEGATIVE NEGATIVE Final    Comment:        The  GeneXpert MRSA Assay (FDA approved for NASAL specimens only), is one component of a comprehensive MRSA colonization surveillance program. It is not intended to diagnose MRSA infection nor to guide or monitor treatment for MRSA infections. Performed at [facility], [address], [place], [place] [address]   Culture, expectorated sputum-assessment     Status: None   Collection Time: 10/15/17  9:47 PM  Result Value Ref Range Status   Specimen Description EXPECTORATED SPUTUM  Final   Special Requests NONE  Final   Sputum evaluation   Final    THIS SPECIMEN IS ACCEPTABLE FOR SPUTUM CULTURE Performed at [facility], [address], [place], [place] [address]    Report Status 10/16/2017 FINAL  Final  Culture, respiratory (NON-Expectorated)  Status: None   Collection Time: 10/15/17  9:47 PM  Result Value Ref Range Status   Specimen Description EXPECTORATED SPUTUM  Final   Special Requests NONE Reflexed from [id number]  Final   Gram Stain   Final    ABUNDANT WBC PRESENT, PREDOMINANTLY PMN RARE GRAM POSITIVE COCCI RARE YEAST    Culture   Final    Consistent with normal respiratory flora. Performed at [facility], [address], [place], [place] [address]    Report Status 10/18/2017 FINAL  Final     Time coordinating discharge: 35 mins  SIGNED:   Erick Blinks, MD  [facility] 10/18/2017, 5:47 PM Pager   If 7PM-7AM, please contact night-coverage [email or web address] Password [email or web address]

## 2017-10-18 NOTE — Care Management Important Message (Signed)
Important Message  Patient Details  Name: [name] MRN: [id number] Date of Birth: 01/01/1951   Medicare Important Message Given:  Yes    Leone Havenaylor, Denece Shearer [place], RN 10/18/2017, 2:37 PM

## 2017-10-19 LAB — CULTURE, BLOOD (ROUTINE X 2)
CULTURE: NO GROWTH
Culture: NO GROWTH
SPECIAL REQUESTS: ADEQUATE

## 2018-02-20 ENCOUNTER — Other Ambulatory Visit: Payer: Self-pay

## 2018-02-20 DIAGNOSIS — I739 Peripheral vascular disease, unspecified: Secondary | ICD-10-CM

## 2018-03-11 ENCOUNTER — Ambulatory Visit (HOSPITAL_COMMUNITY): Payer: Medicare Other

## 2018-03-11 ENCOUNTER — Encounter: Payer: Medicare Other | Admitting: Vascular Surgery

## 2018-05-06 ENCOUNTER — Encounter: Payer: Self-pay | Admitting: Vascular Surgery

## 2018-05-06 ENCOUNTER — Ambulatory Visit (HOSPITAL_COMMUNITY)
Admission: RE | Admit: 2018-05-06 | Discharge: 2018-05-06 | Disposition: A | Discharge status: Home / Self Care | Payer: Medicare Other | Source: Ambulatory Visit | Attending: Vascular Surgery | Admitting: Vascular Surgery

## 2018-05-06 ENCOUNTER — Ambulatory Visit (INDEPENDENT_AMBULATORY_CARE_PROVIDER_SITE_OTHER): Payer: Medicare Other | Admitting: Vascular Surgery

## 2018-05-06 DIAGNOSIS — I739 Peripheral vascular disease, unspecified: Secondary | ICD-10-CM

## 2018-05-06 DIAGNOSIS — M7989 Other specified soft tissue disorders: Secondary | ICD-10-CM | POA: Insufficient documentation

## 2018-05-06 NOTE — Progress Notes (Signed)
Patient name: [name] MRN: [id number] DOB: 10-Oct-1950 Sex: male  REASON FOR CONSULT: Evaluate circulation of both legs  HPI: [name] is a 67 y.o. male, with history severe lower extremity leg edema, hypertension, hyperlipidemia, CHF, and DM that presents for evaluation of circulation in his lower extremities from his PCP.  Patient reports numbness in his bilateral feet that has been chronic for years and states he has neuropathy.  He does have trouble with ambulation but does not endorse any classic claudication symptoms.  He has no evidence of distal tissue loss on the toes.  No rest pain at night other than foot numbness.  He has profound swelling and edema of both lower extremities including the foot and toes and states he once was told to get leaky valve in his right leg.  Swelling has been ongoing for years.  Hard for him to wear socks.  He has never had any revascularization procedures.  States he does have a history of heart failure but is unsure of his baseline cardiac function and hasnt seen a cardiologist in awhile.   Past Medical History:  Diagnosis Date  . 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 Diabetes mellitus 10/17/2011   newly dx today  . Edema leg    right leg has leaky valve and right foot swells  . Emphysema   . Excessive ear wax   . Fatty liver   . Hernia    near navel  . Hyperlipidemia   . Hypertension   . Leaky heart valve   . Neuropathy   . Rheumatic fever   . Slow urinary stream   . TIA (transient ischemic attack)     Past Surgical History:  Procedure Laterality Date  . CIRCUMCISION    . LITHOTRIPSY      Family History  Problem Relation Age of Onset  . Emphysema Mother   . Aneurysm Mother   . Emphysema Father   . Coronary artery disease Brother   . Coronary artery disease Brother   . Coronary artery disease Sister     SOCIAL HISTORY: Social History   Socioeconomic History  . Marital  status: Married    Spouse name: Not on file  . Number of children: Not on file  . Years of education: Not on file  . Highest education level: Not on file  Occupational History  . Not on file  Social Needs  . Financial resource strain: Not very hard  . Food insecurity:    Worry: Never true    Inability: Never true  . Transportation needs:    Medical: Patient refused    Non-medical: Patient refused  Tobacco Use  . Smoking status: Never Smoker  . Smokeless tobacco: Never Used  Substance and Sexual Activity  . Alcohol use: No  . Drug use: No  . Sexual activity: Not Currently    Partners: Female    Birth control/protection: None  Lifestyle  . Physical activity:    Days per week: 0 days    Minutes per session: 0 min  . Stress: Not at all  Relationships  . Social connections:    Talks on phone: Patient refused    Gets together: Patient refused    Attends religious service: Patient refused    Active member of club or organization: Patient refused    Attends meetings of clubs or organizations: Patient refused    Relationship status: Patient refused  . Intimate  partner violence:    Fear of current or ex partner: No    Emotionally abused: No    Physically abused: No    Forced sexual activity: No  Other Topics Concern  . Not on file  Social History Narrative   Patient lives in private residence with supportive spouse    Allergies  Allergen Reactions  . Cashew Nut Oil Palpitations  . Percocet [Oxycodone-Acetaminophen] Other (See Comments)    Hallucintaions    Current Outpatient Medications  Medication Sig Dispense Refill  . albuterol (PROVENTIL HFA;VENTOLIN HFA) 108 (90 Base) MCG/ACT inhaler Inhale 2 puffs into the lungs every 6 (six) hours as needed for wheezing or shortness of breath. 1 Inhaler 2  . aspirin EC 81 MG tablet Take 81 mg by mouth daily.    Marland Kitchen atorvastatin (LIPITOR) 20 MG tablet Take 20 mg by mouth every evening.     Marland Kitchen azithromycin (ZITHROMAX) 500 MG tablet  Take 1 tablet (500 mg total) by mouth daily. 3 tablet 0  . carvedilol (COREG) 6.25 MG tablet Take 1 tablet (6.25 mg total) by mouth 2 (two) times daily with a meal. 60 tablet 1  . cholecalciferol (VITAMIN D) 1000 units tablet Take 1,000 Units by mouth daily.    . finasteride (PROSCAR) 5 MG tablet Take 5 mg by mouth daily.    Marland Kitchen gabapentin (NEURONTIN) 300 MG capsule Take 600 mg by mouth 2 (two) times daily.     Marland Kitchen glipiZIDE (GLUCOTROL) 5 MG tablet Take 5 mg by mouth daily before breakfast.    . guaiFENesin (MUCINEX) 600 MG 12 hr tablet Take 1 tablet (600 mg total) by mouth 2 (two) times daily. 60 tablet 2  . hydrOXYzine (VISTARIL) 50 MG capsule Take 50 mg by mouth 3 (three) times daily.    . metFORMIN (GLUCOPHAGE-XR) 500 MG 24 hr tablet Take 500 mg by mouth 2 (two) times daily.    . metoCLOPramide (REGLAN) 5 MG tablet Take 1 tablet (5 mg total) by mouth every 8 (eight) hours as needed for nausea. 90 tablet 0  . montelukast (SINGULAIR) 10 MG tablet Take 10 mg by mouth at bedtime.    . Multiple Vitamin (MULTIVITAMIN) tablet Take 1 tablet by mouth daily.    . pantoprazole (PROTONIX) 40 MG tablet Take 1 tablet (40 mg total) by mouth daily. 30 tablet 1  . tamsulosin (FLOMAX) 0.4 MG CAPS capsule Take 0.4 mg by mouth daily.     No current facility-administered medications for this visit.     REVIEW OF SYSTEMS:  [X]  denotes positive finding, [ ]  denotes negative finding Cardiac  Comments:  Chest pain or chest pressure:    Shortness of breath upon exertion: x   Short of breath when lying flat:    Irregular heart rhythm:        Vascular    Pain in calf, thigh, or hip brought on by ambulation:    Pain in feet at night that wakes you up from your sleep:     Blood clot in your veins:    Leg swelling:  x       Pulmonary    Oxygen at home:    Productive cough:     Wheezing:         Neurologic    Sudden weakness in arms or legs:     Sudden numbness in arms or legs:     Sudden onset of difficulty  speaking or slurred speech:    Temporary loss of vision in  one eye:     Problems with dizziness:         Gastrointestinal    Blood in stool:     Vomited blood:         Genitourinary    Burning when urinating:     Blood in urine:        Psychiatric    Major depression:         Hematologic    Bleeding problems:    Problems with blood clotting too easily:        Skin    Rashes or ulcers:        Constitutional    Fever or chills:      PHYSICAL EXAM: Vitals:   05/06/18 1332  BP: (!) 159/71  Pulse: 78  Resp: 18  Temp: 97.7 F (36.5 C)  TempSrc: Oral  SpO2: 95%  Weight: 242 lb 14.4 oz (110.2 kg)  Height: 5' 6.5" (1.689 m)    GENERAL: The patient is a well-nourished male, in no acute distress. The vital signs are documented above. CARDIAC: There is a regular rate and rhythm.  VASCULAR:  2+ radial pulse palpable bilateral upper extremities 2+ femoral pulse palpable bilateral groins Hard to appreciate pedal pulses given extent of LE swelling but triphasic waveforms at ankles Profound LE edema of bilateral legs extending into feet and toes, skin thickening circumferential of bilateral calves with clear weeping fluid from right leg PULMONARY: There is good air exchange bilaterally without wheezing or rales. ABDOMEN: Soft and non-tender with normal pitched bowel sounds.  Obese. MUSCULOSKELETAL: There are no major deformities or cyanosis. NEUROLOGIC: No focal weakness or paresthesias are detected. SKIN: There are no ulcers or rashes noted. PSYCHIATRIC: The patient has a normal affect.  DATA:   I have independently reviewed his noninvasive imaging that shows triphasic waveforms at the bilateral ankles with adequate toe pressures of 157 on the right and 121 on the left.  His ABIs are largely noncompressible.  Assessment/Plan:  On evaluation patient is a 67 year old male with profound lower extremity edema of both lower extremities including the feet and toes.  After review  of his noninvasive imaging I do not think he has significant PAD to suggest arterial inflow disease as his primary problem.  Given his profound swelling on exam I suspect venous insufficiency or lymphedema is probably his primary problem.  I have asked that he get a lower extremity venous reflux study and return to see me afterwards to further evaluate the etiology.  If his venous reflux study is normal probably needs to be referred to lymphedema clinic and to ensure that his heart failure is optimally managed as well. Will make additional plans pending venous reflux study.    Cephus Shelling, MD [facility] Office: [phone] Pager: [phone]   Cephus Shelling

## 2018-06-01 ENCOUNTER — Encounter (HOSPITAL_COMMUNITY): Payer: Self-pay | Admitting: Emergency Medicine

## 2018-06-01 ENCOUNTER — Emergency Department (HOSPITAL_COMMUNITY): Payer: Medicare Other

## 2018-06-01 ENCOUNTER — Inpatient Hospital Stay (HOSPITAL_COMMUNITY)
Admission: EM | Admit: 2018-06-01 | Discharge: 2018-06-08 | DRG: 871 | Disposition: A | Discharge status: Home / Self Care | Payer: Medicare Other | Attending: Internal Medicine | Admitting: Internal Medicine

## 2018-06-01 DIAGNOSIS — J9690 Respiratory failure, unspecified, unspecified whether with hypoxia or hypercapnia: Secondary | ICD-10-CM | POA: Diagnosis present

## 2018-06-01 DIAGNOSIS — E1140 Type 2 diabetes mellitus with diabetic neuropathy, unspecified: Secondary | ICD-10-CM | POA: Diagnosis present

## 2018-06-01 DIAGNOSIS — J439 Emphysema, unspecified: Secondary | ICD-10-CM | POA: Diagnosis present

## 2018-06-01 DIAGNOSIS — G4733 Obstructive sleep apnea (adult) (pediatric): Secondary | ICD-10-CM | POA: Diagnosis present

## 2018-06-01 DIAGNOSIS — N179 Acute kidney failure, unspecified: Secondary | ICD-10-CM | POA: Diagnosis present

## 2018-06-01 DIAGNOSIS — Z79899 Other long term (current) drug therapy: Secondary | ICD-10-CM

## 2018-06-01 DIAGNOSIS — Z7984 Long term (current) use of oral hypoglycemic drugs: Secondary | ICD-10-CM

## 2018-06-01 DIAGNOSIS — G9340 Encephalopathy, unspecified: Secondary | ICD-10-CM | POA: Diagnosis not present

## 2018-06-01 DIAGNOSIS — I5043 Acute on chronic combined systolic (congestive) and diastolic (congestive) heart failure: Secondary | ICD-10-CM | POA: Diagnosis present

## 2018-06-01 DIAGNOSIS — Z6837 Body mass index (BMI) 37.0-37.9, adult: Secondary | ICD-10-CM | POA: Diagnosis not present

## 2018-06-01 DIAGNOSIS — Z4659 Encounter for fitting and adjustment of other gastrointestinal appliance and device: Secondary | ICD-10-CM

## 2018-06-01 DIAGNOSIS — Z8673 Personal history of transient ischemic attack (TIA), and cerebral infarction without residual deficits: Secondary | ICD-10-CM

## 2018-06-01 DIAGNOSIS — N183 Chronic kidney disease, stage 3 unspecified: Secondary | ICD-10-CM

## 2018-06-01 DIAGNOSIS — I340 Nonrheumatic mitral (valve) insufficiency: Secondary | ICD-10-CM | POA: Diagnosis not present

## 2018-06-01 DIAGNOSIS — R609 Edema, unspecified: Secondary | ICD-10-CM | POA: Diagnosis not present

## 2018-06-01 DIAGNOSIS — A419 Sepsis, unspecified organism: Principal | ICD-10-CM | POA: Diagnosis present

## 2018-06-01 DIAGNOSIS — J189 Pneumonia, unspecified organism: Secondary | ICD-10-CM | POA: Diagnosis present

## 2018-06-01 DIAGNOSIS — I272 Pulmonary hypertension, unspecified: Secondary | ICD-10-CM | POA: Diagnosis present

## 2018-06-01 DIAGNOSIS — Z885 Allergy status to narcotic agent status: Secondary | ICD-10-CM

## 2018-06-01 DIAGNOSIS — E119 Type 2 diabetes mellitus without complications: Secondary | ICD-10-CM

## 2018-06-01 DIAGNOSIS — J9601 Acute respiratory failure with hypoxia: Secondary | ICD-10-CM | POA: Diagnosis present

## 2018-06-01 DIAGNOSIS — I471 Supraventricular tachycardia: Secondary | ICD-10-CM | POA: Diagnosis present

## 2018-06-01 DIAGNOSIS — Z7722 Contact with and (suspected) exposure to environmental tobacco smoke (acute) (chronic): Secondary | ICD-10-CM | POA: Diagnosis present

## 2018-06-01 DIAGNOSIS — I248 Other forms of acute ischemic heart disease: Secondary | ICD-10-CM | POA: Diagnosis not present

## 2018-06-01 DIAGNOSIS — E876 Hypokalemia: Secondary | ICD-10-CM | POA: Diagnosis not present

## 2018-06-01 DIAGNOSIS — Z7982 Long term (current) use of aspirin: Secondary | ICD-10-CM

## 2018-06-01 DIAGNOSIS — E785 Hyperlipidemia, unspecified: Secondary | ICD-10-CM | POA: Diagnosis present

## 2018-06-01 DIAGNOSIS — R140 Abdominal distension (gaseous): Secondary | ICD-10-CM

## 2018-06-01 DIAGNOSIS — I10 Essential (primary) hypertension: Secondary | ICD-10-CM | POA: Diagnosis not present

## 2018-06-01 DIAGNOSIS — E1165 Type 2 diabetes mellitus with hyperglycemia: Secondary | ICD-10-CM | POA: Diagnosis not present

## 2018-06-01 DIAGNOSIS — J810 Acute pulmonary edema: Secondary | ICD-10-CM | POA: Diagnosis not present

## 2018-06-01 DIAGNOSIS — E1122 Type 2 diabetes mellitus with diabetic chronic kidney disease: Secondary | ICD-10-CM | POA: Diagnosis present

## 2018-06-01 DIAGNOSIS — Z452 Encounter for adjustment and management of vascular access device: Secondary | ICD-10-CM

## 2018-06-01 DIAGNOSIS — Z9911 Dependence on respirator [ventilator] status: Secondary | ICD-10-CM

## 2018-06-01 DIAGNOSIS — Z9289 Personal history of other medical treatment: Secondary | ICD-10-CM

## 2018-06-01 DIAGNOSIS — I5023 Acute on chronic systolic (congestive) heart failure: Secondary | ICD-10-CM | POA: Diagnosis not present

## 2018-06-01 DIAGNOSIS — I2510 Atherosclerotic heart disease of native coronary artery without angina pectoris: Secondary | ICD-10-CM | POA: Diagnosis present

## 2018-06-01 DIAGNOSIS — Z91018 Allergy to other foods: Secondary | ICD-10-CM

## 2018-06-01 DIAGNOSIS — R6521 Severe sepsis with septic shock: Secondary | ICD-10-CM | POA: Diagnosis not present

## 2018-06-01 DIAGNOSIS — I130 Hypertensive heart and chronic kidney disease with heart failure and stage 1 through stage 4 chronic kidney disease, or unspecified chronic kidney disease: Secondary | ICD-10-CM | POA: Diagnosis present

## 2018-06-01 DIAGNOSIS — J80 Acute respiratory distress syndrome: Secondary | ICD-10-CM | POA: Diagnosis not present

## 2018-06-01 LAB — I-STAT ARTERIAL BLOOD GAS, ED
Acid-base deficit: 5 mmol/L — ABNORMAL HIGH (ref 0.0–2.0)
Bicarbonate: 23.4 mmol/L (ref 20.0–28.0)
O2 Saturation: 54 %
PH ART: 7.239 — AB (ref 7.350–7.450)
TCO2: 25 mmol/L (ref 22–32)
pCO2 arterial: 54.9 mmHg — ABNORMAL HIGH (ref 32.0–48.0)
pO2, Arterial: 34 mmHg — CL (ref 83.0–108.0)

## 2018-06-01 LAB — URINALYSIS, ROUTINE W REFLEX MICROSCOPIC
Bilirubin Urine: NEGATIVE
Glucose, UA: 50 mg/dL — AB
Hgb urine dipstick: NEGATIVE
KETONES UR: NEGATIVE mg/dL
Leukocytes, UA: NEGATIVE
Nitrite: NEGATIVE
Protein, ur: 100 mg/dL — AB
Specific Gravity, Urine: 1.011 (ref 1.005–1.030)
pH: 5 (ref 5.0–8.0)

## 2018-06-01 LAB — BRAIN NATRIURETIC PEPTIDE: B NATRIURETIC PEPTIDE 5: 275.1 pg/mL — AB (ref 0.0–100.0)

## 2018-06-01 LAB — CBC WITH DIFFERENTIAL/PLATELET
Abs Immature Granulocytes: 0.08 10*3/uL — ABNORMAL HIGH (ref 0.00–0.07)
Basophils Absolute: 0.2 10*3/uL — ABNORMAL HIGH (ref 0.0–0.1)
Basophils Relative: 1 %
Eosinophils Absolute: 1 10*3/uL — ABNORMAL HIGH (ref 0.0–0.5)
Eosinophils Relative: 6 %
HCT: 40.8 % (ref 39.0–52.0)
Hemoglobin: 12.1 g/dL — ABNORMAL LOW (ref 13.0–17.0)
IMMATURE GRANULOCYTES: 1 %
LYMPHS ABS: 3.2 10*3/uL (ref 0.7–4.0)
Lymphocytes Relative: 20 %
MCH: 26.9 pg (ref 26.0–34.0)
MCHC: 29.7 g/dL — ABNORMAL LOW (ref 30.0–36.0)
MCV: 90.7 fL (ref 80.0–100.0)
MONOS PCT: 4 %
Monocytes Absolute: 0.7 10*3/uL (ref 0.1–1.0)
NEUTROS PCT: 68 %
Neutro Abs: 11.1 10*3/uL — ABNORMAL HIGH (ref 1.7–7.7)
PLATELETS: 409 10*3/uL — AB (ref 150–400)
RBC: 4.5 MIL/uL (ref 4.22–5.81)
RDW: 14.5 % (ref 11.5–15.5)
WBC: 16.1 10*3/uL — ABNORMAL HIGH (ref 4.0–10.5)
nRBC: 0 % (ref 0.0–0.2)

## 2018-06-01 LAB — STREP PNEUMONIAE URINARY ANTIGEN: Strep Pneumo Urinary Antigen: NEGATIVE

## 2018-06-01 LAB — BASIC METABOLIC PANEL
ANION GAP: 15 (ref 5–15)
BUN: 25 mg/dL — ABNORMAL HIGH (ref 8–23)
CHLORIDE: 99 mmol/L (ref 98–111)
CO2: 21 mmol/L — AB (ref 22–32)
Calcium: 9 mg/dL (ref 8.9–10.3)
Creatinine, Ser: 1.32 mg/dL — ABNORMAL HIGH (ref 0.61–1.24)
GFR calc Af Amer: >60 mL/min (ref >60)
GFR calc non Af Amer: 55 mL/min — ABNORMAL LOW (ref >60)
GLUCOSE: 316 mg/dL — AB (ref 70–99)
POTASSIUM: 4.5 mmol/L (ref 3.5–5.1)
Sodium: 135 mmol/L (ref 135–145)

## 2018-06-01 LAB — I-STAT CG4 LACTIC ACID, ED: LACTIC ACID, VENOUS: 5.24 mmol/L — AB (ref 0.5–1.9)

## 2018-06-01 LAB — I-STAT TROPONIN, ED: Troponin i, poc: 0 ng/mL (ref 0.00–0.08)

## 2018-06-01 MED ORDER — VANCOMYCIN HCL 10 G IV SOLR
2000.0000 mg | Freq: Once | INTRAVENOUS | Status: AC
  Administered 2018-06-02: 2000 mg via INTRAVENOUS
  Filled 2018-06-01: qty 2000

## 2018-06-01 MED ORDER — ALBUTEROL SULFATE (2.5 MG/3ML) 0.083% IN NEBU: 2.5000 mg | INHALATION_SOLUTION | RESPIRATORY_TRACT | Status: DC | PRN

## 2018-06-01 MED ORDER — PROPOFOL 1000 MG/100ML IV EMUL
INTRAVENOUS | Status: AC
  Administered 2018-06-01: 27 ug/kg/min via INTRAVENOUS
  Filled 2018-06-01: qty 100

## 2018-06-01 MED ORDER — FENTANYL CITRATE (PF) 100 MCG/2ML IJ SOLN
50.0000 ug | INTRAMUSCULAR | Status: AC | PRN
  Administered 2018-06-01 – 2018-06-02 (×3): 50 ug via INTRAVENOUS
  Filled 2018-06-01: qty 2

## 2018-06-01 MED ORDER — PIPERACILLIN-TAZOBACTAM 3.375 G IVPB 30 MIN
3.3750 g | Freq: Once | INTRAVENOUS | Status: AC
  Administered 2018-06-02: 3.375 g via INTRAVENOUS
  Filled 2018-06-01: qty 50

## 2018-06-01 MED ORDER — IOPAMIDOL (ISOVUE-370) INJECTION 76%
INTRAVENOUS | Status: AC
  Filled 2018-06-01: qty 100

## 2018-06-01 MED ORDER — SODIUM CHLORIDE 0.9 % IV SOLN
500.0000 mg | INTRAVENOUS | Status: AC
  Administered 2018-06-01 – 2018-06-05 (×5): 500 mg via INTRAVENOUS
  Filled 2018-06-01 (×5): qty 500

## 2018-06-01 MED ORDER — HEPARIN SODIUM (PORCINE) 5000 UNIT/ML IJ SOLN
5000.0000 [IU] | Freq: Three times a day (TID) | INTRAMUSCULAR | Status: DC
  Administered 2018-06-02 – 2018-06-08 (×19): 5000 [IU] via SUBCUTANEOUS
  Filled 2018-06-01 (×18): qty 1

## 2018-06-01 MED ORDER — VANCOMYCIN HCL 10 G IV SOLR
1250.0000 mg | INTRAVENOUS | Status: DC
  Administered 2018-06-02: 1250 mg via INTRAVENOUS
  Filled 2018-06-01: qty 1250

## 2018-06-01 MED ORDER — SODIUM CHLORIDE 0.9 % IV SOLN
2.0000 g | INTRAVENOUS | Status: DC
  Administered 2018-06-01: 2 g via INTRAVENOUS
  Filled 2018-06-01: qty 20

## 2018-06-01 MED ORDER — FENTANYL 2500MCG IN NS 250ML (10MCG/ML) PREMIX INFUSION
25.0000 ug/h | INTRAVENOUS | Status: DC
  Administered 2018-06-01: 50 ug/h via INTRAVENOUS
  Administered 2018-06-02: 300 ug/h via INTRAVENOUS
  Administered 2018-06-03: 50 ug/h via INTRAVENOUS
  Administered 2018-06-04: 150 ug/h via INTRAVENOUS
  Filled 2018-06-01 (×4): qty 250

## 2018-06-01 MED ORDER — MIDAZOLAM HCL 2 MG/2ML IJ SOLN
1.0000 mg | INTRAMUSCULAR | Status: DC | PRN
  Administered 2018-06-02: 1 mg via INTRAVENOUS
  Filled 2018-06-01: qty 2

## 2018-06-01 MED ORDER — PROPOFOL 1000 MG/100ML IV EMUL
0.0000 ug/kg/min | INTRAVENOUS | Status: DC
  Administered 2018-06-01: 27 ug/kg/min via INTRAVENOUS
  Administered 2018-06-02: 50 ug/kg/min via INTRAVENOUS
  Administered 2018-06-02: 15 ug/kg/min via INTRAVENOUS
  Administered 2018-06-02: 50 ug/kg/min via INTRAVENOUS
  Administered 2018-06-02: 15 ug/kg/min via INTRAVENOUS
  Administered 2018-06-02: 50 ug/kg/min via INTRAVENOUS
  Filled 2018-06-01 (×4): qty 100

## 2018-06-01 MED ORDER — SODIUM CHLORIDE 0.9 % IV SOLN
INTRAVENOUS | Status: DC
  Administered 2018-06-02 – 2018-06-03 (×3): via INTRAVENOUS

## 2018-06-01 MED ORDER — PIPERACILLIN-TAZOBACTAM 3.375 G IVPB
3.3750 g | Freq: Three times a day (TID) | INTRAVENOUS | Status: DC
  Administered 2018-06-02 – 2018-06-04 (×7): 3.375 g via INTRAVENOUS
  Filled 2018-06-01 (×8): qty 50

## 2018-06-01 MED ORDER — INSULIN ASPART 100 UNIT/ML ~~LOC~~ SOLN
0.0000 [IU] | SUBCUTANEOUS | Status: DC
  Administered 2018-06-02: 3 [IU] via SUBCUTANEOUS
  Administered 2018-06-02: 5 [IU] via SUBCUTANEOUS
  Administered 2018-06-02: 2 [IU] via SUBCUTANEOUS
  Administered 2018-06-02: 3 [IU] via SUBCUTANEOUS
  Administered 2018-06-02: 2 [IU] via SUBCUTANEOUS
  Administered 2018-06-03 (×3): 3 [IU] via SUBCUTANEOUS
  Administered 2018-06-03 (×2): 5 [IU] via SUBCUTANEOUS
  Administered 2018-06-04 (×3): 3 [IU] via SUBCUTANEOUS
  Administered 2018-06-04: 5 [IU] via SUBCUTANEOUS
  Administered 2018-06-04: 3 [IU] via SUBCUTANEOUS
  Administered 2018-06-04 – 2018-06-05 (×3): 2 [IU] via SUBCUTANEOUS

## 2018-06-01 MED ORDER — IPRATROPIUM-ALBUTEROL 0.5-2.5 (3) MG/3ML IN SOLN
3.0000 mL | Freq: Four times a day (QID) | RESPIRATORY_TRACT | Status: DC
  Administered 2018-06-02 – 2018-06-05 (×14): 3 mL via RESPIRATORY_TRACT
  Filled 2018-06-01 (×14): qty 3

## 2018-06-01 MED ORDER — ASPIRIN EC 81 MG PO TBEC: 81.0000 mg | DELAYED_RELEASE_TABLET | Freq: Every day | ORAL | Status: DC

## 2018-06-01 MED ORDER — SODIUM CHLORIDE 0.9 % IV SOLN
INTRAVENOUS | Status: DC | PRN
  Administered 2018-06-06 – 2018-06-08 (×3): via INTRA_ARTERIAL

## 2018-06-01 MED ORDER — PHENYLEPHRINE HCL-NACL 10-0.9 MG/250ML-% IV SOLN
0.0000 ug/min | INTRAVENOUS | Status: DC
  Administered 2018-06-02: 20 ug/min via INTRAVENOUS
  Filled 2018-06-01: qty 250

## 2018-06-01 MED ORDER — PANTOPRAZOLE SODIUM 40 MG IV SOLR
40.0000 mg | Freq: Every day | INTRAVENOUS | Status: DC
  Administered 2018-06-02 (×2): 40 mg via INTRAVENOUS
  Filled 2018-06-01 (×3): qty 40

## 2018-06-01 MED ORDER — SODIUM CHLORIDE 0.9 % IV BOLUS
1000.0000 mL | Freq: Once | INTRAVENOUS | Status: AC
  Administered 2018-06-01: 1000 mL via INTRAVENOUS

## 2018-06-01 MED ORDER — FENTANYL CITRATE (PF) 100 MCG/2ML IJ SOLN
50.0000 ug | INTRAMUSCULAR | Status: DC | PRN
  Administered 2018-06-02 – 2018-06-03 (×2): 50 ug via INTRAVENOUS

## 2018-06-01 MED ORDER — MIDAZOLAM HCL 2 MG/2ML IJ SOLN
1.0000 mg | INTRAMUSCULAR | Status: DC | PRN
  Administered 2018-06-01 (×2): 1 mg via INTRAVENOUS
  Filled 2018-06-01 (×2): qty 2

## 2018-06-01 MED ORDER — SUCCINYLCHOLINE CHLORIDE 20 MG/ML IJ SOLN
INTRAMUSCULAR | Status: AC | PRN
  Administered 2018-06-01: 100 mg via INTRAVENOUS

## 2018-06-01 MED ORDER — ATORVASTATIN CALCIUM 20 MG PO TABS
20.0000 mg | ORAL_TABLET | Freq: Every evening | ORAL | Status: DC
  Administered 2018-06-02 – 2018-06-04 (×3): 20 mg via ORAL
  Filled 2018-06-01 (×3): qty 1

## 2018-06-01 MED ORDER — ETOMIDATE 2 MG/ML IV SOLN
INTRAVENOUS | Status: AC | PRN
  Administered 2018-06-01: 20 mg via INTRAVENOUS

## 2018-06-01 MED ORDER — FUROSEMIDE 10 MG/ML IJ SOLN
60.0000 mg | Freq: Once | INTRAMUSCULAR | Status: AC
Start: 2018-06-01 — End: 2018-06-01
  Administered 2018-06-01: 60 mg via INTRAVENOUS
  Filled 2018-06-01: qty 6

## 2018-06-01 NOTE — Progress Notes (Signed)
   06/01/18 2100  Clinical Encounter Type  Visited With Family;Patient not available  Visit Type Initial;ED;Critical Care  Referral From Nurse  Spiritual Encounters  Spiritual Needs Emotional  [facility] responded to page to support family; [facility] with MD to speak with spouse of patient on phone; step-daughter leaving hospital and wife of patient will be coming shortly; [facility]; no additional support needed at present.

## 2018-06-01 NOTE — Progress Notes (Signed)
Pharmacy Antibiotic Note  Harold ManisRodney Waller is a 67 y.o. male admitted on 06/01/2018 with sepsis.  Pharmacy has been consulted for vancomycin and zosyn dosing.  Plan: Zosyn 3.375g IV q8h (4 hour infusion).  Vancomycin 2gm IV x 1 then 1250 mg IV q24 hours F/u renal function, cultures and clinical course  Height: 5\' 7"  (170.2 cm) Weight: 242 lb 15.2 oz (110.2 kg) IBW/kg (Calculated) : 66.1  Temp (24hrs), Avg:97.6 F (36.4 C), Min:96.6 F (35.9 C), Max:98.6 F (37 C)  Recent Labs  Lab 06/01/18 2100 06/01/18 2107  WBC 16.1*  --   CREATININE 1.32*  --   LATICACIDVEN  --  5.24*    Estimated Creatinine Clearance: 63.4 mL/min (A) (by C-G formula based on SCr of 1.32 mg/dL (H)).    Allergies  Allergen Reactions  . Cashew Nut Oil Palpitations  . Percocet [Oxycodone-Acetaminophen] Other (See Comments)    Hallucintaions     Thank you for allowing pharmacy to be a part of this patient's care.  Talbert CageSeay, Kynlie Jane Poteet 06/01/2018 11:14 PM

## 2018-06-01 NOTE — ED Notes (Signed)
Diprivan decreased d/t BP, IV fentanyl started.

## 2018-06-01 NOTE — Progress Notes (Signed)
MD at bedside decreased VT to 550.  Draw ABG when  Patient is transferred.

## 2018-06-01 NOTE — ED Notes (Signed)
Paged chaplain per RN  

## 2018-06-01 NOTE — ED Notes (Signed)
[facility] member. Per EMS pt told family he felt SOB, went outside and went unresponsive, EMS arrived and pt was apneic. Began bagging pt and was given 2mg  Narcan. Shortly before arriving to ED pt became responsive, GCS 15. Pt is tachypneic and tachycardic upon arrival with O2 sats mid 60's on NRB. Pt was placed onto bipap with no improvement. EDP and RT at bedside preparing to intubate.

## 2018-06-01 NOTE — ED Provider Notes (Signed)
[facility] EMERGENCY DEPARTMENT Provider Note   CSN: [id number] Arrival date & time: 06/01/18  2053     History   Chief Complaint Chief Complaint  Patient presents with  . Shortness of Breath    HPI [name] is a 67 y.o. male.  Pt presents to the ED as an unresponsive person.  The pt was at [facility] visiting a family member.  The pt told his family that he was sob and went outside.  He went unresponsive shortly afterwards.  EMS was called and he was apneic.  They bagged pt and also gave him narcan.  He did become responsive shortly before arrival to the ED.  The pt is extremely sob and is unable to give any hx.     Past Medical History:  Diagnosis Date  . 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 Diabetes mellitus 10/17/2011   newly dx today  . Edema leg    right leg has leaky valve and right foot swells  . Emphysema   . Excessive ear wax   . Fatty liver   . Hernia    near navel  . Hyperlipidemia   . Hypertension   . Leaky heart valve   . Neuropathy   . Rheumatic fever   . Slow urinary stream   . TIA (transient ischemic attack)     Patient Active Problem List   Diagnosis Date Noted  . Leg swelling 05/06/2018  . Acute respiratory failure with hypoxia (HCC) 10/14/2017  . Influenza-like illness 10/14/2017  . Delirium 10/14/2017  . Acute urinary retention 10/14/2017  . Community acquired pneumonia   . (HFpEF) heart failure with preserved ejection fraction (HCC), pulmonary edema and LBBB of unknown chronicity  10/29/2016  . Elevated troponin I level, presume demand ischemia  10/29/2016  . AKI (acute kidney injury) (HCC) 10/29/2016  . BPH (benign prostatic hyperplasia) 10/29/2016  . Sepsis (HCC) 10/29/2016  . Bacteremia due to Escherichia coli 10/29/2016  . Fatty liver 10/29/2016  . Acute pulmonary edema (HCC)   . Acute hypoxic and hypercarbic respiratory failure (HCC) in setting of bilateral pulmonary  infiltrates. Presume CAP + pulmonary edema +/-ALI 10/27/2016  . Left-sided weakness 05/04/2015  . TIA (transient ischemic attack) : Rule out. 05/04/2015  . Hyperlipidemia 05/04/2015  . Chest pain 10/10/2013  . SOB (shortness of breath) 10/10/2013  . Hyperglycemia without ketosis 10/17/2011  . Diabetes mellitus (HCC) 10/17/2011  . HTN (hypertension) 10/17/2011  . Foot drop, left 10/17/2011  . Impacted cerumen of right ear 10/17/2011  . Dyspnea 10/17/2011  . History of TIA (transient ischemic attack) 10/17/2011  . Neuropathy (HCC) 10/17/2011  . Aortic insufficiency 03/16/2011  . Chest pain 03/16/2011    Past Surgical History:  Procedure Laterality Date  . CIRCUMCISION    . LITHOTRIPSY          Home Medications    Prior to Admission medications   Medication Sig Start Date End Date Taking? Authorizing Provider  albuterol (PROVENTIL HFA;VENTOLIN HFA) 108 (90 Base) MCG/ACT inhaler Inhale 2 puffs into the lungs every 6 (six) hours as needed for wheezing or shortness of breath. 10/18/17   Erick Blinks, MD  aspirin EC 81 MG tablet Take 81 mg by mouth daily.    [provider]  atorvastatin (LIPITOR) 20 MG tablet Take 20 mg by mouth every evening.     [provider]  azithromycin (ZITHROMAX) 500 MG tablet Take 1 tablet (500  mg total) by mouth daily. 10/18/17   Erick Blinks, MD  carvedilol (COREG) 6.25 MG tablet Take 1 tablet (6.25 mg total) by mouth 2 (two) times daily with a meal. 11/02/16   Bevelyn Ngo, NP  cholecalciferol (VITAMIN D) 1000 units tablet Take 1,000 Units by mouth daily.    [provider]  finasteride (PROSCAR) 5 MG tablet Take 5 mg by mouth daily.    [provider]  gabapentin (NEURONTIN) 300 MG capsule Take 600 mg by mouth 2 (two) times daily.     [provider]  glipiZIDE (GLUCOTROL) 5 MG tablet Take 5 mg by mouth daily before breakfast.    [provider]  guaiFENesin (MUCINEX) 600 MG 12 hr tablet Take 1  tablet (600 mg total) by mouth 2 (two) times daily. 10/18/17 10/18/18  Erick Blinks, MD  hydrOXYzine (VISTARIL) 50 MG capsule Take 50 mg by mouth 3 (three) times daily.    [provider]  metFORMIN (GLUCOPHAGE-XR) 500 MG 24 hr tablet Take 500 mg by mouth 2 (two) times daily.    [provider]  metoCLOPramide (REGLAN) 5 MG tablet Take 1 tablet (5 mg total) by mouth every 8 (eight) hours as needed for nausea. 10/18/17   Erick Blinks, MD  montelukast (SINGULAIR) 10 MG tablet Take 10 mg by mouth at bedtime.    [provider]  Multiple Vitamin (MULTIVITAMIN) tablet Take 1 tablet by mouth daily.    [provider]  pantoprazole (PROTONIX) 40 MG tablet Take 1 tablet (40 mg total) by mouth daily. 10/11/13   Philip Aspen, Limmie Patricia, MD  tamsulosin (FLOMAX) 0.4 MG CAPS capsule Take 0.4 mg by mouth daily. 05/03/15   [provider]    Family History Family History  Problem Relation Age of Onset  . Emphysema Mother   . Aneurysm Mother   . Emphysema Father   . Coronary artery disease Brother   . Coronary artery disease Brother   . Coronary artery disease Sister     Social History Social History   Tobacco Use  . Smoking status: Never Smoker  . Smokeless tobacco: Never Used  Substance Use Topics  . Alcohol use: No  . Drug use: No     Allergies   Cashew nut oil and Percocet [oxycodone-acetaminophen]   Review of Systems Review of Systems  Unable to perform ROS: Severe respiratory distress  Respiratory: Positive for shortness of breath.   All other systems reviewed and are negative.    Physical Exam Updated Vital Signs BP 93/62   Pulse (!) 107   Temp 97.9 F (36.6 C)   Resp 20   Ht 5\' 7"  (1.702 m)   Wt 110.2 kg   SpO2 97%   BMI 38.05 kg/m   Physical Exam  Constitutional: He is oriented to person, place, and time. He appears toxic. He appears ill. He appears distressed.  HENT:  Head: Normocephalic and atraumatic.    Mouth/Throat: Oropharynx is clear and moist.  Eyes: Pupils are equal, round, and reactive to light. EOM are normal.  Neck: Normal range of motion. Neck supple.  Cardiovascular: Regular rhythm. Tachycardia present.  Pulmonary/Chest: Accessory muscle usage present. Tachypnea noted. He is in respiratory distress. He has rhonchi. He has rales.  Abdominal: Soft. Bowel sounds are normal.  Musculoskeletal: Normal range of motion.       Right lower leg: He exhibits edema.       Left lower leg: He exhibits edema.  Neurological: He is alert  and oriented to person, place, and time.  Skin: Skin is warm. Capillary refill takes less than 2 seconds.  Psychiatric: His mood appears anxious.  Nursing note and vitals reviewed.    ED Treatments / Results  Labs (all labs ordered are listed, but only abnormal results are displayed) Labs Reviewed  BASIC METABOLIC PANEL - Abnormal; Notable for the following components:      Result Value   CO2 21 (*)    Glucose, Bld 316 (*)    BUN 25 (*)    Creatinine, Ser 1.32 (*)    GFR calc non Af Amer 55 (*)    All other components within normal limits  CBC WITH DIFFERENTIAL/PLATELET - Abnormal; Notable for the following components:   WBC 16.1 (*)    Hemoglobin 12.1 (*)    MCHC 29.7 (*)    Platelets 409 (*)    Neutro Abs 11.1 (*)    Eosinophils Absolute 1.0 (*)    Basophils Absolute 0.2 (*)    Abs Immature Granulocytes 0.08 (*)    All other components within normal limits  BRAIN NATRIURETIC PEPTIDE - Abnormal; Notable for the following components:   B Natriuretic Peptide 275.1 (*)    All other components within normal limits  I-STAT CG4 LACTIC ACID, ED - Abnormal; Notable for the following components:   Lactic Acid, Venous 5.24 (*)    All other components within normal limits  I-STAT ARTERIAL BLOOD GAS, ED - Abnormal; Notable for the following components:   pH, Arterial 7.239 (*)    pCO2 arterial 54.9 (*)    pO2, Arterial 34.0 (*)    Acid-base deficit  5.0 (*)    All other components within normal limits  CULTURE, BLOOD (ROUTINE X 2)  CULTURE, BLOOD (ROUTINE X 2)  TRIGLYCERIDES  URINALYSIS, ROUTINE W REFLEX MICROSCOPIC  I-STAT TROPONIN, ED    EKG EKG Interpretation  Date/Time:  Sunday June 01 2018 20:53:45 EST Ventricular Rate:  126 PR Interval:    QRS Duration: 157 QT Interval:  394 QTC Calculation: 571 R Axis:   -4 Text Interpretation:  Junctional tachycardia Left bundle branch block Since last tracing rate faster Confirmed by Jacalyn Lefevre [id number]) on 06/01/2018 8:55:37 PM   Radiology Dg Chest Portable 1 View  Result Date: 06/01/2018 CLINICAL DATA:  Intubation EXAM: PORTABLE CHEST 1 VIEW COMPARISON:  06/01/2018 FINDINGS: Endotracheal tube is 3 cm above the carina. NG tube tip is in the proximal stomach with the side port near the GE junction. Bilateral severe airspace disease again noted, stable. Heart is borderline in size. No visible effusions. IMPRESSION: Diffuse bilateral airspace disease again noted, unchanged. NG tube tip in the proximal stomach with the side port near the GE junction. Electronically Signed   By: Charlett Nose M.D.   On: 06/01/2018 21:42   Dg Chest Portable 1 View  Result Date: 06/01/2018 CLINICAL DATA:  Post intubation. EXAM: PORTABLE CHEST 1 VIEW COMPARISON:  06/01/2018 and 10/14/2017 FINDINGS: Endotracheal tube has tip 3.4 cm above the carina. Lungs are adequately inflated and demonstrate moderate bilateral airspace consolidation throughout the lungs right worse than left slightly worse. No effusion. Stable cardiomegaly. Remainder of the exam is unchanged. IMPRESSION: Moderate bilateral airspace consolidation with slight interval worsening. Stable cardiomegaly. Endotracheal tube with tip 3.4 cm above the carina. Electronically Signed   By: Elberta Fortis M.D.   On: 06/01/2018 21:36   Dg Chest Port 1 View  Result Date: 06/01/2018 CLINICAL DATA:  Severe lower extremity edema. EXAM: PORTABLE CHEST  1 VIEW  COMPARISON:  October 14, 2017 FINDINGS: Cardiomegaly again identified. Bilateral pulmonary infiltrates, right greater than left. The infiltrate is more focal on the right than the left as well. No pneumothorax. No other interval changes. IMPRESSION: 1. Cardiomegaly. 2. Bilateral pulmonary infiltrates, right greater than left may represent asymmetric edema. The more focal opacity on the right could represent more focal edema versus an underlying infiltrate such as pneumonia. Recommend clinical correlation and follow-up to resolution. Electronically Signed   By: Gerome Sam III M.D   On: 06/01/2018 21:10    Procedures Procedure Name: Intubation Date/Time: 06/01/2018 9:55 PM Performed by: Jacalyn Lefevre, MD Pre-anesthesia Checklist: Suction available, Patient identified, Patient being monitored, Timeout performed and Emergency Drugs available Oxygen Delivery Method: Ambu bag Preoxygenation: Pre-oxygenation with 100% oxygen Induction Type: Rapid sequence Ventilation: Mask ventilation with difficulty Laryngoscope Size: Glidescope and 3 Grade View: Grade II Tube size: 8.0 mm Number of attempts: 3 Secured at: 23 cm Tube secured with: ETT holder Dental Injury: Teeth and Oropharynx as per pre-operative assessment  Difficulty Due To: Difficulty was anticipated, Difficult Airway- due to limited oral opening and Difficult Airway- due to reduced neck mobility Future Recommendations: Recommend- induction with short-acting agent, and alternative techniques readily available Comments: Pt initially intubated with 7.5 ETT.  Tube was visualized going through the cords with good EtCO2 detection change.  There seemed to be a large air leak, so I was worried that the cuff had torn on pt's jagged teeth.  I removed that tube and replaced it with another 7.5 ETT.  Good color change again, but still an air leak.  So then, I used an 8.0 ETT which resolved the problem.  Pt placed on the ventilator and 12 of  PEEP was  applied which helped support his lungs and increased his oxygenation.      (including critical care time)  Medications Ordered in ED Medications  fentaNYL (SUBLIMAZE) injection 50 mcg (50 mcg Intravenous Given 06/01/18 2131)  fentaNYL (SUBLIMAZE) injection 50 mcg (has no administration in time range)  propofol (DIPRIVAN) 1000 MG/100ML infusion (14.973 mcg/kg/min  110.2 kg Intravenous Rate/Dose Change 06/01/18 2201)  midazolam (VERSED) injection 1 mg (1 mg Intravenous Given 06/01/18 2213)  midazolam (VERSED) injection 1 mg (has no administration in time range)  cefTRIAXone (ROCEPHIN) 2 g in sodium chloride 0.9 % 100 mL IVPB (2 g Intravenous New Bag/Given 06/01/18 2141)  azithromycin (ZITHROMAX) 500 mg in sodium chloride 0.9 % 250 mL IVPB (has no administration in time range)  fentaNYL in NS (59mcg/ml) infusion-PREMIX (250 mcg/hr Intravenous Rate/Dose Change 06/01/18 2216)  furosemide (LASIX) injection 60 mg (60 mg Intravenous Given 06/01/18 2138)  propofol (DIPRIVAN) 1000 MG/100ML infusion (  Stopped 06/01/18 2141)     Initial Impression / Assessment and Plan / ED Course  I have reviewed the triage vital signs and the nursing notes.  Pertinent labs & imaging results that were available during my care of the patient were reviewed by me and considered in my medical decision making (see chart for details).    Pt was initially started on bipap, but he was having such a hard time breathing, we decided to intubate.  See intubation note.  I initially thought pt was fluid overloaded as he has a hx of CHF, so I gave him lasix.  CXR shows possible pneumonia bilaterally.  Pt given sepsis abx.  I held off on fluids as he does have CHF.  Pt was d/w Dr. Warrick Parisian Pola Corn) who will  send someone down to admit.  He requested CT angio which I ordered.  CRITICAL CARE Performed by: Jacalyn LefevreJulie Ajahnae Rathgeber   Total critical care time: 60 minutes  Critical care time was exclusive of separately billable  procedures and treating other patients.  Critical care was necessary to treat or prevent imminent or life-threatening deterioration.  Critical care was time spent personally by me on the following activities: development of treatment plan with patient and/or surrogate as well as nursing, discussions with consultants, evaluation of patient's response to treatment, examination of patient, obtaining history from patient or surrogate, ordering and performing treatments and interventions, ordering and review of laboratory studies, ordering and review of radiographic studies, pulse oximetry and re-evaluation of patient's condition.       Final Clinical Impressions(s) / ED Diagnoses   Final diagnoses:  Acute respiratory failure with hypoxia (HCC)  Community acquired pneumonia, unspecified laterality  [name] (chronic kidney disease) stage 3, GFR 30-59 ml/min [facility](HCC)    ED Discharge Orders    None       Jacalyn LefevreHaviland, Yutaka Holberg, MD 06/01/18 2217

## 2018-06-01 NOTE — H&P (Addendum)
NAME:  [name], MRN:  [id number], DOB:  07/01/1951, LOS: 0 ADMISSION DATE:  06/01/2018, CONSULTATION DATE:  06/01/18 REFERRING MD:  ed, CHIEF COMPLAINT:  SOB  Brief History   67 yo admitted with resp arrest , intubated emergently in the ED upon arrival.   History of present illness   67 yo male with hx of HTN, morbid obesity , diabetes presenting to the ED in extremis , arrived on a NRB mask , intubated emergently and difficult to oxygenate and ventilate. Per the wife the patient had not been complaining of any SOB recently , fevers or chills and says he says in his usual state of health walking around. Is is unclear what happened as she was not with him when the resp arrest happened. When EMS arrived he was apneic. Unclear if he vomited or aspirated. He has been intubated before for unknown reasons.   Past Medical History  HTN, OSA , DM , obesity  Significant Hospital Events   Intubated in the ED Received lasix on arrival and now receiving some IVF boluses.  Very hard to oxygenate , sedation has been increased and ventilator at PEEP of 12 and FIO2 100 Repeat ABG pending  Consults:  CCM  Procedures:  Intubation 12/1 Might require TLC   Significant Diagnostic Tests:  CXR - severe multifocal pneumonia, ETT in place , OGT in the prox stomach  Micro Data:  pending  Antimicrobials:  Zosyn, vanco, azithro day 0   Objective   Blood pressure 100/73, pulse 97, temperature (!) 97.2 F (36.2 C), resp. rate 18, height 5\' 7"  (1.702 m), weight 110.2 kg, SpO2 92 %.    Vent Mode: PRVC FiO2 (%):  [100 %] 100 % Set Rate:  [12 bmp-20 bmp] 20 bmp PEEP:  [12 cmH20] 12 cmH20 Plateau Pressure:  [18 cmH20] 18 cmH20  No intake or output data in the 24 hours ending 06/01/18 2317 Filed Weights   06/01/18 2054  Weight: 110.2 kg    Examination: General: The patient is awake, alert and nodding appropriately on the ventilator.  HEENT: The scalp is atraumatic. PERRL. EOMI. Conjunctivae and  sclerae are normal..  Neck: Supple. No submandibular/cervical LA. Trachea midline, No thyromegaly.  Lungs: diffuse ronchi b/l , decrease bs , no wheezing or crackles Cardiovascular: Normal S1/S2, no murmurs or rubs.  Abdomen: distended , obese , decrease BS but present Extremities: chronic venous stasis b/l in the lower ext , trace pedal edema Skin: dry , capillary refill 2 seconds MSK: Strength 5/5 and symmetric x 4.  Neuro: CN II-XII grossly intact. Follows commands and moves all extremities Psychiatric: Oriented x 3.  Assessment & Plan:  Acute resp failure with hypoxemia secondary to severe multilobar community acquired pneumonia CXR is pretty impressive with b/l infiltrates worst on the R, wonder if he aspirated Will cover with zosyn/vanco and azithro , cultures pending , legionella/strep, flu and viral panel Full vent support - on PRVC 550/20/100/12 , will repeat ABG soon and adjust as needed. Hopefully he will settle down with sedation and will not need paralytics.  Propofol and fentanyl for RAAS -4 for now OGT to IS - belly is distended but soft  Hx of emphysema - no signs of bronchospasm at this point, cont nebs , hold steroids for now  Sepsis - secondary to above , BP is stable no signs of shock . Lactic of 6 , will repeat after 2 L of crystalloids  Diabetes - FS goal < 180 , on NISS ,  might require some Lantus , will follow  Htn - hold meds   Hx of CAD and chronic systolic CHF with an EF of 45% in 2018 - check EKG and trops , cont aspirin, check TTE  Hx of OSA  Expect prolonged hospital course given severity of pneumonia and body habitus   Best practice:  Diet: nutrition c/s for enteric feeds Pain/Anxiety/Delirium protocol (if indicated): Propofol/Fentanyl , aim for RAAS -4 for now VAP protocol (if indicated): yes DVT prophylaxis: HSQ GI prophylaxis: PPI Glucose control: NISS Mobility: bedrest Code Status: FULL Family Communication: wife updated at  bedside Disposition: ICU , critically ill  Labs   CBC: Recent Labs  Lab 06/01/18 2100  WBC 16.1*  NEUTROABS 11.1*  HGB 12.1*  HCT 40.8  MCV 90.7  PLT 409*    Basic Metabolic Panel: Recent Labs  Lab 06/01/18 2100  NA 135  K 4.5  CL 99  CO2 21*  GLUCOSE 316*  BUN 25*  CREATININE 1.32*  CALCIUM 9.0   GFR: Estimated Creatinine Clearance: 63.4 mL/min (A) (by C-G formula based on SCr of 1.32 mg/dL (H)). Recent Labs  Lab 06/01/18 2100 06/01/18 2107  WBC 16.1*  --   LATICACIDVEN  --  5.24*    Liver Function Tests: No results for input(s): AST, ALT, ALKPHOS, BILITOT, PROT, ALBUMIN in the last 168 hours. No results for input(s): LIPASE, AMYLASE in the last 168 hours. No results for input(s): AMMONIA in the last 168 hours.  ABG    Component Value Date/Time   PHART 7.239 (L) 06/01/2018 2102   PCO2ART 54.9 (H) 06/01/2018 2102   PO2ART 34.0 (LL) 06/01/2018 2102   HCO3 23.4 06/01/2018 2102   TCO2 25 06/01/2018 2102   ACIDBASEDEF 5.0 (H) 06/01/2018 2102   O2SAT 54.0 06/01/2018 2102     Coagulation Profile: No results for input(s): INR, PROTIME in the last 168 hours.  Cardiac Enzymes: No results for input(s): CKTOTAL, CKMB, CKMBINDEX, TROPONINI in the last 168 hours.  HbA1C: Hgb A1c MFr Bld  Date/Time Value Ref Range Status  10/15/2017 01:15 PM 10.6 (H) 4.8 - 5.6 % Final    Comment:    (NOTE) Pre diabetes:          5.7%-6.4% Diabetes:              >6.4% Glycemic control for   <7.0% adults with diabetes   10/28/2016 07:25 AM 10.1 (H) 4.8 - 5.6 % Final    Comment:    (NOTE)         Pre-diabetes: 5.7 - 6.4         Diabetes: >6.4         Glycemic control for adults with diabetes: <7.0     CBG: No results for input(s): GLUCAP in the last 168 hours.  Review of Systems:   12 point ROS done and negative , pertinent positives are mentioned in the HPI  Past Medical History  He,  has a past medical history of Blockage of coronary artery of heart (HCC)  (10/17/2011), Diabetes mellitus (10/17/2011), Edema leg, Emphysema, Excessive ear wax, Fatty liver, Hernia, Hyperlipidemia, Hypertension, Leaky heart valve, Neuropathy, Rheumatic fever, Slow urinary stream, and TIA (transient ischemic attack).   Surgical History    Past Surgical History:  Procedure Laterality Date  . CIRCUMCISION    . LITHOTRIPSY       Social History   reports that he has never smoked. He has never used smokeless tobacco. He reports that he does not  drink alcohol or use drugs.   Family History   His family history includes Aneurysm in his mother; Coronary artery disease in his brother, brother, and sister; Emphysema in his father and mother.   Allergies Allergies  Allergen Reactions  . Cashew Nut Oil Palpitations  . Percocet [Oxycodone-Acetaminophen] Other (See Comments)    Hallucintaions     Home Medications  Prior to Admission medications   Medication Sig Start Date End Date Taking? Authorizing Provider  albuterol (PROVENTIL HFA;VENTOLIN HFA) 108 (90 Base) MCG/ACT inhaler Inhale 2 puffs into the lungs every 6 (six) hours as needed for wheezing or shortness of breath. 10/18/17   Erick Blinks, MD  aspirin EC 81 MG tablet Take 81 mg by mouth daily.    [provider]  atorvastatin (LIPITOR) 20 MG tablet Take 20 mg by mouth every evening.     [provider]  azithromycin (ZITHROMAX) 500 MG tablet Take 1 tablet (500 mg total) by mouth daily. 10/18/17   Erick Blinks, MD  carvedilol (COREG) 6.25 MG tablet Take 1 tablet (6.25 mg total) by mouth 2 (two) times daily with a meal. 11/02/16   Bevelyn Ngo, NP  cholecalciferol (VITAMIN D) 1000 units tablet Take 1,000 Units by mouth daily.    [provider]  finasteride (PROSCAR) 5 MG tablet Take 5 mg by mouth daily.    [provider]  gabapentin (NEURONTIN) 300 MG capsule Take 600 mg by mouth 2 (two) times daily.     [provider]  glipiZIDE (GLUCOTROL) 5 MG tablet Take 5 mg  by mouth daily before breakfast.    [provider]  guaiFENesin (MUCINEX) 600 MG 12 hr tablet Take 1 tablet (600 mg total) by mouth 2 (two) times daily. 10/18/17 10/18/18  Erick Blinks, MD  hydrOXYzine (VISTARIL) 50 MG capsule Take 50 mg by mouth 3 (three) times daily.    [provider]  metFORMIN (GLUCOPHAGE-XR) 500 MG 24 hr tablet Take 500 mg by mouth 2 (two) times daily.    [provider]  metoCLOPramide (REGLAN) 5 MG tablet Take 1 tablet (5 mg total) by mouth every 8 (eight) hours as needed for nausea. 10/18/17   Erick Blinks, MD  montelukast (SINGULAIR) 10 MG tablet Take 10 mg by mouth at bedtime.    [provider]  Multiple Vitamin (MULTIVITAMIN) tablet Take 1 tablet by mouth daily.    [provider]  pantoprazole (PROTONIX) 40 MG tablet Take 1 tablet (40 mg total) by mouth daily. 10/11/13   Philip Aspen, Limmie Patricia, MD  tamsulosin (FLOMAX) 0.4 MG CAPS capsule Take 0.4 mg by mouth daily. 05/03/15   [provider]     Critical care time spent with pt, reviewing pt's medical record/chart, pertinent labs/imaging, discussing management plan with ICU multidisciplinary team, making medical decisions, managing pt's resp failure/vent management, excluding procedures : 45 minutes.

## 2018-06-02 ENCOUNTER — Inpatient Hospital Stay (HOSPITAL_COMMUNITY): Payer: Medicare Other

## 2018-06-02 DIAGNOSIS — J189 Pneumonia, unspecified organism: Secondary | ICD-10-CM

## 2018-06-02 DIAGNOSIS — J80 Acute respiratory distress syndrome: Secondary | ICD-10-CM

## 2018-06-02 DIAGNOSIS — G9340 Encephalopathy, unspecified: Secondary | ICD-10-CM

## 2018-06-02 DIAGNOSIS — I340 Nonrheumatic mitral (valve) insufficiency: Secondary | ICD-10-CM

## 2018-06-02 DIAGNOSIS — J9601 Acute respiratory failure with hypoxia: Secondary | ICD-10-CM

## 2018-06-02 DIAGNOSIS — R609 Edema, unspecified: Secondary | ICD-10-CM

## 2018-06-02 LAB — POCT I-STAT 3, ART BLOOD GAS (G3+)
BICARBONATE: 26.4 mmol/L (ref 20.0–28.0)
Bicarbonate: 24.8 mmol/L (ref 20.0–28.0)
O2 SAT: 97 %
O2 Saturation: 99 %
PO2 ART: 152 mmHg — AB (ref 83.0–108.0)
Patient temperature: 98.6
Patient temperature: 98.6
TCO2: 26 mmol/L (ref 22–32)
TCO2: 28 mmol/L (ref 22–32)
pCO2 arterial: 41.1 mmHg (ref 32.0–48.0)
pCO2 arterial: 48.4 mmHg — ABNORMAL HIGH (ref 32.0–48.0)
pH, Arterial: 7.345 — ABNORMAL LOW (ref 7.350–7.450)
pH, Arterial: 7.39 (ref 7.350–7.450)
pO2, Arterial: 89 mmHg (ref 83.0–108.0)

## 2018-06-02 LAB — BLOOD GAS, ARTERIAL
Acid-Base Excess: 0.7 mmol/L (ref 0.0–2.0)
Acid-Base Excess: 0.6 mmol/L (ref 0.0–2.0)
Acid-base deficit: 0.3 mmol/L (ref 0.0–2.0)
BICARBONATE: 24.1 mmol/L (ref 20.0–28.0)
Bicarbonate: 24 mmol/L (ref 20.0–28.0)
Bicarbonate: 25 mmol/L (ref 20.0–28.0)
Drawn by: 249101
Drawn by: 313941
Drawn by: 414221
FIO2: 1
FIO2: 1
FIO2: 100
MECHVT: 550 mL
MECHVT: 550 mL
MECHVT: 550 mL
O2 SAT: 94.7 %
O2 Saturation: 96.6 %
O2 Saturation: 96.8 %
PEEP: 10 cmH2O
PEEP: 12 cmH2O
PEEP: 12 cmH2O
Patient temperature: 97.4
Patient temperature: 98.6
Patient temperature: 98.6
RATE: 20 resp/min
RATE: 24 resp/min
RATE: 24 resp/min
pCO2 arterial: 32.5 mmHg (ref 32.0–48.0)
pCO2 arterial: 34.5 mmHg (ref 32.0–48.0)
pCO2 arterial: 49.9 mmHg — ABNORMAL HIGH (ref 32.0–48.0)
pH, Arterial: 7.321 — ABNORMAL LOW (ref 7.350–7.450)
pH, Arterial: 7.458 — ABNORMAL HIGH (ref 7.350–7.450)
pH, Arterial: 7.478 — ABNORMAL HIGH (ref 7.350–7.450)
pO2, Arterial: 172 mmHg — ABNORMAL HIGH (ref 83.0–108.0)
pO2, Arterial: 85.7 mmHg (ref 83.0–108.0)

## 2018-06-02 LAB — CBC
HCT: 28 % — ABNORMAL LOW (ref 39.0–52.0)
HCT: 30.1 % — ABNORMAL LOW (ref 39.0–52.0)
Hemoglobin: 8.5 g/dL — ABNORMAL LOW (ref 13.0–17.0)
Hemoglobin: 9.3 g/dL — ABNORMAL LOW (ref 13.0–17.0)
MCH: 26.7 pg (ref 26.0–34.0)
MCH: 27.2 pg (ref 26.0–34.0)
MCHC: 30.4 g/dL (ref 30.0–36.0)
MCHC: 30.9 g/dL (ref 30.0–36.0)
MCV: 88.1 fL (ref 80.0–100.0)
MCV: 88 fL (ref 80.0–100.0)
NRBC: 0 % (ref 0.0–0.2)
Platelets: 239 10*3/uL (ref 150–400)
Platelets: 257 10*3/uL (ref 150–400)
RBC: 3.18 MIL/uL — ABNORMAL LOW (ref 4.22–5.81)
RBC: 3.42 MIL/uL — ABNORMAL LOW (ref 4.22–5.81)
RDW: 14.3 % (ref 11.5–15.5)
RDW: 14.5 % (ref 11.5–15.5)
WBC: 10.5 10*3/uL (ref 4.0–10.5)
WBC: 12 10*3/uL — ABNORMAL HIGH (ref 4.0–10.5)
nRBC: 0 % (ref 0.0–0.2)

## 2018-06-02 LAB — RESPIRATORY PANEL BY PCR
Adenovirus: NOT DETECTED
BORDETELLA PERTUSSIS-RVPCR: NOT DETECTED
Chlamydophila pneumoniae: NOT DETECTED
Coronavirus 229E: NOT DETECTED
Coronavirus HKU1: NOT DETECTED
Coronavirus NL63: NOT DETECTED
Coronavirus OC43: NOT DETECTED
INFLUENZA A-RVPPCR: NOT DETECTED
Influenza B: NOT DETECTED
Metapneumovirus: NOT DETECTED
Mycoplasma pneumoniae: NOT DETECTED
PARAINFLUENZA VIRUS 3-RVPPCR: NOT DETECTED
Parainfluenza Virus 1: NOT DETECTED
Parainfluenza Virus 2: NOT DETECTED
Parainfluenza Virus 4: NOT DETECTED
RHINOVIRUS / ENTEROVIRUS - RVPPCR: NOT DETECTED
Respiratory Syncytial Virus: NOT DETECTED

## 2018-06-02 LAB — GLUCOSE, CAPILLARY
GLUCOSE-CAPILLARY: 134 mg/dL — AB (ref 70–99)
GLUCOSE-CAPILLARY: 120 mg/dL — AB (ref 70–99)
Glucose-Capillary: 117 mg/dL — ABNORMAL HIGH (ref 70–99)
Glucose-Capillary: 144 mg/dL — ABNORMAL HIGH (ref 70–99)
Glucose-Capillary: 151 mg/dL — ABNORMAL HIGH (ref 70–99)
Glucose-Capillary: 179 mg/dL — ABNORMAL HIGH (ref 70–99)
Glucose-Capillary: 213 mg/dL — ABNORMAL HIGH (ref 70–99)
Glucose-Capillary: 334 mg/dL — ABNORMAL HIGH (ref 70–99)

## 2018-06-02 LAB — ECHOCARDIOGRAM COMPLETE
Height: 67 in
Weight: 3876.57 oz

## 2018-06-02 LAB — HEPATIC FUNCTION PANEL
ALT: 26 U/L (ref 0–44)
AST: 23 U/L (ref 15–41)
Albumin: 3.3 g/dL — ABNORMAL LOW (ref 3.5–5.0)
Alkaline Phosphatase: 110 U/L (ref 38–126)
Bilirubin, Direct: 0.1 mg/dL (ref 0.0–0.2)
Indirect Bilirubin: 0.3 mg/dL (ref 0.3–0.9)
Total Bilirubin: 0.4 mg/dL (ref 0.3–1.2)
Total Protein: 8.7 g/dL — ABNORMAL HIGH (ref 6.5–8.1)

## 2018-06-02 LAB — PHOSPHORUS: Phosphorus: 3.6 mg/dL (ref 2.5–4.6)

## 2018-06-02 LAB — BASIC METABOLIC PANEL
Anion gap: 11 (ref 5–15)
BUN: 26 mg/dL — ABNORMAL HIGH (ref 8–23)
CO2: 23 mmol/L (ref 22–32)
Calcium: 7.7 mg/dL — ABNORMAL LOW (ref 8.9–10.3)
Chloride: 104 mmol/L (ref 98–111)
Creatinine, Ser: 1.31 mg/dL — ABNORMAL HIGH (ref 0.61–1.24)
GFR calc Af Amer: >60 mL/min (ref >60)
GFR calc non Af Amer: 56 mL/min — ABNORMAL LOW (ref >60)
Glucose, Bld: 157 mg/dL — ABNORMAL HIGH (ref 70–99)
Potassium: 3.5 mmol/L (ref 3.5–5.1)
Sodium: 138 mmol/L (ref 135–145)

## 2018-06-02 LAB — TRIGLYCERIDES: Triglycerides: 215 mg/dL — ABNORMAL HIGH (ref <150)

## 2018-06-02 LAB — MAGNESIUM: MAGNESIUM: 1.2 mg/dL — AB (ref 1.7–2.4)

## 2018-06-02 LAB — TROPONIN I
TROPONIN I: 0.08 ng/mL — AB (ref <0.03)
Troponin I: 0.05 ng/mL (ref <0.03)
Troponin I: 0.07 ng/mL (ref <0.03)
Troponin I: 0.09 ng/mL (ref <0.03)

## 2018-06-02 LAB — LACTIC ACID, PLASMA: Lactic Acid, Venous: 1 mmol/L (ref 0.5–1.9)

## 2018-06-02 LAB — PROCALCITONIN: Procalcitonin: 0.14 ng/mL

## 2018-06-02 LAB — MRSA PCR SCREENING: MRSA by PCR: NEGATIVE

## 2018-06-02 MED ORDER — NYSTATIN 100000 UNIT/GM EX OINT
TOPICAL_OINTMENT | Freq: Two times a day (BID) | CUTANEOUS | Status: DC
  Administered 2018-06-02 – 2018-06-08 (×13): via TOPICAL
  Filled 2018-06-02 (×2): qty 15

## 2018-06-02 MED ORDER — DOCUSATE SODIUM 50 MG/5ML PO LIQD
100.0000 mg | Freq: Two times a day (BID) | ORAL | Status: DC
  Administered 2018-06-02 – 2018-06-04 (×5): 100 mg
  Filled 2018-06-02 (×5): qty 10

## 2018-06-02 MED ORDER — PRO-STAT SUGAR FREE PO LIQD
30.0000 mL | Freq: Three times a day (TID) | ORAL | Status: DC
  Administered 2018-06-02 – 2018-06-04 (×6): 30 mL
  Filled 2018-06-02 (×6): qty 30

## 2018-06-02 MED ORDER — MAGNESIUM SULFATE 4 GM/100ML IV SOLN
4.0000 g | Freq: Once | INTRAVENOUS | Status: AC
  Administered 2018-06-02: 4 g via INTRAVENOUS
  Filled 2018-06-02: qty 100

## 2018-06-02 MED ORDER — VITAL HIGH PROTEIN PO LIQD
1000.0000 mL | ORAL | Status: DC
  Administered 2018-06-02 – 2018-06-07 (×3): 1000 mL
  Filled 2018-06-02 (×2): qty 1000

## 2018-06-02 MED ORDER — POTASSIUM CHLORIDE 20 MEQ/15ML (10%) PO SOLN
40.0000 meq | Freq: Once | ORAL | Status: AC
  Administered 2018-06-02: 40 meq via ORAL
  Filled 2018-06-02: qty 30

## 2018-06-02 MED ORDER — ORAL CARE MOUTH RINSE
15.0000 mL | OROMUCOSAL | Status: DC
  Administered 2018-06-02 – 2018-06-04 (×26): 15 mL via OROMUCOSAL

## 2018-06-02 MED ORDER — NOREPINEPHRINE 4 MG/250ML-% IV SOLN
0.0000 ug/min | INTRAVENOUS | Status: DC
  Administered 2018-06-02: 5 ug/min via INTRAVENOUS
  Filled 2018-06-02 (×2): qty 250

## 2018-06-02 MED ORDER — CHLORHEXIDINE GLUCONATE 0.12% ORAL RINSE (MEDLINE KIT)
15.0000 mL | Freq: Two times a day (BID) | OROMUCOSAL | Status: DC
  Administered 2018-06-02 – 2018-06-04 (×5): 15 mL via OROMUCOSAL

## 2018-06-02 MED ORDER — LACTATED RINGERS IV BOLUS
1000.0000 mL | Freq: Once | INTRAVENOUS | Status: DC
  Administered 2018-06-02: 1000 mL via INTRAVENOUS

## 2018-06-02 MED ORDER — ASPIRIN 81 MG PO CHEW
81.0000 mg | CHEWABLE_TABLET | Freq: Every day | ORAL | Status: DC
  Administered 2018-06-02 – 2018-06-08 (×7): 81 mg via ORAL
  Filled 2018-06-02 (×7): qty 1

## 2018-06-02 NOTE — Procedures (Signed)
Arterial Catheter Insertion Procedure Note [name] [id number] 11/01/1950  Procedure: Insertion of Arterial Catheter  Indications: Blood pressure monitoring and Frequent blood sampling  Procedure Details Consent: Unable to obtain consent because of emergent medical necessity. Time Out: Verified patient identification, verified procedure, site/side was marked, verified correct patient position, special equipment/implants available, medications/allergies/relevent history reviewed, required imaging and test results available.  Performed  Maximum sterile technique was used including antiseptics, cap, gloves, gown, hand hygiene, mask and sheet. Skin prep: Chlorhexidine; local anesthetic administered 22 gauge catheter was inserted into left radial artery using the Seldinger technique. ULTRASOUND GUIDANCE USED: YES Evaluation Blood flow good; BP tracing good. Complications: No apparent complications.  Harold KussmaulKatalina Jami Waller, AGACNP-BC [facility]  PCCM Pgr: [phone]

## 2018-06-02 NOTE — Progress Notes (Signed)
NAME:  [name], MRN:  [id number], DOB:  [birth year], LOS: 1 ADMISSION DATE:  06/01/2018, CONSULTATION DATE:  06/01/18 REFERRING MD:  ed, CHIEF COMPLAINT:  SOB  Brief History   67 yo admitted with resp arrest , intubated emergently in the ED upon arrival.   History of present illness   67 yo male with hx of HTN, morbid obesity , diabetes presenting to the ED in extremis , arrived on a NRB mask , intubated emergently and difficult to oxygenate and ventilate. Per the wife the patient had not been complaining of any SOB recently , fevers or chills and says he says in his usual state of health walking around. Is is unclear what happened as she was not with him when the resp arrest happened. When EMS arrived he was apneic. Unclear if he vomited or aspirated. He has been intubated before for unknown reasons.   Past Medical History  HTN, OSA , DM , obesity  Significant Hospital Events   Intubated in the ED Received lasix on arrival and now receiving some IVF boluses.  Very hard to oxygenate , sedation has been increased and ventilator at PEEP of 12 and FIO2 100 Repeat ABG pending  Consults:  CCM  Procedures:  Intubation 12/1 Might require TLC   Significant Diagnostic Tests:  CXR - severe multifocal pneumonia, ETT in place , OGT in the prox stomach  Micro Data:  pending  Antimicrobials:  Zosyn, vanco, azithro day 1  Objective   Blood pressure (!) 97/54, pulse 71, temperature (!) 96.6 F (35.9 C), resp. rate 20, height 5\' 7"  (1.702 m), weight 109.9 kg, SpO2 99 %.    Vent Mode: PRVC FiO2 (%):  [80 %-100 %] 80 % Set Rate:  [12 bmp-24 bmp] 22 bmp Vt Set:  [550 mL] 550 mL PEEP:  [10 cmH20-12 cmH20] 10 cmH20 Plateau Pressure:  [18 cmH20-27 cmH20] 27 cmH20   Intake/Output Summary (Last 24 hours) at 06/02/2018 0846 Last data filed at 06/02/2018 0800 Gross per 24 hour  Intake 4740.58 ml  Output 1250 ml  Net 3490.58 ml   Filed Weights   06/01/18 2054 06/02/18 0000  Weight: 110.2  kg 109.9 kg    Examination: General: Sedated and ventilated this AM, chronically ill appearing morbidly obese male HEENT: [place]/AT, PERRL, EOM-spontaneous and MMM Neck: Supple, -LAN, ETT in place Lungs: Diffuse rales in all lung fields  Cardiovascular: RRR, Nl S1/S2 and -M/R/G Abdomen: Distended but soft with +BS Extremities: chronic venous stasis b/l in the lower ext , trace pedal edema Skin: Intact Neuro: Sedate, not following commands but supposedly moving all ext to commands prior to sedation   Assessment & Plan:  Acute resp failure with hypoxemia secondary to severe multilobar community acquired pneumonia CXR is pretty impressive with b/l infiltrates worst on the R, wonder if he aspirated and unfortunately is getting worse from the AM CXR that I reviewed myself Plan: Vanc Zosyn Zithromax Change to PCV and decrease sedation to have a respiratory drive then check ABG Propofol and fentanyl drips PRN versed F/U on cultures  Abdominal distension: Plan: OGT to IS - belly is distended but soft Consult nutrition for TF Add colace  Hx of emphysema Plan: Cont nebs No need for steroids as I do not suspect this is an acute exacerbation  Sepsis - secondary to above , BP is stable no signs of shock  Plan: IVF resuscitation Follow on cultures  Diabetes -  Plan: FS goal < 180 , on NISS, might  require some Lantus , will follow  Htn - hold meds   Hx of CAD and chronic systolic CHF with an EF of 45% in 2018 - check EKG and trops , cont aspirin, check TTE  Hx of OSA  Expect prolonged hospital course given severity of pneumonia and body habitus  Best practice:  Diet: nutrition c/s for enteric feeds Pain/Anxiety/Delirium protocol (if indicated): Propofol/Fentanyl , aim for RAAS -4 for now VAP protocol (if indicated): yes DVT prophylaxis: HSQ GI prophylaxis: PPI Glucose control: NISS Mobility: bedrest Code Status: FULL Family Communication: wife updated at  bedside Disposition: ICU , critically ill  Labs   CBC: Recent Labs  Lab 06/01/18 2100 06/02/18 0538  WBC 16.1* 10.5  NEUTROABS 11.1*  --   HGB 12.1* 8.5*  HCT 40.8 28.0*  MCV 90.7 88.1  PLT 409* 239    Basic Metabolic Panel: Recent Labs  Lab 06/01/18 2100 06/02/18 0441  NA 135 138  K 4.5 3.5  CL 99 104  CO2 21* 23  GLUCOSE 316* 157*  BUN 25* 26*  CREATININE 1.32* 1.31*  CALCIUM 9.0 7.7*  MG  --  1.2*  PHOS  --  3.6   GFR: Estimated Creatinine Clearance: 63.8 mL/min (A) (by C-G formula based on SCr of 1.31 mg/dL (H)). Recent Labs  Lab 06/01/18 2100 06/01/18 2107 06/01/18 2300 06/02/18 0015 06/02/18 0538  PROCALCITON  --   --  0.14  --   --   WBC 16.1*  --   --   --  10.5  LATICACIDVEN  --  5.24*  --  1.0  --     Liver Function Tests: Recent Labs  Lab 06/01/18 2300  AST 23  ALT 26  ALKPHOS 110  BILITOT 0.4  PROT 8.7*  ALBUMIN 3.3*   No results for input(s): LIPASE, AMYLASE in the last 168 hours. No results for input(s): AMMONIA in the last 168 hours.  ABG    Component Value Date/Time   PHART 7.458 (H) 06/02/2018 0622   PCO2ART 34.5 06/02/2018 0622   PO2ART 172 (H) 06/02/2018 0622   HCO3 24.1 06/02/2018 0622   TCO2 25 06/01/2018 2102   ACIDBASEDEF 0.3 06/02/2018 0015   O2SAT 96.8 06/02/2018 0622     Coagulation Profile: No results for input(s): INR, PROTIME in the last 168 hours.  Cardiac Enzymes: Recent Labs  Lab 06/02/18 0015 06/02/18 0538  TROPONINI 0.05* 0.07*    HbA1C: Hgb A1c MFr Bld  Date/Time Value Ref Range Status  10/15/2017 01:15 PM 10.6 (H) 4.8 - 5.6 % Final    Comment:    (NOTE) Pre diabetes:          5.7%-6.4% Diabetes:              >6.4% Glycemic control for   <7.0% adults with diabetes   10/28/2016 07:25 AM 10.1 (H) 4.8 - 5.6 % Final    Comment:    (NOTE)         Pre-diabetes: 5.7 - 6.4         Diabetes: >6.4         Glycemic control for adults with diabetes: <7.0     CBG: Recent Labs  Lab  06/01/18 2107 06/01/18 2343 06/02/18 0420 06/02/18 0703  GLUCAP 334* 213* 151* 117*    Review of Systems:   12 point ROS done and negative , pertinent positives are mentioned in the HPI  Past Medical History  He,  has a past medical history of Blockage  of coronary artery of heart (HCC) (10/17/2011), Diabetes mellitus (10/17/2011), Edema leg, Emphysema, Excessive ear wax, Fatty liver, Hernia, Hyperlipidemia, Hypertension, Leaky heart valve, Neuropathy, Rheumatic fever, Slow urinary stream, and TIA (transient ischemic attack).   Surgical History    Past Surgical History:  Procedure Laterality Date  . CIRCUMCISION    . LITHOTRIPSY       Social History   reports that he has never smoked. He has never used smokeless tobacco. He reports that he does not drink alcohol or use drugs.   Family History   His family history includes Aneurysm in his mother; Coronary artery disease in his brother, brother, and sister; Emphysema in his father and mother.   Allergies Allergies  Allergen Reactions  . Cashew Nut Oil Palpitations  . Percocet [Oxycodone-Acetaminophen] Other (See Comments)    Hallucintaions     Home Medications  Prior to Admission medications   Medication Sig Start Date End Date Taking? Authorizing Provider  albuterol (PROVENTIL HFA;VENTOLIN HFA) 108 (90 Base) MCG/ACT inhaler Inhale 2 puffs into the lungs every 6 (six) hours as needed for wheezing or shortness of breath. 10/18/17   Erick Blinks, MD  aspirin EC 81 MG tablet Take 81 mg by mouth daily.    [provider]  atorvastatin (LIPITOR) 20 MG tablet Take 20 mg by mouth every evening.     [provider]  azithromycin (ZITHROMAX) 500 MG tablet Take 1 tablet (500 mg total) by mouth daily. 10/18/17   Erick Blinks, MD  carvedilol (COREG) 6.25 MG tablet Take 1 tablet (6.25 mg total) by mouth 2 (two) times daily with a meal. 11/02/16   Bevelyn Ngo, NP  cholecalciferol (VITAMIN D) 1000 units tablet Take  1,000 Units by mouth daily.    [provider]  finasteride (PROSCAR) 5 MG tablet Take 5 mg by mouth daily.    [provider]  gabapentin (NEURONTIN) 300 MG capsule Take 600 mg by mouth 2 (two) times daily.     [provider]  glipiZIDE (GLUCOTROL) 5 MG tablet Take 5 mg by mouth daily before breakfast.    [provider]  guaiFENesin (MUCINEX) 600 MG 12 hr tablet Take 1 tablet (600 mg total) by mouth 2 (two) times daily. 10/18/17 10/18/18  Erick Blinks, MD  hydrOXYzine (VISTARIL) 50 MG capsule Take 50 mg by mouth 3 (three) times daily.    [provider]  metFORMIN (GLUCOPHAGE-XR) 500 MG 24 hr tablet Take 500 mg by mouth 2 (two) times daily.    [provider]  metoCLOPramide (REGLAN) 5 MG tablet Take 1 tablet (5 mg total) by mouth every 8 (eight) hours as needed for nausea. 10/18/17   Erick Blinks, MD  montelukast (SINGULAIR) 10 MG tablet Take 10 mg by mouth at bedtime.    [provider]  Multiple Vitamin (MULTIVITAMIN) tablet Take 1 tablet by mouth daily.    [provider]  pantoprazole (PROTONIX) 40 MG tablet Take 1 tablet (40 mg total) by mouth daily. 10/11/13   Philip Aspen, Limmie Patricia, MD  tamsulosin (FLOMAX) 0.4 MG CAPS capsule Take 0.4 mg by mouth daily. 05/03/15   [provider]    The patient is critically ill with multiple organ systems failure and requires high complexity decision making for assessment and support, frequent evaluation and titration of therapies, application of advanced monitoring technologies and extensive interpretation of multiple databases.   Critical Care Time devoted to patient care services described in this note is  49  Minutes. This time reflects time of care of this signee Dr Koren BoundWesam Yacoub. This critical care time does not reflect procedure time, or teaching time or supervisory time of PA/NP/Med student/Med Resident etc but could involve care discussion time.  Alyson ReedyWesam G.  Yacoub, M.D. [facility]. Pager: [phone]. After hours pager: [phone].

## 2018-06-02 NOTE — Procedures (Signed)
Central Venous Catheter Insertion Procedure Note Harold ManisRodney Waller [id number] 12/19/1950  Procedure: Insertion of Central Venous Catheter Indications: Assessment of intravascular volume, Drug and/or fluid administration and Frequent blood sampling  Procedure Details Consent: Unable to obtain consent because of emergent medical necessity. Time Out: Verified patient identification, verified procedure, site/side was marked, verified correct patient position, special equipment/implants available, medications/allergies/relevent history reviewed, required imaging and test results available.  Performed  Maximum sterile technique was used including antiseptics, cap, gloves, gown, hand hygiene, mask and sheet. Skin prep: Chlorhexidine; local anesthetic administered A antimicrobial bonded/coated triple lumen catheter was placed in the right internal jugular vein using the Seldinger technique.  Evaluation Blood flow good Complications: No apparent complications Patient did tolerate procedure well. Chest X-ray ordered to verify placement.  CXR: in good position   Harold KussmaulKatalina , AGACNP-BC [facility]  PCCM Pgr: [phone]

## 2018-06-02 NOTE — Progress Notes (Signed)
  Echocardiogram 2D Echocardiogram has been performed.  Delcie RochENNINGTON, Rajat Staver 06/02/2018, 8:48 AM

## 2018-06-02 NOTE — Progress Notes (Signed)
CRITICAL VALUE ALERT  Critical Value:  Troponin 0.05  Date & Time Notied:  06/02/18 @ 0235  Provider Notified: Pola CornElink

## 2018-06-02 NOTE — Progress Notes (Signed)
Bilateral lower extremity venous duplex completed - preliminary results in CV Proc. Graybar ElectricVirginia Quinne Pires, RVS 06/02/2018, 3:01 PM

## 2018-06-02 NOTE — Progress Notes (Signed)
Patient transported to 2M02 without incidence. Report given to receiving RT.

## 2018-06-02 NOTE — Progress Notes (Signed)
Initial Nutrition Assessment  DOCUMENTATION CODES:   Obesity unspecified  INTERVENTION:    Vital High Protein at 45 ml/h (1080 ml per day)  Pro-stat 30 ml TID  Provides 1380 kcal (1554 kcal total with current propofol), 140 gm protein, 903 ml free water daily  NUTRITION DIAGNOSIS:   Inadequate oral intake related to inability to eat as evidenced by NPO status.  GOAL:   Provide needs based on ASPEN/SCCM guidelines  MONITOR:   Vent status, TF tolerance, Labs, I & O's, Skin  REASON FOR ASSESSMENT:   Ventilator, Consult Enteral/tube feeding initiation and management  ASSESSMENT:   67 yo male with PMH of emphysema, fatty liver, TIA, HTN, HLD, neuropathy, DM, leaky heart valve who was admitted on 12/1 with respiratory arrest, requiring emergent intubation.   Received MD Consult for TF initiation and management. OGT in place.   Patient is currently intubated on ventilator support MV: 10.9 L/min Temp (24hrs), Avg:98.2 F (36.8 C), Min:96.2 F (35.7 C), Max:100.7 F (38.2 C)  Propofol: 6.6 ml/hr providing 174 kcal from lipid.  Labs reviewed. CBG's: [phone] Medications reviewed and include colace, novolog, levophed, propofol.  Weight stable over the past 6 months per review of weight encounters.    NUTRITION - FOCUSED PHYSICAL EXAM:    Most Recent Value  Orbital Region  No depletion  Upper Arm Region  No depletion  Thoracic and Lumbar Region  No depletion  Buccal Region  Unable to assess  Temple Region  Mild depletion  Clavicle Bone Region  No depletion  Clavicle and Acromion Bone Region  No depletion  Scapular Bone Region  Unable to assess  Dorsal Hand  No depletion  Patellar Region  No depletion  Anterior Thigh Region  No depletion  Posterior Calf Region  No depletion  Edema (RD Assessment)  Moderate  Hair  Reviewed  Eyes  Unable to assess  Mouth  Unable to assess  Skin  Reviewed  Nails  Reviewed       Diet Order:   Diet Order           Diet NPO time specified  Diet effective now              EDUCATION NEEDS:   No education needs have been identified at this time  Skin:  Skin Assessment: Skin Integrity Issues: Skin Integrity Issues:: Other (Comment) Other: MASD L breast, cellulitis BLE  Last BM:  12/2 (type 5)  Height:   Ht Readings from Last 1 Encounters:  06/01/18 5\' 7"  (1.702 m)    Weight:   Wt Readings from Last 1 Encounters:  06/02/18 109.9 kg    Ideal Body Weight:  67.3 kg  BMI:  Body mass index is 37.95 kg/m.  Estimated Nutritional Needs:   Kcal:  1210-1540  Protein:  >/= 135 gm  Fluid:  2 L    Joaquin CourtsKimberly Harris, RD, LDN, CNSC Pager [phone] After Hours Pager [phone]

## 2018-06-02 NOTE — Consult Note (Signed)
WOC Nurse wound consult note Reason for Consult: Patient consult received for bilateral LEs with several full thickness lesions, the largest of which measures 3cm x 2.5cm x 0.2cm. Also noted is a macular rash with satellite lesions beneath the left breast. Wound type: Bilateral venous insufficiency, MASD with infection Pressure Injury POA:N/A Wound bed: Red, dry (LEs).   Drainage (amount, consistency, odor) None Periwound:erythematous with hemosiderin staining (LEs) Dressing procedure/placement/frequency: I will provide pressure redistribution heel boots bilaterally and guidance for topical care of the LEs providing moist wound healing and mild compression. Sacral prophylactic foam dressing in place.  An antifungal ointment is provided for the left breast.  WOC nursing team will not follow, but will remain available to this patient, the nursing and medical teams.  Please re-consult if needed. Thanks, Ladona MowLaurie Jonathan Kirkendoll, MSN, RN, GNP, Hans EdenCWOCN, CWON-AP, FAAN  Pager# [phone]

## 2018-06-02 NOTE — Progress Notes (Signed)
eLink Physician-Brief Progress Note Patient Name: Harold ManisRodney Geers DOB: 03/11/1951 MRN: [id number]   Date of Service  06/02/2018  HPI/Events of Note  ABG onn 40%/PCV 20/Rate 8/P 10 = 7.39/41/89/24.  eICU Interventions  Continue present ventilator management.      Intervention Category Major Interventions: Acid-Base disturbance - evaluation and management;Respiratory failure - evaluation and management  Lenell AntuSommer,Steven Eugene 06/02/2018, 11:57 PM

## 2018-06-03 ENCOUNTER — Inpatient Hospital Stay (HOSPITAL_COMMUNITY): Payer: Medicare Other

## 2018-06-03 DIAGNOSIS — J810 Acute pulmonary edema: Secondary | ICD-10-CM

## 2018-06-03 LAB — CBC
HEMATOCRIT: 27.8 % — AB (ref 39.0–52.0)
Hemoglobin: 8.4 g/dL — ABNORMAL LOW (ref 13.0–17.0)
MCH: 26.9 pg (ref 26.0–34.0)
MCHC: 30.2 g/dL (ref 30.0–36.0)
MCV: 89.1 fL (ref 80.0–100.0)
Platelets: 228 10*3/uL (ref 150–400)
RBC: 3.12 MIL/uL — ABNORMAL LOW (ref 4.22–5.81)
RDW: 14.7 % (ref 11.5–15.5)
WBC: 9.2 10*3/uL (ref 4.0–10.5)
nRBC: 0 % (ref 0.0–0.2)

## 2018-06-03 LAB — BASIC METABOLIC PANEL
Anion gap: 10 (ref 5–15)
BUN: 25 mg/dL — ABNORMAL HIGH (ref 8–23)
CALCIUM: 7.6 mg/dL — AB (ref 8.9–10.3)
CO2: 25 mmol/L (ref 22–32)
Chloride: 104 mmol/L (ref 98–111)
Creatinine, Ser: 1.43 mg/dL — ABNORMAL HIGH (ref 0.61–1.24)
GFR calc non Af Amer: 50 mL/min — ABNORMAL LOW (ref >60)
GFR, EST AFRICAN AMERICAN: 58 mL/min — AB (ref >60)
Glucose, Bld: 163 mg/dL — ABNORMAL HIGH (ref 70–99)
Potassium: 3.9 mmol/L (ref 3.5–5.1)
Sodium: 139 mmol/L (ref 135–145)

## 2018-06-03 LAB — PHOSPHORUS: Phosphorus: 4.1 mg/dL (ref 2.5–4.6)

## 2018-06-03 LAB — GLUCOSE, CAPILLARY
Glucose-Capillary: 151 mg/dL — ABNORMAL HIGH (ref 70–99)
Glucose-Capillary: 176 mg/dL — ABNORMAL HIGH (ref 70–99)
Glucose-Capillary: 197 mg/dL — ABNORMAL HIGH (ref 70–99)
Glucose-Capillary: 206 mg/dL — ABNORMAL HIGH (ref 70–99)
Glucose-Capillary: 214 mg/dL — ABNORMAL HIGH (ref 70–99)
Glucose-Capillary: 222 mg/dL — ABNORMAL HIGH (ref 70–99)

## 2018-06-03 LAB — LEGIONELLA PNEUMOPHILA SEROGP 1 UR AG: L. pneumophila Serogp 1 Ur Ag: NEGATIVE

## 2018-06-03 LAB — MAGNESIUM: Magnesium: 2.1 mg/dL (ref 1.7–2.4)

## 2018-06-03 MED ORDER — FUROSEMIDE 10 MG/ML IJ SOLN
40.0000 mg | Freq: Four times a day (QID) | INTRAMUSCULAR | Status: AC
  Administered 2018-06-03 (×3): 40 mg via INTRAVENOUS
  Filled 2018-06-03 (×3): qty 4

## 2018-06-03 MED ORDER — MAGNESIUM HYDROXIDE 400 MG/5ML PO SUSP
30.0000 mL | Freq: Once | ORAL | Status: AC
  Administered 2018-06-03: 30 mL
  Filled 2018-06-03: qty 30

## 2018-06-03 MED ORDER — POTASSIUM CHLORIDE 20 MEQ/15ML (10%) PO SOLN
40.0000 meq | Freq: Three times a day (TID) | ORAL | Status: AC
  Administered 2018-06-03 (×2): 40 meq
  Filled 2018-06-03 (×2): qty 30

## 2018-06-03 MED ORDER — PANTOPRAZOLE SODIUM 40 MG PO PACK
40.0000 mg | PACK | Freq: Every day | ORAL | Status: DC
  Administered 2018-06-03: 40 mg
  Filled 2018-06-03 (×2): qty 20

## 2018-06-03 MED ORDER — LABETALOL HCL 100 MG PO TABS
100.0000 mg | ORAL_TABLET | Freq: Three times a day (TID) | ORAL | Status: DC
  Administered 2018-06-03 – 2018-06-05 (×6): 100 mg via ORAL
  Filled 2018-06-03 (×8): qty 1

## 2018-06-03 NOTE — Progress Notes (Addendum)
NAME:  Buelah ManisRodney Cartier, MRN:  [id number], DOB:  06/10/1951, LOS: 2 ADMISSION DATE:  06/01/2018, CONSULTATION DATE:  06/01/18 REFERRING MD:  ed, CHIEF COMPLAINT:  SOB  Brief History   67 yo admitted with resp arrest , intubated emergently in the ED upon arrival.   History of present illness   67 yo male with hx of HTN, morbid obesity , diabetes presenting to the ED in extremis , arrived on a NRB mask , intubated emergently and difficult to oxygenate and ventilate. Per the wife the patient had not been complaining of any SOB recently , fevers or chills and says he says in his usual state of health walking around. Is is unclear what happened as she was not with him when the resp arrest happened. When EMS arrived he was apneic. Unclear if he vomited or aspirated. He has been intubated before for unknown reasons.   Past Medical History  HTN, OSA , DM , obesity  Significant Hospital Events   No events overnight Weaning this AM  Consults:  CCM  Procedures:  Intubation 12/1 Might require TLC   Significant Diagnostic Tests:  CXR - severe multifocal pneumonia, ETT in place , OGT in the prox stomach  Micro Data:  Blood 12/2>>>NTD RVP 12/2 negative MRSA screen negative  Antimicrobials:  Zosyn 12/2>>> Zithromax 12/2>>> Vancomycin 12/2>>>12/3  Objective   Blood pressure (!) 148/82, pulse 89, temperature 99.7 F (37.6 C), resp. rate 14, height 5\' 7"  (1.702 m), weight 113.1 kg, SpO2 97 %.    Vent Mode: PCV FiO2 (%):  [40 %-80 %] 40 % Set Rate:  [8 bmp-9 bmp] 8 bmp PEEP:  [8 cmH20-10 cmH20] 10 cmH20 Plateau Pressure:  [22 cmH20-33 cmH20] 22 cmH20   Intake/Output Summary (Last 24 hours) at 06/03/2018 0818 Last data filed at 06/03/2018 0800 Gross per 24 hour  Intake 3773.92 ml  Output 2900 ml  Net 873.92 ml   Filed Weights   06/01/18 2054 06/02/18 0000 06/03/18 0500  Weight: 110.2 kg 109.9 kg 113.1 kg    Examination: General: Awake and interactive, chronically ill appearing  male HEENT: [place]/AT, PERRL, EOM-I and MMM Neck: Supple, ETT in place, -LAN Lungs: Distant BS Cardiovascular: RRR, Nl S1/S2 and -M/R/G Abdomen: Soft, NT, ND and +BS Extremities: chronic venous stasis b/l in the lower ext , trace pedal edema Skin: Intact Neuro: Awake, answering questions and following commands with all 4 ext   Assessment & Plan:  Acute resp failure with hypoxemia secondary to severe multilobar community acquired pneumonia CXR is pretty impressive with b/l infiltrates worst on the R, wonder if he aspirated and unfortunately is getting worse from the AM CXR that I reviewed myself Plan: D/C Vanc Continue Zosyn until cultures are final Continue Zithromax for 5 days Begin PS trials but no extubation yet until we begin an active weaning process D/C propofol and versed Fentanyl as needed F/U on cultures  Abdominal distension: Plan: If extubating then hold TF Continue colace Add MOM  Hx of emphysema Plan: Cont nebs No need for steroids as I do not suspect this is an acute exacerbation  Sepsis - secondary to above , BP is stable no signs of shock  Plan: KVO IVF Follow on cultures  Diabetes -  Plan: FS goal < 180 , on NISS, might require some Lantus , will follow  Htn -  Labetalol 100 mg PO TID with holding parameters  Hx of CAD and chronic systolic CHF with an EF of 45% in 2018, presenting  with pulmonary edema now Troponins mildly positive Echo with EF of 45-50% with increased pulmonary HTN Lasix 40 mg IV q6 x3 doses  AKI: BMET in AM KVO IVF  Hypokalemia: KCl PO 40 meq q8 x2 doses  Hx of OSA  Best practice:  Diet: nutrition c/s for enteric feeds Pain/Anxiety/Delirium protocol (if indicated): Propofol/Fentanyl , aim for RAAS -4 for now VAP protocol (if indicated): yes DVT prophylaxis: HSQ GI prophylaxis: PPI Glucose control: NISS Mobility: bedrest Code Status: FULL Family Communication: wife updated at bedside Disposition: ICU , critically  ill  Labs   CBC: Recent Labs  Lab 06/01/18 2100 06/02/18 0538 06/02/18 1119 06/03/18 0336  WBC 16.1* 10.5 12.0* 9.2  NEUTROABS 11.1*  --   --   --   HGB 12.1* 8.5* 9.3* 8.4*  HCT 40.8 28.0* 30.1* 27.8*  MCV 90.7 88.1 88.0 89.1  PLT 409* 239 257 228   Basic Metabolic Panel: Recent Labs  Lab 06/01/18 2100 06/02/18 0441 06/03/18 0336  NA 135 138 139  K 4.5 3.5 3.9  CL 99 104 104  CO2 21* 23 25  GLUCOSE 316* 157* 163*  BUN 25* 26* 25*  CREATININE 1.32* 1.31* 1.43*  CALCIUM 9.0 7.7* 7.6*  MG  --  1.2* 2.1  PHOS  --  3.6 4.1   GFR: Estimated Creatinine Clearance: 59.4 mL/min (A) (by C-G formula based on SCr of 1.43 mg/dL (H)). Recent Labs  Lab 06/01/18 2100 06/01/18 2107 06/01/18 2300 06/02/18 0015 06/02/18 0538 06/02/18 1119 06/03/18 0336  PROCALCITON  --   --  0.14  --   --   --   --   WBC 16.1*  --   --   --  10.5 12.0* 9.2  LATICACIDVEN  --  5.24*  --  1.0  --   --   --    Liver Function Tests: Recent Labs  Lab 06/01/18 2300  AST 23  ALT 26  ALKPHOS 110  BILITOT 0.4  PROT 8.7*  ALBUMIN 3.3*   No results for input(s): LIPASE, AMYLASE in the last 168 hours. No results for input(s): AMMONIA in the last 168 hours.  ABG    Component Value Date/Time   PHART 7.390 06/02/2018 2344   PCO2ART 41.1 06/02/2018 2344   PO2ART 89.0 06/02/2018 2344   HCO3 24.8 06/02/2018 2344   TCO2 26 06/02/2018 2344   ACIDBASEDEF 0.3 06/02/2018 0015   O2SAT 97.0 06/02/2018 2344    Coagulation Profile: No results for input(s): INR, PROTIME in the last 168 hours.  Cardiac Enzymes: Recent Labs  Lab 06/02/18 0015 06/02/18 0538 06/02/18 1119 06/02/18 1800  TROPONINI 0.05* 0.07* 0.08* 0.09*   HbA1C: Hgb A1c MFr Bld  Date/Time Value Ref Range Status  10/15/2017 01:15 PM 10.6 (H) 4.8 - 5.6 % Final    Comment:    (NOTE) Pre diabetes:          5.7%-6.4% Diabetes:              >6.4% Glycemic control for   <7.0% adults with diabetes   10/28/2016 07:25 AM 10.1 (H)  4.8 - 5.6 % Final    Comment:    (NOTE)         Pre-diabetes: 5.7 - 6.4         Diabetes: >6.4         Glycemic control for adults with diabetes: <7.0    CBG: Recent Labs  Lab 06/02/18 1504 06/02/18 1919 06/02/18 2341 06/03/18 0335 06/03/18 0715  GLUCAP 120* 179* 144* 151* 176*   Review of Systems:   12 point ROS done and negative , pertinent positives are mentioned in the HPI  Past Medical History  He,  has a past medical history of Blockage of coronary artery of heart (HCC) (10/17/2011), Diabetes mellitus (10/17/2011), Edema leg, Emphysema, Excessive ear wax, Fatty liver, Hernia, Hyperlipidemia, Hypertension, Leaky heart valve, Neuropathy, Rheumatic fever, Slow urinary stream, and TIA (transient ischemic attack).   Surgical History    Past Surgical History:  Procedure Laterality Date  . CIRCUMCISION    . LITHOTRIPSY      Social History   reports that he has never smoked. He has never used smokeless tobacco. He reports that he does not drink alcohol or use drugs.   Family History   His family history includes Aneurysm in his mother; Coronary artery disease in his brother, brother, and sister; Emphysema in his father and mother.   Allergies Allergies  Allergen Reactions  . Cashew Nut Oil Palpitations  . Percocet [Oxycodone-Acetaminophen] Other (See Comments)    Hallucintaions    Home Medications  Prior to Admission medications   Medication Sig Start Date End Date Taking? Authorizing Provider  albuterol (PROVENTIL HFA;VENTOLIN HFA) 108 (90 Base) MCG/ACT inhaler Inhale 2 puffs into the lungs every 6 (six) hours as needed for wheezing or shortness of breath. 10/18/17   Erick Blinks, MD  aspirin EC 81 MG tablet Take 81 mg by mouth daily.    [provider]  atorvastatin (LIPITOR) 20 MG tablet Take 20 mg by mouth every evening.     [provider]  azithromycin (ZITHROMAX) 500 MG tablet Take 1 tablet (500 mg total) by mouth daily. 10/18/17   Erick Blinks, MD  carvedilol (COREG) 6.25 MG tablet Take 1 tablet (6.25 mg total) by mouth 2 (two) times daily with a meal. 11/02/16   Bevelyn Ngo, NP  cholecalciferol (VITAMIN D) 1000 units tablet Take 1,000 Units by mouth daily.    [provider]  finasteride (PROSCAR) 5 MG tablet Take 5 mg by mouth daily.    [provider]  gabapentin (NEURONTIN) 300 MG capsule Take 600 mg by mouth 2 (two) times daily.     [provider]  glipiZIDE (GLUCOTROL) 5 MG tablet Take 5 mg by mouth daily before breakfast.    [provider]  guaiFENesin (MUCINEX) 600 MG 12 hr tablet Take 1 tablet (600 mg total) by mouth 2 (two) times daily. 10/18/17 10/18/18  Erick Blinks, MD  hydrOXYzine (VISTARIL) 50 MG capsule Take 50 mg by mouth 3 (three) times daily.    [provider]  metFORMIN (GLUCOPHAGE-XR) 500 MG 24 hr tablet Take 500 mg by mouth 2 (two) times daily.    [provider]  metoCLOPramide (REGLAN) 5 MG tablet Take 1 tablet (5 mg total) by mouth every 8 (eight) hours as needed for nausea. 10/18/17   Erick Blinks, MD  montelukast (SINGULAIR) 10 MG tablet Take 10 mg by mouth at bedtime.    [provider]  Multiple Vitamin (MULTIVITAMIN) tablet Take 1 tablet by mouth daily.    [provider]  pantoprazole (PROTONIX) 40 MG tablet Take 1 tablet (40 mg total) by mouth daily. 10/11/13   Philip Aspen, Limmie Patricia, MD  tamsulosin (FLOMAX) 0.4 MG CAPS capsule Take 0.4 mg by mouth daily. 05/03/15   [provider]    The patient is critically ill with multiple organ systems failure and requires high complexity decision making  for assessment and support, frequent evaluation and titration of therapies, application of advanced monitoring technologies and extensive interpretation of multiple databases.   Critical Care Time devoted to patient care services described in this note is  32  Minutes. This time reflects time of care of this signee Dr  Koren Bound. This critical care time does not reflect procedure time, or teaching time or supervisory time of PA/NP/Med student/Med Resident etc but could involve care discussion time.  Alyson Reedy, M.D. [facility] Pulmonary/Critical Care Medicine. Pager: [phone]. After hours pager: [phone].

## 2018-06-04 ENCOUNTER — Inpatient Hospital Stay (HOSPITAL_COMMUNITY): Payer: Medicare Other

## 2018-06-04 DIAGNOSIS — I10 Essential (primary) hypertension: Secondary | ICD-10-CM

## 2018-06-04 DIAGNOSIS — E876 Hypokalemia: Secondary | ICD-10-CM

## 2018-06-04 LAB — BASIC METABOLIC PANEL
Anion gap: 12 (ref 5–15)
BUN: 29 mg/dL — ABNORMAL HIGH (ref 8–23)
CALCIUM: 8.3 mg/dL — AB (ref 8.9–10.3)
CO2: 31 mmol/L (ref 22–32)
Chloride: 99 mmol/L (ref 98–111)
Creatinine, Ser: 1.39 mg/dL — ABNORMAL HIGH (ref 0.61–1.24)
GFR calc Af Amer: 60 mL/min — ABNORMAL LOW (ref >60)
GFR, EST NON AFRICAN AMERICAN: 52 mL/min — AB (ref >60)
Glucose, Bld: 188 mg/dL — ABNORMAL HIGH (ref 70–99)
Potassium: 4.1 mmol/L (ref 3.5–5.1)
Sodium: 142 mmol/L (ref 135–145)

## 2018-06-04 LAB — GLUCOSE, CAPILLARY
Glucose-Capillary: 175 mg/dL — ABNORMAL HIGH (ref 70–99)
Glucose-Capillary: 165 mg/dL — ABNORMAL HIGH (ref 70–99)
Glucose-Capillary: 173 mg/dL — ABNORMAL HIGH (ref 70–99)
Glucose-Capillary: 165 mg/dL — ABNORMAL HIGH (ref 70–99)
Glucose-Capillary: 145 mg/dL — ABNORMAL HIGH (ref 70–99)
Glucose-Capillary: 130 mg/dL — ABNORMAL HIGH (ref 70–99)

## 2018-06-04 LAB — POCT I-STAT 3, ART BLOOD GAS (G3+)
Acid-Base Excess: 10 mmol/L — ABNORMAL HIGH (ref 0.0–2.0)
BICARBONATE: 34.6 mmol/L — AB (ref 20.0–28.0)
O2 Saturation: 95 %
Patient temperature: 100
TCO2: 36 mmol/L — AB (ref 22–32)
pCO2 arterial: 50.7 mmHg — ABNORMAL HIGH (ref 32.0–48.0)
pH, Arterial: 7.445 (ref 7.350–7.450)
pO2, Arterial: 77 mmHg — ABNORMAL LOW (ref 83.0–108.0)

## 2018-06-04 LAB — CBC
HCT: 29.9 % — ABNORMAL LOW (ref 39.0–52.0)
Hemoglobin: 9 g/dL — ABNORMAL LOW (ref 13.0–17.0)
MCH: 26.5 pg (ref 26.0–34.0)
MCHC: 30.1 g/dL (ref 30.0–36.0)
MCV: 88.2 fL (ref 80.0–100.0)
PLATELETS: 279 10*3/uL (ref 150–400)
RBC: 3.39 MIL/uL — ABNORMAL LOW (ref 4.22–5.81)
RDW: 14.6 % (ref 11.5–15.5)
WBC: 11.1 10*3/uL — ABNORMAL HIGH (ref 4.0–10.5)
nRBC: 0 % (ref 0.0–0.2)

## 2018-06-04 LAB — PHOSPHORUS: Phosphorus: 3.5 mg/dL (ref 2.5–4.6)

## 2018-06-04 LAB — CULTURE, RESPIRATORY W GRAM STAIN: Culture: NORMAL

## 2018-06-04 LAB — MAGNESIUM: MAGNESIUM: 2 mg/dL (ref 1.7–2.4)

## 2018-06-04 MED ORDER — POTASSIUM CHLORIDE 20 MEQ/15ML (10%) PO SOLN
ORAL | Status: AC
  Filled 2018-06-04: qty 30

## 2018-06-04 MED ORDER — POTASSIUM CHLORIDE 20 MEQ/15ML (10%) PO SOLN
40.0000 meq | Freq: Three times a day (TID) | ORAL | Status: DC
  Administered 2018-06-04: 40 meq

## 2018-06-04 MED ORDER — FUROSEMIDE 10 MG/ML IJ SOLN
40.0000 mg | Freq: Once | INTRAMUSCULAR | Status: AC
  Administered 2018-06-04: 40 mg via INTRAVENOUS
  Filled 2018-06-04: qty 4

## 2018-06-04 MED ORDER — SODIUM CHLORIDE 0.9 % IV SOLN
3.0000 g | Freq: Four times a day (QID) | INTRAVENOUS | Status: AC
  Administered 2018-06-04 – 2018-06-08 (×17): 3 g via INTRAVENOUS
  Filled 2018-06-04 (×19): qty 3

## 2018-06-04 MED ORDER — DOCUSATE SODIUM 100 MG PO CAPS
100.0000 mg | ORAL_CAPSULE | Freq: Two times a day (BID) | ORAL | Status: DC
  Administered 2018-06-05 – 2018-06-07 (×6): 100 mg via ORAL
  Filled 2018-06-04 (×7): qty 1

## 2018-06-04 MED ORDER — LABETALOL HCL 5 MG/ML IV SOLN
10.0000 mg | Freq: Once | INTRAVENOUS | Status: AC
  Administered 2018-06-04: 10 mg via INTRAVENOUS
  Filled 2018-06-04: qty 4

## 2018-06-04 MED ORDER — POTASSIUM CHLORIDE CRYS ER 20 MEQ PO TBCR
40.0000 meq | EXTENDED_RELEASE_TABLET | Freq: Once | ORAL | Status: AC
  Administered 2018-06-04: 40 meq via ORAL
  Filled 2018-06-04: qty 2

## 2018-06-04 MED ORDER — PANTOPRAZOLE SODIUM 40 MG PO TBEC
40.0000 mg | DELAYED_RELEASE_TABLET | Freq: Every day | ORAL | Status: DC
  Administered 2018-06-05: 40 mg via ORAL
  Filled 2018-06-04: qty 1

## 2018-06-04 MED ORDER — FUROSEMIDE 10 MG/ML IJ SOLN
20.0000 mg | Freq: Four times a day (QID) | INTRAMUSCULAR | Status: AC
  Administered 2018-06-04 (×2): 20 mg via INTRAVENOUS
  Filled 2018-06-04 (×2): qty 2

## 2018-06-04 MED ORDER — ORAL CARE MOUTH RINSE
15.0000 mL | Freq: Two times a day (BID) | OROMUCOSAL | Status: DC
  Administered 2018-06-05 – 2018-06-08 (×7): 15 mL via OROMUCOSAL

## 2018-06-04 NOTE — Progress Notes (Signed)
   06/04/18 1500  Clinical Encounter Type  Visited With Patient  Visit Type Initial  Referral From Nurse  Consult/Referral To Chaplain  Spiritual Encounters  Spiritual Needs Prayer;Emotional  [facility] Patient up in bed and willing to talk. Shared his concerns for his granddaughter. Provided encouraging words and empathetic listening. Prayed with patient and told him to reach out again if he felt the need. Provided emotional support.

## 2018-06-04 NOTE — Progress Notes (Signed)
RT obtained ABG from pt with the following results. RT increased pt FIO2 based on ABG PaO2 results. RT will continue to monitor.    Results for [name], [name] (MRN [id number]) as of 06/04/2018 05:48  Ref. Range 06/04/2018 04:51  Sample type Unknown ARTERIAL  pH, Arterial Latest Ref Range: 7.350 - 7.450  7.445  pCO2 arterial Latest Ref Range: 32.0 - 48.0 mmHg 50.7 (H)  pO2, Arterial Latest Ref Range: 83.0 - 108.0 mmHg 77.0 (L)  TCO2 Latest Ref Range: 22 - 32 mmol/L 36 (H)  Acid-Base Excess Latest Ref Range: 0.0 - 2.0 mmol/L 10.0 (H)  Bicarbonate Latest Ref Range: 20.0 - 28.0 mmol/L 34.6 (H)  O2 Saturation Latest Units: % 95.0  Patient temperature Unknown 100.0 F  Collection site Unknown ARTERIAL LINE

## 2018-06-04 NOTE — Progress Notes (Signed)
200 mls IV Fentanyl wasted and witnessed by Barbaraann FasterJennifer Bishop RN after extubation

## 2018-06-04 NOTE — Progress Notes (Addendum)
Pharmacy Antibiotic Note  [name] is a 67 y.o. male admitted on 06/01/2018 with sepsis.  Pt on Zosyn and Azithromycin (Day #3) for multilobar CAP. CXR with b/l infiltrates. ?aspiration. Tm 100. WBC 11.1  Plan: Zosyn 3.375g IV q8h (4 hour infusion).  F/u renal function, cultures and clinical course F/u length of therapy  Addendum (1115): Zosyn changed to Unasyn 3mg  IV q6h F/u LOT  Height: 5\' 7"  (170.2 cm) Weight: 236 lb 15.9 oz (107.5 kg) IBW/kg (Calculated) : 66.1  Temp (24hrs), Avg:100 F (37.8 C), Min:99.5 F (37.5 C), Max:100.6 F (38.1 C)  Recent Labs  Lab 06/01/18 2100 06/01/18 2107 06/02/18 0015 06/02/18 0441 06/02/18 0538 06/02/18 1119 06/03/18 0336 06/04/18 0430  WBC 16.1*  --   --   --  10.5 12.0* 9.2 11.1*  CREATININE 1.32*  --   --  1.31*  --   --  1.43* 1.39*  LATICACIDVEN  --  5.24* 1.0  --   --   --   --   --     Estimated Creatinine Clearance: 59.5 mL/min (A) (by C-G formula based on SCr of 1.39 mg/dL (H)).    Allergies  Allergen Reactions  . Cashew Nut Oil Palpitations  . Percocet [Oxycodone-Acetaminophen] Other (See Comments)    Hallucintaions   2/1 Rocephin x 1 12/2 Vanc>>12/3 12/2 Zosyn>> 12/1 Azith>>12/5  12/1 Strep pneumo: negative 12/2 MRSA PCR: negative 12/1 Trach asp: ngtd 12/1 BCx: ngtd 12/1 Resp panel: negative   Thank you for allowing pharmacy to be a part of this patient's care.  Christoper Fabianaron Naasia Weilbacher, PharmD, BCPS Clinical pharmacist  **Pharmacist phone directory can now be found on [email or web address] ([email or web address]).  [facility]. 06/04/2018 7:49 AM

## 2018-06-04 NOTE — Progress Notes (Signed)
NAME:  [name], MRN:  [id number], DOB:  [birth year], LOS: 3 ADMISSION DATE:  06/01/2018, CONSULTATION DATE:  06/01/18 REFERRING MD:  ed, CHIEF COMPLAINT:  SOB  Brief History   67 yo admitted with resp arrest , intubated emergently in the ED upon arrival.   History of present illness   67 yo male with hx of HTN, morbid obesity , diabetes presenting to the ED in extremis , arrived on a NRB mask , intubated emergently and difficult to oxygenate and ventilate. Per the wife the patient had not been complaining of any SOB recently , fevers or chills and says he says in his usual state of health walking around. Is is unclear what happened as she was not with him when the resp arrest happened. When EMS arrived he was apneic. Unclear if he vomited or aspirated. He has been intubated before for unknown reasons.   Past Medical History  HTN, OSA , DM , obesity  Significant Hospital Events   No events overnight Weaning this AM  Consults:  CCM  Procedures:  Intubation 12/1>>> R IJ TLC 12/1>>>  Significant Diagnostic Tests:  CXR - severe multifocal pneumonia, ETT in place , OGT in the prox stomach  Micro Data:  Blood 12/2>>>NTD RVP 12/2 negative MRSA screen negative  Antimicrobials:  Zosyn 12/2>>> Zithromax 12/2>>> Vancomycin 12/2>>>12/3  Subjective: Diuresed well overnight and weaning well overnight No events overnight  Objective   Blood pressure (!) 147/69, pulse 81, temperature 100 F (37.8 C), resp. rate (!) 27, height 5\' 7"  (1.702 m), weight 107.5 kg, SpO2 97 %.    Vent Mode: PSV;CPAP FiO2 (%):  [40 %-50 %] 40 % Set Rate:  [8 bmp] 8 bmp PEEP:  [5 cmH20] 5 cmH20 Pressure Support:  [5 cmH20] 5 cmH20 Plateau Pressure:  [20 cmH20-24 cmH20] 22 cmH20   Intake/Output Summary (Last 24 hours) at 06/04/2018 0102 Last data filed at 06/04/2018 0600 Gross per 24 hour  Intake 1814.46 ml  Output 7825 ml  Net -6010.54 ml   Filed Weights   06/02/18 0000 06/03/18 0500 06/04/18 0432    Weight: 109.9 kg 113.1 kg 107.5 kg    Examination: General: Awake and interactive, moving all ext to command HEENT: [place]/AT, PERRL, EOM-I and MMM Neck: Supple, ETT in place and -LAN Lungs: Decrease BS Cardiovascular: RRR, Nl S1/S2 and -M/R/G Abdomen: Soft, NT, ND and +BS Extremities: chronic venous stasis b/l in the lower ext , trace pedal edema Skin: Intact Neuro: Awake and interactive, moving all ext to command   Assessment & Plan:  Acute resp failure with hypoxemia secondary to severe multilobar community acquired pneumonia CXR is pretty impressive with b/l infiltrates worst on the R, wonder if he aspirated and unfortunately is getting worse from the AM CXR that I reviewed myself Plan: D/C Vanc Continue zosyn for now Continue zithromax for now Trial extubation for now D/C propofol and versed D/C fentanyl drip F/U on cultures Extubate IS Ambulate Titrate O2 for sat of 88-92%  Abdominal distension: Plan: D/C TF SLP Continue colace  Hx of emphysema Plan: Cont nebs No need for steroids as I do not suspect this is an acute exacerbation  Sepsis - secondary to above , BP is stable no signs of shock  Plan: KVO IVF Follow on cultures  Diabetes -  Plan: FS goal < 180 , on NISS, might require some Lantus , will follow  Htn -  Labetalol 100 mg PO TID with holding parameters, if remains HTN post extubation  will increase to 200 TID with holding parameters  Hx of CAD and chronic systolic CHF with an EF of 45% in 2018, presenting with pulmonary edema now Troponins mildly positive Echo with EF of 45-50% with increased pulmonary HTN Lasix 20 mg IV q6 x2 doses  AKI: BMET in AM KVO IVF  Hypokalemia: KCl PO 40 meq q8 x2 doses  Hx of OSA unsure if uses CPAP at night, will need to ask patient or family, if yes then will order for night.  Labs   CBC: Recent Labs  Lab 06/01/18 2100 06/02/18 0538 06/02/18 1119 06/03/18 0336 06/04/18 0430  WBC 16.1* 10.5 12.0* 9.2  11.1*  NEUTROABS 11.1*  --   --   --   --   HGB 12.1* 8.5* 9.3* 8.4* 9.0*  HCT 40.8 28.0* 30.1* 27.8* 29.9*  MCV 90.7 88.1 88.0 89.1 88.2  PLT 409* 239 257 228 279   Basic Metabolic Panel: Recent Labs  Lab 06/01/18 2100 06/02/18 0441 06/03/18 0336 06/04/18 0430  NA 135 138 139 142  K 4.5 3.5 3.9 4.1  CL 99 104 104 99  CO2 21* 23 25 31   GLUCOSE 316* 157* 163* 188*  BUN 25* 26* 25* 29*  CREATININE 1.32* 1.31* 1.43* 1.39*  CALCIUM 9.0 7.7* 7.6* 8.3*  MG  --  1.2* 2.1 2.0  PHOS  --  3.6 4.1 3.5   GFR: Estimated Creatinine Clearance: 59.5 mL/min (A) (by C-G formula based on SCr of 1.39 mg/dL (H)). Recent Labs  Lab 06/01/18 2107 06/01/18 2300 06/02/18 0015 06/02/18 0538 06/02/18 1119 06/03/18 0336 06/04/18 0430  PROCALCITON  --  0.14  --   --   --   --   --   WBC  --   --   --  10.5 12.0* 9.2 11.1*  LATICACIDVEN 5.24*  --  1.0  --   --   --   --    Liver Function Tests: Recent Labs  Lab 06/01/18 2300  AST 23  ALT 26  ALKPHOS 110  BILITOT 0.4  PROT 8.7*  ALBUMIN 3.3*   No results for input(s): LIPASE, AMYLASE in the last 168 hours. No results for input(s): AMMONIA in the last 168 hours.  ABG    Component Value Date/Time   PHART 7.445 06/04/2018 0451   PCO2ART 50.7 (H) 06/04/2018 0451   PO2ART 77.0 (L) 06/04/2018 0451   HCO3 34.6 (H) 06/04/2018 0451   TCO2 36 (H) 06/04/2018 0451   ACIDBASEDEF 0.3 06/02/2018 0015   O2SAT 95.0 06/04/2018 0451    Coagulation Profile: No results for input(s): INR, PROTIME in the last 168 hours.  Cardiac Enzymes: Recent Labs  Lab 06/02/18 0015 06/02/18 0538 06/02/18 1119 06/02/18 1800  TROPONINI 0.05* 0.07* 0.08* 0.09*   HbA1C: Hgb A1c MFr Bld  Date/Time Value Ref Range Status  10/15/2017 01:15 PM 10.6 (H) 4.8 - 5.6 % Final    Comment:    (NOTE) Pre diabetes:          5.7%-6.4% Diabetes:              >6.4% Glycemic control for   <7.0% adults with diabetes   10/28/2016 07:25 AM 10.1 (H) 4.8 - 5.6 % Final     Comment:    (NOTE)         Pre-diabetes: 5.7 - 6.4         Diabetes: >6.4         Glycemic control for adults with diabetes: <7.0  CBG: Recent Labs  Lab 06/03/18 1509 06/03/18 1942 06/03/18 2332 06/04/18 0339 06/04/18 0720  GLUCAP 206* 197* 214* 175* 165*   The patient is critically ill with multiple organ systems failure and requires high complexity decision making for assessment and support, frequent evaluation and titration of therapies, application of advanced monitoring technologies and extensive interpretation of multiple databases.   Critical Care Time devoted to patient care services described in this note is  34  Minutes. This time reflects time of care of this signee Dr Koren BoundWesam Timera Windt. This critical care time does not reflect procedure time, or teaching time or supervisory time of PA/NP/Med student/Med Resident etc but could involve care discussion time.  Alyson ReedyWesam G. Izreal Kock, M.D. [facility]. Pager: [phone]. After hours pager: [phone].

## 2018-06-04 NOTE — Procedures (Signed)
Extubation Procedure Note  Patient Details:   Name: [name] DOB: 01/08/1951 MRN: [id number]   Airway Documentation:    Vent end date: 06/04/18 Vent end time: 0846   Evaluation  O2 sats: stable throughout Complications: No apparent complications Patient did tolerate procedure well. Bilateral Breath Sounds: Clear, Diminished   Yes   Positive cuff leak noted.  Pt placed on [place] HFNC 12 L with humidity, sat 92%, tolerating well at this time. Pt able to reach 500 mL using incentive spirometer.  No stridor noted.  Forest BeckerJean S Ameerah Huffstetler 06/04/2018, 9:22 AM

## 2018-06-05 ENCOUNTER — Other Ambulatory Visit: Payer: Self-pay

## 2018-06-05 DIAGNOSIS — M7989 Other specified soft tissue disorders: Secondary | ICD-10-CM

## 2018-06-05 DIAGNOSIS — I5023 Acute on chronic systolic (congestive) heart failure: Secondary | ICD-10-CM

## 2018-06-05 DIAGNOSIS — N179 Acute kidney failure, unspecified: Secondary | ICD-10-CM

## 2018-06-05 DIAGNOSIS — A419 Sepsis, unspecified organism: Principal | ICD-10-CM

## 2018-06-05 LAB — GLUCOSE, CAPILLARY
Glucose-Capillary: 125 mg/dL — ABNORMAL HIGH (ref 70–99)
Glucose-Capillary: 120 mg/dL — ABNORMAL HIGH (ref 70–99)
Glucose-Capillary: 200 mg/dL — ABNORMAL HIGH (ref 70–99)
Glucose-Capillary: 150 mg/dL — ABNORMAL HIGH (ref 70–99)

## 2018-06-05 LAB — BASIC METABOLIC PANEL
Anion gap: 13 (ref 5–15)
BUN: 29 mg/dL — ABNORMAL HIGH (ref 8–23)
CO2: 28 mmol/L (ref 22–32)
Calcium: 8 mg/dL — ABNORMAL LOW (ref 8.9–10.3)
Chloride: 99 mmol/L (ref 98–111)
Creatinine, Ser: 1.26 mg/dL — ABNORMAL HIGH (ref 0.61–1.24)
GFR calc Af Amer: >60 mL/min (ref >60)
GFR calc non Af Amer: 58 mL/min — ABNORMAL LOW (ref >60)
GLUCOSE: 137 mg/dL — AB (ref 70–99)
Potassium: 4.2 mmol/L (ref 3.5–5.1)
Sodium: 140 mmol/L (ref 135–145)

## 2018-06-05 LAB — CBC
HCT: 30.9 % — ABNORMAL LOW (ref 39.0–52.0)
Hemoglobin: 9.4 g/dL — ABNORMAL LOW (ref 13.0–17.0)
MCH: 26.8 pg (ref 26.0–34.0)
MCHC: 30.4 g/dL (ref 30.0–36.0)
MCV: 88 fL (ref 80.0–100.0)
Platelets: 294 10*3/uL (ref 150–400)
RBC: 3.51 MIL/uL — ABNORMAL LOW (ref 4.22–5.81)
RDW: 14.4 % (ref 11.5–15.5)
WBC: 9.9 10*3/uL (ref 4.0–10.5)
nRBC: 0 % (ref 0.0–0.2)

## 2018-06-05 LAB — PHOSPHORUS: Phosphorus: 3.6 mg/dL (ref 2.5–4.6)

## 2018-06-05 LAB — MAGNESIUM: Magnesium: 1.8 mg/dL (ref 1.7–2.4)

## 2018-06-05 MED ORDER — ARFORMOTEROL TARTRATE 15 MCG/2ML IN NEBU: 15.0000 ug | INHALATION_SOLUTION | Freq: Two times a day (BID) | RESPIRATORY_TRACT | Status: DC

## 2018-06-05 MED ORDER — BUDESONIDE 0.25 MG/2ML IN SUSP: 0.2500 mg | Freq: Two times a day (BID) | RESPIRATORY_TRACT | Status: DC

## 2018-06-05 MED ORDER — SPIRONOLACTONE 25 MG PO TABS
25.0000 mg | ORAL_TABLET | Freq: Every day | ORAL | Status: DC
  Administered 2018-06-05 – 2018-06-08 (×4): 25 mg via ORAL
  Filled 2018-06-05 (×4): qty 1

## 2018-06-05 MED ORDER — ATORVASTATIN CALCIUM 40 MG PO TABS
40.0000 mg | ORAL_TABLET | Freq: Every evening | ORAL | Status: DC
  Administered 2018-06-05 – 2018-06-07 (×3): 40 mg via ORAL
  Filled 2018-06-05 (×4): qty 1

## 2018-06-05 MED ORDER — CARVEDILOL 6.25 MG PO TABS
6.2500 mg | ORAL_TABLET | Freq: Two times a day (BID) | ORAL | Status: DC
  Administered 2018-06-05 – 2018-06-08 (×6): 6.25 mg via ORAL
  Filled 2018-06-05 (×7): qty 1

## 2018-06-05 MED ORDER — MAGNESIUM SULFATE 2 GM/50ML IV SOLN
2.0000 g | Freq: Once | INTRAVENOUS | Status: AC
  Administered 2018-06-05: 2 g via INTRAVENOUS
  Filled 2018-06-05: qty 50

## 2018-06-05 MED ORDER — UMECLIDINIUM BROMIDE 62.5 MCG/INH IN AEPB: 1.0000 | INHALATION_SPRAY | Freq: Every day | RESPIRATORY_TRACT | Status: DC

## 2018-06-05 MED ORDER — INSULIN ASPART 100 UNIT/ML ~~LOC~~ SOLN
0.0000 [IU] | Freq: Three times a day (TID) | SUBCUTANEOUS | Status: DC
  Administered 2018-06-05 – 2018-06-06 (×3): 3 [IU] via SUBCUTANEOUS
  Administered 2018-06-06: 5 [IU] via SUBCUTANEOUS
  Administered 2018-06-07: 3 [IU] via SUBCUTANEOUS
  Administered 2018-06-07: 5 [IU] via SUBCUTANEOUS
  Administered 2018-06-07: 2 [IU] via SUBCUTANEOUS
  Administered 2018-06-08 (×2): 3 [IU] via SUBCUTANEOUS

## 2018-06-05 MED ORDER — FUROSEMIDE 20 MG PO TABS
20.0000 mg | ORAL_TABLET | Freq: Every day | ORAL | Status: DC
  Administered 2018-06-05 – 2018-06-08 (×4): 20 mg via ORAL
  Filled 2018-06-05 (×4): qty 1

## 2018-06-05 NOTE — Progress Notes (Signed)
NAME:  [name], MRN:  [id number], DOB:  March 07, 1951, LOS: 4 ADMISSION DATE:  06/01/2018, CONSULTATION DATE:  06/01/18 REFERRING MD:  ed, CHIEF COMPLAINT:  SOB  Brief History   67 yo admitted with resp arrest , intubated emergently in the ED upon arrival.   History of present illness   67 yo male with hx of HTN, morbid obesity , diabetes presenting to the ED in extremis , arrived on a NRB mask , intubated emergently and difficult to oxygenate and ventilate. Per the wife the patient had not been complaining of any SOB recently , fevers or chills and says he says in his usual state of health walking around. Is is unclear what happened as she was not with him when the resp arrest happened. When EMS arrived he was apneic. Unclear if he vomited or aspirated. He has been intubated before for unknown reasons.   Past Medical History  HTN, OSA , DM , obesity  Significant Hospital Events     Consults:  CCM  Procedures:  Intubation 12/1>>> R IJ TLC 12/1>>>  Significant Diagnostic Tests:  CXR - severe multifocal pneumonia, ETT in place , OGT in the prox stomach  Micro Data:  Blood 12/2>>>NTD RVP 12/2 negative MRSA screen negative  Antimicrobials:  Vancomycin 12/2>>>12/3 Zosyn 12/2>>> 06/04/2018 Zithromax 12/2>>> Unasyn 06/04/2018 stop date 06/08/2018  Subjective: Awake, alert this morning.  No acute distress, watching television.  Patient states that he is feeling much better.  Would like to try eating something  Objective   Blood pressure 128/75, pulse 82, temperature 99.5 F (37.5 C), temperature source Bladder, resp. rate 18, height 5\' 7"  (1.702 m), weight 103.9 kg, SpO2 91 %.    FiO2 (%):  [40 %] 40 %   Intake/Output Summary (Last 24 hours) at 06/05/2018 0825 Last data filed at 06/05/2018 0800 Gross per 24 hour  Intake 764.75 ml  Output 5300 ml  Net -4535.25 ml   Filed Weights   06/03/18 0500 06/04/18 0432 06/05/18 0451  Weight: 113.1 kg 107.5 kg 103.9 kg     Examination: General appearance: 67 y.o., male no acute distress, up in bed watching television Eyes: anicteric sclerae, moist conjunctivae, PERRLA, tracking appropriately HENT: NCAT; oropharynx, MMM Neck: Trachea midline; supple, lymphadenopathy, no JVD Lungs: Few bibasilar crackles, no wheeze CV: RRR, S1, S2, no MRGs  Abdomen: Soft, non-tender; non-distended, BS present  Extremities: No peripheral edema, radial and DP pulses present bilaterally, does have wrapping of the lower extremities Skin: Normal temperature, turgor and texture; no rash Psych: Appropriate affect, normal mood Neuro: Alert and oriented to person and place, no focal deficit     Assessment & Plan:   Acute resp failure with hypoxemia secondary to severe multilobar community acquired pneumonia and decompensated acute on chronic systolic heart failure requiring intubation and mechanical ventilation, status post extubation and liberation from ventilator on 06/04/2018 CXR from 12/4 reviewed, BL infiltrates, looks more like vascular congestion than dense lobar infiltrate The patient's images have been independently reviewed by me.   Plan: Zithromycin for 5 days De-escalated to unasyn, stop date 06/08/2018  Abdominal distension No bowel movement for the past 3 days Plan: Bowel regimen ordered Discussed with nursing staff Advancing diet slowly.  Tolerated bedside assessment.  Sepsis - secondary to above , BP is stable no signs of shock  Plan: All cultures remain negative. De-escalate antibiotics as above. Stop dates instituted  Diabetes -  Plan: FS goal < 180 , on NISS, might require some Lantus ,  will follow  Hypertension, Hx of CAD and chronic systolic CHF with an EF of 45% in 2018, pulmonary edema  Likely pulmonary hypertension secondary to heart disease and lung disease  Troponin stable Instituted goal-directed heart failure regimen Holding ACE inhibitor secondary to elevated serum creatinine and recent  AKI Started oral Lasix, low-dose spironolactone and changed labetalol to low-dose twice daily Coreg If blood pressure remains elevated would consider increasing Coreg dosing  Junctional tachycardia on 12/2 EKG Stable observing, normal sinus on telemetry  AKI: Serum creatinine stable We will continue to follow if remains stable would recommend initiation of ACE inhibitor  Hypokalemia: Resolved with repletion continue to observe with diuresis Hypomagnesemia: We will replete goal greater than 2  History of secondhand smoke exposure, lifelong non-smoker -Has been labeled with emphysema in the past -Okay to use PRN albuterol  History of OSA  Hyperglycemia: Change CBGs to with meals sliding scale  ICU mobility We will consult PT and OT to assess. Discussed with nursing staff to get up out of bed into chair today. Will advance diet as tolerated   Labs   CBC: Recent Labs  Lab 06/01/18 2100 06/02/18 0538 06/02/18 1119 06/03/18 0336 06/04/18 0430 06/05/18 0449  WBC 16.1* 10.5 12.0* 9.2 11.1* 9.9  NEUTROABS 11.1*  --   --   --   --   --   HGB 12.1* 8.5* 9.3* 8.4* 9.0* 9.4*  HCT 40.8 28.0* 30.1* 27.8* 29.9* 30.9*  MCV 90.7 88.1 88.0 89.1 88.2 88.0  PLT 409* 239 257 228 279 294   Basic Metabolic Panel: Recent Labs  Lab 06/01/18 2100 06/02/18 0441 06/03/18 0336 06/04/18 0430 06/05/18 0449  NA 135 138 139 142 140  K 4.5 3.5 3.9 4.1 4.2  CL 99 104 104 99 99  CO2 21* 23 25 31 28   GLUCOSE 316* 157* 163* 188* 137*  BUN 25* 26* 25* 29* 29*  CREATININE 1.32* 1.31* 1.43* 1.39* 1.26*  CALCIUM 9.0 7.7* 7.6* 8.3* 8.0*  MG  --  1.2* 2.1 2.0 1.8  PHOS  --  3.6 4.1 3.5 3.6   GFR: Estimated Creatinine Clearance: 64.4 mL/min (A) (by C-G formula based on SCr of 1.26 mg/dL (H)). Recent Labs  Lab 06/01/18 2107 06/01/18 2300 06/02/18 0015  06/02/18 1119 06/03/18 0336 06/04/18 0430 06/05/18 0449  PROCALCITON  --  0.14  --   --   --   --   --   --   WBC  --   --   --    < >  12.0* 9.2 11.1* 9.9  LATICACIDVEN 5.24*  --  1.0  --   --   --   --   --    < > = values in this interval not displayed.   Liver Function Tests: Recent Labs  Lab 06/01/18 2300  AST 23  ALT 26  ALKPHOS 110  BILITOT 0.4  PROT 8.7*  ALBUMIN 3.3*   No results for input(s): LIPASE, AMYLASE in the last 168 hours. No results for input(s): AMMONIA in the last 168 hours.  ABG    Component Value Date/Time   PHART 7.445 06/04/2018 0451   PCO2ART 50.7 (H) 06/04/2018 0451   PO2ART 77.0 (L) 06/04/2018 0451   HCO3 34.6 (H) 06/04/2018 0451   TCO2 36 (H) 06/04/2018 0451   ACIDBASEDEF 0.3 06/02/2018 0015   O2SAT 95.0 06/04/2018 0451    Coagulation Profile: No results for input(s): INR, PROTIME in the last 168 hours.  Cardiac Enzymes: Recent  Labs  Lab 06/02/18 0015 06/02/18 0538 06/02/18 1119 06/02/18 1800  TROPONINI 0.05* 0.07* 0.08* 0.09*   HbA1C: Hgb A1c MFr Bld  Date/Time Value Ref Range Status  10/15/2017 01:15 PM 10.6 (H) 4.8 - 5.6 % Final    Comment:    (NOTE) Pre diabetes:          5.7%-6.4% Diabetes:              >6.4% Glycemic control for   <7.0% adults with diabetes   10/28/2016 07:25 AM 10.1 (H) 4.8 - 5.6 % Final    Comment:    (NOTE)         Pre-diabetes: 5.7 - 6.4         Diabetes: >6.4         Glycemic control for adults with diabetes: <7.0    CBG: Recent Labs  Lab 06/04/18 1510 06/04/18 1900 06/04/18 2309 06/05/18 0309 06/05/18 0709  GLUCAP 165* 145* 130* 125* 120*    I have paged the hospitalist service to request ICU downgrade and acceptance to the hospital service tomorrow.   Josephine IgoBradley L Nafeesah Lapaglia, DO [place] [facility] 06/05/2018 8:25 AM  Personal pager: [phone] If unanswered, please page CCM On-call: #[phone]

## 2018-06-05 NOTE — Evaluation (Signed)
Clinical/Bedside Swallow Evaluation Patient Details  Name: [name] MRN: [id number] Date of Birth: 16-May-1951  Today's Date: 06/05/2018 Time: SLP Start Time (ACUTE ONLY): 0954 SLP Stop Time (ACUTE ONLY): 1007 SLP Time Calculation (min) (ACUTE ONLY): 13 min  Past Medical History:  Past Medical History:  Diagnosis Date  . 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 Diabetes mellitus 10/17/2011   newly dx today  . Edema leg    right leg has leaky valve and right foot swells  . Emphysema   . Excessive ear wax   . Fatty liver   . Hernia    near navel  . Hyperlipidemia   . Hypertension   . Leaky heart valve   . Neuropathy   . Rheumatic fever   . Slow urinary stream   . TIA (transient ischemic attack)    Past Surgical History:  Past Surgical History:  Procedure Laterality Date  . CIRCUMCISION    . LITHOTRIPSY     HPI:  Pt is a 67 yo M with hx of HTN, morbid obesity , diabetes presenting to the ED in extremis , arrived on a NRB mask , intubated emergently and difficult to oxygenate and ventilate.  Pt intubated 12/1-12/4.  CXR 12/4: "Diffuse bilateral airspace disease with mild interval improvement"  Per MD review of chest imaging, possibility of aspiration versus vascular congestion.   Assessment / Plan / Recommendation Clinical Impression  Pt presents with functional swallowing as assessed clinically.  Pt tolerated all consistencies trialed with no clinical s/s of aspiration.  Pt exhibited good oral clearance of solids.  Pt's vocal quality is normal and strong.  Pt noted to desaturate to 87 x2, once with POs and once in absences of PO trial.  RN reports some dipping in O2 sats but quick recovery.  Pt did not appear in any distress and denied shortness of breath.  Pt reports feeling of small pills getting stuck in throat.  Recommend trying pills whole in puree if this continues.  Pt was intubated in 2018 with FEES following extubation revealing  "a mild pharyngeal dysphagia with question of esophageal component. Appearance of decreased pharyngeal constriction and laryngeal closure noted during swallow, intermittently completing multiple rapid consecutive swallows to clear thin liquids, however with full airway protection. No aspiration or penetration observed. Mild pharyngeal residuals, largely coating noted post swallow, decreased as study progressed with one episode of slight regurgitation of regular texture solid noted post swallow."    Recommend regular texture diet with thin liquid with aspiration precautions as noted below.  Pt verbalized understanding of swallow precautions.  Should pt experience respiratory decline with PO intake, please make pt NPO and notify speech therapy.  SLP Visit Diagnosis: Dysphagia, oropharyngeal phase (R13.12)    Aspiration Risk  No limitations    Diet Recommendation Regular;Thin liquid   Liquid Administration via: Cup;No straw  Medication Administration: Whole meds with liquid (or whole in puree if preferred)  Supervision: Patient able to self feed  Compensations:   Slow rate;  Small sips/bites;  Rest breaks PRN  Postural Changes: Seated upright at 90 degrees    Other  Recommendations Oral Care Recommendations: Oral care BID   Follow up Recommendations        Frequency and Duration min 1 x/week  2 weeks       Prognosis Prognosis for Safe Diet Advancement: Good      Swallow Study   General Date of Onset: 06/01/18 HPI:  Pt is a 67 yo M with hx of HTN, morbid obesity , diabetes presenting to the ED in extremis , arrived on a NRB mask , intubated emergently and difficult to oxygenate and ventilate.  Pt intubated 12/1-12/4.  CXR 12/4: "Diffuse bilateral airspace disease with mild interval improvement"  Per MD review of chest imaging, possibility of aspiration versus vascular congestion. Type of Study: Bedside Swallow Evaluation Previous Swallow Assessment: FEES in 2018 post  extubation with mild deficits noted but adequate airway protection Diet Prior to this Study: (Full liquid) Temperature Spikes Noted: No Respiratory Status: Nasal cannula History of Recent Intubation: Yes Length of Intubations (days): 3 days Date extubated: 06/04/18 Behavior/Cognition: Alert;Cooperative;Pleasant mood Oral Cavity Assessment: Within Functional Limits Oral Care Completed by SLP: No Oral Cavity - Dentition: Adequate natural dentition Vision: Functional for self-feeding Self-Feeding Abilities: Able to feed self Patient Positioning: Upright in chair Baseline Vocal Quality: Normal Volitional Cough: Strong Volitional Swallow: Able to elicit    Oral/Motor/Sensory Function Overall Oral Motor/Sensory Function: Within functional limits   Ice Chips Ice chips: Not tested   Thin Liquid Thin Liquid: Within functional limits Presentation: Spoon;Cup    Nectar Thick Nectar Thick Liquid: Not tested   Honey Thick Honey Thick Liquid: Not tested   Puree Puree: Within functional limits Presentation: Spoon   Solid     Solid: Within functional limits Presentation: Self Fed      [name] [name] 06/05/2018,10:27 AM

## 2018-06-05 NOTE — Progress Notes (Signed)
Patrick,RN is aware of the central line removal order and will remove the CVC.   Joya Gaskins-Koren Plyler,VAST,RN

## 2018-06-05 NOTE — Progress Notes (Signed)
PCCM:  I spoke with Dr. Margo AyeHall who has agreed from the hospitalist service to assume care on 06/06/2018.  I have placed orders for transfer from the intensive care unit.  Josephine IgoBradley L Icard, DO [place] [facility] 06/05/2018 12:28 PM  Personal pager: [phone] If unanswered, please page CCM On-call: #[phone]

## 2018-06-05 NOTE — Evaluation (Signed)
Physical Therapy Evaluation Patient Details Name: [name] MRN: [id number] DOB: 01/28/1951 Today's Date: 06/05/2018   History of Present Illness  67 y.o male admitted with resp failure thought to due to PNA,  intubated 12/1, extubated 12/04. PMH includes: systolic HF, HTN, HLD,     Clinical Impression  Pt admitted with above diagnosis. Pt currently with functional limitations due to the deficits listed below (see PT Problem List). PTA, pt living at home with wife independent with mobility no home O2 or AD. Today patient moving slowly but able to ambulate short distances in hallway with 8L and RW. With progression and stair training, may be able to get home with HHPT once medcially ready for d/c.  Pt will benefit from skilled PT to increase their independence and safety with mobility to allow discharge to the venue listed below.        Follow Up Recommendations Home health PT;Supervision for mobility/OOB(with progress)    Equipment Recommendations  Rolling walker with 5" wheels    Recommendations for Other Services       Precautions / Restrictions Precautions Precautions: Fall Restrictions Weight Bearing Restrictions: No      Mobility  Bed Mobility Overal bed mobility: Modified Independent             General bed mobility comments: increasd time and effort  Transfers Overall transfer level: Needs assistance   Transfers: Sit to/from Stand Sit to Stand: Min assist         General transfer comment: min A to power up  Ambulation/Gait Ambulation/Gait assistance: Min guard Gait Distance (Feet): 60 Feet Assistive device: Rolling walker (2 wheeled) Gait Pattern/deviations: Step-to pattern;Step-through pattern Gait velocity: decreased   General Gait Details: pt with slow but steady gait inside RW. VSS on 8L  Stairs            Wheelchair Mobility    Modified Rankin (Stroke Patients Only)       Balance Overall balance assessment: No apparent balance  deficits (not formally assessed)                                           Pertinent Vitals/Pain Pain Assessment: No/denies pain    Home Living Family/patient expects to be discharged to:: Private residence Living Arrangements: Spouse/significant other Available Help at Discharge: Family;Available 24 hours/day Type of Home: House Home Access: Stairs to enter Entrance Stairs-Rails: Can reach both Entrance Stairs-Number of Steps: 4 Home Layout: One level Home Equipment: None      Prior Function Level of Independence: Independent               Hand Dominance        Extremity/Trunk Assessment   Upper Extremity Assessment Upper Extremity Assessment: Overall WFL for tasks assessed    Lower Extremity Assessment Lower Extremity Assessment: Overall WFL for tasks assessed       Communication      Cognition Arousal/Alertness: Awake/alert Behavior During Therapy: WFL for tasks assessed/performed Overall Cognitive Status: Within Functional Limits for tasks assessed                                        General Comments      Exercises     Assessment/Plan    PT Assessment Patient needs continued PT services  PT Problem List Decreased strength       PT Treatment Interventions      PT Goals (Current goals can be found in the Care Plan section)  Acute Rehab PT Goals Patient Stated Goal: return home asap PT Goal Formulation: With patient Time For Goal Achievement: 06/19/18 Potential to Achieve Goals: Good    Frequency Min 3X/week   Barriers to discharge        Co-evaluation               AM-PAC PT "6 Clicks" Mobility  Outcome Measure Help needed turning from your back to your side while in a flat bed without using bedrails?: None Help needed moving from lying on your back to sitting on the side of a flat bed without using bedrails?: None Help needed moving to and from a bed to a chair (including a wheelchair)?:  None Help needed standing up from a chair using your arms (e.g., wheelchair or bedside chair)?: A Little Help needed to walk in hospital room?: A Little Help needed climbing 3-5 steps with a railing? : A Little 6 Click Score: 21    End of Session Equipment Utilized During Treatment: Oxygen;Gait belt Activity Tolerance: Patient tolerated treatment well Patient left: in bed;with call bell/phone within reach Nurse Communication: Mobility status PT Visit Diagnosis: Unsteadiness on feet (R26.81);Other abnormalities of gait and mobility (R26.89)    Time: 4098-1191 PT Time Calculation (min) (ACUTE ONLY): 24 min   Charges:   PT Evaluation $PT Eval Moderate Complexity: 1 Mod PT Treatments $Gait Training: 8-22 mins        Etta Grandchild, PT, DPT Acute Rehabilitation Services Pager: [phone] Office: [phone]   Etta Grandchild 06/05/2018, 6:21 PM

## 2018-06-06 LAB — CULTURE, BLOOD (ROUTINE X 2)
CULTURE: NO GROWTH
Culture: NO GROWTH
SPECIAL REQUESTS: ADEQUATE

## 2018-06-06 LAB — GLUCOSE, CAPILLARY
GLUCOSE-CAPILLARY: 130 mg/dL — AB (ref 70–99)
GLUCOSE-CAPILLARY: 159 mg/dL — AB (ref 70–99)
Glucose-Capillary: 174 mg/dL — ABNORMAL HIGH (ref 70–99)
Glucose-Capillary: 215 mg/dL — ABNORMAL HIGH (ref 70–99)
Glucose-Capillary: 190 mg/dL — ABNORMAL HIGH (ref 70–99)

## 2018-06-06 NOTE — Care Management Note (Addendum)
Case Management Note  Patient Details  Name: Buelah ManisRodney Winters MRN: [id number] Date of Birth: 02/09/1951  Subjective/Objective:        Pt admitted with SOB            Action/Plan:   PTA independent from home with wife - wife will provide 24 hour supervision.   Pt has PCP and denied barriers with paying for medications.  CM provided medicare.gov HH list - [facility]  Pt chose [facility] for DME - agency contacted and referral communicated   Expected Discharge Date:                  Expected Discharge Plan:  (Independent from home with wife)  In-House Referral:  Clinical Social Work  Discharge planning Services  CM Consult  Post Acute Care Choice:    Choice offered to:  Patient  DME Arranged:  3-N-1, Walker rolling DME Agency:  [facility]  [facility]  [facility] PT, [facility]  [facility]  Status of Service:  In process, will continue to follow  If discussed at Long Length of Stay Meetings, dates discussed:    Additional Comments:  Cherylann ParrClaxton, Teasha Murrillo S, RN 06/06/2018, 3:20 PM

## 2018-06-06 NOTE — Progress Notes (Signed)
Physical Therapy Treatment Patient Details Name: [name] MRN: [id number] DOB: 11/21/1950 Today's Date: 06/06/2018    History of Present Illness 67 y.o male admitted with resp failure thought to due to PNA,  intubated 12/1, extubated 12/04. PMH includes: systolic HF, HTN, HLD,     PT Comments    Patient reports feeling weaker than last visit, requiring more assistance to stand now min A. HR 80, SpO2 WNL on 8L with activity. High levels of PVCs noted on tele, RN made aware.  Patient eager to progress to HHPT, will need stair training closer to d/c.     Follow Up Recommendations  Home health PT;Supervision for mobility/OOB(with progress)     Equipment Recommendations  Rolling walker with 5" wheels    Recommendations for Other Services       Precautions / Restrictions Precautions Precautions: Fall Restrictions Weight Bearing Restrictions: No    Mobility  Bed Mobility Overal bed mobility: Modified Independent             General bed mobility comments: increasd time and effort  Transfers Overall transfer level: Needs assistance   Transfers: Sit to/from Stand Sit to Stand: Min assist         General transfer comment: min A to power up  Ambulation/Gait Ambulation/Gait assistance: Min guard Gait Distance (Feet): 60 Feet Assistive device: Rolling walker (2 wheeled) Gait Pattern/deviations: Step-to pattern;Step-through pattern Gait velocity: decreased   General Gait Details: pt reports feeling weaker today, unable to progress distance SpO2 WNL on 8L, HR 80   Stairs             Wheelchair Mobility    Modified Rankin (Stroke Patients Only)       Balance Overall balance assessment: No apparent balance deficits (not formally assessed)                                          Cognition Arousal/Alertness: Awake/alert Behavior During Therapy: WFL for tasks assessed/performed Overall Cognitive Status: Within Functional Limits for  tasks assessed                                        Exercises      General Comments        Pertinent Vitals/Pain Pain Assessment: No/denies pain    Home Living                      Prior Function            PT Goals (current goals can now be found in the care plan section) Acute Rehab PT Goals Patient Stated Goal: return home asap PT Goal Formulation: With patient Time For Goal Achievement: 06/19/18 Potential to Achieve Goals: Good Progress towards PT goals: Progressing toward goals    Frequency    Min 3X/week      PT Plan Current plan remains appropriate    Co-evaluation              AM-PAC PT "6 Clicks" Mobility   Outcome Measure  Help needed turning from your back to your side while in a flat bed without using bedrails?: None Help needed moving from lying on your back to sitting on the side of a flat bed without using bedrails?: None Help needed moving to and  from a bed to a chair (including a wheelchair)?: None Help needed standing up from a chair using your arms (e.g., wheelchair or bedside chair)?: A Little Help needed to walk in hospital room?: A Little Help needed climbing 3-5 steps with a railing? : A Little 6 Click Score: 21    End of Session Equipment Utilized During Treatment: Oxygen;Gait belt Activity Tolerance: Patient tolerated treatment well Patient left: in bed;with call bell/phone within reach Nurse Communication: Mobility status PT Visit Diagnosis: Unsteadiness on feet (R26.81);Other abnormalities of gait and mobility (R26.89)     Time: 2130-8657 PT Time Calculation (min) (ACUTE ONLY): 27 min  Charges:  $Gait Training: 8-22 mins $Therapeutic Activity: 8-22 mins                     Etta Grandchild, PT, DPT Acute Rehabilitation Services Pager: [phone] Office: [phone]     Etta Grandchild 06/06/2018, 1:27 PM

## 2018-06-06 NOTE — Progress Notes (Signed)
PROGRESS NOTE    [name]  EAV:[id number] DOB: 10/15/1950 DOA: 06/01/2018 PCP: [facility], Medical, MD (Inactive)     Brief Narrative:  [name] is a 67 year old male with past medical history of hypertension, morbid obesity, diabetes who presented with acute respiratory failure.  He was intubated emergently in the emergency department, found to be difficult to oxygenate and ventilate.  Per the wife, patient had not been complaining of any shortness of breath recently, no fevers or chills, was in his usual state of health previous to hospitalization.  It is unclear what happened as she was out with him with a respiratory arrest happened.  Apparently, he has been intubated before for unknown reasons.  Patient was also given Lasix, broad-spectrum antibiotics for severe multifocal pneumonia, pulmonary edema.  He was extubated and transferred to [facility] team on 12/6.  New events last 24 hours / Subjective: No acute events overnight.  Patient is sitting in bed, states that he needs to urinate.  No complaints of chest pain or shortness of breath on examination.  Assessment & Plan:   Principal Problem:   Acute respiratory failure with hypoxemia (HCC) Active Problems:   CAP (community acquired pneumonia)   Respiratory failure (HCC)   Acute hypoxemic respiratory failure secondary to severe multilobar community-acquired pneumonia as well as acute on chronic systolic and diastolic heart failure Has been extubated, remains on high flow nasal cannula O2 this morning, continue to wean as able  Septic shock sepsis secondary to severe multilobar community-acquired pneumonia Blood cultures negative Respiratory culture negative Respiratory panel PCR negative Questionable aspiration, treated with broad-spectrum antibiotics (vancomycin, Zosyn, azithromycin) deescalated to Unasyn stop date 12/8  Acute on chronic combined systolic and diastolic heart failure Echocardiogram 12/2 revealed EF  45%, grade 2 diastolic dysfunction Started on Lasix, spironolactone, Coreg Monitor I/Os   Diabetes mellitus type II Continue sliding scale insulin  CAD Continue aspirin, lipitor   Acute kidney injury Baseline creatinine ~1.1 Continue to trend creatinine, trending downward  Elevated troponin Flat 0.08, 0.09 and inconsistent with ACS.  This is likely in setting of demand ischemia     DVT prophylaxis: Subq hep Code Status: Full Family Communication: No family at bedside Disposition Plan: Pending further improvement in O2, wean as able   Consultants:   PCCM admit  Antimicrobials:  Anti-infectives (From admission, onward)   Start     Dose/Rate Route Frequency Ordered Stop   06/04/18 1800  Ampicillin-Sulbactam (UNASYN) 3 g in sodium chloride 0.9 % 100 mL IVPB     3 g 200 mL/hr over 30 Minutes Intravenous Every 6 hours 06/04/18 1113 06/08/18 2359   06/02/18 2300  vancomycin (VANCOCIN) 1,250 mg in sodium chloride 0.9 % 250 mL IVPB  Status:  Discontinued     1,250 mg 166.7 mL/hr over 90 Minutes Intravenous Every 24 hours 06/01/18 2317 06/03/18 0855   06/02/18 0800  piperacillin-tazobactam (ZOSYN) IVPB 3.375 g  Status:  Discontinued     3.375 g 12.5 mL/hr over 240 Minutes Intravenous Every 8 hours 06/01/18 2317 06/04/18 1113   06/01/18 2315  vancomycin (VANCOCIN) 2,000 mg in sodium chloride 0.9 % 500 mL IVPB     2,000 mg 250 mL/hr over 120 Minutes Intravenous  Once 06/01/18 2307 06/02/18 0207   06/01/18 2315  piperacillin-tazobactam (ZOSYN) IVPB 3.375 g     3.375 g 100 mL/hr over 30 Minutes Intravenous  Once 06/01/18 2307 06/02/18 0146   06/01/18 2130  cefTRIAXone (ROCEPHIN) 2 g in sodium chloride 0.9 % 100  mL IVPB  Status:  Discontinued     2 g 200 mL/hr over 30 Minutes Intravenous Every 24 hours 06/01/18 2129 06/01/18 2258   06/01/18 2130  azithromycin (ZITHROMAX) 500 mg in sodium chloride 0.9 % 250 mL IVPB     500 mg 250 mL/hr over 60 Minutes Intravenous Every 24 hours  06/01/18 2129 06/06/18 0022       Objective: Vitals:   06/06/18 0800 06/06/18 0814 06/06/18 0900 06/06/18 1125  BP: 138/72  134/85   Pulse: 70  74   Resp: 15  16   Temp:  98.8 F (37.1 C)  98.2 F (36.8 C)  TempSrc:  Oral  Oral  SpO2: 98%  91%   Weight:      Height:        Intake/Output Summary (Last 24 hours) at 06/06/2018 1313 Last data filed at 06/06/2018 0800 Gross per 24 hour  Intake 1617.73 ml  Output 922 ml  Net 695.73 ml   Filed Weights   06/04/18 0432 06/05/18 0451 06/06/18 0500  Weight: 107.5 kg 103.9 kg 97.2 kg    Examination:  General exam: Appears calm and comfortable  Respiratory system: On high flow nasal cannula O2, coarse breath sounds bilaterally Cardiovascular system: S1 & S2 heard, RRR. No JVD, murmurs, rubs, gallops or clicks. + Nonpitting pedal edema. Gastrointestinal system: Abdomen is nondistended, soft and nontender. No organomegaly or masses felt. Normal bowel sounds heard. Central nervous system: Alert and oriented. No focal neurological deficits. Extremities: Symmetric 5 x 5 power. Skin: No rashes, lesions or ulcers Psychiatry: Judgement and insight appear normal. Mood & affect appropriate.   Data Reviewed: I have personally reviewed following labs and imaging studies  CBC: Recent Labs  Lab 06/01/18 2100 06/02/18 0538 06/02/18 1119 06/03/18 0336 06/04/18 0430 06/05/18 0449  WBC 16.1* 10.5 12.0* 9.2 11.1* 9.9  NEUTROABS 11.1*  --   --   --   --   --   HGB 12.1* 8.5* 9.3* 8.4* 9.0* 9.4*  HCT 40.8 28.0* 30.1* 27.8* 29.9* 30.9*  MCV 90.7 88.1 88.0 89.1 88.2 88.0  PLT 409* 239 257 228 279 294   Basic Metabolic Panel: Recent Labs  Lab 06/01/18 2100 06/02/18 0441 06/03/18 0336 06/04/18 0430 06/05/18 0449  NA 135 138 139 142 140  K 4.5 3.5 3.9 4.1 4.2  CL 99 104 104 99 99  CO2 21* 23 25 31 28   GLUCOSE 316* 157* 163* 188* 137*  BUN 25* 26* 25* 29* 29*  CREATININE 1.32* 1.31* 1.43* 1.39* 1.26*  CALCIUM 9.0 7.7* 7.6* 8.3* 8.0*    MG  --  1.2* 2.1 2.0 1.8  PHOS  --  3.6 4.1 3.5 3.6   GFR: Estimated Creatinine Clearance: 62.3 mL/min (A) (by C-G formula based on SCr of 1.26 mg/dL (H)). Liver Function Tests: Recent Labs  Lab 06/01/18 2300  AST 23  ALT 26  ALKPHOS 110  BILITOT 0.4  PROT 8.7*  ALBUMIN 3.3*   No results for input(s): LIPASE, AMYLASE in the last 168 hours. No results for input(s): AMMONIA in the last 168 hours. Coagulation Profile: No results for input(s): INR, PROTIME in the last 168 hours. Cardiac Enzymes: Recent Labs  Lab 06/02/18 0015 06/02/18 0538 06/02/18 1119 06/02/18 1800  TROPONINI 0.05* 0.07* 0.08* 0.09*   BNP (last 3 results) No results for input(s): PROBNP in the last 8760 hours. HbA1C: No results for input(s): HGBA1C in the last 72 hours. CBG: Recent Labs  Lab 06/05/18 0709 06/05/18 1337  06/05/18 1935 06/06/18 0812 06/06/18 1124  GLUCAP 120* 200* 150* 174* 215*   Lipid Profile: No results for input(s): CHOL, HDL, LDLCALC, TRIG, CHOLHDL, LDLDIRECT in the last 72 hours. Thyroid Function Tests: No results for input(s): TSH, T4TOTAL, FREET4, T3FREE, THYROIDAB in the last 72 hours. Anemia Panel: No results for input(s): VITAMINB12, FOLATE, FERRITIN, TIBC, IRON, RETICCTPCT in the last 72 hours. Sepsis Labs: Recent Labs  Lab 06/01/18 2107 06/01/18 2300 06/02/18 0015  PROCALCITON  --  0.14  --   LATICACIDVEN 5.24*  --  1.0    Recent Results (from the past 240 hour(s))  Culture, blood (routine x 2)     Status: None   Collection Time: 06/01/18  8:45 PM  Result Value Ref Range Status   Specimen Description BLOOD LEFT ARM  Final   Special Requests   Final    BOTTLES DRAWN AEROBIC AND ANAEROBIC Blood Culture adequate volume   Culture   Final    NO GROWTH 5 DAYS Performed at [facility], [address], [place], [place] [address]    Report Status 06/06/2018 FINAL  Final  Culture, blood (routine x 2)     Status: None   Collection Time: 06/01/18  8:55 PM   Result Value Ref Range Status   Specimen Description BLOOD RIGHT HAND  Final   Special Requests   Final    BOTTLES DRAWN AEROBIC ONLY Blood Culture results may not be optimal due to an excessive volume of blood received in culture bottles   Culture   Final    NO GROWTH 5 DAYS Performed at [facility], [address], [place], [place] [address]    Report Status 06/06/2018 FINAL  Final  Respiratory Panel by PCR     Status: None   Collection Time: 06/01/18 10:41 PM  Result Value Ref Range Status   Adenovirus NOT DETECTED NOT DETECTED Final   Coronavirus 229E NOT DETECTED NOT DETECTED Final   Coronavirus HKU1 NOT DETECTED NOT DETECTED Final   Coronavirus NL63 NOT DETECTED NOT DETECTED Final   Coronavirus OC43 NOT DETECTED NOT DETECTED Final   Metapneumovirus NOT DETECTED NOT DETECTED Final   Rhinovirus / Enterovirus NOT DETECTED NOT DETECTED Final   Influenza A NOT DETECTED NOT DETECTED Final   Influenza B NOT DETECTED NOT DETECTED Final   Parainfluenza Virus 1 NOT DETECTED NOT DETECTED Final   Parainfluenza Virus 2 NOT DETECTED NOT DETECTED Final   Parainfluenza Virus 3 NOT DETECTED NOT DETECTED Final   Parainfluenza Virus 4 NOT DETECTED NOT DETECTED Final   Respiratory Syncytial Virus NOT DETECTED NOT DETECTED Final   Bordetella pertussis NOT DETECTED NOT DETECTED Final   Chlamydophila pneumoniae NOT DETECTED NOT DETECTED Final   Mycoplasma pneumoniae NOT DETECTED NOT DETECTED Final    Comment: Performed at [facility], [address] [place], [place] [address]  Culture, respiratory (non-expectorated)     Status: None   Collection Time: 06/01/18 10:41 PM  Result Value Ref Range Status   Specimen Description TRACHEAL ASPIRATE  Final   Special Requests NONE  Final   Gram Stain   Final    RARE WBC PRESENT,BOTH PMN AND MONONUCLEAR NO ORGANISMS SEEN    Culture   Final    FEW Consistent with normal respiratory flora. Performed at [facility], [address], [place], [place] [address]    Report Status 06/04/2018 FINAL  Final  MRSA PCR Screening     Status: None  Collection Time: 06/02/18 12:15 AM  Result Value Ref Range Status   MRSA by PCR NEGATIVE NEGATIVE Final    Comment:        The GeneXpert MRSA Assay (FDA approved for NASAL specimens only), is one component of a comprehensive MRSA colonization surveillance program. It is not intended to diagnose MRSA infection nor to guide or monitor treatment for MRSA infections. Performed at [facility], [address], [place], [place] [address]        Radiology Studies: No results found.    Scheduled Meds: . aspirin  81 mg Oral Daily  . atorvastatin  40 mg Oral QPM  . carvedilol  6.25 mg Oral BID WC  . docusate sodium  100 mg Oral BID  . feeding supplement (VITAL HIGH PROTEIN)  1,000 mL Per Tube Q24H  . furosemide  20 mg Oral Daily  . heparin  5,000 Units Subcutaneous Q8H  . insulin aspart  0-15 Units Subcutaneous TID WC  . mouth rinse  15 mL Mouth Rinse BID  . nystatin ointment   Topical BID  . spironolactone  25 mg Oral Daily   Continuous Infusions: . sodium chloride    . ampicillin-sulbactam (UNASYN) IV 3 g (06/06/18 1228)     LOS: 5 days    Time spent: 40 minutes   Noralee Stain, DO [facility] [email or web address] Password [email or web address] 06/06/2018, 1:13 PM

## 2018-06-06 NOTE — Progress Notes (Signed)
  Speech Language Pathology Treatment: Dysphagia  Patient Details Name: [name] MRN: [id number] DOB: 10/29/1950 Today's Date: 06/06/2018 Time: 0900-0910 SLP Time Calculation (min) (ACUTE ONLY): 10 min  Assessment / Plan / Recommendation Clinical Impression  Pt demonstrates normal swallow, tolerated diet over the past 24 hours without further respiratory compromise. Pt to continue diet, no SLP f/u needed will sign off  HPI        SLP Plan  All goals met       Recommendations  Diet recommendations: Regular;Thin liquid Liquids provided via: Cup;Straw Medication Administration: Whole meds with liquid Supervision: Patient able to self feed                Plan: All goals met       GO                Zalia Hautala, Katherene Ponto 06/06/2018, 10:21 AM

## 2018-06-06 NOTE — Evaluation (Signed)
Occupational Therapy Evaluation Patient Details Name: [name] MRN: [id number] DOB: 05/14/1951 Today's Date: 06/06/2018    History of Present Illness 67 y.o male admitted with resp failure thought to due to PNA,  intubated 12/1, extubated 12/04. PMH includes: systolic HF, HTN, HLD,    Clinical Impression   This 67 yo male admitted with above presents to acute OT at a Mod A-setup level for basic ADLs with PLOF being totally independent. He will benefit from acute OT with follow up HHOT and 24 hour S/prn A to get back to his PLOF.      Follow Up Recommendations  Home health OT;Supervision/Assistance - 24 hour    Equipment Recommendations  3 in 1 bedside commode       Precautions / Restrictions Precautions Precautions: Fall Restrictions Weight Bearing Restrictions: No      Mobility Bed Mobility Overal bed mobility: Modified Independent             General bed mobility comments: increasd time and effort, HOB up  Transfers Overall transfer level: Needs assistance   Transfers: Sit to/from Stand;Stand Pivot Transfers Sit to Stand: Min assist Stand pivot transfers: Min assist       General transfer comment: min A to power up    Balance Overall balance assessment: No apparent balance deficits (not formally assessed)                                         ADL either performed or assessed with clinical judgement   ADL Overall ADL's : Needs assistance/impaired Eating/Feeding: Independent;Sitting   Grooming: Set up;Sitting   Upper Body Bathing: Set up;Sitting   Lower Body Bathing: Moderate assistance Lower Body Bathing Details (indicate cue type and reason): min A sit<>stand Upper Body Dressing : Set up;Sitting   Lower Body Dressing: Moderate assistance Lower Body Dressing Details (indicate cue type and reason): min A sit<>stand Toilet Transfer: Minimal assistance;Stand-pivot;RW Toilet Transfer Details (indicate cue type and reason):  Bed>recliner Toileting- Clothing Manipulation and Hygiene: Moderate assistance Toileting - Clothing Manipulation Details (indicate cue type and reason): min A sit<>stand             Vision Patient Visual Report: No change from baseline              Pertinent Vitals/Pain Pain Assessment: No/denies pain     Hand Dominance Right   Extremity/Trunk Assessment Upper Extremity Assessment Upper Extremity Assessment: Overall WFL for tasks assessed           Communication Communication Communication: No difficulties   ognition Arousal/Alertness: Awake/alert Behavior During Therapy: WFL for tasks assessed/performed Overall Cognitive Status: Within Functional Limits for tasks assessed                                                Home Living Family/patient expects to be discharged to:: Private residence Living Arrangements: Spouse/significant other Available Help at Discharge: Family;Available 24 hours/day Type of Home: House Home Access: Stairs to enter Enter[employer] CorporationEntrance Stairs-Number of Steps: 4 Entrance Stairs-Rails: Can reach both Home Layout: One level     Bathroom Shower/Tub: Tub/shower unit;Walk-in shower   Bathroom Toilet: Standard     Home Equipment: None          Prior Functioning/Environment Level of Independence: Independent  OT Problem List: Decreased strength;Impaired balance (sitting and/or standing)      OT Treatment/Interventions: Self-care/ADL training;DME and/or AE instruction;Patient/family education;Balance training    OT Goals(Current goals can be found in the care plan section) Acute Rehab OT Goals Patient Stated Goal: to go home soon OT Goal Formulation: With patient Time For Goal Achievement: 06/20/18 Potential to Achieve Goals: Good  OT Frequency: Min 2X/week              AM-PAC OT "6 Clicks" Daily Activity     Outcome Measure Help from another person eating meals?: None Help from another  person taking care of personal grooming?: A Little Help from another person toileting, which includes using toliet, bedpan, or urinal?: A Little Help from another person bathing (including washing, rinsing, drying)?: A Lot Help from another person to put on and taking off regular upper body clothing?: A Little Help from another person to put on and taking off regular lower body clothing?: A Lot 6 Click Score: 17   End of Session Equipment Utilized During Treatment: Rolling walker Nurse Communication: (RN in room to transfer pt)  Activity Tolerance: Patient tolerated treatment well Patient left: (in W/C getting ready to be transferred to another floor)  OT Visit Diagnosis: Unsteadiness on feet (R26.81);Other abnormalities of gait and mobility (R26.89)                Time: 1610-9604 OT Time Calculation (min): 18 min Charges:  OT General Charges $OT Visit: 1 Visit OT Evaluation $OT Eval Moderate Complexity: 1 Mod  [name], OTR/L Acute [employer] Pager [phone] Office [phone]     [name] 06/06/2018, 1:48 PM

## 2018-06-07 ENCOUNTER — Other Ambulatory Visit: Payer: Self-pay

## 2018-06-07 LAB — BASIC METABOLIC PANEL
ANION GAP: 11 (ref 5–15)
BUN: 32 mg/dL — ABNORMAL HIGH (ref 8–23)
CALCIUM: 8.6 mg/dL — AB (ref 8.9–10.3)
CO2: 27 mmol/L (ref 22–32)
Chloride: 102 mmol/L (ref 98–111)
Creatinine, Ser: 1.31 mg/dL — ABNORMAL HIGH (ref 0.61–1.24)
GFR calc Af Amer: >60 mL/min (ref >60)
GFR calc non Af Amer: 56 mL/min — ABNORMAL LOW (ref >60)
Glucose, Bld: 153 mg/dL — ABNORMAL HIGH (ref 70–99)
Potassium: 4.6 mmol/L (ref 3.5–5.1)
Sodium: 140 mmol/L (ref 135–145)

## 2018-06-07 LAB — CBC
HCT: 32.2 % — ABNORMAL LOW (ref 39.0–52.0)
Hemoglobin: 9.8 g/dL — ABNORMAL LOW (ref 13.0–17.0)
MCH: 26.8 pg (ref 26.0–34.0)
MCHC: 30.4 g/dL (ref 30.0–36.0)
MCV: 88.2 fL (ref 80.0–100.0)
NRBC: 0 % (ref 0.0–0.2)
Platelets: 277 10*3/uL (ref 150–400)
RBC: 3.65 MIL/uL — ABNORMAL LOW (ref 4.22–5.81)
RDW: 13.9 % (ref 11.5–15.5)
WBC: 9.1 10*3/uL (ref 4.0–10.5)

## 2018-06-07 LAB — GLUCOSE, CAPILLARY
GLUCOSE-CAPILLARY: 150 mg/dL — AB (ref 70–99)
Glucose-Capillary: 151 mg/dL — ABNORMAL HIGH (ref 70–99)
Glucose-Capillary: 227 mg/dL — ABNORMAL HIGH (ref 70–99)
Glucose-Capillary: 202 mg/dL — ABNORMAL HIGH (ref 70–99)

## 2018-06-07 NOTE — Progress Notes (Signed)
Turned patient's O2 down from 5 to 3 liters on [place]. Patient tolerating well. Will continue to monitor patient.   Lillia PaulsLaura B. RN

## 2018-06-07 NOTE — Progress Notes (Signed)
PROGRESS NOTE    [name]  ZOX:[id number] DOB: Nov 06, 1950 DOA: 06/01/2018 PCP: [facility], MD (Inactive)     Brief Narrative:  [name] is a 67 year old male with past medical history of hypertension, morbid obesity, diabetes who presented with acute respiratory failure.  He was intubated emergently in the emergency department, found to be difficult to oxygenate and ventilate.  Per the wife, patient had not been complaining of any shortness of breath recently, no fevers or chills, was in his usual state of health previous to hospitalization.  It is unclear what happened as she was out with him with a respiratory arrest happened.  Apparently, he has been intubated before for unknown reasons.  Patient was also given Lasix, broad-spectrum antibiotics for severe multifocal pneumonia, pulmonary edema.  He was extubated and transferred to [facility] on 12/6.  New events last 24 hours / Subjective: Breathing improved although remains on O2   Assessment & Plan:   Principal Problem:   Acute respiratory failure with hypoxemia (HCC) Active Problems:   CAP (community acquired pneumonia)   Respiratory failure (HCC)   Acute hypoxemic respiratory failure secondary to severe multilobar community-acquired pneumonia as well as acute on chronic systolic and diastolic heart failure Has been extubated, remains on nasal cannula O2 this morning, continue to wean as able  Septic shock sepsis secondary to severe multilobar community-acquired pneumonia Blood cultures negative Respiratory culture negative Respiratory panel PCR negative Questionable aspiration, treated with broad-spectrum antibiotics (vancomycin, Zosyn, azithromycin) deescalated to Unasyn stop date 12/8  Acute on chronic combined systolic and diastolic heart failure Echocardiogram 12/2 revealed EF 45%, grade 2 diastolic dysfunction Started on Lasix, spironolactone, Coreg Monitor I/Os   Diabetes mellitus type  II Continue sliding scale insulin  CAD Continue aspirin, lipitor   Acute kidney injury Baseline creatinine ~1.1 Continue to trend creatinine, stable today   Elevated troponin Flat 0.08, 0.09 and inconsistent with ACS.  This is likely in setting of demand ischemia     DVT prophylaxis: Subq hep Code Status: Full Family Communication: No family at bedside Disposition Plan: Pending further improvement in O2, wean as able, home desat screen. Hopefully discharge home 12/8 if better    Consultants:   PCCM admit  Antimicrobials:  Anti-infectives (From admission, onward)   Start     Dose/Rate Route Frequency Ordered Stop   06/04/18 1800  Ampicillin-Sulbactam (UNASYN) 3 g in sodium chloride 0.9 % 100 mL IVPB     3 g 200 mL/hr over 30 Minutes Intravenous Every 6 hours 06/04/18 1113 06/08/18 2359   06/02/18 2300  vancomycin (VANCOCIN) 1,250 mg in sodium chloride 0.9 % 250 mL IVPB  Status:  Discontinued     1,250 mg 166.7 mL/hr over 90 Minutes Intravenous Every 24 hours 06/01/18 2317 06/03/18 0855   06/02/18 0800  piperacillin-tazobactam (ZOSYN) IVPB 3.375 g  Status:  Discontinued     3.375 g 12.5 mL/hr over 240 Minutes Intravenous Every 8 hours 06/01/18 2317 06/04/18 1113   06/01/18 2315  vancomycin (VANCOCIN) 2,000 mg in sodium chloride 0.9 % 500 mL IVPB     2,000 mg 250 mL/hr over 120 Minutes Intravenous  Once 06/01/18 2307 06/02/18 0207   06/01/18 2315  piperacillin-tazobactam (ZOSYN) IVPB 3.375 g     3.375 g 100 mL/hr over 30 Minutes Intravenous  Once 06/01/18 2307 06/02/18 0146   06/01/18 2130  cefTRIAXone (ROCEPHIN) 2 g in sodium chloride 0.9 % 100 mL IVPB  Status:  Discontinued     2 g  200 mL/hr over 30 Minutes Intravenous Every 24 hours 06/01/18 2129 06/01/18 2258   06/01/18 2130  azithromycin (ZITHROMAX) 500 mg in sodium chloride 0.9 % 250 mL IVPB     500 mg 250 mL/hr over 60 Minutes Intravenous Every 24 hours 06/01/18 2129 06/06/18 0022       Objective: Vitals:    06/06/18 1635 06/06/18 2156 06/07/18 0449 06/07/18 0901  BP: 118/76 129/73 (!) 143/77 119/73  Pulse: 66 70 72 66  Resp: 18 20 20 18   Temp: 98.3 F (36.8 C) 98.4 F (36.9 C) 98.2 F (36.8 C) 98 F (36.7 C)  TempSrc: Oral Oral Oral Oral  SpO2: 100% 98% 100% 99%  Weight:      Height:        Intake/Output Summary (Last 24 hours) at 06/07/2018 1442 Last data filed at 06/07/2018 1115 Gross per 24 hour  Intake 1435.7 ml  Output 1251 ml  Net 184.7 ml   Filed Weights   06/04/18 0432 06/05/18 0451 06/06/18 0500  Weight: 107.5 kg 103.9 kg 97.2 kg    Examination: General exam: Appears calm and comfortable  Respiratory system: diminished but clear, no wheeze, Respiratory effort normal. Cardiovascular system: S1 & S2 heard, RRR. No JVD, murmurs, rubs, gallops or clicks. No pedal edema. Gastrointestinal system: Abdomen is nondistended, soft and nontender. No organomegaly or masses felt. Normal bowel sounds heard. Central nervous system: Alert and oriented. No focal neurological deficits. Extremities: Symmetric 5 x 5 power. Skin: No rashes, lesions or ulcers Psychiatry: Judgement and insight appear normal. Mood & affect appropriate.    Data Reviewed: I have personally reviewed following labs and imaging studies  CBC: Recent Labs  Lab 06/01/18 2100  06/02/18 1119 06/03/18 0336 06/04/18 0430 06/05/18 0449 06/07/18 0408  WBC 16.1*   < > 12.0* 9.2 11.1* 9.9 9.1  NEUTROABS 11.1*  --   --   --   --   --   --   HGB 12.1*   < > 9.3* 8.4* 9.0* 9.4* 9.8*  HCT 40.8   < > 30.1* 27.8* 29.9* 30.9* 32.2*  MCV 90.7   < > 88.0 89.1 88.2 88.0 88.2  PLT 409*   < > 257 228 279 294 277   < > = values in this interval not displayed.   Basic Metabolic Panel: Recent Labs  Lab 06/02/18 0441 06/03/18 0336 06/04/18 0430 06/05/18 0449 06/07/18 0408  NA 138 139 142 140 140  K 3.5 3.9 4.1 4.2 4.6  CL 104 104 99 99 102  CO2 23 25 31 28 27   GLUCOSE 157* 163* 188* 137* 153*  BUN 26* 25* 29* 29*  32*  CREATININE 1.31* 1.43* 1.39* 1.26* 1.31*  CALCIUM 7.7* 7.6* 8.3* 8.0* 8.6*  MG 1.2* 2.1 2.0 1.8  --   PHOS 3.6 4.1 3.5 3.6  --    GFR: Estimated Creatinine Clearance: 59.9 mL/min (A) (by C-G formula based on SCr of 1.31 mg/dL (H)). Liver Function Tests: Recent Labs  Lab 06/01/18 2300  AST 23  ALT 26  ALKPHOS 110  BILITOT 0.4  PROT 8.7*  ALBUMIN 3.3*   No results for input(s): LIPASE, AMYLASE in the last 168 hours. No results for input(s): AMMONIA in the last 168 hours. Coagulation Profile: No results for input(s): INR, PROTIME in the last 168 hours. Cardiac Enzymes: Recent Labs  Lab 06/02/18 0015 06/02/18 0538 06/02/18 1119 06/02/18 1800  TROPONINI 0.05* 0.07* 0.08* 0.09*   BNP (last 3 results) No results for input(s):  PROBNP in the last 8760 hours. HbA1C: No results for input(s): HGBA1C in the last 72 hours. CBG: Recent Labs  Lab 06/06/18 1333 06/06/18 1636 06/06/18 2156 06/07/18 0724 06/07/18 1119  GLUCAP 130* 159* 190* 151* 227*   Lipid Profile: No results for input(s): CHOL, HDL, LDLCALC, TRIG, CHOLHDL, LDLDIRECT in the last 72 hours. Thyroid Function Tests: No results for input(s): TSH, T4TOTAL, FREET4, T3FREE, THYROIDAB in the last 72 hours. Anemia Panel: No results for input(s): VITAMINB12, FOLATE, FERRITIN, TIBC, IRON, RETICCTPCT in the last 72 hours. Sepsis Labs: Recent Labs  Lab 06/01/18 2107 06/01/18 2300 06/02/18 0015  PROCALCITON  --  0.14  --   LATICACIDVEN 5.24*  --  1.0    Recent Results (from the past 240 hour(s))  Culture, blood (routine x 2)     Status: None   Collection Time: 06/01/18  8:45 PM  Result Value Ref Range Status   Specimen Description BLOOD LEFT ARM  Final   Special Requests   Final    BOTTLES DRAWN AEROBIC AND ANAEROBIC Blood Culture adequate volume   Culture   Final    NO GROWTH 5 DAYS Performed at [facility], [address] [place], [place] [address]    Report Status 06/06/2018 FINAL  Final    Culture, blood (routine x 2)     Status: None   Collection Time: 06/01/18  8:55 PM  Result Value Ref Range Status   Specimen Description BLOOD RIGHT HAND  Final   Special Requests   Final    BOTTLES DRAWN AEROBIC ONLY Blood Culture results may not be optimal due to an excessive volume of blood received in culture bottles   Culture   Final    NO GROWTH 5 DAYS Performed at [facility], [address], [place], [place] [address]    Report Status 06/06/2018 FINAL  Final  Respiratory Panel by PCR     Status: None   Collection Time: 06/01/18 10:41 PM  Result Value Ref Range Status   Adenovirus NOT DETECTED NOT DETECTED Final   Coronavirus 229E NOT DETECTED NOT DETECTED Final   Coronavirus HKU1 NOT DETECTED NOT DETECTED Final   Coronavirus NL63 NOT DETECTED NOT DETECTED Final   Coronavirus OC43 NOT DETECTED NOT DETECTED Final   Metapneumovirus NOT DETECTED NOT DETECTED Final   Rhinovirus / Enterovirus NOT DETECTED NOT DETECTED Final   Influenza A NOT DETECTED NOT DETECTED Final   Influenza B NOT DETECTED NOT DETECTED Final   Parainfluenza Virus 1 NOT DETECTED NOT DETECTED Final   Parainfluenza Virus 2 NOT DETECTED NOT DETECTED Final   Parainfluenza Virus 3 NOT DETECTED NOT DETECTED Final   Parainfluenza Virus 4 NOT DETECTED NOT DETECTED Final   Respiratory Syncytial Virus NOT DETECTED NOT DETECTED Final   Bordetella pertussis NOT DETECTED NOT DETECTED Final   Chlamydophila pneumoniae NOT DETECTED NOT DETECTED Final   Mycoplasma pneumoniae NOT DETECTED NOT DETECTED Final    Comment: Performed at [facility], [address], [place], [place] [address]  Culture, respiratory (non-expectorated)     Status: None   Collection Time: 06/01/18 10:41 PM  Result Value Ref Range Status   Specimen Description TRACHEAL ASPIRATE  Final   Special Requests NONE  Final   Gram Stain   Final    RARE WBC PRESENT,BOTH PMN AND MONONUCLEAR NO ORGANISMS SEEN    Culture   Final    FEW  Consistent with normal respiratory flora. Performed at [facility], [address], [place], [place] [address]    Report Status 06/04/2018 FINAL  Final  MRSA PCR Screening     Status: None   Collection Time: 06/02/18 12:15 AM  Result Value Ref Range Status   MRSA by PCR NEGATIVE NEGATIVE Final    Comment:        The GeneXpert MRSA Assay (FDA approved for NASAL specimens only), is one component of a comprehensive MRSA colonization surveillance program. It is not intended to diagnose MRSA infection nor to guide or monitor treatment for MRSA infections. Performed at [facility], [address] [place], [place] [address]        Radiology Studies: No results found.    Scheduled Meds: . aspirin  81 mg Oral Daily  . atorvastatin  40 mg Oral QPM  . carvedilol  6.25 mg Oral BID WC  . docusate sodium  100 mg Oral BID  . feeding supplement (VITAL HIGH PROTEIN)  1,000 mL Per Tube Q24H  . furosemide  20 mg Oral Daily  . heparin  5,000 Units Subcutaneous Q8H  . insulin aspart  0-15 Units Subcutaneous TID WC  . mouth rinse  15 mL Mouth Rinse BID  . nystatin ointment   Topical BID  . spironolactone  25 mg Oral Daily   Continuous Infusions: . sodium chloride 10 mL/hr at 06/06/18 1813  . ampicillin-sulbactam (UNASYN) IV 3 g (06/07/18 1141)     LOS: 6 days    Time spent: 25 minutes   Noralee Stain, DO [facility] [email or web address] Password [email or web address] 06/07/2018, 2:42 PM

## 2018-06-08 LAB — CBC
HEMATOCRIT: 32 % — AB (ref 39.0–52.0)
Hemoglobin: 9.9 g/dL — ABNORMAL LOW (ref 13.0–17.0)
MCH: 27.2 pg (ref 26.0–34.0)
MCHC: 30.9 g/dL (ref 30.0–36.0)
MCV: 87.9 fL (ref 80.0–100.0)
Platelets: 280 10*3/uL (ref 150–400)
RBC: 3.64 MIL/uL — ABNORMAL LOW (ref 4.22–5.81)
RDW: 13.7 % (ref 11.5–15.5)
WBC: 9.3 10*3/uL (ref 4.0–10.5)
nRBC: 0 % (ref 0.0–0.2)

## 2018-06-08 LAB — BASIC METABOLIC PANEL
Anion gap: 10 (ref 5–15)
BUN: 28 mg/dL — ABNORMAL HIGH (ref 8–23)
CO2: 27 mmol/L (ref 22–32)
Calcium: 8.7 mg/dL — ABNORMAL LOW (ref 8.9–10.3)
Chloride: 101 mmol/L (ref 98–111)
Creatinine, Ser: 1.29 mg/dL — ABNORMAL HIGH (ref 0.61–1.24)
GFR calc Af Amer: >60 mL/min (ref >60)
GFR calc non Af Amer: 57 mL/min — ABNORMAL LOW (ref >60)
Glucose, Bld: 175 mg/dL — ABNORMAL HIGH (ref 70–99)
Potassium: 4.9 mmol/L (ref 3.5–5.1)
Sodium: 138 mmol/L (ref 135–145)

## 2018-06-08 LAB — GLUCOSE, CAPILLARY
Glucose-Capillary: 158 mg/dL — ABNORMAL HIGH (ref 70–99)
Glucose-Capillary: 177 mg/dL — ABNORMAL HIGH (ref 70–99)

## 2018-06-08 MED ORDER — FUROSEMIDE 20 MG PO TABS: 20.0000 mg | ORAL_TABLET | Freq: Every day | ORAL | 0 refills | Status: DC

## 2018-06-08 MED ORDER — SPIRONOLACTONE 25 MG PO TABS: 25.0000 mg | ORAL_TABLET | Freq: Every day | ORAL | 0 refills | Status: DC

## 2018-06-08 MED ORDER — CARVEDILOL 6.25 MG PO TABS: 6.2500 mg | ORAL_TABLET | Freq: Two times a day (BID) | ORAL | 0 refills | Status: DC

## 2018-06-08 MED ORDER — ATORVASTATIN CALCIUM 40 MG PO TABS: 40.0000 mg | ORAL_TABLET | Freq: Every evening | ORAL | 0 refills | Status: DC

## 2018-06-08 MED ORDER — GABAPENTIN 400 MG PO CAPS
400.0000 mg | ORAL_CAPSULE | Freq: Once | ORAL | Status: AC
  Administered 2018-06-08: 400 mg via ORAL
  Filled 2018-06-08: qty 1

## 2018-06-08 NOTE — Discharge Summary (Addendum)
Physician Discharge Summary  [name] ZOX:[id number] DOB: [birth year] DOA: 06/01/2018  PCP: Clinic, Medical, MD (Inactive) Follows with PCP at [facility]   Admit date: 06/01/2018 Discharge date: 06/08/2018  Admitted From: Home Disposition:  Home  Recommendations for Outpatient Follow-up:  1. Follow up with PCP in 1 week 2. Please obtain BMP in 1 week while on diuretic tx.   Discharge Condition: Stable CODE STATUS: Full  Diet recommendation: heart healthy, carb modified   Brief/Interim Summary: [name] is a 67 year old male with past medical history of hypertension, morbid obesity, diabetes who presented with acute respiratory failure.  He was intubated emergently in the emergency department, found to be difficult to oxygenate and ventilate.  Per the wife, patient had not been complaining of any shortness of breath recently, no fevers or chills, was in his usual state of health previous to hospitalization.  It is unclear what happened as she was out with him with a respiratory arrest happened.  Apparently, he has been intubated before for unknown reasons.  Patient was also given Lasix, broad-spectrum antibiotics for severe multifocal pneumonia, pulmonary edema.  He was extubated and transferred to [facility] hospital[facility] team on 12/6. He was continued on treatments including antibiotics, diuretic and continued to improve. He was weaned off O2 prior to discharge.   Discharge Diagnoses:  Principal Problem:   Acute respiratory failure with hypoxemia (HCC) Active Problems:   CAP (community acquired pneumonia)   Respiratory failure (HCC)   Acute hypoxemic respiratory failure secondary to severe multilobar community-acquired pneumonia as well as acute on chronic systolic and diastolic heart failure Has been extubated, weaned off [place] O2. On room air this morning.   Septic shock sepsis secondary to severe multilobar community-acquired pneumonia Blood cultures negative Respiratory  culture negative Respiratory panel PCR negative Questionable aspiration, treated with broad-spectrum antibiotics (vancomycin, Zosyn, azithromycin) deescalated to Unasyn stop date 12/8   Acute on chronic combined systolic and diastolic heart failure Echocardiogram 12/2 revealed EF 45%, grade 2 diastolic dysfunction Started on Lasix, spironolactone, Coreg Monitor I/Os   Diabetes mellitus type II Resume metformin   CAD Continue aspirin, lipitor   Acute kidney injury Baseline creatinine ~1.1 Continue to trend creatinine, stable today   Elevated troponin Flat 0.08, 0.09 and inconsistent with ACS.  This is likely in setting of demand ischemia   Discharge Instructions  Discharge Instructions    (HEART FAILURE PATIENTS) Call MD:  Anytime you have any of the following symptoms: 1) 3 pound weight gain in 24 hours or 5 pounds in 1 week 2) shortness of breath, with or without a dry hacking cough 3) swelling in the hands, feet or stomach 4) if you have to sleep on extra pillows at night in order to breathe.   Complete by:  As directed    Call MD for:  difficulty breathing, headache or visual disturbances   Complete by:  As directed    Call MD for:  extreme fatigue   Complete by:  As directed    Call MD for:  persistant dizziness or light-headedness   Complete by:  As directed    Call MD for:  persistant nausea and vomiting   Complete by:  As directed    Call MD for:  severe uncontrolled pain   Complete by:  As directed    Call MD for:  temperature >100.4   Complete by:  As directed    Diet - low sodium heart healthy   Complete by:  As directed  Diet Carb Modified   Complete by:  As directed    Discharge instructions   Complete by:  As directed    You were cared for by a hospitalist during your hospital stay. If you have any questions about your discharge medications or the care you received while you were in the hospital after you are discharged, you can call the unit and ask  to speak with the hospitalist on call if the hospitalist that took care of you is not available. Once you are discharged, your primary care physician will handle any further medical issues. Please note that NO REFILLS for any discharge medications will be authorized once you are discharged, as it is imperative that you return to your primary care physician (or establish a relationship with a primary care physician if you do not have one) for your aftercare needs so that they can reassess your need for medications and monitor your lab values.   Increase activity slowly   Complete by:  As directed      Allergies as of 06/08/2018      Reactions   Cashew Nut Oil Palpitations   Percocet [oxycodone-acetaminophen] Other (See Comments)   Hallucintaions      Medication List    STOP taking these medications   guaiFENesin 600 MG 12 hr tablet Commonly known as:  MUCINEX   metoCLOPramide 5 MG tablet Commonly known as:  REGLAN   valsartan-hydrochlorothiazide 320-25 MG tablet Commonly known as:  DIOVAN-HCT     TAKE these medications   albuterol 108 (90 Base) MCG/ACT inhaler Commonly known as:  PROVENTIL HFA;VENTOLIN HFA Inhale 2 puffs into the lungs every 6 (six) hours as needed for wheezing or shortness of breath.   aspirin EC 81 MG tablet Take 81 mg by mouth daily.   atorvastatin 40 MG tablet Commonly known as:  LIPITOR Take 1 tablet (40 mg total) by mouth every evening. What changed:    medication strength  how much to take   carvedilol 6.25 MG tablet Commonly known as:  COREG Take 1 tablet (6.25 mg total) by mouth 2 (two) times daily with a meal.   cholecalciferol 1000 units tablet Commonly known as:  VITAMIN D Take 1,000 Units by mouth daily.   finasteride 5 MG tablet Commonly known as:  PROSCAR Take 5 mg by mouth daily.   furosemide 20 MG tablet Commonly known as:  LASIX Take 1 tablet (20 mg total) by mouth daily. Start taking on:  06/09/2018   gabapentin 300 MG  capsule Commonly known as:  NEURONTIN Take 600 mg by mouth 2 (two) times daily.   hydrOXYzine 50 MG capsule Commonly known as:  VISTARIL Take 50 mg by mouth 3 (three) times daily.   metFORMIN 1000 MG tablet Commonly known as:  GLUCOPHAGE Take 1,000 mg by mouth 2 (two) times daily.   montelukast 10 MG tablet Commonly known as:  SINGULAIR Take 10 mg by mouth at bedtime.   multivitamin tablet Take 1 tablet by mouth daily.   pantoprazole 40 MG tablet Commonly known as:  PROTONIX Take 1 tablet (40 mg total) by mouth daily.   spironolactone 25 MG tablet Commonly known as:  ALDACTONE Take 1 tablet (25 mg total) by mouth daily. Start taking on:  06/09/2018   tamsulosin 0.4 MG Caps capsule Commonly known as:  FLOMAX Take 0.4 mg by mouth daily.            Durable Medical Equipment  (From admission, onward)  Start     Ordered   06/06/18 1520  For home use only DME 3 n 1  Once     06/06/18 1520   06/06/18 1520  For home use only DME Walker rolling  Once    Question:  Patient needs a walker to treat with the following condition  Answer:  Impaired mobility   06/06/18 1520         Follow-up Information    [facility] Follow up.   Specialty:  Home Health Services Why:  physical therapy, occupational therapy and registered nurse Contact information: [address] [place] [address] [phone]        Clinic, Medical, MD. Schedule an appointment as soon as possible for a visit in 1 week(s).   Why:  At [facility]          Allergies  Allergen Reactions  . Cashew Nut Oil Palpitations  . Percocet [Oxycodone-Acetaminophen] Other (See Comments)    Hallucintaions     Procedures/Studies: Dg Chest Port 1 View  Result Date: 06/04/2018 CLINICAL DATA:  Endotracheal tube placement.  Short of breath EXAM: PORTABLE CHEST 1 VIEW COMPARISON:  06/03/2018 FINDINGS: Endotracheal tube in good position. NG tube in the  stomach with the tip not visualized. Right jugular central venous catheter tip in the lower SVC unchanged. Diffuse bilateral airspace disease. Mild improved aeration since the prior study. Small bilateral effusions. No pneumothorax. IMPRESSION: Diffuse bilateral airspace disease with mild interval improvement Support lines unchanged in position. Electronically Signed   By: Marlan Palau M.D.   On: 06/04/2018 08:31   Dg Chest Port 1 View  Result Date: 06/03/2018 CLINICAL DATA:  Shortness of breath EXAM: PORTABLE CHEST 1 VIEW COMPARISON:  06/02/2018 FINDINGS: Cardiac shadow remains enlarged. Endotracheal tube, nasogastric catheter and right jugular central line are again seen and stable. Improved aeration is noted in the lungs bilaterally although persistent infiltrates are seen. No pneumothorax is noted. Small left pleural effusion is noted. No acute bony abnormality is seen. IMPRESSION: Improved aeration bilaterally. Electronically Signed   By: Alcide Clever M.D.   On: 06/03/2018 07:47   Dg Chest Port 1 View  Result Date: 06/02/2018 CLINICAL DATA:  67 year old male with central line placement. EXAM: PORTABLE CHEST 1 VIEW COMPARISON:  Earlier radiograph dated 06/01/2018 FINDINGS: Interval placement of a right IJ central line with tip in the region of the cavoatrial junction. Endotracheal tube remains above the carina and enteric tube extends below the diaphragm. Extensive bilateral airspace opacities similar or progressed since the prior radiograph. No large pleural effusion. No pneumothorax. Stable cardiomegaly. No acute osseous pathology. IMPRESSION: 1. Interval placement of a right IJ central venous line with tip in the region of the cavoatrial junction. No pneumothorax. 2. Bilateral airspace disease. Electronically Signed   By: Elgie Collard M.D.   On: 06/02/2018 00:56   Dg Chest Portable 1 View  Result Date: 06/01/2018 CLINICAL DATA:  Intubation EXAM: PORTABLE CHEST 1 VIEW COMPARISON:  06/01/2018  FINDINGS: Endotracheal tube is 3 cm above the carina. NG tube tip is in the proximal stomach with the side port near the GE junction. Bilateral severe airspace disease again noted, stable. Heart is borderline in size. No visible effusions. IMPRESSION: Diffuse bilateral airspace disease again noted, unchanged. NG tube tip in the proximal stomach with the side port near the GE junction. Electronically Signed   By: Charlett Nose M.D.   On: 06/01/2018 21:42   Dg Chest  Portable 1 View  Result Date: 06/01/2018 CLINICAL DATA:  Post intubation. EXAM: PORTABLE CHEST 1 VIEW COMPARISON:  06/01/2018 and 10/14/2017 FINDINGS: Endotracheal tube has tip 3.4 cm above the carina. Lungs are adequately inflated and demonstrate moderate bilateral airspace consolidation throughout the lungs right worse than left slightly worse. No effusion. Stable cardiomegaly. Remainder of the exam is unchanged. IMPRESSION: Moderate bilateral airspace consolidation with slight interval worsening. Stable cardiomegaly. Endotracheal tube with tip 3.4 cm above the carina. Electronically Signed   By: Elberta Fortis M.D.   On: 06/01/2018 21:36   Dg Chest Port 1 View  Result Date: 06/01/2018 CLINICAL DATA:  Severe lower extremity edema. EXAM: PORTABLE CHEST 1 VIEW COMPARISON:  October 14, 2017 FINDINGS: Cardiomegaly again identified. Bilateral pulmonary infiltrates, right greater than left. The infiltrate is more focal on the right than the left as well. No pneumothorax. No other interval changes. IMPRESSION: 1. Cardiomegaly. 2. Bilateral pulmonary infiltrates, right greater than left may represent asymmetric edema. The more focal opacity on the right could represent more focal edema versus an underlying infiltrate such as pneumonia. Recommend clinical correlation and follow-up to resolution. Electronically Signed   By: Gerome Sam III M.D   On: 06/01/2018 21:10   Dg Abd Portable 1v  Result Date: 06/02/2018 CLINICAL DATA:  Advanced feeding tube.  EXAM: PORTABLE ABDOMEN - 1 VIEW COMPARISON:  Plain film of the abdomen from earlier same day. FINDINGS: Feeding tube now appears adequately positioned in the stomach with tip directed towards the stomach antrum/pylorus. IMPRESSION: Well-positioned feeding tube. Electronically Signed   By: Bary Richard M.D.   On: 06/02/2018 16:49   Dg Abd Portable 1v  Result Date: 06/02/2018 CLINICAL DATA:  67 year old male with abdominal distention. EXAM: PORTABLE ABDOMEN - 1 VIEW COMPARISON:  Abdominal radiograph dated 10/31/2016 FINDINGS: An enteric tube is partially visualized with side-port and tip in the left upper abdomen, likely in the proximal stomach. Recommend further advancing the tube. Dilated air-filled loops of small bowel measure up to 5 cm. IMPRESSION: 1. Enteric tube with tip in the left upper abdomen, likely in the proximal stomach. Recommend further advancing the tube. 2. Dilated air-filled loops of small bowel measure up to 5 cm. Electronically Signed   By: Elgie Collard M.D.   On: 06/02/2018 00:36   Vas [place] Lower Extremity Venous (dvt)  Result Date: 06/02/2018  Lower Venous Study Indications: Edema.  Performing Technologist: Toma Deiters RVS  Examination Guidelines: A complete evaluation includes B-mode imaging, spectral Doppler, color Doppler, and power Doppler as needed of all accessible portions of each vessel. Bilateral testing is considered an integral part of a complete examination. Limited examinations for reoccurring indications may be performed as noted.  Right Venous Findings: +---------+---------------+---------+-----------+----------+-------------------+          CompressibilityPhasicitySpontaneityPropertiesSummary             +---------+---------------+---------+-----------+----------+-------------------+ CFV      Full           Yes      Yes                                      +---------+---------------+---------+-----------+----------+-------------------+ SFJ       Full                                                             +---------+---------------+---------+-----------+----------+-------------------+  FV Prox  Full           Yes      Yes                                      +---------+---------------+---------+-----------+----------+-------------------+ FV Mid   Full                                                             +---------+---------------+---------+-----------+----------+-------------------+ FV DistalFull           Yes      Yes                                      +---------+---------------+---------+-----------+----------+-------------------+ PFV      Full           Yes      Yes                                      +---------+---------------+---------+-----------+----------+-------------------+ POP      Full           Yes      Yes                                      +---------+---------------+---------+-----------+----------+-------------------+ PTV      Full                                                             +---------+---------------+---------+-----------+----------+-------------------+ PERO                                                  Difficult to image                                                        due to edema        +---------+---------------+---------+-----------+----------+-------------------+  Left Venous Findings: +---------+---------------+---------+-----------+----------+-------+          CompressibilityPhasicitySpontaneityPropertiesSummary +---------+---------------+---------+-----------+----------+-------+ CFV      Full           Yes      Yes                          +---------+---------------+---------+-----------+----------+-------+ SFJ      Full                                                 +---------+---------------+---------+-----------+----------+-------+  FV Prox  Full           Yes      Yes                           +---------+---------------+---------+-----------+----------+-------+ FV Mid   Full                                                 +---------+---------------+---------+-----------+----------+-------+ FV DistalFull           Yes      Yes                          +---------+---------------+---------+-----------+----------+-------+ PFV      Full           Yes      Yes                          +---------+---------------+---------+-----------+----------+-------+ POP      Full           Yes      Yes                          +---------+---------------+---------+-----------+----------+-------+ PTV      Full                                                 +---------+---------------+---------+-----------+----------+-------+ PERO     Full                                                 +---------+---------------+---------+-----------+----------+-------+    Summary: Right: There is no evidence of deep vein thrombosis in the lower extremity. No cystic structure found in the popliteal fossa. Left: There is no evidence of deep vein thrombosis in the lower extremity. No cystic structure found in the popliteal fossa.  *See table(s) above for measurements and observations. Electronically signed by Fabienne Bruns MD on 06/02/2018 at 5:39:22 PM.    Final     Echo Study Conclusions  - Left ventricle: The cavity size was normal. Systolic function was   mildly reduced. The estimated ejection fraction was in the range   of 45% to 50%. Wall motion was normal; there were no regional   wall motion abnormalities. Features are consistent with a   pseudonormal left ventricular filling pattern, with concomitant   abnormal relaxation and increased filling pressure (grade 2   diastolic dysfunction). - Aortic valve: Moderate focal calcification involving the   noncoronary cusp. Noncoronary cusp mobility was mildly   restricted. There was moderate regurgitation. Valve area (VTI):   2.21 cm^2.  Valve area (Vmax): 2.33 cm^2. Valve area (Vmean): 2.33   cm^2. - Mitral valve: There was mild regurgitation. - Left atrium: The atrium was mildly dilated. - Pulmonary arteries: Systolic pressure was mildly increased. PA peak pressure: 35 mm Hg (S).    Discharge Exam: Vitals:   06/08/18 0426 06/08/18 0853  BP: 134/74 131/77  Pulse: 89 81  Resp: 18 18  Temp: 97.8 F (36.6 C) 97.9 F (36.6 C)  SpO2: 94% 97%    General: Pt is alert, awake, not in acute distress Cardiovascular: RRR, S1/S2 +, no rubs, no gallops Respiratory: CTA bilaterally, no wheezing, no rhonchi Abdominal: Soft, NT, ND, bowel sounds + Extremities: no edema, no cyanosis    The results of significant diagnostics from this hospitalization (including imaging, microbiology, ancillary and laboratory) are listed below for reference.     Microbiology: Recent Results (from the past 240 hour(s))  Culture, blood (routine x 2)     Status: None   Collection Time: 06/01/18  8:45 PM  Result Value Ref Range Status   Specimen Description BLOOD LEFT ARM  Final   Special Requests   Final    BOTTLES DRAWN AEROBIC AND ANAEROBIC Blood Culture adequate volume   Culture   Final    NO GROWTH 5 DAYS Performed at [facility], [address], [place], [place] [address]    Report Status 06/06/2018 FINAL  Final  Culture, blood (routine x 2)     Status: None   Collection Time: 06/01/18  8:55 PM  Result Value Ref Range Status   Specimen Description BLOOD RIGHT HAND  Final   Special Requests   Final    BOTTLES DRAWN AEROBIC ONLY Blood Culture results may not be optimal due to an excessive volume of blood received in culture bottles   Culture   Final    NO GROWTH 5 DAYS Performed at [facility], [address] [place], [place] [address]    Report Status 06/06/2018 FINAL  Final  Respiratory Panel by PCR     Status: None   Collection Time: 06/01/18 10:41 PM  Result Value Ref Range Status   Adenovirus NOT DETECTED  NOT DETECTED Final   Coronavirus 229E NOT DETECTED NOT DETECTED Final   Coronavirus HKU1 NOT DETECTED NOT DETECTED Final   Coronavirus NL63 NOT DETECTED NOT DETECTED Final   Coronavirus OC43 NOT DETECTED NOT DETECTED Final   Metapneumovirus NOT DETECTED NOT DETECTED Final   Rhinovirus / Enterovirus NOT DETECTED NOT DETECTED Final   Influenza A NOT DETECTED NOT DETECTED Final   Influenza B NOT DETECTED NOT DETECTED Final   Parainfluenza Virus 1 NOT DETECTED NOT DETECTED Final   Parainfluenza Virus 2 NOT DETECTED NOT DETECTED Final   Parainfluenza Virus 3 NOT DETECTED NOT DETECTED Final   Parainfluenza Virus 4 NOT DETECTED NOT DETECTED Final   Respiratory Syncytial Virus NOT DETECTED NOT DETECTED Final   Bordetella pertussis NOT DETECTED NOT DETECTED Final   Chlamydophila pneumoniae NOT DETECTED NOT DETECTED Final   Mycoplasma pneumoniae NOT DETECTED NOT DETECTED Final    Comment: Performed at [facility], [address] [place], [place] [address]  Culture, respiratory (non-expectorated)     Status: None   Collection Time: 06/01/18 10:41 PM  Result Value Ref Range Status   Specimen Description TRACHEAL ASPIRATE  Final   Special Requests NONE  Final   Gram Stain   Final    RARE WBC PRESENT,BOTH PMN AND MONONUCLEAR NO ORGANISMS SEEN    Culture   Final    FEW Consistent with normal respiratory flora. Performed at [facility], [address] [place], [place] [address]    Report Status 06/04/2018 FINAL  Final  MRSA PCR Screening     Status: None   Collection Time: 06/02/18 12:15 AM  Result Value Ref Range Status  MRSA by PCR NEGATIVE NEGATIVE Final    Comment:        The GeneXpert MRSA Assay (FDA approved for NASAL specimens only), is one component of a comprehensive MRSA colonization surveillance program. It is not intended to diagnose MRSA infection nor to guide or monitor treatment for MRSA infections. Performed at [facility], [address]  [place], [place] [id number]      Labs: BNP (last 3 results) Recent Labs    10/14/17 1845 06/01/18 2100  BNP 116.6* 275.1*   Basic Metabolic Panel: Recent Labs  Lab 06/02/18 0441 06/03/18 0336 06/04/18 0430 06/05/18 0449 06/07/18 0408 06/08/18 0450  NA 138 139 142 140 140 138  K 3.5 3.9 4.1 4.2 4.6 4.9  CL 104 104 99 99 102 101  CO2 23 25 31 28 27 27   GLUCOSE 157* 163* 188* 137* 153* 175*  BUN 26* 25* 29* 29* 32* 28*  CREATININE 1.31* 1.43* 1.39* 1.26* 1.31* 1.29*  CALCIUM 7.7* 7.6* 8.3* 8.0* 8.6* 8.7*  MG 1.2* 2.1 2.0 1.8  --   --   PHOS 3.6 4.1 3.5 3.6  --   --    Liver Function Tests: Recent Labs  Lab 06/01/18 2300  AST 23  ALT 26  ALKPHOS 110  BILITOT 0.4  PROT 8.7*  ALBUMIN 3.3*   No results for input(s): LIPASE, AMYLASE in the last 168 hours. No results for input(s): AMMONIA in the last 168 hours. CBC: Recent Labs  Lab 06/01/18 2100  06/03/18 0336 06/04/18 0430 06/05/18 0449 06/07/18 0408 06/08/18 0450  WBC 16.1*   < > 9.2 11.1* 9.9 9.1 9.3  NEUTROABS 11.1*  --   --   --   --   --   --   HGB 12.1*   < > 8.4* 9.0* 9.4* 9.8* 9.9*  HCT 40.8   < > 27.8* 29.9* 30.9* 32.2* 32.0*  MCV 90.7   < > 89.1 88.2 88.0 88.2 87.9  PLT 409*   < > 228 279 294 277 280   < > = values in this interval not displayed.   Cardiac Enzymes: Recent Labs  Lab 06/02/18 0015 06/02/18 0538 06/02/18 1119 06/02/18 1800  TROPONINI 0.05* 0.07* 0.08* 0.09*   BNP: Invalid input(s): POCBNP CBG: Recent Labs  Lab 06/07/18 0724 06/07/18 1119 06/07/18 1658 06/07/18 2112 06/08/18 0728  GLUCAP 151* 227* 150* 202* 158*   D-Dimer No results for input(s): DDIMER in the last 72 hours. Hgb A1c No results for input(s): HGBA1C in the last 72 hours. Lipid Profile No results for input(s): CHOL, HDL, LDLCALC, TRIG, CHOLHDL, LDLDIRECT in the last 72 hours. Thyroid function studies No results for input(s): TSH, T4TOTAL, T3FREE, THYROIDAB in the last 72 hours.  Invalid input(s):  FREET3 Anemia work up No results for input(s): VITAMINB12, FOLATE, FERRITIN, TIBC, IRON, RETICCTPCT in the last 72 hours. Urinalysis    Component Value Date/Time   COLORURINE YELLOW 06/01/2018 2157   APPEARANCEUR CLEAR 06/01/2018 2157   LABSPEC 1.011 06/01/2018 2157   PHURINE 5.0 06/01/2018 2157   GLUCOSEU 50 (A) 06/01/2018 2157   HGBUR NEGATIVE 06/01/2018 2157   BILIRUBINUR NEGATIVE 06/01/2018 2157   KETONESUR NEGATIVE 06/01/2018 2157   PROTEINUR 100 (A) 06/01/2018 2157   UROBILINOGEN 1.0 05/04/2015 1415   NITRITE NEGATIVE 06/01/2018 2157   LEUKOCYTESUR NEGATIVE 06/01/2018 2157   Sepsis Labs Invalid input(s): PROCALCITONIN,  WBC,  LACTICIDVEN Microbiology Recent Results (from the past 240 hour(s))  Culture, blood (routine x 2)  Status: None   Collection Time: 06/01/18  8:45 PM  Result Value Ref Range Status   Specimen Description BLOOD LEFT ARM  Final   Special Requests   Final    BOTTLES DRAWN AEROBIC AND ANAEROBIC Blood Culture adequate volume   Culture   Final    NO GROWTH 5 DAYS Performed at [facility], [address] [place], [place] [address]    Report Status 06/06/2018 FINAL  Final  Culture, blood (routine x 2)     Status: None   Collection Time: 06/01/18  8:55 PM  Result Value Ref Range Status   Specimen Description BLOOD RIGHT HAND  Final   Special Requests   Final    BOTTLES DRAWN AEROBIC ONLY Blood Culture results may not be optimal due to an excessive volume of blood received in culture bottles   Culture   Final    NO GROWTH 5 DAYS Performed at [facility], [address], [place], [place] [address]    Report Status 06/06/2018 FINAL  Final  Respiratory Panel by PCR     Status: None   Collection Time: 06/01/18 10:41 PM  Result Value Ref Range Status   Adenovirus NOT DETECTED NOT DETECTED Final   Coronavirus 229E NOT DETECTED NOT DETECTED Final   Coronavirus HKU1 NOT DETECTED NOT DETECTED Final   Coronavirus NL63 NOT DETECTED NOT  DETECTED Final   Coronavirus OC43 NOT DETECTED NOT DETECTED Final   Metapneumovirus NOT DETECTED NOT DETECTED Final   Rhinovirus / Enterovirus NOT DETECTED NOT DETECTED Final   Influenza A NOT DETECTED NOT DETECTED Final   Influenza B NOT DETECTED NOT DETECTED Final   Parainfluenza Virus 1 NOT DETECTED NOT DETECTED Final   Parainfluenza Virus 2 NOT DETECTED NOT DETECTED Final   Parainfluenza Virus 3 NOT DETECTED NOT DETECTED Final   Parainfluenza Virus 4 NOT DETECTED NOT DETECTED Final   Respiratory Syncytial Virus NOT DETECTED NOT DETECTED Final   Bordetella pertussis NOT DETECTED NOT DETECTED Final   Chlamydophila pneumoniae NOT DETECTED NOT DETECTED Final   Mycoplasma pneumoniae NOT DETECTED NOT DETECTED Final    Comment: Performed at [facility], [address] [place], [place] [address]  Culture, respiratory (non-expectorated)     Status: None   Collection Time: 06/01/18 10:41 PM  Result Value Ref Range Status   Specimen Description TRACHEAL ASPIRATE  Final   Special Requests NONE  Final   Gram Stain   Final    RARE WBC PRESENT,BOTH PMN AND MONONUCLEAR NO ORGANISMS SEEN    Culture   Final    FEW Consistent with normal respiratory flora. Performed at [facility], [address] [place], [place] [address]    Report Status 06/04/2018 FINAL  Final  MRSA PCR Screening     Status: None   Collection Time: 06/02/18 12:15 AM  Result Value Ref Range Status   MRSA by PCR NEGATIVE NEGATIVE Final    Comment:        The GeneXpert MRSA Assay (FDA approved for NASAL specimens only), is one component of a comprehensive MRSA colonization surveillance program. It is not intended to diagnose MRSA infection nor to guide or monitor treatment for MRSA infections. Performed at [facility], [address], [place], [place] [address]      Patient was seen and examined on the day of discharge and was found to be in stable condition. Time coordinating discharge: 35  minutes including assessment and coordination  of care, as well as examination of the patient.   SIGNED:  Noralee Stain, DO [facility] Pager [phone]  If 7PM-7AM, please contact night-coverage [email or web address] Password [email or web address] 06/08/2018, 11:00 AM

## 2018-06-08 NOTE — Progress Notes (Signed)
Patient discharged to home. Patient AVS reviewed and signed. Patient capable re-verbalizing medications and follow-up appointments. IV removed. Patient belongings sent with patient. Patient educated to return to the ED in the event of SOB, chest pain or dizziness.   Truett Mcfarlan B. RN 

## 2018-06-08 NOTE — Progress Notes (Signed)
RW 3/1 to be delivered to room prior to DC. Notified [facility] that patient has DC order today.

## 2018-06-23 ENCOUNTER — Ambulatory Visit (HOSPITAL_COMMUNITY)
Admission: RE | Admit: 2018-06-23 | Discharge: 2018-06-23 | Disposition: A | Discharge status: Home / Self Care | Payer: Medicare Other | Source: Ambulatory Visit | Attending: Vascular Surgery | Admitting: Vascular Surgery

## 2018-06-23 DIAGNOSIS — M7989 Other specified soft tissue disorders: Secondary | ICD-10-CM | POA: Diagnosis present

## 2018-06-24 ENCOUNTER — Other Ambulatory Visit: Payer: Self-pay

## 2018-06-24 ENCOUNTER — Encounter: Payer: Self-pay | Admitting: Vascular Surgery

## 2018-06-24 ENCOUNTER — Ambulatory Visit (INDEPENDENT_AMBULATORY_CARE_PROVIDER_SITE_OTHER): Payer: Medicare Other | Admitting: Vascular Surgery

## 2018-06-24 DIAGNOSIS — I872 Venous insufficiency (chronic) (peripheral): Secondary | ICD-10-CM | POA: Diagnosis not present

## 2018-06-24 NOTE — Progress Notes (Signed)
Patient name: [name] MRN: [id number] DOB: 07/11/1950 Sex: male  REASON FOR CONSULT: Evaluate circulation of both legs  HPI: [name] is a [age] y.o. male, with history severe lower extremity leg edema, hypertension, hyperlipidemia, CHF, and DM that initially presented for evaluation of circulation in his lower extremities from his PCP. When I first evaluated him no tissue loss, rest pain, and didn't walk enough to induce claudication.  His non-invasive imaging suggested triphasic waveforms on the right which was the worse leg.  I was more concerned with his leg swelling and had recommended work-up for venous insufficiency and presents today for follow-up after getting bilateral lower extremity venous reflux study.  States he was recently hospitalized for pneumonia and has been started on two new fluid pills including Lasix and spironolactone.  His leg swelling is significantly improved.  Past Medical History:  Diagnosis Date  . 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 Diabetes mellitus 10/17/2011   newly dx today  . Edema leg    right leg has leaky valve and right foot swells  . Emphysema   . Excessive ear wax   . Fatty liver   . Hernia    near navel  . Hyperlipidemia   . Hypertension   . Leaky heart valve   . Neuropathy   . Rheumatic fever   . Slow urinary stream   . TIA (transient ischemic attack)     Past Surgical History:  Procedure Laterality Date  . CIRCUMCISION    . LITHOTRIPSY      Family History  Problem Relation Age of Onset  . Emphysema Mother   . Aneurysm Mother   . Emphysema Father   . Coronary artery disease Brother   . Coronary artery disease Brother   . Coronary artery disease Sister     SOCIAL HISTORY: Social History   Socioeconomic History  . Marital status: Married    Spouse name: Not on file  . Number of children: Not on file  . Years of education: Not on file  . Highest education level: Not  on file  Occupational History  . Not on file  Social Needs  . Financial resource strain: Not very hard  . Food insecurity:    Worry: Never true    Inability: Never true  . Transportation needs:    Medical: Patient refused    Non-medical: Patient refused  Tobacco Use  . Smoking status: Never Smoker  . Smokeless tobacco: Never Used  Substance and Sexual Activity  . Alcohol use: No  . Drug use: No  . Sexual activity: Not Currently    Partners: Female    Birth control/protection: None  Lifestyle  . Physical activity:    Days per week: 0 days    Minutes per session: 0 min  . Stress: Not at all  Relationships  . Social connections:    Talks on phone: Patient refused    Gets together: Patient refused    Attends religious service: Patient refused    Active member of club or organization: Patient refused    Attends meetings of clubs or organizations: Patient refused    Relationship status: Patient refused  . Intimate partner violence:    Fear of current or ex partner: No    Emotionally abused: No    Physically abused: No    Forced sexual activity: No  Other Topics Concern  . Not on file  Social History Narrative  Patient lives in private residence with supportive spouse    Allergies  Allergen Reactions  . Cashew Nut Oil Palpitations  . Percocet [Oxycodone-Acetaminophen] Other (See Comments)    Hallucintaions    Current Outpatient Medications  Medication Sig Dispense Refill  . albuterol (PROVENTIL HFA;VENTOLIN HFA) 108 (90 Base) MCG/ACT inhaler Inhale 2 puffs into the lungs every 6 (six) hours as needed for wheezing or shortness of breath. 1 Inhaler 2  . aspirin EC 81 MG tablet Take 81 mg by mouth daily.    Marland Kitchen atorvastatin (LIPITOR) 40 MG tablet Take 1 tablet (40 mg total) by mouth every evening. 30 tablet 0  . carvedilol (COREG) 6.25 MG tablet Take 1 tablet (6.25 mg total) by mouth 2 (two) times daily with a meal. 60 tablet 0  . cholecalciferol (VITAMIN D) 1000 units  tablet Take 1,000 Units by mouth daily.    . finasteride (PROSCAR) 5 MG tablet Take 5 mg by mouth daily.    . furosemide (LASIX) 20 MG tablet Take 1 tablet (20 mg total) by mouth daily. 30 tablet 0  . gabapentin (NEURONTIN) 300 MG capsule Take 600 mg by mouth 2 (two) times daily.     . hydrOXYzine (VISTARIL) 50 MG capsule Take 50 mg by mouth 3 (three) times daily.    . metFORMIN (GLUCOPHAGE) 1000 MG tablet Take 1,000 mg by mouth 2 (two) times daily.    . montelukast (SINGULAIR) 10 MG tablet Take 10 mg by mouth at bedtime.    . Multiple Vitamin (MULTIVITAMIN) tablet Take 1 tablet by mouth daily.    . pantoprazole (PROTONIX) 40 MG tablet Take 1 tablet (40 mg total) by mouth daily. 30 tablet 1  . spironolactone (ALDACTONE) 25 MG tablet Take 1 tablet (25 mg total) by mouth daily. 30 tablet 0  . tamsulosin (FLOMAX) 0.4 MG CAPS capsule Take 0.4 mg by mouth daily.     No current facility-administered medications for this visit.     REVIEW OF SYSTEMS:  [X]  denotes positive finding, [ ]  denotes negative finding Cardiac  Comments:  Chest pain or chest pressure:    Shortness of breath upon exertion: x   Short of breath when lying flat:    Irregular heart rhythm:        Vascular    Pain in calf, thigh, or hip brought on by ambulation:    Pain in feet at night that wakes you up from your sleep:     Blood clot in your veins:    Leg swelling:  x       Pulmonary    Oxygen at home:    Productive cough:     Wheezing:         Neurologic    Sudden weakness in arms or legs:     Sudden numbness in arms or legs:     Sudden onset of difficulty speaking or slurred speech:    Temporary loss of vision in one eye:     Problems with dizziness:         Gastrointestinal    Blood in stool:     Vomited blood:         Genitourinary    Burning when urinating:     Blood in urine:        Psychiatric    Major depression:         Hematologic    Bleeding problems:    Problems with blood clotting too  easily:  Skin    Rashes or ulcers:        Constitutional    Fever or chills:      PHYSICAL EXAM: Vitals:   06/24/18 0925  BP: 116/72  Pulse: 75  Resp: 20  Temp: (!) 97.1 F (36.2 C)  TempSrc: Oral  SpO2: 95%  Weight: 232 lb (105.2 kg)  Height: 5\' 7"  (1.702 m)    GENERAL: The patient is a well-nourished male, in no acute distress. The vital signs are documented above. 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 ABDOMEN: Soft and non-tender with normal pitched bowel sounds.  Obese. MUSCULOSKELETAL: There are no major deformities or cyanosis. NEUROLOGIC: No focal weakness or paresthesias are detected. SKIN: There are no ulcers or rashes noted. PSYCHIATRIC: The patient has a normal affect.  DATA:   No evidence of lower extremity DVT reflux study showed significant reflux in the right common femoral vein as well as the great saphenous vein.  Assessment/Plan:  67 year old male initially referred for evaluation of PAD.  On my initial evaluation his lower extremity edema and lipodermatosclerosis was much more marked and impressive.  I think his leg swelling is a combination of heart failure as well as some venous insufficiency in the right leg.  Since initial evaluation his legs look much better today since starting Lasix and spironolactone and the edema is improved.  He has no open wounds at this time and I do not see an immediate role for an Radio broadcast assistantUnna boot.  I have recommended conservative measures with leg elevation and suggested compression to the right lower extremity.  I will have him follow-up in 3 months to see Dr. Edilia Boickson or Dr. fields  to further evaluate if he would benefit from intervention on his right great saphenous vein given the significant reflux times and large vein diameter.  Cephus Shellinghristopher J. , MD [facility] Office: [phone] Pager: [phone]   Cephus Shellinghristopher J

## 2018-09-24 ENCOUNTER — Ambulatory Visit: Payer: Self-pay | Admitting: Vascular Surgery

## 2018-10-11 ENCOUNTER — Inpatient Hospital Stay (HOSPITAL_COMMUNITY)
Admission: EM | Admit: 2018-10-11 | Discharge: 2018-10-17 | DRG: 208 | Disposition: A | Discharge status: Home Health | Payer: Medicare Other | Attending: Internal Medicine | Admitting: Internal Medicine

## 2018-10-11 ENCOUNTER — Inpatient Hospital Stay (HOSPITAL_COMMUNITY): Payer: Medicare Other

## 2018-10-11 ENCOUNTER — Emergency Department (HOSPITAL_COMMUNITY): Payer: Medicare Other

## 2018-10-11 DIAGNOSIS — N183 Chronic kidney disease, stage 3 (moderate): Secondary | ICD-10-CM | POA: Diagnosis present

## 2018-10-11 DIAGNOSIS — D631 Anemia in chronic kidney disease: Secondary | ICD-10-CM | POA: Diagnosis present

## 2018-10-11 DIAGNOSIS — Z79890 Hormone replacement therapy (postmenopausal): Secondary | ICD-10-CM

## 2018-10-11 DIAGNOSIS — E119 Type 2 diabetes mellitus without complications: Secondary | ICD-10-CM

## 2018-10-11 DIAGNOSIS — Z8249 Family history of ischemic heart disease and other diseases of the circulatory system: Secondary | ICD-10-CM

## 2018-10-11 DIAGNOSIS — I5043 Acute on chronic combined systolic (congestive) and diastolic (congestive) heart failure: Secondary | ICD-10-CM | POA: Diagnosis present

## 2018-10-11 DIAGNOSIS — Z20828 Contact with and (suspected) exposure to other viral communicable diseases: Secondary | ICD-10-CM | POA: Diagnosis present

## 2018-10-11 DIAGNOSIS — I468 Cardiac arrest due to other underlying condition: Secondary | ICD-10-CM | POA: Diagnosis present

## 2018-10-11 DIAGNOSIS — I2510 Atherosclerotic heart disease of native coronary artery without angina pectoris: Secondary | ICD-10-CM | POA: Diagnosis present

## 2018-10-11 DIAGNOSIS — E785 Hyperlipidemia, unspecified: Secondary | ICD-10-CM | POA: Diagnosis present

## 2018-10-11 DIAGNOSIS — M21372 Foot drop, left foot: Secondary | ICD-10-CM | POA: Diagnosis present

## 2018-10-11 DIAGNOSIS — E1122 Type 2 diabetes mellitus with diabetic chronic kidney disease: Secondary | ICD-10-CM | POA: Diagnosis present

## 2018-10-11 DIAGNOSIS — I447 Left bundle-branch block, unspecified: Secondary | ICD-10-CM | POA: Diagnosis present

## 2018-10-11 DIAGNOSIS — I351 Nonrheumatic aortic (valve) insufficiency: Secondary | ICD-10-CM | POA: Diagnosis present

## 2018-10-11 DIAGNOSIS — Z91018 Allergy to other foods: Secondary | ICD-10-CM

## 2018-10-11 DIAGNOSIS — J80 Acute respiratory distress syndrome: Secondary | ICD-10-CM

## 2018-10-11 DIAGNOSIS — N400 Enlarged prostate without lower urinary tract symptoms: Secondary | ICD-10-CM | POA: Diagnosis present

## 2018-10-11 DIAGNOSIS — E876 Hypokalemia: Secondary | ICD-10-CM | POA: Diagnosis present

## 2018-10-11 DIAGNOSIS — D649 Anemia, unspecified: Secondary | ICD-10-CM | POA: Diagnosis not present

## 2018-10-11 DIAGNOSIS — Z781 Physical restraint status: Secondary | ICD-10-CM

## 2018-10-11 DIAGNOSIS — Z8673 Personal history of transient ischemic attack (TIA), and cerebral infarction without residual deficits: Secondary | ICD-10-CM

## 2018-10-11 DIAGNOSIS — Z825 Family history of asthma and other chronic lower respiratory diseases: Secondary | ICD-10-CM

## 2018-10-11 DIAGNOSIS — R092 Respiratory arrest: Secondary | ICD-10-CM

## 2018-10-11 DIAGNOSIS — Z7982 Long term (current) use of aspirin: Secondary | ICD-10-CM

## 2018-10-11 DIAGNOSIS — J9601 Acute respiratory failure with hypoxia: Principal | ICD-10-CM

## 2018-10-11 DIAGNOSIS — J189 Pneumonia, unspecified organism: Secondary | ICD-10-CM | POA: Diagnosis present

## 2018-10-11 DIAGNOSIS — Z452 Encounter for adjustment and management of vascular access device: Secondary | ICD-10-CM

## 2018-10-11 DIAGNOSIS — N179 Acute kidney failure, unspecified: Secondary | ICD-10-CM | POA: Diagnosis present

## 2018-10-11 DIAGNOSIS — E1151 Type 2 diabetes mellitus with diabetic peripheral angiopathy without gangrene: Secondary | ICD-10-CM | POA: Diagnosis present

## 2018-10-11 DIAGNOSIS — I5021 Acute systolic (congestive) heart failure: Secondary | ICD-10-CM | POA: Diagnosis not present

## 2018-10-11 DIAGNOSIS — E8770 Fluid overload, unspecified: Secondary | ICD-10-CM | POA: Diagnosis not present

## 2018-10-11 DIAGNOSIS — Z9911 Dependence on respirator [ventilator] status: Secondary | ICD-10-CM | POA: Diagnosis not present

## 2018-10-11 DIAGNOSIS — Z7983 Long term (current) use of bisphosphonates: Secondary | ICD-10-CM

## 2018-10-11 DIAGNOSIS — I469 Cardiac arrest, cause unspecified: Secondary | ICD-10-CM

## 2018-10-11 DIAGNOSIS — I5033 Acute on chronic diastolic (congestive) heart failure: Secondary | ICD-10-CM | POA: Diagnosis not present

## 2018-10-11 DIAGNOSIS — K760 Fatty (change of) liver, not elsewhere classified: Secondary | ICD-10-CM | POA: Diagnosis present

## 2018-10-11 DIAGNOSIS — I509 Heart failure, unspecified: Secondary | ICD-10-CM

## 2018-10-11 DIAGNOSIS — J810 Acute pulmonary edema: Secondary | ICD-10-CM | POA: Diagnosis not present

## 2018-10-11 DIAGNOSIS — Z79899 Other long term (current) drug therapy: Secondary | ICD-10-CM

## 2018-10-11 DIAGNOSIS — Z6836 Body mass index (BMI) 36.0-36.9, adult: Secondary | ICD-10-CM

## 2018-10-11 DIAGNOSIS — I77810 Thoracic aortic ectasia: Secondary | ICD-10-CM | POA: Diagnosis present

## 2018-10-11 DIAGNOSIS — I130 Hypertensive heart and chronic kidney disease with heart failure and stage 1 through stage 4 chronic kidney disease, or unspecified chronic kidney disease: Secondary | ICD-10-CM | POA: Diagnosis present

## 2018-10-11 DIAGNOSIS — Z4659 Encounter for fitting and adjustment of other gastrointestinal appliance and device: Secondary | ICD-10-CM

## 2018-10-11 DIAGNOSIS — Z7984 Long term (current) use of oral hypoglycemic drugs: Secondary | ICD-10-CM

## 2018-10-11 DIAGNOSIS — Z885 Allergy status to narcotic agent status: Secondary | ICD-10-CM

## 2018-10-11 DIAGNOSIS — I5041 Acute combined systolic (congestive) and diastolic (congestive) heart failure: Secondary | ICD-10-CM | POA: Diagnosis not present

## 2018-10-11 DIAGNOSIS — R600 Localized edema: Secondary | ICD-10-CM | POA: Diagnosis not present

## 2018-10-11 LAB — PROTIME-INR
INR: 1.1 (ref 0.8–1.2)
Prothrombin Time: 13.8 seconds (ref 11.4–15.2)

## 2018-10-11 LAB — POCT I-STAT 7, (LYTES, BLD GAS, ICA,H+H)
Acid-Base Excess: 1 mmol/L (ref 0.0–2.0)
Acid-base deficit: 4 mmol/L — ABNORMAL HIGH (ref 0.0–2.0)
Bicarbonate: 23.9 mmol/L (ref 20.0–28.0)
Bicarbonate: 26.6 mmol/L (ref 20.0–28.0)
Calcium, Ion: 1.16 mmol/L (ref 1.15–1.40)
Calcium, Ion: 1.12 mmol/L — ABNORMAL LOW (ref 1.15–1.40)
HCT: 33 % — ABNORMAL LOW (ref 39.0–52.0)
HCT: 31 % — ABNORMAL LOW (ref 39.0–52.0)
Hemoglobin: 11.2 g/dL — ABNORMAL LOW (ref 13.0–17.0)
Hemoglobin: 10.5 g/dL — ABNORMAL LOW (ref 13.0–17.0)
O2 Saturation: 90 %
O2 Saturation: 100 %
Patient temperature: 96.6
Patient temperature: 96.6
Potassium: 4.4 mmol/L (ref 3.5–5.1)
Potassium: 4.5 mmol/L (ref 3.5–5.1)
Sodium: 138 mmol/L (ref 135–145)
Sodium: 139 mmol/L (ref 135–145)
TCO2: 25 mmol/L (ref 22–32)
TCO2: 28 mmol/L (ref 22–32)
pCO2 arterial: 50.3 mmHg — ABNORMAL HIGH (ref 32.0–48.0)
pCO2 arterial: 42.3 mmHg (ref 32.0–48.0)
pH, Arterial: 7.279 — ABNORMAL LOW (ref 7.350–7.450)
pH, Arterial: 7.402 (ref 7.350–7.450)
pO2, Arterial: 63 mmHg — ABNORMAL LOW (ref 83.0–108.0)
pO2, Arterial: 201 mmHg — ABNORMAL HIGH (ref 83.0–108.0)

## 2018-10-11 LAB — COMPREHENSIVE METABOLIC PANEL
ALT: 35 U/L (ref 0–44)
AST: 32 U/L (ref 15–41)
Albumin: 3.3 g/dL — ABNORMAL LOW (ref 3.5–5.0)
Alkaline Phosphatase: 117 U/L (ref 38–126)
Anion gap: 16 — ABNORMAL HIGH (ref 5–15)
BUN: 22 mg/dL (ref 8–23)
CO2: 22 mmol/L (ref 22–32)
Calcium: 8.7 mg/dL — ABNORMAL LOW (ref 8.9–10.3)
Chloride: 101 mmol/L (ref 98–111)
Creatinine, Ser: 1.49 mg/dL — ABNORMAL HIGH (ref 0.61–1.24)
GFR calc Af Amer: 55 mL/min — ABNORMAL LOW (ref >60)
GFR calc non Af Amer: 48 mL/min — ABNORMAL LOW (ref >60)
Glucose, Bld: 335 mg/dL — ABNORMAL HIGH (ref 70–99)
Potassium: 4.3 mmol/L (ref 3.5–5.1)
Sodium: 139 mmol/L (ref 135–145)
Total Bilirubin: 0.4 mg/dL (ref 0.3–1.2)
Total Protein: 7.3 g/dL (ref 6.5–8.1)

## 2018-10-11 LAB — RESPIRATORY PANEL BY PCR

## 2018-10-11 LAB — GLUCOSE, CAPILLARY
Glucose-Capillary: 214 mg/dL — ABNORMAL HIGH (ref 70–99)
Glucose-Capillary: 202 mg/dL — ABNORMAL HIGH (ref 70–99)
Glucose-Capillary: 144 mg/dL — ABNORMAL HIGH (ref 70–99)

## 2018-10-11 LAB — CBC WITH DIFFERENTIAL/PLATELET
Abs Immature Granulocytes: 0.72 10*3/uL — ABNORMAL HIGH (ref 0.00–0.07)
Basophils Absolute: 0.2 10*3/uL — ABNORMAL HIGH (ref 0.0–0.1)
Basophils Relative: 1 %
Eosinophils Absolute: 0.6 10*3/uL — ABNORMAL HIGH (ref 0.0–0.5)
Eosinophils Relative: 3 %
HCT: 39.2 % (ref 39.0–52.0)
Hemoglobin: 11.4 g/dL — ABNORMAL LOW (ref 13.0–17.0)
Immature Granulocytes: 4 %
Lymphocytes Relative: 26 %
Lymphs Abs: 4.9 10*3/uL — ABNORMAL HIGH (ref 0.7–4.0)
MCH: 26.8 pg (ref 26.0–34.0)
MCHC: 29.1 g/dL — ABNORMAL LOW (ref 30.0–36.0)
MCV: 92.2 fL (ref 80.0–100.0)
Monocytes Absolute: 0.7 10*3/uL (ref 0.1–1.0)
Monocytes Relative: 4 %
Neutro Abs: 11.4 10*3/uL — ABNORMAL HIGH (ref 1.7–7.7)
Neutrophils Relative %: 62 %
Platelets: 369 10*3/uL (ref 150–400)
RBC: 4.25 MIL/uL (ref 4.22–5.81)
RDW: 15.3 % (ref 11.5–15.5)
WBC: 18.5 10*3/uL — ABNORMAL HIGH (ref 4.0–10.5)
nRBC: 0.2 % (ref 0.0–0.2)

## 2018-10-11 LAB — ETHANOL: Alcohol, Ethyl (B): <10 mg/dL (ref <10)

## 2018-10-11 LAB — LACTIC ACID, PLASMA
Lactic Acid, Venous: 5.1 mmol/L (ref 0.5–1.9)
Lactic Acid, Venous: 1.3 mmol/L (ref 0.5–1.9)

## 2018-10-11 LAB — TROPONIN I: Troponin I: 0.03 ng/mL (ref <0.03)

## 2018-10-11 LAB — MAGNESIUM
Magnesium: 1.3 mg/dL — ABNORMAL LOW (ref 1.7–2.4)
Magnesium: 1.3 mg/dL — ABNORMAL LOW (ref 1.7–2.4)

## 2018-10-11 LAB — BASIC METABOLIC PANEL
Anion gap: 11 (ref 5–15)
BUN: 22 mg/dL (ref 8–23)
CO2: 25 mmol/L (ref 22–32)
Calcium: 8.3 mg/dL — ABNORMAL LOW (ref 8.9–10.3)
Chloride: 103 mmol/L (ref 98–111)
Creatinine, Ser: 1.14 mg/dL (ref 0.61–1.24)
GFR calc Af Amer: >60 mL/min (ref >60)
GFR calc non Af Amer: >60 mL/min (ref >60)
Glucose, Bld: 202 mg/dL — ABNORMAL HIGH (ref 70–99)
Potassium: 4.6 mmol/L (ref 3.5–5.1)
Sodium: 139 mmol/L (ref 135–145)

## 2018-10-11 LAB — MRSA PCR SCREENING: MRSA by PCR: NEGATIVE

## 2018-10-11 LAB — BRAIN NATRIURETIC PEPTIDE: B Natriuretic Peptide: 342.4 pg/mL — ABNORMAL HIGH (ref 0.0–100.0)

## 2018-10-11 LAB — PHOSPHORUS
Phosphorus: 3.6 mg/dL (ref 2.5–4.6)
Phosphorus: 3.7 mg/dL (ref 2.5–4.6)

## 2018-10-11 LAB — TRIGLYCERIDES: Triglycerides: 144 mg/dL (ref <150)

## 2018-10-11 LAB — CBG MONITORING, ED: Glucose-Capillary: 322 mg/dL — ABNORMAL HIGH (ref 70–99)

## 2018-10-11 MED ORDER — ASPIRIN 81 MG PO CHEW
81.0000 mg | CHEWABLE_TABLET | Freq: Every day | ORAL | Status: DC
  Administered 2018-10-11 – 2018-10-17 (×7): 81 mg via ORAL
  Filled 2018-10-11 (×7): qty 1

## 2018-10-11 MED ORDER — ORAL CARE MOUTH RINSE
15.0000 mL | OROMUCOSAL | Status: DC
  Administered 2018-10-11 – 2018-10-14 (×24): 15 mL via OROMUCOSAL

## 2018-10-11 MED ORDER — ENOXAPARIN SODIUM 40 MG/0.4ML ~~LOC~~ SOLN
40.0000 mg | SUBCUTANEOUS | Status: DC
  Administered 2018-10-11 – 2018-10-15 (×5): 40 mg via SUBCUTANEOUS
  Filled 2018-10-11 (×5): qty 0.4

## 2018-10-11 MED ORDER — FENTANYL CITRATE (PF) 100 MCG/2ML IJ SOLN
INTRAMUSCULAR | Status: AC
  Administered 2018-10-11: 08:00:00
  Filled 2018-10-11: qty 2

## 2018-10-11 MED ORDER — ATORVASTATIN CALCIUM 40 MG PO TABS
40.0000 mg | ORAL_TABLET | Freq: Every evening | ORAL | Status: DC
  Administered 2018-10-11 – 2018-10-16 (×6): 40 mg
  Filled 2018-10-11 (×6): qty 1

## 2018-10-11 MED ORDER — SODIUM CHLORIDE 0.9 % IV SOLN
2.0000 g | INTRAVENOUS | Status: DC
  Administered 2018-10-11: 2 g via INTRAVENOUS
  Filled 2018-10-11 (×2): qty 20

## 2018-10-11 MED ORDER — MAGNESIUM SULFATE 2 GM/50ML IV SOLN
2.0000 g | Freq: Once | INTRAVENOUS | Status: AC
  Administered 2018-10-11: 2 g via INTRAVENOUS
  Filled 2018-10-11: qty 50

## 2018-10-11 MED ORDER — MIDAZOLAM HCL 2 MG/2ML IJ SOLN
1.0000 mg | INTRAMUSCULAR | Status: DC | PRN
  Administered 2018-10-11: 1 mg via INTRAVENOUS

## 2018-10-11 MED ORDER — FENTANYL CITRATE (PF) 100 MCG/2ML IJ SOLN
50.0000 ug | INTRAMUSCULAR | Status: DC | PRN
  Administered 2018-10-12 (×2): 50 ug via INTRAVENOUS
  Filled 2018-10-11 (×2): qty 2

## 2018-10-11 MED ORDER — FENTANYL CITRATE (PF) 100 MCG/2ML IJ SOLN: 50.0000 ug | INTRAMUSCULAR | Status: DC | PRN

## 2018-10-11 MED ORDER — MIDAZOLAM HCL 2 MG/2ML IJ SOLN
1.0000 mg | INTRAMUSCULAR | Status: DC | PRN
  Administered 2018-10-12: 1 mg via INTRAVENOUS
  Filled 2018-10-11: qty 2

## 2018-10-11 MED ORDER — MIDAZOLAM HCL 2 MG/2ML IJ SOLN
1.0000 mg | INTRAMUSCULAR | Status: DC | PRN
  Filled 2018-10-11: qty 2

## 2018-10-11 MED ORDER — ADULT MULTIVITAMIN W/MINERALS CH
1.0000 | ORAL_TABLET | Freq: Every day | ORAL | Status: DC
  Administered 2018-10-11 – 2018-10-16 (×6): 1
  Filled 2018-10-11 (×6): qty 1

## 2018-10-11 MED ORDER — CARVEDILOL 6.25 MG PO TABS
6.2500 mg | ORAL_TABLET | Freq: Two times a day (BID) | ORAL | Status: DC
  Administered 2018-10-11 – 2018-10-12 (×3): 6.25 mg
  Filled 2018-10-11 (×5): qty 1

## 2018-10-11 MED ORDER — MIDAZOLAM HCL 2 MG/2ML IJ SOLN: 1.0000 mg | INTRAMUSCULAR | Status: DC | PRN

## 2018-10-11 MED ORDER — ONDANSETRON HCL 4 MG/2ML IJ SOLN: 4.0000 mg | Freq: Four times a day (QID) | INTRAMUSCULAR | Status: DC | PRN

## 2018-10-11 MED ORDER — TAMSULOSIN HCL 0.4 MG PO CAPS
0.4000 mg | ORAL_CAPSULE | Freq: Every day | ORAL | Status: DC
  Administered 2018-10-11 – 2018-10-17 (×7): 0.4 mg via ORAL
  Filled 2018-10-11 (×7): qty 1

## 2018-10-11 MED ORDER — VANCOMYCIN HCL 10 G IV SOLR
1500.0000 mg | Freq: Once | INTRAVENOUS | Status: DC
  Filled 2018-10-11: qty 1500

## 2018-10-11 MED ORDER — CHLORHEXIDINE GLUCONATE 0.12% ORAL RINSE (MEDLINE KIT)
15.0000 mL | Freq: Two times a day (BID) | OROMUCOSAL | Status: DC
  Administered 2018-10-11 – 2018-10-14 (×6): 15 mL via OROMUCOSAL

## 2018-10-11 MED ORDER — SODIUM CHLORIDE 0.9 % IV SOLN
2.0000 g | INTRAVENOUS | Status: AC
  Administered 2018-10-12 – 2018-10-16 (×5): 2 g via INTRAVENOUS
  Filled 2018-10-11 (×5): qty 20

## 2018-10-11 MED ORDER — FUROSEMIDE 40 MG PO TABS
20.0000 mg | ORAL_TABLET | Freq: Every day | ORAL | Status: DC
  Administered 2018-10-11 – 2018-10-12 (×2): 20 mg
  Filled 2018-10-11 (×2): qty 1

## 2018-10-11 MED ORDER — SODIUM CHLORIDE 0.9 % IV SOLN
500.0000 mg | INTRAVENOUS | Status: DC
  Administered 2018-10-11: 500 mg via INTRAVENOUS
  Filled 2018-10-11 (×2): qty 500

## 2018-10-11 MED ORDER — PROPOFOL 1000 MG/100ML IV EMUL
0.0000 ug/kg/min | INTRAVENOUS | Status: DC
  Administered 2018-10-11: 30 ug/kg/min via INTRAVENOUS
  Administered 2018-10-11: 25 ug/kg/min via INTRAVENOUS
  Administered 2018-10-11: 10 ug/kg/min via INTRAVENOUS
  Administered 2018-10-12: 20 ug/kg/min via INTRAVENOUS
  Administered 2018-10-12: 35 ug/kg/min via INTRAVENOUS
  Administered 2018-10-13: 06:00:00 25 ug/kg/min via INTRAVENOUS
  Administered 2018-10-13 (×2): 15 ug/kg/min via INTRAVENOUS
  Administered 2018-10-13: 02:00:00 20 ug/kg/min via INTRAVENOUS
  Administered 2018-10-14 (×2): 15 ug/kg/min via INTRAVENOUS
  Filled 2018-10-11 (×12): qty 100

## 2018-10-11 MED ORDER — SPIRONOLACTONE 25 MG PO TABS
25.0000 mg | ORAL_TABLET | Freq: Every day | ORAL | Status: DC
  Administered 2018-10-11 – 2018-10-16 (×6): 25 mg
  Filled 2018-10-11 (×7): qty 1

## 2018-10-11 MED ORDER — ALBUTEROL SULFATE HFA 108 (90 BASE) MCG/ACT IN AERS
2.0000 | INHALATION_SPRAY | Freq: Four times a day (QID) | RESPIRATORY_TRACT | Status: DC | PRN
  Filled 2018-10-11: qty 6.7

## 2018-10-11 MED ORDER — PANTOPRAZOLE SODIUM 40 MG PO PACK
40.0000 mg | PACK | Freq: Every day | ORAL | Status: DC
  Administered 2018-10-11 – 2018-10-12 (×2): 40 mg
  Filled 2018-10-11 (×3): qty 20

## 2018-10-11 MED ORDER — VANCOMYCIN HCL 10 G IV SOLR
1250.0000 mg | INTRAVENOUS | Status: DC
  Filled 2018-10-11 (×4): qty 1250

## 2018-10-11 MED ORDER — SODIUM CHLORIDE 0.9 % IV SOLN
2.0000 g | INTRAVENOUS | Status: DC
  Filled 2018-10-11: qty 20

## 2018-10-11 MED ORDER — MONTELUKAST SODIUM 10 MG PO TABS
10.0000 mg | ORAL_TABLET | Freq: Every day | ORAL | Status: DC
  Administered 2018-10-12 – 2018-10-16 (×6): 10 mg
  Filled 2018-10-11 (×9): qty 1

## 2018-10-11 MED ORDER — PANTOPRAZOLE SODIUM 40 MG PO TBEC: 40.0000 mg | DELAYED_RELEASE_TABLET | Freq: Every day | ORAL | Status: DC

## 2018-10-11 MED ORDER — VITAL HIGH PROTEIN PO LIQD: 1000.0000 mL | ORAL | Status: DC

## 2018-10-11 MED ORDER — IRBESARTAN 75 MG PO TABS
75.0000 mg | ORAL_TABLET | Freq: Every day | ORAL | Status: DC
  Administered 2018-10-11 – 2018-10-13 (×3): 75 mg
  Filled 2018-10-11 (×3): qty 1

## 2018-10-11 MED ORDER — PRO-STAT SUGAR FREE PO LIQD
30.0000 mL | Freq: Two times a day (BID) | ORAL | Status: DC
  Administered 2018-10-11: 30 mL
  Filled 2018-10-11: qty 30

## 2018-10-11 MED ORDER — SODIUM CHLORIDE 0.9 % IV SOLN
500.0000 mg | INTRAVENOUS | Status: DC
  Administered 2018-10-12 – 2018-10-13 (×2): 500 mg via INTRAVENOUS
  Filled 2018-10-11 (×3): qty 500

## 2018-10-11 MED ORDER — VITAL HIGH PROTEIN PO LIQD
1000.0000 mL | ORAL | Status: DC
  Administered 2018-10-11 – 2018-10-14 (×4): 1000 mL

## 2018-10-11 MED ORDER — FENTANYL CITRATE (PF) 100 MCG/2ML IJ SOLN
INTRAMUSCULAR | Status: AC
  Filled 2018-10-11: qty 2

## 2018-10-11 MED ORDER — FENTANYL CITRATE (PF) 100 MCG/2ML IJ SOLN
50.0000 ug | INTRAMUSCULAR | Status: AC | PRN
  Administered 2018-10-11 (×3): 50 ug via INTRAVENOUS
  Filled 2018-10-11 (×2): qty 2

## 2018-10-11 MED ORDER — FINASTERIDE 5 MG PO TABS
5.0000 mg | ORAL_TABLET | Freq: Every day | ORAL | Status: DC
  Administered 2018-10-11 – 2018-10-17 (×7): 5 mg via ORAL
  Filled 2018-10-11 (×7): qty 1

## 2018-10-11 MED ORDER — FENTANYL CITRATE (PF) 100 MCG/2ML IJ SOLN
50.0000 ug | INTRAMUSCULAR | Status: DC | PRN
  Administered 2018-10-11 – 2018-10-12 (×2): 50 ug via INTRAVENOUS
  Filled 2018-10-11 (×2): qty 2

## 2018-10-11 MED ORDER — GABAPENTIN 300 MG PO CAPS
300.0000 mg | ORAL_CAPSULE | Freq: Four times a day (QID) | ORAL | Status: DC
  Administered 2018-10-11 – 2018-10-13 (×8): 300 mg via ORAL
  Filled 2018-10-11 (×11): qty 1

## 2018-10-11 MED ORDER — PRO-STAT SUGAR FREE PO LIQD
60.0000 mL | Freq: Three times a day (TID) | ORAL | Status: DC
  Administered 2018-10-11 – 2018-10-13 (×8): 60 mL
  Filled 2018-10-11 (×7): qty 60

## 2018-10-11 MED ORDER — INSULIN ASPART 100 UNIT/ML ~~LOC~~ SOLN
0.0000 [IU] | SUBCUTANEOUS | Status: DC
  Administered 2018-10-11 (×2): 5 [IU] via SUBCUTANEOUS
  Administered 2018-10-11: 21:00:00 2 [IU] via SUBCUTANEOUS
  Administered 2018-10-12 (×3): 3 [IU] via SUBCUTANEOUS
  Administered 2018-10-12: 2 [IU] via SUBCUTANEOUS
  Administered 2018-10-12 – 2018-10-13 (×4): 3 [IU] via SUBCUTANEOUS

## 2018-10-11 NOTE — Progress Notes (Signed)
Foley catheter attempt failed, Dr. Tonia Brooms notified.  Verbal order for coude catheter placement

## 2018-10-11 NOTE — ED Notes (Signed)
ED TO INPATIENT HANDOFF REPORT  ED Nurse Name and Phone #:  [name], [phone]  S Name/Age/Gender [name] 68 y.o. male Room/Bed: 023C/023C  Code Status   Code Status: Prior  Home/SNF/Other Rehab Patient oriented to: self, place and situation Is this baseline? Yes      Chief Complaint SOB  Triage Note No notes on file   Allergies Allergies  Allergen Reactions  . Cashew Nut Oil Palpitations  . Percocet [Oxycodone-Acetaminophen] Other (See Comments)    Hallucintaions    Level of Care/Admitting Diagnosis ED Disposition    ED Disposition Condition Comment   Admit  [facility] [[id number]]  Level of Care: ICU [6]  Diagnosis: Acute respiratory failure with hypoxemia [facility](HCC) [[id number]]) [[id number]]  Admitting Physician: Lupita LeashMCQUAID, DOUGLAS B [[id number]]  Attending Physician: Max FickleMCQUAID, DOUGLAS B [[id number]]  Estimated length of stay: 3 - 4 days  Certification:: I certify this patient will need inpatient services for at least 2 midnights  Bed request comments: high risk covid  PT Class (Do Not Modify): Inpatient [101]  PT Acc Code (Do Not Modify): Private [1]       B Medical/Surgery History Past Medical History:  Diagnosis Date  . 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 Diabetes mellitus 10/17/2011   newly dx today  . Edema leg    right leg has leaky valve and right foot swells  . Emphysema   . Excessive ear wax   . Fatty liver   . Hernia    near navel  . Hyperlipidemia   . Hypertension   . Leaky heart valve   . Neuropathy   . Rheumatic fever   . Slow urinary stream   . TIA (transient ischemic attack)    Past Surgical History:  Procedure Laterality Date  . CIRCUMCISION    . LITHOTRIPSY       A IV Location/Drains/Wounds Patient Lines/Drains/Airways Status   Active Line/Drains/Airways    Name:   Placement date:   Placement time:   Site:   Days:   Peripheral IV 06/01/18 Right Forearm   06/01/18    2107     Forearm   132   Peripheral IV 06/01/18 Left Hand   06/01/18    2150    Hand   132   Peripheral IV 10/11/18 Right Hand   10/11/18    0829    Hand   less than 1   Peripheral IV 10/11/18 Right Hand   10/11/18    0732    Hand   less than 1   Urethral Catheter Erik Obeykeith Anderson    10/11/18    [id number]    -   less than 1   Airway 8 mm   10/11/18    0740     less than 1   Airway 8 mm   10/11/18    0840     less than 1          Intake/Output Last 24 hours  Intake/Output Summary (Last 24 hours) at 10/11/2018 0957 Last data filed at 10/11/2018 [id number] Gross per 24 hour  Intake 100 ml  Output -  Net 100 ml    Labs/Imaging Results for orders placed or performed during the hospital encounter of 10/11/18 (from the past 48 hour(s))  Lactic acid, plasma     Status: Abnormal   Collection Time: 10/11/18  7:50 AM  Result Value Ref Range   Lactic Acid, Venous 5.1 (  HH) 0.5 - 1.9 mmol/L    Comment: CRITICAL RESULT CALLED TO, READ BACK BY AND VERIFIED WITHMarcia Brash [phone] [facility] Performed at [facility], [address], [place], [place] [address]   Comprehensive metabolic panel     Status: Abnormal   Collection Time: 10/11/18  7:50 AM  Result Value Ref Range   Sodium 139 135 - 145 mmol/L   Potassium 4.3 3.5 - 5.1 mmol/L   Chloride 101 98 - 111 mmol/L   CO2 22 22 - 32 mmol/L   Glucose, Bld 335 (H) 70 - 99 mg/dL   BUN 22 8 - 23 mg/dL   Creatinine, Ser 5.99 (H) 0.61 - 1.24 mg/dL   Calcium 8.7 (L) 8.9 - 10.3 mg/dL   Total Protein 7.3 6.5 - 8.1 g/dL   Albumin 3.3 (L) 3.5 - 5.0 g/dL   AST 32 15 - 41 U/L   ALT 35 0 - 44 U/L   Alkaline Phosphatase 117 38 - 126 U/L   Total Bilirubin 0.4 0.3 - 1.2 mg/dL   GFR calc non Af Amer 48 (L) >60 mL/min   GFR calc Af Amer 55 (L) >60 mL/min   Anion gap 16 (H) 5 - 15    Comment: Performed at [facility], [address] [place], [place] [address]  CBC WITH DIFFERENTIAL     Status: Abnormal   Collection Time: 10/11/18  7:50 AM  Result Value  Ref Range   WBC 18.5 (H) 4.0 - 10.5 K/uL   RBC 4.25 4.22 - 5.81 MIL/uL   Hemoglobin 11.4 (L) 13.0 - 17.0 g/dL   HCT 77.9 39.0 - 30.0 %   MCV 92.2 80.0 - 100.0 fL   MCH 26.8 26.0 - 34.0 pg   MCHC 29.1 (L) 30.0 - 36.0 g/dL   RDW 92.3 30.0 - 76.2 %   Platelets 369 150 - 400 K/uL   nRBC 0.2 0.0 - 0.2 %   Neutrophils Relative % 62 %   Neutro Abs 11.4 (H) 1.7 - 7.7 K/uL   Lymphocytes Relative 26 %   Lymphs Abs 4.9 (H) 0.7 - 4.0 K/uL   Monocytes Relative 4 %   Monocytes Absolute 0.7 0.1 - 1.0 K/uL   Eosinophils Relative 3 %   Eosinophils Absolute 0.6 (H) 0.0 - 0.5 K/uL   Basophils Relative 1 %   Basophils Absolute 0.2 (H) 0.0 - 0.1 K/uL   Immature Granulocytes 4 %   Abs Immature Granulocytes 0.72 (H) 0.00 - 0.07 K/uL    Comment: Performed at [facility], [address] [place], [place] [address]  Protime-INR     Status: None   Collection Time: 10/11/18  7:50 AM  Result Value Ref Range   Prothrombin Time 13.8 11.4 - 15.2 seconds   INR 1.1 0.8 - 1.2    Comment: (NOTE) INR goal varies based on device and disease states. Performed at [facility], [address] [place], [place] [address]   Troponin I - Once     Status: Abnormal   Collection Time: 10/11/18  7:50 AM  Result Value Ref Range   Troponin I 0.03 (HH) <0.03 ng/mL    Comment: CRITICAL RESULT CALLED TO, READ BACK BY AND VERIFIED WITH: Sharol Given [phone] [id number] [facility] Performed at [facility], [address] [place], [place] [address]   Brain natriuretic peptide     Status: Abnormal   Collection Time: 10/11/18  7:55 AM  Result  Value Ref Range   B Natriuretic Peptide 342.4 (H) 0.0 - 100.0 pg/mL    Comment: Performed at [facility], [address] [place], [place] [address]  CBG monitoring, ED     Status: Abnormal   Collection Time: 10/11/18  8:09 AM  Result Value Ref Range   Glucose-Capillary 322 (H) 70 - 99 mg/dL  I-STAT 7, (LYTES, BLD GAS, ICA, H+H)     Status: Abnormal   Collection  Time: 10/11/18  8:44 AM  Result Value Ref Range   pH, Arterial 7.279 (L) 7.350 - 7.450   pCO2 arterial 50.3 (H) 32.0 - 48.0 mmHg   pO2, Arterial 63.0 (L) 83.0 - 108.0 mmHg   Bicarbonate 23.9 20.0 - 28.0 mmol/L   TCO2 25 22 - 32 mmol/L   O2 Saturation 90.0 %   Acid-base deficit 4.0 (H) 0.0 - 2.0 mmol/L   Sodium 138 135 - 145 mmol/L   Potassium 4.4 3.5 - 5.1 mmol/L   Calcium, Ion 1.16 1.15 - 1.40 mmol/L   HCT 33.0 (L) 39.0 - 52.0 %   Hemoglobin 11.2 (L) 13.0 - 17.0 g/dL   Patient temperature 84.1 F    Collection site RADIAL, ALLEN'S TEST ACCEPTABLE    Drawn by RT    Sample type ARTERIAL    Dg Chest Portable 1 View  Result Date: 10/11/2018 CLINICAL DATA:  Respiratory difficulty EXAM: PORTABLE CHEST 1 VIEW COMPARISON:  06/04/2018 FINDINGS: Endotracheal tube is in place. Tip is 1.9 cm from the carina. There is extensive consolidation throughout the right lung with relative sparing at the right apex and right base. There is patchy airspace disease throughout the left lung. The heart is upper normal in size. Lungs are very under aerated. Vasculature is indistinct. IMPRESSION: Endotracheal tube as described Extensive bilateral airspace disease. Electronically Signed   By: Jolaine Click M.D.   On: 10/11/2018 09:04    Pending Labs Unresulted Labs (From admission, onward)    Start     Ordered   10/11/18 0824  MRSA PCR Screening  Once,   R     10/11/18 0823   10/11/18 0802  Urine rapid drug screen (hosp performed)  ONCE - STAT,   STAT     10/11/18 0801   10/11/18 0802  Ethanol  Once,   STAT     10/11/18 0801   10/11/18 0755  Novel Coronavirus, NAA (hospital order; send-out to ref lab)  (Novel Coronavirus, NAA [facility] Order; send-out to ref lab) with precautions panel)  ONCE - STAT,   R    Question Answer Comment  Current symptoms Fever and Shortness of breath   Excluded other viral illnesses No (testing not indicated)      10/11/18 0754   10/11/18 0750  Lactic acid, plasma  Now then every  2 hours,   STAT     10/11/18 0749   10/11/18 0750  Blood Culture (routine x 2)  BLOOD CULTURE X 2,   STAT     10/11/18 0749   10/11/18 0750  Urinalysis, Routine w reflex microscopic  ONCE - STAT,   STAT     10/11/18 0749   Signed and Held  Magnesium  (ICU Tube Feeding: PEPuP )  5A & 5P,   R     Signed and Held   Signed and Held  Phosphorus  (ICU Tube Feeding: PEPuP )  5A & 5P,   R     Signed and Held   Signed and Held  CBC  (enoxaparin (LOVENOX)    CrCl >/= 30 ml/min)  Once,   R    Comments:  Baseline for enoxaparin therapy IF NOT ALREADY DRAWN.  Notify MD if PLT < 100 K.    Signed and Held   Signed and Held  Creatinine, serum  (enoxaparin (LOVENOX)    CrCl >/= 30 ml/min)  Once,   R    Comments:  Baseline for enoxaparin therapy IF NOT ALREADY DRAWN.    Signed and Held   Signed and Held  Creatinine, serum  (enoxaparin (LOVENOX)    CrCl >/= 30 ml/min)  Weekly,   R    Comments:  while on enoxaparin therapy    Signed and Held   Signed and Held  CBC  Tomorrow morning,   R     Signed and Held   Signed and Armed forces training and education officer morning,   R     Signed and Held   Signed and Held  Blood gas, arterial  Tomorrow morning,   R     Signed and Held   Signed and Held  Triglycerides  (propofol (DIPRIVAN))  Every 72 hours,   R    Comments:  While on propofol (DIPRIVAN)    Signed and Held   Signed and Held  Novel Coronavirus, NAA (hospital order; send-out to ref lab)  (Novel Coronavirus, NAA [facility] Order; send-out to ref lab) with precautions panel)  Once,   R    Question Answer Comment  Current symptoms Fever and Shortness of breath   Excluded other viral illnesses Yes   Exposure Risk None   Patient immune status Normal      Signed and Held   Signed and Held  Respiratory Panel by PCR  (Respiratory virus panel with precautions)  Once,   R    Question:  Patient immune status  Answer:  Normal   Signed and Held          Vitals/Pain Today's Vitals   10/11/18 0905  10/11/18 0910 10/11/18 0910 10/11/18 0915  BP: 104/77 99/76  114/84  Pulse: 97 95  97  Resp: (!) 22 (!) 0  19  Temp:      TempSrc:      SpO2: 94% 92%  94%  Weight:   105.2 kg   Height:    (1.702 m)     Isolation Precautions Droplet and Contact precautions  Medications Medications  fentaNYL (SUBLIMAZE) injection 50 mcg (50 mcg Intravenous Given 10/11/18 0921)  fentaNYL (SUBLIMAZE) injection 50 mcg (has no administration in time range)  midazolam (VERSED) injection 1 mg (has no administration in time range)  midazolam (VERSED) injection 1 mg (1 mg Intravenous Given 10/11/18 0835)  fentaNYL (SUBLIMAZE) 100 MCG/2ML injection (has no administration in time range)  cefTRIAXone (ROCEPHIN) 2 g in sodium chloride 0.9 % 100 mL IVPB (0 g Intravenous Stopped 10/11/18 0909)  azithromycin (ZITHROMAX) 500 mg in sodium chloride 0.9 % 250 mL IVPB (500 mg Intravenous New Bag/Given 10/11/18 0823)  vancomycin (VANCOCIN) 1,500 mg in sodium chloride 0.9 % 500 mL IVPB (has no administration in time range)  vancomycin (VANCOCIN) 1,250 mg in sodium chloride 0.9 % 250 mL IVPB (has no administration in time range)  fentaNYL (SUBLIMAZE) 100 MCG/2ML injection (  Given 10/11/18 0743)  fentaNYL (SUBLIMAZE) 100 MCG/2ML injection (  Given 10/11/18 0754)    Mobility walks     Focused Assessments Cardiac Assessment Handoff:  Cardiac Rhythm: Normal sinus rhythm Lab Results  Component Value Date   CKTOTAL 73 10/18/2011   CKMB 2.8 10/18/2011   TROPONINI 0.03 (HH) 10/11/2018   Lab Results  Component Value Date   DDIMER 0.29 12/07/2011   Does the Patient currently have chest pain? Yes     R Recommendations: See Admitting Provider Note  Report given to:   Additional Notes:  Pt able to follow commands at this time. Pt reporting chest pain "sore" where compressions were performed.

## 2018-10-11 NOTE — Progress Notes (Signed)
Initial Nutrition Assessment  DOCUMENTATION CODES:  Obesity unspecified  INTERVENTION:  Initiate TF via OGT/NGT with Vital HP at goal rate of 25 ml/h (600 ml per day) and Prostat 60 ml TID to provide 1200 (+333 from Propofol)  kcals, 143 gm protein, 502 ml free water daily.  NUTRITION DIAGNOSIS:  Inadequate oral intake related to inability to eat as evidenced by NPO status.  GOAL:  Provide needs based on ASPEN/SCCM guidelines  MONITOR:  TF tolerance, Diet advancement, Weight trends, Vent status, Labs, I & O's  REASON FOR ASSESSMENT:  Consult Enteral/tube feeding initiation and management  ASSESSMENT:  68 y/o male PMHx DM2, [name], PAD, fatty liver, COPD, CHF. Presented to ED via EMS after woke up today w/ quickly worsening SOB. Suffered PEA in ED. CPR successful. Intubated, transferred to ICU. Testing for COVID sent.   RD operating remotely d/t covid precautions.   Per chart, pt only woke up this am with issues. Presumably at baseline prior to today. Pt had reported sick contacts recently.   Pts wt on admission of 232 lbs appears to have been pulled from prior encounter. No other weights yet available. From chart, pt had been admitted to ICU in December for acute resp failure. He was discharged from that admission at 214.3 lbs.  Patient is currently intubated on ventilator support MV: not listed Temp (24hrs), Avg:96.6 F (35.9 C), Min:96.6 F (35.9 C), Max:96.6 F (35.9 C) Propofol: 12.6 ml/hr = 333 kcals/24 hrs BP: 127/82 (97)  Labs: BG: 334, BNP:342, TG:144, WBC:18.5, Albumin: 3.3, BUN/Creat:22/1.49 Meds: MVI with min, Aldactone, Lasix, IV abx, IVF, insulin, ppi  Recent Labs  Lab 10/11/18 0750 10/11/18 0844 10/11/18 1203  NA 139 138  --   K 4.3 4.4  --   CL 101  --   --   CO2 22  --   --   BUN 22  --   --   CREATININE 1.49*  --   --   CALCIUM 8.7*  --   --   MG  --   --  1.3*  PHOS  --   --  3.6  GLUCOSE 335*  --   --    NUTRITION - FOCUSED PHYSICAL  EXAM: Unable to assess Diet Order:   Diet Order    None     EDUCATION NEEDS:  No education needs have been identified at this time  Skin:  Skin Assessment: Reviewed RN Assessment  Last BM:  Unknown  Height:  Ht Readings from Last 1 Encounters:  10/11/18 5\' 7"  (1.702 m)   Weight:  Wt Readings from Last 1 Encounters:  10/11/18 105.2 kg   Wt Readings from Last 10 Encounters:  10/11/18 105.2 kg  06/24/18 105.2 kg  06/06/18 97.2 kg  05/06/18 110.2 kg  10/15/17 109.6 kg  11/12/16 100.2 kg  11/02/16 98.2 kg  10/10/13 95.2 kg  04/12/13 86.2 kg  04/05/12 85.3 kg   Ideal Body Weight:  67.27 kg  BMI:  Body mass index is 36.32 kg/m.  Estimated Nutritional Needs:  Kcal:  1160-1475 (11-14 kcal/kg bw) Protein:  135g Pro (>2g/kg ibw) Fluid:  Per MD discretion  Christophe Louis RD, LDN, CNSC Clinical Nutrition Available Tues-Sat via Pager: [phone] 10/11/2018 2:07 PM

## 2018-10-11 NOTE — ED Triage Notes (Addendum)
Patient arrived via EMS with shortness of breath EMS reports that patient was having shortness of breath before they arrived; also reports that PT was 74% on NRB; reports history of COPD and CHF, abdomen appears large, feels tight, and very distended  On arrival ,was was moaning for about one minutes and became unresponsive and pulseless  CPR in Progress

## 2018-10-11 NOTE — Progress Notes (Signed)
Pharmacy Antibiotic Note  [name] is a 68 y.o. male admitted on 10/11/2018 with pneumonia.  Pharmacy has been consulted for Vancomycin dosing. SCr 1.49 (BL ~1.2), CrCl 55 ml/min  Plan: Vancomycin 1500mg  IV x1, then 1250mg  IV every 24 hours Monitor renal function, Cx/PCR and clinical progression to narrow Vancomycin levels at steady state     No data recorded.  No results for input(s): WBC, CREATININE, LATICACIDVEN, VANCOTROUGH, VANCOPEAK, VANCORANDOM, GENTTROUGH, GENTPEAK, GENTRANDOM, TOBRATROUGH, TOBRAPEAK, TOBRARND, AMIKACINPEAK, AMIKACINTROU, AMIKACIN in the last 168 hours.  CrCl cannot be calculated (Patient's most recent lab result is older than the maximum 21 days allowed.).    Allergies  Allergen Reactions  . Cashew Nut Oil Palpitations  . Percocet [Oxycodone-Acetaminophen] Other (See Comments)    Hallucintaions    Antimicrobials this admission: Vanc 4/11>> Ceftriaxone 4/11>> Azithro 4/11>>  Dose adjustments this admission: n/a  Microbiology results: 4/11 BCx: sent 4/11 COVID19: sent  Daylene Posey, PharmD Clinical Pharmacist Please check AMION for all [facility] numbers 10/11/2018 8:10 AM

## 2018-10-11 NOTE — ED Provider Notes (Signed)
[facility] EMERGENCY DEPARTMENT Provider Note   CSN: [id number] Arrival date & time: 10/11/18  2505  LEVEL 5 CAVEAT - ALTERED MENTAL STATUS   History   Chief Complaint No chief complaint on file.   HPI [name] is a 68 y.o. male.     HPI  68 year old male brought in by EMS for respiratory distress.  History is very limited as when I am seeing the patient EMS is no longer present and CPR has been started.  According to his wife, who I talked to after resuscitation, the patient woke up this morning and felt short of breath and got severely worse to the point he called EMS.  EMS his blood pressure was around 220 systolic.  The patient was on a nonrebreather when brought into the ED and this was asked to be removed by nursing given the COVID pandemic.  I was then called immediately to the bedside as the patient is on no oxygen and has no pulse.  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   wife states blocked 30%   Diabetes mellitus 10/17/2011   newly dx today   Edema leg    right leg has leaky valve and right foot swells   Emphysema    Excessive ear wax    Fatty liver    Hernia    near navel   Hyperlipidemia    Hypertension    Leaky heart valve    Neuropathy    Rheumatic fever    Slow urinary stream    TIA (transient ischemic attack)     Patient Active Problem List   Diagnosis Date Noted   Chronic venous insufficiency 06/24/2018   Respiratory failure (HCC) 06/01/2018   Leg swelling 05/06/2018   Acute respiratory failure with hypoxemia (HCC) 10/14/2017   Influenza-like illness 10/14/2017   Delirium 10/14/2017   Acute urinary retention 10/14/2017   CAP (community acquired pneumonia)    (HFpEF) heart failure with preserved ejection fraction (HCC), pulmonary edema and LBBB of unknown chronicity  10/29/2016   Elevated troponin I level, presume demand ischemia  10/29/2016    AKI (acute kidney injury) (HCC) 10/29/2016   BPH (benign prostatic hyperplasia) 10/29/2016   Sepsis (HCC) 10/29/2016   Bacteremia due to Escherichia coli 10/29/2016   Fatty liver 10/29/2016   Acute pulmonary edema (HCC)    Acute hypoxic and hypercarbic respiratory failure (HCC) in setting of bilateral pulmonary infiltrates. Presume CAP + pulmonary edema +/-ALI 10/27/2016   Left-sided weakness 05/04/2015   TIA (transient ischemic attack) : Rule out. 05/04/2015   Hyperlipidemia 05/04/2015   Chest pain 10/10/2013   SOB (shortness of breath) 10/10/2013   Hyperglycemia without ketosis 10/17/2011   Diabetes mellitus (HCC) 10/17/2011   HTN (hypertension) 10/17/2011   Foot drop, left 10/17/2011   Impacted cerumen of right ear 10/17/2011   Dyspnea 10/17/2011   History of TIA (transient ischemic attack) 10/17/2011   Neuropathy (HCC) 10/17/2011   Aortic insufficiency 03/16/2011   Chest pain 03/16/2011    Past Surgical History:  Procedure Laterality Date   CIRCUMCISION     LITHOTRIPSY          Home Medications    Prior to Admission medications   Medication Sig Start Date End Date Taking? Authorizing Provider  albuterol (PROVENTIL HFA;VENTOLIN HFA) 108 (90 Base) MCG/ACT inhaler Inhale 2 puffs into the lungs every 6 (six) hours as needed for wheezing or shortness of breath. 10/18/17  Erick Blinks, MD  aspirin EC 81 MG tablet Take 81 mg by mouth daily.    [provider]  atorvastatin (LIPITOR) 40 MG tablet Take 1 tablet (40 mg total) by mouth every evening. 06/08/18   Noralee Stain, DO  carvedilol (COREG) 6.25 MG tablet Take 1 tablet (6.25 mg total) by mouth 2 (two) times daily with a meal. 06/08/18   Noralee Stain, DO  cholecalciferol (VITAMIN D) 1000 units tablet Take 1,000 Units by mouth daily.    [provider]  finasteride (PROSCAR) 5 MG tablet Take 5 mg by mouth daily.    [provider]  furosemide (LASIX) 20 MG tablet Take 1  tablet (20 mg total) by mouth daily. 06/09/18   Noralee Stain, DO  gabapentin (NEURONTIN) 300 MG capsule Take 600 mg by mouth 2 (two) times daily.     [provider]  hydrOXYzine (VISTARIL) 50 MG capsule Take 50 mg by mouth 3 (three) times daily.    [provider]  metFORMIN (GLUCOPHAGE) 1000 MG tablet Take 1,000 mg by mouth 2 (two) times daily. 05/08/18   [provider]  montelukast (SINGULAIR) 10 MG tablet Take 10 mg by mouth at bedtime.    [provider]  Multiple Vitamin (MULTIVITAMIN) tablet Take 1 tablet by mouth daily.    [provider]  pantoprazole (PROTONIX) 40 MG tablet Take 1 tablet (40 mg total) by mouth daily. 10/11/13   Philip Aspen, Limmie Patricia, MD  spironolactone (ALDACTONE) 25 MG tablet Take 1 tablet (25 mg total) by mouth daily. 06/09/18   Noralee Stain, DO  tamsulosin (FLOMAX) 0.4 MG CAPS capsule Take 0.4 mg by mouth daily. 05/03/15   [provider]    Family History Family History  Problem Relation Age of Onset   Emphysema Mother    Aneurysm Mother    Emphysema Father    Coronary artery disease Brother    Coronary artery disease Brother    Coronary artery disease Sister     Social History Social History   Tobacco Use   Smoking status: Never Smoker   Smokeless tobacco: Never Used  Substance Use Topics   Alcohol use: No   Drug use: No     Allergies   Cashew nut oil and Percocet [oxycodone-acetaminophen]   Review of Systems Review of Systems  Unable to perform ROS: Mental status change     Physical Exam Updated Vital Signs BP 130/89    Pulse (!) 114    Resp 16    SpO2 (!) 83%   Physical Exam Vitals signs and nursing note reviewed.  Constitutional:      Appearance: He is well-developed.  HENT:     Head: Normocephalic and atraumatic.     Right Ear: External ear normal.     Left Ear: External ear normal.     Nose: Nose normal.  Eyes:     General:        Right eye: No  discharge.        Left eye: No discharge.  Neck:     Musculoskeletal: Neck supple.  Cardiovascular:     Rate and Rhythm: Regular rhythm. Tachycardia present.  Pulmonary:     Breath sounds: Normal air entry.     Comments: No respiratory effort initially, once BVM placed he started having agonal respirations Abdominal:     General: There is distension.     Hernia: A hernia is present.  Skin:    General: Skin is warm and dry.  Neurological:     Mental Status: He is unresponsive.  Psychiatric:        Mood and Affect: Mood is not anxious.      ED Treatments / Results  Labs (all labs ordered are listed, but only abnormal results are displayed) Labs Reviewed  LACTIC ACID, PLASMA - Abnormal; Notable for the following components:      Result Value   Lactic Acid, Venous 5.1 (*)    All other components within normal limits  COMPREHENSIVE METABOLIC PANEL - Abnormal; Notable for the following components:   Glucose, Bld 335 (*)    Creatinine, Ser 1.49 (*)    Calcium 8.7 (*)    Albumin 3.3 (*)    GFR calc non Af Amer 48 (*)    GFR calc Af Amer 55 (*)    Anion gap 16 (*)    All other components within normal limits  CBC WITH DIFFERENTIAL/PLATELET - Abnormal; Notable for the following components:   WBC 18.5 (*)    Hemoglobin 11.4 (*)    MCHC 29.1 (*)    Neutro Abs 11.4 (*)    Lymphs Abs 4.9 (*)    Eosinophils Absolute 0.6 (*)    Basophils Absolute 0.2 (*)    Abs Immature Granulocytes 0.72 (*)    All other components within normal limits  TROPONIN I - Abnormal; Notable for the following components:   Troponin I 0.03 (*)    All other components within normal limits  BRAIN NATRIURETIC PEPTIDE - Abnormal; Notable for the following components:   B Natriuretic Peptide 342.4 (*)    All other components within normal limits  CBG MONITORING, ED - Abnormal; Notable for the following components:   Glucose-Capillary 322 (*)    All other components within normal limits  POCT I-STAT 7,  (LYTES, BLD GAS, ICA,H+H) - Abnormal; Notable for the following components:   pH, Arterial 7.279 (*)    pCO2 arterial 50.3 (*)    pO2, Arterial 63.0 (*)    Acid-base deficit 4.0 (*)    HCT 33.0 (*)    Hemoglobin 11.2 (*)    All other components within normal limits  CULTURE, BLOOD (ROUTINE X 2)  CULTURE, BLOOD (ROUTINE X 2)  NOVEL CORONAVIRUS, NAA (HOSPITAL ORDER, SEND-OUT TO REF LAB)  MRSA PCR SCREENING  PROTIME-INR  LACTIC ACID, PLASMA  URINALYSIS, ROUTINE W REFLEX MICROSCOPIC  RAPID URINE DRUG SCREEN, HOSP PERFORMED  ETHANOL  I-STAT ARTERIAL BLOOD GAS, ED    EKG EKG Interpretation  Date/Time:  Saturday October 11 2018 07:39:18 EDT Ventricular Rate:  110 PR Interval:    QRS Duration: 162 QT Interval:  455 QTC Calculation: 616 R Axis:   9 Text Interpretation:  Junctional tachycardia Left bundle branch block similar to Dec 2019 Confirmed by Pricilla Loveless [phone]) on 10/11/2018 7:56:52 AM   Radiology Dg Chest Portable 1 View  Result Date: 10/11/2018 CLINICAL DATA:  Respiratory difficulty EXAM: PORTABLE CHEST 1 VIEW COMPARISON:  06/04/2018 FINDINGS: Endotracheal tube is in place. Tip is 1.9 cm from the carina. There is extensive consolidation throughout the right lung with relative sparing at the right apex and right base. There is patchy airspace disease throughout the left lung. The heart is upper normal in size. Lungs are very under aerated. Vasculature is indistinct. IMPRESSION: Endotracheal tube as described Extensive bilateral airspace disease. Electronically Signed   By: Jolaine Click M.D.   On: 10/11/2018 09:04    Procedures .Critical Care Performed by: Pricilla Loveless, MD Authorized by: Criss Alvine,  Lorin PicketScott, MD   Critical care provider statement:    Critical care time (minutes):  45   Critical care time was exclusive of:  Separately billable procedures and treating other patients   Critical care was necessary to treat or prevent imminent or life-threatening deterioration  of the following conditions:  Respiratory failure   Critical care was time spent personally by me on the following activities:  Discussions with consultants, evaluation of patient's response to treatment, examination of patient, ordering and performing treatments and interventions, ordering and review of laboratory studies, ordering and review of radiographic studies, pulse oximetry, re-evaluation of patient's condition, obtaining history from patient or surrogate and review of old charts Procedure Name: Intubation Date/Time: 10/11/2018 8:40 AM Performed by: Pricilla LovelessGoldston, Alexande Sheerin, MD Oxygen Delivery Method: Ambu bag Laryngoscope Size: Glidescope and 3 Grade View: Grade III Tube size: 8.0 mm Number of attempts: 2 Airway Equipment and Method: Video-laryngoscopy Placement Confirmation: ETT inserted through vocal cords under direct vision,  CO2 detector and Breath sounds checked- equal and bilateral Tube secured with: ETT holder Dental Injury: Teeth and Oropharynx as per pre-operative assessment  Difficulty Due To: Difficulty was anticipated Comments: Difficult, grade 3 laryngoscopy. Initial ET placement not correct. After this became apparent another attempt corrected placement into cords, now with grade 2 view.      (including critical care time)  Cardiopulmonary Resuscitation (CPR) Procedure Note Directed/Performed by: Audree CamelScott T Jasmyn Picha I personally directed ancillary staff and/or performed CPR in an effort to regain return of spontaneous circulation and to maintain cardiac, neuro and systemic perfusion.    Medications Ordered in ED Medications  fentaNYL (SUBLIMAZE) injection 50 mcg (has no administration in time range)  fentaNYL (SUBLIMAZE) injection 50 mcg (has no administration in time range)  midazolam (VERSED) injection 1 mg (has no administration in time range)  midazolam (VERSED) injection 1 mg (1 mg Intravenous Given 10/11/18 0835)  fentaNYL (SUBLIMAZE) 100 MCG/2ML injection (has no  administration in time range)  cefTRIAXone (ROCEPHIN) 2 g in sodium chloride 0.9 % 100 mL IVPB (2 g Intravenous New Bag/Given 10/11/18 0831)  azithromycin (ZITHROMAX) 500 mg in sodium chloride 0.9 % 250 mL IVPB (500 mg Intravenous New Bag/Given 10/11/18 0823)  vancomycin (VANCOCIN) 1,500 mg in sodium chloride 0.9 % 500 mL IVPB (has no administration in time range)  fentaNYL (SUBLIMAZE) 100 MCG/2ML injection (  Given 10/11/18 0743)  fentaNYL (SUBLIMAZE) 100 MCG/2ML injection (  Given 10/11/18 0754)     Initial Impression / Assessment and Plan / ED Course  I have reviewed the triage vital signs and the nursing notes.  Pertinent labs & imaging results that were available during my care of the patient were reviewed by me and considered in my medical decision making (see chart for details).        Patient presents in respiratory failure and ultimately respiratory arrest.  Brief CPR ensued while tube was placed.  As above, initial tube placement incorrect and this was corrected with slow return of oxygenation.  Patient progressively became more aware and was requiring sedation.  At one point though he is awake but not fighting the vent and is following commands.  Initial head CT that was ordered will be held off at this time given apparent good neurologic status at this time, though testing is obviously somewhat limited based on him being ventilated.  This is a recurrent issue for him but in the current COVID-19 pandemic, I will send off coronavirus testing.  ICU has been consulted and Dr. Kendrick FriesMcquaid  will admit as the patient is currently in critical condition.  He was given broad community-acquired antibiotics and vancomycin was added given his severe illness and what appears to be bilateral infiltrates.  Given the sudden onset and reported hypertension at home, this could be all or partially related to acute pulmonary edema from hypertensive crisis.  Blood pressures currently adequately controlled.  [name] was evaluated in Emergency Department on 10/11/2018 for the symptoms described in the history of present illness. He was evaluated in the context of the global COVID-19 pandemic, which necessitated consideration that the patient might be at risk for infection with the SARS-CoV-2 virus that causes COVID-19. Institutional protocols and algorithms that pertain to the evaluation of patients at risk for COVID-19 are in a state of rapid change based on information released by regulatory bodies including the CDC and federal and state organizations. These policies and algorithms were followed during the patient's care in the ED.   Final Clinical Impressions(s) / ED Diagnoses   Final diagnoses:  Acute respiratory failure with hypoxia [facility])  Respiratory arrest before cardiac arrest [facility])    ED Discharge Orders    None       Pricilla Loveless, MD 10/11/18 [phone]

## 2018-10-11 NOTE — ED Notes (Signed)
RN attempted to place OG tube at this time. Pt biting on tube. Unable to place OG tube at this time.

## 2018-10-11 NOTE — ED Notes (Signed)
Paged critical care.  

## 2018-10-11 NOTE — Progress Notes (Signed)
[facility] PCCM  Oxygenation improving Would like to check ABG now to see current P/F and adjust vent per that Sedate to RASS to -1 Making urine Continue tube feeding  Heber Modena, MD  PCCM Pager: [phone] Cell: [phone] If no response, call [phone]

## 2018-10-11 NOTE — Procedures (Signed)
Central Venous Catheter Insertion Procedure Note [name] [id number] [birth year]  Procedure: Insertion of Central Venous Catheter Indications: Assessment of intravascular volume  Procedure Details Consent: Unable to obtain consent because of emergent medical necessity. Time Out: Verified patient identification, verified procedure, site/side was marked, verified correct patient position, special equipment/implants available, medications/allergies/relevent history reviewed, required imaging and test results available.  Performed  Maximum sterile technique was used including antiseptics, cap, gloves, gown, hand hygiene, mask and sheet. Skin prep: Chlorhexidine; local anesthetic administered A antimicrobial bonded/coated triple lumen catheter was placed in the right internal jugular vein using the Seldinger technique.  Ultrasound was used to verify the patency of the vein and for real time needle guidance.  Evaluation Blood flow good Complications: No apparent complications Patient did tolerate procedure well. Chest X-ray ordered to verify placement.  CXR: pending.  Max Fickle 10/11/2018, 9:42 AM

## 2018-10-11 NOTE — ED Notes (Signed)
MD attempted to pass OG tube at this time. Pt biting down on tube unable to place tube.

## 2018-10-11 NOTE — Progress Notes (Signed)
PCCM Note   Evaluated pt at start of shift RASS -1 on Propofol gtt Triglycerides 114  Last BG check was 202 MAP >59mmHg Electrolytes: K+ 4.6 Mg 1.3 Phos 3.7 Corrected Calcium 8.4 (Albumin 3.3) Creatinine 1.14 GFR >60 hgb 11 baseline 8-9 WBC 18.5 (baseline 9) Plts 369   Per RN when sedation is weaned pt is appropriate and follows commands. Current Vent settings post last ABG PRVC TV 580cc RR 22 PEEP 10 FiO2 60% Plan: Will continue current Plan at this time Will attempt to wean FiO2 as tolerated Replaced Magnesium 2g IV x 1 If BG is still >200 with sliding scale may switch to insulin gtt  Signed Dr Newell Coral Pulmonary Critical Care Locums

## 2018-10-11 NOTE — H&P (Signed)
NAME:  [name], MRN:  [id number], DOB:  08/02/1950, LOS: 0 ADMISSION DATE:  10/11/2018, CONSULTATION DATE: October 11, 2018 REFERRING MD: Dr. Criss Alvine, CHIEF COMPLAINT: Shortness of breath  Brief History   68 year old male with a past medical history significant for diabetes mellitus, peripheral arterial disease and fatty liver came to the [facility] emergency department with shortness of breath.  Was noted to be profoundly hypoxemic. In the ER had a respiratory and cardiac arrest (PEA) lasting 5 minutes.    History of present illness   68 year old male with a past medical history significant for diabetes mellitus, peripheral arterial disease and fatty liver came to the [facility] emergency department with shortness of breath.  Was noted to be profoundly hypoxemic. In the ER had a respiratory and cardiac arrest (PEA) lasting 5 minutes.  After intubation he was noted to be profoundly hypoxemic.  I was unable to obtain history from the patient because he was intubated and sedated.  However, the staff tells me that he was awake and alert after the cardiac arrest and able to interact.  They asked him if there had been people in the home who had been sick and he answered yes.  He did not know if they had tested positive for corona virus.  However, he reported that they had been sick with a cough.  Past Medical History  Fatty liver Peripheral arterial disease Diabetes mellitus type 2 Hypertension  Significant Hospital Events     Consults:    Procedures:  October 11, 2018 endotracheal tube October 11, 2018 right internal jugular central line  Significant Diagnostic Tests:    Micro Data:  April 11 novel coronavirus April 11 respiratory culture April 11 blood culture April 11 respiratory viral panel  Antimicrobials:  April 11 ceftriaxone April 11 azithromycin  Interim history/subjective:    Objective   Blood pressure 114/84, pulse 97, temperature (!) 96.6 F (35.9 C), temperature  source Rectal, resp. rate 19, height 5\' 7"  (1.702 m), weight 105.2 kg, SpO2 94 %.    Vent Mode: PRVC FiO2 (%):  [100 %] 100 % Set Rate:  [22 bmp] 22 bmp Vt Set:  [580 mL] 580 mL PEEP:  [12 cmH20] 12 cmH20 Plateau Pressure:  [14 cmH20] 14 cmH20   Intake/Output Summary (Last 24 hours) at 10/11/2018 5885 Last data filed at 10/11/2018 0277 Gross per 24 hour  Intake 100 ml  Output -  Net 100 ml   Filed Weights   10/11/18 0910  Weight: 105.2 kg    Examination:  General:  In bed on vent HENT: NCAT ETT in place PULM: Crackles, rhonchi bilaterally B, vent supported breathing CV: RRR, no mgr GI: BS+, soft, nontender MSK: normal bulk and tone Neuro: sedated on vent    Resolved Hospital Problem list     Assessment & Plan:  Acute respiratory failure with hypoxemia: Likely due to community-acquired pneumonia versus flash pulmonary edema > Full mechanical ventilatory support via ARDS protocol > 60 cc/kg ideal body weight for tidal volume > Sedate to RA SS score -1 to -2 > Ventilator associated pneumonia prevention protocol > Daily wake-up assessment > follow oxygenation : at this point focus on sedation   Acute decompensated diastolic heart failure: > Diurese as blood pressure allows > Continue Coreg, spironolactone, furosemide, ARB anemia  [name]: at baseline > Monitor BMET and UOP > Replace electrolytes as needed  Severe community acquired pneumonia: > send resp viral panel, urine strep/leg antigen, COVID 19 > ceftriaxone/azithro > f/u  culture data  DM2 > SSI q4h  Fatty liver > check LFT  Cardiac arrest: due to respiratory failure > Tele   Best practice:  Diet: start tube Pain/Anxiety/Delirium protocol (if indicated): RASS -1 to -2 VAP protocol (if indicated): yes DVT prophylaxis: lovenox GI prophylaxis: pantoprazole Glucose control: SSI Mobility: bed res Code Status: full Family Communication: I attempted to update his wife Kendal HymenBonnie by phone on 10/11/2018, she  did not answer, left a message Disposition: remain in ICU  Labs   CBC: Recent Labs  Lab 10/11/18 0750 10/11/18 0844  WBC 18.5*  --   NEUTROABS 11.4*  --   HGB 11.4* 11.2*  HCT 39.2 33.0*  MCV 92.2  --   PLT 369  --     Basic Metabolic Panel: Recent Labs  Lab 10/11/18 0750 10/11/18 0844  NA 139 138  K 4.3 4.4  CL 101  --   CO2 22  --   GLUCOSE 335*  --   BUN 22  --   CREATININE 1.49*  --   CALCIUM 8.7*  --    GFR: Estimated Creatinine Clearance: 54.8 mL/min (A) (by C-G formula based on SCr of 1.49 mg/dL (H)). Recent Labs  Lab 10/11/18 0750  WBC 18.5*  LATICACIDVEN 5.1*    Liver Function Tests: Recent Labs  Lab 10/11/18 0750  AST 32  ALT 35  ALKPHOS 117  BILITOT 0.4  PROT 7.3  ALBUMIN 3.3*   No results for input(s): LIPASE, AMYLASE in the last 168 hours. No results for input(s): AMMONIA in the last 168 hours.  ABG    Component Value Date/Time   PHART 7.279 (L) 10/11/2018 0844   PCO2ART 50.3 (H) 10/11/2018 0844   PO2ART 63.0 (L) 10/11/2018 0844   HCO3 23.9 10/11/2018 0844   TCO2 25 10/11/2018 0844   ACIDBASEDEF 4.0 (H) 10/11/2018 0844   O2SAT 90.0 10/11/2018 0844     Coagulation Profile: Recent Labs  Lab 10/11/18 0750  INR 1.1    Cardiac Enzymes: Recent Labs  Lab 10/11/18 0750  TROPONINI 0.03*    HbA1C: Hgb A1c MFr Bld  Date/Time Value Ref Range Status  10/15/2017 01:15 PM 10.6 (H) 4.8 - 5.6 % Final    Comment:    (NOTE) Pre diabetes:          5.7%-6.4% Diabetes:              >6.4% Glycemic control for   <7.0% adults with diabetes   10/28/2016 07:25 AM 10.1 (H) 4.8 - 5.6 % Final    Comment:    (NOTE)         Pre-diabetes: 5.7 - 6.4         Diabetes: >6.4         Glycemic control for adults with diabetes: <7.0     CBG: Recent Labs  Lab 10/11/18 0809  GLUCAP 322*    Review of Systems/family/social  Cannot obtain due to intubation Allergies  Allergen Reactions  . Cashew Nut Oil Palpitations  . Percocet  [Oxycodone-Acetaminophen] Other (See Comments)    Hallucintaions     Home Medications  Prior to Admission medications   Medication Sig Start Date End Date Taking? Authorizing Provider  albuterol (PROVENTIL HFA;VENTOLIN HFA) 108 (90 Base) MCG/ACT inhaler Inhale 2 puffs into the lungs every 6 (six) hours as needed for wheezing or shortness of breath. 10/18/17  Yes Erick BlinksMemon, Jehanzeb, MD  aspirin EC 81 MG tablet Take 81 mg by mouth daily.   Yes [provider]  atorvastatin (LIPITOR) 40 MG tablet Take 1 tablet (40 mg total) by mouth every evening. 06/08/18  Yes Noralee Stain, DO  carvedilol (COREG) 6.25 MG tablet Take 1 tablet (6.25 mg total) by mouth 2 (two) times daily with a meal. 06/08/18  Yes Noralee Stain, DO  finasteride (PROSCAR) 5 MG tablet Take 5 mg by mouth daily.   Yes [provider]  furosemide (LASIX) 20 MG tablet Take 1 tablet (20 mg total) by mouth daily. 06/09/18  Yes Noralee Stain, DO  gabapentin (NEURONTIN) 300 MG capsule Take 300 mg by mouth 4 (four) times daily.    Yes [provider]  glipiZIDE (GLUCOTROL XL) 5 MG 24 hr tablet Take 5 mg by mouth daily.   Yes [provider]  hydrOXYzine (VISTARIL) 50 MG capsule Take 50 mg by mouth 3 (three) times daily.   Yes [provider]  metFORMIN (GLUCOPHAGE) 1000 MG tablet Take 1,000 mg by mouth 2 (two) times daily. 05/08/18  Yes [provider]  montelukast (SINGULAIR) 10 MG tablet Take 10 mg by mouth at bedtime.   Yes [provider]  Multiple Vitamin (MULTIVITAMIN) tablet Take 1 tablet by mouth daily.   Yes [provider]  pantoprazole (PROTONIX) 40 MG tablet Take 1 tablet (40 mg total) by mouth daily. 10/11/13  Yes Philip Aspen, Limmie Patricia, MD  potassium chloride (K-DUR) 10 MEQ tablet Take 10 mEq by mouth daily.   Yes [provider]  spironolactone (ALDACTONE) 25 MG tablet Take 1 tablet (25 mg total) by mouth daily. 06/09/18  Yes Noralee Stain, DO   tamsulosin (FLOMAX) 0.4 MG CAPS capsule Take 0.4 mg by mouth daily. 05/03/15  Yes [provider]  valsartan (DIOVAN) 320 MG tablet Take 320 mg by mouth daily.   Yes [provider]     Critical care time: 60 minutes    Heber Laplace, MD [facility] Pager: [phone] Cell: [phone] If no response, call [phone]

## 2018-10-12 ENCOUNTER — Inpatient Hospital Stay (HOSPITAL_COMMUNITY): Payer: Medicare Other

## 2018-10-12 DIAGNOSIS — I469 Cardiac arrest, cause unspecified: Secondary | ICD-10-CM

## 2018-10-12 DIAGNOSIS — E8770 Fluid overload, unspecified: Secondary | ICD-10-CM

## 2018-10-12 DIAGNOSIS — Z9911 Dependence on respirator [ventilator] status: Secondary | ICD-10-CM

## 2018-10-12 LAB — BASIC METABOLIC PANEL
Anion gap: 8 (ref 5–15)
Anion gap: 8 (ref 5–15)
BUN: 27 mg/dL — ABNORMAL HIGH (ref 8–23)
BUN: 31 mg/dL — ABNORMAL HIGH (ref 8–23)
CO2: 26 mmol/L (ref 22–32)
CO2: 28 mmol/L (ref 22–32)
Calcium: 8.2 mg/dL — ABNORMAL LOW (ref 8.9–10.3)
Calcium: 8.1 mg/dL — ABNORMAL LOW (ref 8.9–10.3)
Chloride: 105 mmol/L (ref 98–111)
Chloride: 100 mmol/L (ref 98–111)
Creatinine, Ser: 1.25 mg/dL — ABNORMAL HIGH (ref 0.61–1.24)
Creatinine, Ser: 1.31 mg/dL — ABNORMAL HIGH (ref 0.61–1.24)
GFR calc Af Amer: >60 mL/min (ref >60)
GFR calc Af Amer: >60 mL/min (ref >60)
GFR calc non Af Amer: 59 mL/min — ABNORMAL LOW (ref >60)
GFR calc non Af Amer: 56 mL/min — ABNORMAL LOW (ref >60)
Glucose, Bld: 183 mg/dL — ABNORMAL HIGH (ref 70–99)
Glucose, Bld: 164 mg/dL — ABNORMAL HIGH (ref 70–99)
Potassium: 4.2 mmol/L (ref 3.5–5.1)
Potassium: 3.6 mmol/L (ref 3.5–5.1)
Sodium: 139 mmol/L (ref 135–145)
Sodium: 136 mmol/L (ref 135–145)

## 2018-10-12 LAB — POCT I-STAT 7, (LYTES, BLD GAS, ICA,H+H)
Acid-Base Excess: 3 mmol/L — ABNORMAL HIGH (ref 0.0–2.0)
Bicarbonate: 27.3 mmol/L (ref 20.0–28.0)
Calcium, Ion: 1.14 mmol/L — ABNORMAL LOW (ref 1.15–1.40)
HCT: 28 % — ABNORMAL LOW (ref 39.0–52.0)
Hemoglobin: 9.5 g/dL — ABNORMAL LOW (ref 13.0–17.0)
O2 Saturation: 97 %
Patient temperature: 98.8
Potassium: 4.2 mmol/L (ref 3.5–5.1)
Sodium: 138 mmol/L (ref 135–145)
TCO2: 28 mmol/L (ref 22–32)
pCO2 arterial: 40.1 mmHg (ref 32.0–48.0)
pH, Arterial: 7.441 (ref 7.350–7.450)
pO2, Arterial: 90 mmHg (ref 83.0–108.0)

## 2018-10-12 LAB — GLUCOSE, CAPILLARY
Glucose-Capillary: 176 mg/dL — ABNORMAL HIGH (ref 70–99)
Glucose-Capillary: 170 mg/dL — ABNORMAL HIGH (ref 70–99)
Glucose-Capillary: 153 mg/dL — ABNORMAL HIGH (ref 70–99)
Glucose-Capillary: 151 mg/dL — ABNORMAL HIGH (ref 70–99)
Glucose-Capillary: 149 mg/dL — ABNORMAL HIGH (ref 70–99)
Glucose-Capillary: 163 mg/dL — ABNORMAL HIGH (ref 70–99)
Glucose-Capillary: 197 mg/dL — ABNORMAL HIGH (ref 70–99)

## 2018-10-12 LAB — CBC
HCT: 30.2 % — ABNORMAL LOW (ref 39.0–52.0)
Hemoglobin: 9.3 g/dL — ABNORMAL LOW (ref 13.0–17.0)
MCH: 27.3 pg (ref 26.0–34.0)
MCHC: 30.8 g/dL (ref 30.0–36.0)
MCV: 88.6 fL (ref 80.0–100.0)
Platelets: 229 10*3/uL (ref 150–400)
RBC: 3.41 MIL/uL — ABNORMAL LOW (ref 4.22–5.81)
RDW: 15.3 % (ref 11.5–15.5)
WBC: 10 10*3/uL (ref 4.0–10.5)
nRBC: 0 % (ref 0.0–0.2)

## 2018-10-12 LAB — PHOSPHORUS
Phosphorus: 3.3 mg/dL (ref 2.5–4.6)
Phosphorus: 4.1 mg/dL (ref 2.5–4.6)
Phosphorus: 3.8 mg/dL (ref 2.5–4.6)

## 2018-10-12 LAB — POCT ACTIVATED CLOTTING TIME: Activated Clotting Time: 0 seconds

## 2018-10-12 LAB — MAGNESIUM
Magnesium: 2 mg/dL (ref 1.7–2.4)
Magnesium: 2 mg/dL (ref 1.7–2.4)
Magnesium: 1.9 mg/dL (ref 1.7–2.4)

## 2018-10-12 MED ORDER — FUROSEMIDE 10 MG/ML IJ SOLN
40.0000 mg | Freq: Three times a day (TID) | INTRAMUSCULAR | Status: DC
  Administered 2018-10-12 – 2018-10-13 (×3): 40 mg via INTRAVENOUS
  Filled 2018-10-12 (×3): qty 4

## 2018-10-12 NOTE — Progress Notes (Addendum)
eLink Physician-Brief Progress Note Patient Name: [name] DOB: August 20, 1950 MRN: [id number]   Date of Service  10/12/2018  HPI/Events of Note  Renew restraints  eICU Interventions  Camera assesemnt done. On Vent.  Non violent soft restraints renewed to prevent self injury and harm    K 3.5, elinc RN ordered Kcl q4 hr. As per protocol. Ordered K level at 10 AM. If normal, can DC q 4hr K protocol.   Intervention Category Minor Interventions: Agitation / anxiety - evaluation and management  Ranee Gosselin 10/12/2018, 9:51 PM

## 2018-10-12 NOTE — Progress Notes (Addendum)
NAME:  [name], MRN:  [id number], DOB:  [birth year], LOS: 1 ADMISSION DATE:  10/11/2018, CONSULTATION DATE: October 11, 2018 REFERRING MD: Dr. Criss Alvine, CHIEF COMPLAINT: Shortness of breath  Brief History   68 year old male with a past medical history significant for diabetes mellitus, peripheral arterial disease and fatty liver came to the [facility] emergency department with shortness of breath.  Was noted to be profoundly hypoxemic. In the ER had a respiratory and cardiac arrest (PEA) lasting 5 minutes.    History of present illness   68 year old male with a past medical history significant for diabetes mellitus, peripheral arterial disease and fatty liver came to the [facility] emergency department with shortness of breath.  Was noted to be profoundly hypoxemic. In the ER had a respiratory and cardiac arrest (PEA) lasting 5 minutes.  After intubation he was noted to be profoundly hypoxemic.  I was unable to obtain history from the patient because he was intubated and sedated.  However, the staff tells me that he was awake and alert after the cardiac arrest and able to interact.  They asked him if there had been people in the home who had been sick and he answered yes.  He did not know if they had tested positive for corona virus.  However, he reported that they had been sick with a cough.  Past Medical History  Fatty liver Peripheral arterial disease Diabetes mellitus type 2 Hypertension  Significant Hospital Events   4/11 - ICU admit   Consults:  4/11 - PCCM   Procedures:  October 11, 2018 endotracheal tube October 11, 2018 right internal jugular central line  Significant Diagnostic Tests:   Micro Data:  April 11 novel coronavirus - pending  April 11 respiratory culture -  pending  April 11 blood culture - NGTD  April 11 respiratory viral panel - negative   Antimicrobials:  April 11 ceftriaxone April 11 azithromycin  Interim history/subjective:  No issues overnight.  Oxygenating well. Tolerating SBT   Objective   Blood pressure 125/75, pulse 85, temperature 98.8 F (37.1 C), temperature source Axillary, resp. rate (!) 22, height  (1.702 m), weight 109.4 kg, SpO2 99 %.    Vent Mode: PRVC FiO2 (%):  [40 %-100 %] 40 % Set Rate:  [22 bmp] 22 bmp Vt Set:  [580 mL] 580 mL PEEP:  [8 cmH20-12 cmH20] 8 cmH20 Pressure Support:  [10 cmH20] 10 cmH20 Plateau Pressure:  [18 cmH20-23 cmH20] 18 cmH20   Intake/Output Summary (Last 24 hours) at 10/12/2018 0908 Last data filed at 10/12/2018 0800 Gross per 24 hour  Intake 894.7 ml  Output 1655 ml  Net -760.3 ml   Filed Weights   10/11/18 0910 10/12/18 0500  Weight: 105.2 kg 109.4 kg    Examination:  General: comfortable, intubated, on vent  HENT: NCAT, sclera clear  PULM: BL vented breath sounds  CV: RRR, no mrg  GI: BS+ soft, nt nd  MSK: normal bulk and tone  Neuro: sedated on vent, follows basic commands, sedation was just held this am for SAT SBT   Resolved Hospital Problem list     Assessment & Plan:   Acute respiratory failure with hypoxemia Likely due to community-acquired pneumonia versus flash pulmonary edema CXR: reviewed - BL infiltrates, The patient's images have been independently reviewed by me.   > continue full mechanical support >ARDS protocol  >LTVV >VAP present  >Daily SAT/SBT - tolerating well today, consider liberation from vent  >repeat CXR today  >  clinically looks volume overloaded, we will give additional lasix   Acute decompensated diastolic heart failure: > continue diuresis today, 40mg  q8h X 4 doses  >o2 requirements are improving  >continue cardiac regimen, coreg, spirono, lasix and arb   [name]: at baseline > follow UOP > replace lytes as needed   Severe community acquired pneumonia: > RVP negative, covid pending  > ceft/azitho - pending   DM2 > CBGS with SSI   Fatty liver > monitor   Cardiac arrest: due to respiratory failure > observing    Best  practice:  Diet: tf  Pain/Anxiety/Delirium protocol (if indicated): RASS -1 to -2 VAP protocol (if indicated): yes DVT prophylaxis: lovenox GI prophylaxis: pantoprazole Glucose control: SSI Mobility: bed rest Code Status: full Family Communication: we will attempt again today  Disposition: remain in ICU  Labs   CBC: Recent Labs  Lab 10/11/18 0750 10/11/18 0844 10/11/18 1519 10/12/18 0517 10/12/18 0518  WBC 18.5*  --   --  10.0  --   NEUTROABS 11.4*  --   --   --   --   HGB 11.4* 11.2* 10.5* 9.3* 9.5*  HCT 39.2 33.0* 31.0* 30.2* 28.0*  MCV 92.2  --   --  88.6  --   PLT 369  --   --  229  --     Basic Metabolic Panel: Recent Labs  Lab 10/11/18 0750 10/11/18 0844 10/11/18 1203 10/11/18 1519 10/11/18 1610 10/12/18 0517 10/12/18 0518  NA 139 138  --  139 139 139 138  K 4.3 4.4  --  4.5 4.6 4.2 4.2  CL 101  --   --   --  103 105  --   CO2 22  --   --   --  25 26  --   GLUCOSE 335*  --   --   --  202* 183*  --   BUN 22  --   --   --  22 27*  --   CREATININE 1.49*  --   --   --  1.14 1.25*  --   CALCIUM 8.7*  --   --   --  8.3* 8.2*  --   MG  --   --  1.3*  --  1.3* 2.0  --   PHOS  --   --  3.6  --  3.7 3.3  --    GFR: Estimated Creatinine Clearance: 66.7 mL/min (A) (by C-G formula based on SCr of 1.25 mg/dL (H)). Recent Labs  Lab 10/11/18 0750 10/11/18 1610 10/12/18 0517  WBC 18.5*  --  10.0  LATICACIDVEN 5.1* 1.3  --     Liver Function Tests: Recent Labs  Lab 10/11/18 0750  AST 32  ALT 35  ALKPHOS 117  BILITOT 0.4  PROT 7.3  ALBUMIN 3.3*   No results for input(s): LIPASE, AMYLASE in the last 168 hours. No results for input(s): AMMONIA in the last 168 hours.  ABG    Component Value Date/Time   PHART 7.441 10/12/2018 0518   PCO2ART 40.1 10/12/2018 0518   PO2ART 90.0 10/12/2018 0518   HCO3 27.3 10/12/2018 0518   TCO2 28 10/12/2018 0518   ACIDBASEDEF 4.0 (H) 10/11/2018 0844   O2SAT 97.0 10/12/2018 0518     Coagulation Profile: Recent Labs   Lab 10/11/18 0750  INR 1.1    Cardiac Enzymes: Recent Labs  Lab 10/11/18 0750  TROPONINI 0.03*    HbA1C: Hgb A1c MFr Bld  Date/Time Value Ref Range  Status  10/15/2017 01:15 PM 10.6 (H) 4.8 - 5.6 % Final    Comment:    (NOTE) Pre diabetes:          5.7%-6.4% Diabetes:              >6.4% Glycemic control for   <7.0% adults with diabetes   10/28/2016 07:25 AM 10.1 (H) 4.8 - 5.6 % Final    Comment:    (NOTE)         Pre-diabetes: 5.7 - 6.4         Diabetes: >6.4         Glycemic control for adults with diabetes: <7.0     CBG: Recent Labs  Lab 10/11/18 1116 10/11/18 1549 10/11/18 2035 10/12/18 0138 10/12/18 0515  GLUCAP 214* 202* 144* 176* 170*    Review of Systems/family/social  Cannot obtain due to intubation Allergies  Allergen Reactions  . Cashew Nut Oil Palpitations  . Percocet [Oxycodone-Acetaminophen] Other (See Comments)    Hallucintaions     Home Medications  Prior to Admission medications   Medication Sig Start Date End Date Taking? Authorizing Provider  albuterol (PROVENTIL HFA;VENTOLIN HFA) 108 (90 Base) MCG/ACT inhaler Inhale 2 puffs into the lungs every 6 (six) hours as needed for wheezing or shortness of breath. 10/18/17  Yes Erick Blinks, MD  aspirin EC 81 MG tablet Take 81 mg by mouth daily.   Yes [provider]  atorvastatin (LIPITOR) 40 MG tablet Take 1 tablet (40 mg total) by mouth every evening. 06/08/18  Yes Noralee Stain, DO  carvedilol (COREG) 6.25 MG tablet Take 1 tablet (6.25 mg total) by mouth 2 (two) times daily with a meal. 06/08/18  Yes Noralee Stain, DO  finasteride (PROSCAR) 5 MG tablet Take 5 mg by mouth daily.   Yes [provider]  furosemide (LASIX) 20 MG tablet Take 1 tablet (20 mg total) by mouth daily. 06/09/18  Yes Noralee Stain, DO  gabapentin (NEURONTIN) 300 MG capsule Take 300 mg by mouth 4 (four) times daily.    Yes [provider]  glipiZIDE (GLUCOTROL XL) 5 MG 24 hr tablet Take 5 mg  by mouth daily.   Yes [provider]  hydrOXYzine (VISTARIL) 50 MG capsule Take 50 mg by mouth 3 (three) times daily.   Yes [provider]  metFORMIN (GLUCOPHAGE) 1000 MG tablet Take 1,000 mg by mouth 2 (two) times daily. 05/08/18  Yes [provider]  montelukast (SINGULAIR) 10 MG tablet Take 10 mg by mouth at bedtime.   Yes [provider]  Multiple Vitamin (MULTIVITAMIN) tablet Take 1 tablet by mouth daily.   Yes [provider]  pantoprazole (PROTONIX) 40 MG tablet Take 1 tablet (40 mg total) by mouth daily. 10/11/13  Yes Philip Aspen, Limmie Patricia, MD  potassium chloride (K-DUR) 10 MEQ tablet Take 10 mEq by mouth daily.   Yes [provider]  spironolactone (ALDACTONE) 25 MG tablet Take 1 tablet (25 mg total) by mouth daily. 06/09/18  Yes Noralee Stain, DO  tamsulosin (FLOMAX) 0.4 MG CAPS capsule Take 0.4 mg by mouth daily. 05/03/15  Yes [provider]  valsartan (DIOVAN) 320 MG tablet Take 320 mg by mouth daily.   Yes [provider]     This patient is critically ill with multiple organ system failure; which, requires frequent high complexity decision making, assessment, support, evaluation, and titration of therapies. This was completed through the application of advanced monitoring technologies and extensive interpretation of multiple databases.  During this encounter critical care time was devoted to patient care services described in this note for 34 minutes.   Josephine Igo, DO [facility] 10/12/2018 9:08 AM  Personal pager: [phone] If unanswered, please page CCM On-call: #[phone]

## 2018-10-13 ENCOUNTER — Encounter (HOSPITAL_COMMUNITY): Payer: Self-pay

## 2018-10-13 DIAGNOSIS — R600 Localized edema: Secondary | ICD-10-CM

## 2018-10-13 LAB — CBC WITH DIFFERENTIAL/PLATELET
Abs Immature Granulocytes: 0.04 10*3/uL (ref 0.00–0.07)
Basophils Absolute: 0.1 10*3/uL (ref 0.0–0.1)
Basophils Relative: 1 %
Eosinophils Absolute: 0.1 10*3/uL (ref 0.0–0.5)
Eosinophils Relative: 1 %
HCT: 30.2 % — ABNORMAL LOW (ref 39.0–52.0)
Hemoglobin: 9.7 g/dL — ABNORMAL LOW (ref 13.0–17.0)
Immature Granulocytes: 0 %
Lymphocytes Relative: 10 %
Lymphs Abs: 1 10*3/uL (ref 0.7–4.0)
MCH: 28 pg (ref 26.0–34.0)
MCHC: 32.1 g/dL (ref 30.0–36.0)
MCV: 87.3 fL (ref 80.0–100.0)
Monocytes Absolute: 0.5 10*3/uL (ref 0.1–1.0)
Monocytes Relative: 5 %
Neutro Abs: 8.1 10*3/uL — ABNORMAL HIGH (ref 1.7–7.7)
Neutrophils Relative %: 83 %
Platelets: 247 10*3/uL (ref 150–400)
RBC: 3.46 MIL/uL — ABNORMAL LOW (ref 4.22–5.81)
RDW: 15.5 % (ref 11.5–15.5)
WBC: 9.7 10*3/uL (ref 4.0–10.5)
nRBC: 0 % (ref 0.0–0.2)

## 2018-10-13 LAB — BASIC METABOLIC PANEL
Anion gap: 14 (ref 5–15)
BUN: 35 mg/dL — ABNORMAL HIGH (ref 8–23)
CO2: 27 mmol/L (ref 22–32)
Calcium: 8.6 mg/dL — ABNORMAL LOW (ref 8.9–10.3)
Chloride: 98 mmol/L (ref 98–111)
Creatinine, Ser: 1.4 mg/dL — ABNORMAL HIGH (ref 0.61–1.24)
GFR calc Af Amer: 59 mL/min — ABNORMAL LOW (ref >60)
GFR calc non Af Amer: 51 mL/min — ABNORMAL LOW (ref >60)
Glucose, Bld: 181 mg/dL — ABNORMAL HIGH (ref 70–99)
Potassium: 3.5 mmol/L (ref 3.5–5.1)
Sodium: 139 mmol/L (ref 135–145)

## 2018-10-13 LAB — GLUCOSE, CAPILLARY
Glucose-Capillary: 180 mg/dL — ABNORMAL HIGH (ref 70–99)
Glucose-Capillary: 165 mg/dL — ABNORMAL HIGH (ref 70–99)
Glucose-Capillary: 73 mg/dL (ref 70–99)
Glucose-Capillary: 173 mg/dL — ABNORMAL HIGH (ref 70–99)
Glucose-Capillary: 188 mg/dL — ABNORMAL HIGH (ref 70–99)
Glucose-Capillary: 163 mg/dL — ABNORMAL HIGH (ref 70–99)

## 2018-10-13 LAB — POTASSIUM: Potassium: 4 mmol/L (ref 3.5–5.1)

## 2018-10-13 MED ORDER — INSULIN ASPART 100 UNIT/ML ~~LOC~~ SOLN
3.0000 [IU] | SUBCUTANEOUS | Status: DC
  Administered 2018-10-13 (×4): 6 [IU] via SUBCUTANEOUS
  Administered 2018-10-14: 3 [IU] via SUBCUTANEOUS
  Administered 2018-10-14 (×3): 6 [IU] via SUBCUTANEOUS
  Administered 2018-10-14: 3 [IU] via SUBCUTANEOUS
  Administered 2018-10-14 – 2018-10-15 (×5): 6 [IU] via SUBCUTANEOUS
  Administered 2018-10-16: 01:00:00 3 [IU] via SUBCUTANEOUS
  Administered 2018-10-16 (×3): 6 [IU] via SUBCUTANEOUS
  Administered 2018-10-16 – 2018-10-17 (×3): 3 [IU] via SUBCUTANEOUS
  Administered 2018-10-17 (×2): 6 [IU] via SUBCUTANEOUS

## 2018-10-13 MED ORDER — POTASSIUM CHLORIDE 20 MEQ/15ML (10%) PO SOLN
20.0000 meq | ORAL | Status: AC
  Administered 2018-10-13 (×2): 20 meq
  Filled 2018-10-13 (×2): qty 15

## 2018-10-13 MED ORDER — POLYETHYLENE GLYCOL 3350 17 G PO PACK
17.0000 g | PACK | Freq: Four times a day (QID) | ORAL | Status: DC
  Administered 2018-10-13 – 2018-10-14 (×5): 17 g via ORAL
  Filled 2018-10-13 (×6): qty 1

## 2018-10-13 MED ORDER — GABAPENTIN 100 MG PO CAPS
100.0000 mg | ORAL_CAPSULE | Freq: Three times a day (TID) | ORAL | Status: DC
  Administered 2018-10-13 – 2018-10-17 (×12): 100 mg via ORAL
  Filled 2018-10-13 (×13): qty 1

## 2018-10-13 MED ORDER — FAMOTIDINE 40 MG/5ML PO SUSR
20.0000 mg | Freq: Every day | ORAL | Status: DC
  Administered 2018-10-13 – 2018-10-17 (×5): 20 mg via ORAL
  Filled 2018-10-13 (×5): qty 2.5

## 2018-10-13 MED ORDER — CARVEDILOL 3.125 MG PO TABS
3.1250 mg | ORAL_TABLET | Freq: Two times a day (BID) | ORAL | Status: DC
  Administered 2018-10-13 – 2018-10-16 (×7): 3.125 mg
  Filled 2018-10-13 (×8): qty 1

## 2018-10-13 NOTE — Progress Notes (Signed)
NAME:  [name], MRN:  [id number], DOB:  01/29/1951, LOS: 2 ADMISSION DATE:  10/11/2018, CONSULTATION DATE: October 11, 2018 REFERRING MD: Dr. Criss AlvineGoldston, CHIEF COMPLAINT: Shortness of breath  Brief History   68 year old male with a past medical history significant for diabetes mellitus, peripheral arterial disease and fatty liver came to the [facility] emergency department with shortness of breath.  Was noted to be profoundly hypoxemic. In the ER had a respiratory and cardiac arrest (PEA) lasting 5 minutes.    History of present illness   68 year old male with a past medical history significant for diabetes mellitus, peripheral arterial disease and fatty liver came to the [facility] emergency department with shortness of breath.  Was noted to be profoundly hypoxemic. In the ER had a respiratory and cardiac arrest (PEA) lasting 5 minutes.  After intubation he was noted to be profoundly hypoxemic.  I was unable to obtain history from the patient because he was intubated and sedated.  However, the staff tells me that he was awake and alert after the cardiac arrest and able to interact.  They asked him if there had been people in the home who had been sick and he answered yes.  He did not know if they had tested positive for corona virus.  However, he reported that they had been sick with a cough.  Past Medical History  Fatty liver Peripheral arterial disease Diabetes mellitus type 2 Hypertension  Significant Hospital Events   4/11 - ICU admit   Consults:  4/11 - PCCM  4/12 - diuresis, good UOP  Procedures:  October 11, 2018 endotracheal tube October 11, 2018 right internal jugular central line  Significant Diagnostic Tests:   Micro Data:  April 11 novel coronavirus - pending  April 11 respiratory culture -  pending  April 11 blood culture - NGTD  April 11 respiratory viral panel - negative   Antimicrobials:  April 11 ceftriaxone April 11 azithromycin  Interim history/subjective:   Stable overnight. Continued diuresis. Good UOP.   Objective   Blood pressure 116/73, pulse 69, temperature 99.5 F (37.5 C), temperature source Axillary, resp. rate 17, height 5\' 7"  (1.702 m), weight 106 kg, SpO2 96 %.    Vent Mode: CPAP;PSV FiO2 (%):  [40 %] 40 % Set Rate:  [22 bmp] 22 bmp Vt Set:  [580 mL] 580 mL PEEP:  [8 cmH20] 8 cmH20 Pressure Support:  [8 cmH20-10 cmH20] 10 cmH20 Plateau Pressure:  [17 cmH20-27 cmH20] 20 cmH20   Intake/Output Summary (Last 24 hours) at 10/13/2018 [id number] Last data filed at 10/13/2018 0700 Gross per 24 hour  Intake 1460.66 ml  Output 4550 ml  Net -3089.34 ml   Filed Weights   10/11/18 0910 10/12/18 0500 10/13/18 0447  Weight: 105.2 kg 109.4 kg 106 kg    Examination:  General: intubated, on vent appears comfortable, following commands HENT: NCAT,sclera clear  PULM: BL vented breath sounds  CV: RRR, no mrg  GI: bs, soft, nt nd   MSK: normal bulk and tone, BL LE with edema  Neuro: sedated, comfortable, follows commands, no focal deficit  Resolved Hospital Problem list     Assessment & Plan:   Acute respiratory failure with hypoxemia Likely due to community-acquired pneumonia versus flash pulmonary edema  > on mechanical support  > tolerating SBT this AM  > weaning from vent  > LTVV > VAP prevention > Daily SBT SAT > responding well to lasix   Acute decompensated diastolic heart failure: > additional dose  of diuresis today  >follow upon UOP  >continue coreg and sipronlactone  > held arb   [name]: at baseline > replacing lytes as needed > will follow   Severe community acquired pneumonia: > RVP negative, covid pending  > 5 days of ceftriaxone   DM2 > CBGS + SSI   Fatty liver > supportive care   Cardiac arrest: due to respiratory failure > supportive care, no apparent cognitive deficit    Best practice:  Diet: tf  Pain/Anxiety/Delirium protocol (if indicated): RASS -1 to -2 VAP protocol (if indicated): yes DVT  prophylaxis: lovenox GI prophylaxis: pantoprazole Glucose control: SSI Mobility: bed rest Code Status: full Family Communication: we will attempt again today  Disposition: remain in ICU  Labs   CBC: Recent Labs  Lab 10/11/18 0750 10/11/18 0844 10/11/18 1519 10/12/18 0517 10/12/18 0518 10/13/18 0240  WBC 18.5*  --   --  10.0  --  9.7  NEUTROABS 11.4*  --   --   --   --  8.1*  HGB 11.4* 11.2* 10.5* 9.3* 9.5* 9.7*  HCT 39.2 33.0* 31.0* 30.2* 28.0* 30.2*  MCV 92.2  --   --  88.6  --  87.3  PLT 369  --   --  229  --  247    Basic Metabolic Panel: Recent Labs  Lab 10/11/18 0750  10/11/18 1203  10/11/18 1610 10/12/18 0517 10/12/18 0518 10/12/18 1747 10/12/18 1950 10/13/18 0240  NA 139   < >  --    < > 139 139 138  --  136 139  K 4.3   < >  --    < > 4.6 4.2 4.2  --  3.6 3.5  CL 101  --   --   --  103 105  --   --  100 98  CO2 22  --   --   --  25 26  --   --  28 27  GLUCOSE 335*  --   --   --  202* 183*  --   --  164* 181*  BUN 22  --   --   --  22 27*  --   --  31* 35*  CREATININE 1.49*  --   --   --  1.14 1.25*  --   --  1.31* 1.40*  CALCIUM 8.7*  --   --   --  8.3* 8.2*  --   --  8.1* 8.6*  MG  --   --  1.3*  --  1.3* 2.0  --  2.0 1.9  --   PHOS  --   --  3.6  --  3.7 3.3  --  4.1 3.8  --    < > = values in this interval not displayed.   GFR: Estimated Creatinine Clearance: 58.6 mL/min (A) (by C-G formula based on SCr of 1.4 mg/dL (H)). Recent Labs  Lab 10/11/18 0750 10/11/18 1610 10/12/18 0517 10/13/18 0240  WBC 18.5*  --  10.0 9.7  LATICACIDVEN 5.1* 1.3  --   --     Liver Function Tests: Recent Labs  Lab 10/11/18 0750  AST 32  ALT 35  ALKPHOS 117  BILITOT 0.4  PROT 7.3  ALBUMIN 3.3*   No results for input(s): LIPASE, AMYLASE in the last 168 hours. No results for input(s): AMMONIA in the last 168 hours.  ABG    Component Value Date/Time   PHART 7.441 10/12/2018 0518   PCO2ART 40.1 10/12/2018  0518   PO2ART 90.0 10/12/2018 0518   HCO3 27.3  10/12/2018 0518   TCO2 28 10/12/2018 0518   ACIDBASEDEF 4.0 (H) 10/11/2018 0844   O2SAT 97.0 10/12/2018 0518     Coagulation Profile: Recent Labs  Lab 10/11/18 0750  INR 1.1    Cardiac Enzymes: Recent Labs  Lab 10/11/18 0750  TROPONINI 0.03*    HbA1C: Hgb A1c MFr Bld  Date/Time Value Ref Range Status  10/15/2017 01:15 PM 10.6 (H) 4.8 - 5.6 % Final    Comment:    (NOTE) Pre diabetes:          5.7%-6.4% Diabetes:              >6.4% Glycemic control for   <7.0% adults with diabetes   10/28/2016 07:25 AM 10.1 (H) 4.8 - 5.6 % Final    Comment:    (NOTE)         Pre-diabetes: 5.7 - 6.4         Diabetes: >6.4         Glycemic control for adults with diabetes: <7.0     CBG: Recent Labs  Lab 10/12/18 1953 10/12/18 2318 10/13/18 0242 10/13/18 0814 10/13/18 0823  GLUCAP 163* 197* 180* 73 165*    Review of Systems/family/social  Cannot obtain due to intubation Allergies  Allergen Reactions  . Cashew Nut Oil Palpitations  . Percocet [Oxycodone-Acetaminophen] Other (See Comments)    Hallucintaions     Home Medications  Prior to Admission medications   Medication Sig Start Date End Date Taking? Authorizing Provider  albuterol (PROVENTIL HFA;VENTOLIN HFA) 108 (90 Base) MCG/ACT inhaler Inhale 2 puffs into the lungs every 6 (six) hours as needed for wheezing or shortness of breath. 10/18/17  Yes Erick Blinks, MD  aspirin EC 81 MG tablet Take 81 mg by mouth daily.   Yes [provider]  atorvastatin (LIPITOR) 40 MG tablet Take 1 tablet (40 mg total) by mouth every evening. 06/08/18  Yes Noralee Stain, DO  carvedilol (COREG) 6.25 MG tablet Take 1 tablet (6.25 mg total) by mouth 2 (two) times daily with a meal. 06/08/18  Yes Noralee Stain, DO  finasteride (PROSCAR) 5 MG tablet Take 5 mg by mouth daily.   Yes [provider]  furosemide (LASIX) 20 MG tablet Take 1 tablet (20 mg total) by mouth daily. 06/09/18  Yes Noralee Stain, DO  gabapentin  (NEURONTIN) 300 MG capsule Take 300 mg by mouth 4 (four) times daily.    Yes [provider]  glipiZIDE (GLUCOTROL XL) 5 MG 24 hr tablet Take 5 mg by mouth daily.   Yes [provider]  hydrOXYzine (VISTARIL) 50 MG capsule Take 50 mg by mouth 3 (three) times daily.   Yes [provider]  metFORMIN (GLUCOPHAGE) 1000 MG tablet Take 1,000 mg by mouth 2 (two) times daily. 05/08/18  Yes [provider]  montelukast (SINGULAIR) 10 MG tablet Take 10 mg by mouth at bedtime.   Yes [provider]  Multiple Vitamin (MULTIVITAMIN) tablet Take 1 tablet by mouth daily.   Yes [provider]  pantoprazole (PROTONIX) 40 MG tablet Take 1 tablet (40 mg total) by mouth daily. 10/11/13  Yes Philip Aspen, Limmie Patricia, MD  potassium chloride (K-DUR) 10 MEQ tablet Take 10 mEq by mouth daily.   Yes [provider]  spironolactone (ALDACTONE) 25 MG tablet Take 1 tablet (25 mg total) by mouth daily. 06/09/18  Yes Noralee Stain, DO  tamsulosin (FLOMAX) 0.4 MG CAPS  capsule Take 0.4 mg by mouth daily. 05/03/15  Yes [provider]  valsartan (DIOVAN) 320 MG tablet Take 320 mg by mouth daily.   Yes [provider]     This patient is critically ill with multiple organ system failure; which, requires frequent high complexity decision making, assessment, support, evaluation, and titration of therapies. This was completed through the application of advanced monitoring technologies and extensive interpretation of multiple databases. During this encounter critical care time was devoted to patient care services described in this note for 34 minutes.   Josephine Igo, DO [facility] 10/13/2018 8:43 AM  Personal pager: [phone] If unanswered, please page CCM On-call: [phone]

## 2018-10-13 NOTE — Progress Notes (Signed)
[facility] ADULT ICU REPLACEMENT PROTOCOL FOR AM LAB REPLACEMENT ONLY  The patient does apply for the [facility] Adult ICU Electrolyte Replacment Protocol based on the criteria listed below:   1. Is GFR >/= 40 ml/min? Yes.    Patient's GFR today is 51 2. Is urine output >/= 0.5 ml/kg/hr for the last 6 hours? Yes.   Patient's UOP is 2.02 ml/kg/hr 3. Is BUN < 60 mg/dL? Yes.    Patient's BUN today is 35 4. Abnormal electrolyte(s): Potassium 3.5 5. Ordered repletion with: Potassium per protocol 6. If a panic level lab has been reported, has the CCM MD in charge been notified? No..   Physician  Orland Dec P 10/13/2018 5:21 AM

## 2018-10-13 NOTE — Progress Notes (Signed)
eLink Physician-Brief Progress Note Patient Name: [name] DOB: Feb 28, 1951 MRN: [id number]   Date of Service  10/13/2018  HPI/Events of Note  Restraints renewal request. Camera: On Vent, looks calm. Eyes are open. Awake,, not alert.  eICU Interventions  - Restraints renewed to prevent self injury and harm while on Vent from agitation, anxiety.      Intervention Category Minor Interventions: Agitation / anxiety - evaluation and management  Ranee Gosselin 10/13/2018, 7:42 PM

## 2018-10-14 DIAGNOSIS — J810 Acute pulmonary edema: Secondary | ICD-10-CM

## 2018-10-14 DIAGNOSIS — N183 Chronic kidney disease, stage 3 (moderate): Secondary | ICD-10-CM

## 2018-10-14 LAB — NOVEL CORONAVIRUS, NAA (HOSP ORDER, SEND-OUT TO REF LAB; TAT 18-24 HRS): SARS-CoV-2, NAA: NOT DETECTED

## 2018-10-14 LAB — CBC
HCT: 30.7 % — ABNORMAL LOW (ref 39.0–52.0)
Hemoglobin: 9.8 g/dL — ABNORMAL LOW (ref 13.0–17.0)
MCH: 28.2 pg (ref 26.0–34.0)
MCHC: 31.9 g/dL (ref 30.0–36.0)
MCV: 88.2 fL (ref 80.0–100.0)
Platelets: 243 10*3/uL (ref 150–400)
RBC: 3.48 MIL/uL — ABNORMAL LOW (ref 4.22–5.81)
RDW: 15.2 % (ref 11.5–15.5)
WBC: 11.4 10*3/uL — ABNORMAL HIGH (ref 4.0–10.5)
nRBC: 0 % (ref 0.0–0.2)

## 2018-10-14 LAB — BASIC METABOLIC PANEL
Anion gap: 12 (ref 5–15)
BUN: 34 mg/dL — ABNORMAL HIGH (ref 8–23)
CO2: 28 mmol/L (ref 22–32)
Calcium: 8.6 mg/dL — ABNORMAL LOW (ref 8.9–10.3)
Chloride: 99 mmol/L (ref 98–111)
Creatinine, Ser: 1.35 mg/dL — ABNORMAL HIGH (ref 0.61–1.24)
GFR calc Af Amer: >60 mL/min (ref >60)
GFR calc non Af Amer: 54 mL/min — ABNORMAL LOW (ref >60)
Glucose, Bld: 156 mg/dL — ABNORMAL HIGH (ref 70–99)
Potassium: 4.1 mmol/L (ref 3.5–5.1)
Sodium: 139 mmol/L (ref 135–145)

## 2018-10-14 LAB — GLUCOSE, CAPILLARY
Glucose-Capillary: 132 mg/dL — ABNORMAL HIGH (ref 70–99)
Glucose-Capillary: 140 mg/dL — ABNORMAL HIGH (ref 70–99)
Glucose-Capillary: 143 mg/dL — ABNORMAL HIGH (ref 70–99)
Glucose-Capillary: 156 mg/dL — ABNORMAL HIGH (ref 70–99)
Glucose-Capillary: 164 mg/dL — ABNORMAL HIGH (ref 70–99)
Glucose-Capillary: 179 mg/dL — ABNORMAL HIGH (ref 70–99)
Glucose-Capillary: 181 mg/dL — ABNORMAL HIGH (ref 70–99)

## 2018-10-14 MED ORDER — SODIUM CHLORIDE 0.9 % IV SOLN
INTRAVENOUS | Status: DC | PRN
  Administered 2018-10-14: 1000 mL via INTRAVENOUS

## 2018-10-14 MED ORDER — BISACODYL 10 MG RE SUPP
10.0000 mg | Freq: Once | RECTAL | Status: AC
  Administered 2018-10-14: 11:00:00 10 mg via RECTAL
  Filled 2018-10-14: qty 1

## 2018-10-14 MED ORDER — FENTANYL CITRATE (PF) 100 MCG/2ML IJ SOLN
25.0000 ug | INTRAMUSCULAR | Status: DC | PRN
  Administered 2018-10-14 – 2018-10-15 (×4): 25 ug via INTRAVENOUS
  Filled 2018-10-14 (×4): qty 2

## 2018-10-14 MED ORDER — ORAL CARE MOUTH RINSE
15.0000 mL | Freq: Two times a day (BID) | OROMUCOSAL | Status: DC
  Administered 2018-10-14 – 2018-10-17 (×6): 15 mL via OROMUCOSAL

## 2018-10-14 MED ORDER — FUROSEMIDE 10 MG/ML IJ SOLN
40.0000 mg | Freq: Three times a day (TID) | INTRAMUSCULAR | Status: AC
  Administered 2018-10-14 (×2): 40 mg via INTRAVENOUS
  Filled 2018-10-14: qty 4

## 2018-10-14 MED ORDER — FUROSEMIDE 10 MG/ML IJ SOLN
40.0000 mg | INTRAMUSCULAR | Status: DC
  Filled 2018-10-14: qty 4

## 2018-10-14 MED ORDER — FENTANYL CITRATE (PF) 100 MCG/2ML IJ SOLN
25.0000 ug | INTRAMUSCULAR | Status: DC | PRN
  Filled 2018-10-14: qty 2

## 2018-10-14 MED ORDER — FENTANYL CITRATE (PF) 100 MCG/2ML IJ SOLN
25.0000 ug | INTRAMUSCULAR | Status: DC | PRN
  Administered 2018-10-14: 25 ug via INTRAVENOUS

## 2018-10-14 NOTE — Progress Notes (Addendum)
Received pt to room 533 via bed.  No acute distress noted. Pt c/o chest pain from where CPR was done day of admission 8/10 but stated that is was tolerable and that they had just given him pain meds at 1917. Pt placed on monitor which is showing NSR in the 80's with a rare PVC. VSS as per flow sheet. Skin is intact.  BLE with 3+ edema and discolored from venous stasis per MD notes. O2 is at 4 L/M  Via nasal cannula with humidity added. Foley catheter patent and draining clear yellow urine to gravity.  R IJ  Saline locked.  L wrist saline lock noted.

## 2018-10-14 NOTE — Procedures (Signed)
Extubation Procedure Note  Patient Details:   Name: [name] DOB: 1950/09/13 MRN: [id number]   Airway Documentation:    Vent end date: 10/14/18 Vent end time: 1045   Evaluation  O2 sats: stable throughout Complications: No apparent complications Patient did tolerate procedure well. Bilateral Breath Sounds: Diminished   Yes  RT extubated patient to 4L [place] with RN at bedside per MD order. Positive cuff leak noted. Patient states that his breathing is comfortable and patient is not in distress. No stridor noted. RT will continue to monitor as needed.   Lura Em 10/14/2018, 10:54 AM

## 2018-10-14 NOTE — Progress Notes (Signed)
Pt transported to 5W33 with no adverse events.

## 2018-10-14 NOTE — Progress Notes (Signed)
NAME:  [name], MRN:  [id number], DOB:  1950/08/20, LOS: 3 ADMISSION DATE:  10/11/2018, CONSULTATION DATE: October 11, 2018 REFERRING MD: Dr. Criss Alvine, CHIEF COMPLAINT: Shortness of breath  Brief History   68 year old male with a past medical history significant for diabetes mellitus, peripheral arterial disease and fatty liver came to the [facility] emergency department with shortness of breath.  Was noted to be profoundly hypoxemic. In the ER had a respiratory and cardiac arrest (PEA) lasting 5 minutes.    History of present illness   68 year old male with a past medical history significant for diabetes mellitus, peripheral arterial disease and fatty liver came to the [facility] emergency department with shortness of breath.  Was noted to be profoundly hypoxemic. In the ER had a respiratory and cardiac arrest (PEA) lasting 5 minutes.  After intubation he was noted to be profoundly hypoxemic.  I was unable to obtain history from the patient because he was intubated and sedated.  However, the staff tells me that he was awake and alert after the cardiac arrest and able to interact.  They asked him if there had been people in the home who had been sick and he answered yes.  He did not know if they had tested positive for corona virus.  However, he reported that they had been sick with a cough.  Past Medical History  Fatty liver Peripheral arterial disease Diabetes mellitus type 2 Hypertension  Significant Hospital Events   4/11 - ICU admit   Consults:  4/11 - PCCM  4/12 - diuresis, good UOP  Procedures:  October 11, 2018 endotracheal tube October 11, 2018 right internal jugular central line  Significant Diagnostic Tests:   Micro Data:  April 11 novel coronavirus - pending  April 11 respiratory culture -  NGTD April 11 blood culture - NGTD  April 11 respiratory viral panel - negative   Antimicrobials:  April 11 ceftriaxone - complete 5 days  April 11 azithromycin - stopped   Interim history/subjective:  No issues overnight. On sbt this am. Appears comfortable   Objective   Blood pressure 118/73, pulse 83, temperature 97.8 F (36.6 C), temperature source Oral, resp. rate 19, height  (1.702 m), weight 106.4 kg, SpO2 92 %.    Vent Mode: CPAP;PSV FiO2 (%):  [40 %] 40 % Set Rate:  [22 bmp] 22 bmp Vt Set:  [580 mL] 580 mL PEEP:  [5 cmH20-8 cmH20] 5 cmH20 Pressure Support:  [8 cmH20-10 cmH20] 10 cmH20 Plateau Pressure:  [19 cmH20-22 cmH20] 22 cmH20   Intake/Output Summary (Last 24 hours) at 10/14/2018 1204 Last data filed at 10/14/2018 1000 Gross per 24 hour  Intake 1047.3 ml  Output 2270 ml  Net -1222.7 ml   Filed Weights   10/12/18 0500 10/13/18 0447 10/14/18 0433  Weight: 109.4 kg 106 kg 106.4 kg    Examination:  General: intubated, on vent, comfortable following commands  HENT: NCAT, sclera clear  PULM: bilateral vented breaths  CV: regular, sinus on tele GI: BS, soft, nt nd  MSK: BL LE edema  Neuro: propfol off, following commands, appears comfortable   Resolved Hospital Problem list     Assessment & Plan:   Acute respiratory failure with hypoxemia Likely due to community-acquired pneumonia versus pulmonary edema  I suspect this is pulmonary edema due to the quick resolution  >patient remains on mv, tolerating SBT this AM > I suspect we can attempt liberation from vent this morning  > he had additional good  urine output  > we will schedule additional diuresis today   Acute decompensated diastolic heart failure: > additional diuresis  >follow UOP > continue coreg and spirnolactone  > holding ARB  [name]: at baseline > following UOP   Severe community acquired pneumonia: > RVP negative > COVID pending  > 5 days of ceftriaxone   DM2 > CBGS + SSI for coverage   Fatty liver > supportive   Cardiac arrest: due to respiratory failure > supportive care  > pain control for sore chest    Best practice:  Diet: tf   Pain/Anxiety/Delirium protocol (if indicated): RASS -1 to -2 VAP protocol (if indicated): yes DVT prophylaxis: lovenox GI prophylaxis: pantoprazole Glucose control: SSI Mobility: bed rest Code Status: full Family Communication: we will attempt again today  Disposition: remain in ICU  Labs   CBC: Recent Labs  Lab 10/11/18 0750  10/11/18 1519 10/12/18 0517 10/12/18 0518 10/13/18 0240 10/14/18 0458  WBC 18.5*  --   --  10.0  --  9.7 11.4*  NEUTROABS 11.4*  --   --   --   --  8.1*  --   HGB 11.4*   < > 10.5* 9.3* 9.5* 9.7* 9.8*  HCT 39.2   < > 31.0* 30.2* 28.0* 30.2* 30.7*  MCV 92.2  --   --  88.6  --  87.3 88.2  PLT 369  --   --  229  --  247 243   < > = values in this interval not displayed.    Basic Metabolic Panel: Recent Labs  Lab 10/11/18 1203  10/11/18 1610 10/12/18 0517 10/12/18 0518 10/12/18 1747 10/12/18 1950 10/13/18 0240 10/13/18 1015 10/14/18 0458  NA  --    < > 139 139 138  --  136 139  --  139  K  --    < > 4.6 4.2 4.2  --  3.6 3.5 4.0 4.1  CL  --   --  103 105  --   --  100 98  --  99  CO2  --   --  25 26  --   --  28 27  --  28  GLUCOSE  --   --  202* 183*  --   --  164* 181*  --  156*  BUN  --   --  22 27*  --   --  31* 35*  --  34*  CREATININE  --   --  1.14 1.25*  --   --  1.31* 1.40*  --  1.35*  CALCIUM  --   --  8.3* 8.2*  --   --  8.1* 8.6*  --  8.6*  MG 1.3*  --  1.3* 2.0  --  2.0 1.9  --   --   --   PHOS 3.6  --  3.7 3.3  --  4.1 3.8  --   --   --    < > = values in this interval not displayed.   GFR: Estimated Creatinine Clearance: 60.9 mL/min (A) (by C-G formula based on SCr of 1.35 mg/dL (H)). Recent Labs  Lab 10/11/18 0750 10/11/18 1610 10/12/18 0517 10/13/18 0240 10/14/18 0458  WBC 18.5*  --  10.0 9.7 11.4*  LATICACIDVEN 5.1* 1.3  --   --   --     Liver Function Tests: Recent Labs  Lab 10/11/18 0750  AST 32  ALT 35  ALKPHOS 117  BILITOT 0.4  PROT 7.3  ALBUMIN 3.3*   No results for input(s): LIPASE, AMYLASE in the  last 168 hours. No results for input(s): AMMONIA in the last 168 hours.  ABG    Component Value Date/Time   PHART 7.441 10/12/2018 0518   PCO2ART 40.1 10/12/2018 0518   PO2ART 90.0 10/12/2018 0518   HCO3 27.3 10/12/2018 0518   TCO2 28 10/12/2018 0518   ACIDBASEDEF 4.0 (H) 10/11/2018 0844   O2SAT 97.0 10/12/2018 0518     Coagulation Profile: Recent Labs  Lab 10/11/18 0750  INR 1.1    Cardiac Enzymes: Recent Labs  Lab 10/11/18 0750  TROPONINI 0.03*    HbA1C: Hgb A1c MFr Bld  Date/Time Value Ref Range Status  10/15/2017 01:15 PM 10.6 (H) 4.8 - 5.6 % Final    Comment:    (NOTE) Pre diabetes:          5.7%-6.4% Diabetes:              >6.4% Glycemic control for   <7.0% adults with diabetes   10/28/2016 07:25 AM 10.1 (H) 4.8 - 5.6 % Final    Comment:    (NOTE)         Pre-diabetes: 5.7 - 6.4         Diabetes: >6.4         Glycemic control for adults with diabetes: <7.0     CBG: Recent Labs  Lab 10/13/18 1950 10/14/18 0026 10/14/18 0427 10/14/18 0741 10/14/18 1138  GLUCAP 163* 164* 156* 132* 179*    This patient is critically ill with multiple organ system failure; which, requires frequent high complexity decision making, assessment, support, evaluation, and titration of therapies. This was completed through the application of advanced monitoring technologies and extensive interpretation of multiple databases. During this encounter critical care time was devoted to patient care services described in this note for 36 minutes.   Josephine Igo, DO [facility] 10/14/2018 12:12 PM  Personal pager: [phone] If unanswered, please page CCM On-call: #[phone]

## 2018-10-15 ENCOUNTER — Inpatient Hospital Stay (HOSPITAL_COMMUNITY): Payer: Medicare Other

## 2018-10-15 DIAGNOSIS — I351 Nonrheumatic aortic (valve) insufficiency: Secondary | ICD-10-CM

## 2018-10-15 DIAGNOSIS — N179 Acute kidney failure, unspecified: Secondary | ICD-10-CM

## 2018-10-15 DIAGNOSIS — E785 Hyperlipidemia, unspecified: Secondary | ICD-10-CM

## 2018-10-15 DIAGNOSIS — I5033 Acute on chronic diastolic (congestive) heart failure: Secondary | ICD-10-CM

## 2018-10-15 DIAGNOSIS — D649 Anemia, unspecified: Secondary | ICD-10-CM

## 2018-10-15 DIAGNOSIS — E119 Type 2 diabetes mellitus without complications: Secondary | ICD-10-CM

## 2018-10-15 DIAGNOSIS — I5041 Acute combined systolic (congestive) and diastolic (congestive) heart failure: Secondary | ICD-10-CM

## 2018-10-15 LAB — GLUCOSE, CAPILLARY
Glucose-Capillary: 153 mg/dL — ABNORMAL HIGH (ref 70–99)
Glucose-Capillary: 117 mg/dL — ABNORMAL HIGH (ref 70–99)
Glucose-Capillary: 181 mg/dL — ABNORMAL HIGH (ref 70–99)
Glucose-Capillary: 178 mg/dL — ABNORMAL HIGH (ref 70–99)
Glucose-Capillary: 170 mg/dL — ABNORMAL HIGH (ref 70–99)

## 2018-10-15 MED ORDER — FUROSEMIDE 10 MG/ML IJ SOLN
40.0000 mg | Freq: Once | INTRAMUSCULAR | Status: AC
  Administered 2018-10-15: 40 mg via INTRAVENOUS
  Filled 2018-10-15: qty 4

## 2018-10-15 MED ORDER — HYDROCODONE-ACETAMINOPHEN 5-325 MG PO TABS
1.0000 | ORAL_TABLET | Freq: Four times a day (QID) | ORAL | Status: DC | PRN
  Administered 2018-10-15 – 2018-10-17 (×4): 1 via ORAL
  Filled 2018-10-15 (×4): qty 1

## 2018-10-15 MED ORDER — ENOXAPARIN SODIUM 40 MG/0.4ML ~~LOC~~ SOLN
40.0000 mg | SUBCUTANEOUS | Status: DC
  Administered 2018-10-16 – 2018-10-17 (×2): 40 mg via SUBCUTANEOUS
  Filled 2018-10-15 (×2): qty 0.4

## 2018-10-15 MED ORDER — MORPHINE SULFATE (PF) 2 MG/ML IV SOLN: 2.0000 mg | INTRAVENOUS | Status: DC | PRN

## 2018-10-15 MED ORDER — FUROSEMIDE 40 MG PO TABS
40.0000 mg | ORAL_TABLET | Freq: Every day | ORAL | Status: DC
  Administered 2018-10-16 – 2018-10-17 (×2): 40 mg via ORAL
  Filled 2018-10-15 (×2): qty 1

## 2018-10-15 NOTE — Consult Note (Signed)
Cardiology Consultation:   Waller ID: [name] MRN: [id number]; DOB: Mar 17, 1951  Admit date: 10/11/2018 Date of Consult: 10/15/2018  Primary Care Provider: [facility], [facility], MD (Inactive) Primary Cardiologist: Tonny Bollman, MD  Primary Electrophysiologist:  None   Waller Profile:   [name] is a 68 y.o. male with a hx of HTN, HL, DM, AAA, mild AI and mild non-obstructive CAD who is being seen today for Harold evaluation of acute HF at Harold request of Dr. Neoma Laming.  History of Present Illness:   [name] is a 68 yo male with PMH noted above. He underwent cardiac cath in 9/12 after presenting with chest pain. This showed LV function and mild non-obstructive CAD with mild AI. Last seen in consultation for chest pain in 2015 after presenting to [facility]. Ruled out and underwent lexiscan myoview with no prior infarction or ischemia noted. Most recent echo from 12/19 showed EF of 45-50% with no rWMA, g2DD, mild mitral regurg and mildly dilated LA. He tells me he was seen by Dr. Jacinto Halim for a brief period after being referred by his PCP. Thinks he had a CT scan, result were normal per Waller's report.  Last admission was back in 12/19 when he presented with acute respiratory failure and required intubation in Harold setting of sepsis and multilobar CAP/ACHF.  He was admitted from Harold ED on 4/11 with acute respiratory failure, and PEA arrest. Presented with several days of shortness of breath and found to be profoundly hypoxic. In talking with [name] over Harold phone he reported he had also had some chest pain Harold day prior to admission. He has been very compliant with his medications including his lasix dosing. While in Harold ED he decompensated, and developed PEA arrest lasting 5 minutes. Admission labs were notable for mild hypokalemia 3.3, BNP 342, Trop 0.03, Lactic Acid 5.1, WBC 18.5. CXR concerning for bilateral PNA, without evidence of edema. EKG showed  junctional tachy rhythm with known LBBB.    He was admitted to [facility] for further management. COVID neg, Respiratory panel and BC neg. Treated with 5 days of ceftriaxone. He was diuresed with IV lasix. Currently net - 6.6L. Weight down 241>>234.5. He was extubated on 4/14 and transferred to telemetry floor. In talking with him he is having chest pain at time, but felt this was related to Harold CPR done while in Harold ED. Reproducible if he moves. His respiratory status is much improved and able to hold a conversation without feeling short of breath.   Past Medical History:  Diagnosis Date  . 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 Diabetes mellitus 10/17/2011   newly dx today  . Edema leg    right leg has leaky valve and right foot swells  . Emphysema   . Excessive ear wax   . Fatty liver   . Hernia    near navel  . Hyperlipidemia   . Hypertension   . Leaky heart valve   . Neuropathy   . Rheumatic fever   . Slow urinary stream   . TIA (transient ischemic attack)     Past Surgical History:  Procedure Laterality Date  . CIRCUMCISION    . LITHOTRIPSY       Home Medications:  Prior to Admission medications   Medication Sig Start Date End Date Taking? Authorizing Provider  albuterol (PROVENTIL HFA;VENTOLIN HFA) 108 (90 Base) MCG/ACT inhaler Inhale 2 puffs into Harold lungs every 6 (six) hours as  needed for wheezing or shortness of breath. 10/18/17  Yes Erick Blinks, MD  aspirin EC 81 MG tablet Take 81 mg by mouth daily.   Yes [provider]  atorvastatin (LIPITOR) 40 MG tablet Take 1 tablet (40 mg total) by mouth every evening. 06/08/18  Yes Noralee Stain, DO  carvedilol (COREG) 6.25 MG tablet Take 1 tablet (6.25 mg total) by mouth 2 (two) times daily with a meal. 06/08/18  Yes Noralee Stain, DO  finasteride (PROSCAR) 5 MG tablet Take 5 mg by mouth daily.   Yes [provider]  furosemide (LASIX) 20 MG tablet Take 1 tablet (20 mg total) by mouth daily. 06/09/18  Yes Noralee Stain, DO  gabapentin (NEURONTIN) 300 MG capsule Take 300 mg by mouth 4 (four) times daily.    Yes [provider]  glipiZIDE (GLUCOTROL XL) 5 MG 24 hr tablet Take 5 mg by mouth daily.   Yes [provider]  hydrOXYzine (VISTARIL) 50 MG capsule Take 50 mg by mouth 3 (three) times daily.   Yes [provider]  metFORMIN (GLUCOPHAGE) 1000 MG tablet Take 1,000 mg by mouth 2 (two) times daily. 05/08/18  Yes [provider]  montelukast (SINGULAIR) 10 MG tablet Take 10 mg by mouth at bedtime.   Yes [provider]  Multiple Vitamin (MULTIVITAMIN) tablet Take 1 tablet by mouth daily.   Yes [provider]  pantoprazole (PROTONIX) 40 MG tablet Take 1 tablet (40 mg total) by mouth daily. 10/11/13  Yes Philip Aspen, Limmie Patricia, MD  potassium chloride (K-DUR) 10 MEQ tablet Take 10 mEq by mouth daily.   Yes [provider]  spironolactone (ALDACTONE) 25 MG tablet Take 1 tablet (25 mg total) by mouth daily. 06/09/18  Yes Noralee Stain, DO  tamsulosin (FLOMAX) 0.4 MG CAPS capsule Take 0.4 mg by mouth daily. 05/03/15  Yes [provider]  valsartan (DIOVAN) 320 MG tablet Take 320 mg by mouth daily.   Yes [provider]    Inpatient Medications: Scheduled Meds: . aspirin  81 mg Oral Daily  . atorvastatin  40 mg Per Tube QPM  . carvedilol  3.125 mg Per Tube BID WC  . enoxaparin (LOVENOX) injection  40 mg Subcutaneous Q24H  . famotidine  20 mg Oral Daily  . finasteride  5 mg Oral Daily  . furosemide  40 mg Intravenous Once  . gabapentin  100 mg Oral TID  . insulin aspart  3-9 Units Subcutaneous Q4H  . mouth rinse  15 mL Mouth Rinse BID  . montelukast  10 mg Per Tube QHS  . multivitamin with minerals  1 tablet Per Tube Daily  . spironolactone  25 mg Per Tube Daily  . tamsulosin  0.4 mg Oral Daily   Continuous Infusions: . sodium chloride 10 mL/hr at 10/14/18 1900  . cefTRIAXone (ROCEPHIN)  IV 2 g (10/15/18 1005)    PRN Meds: sodium chloride, albuterol, HYDROcodone-acetaminophen, morphine injection, ondansetron (ZOFRAN) IV  Allergies:    Allergies  Allergen Reactions  . Cashew Nut Oil Palpitations  . Percocet [Oxycodone-Acetaminophen] Other (See Comments)    Hallucintaions    Social History:   Social History   Socioeconomic History  . Marital status: Married    Spouse name: Not on file  . Number of children: Not on file  . Years of education: Not on file  . Highest education level: Not on file  Occupational History  . Not on file  Social Needs  . Financial resource strain:  Not very hard  . Food insecurity:    Worry: Never true    Inability: Never true  . Transportation needs:    Medical: Waller refused    Non-medical: Waller refused  Tobacco Use  . Smoking status: Never Smoker  . Smokeless tobacco: Never Used  Substance and Sexual Activity  . Alcohol use: No  . Drug use: No  . Sexual activity: Not Currently    Partners: Female    Birth control/protection: None  Lifestyle  . Physical activity:    Days per week: 0 days    Minutes per session: 0 min  . Stress: Not at all  Relationships  . Social connections:    Talks on phone: Waller refused    Gets together: Waller refused    Attends religious service: Waller refused    Active member of club or organization: Waller refused    Attends meetings of clubs or organizations: Waller refused    Relationship status: Waller refused  . Intimate partner violence:    Fear of current or ex partner: No    Emotionally abused: No    Physically abused: No    Forced sexual activity: No  Other Topics Concern  . Not on file  Social History Narrative   Waller lives in private residence with supportive spouse    Family History:    Family History  Problem Relation Age of Onset  . Emphysema Mother   . Aneurysm Mother   . Emphysema Father   . Coronary artery disease Brother   . Coronary artery disease Brother   . Coronary  artery disease Sister      ROS:  Please see Harold history of present illness.   All other ROS reviewed and negative.     Physical Exam/Data:   Vitals:   10/14/18 2327 10/15/18 0400 10/15/18 0839 10/15/18 1009  BP: 129/80 120/73 (!) 144/78   Pulse: 79 78 81   Resp: 18 16 (!) 21   Temp: 98.1 F (36.7 C) 97.9 F (36.6 C)  98.3 F (36.8 C)  TempSrc: Oral Axillary  Oral  SpO2: 98% 97% 96%   Weight:      Height:        Intake/Output Summary (Last 24 hours) at 10/15/2018 1026 Last data filed at 10/15/2018 0945 Gross per 24 hour  Intake 1049.87 ml  Output 3000 ml  Net -1950.13 ml   Last 3 Weights 10/14/2018 10/13/2018 10/12/2018  Weight (lbs) 234 lb 9.1 oz 233 lb 11 oz 241 lb 2.9 oz  Weight (kg) 106.4 kg 106 kg 109.4 kg     Body mass index is 36.74 kg/m.   Physical Exam per MD. General:  Well nourished, well developed, morbidly obese male in no acute distress HEENT: normal Lymph: no adenopathy Neck: no JVD Endocrine:  No thryomegaly Vascular: No carotid bruits; FA pulses 2+ bilaterally  Cardiac:  normal S1, S2; RRR; no murmur - distant heart sounds Lungs: Diminished breath sounds in Harold bases bilaterally Abd: soft, nontender, no hepatomegaly, obese Ext: Trace bilateral pretibial edema, chronic stasis dermatitis Musculoskeletal:  No deformities, BUE and BLE strength normal and equal Skin: warm and dry  Neuro:  CNs 2-12 intact, no focal abnormalities noted Psych:  Normal affect   EKG:  Harold EKG was personally reviewed and demonstrates:  Junctional Tach with LBBB  Telemetry:  Telemetry was personally reviewed and demonstrates:  Normal sinus rhythm  Relevant CV Studies:  TTE: 12/19  Study Conclusions  - Left ventricle: Harold cavity  size was normal. Systolic function was   mildly reduced. Harold estimated ejection fraction was in Harold range   of 45% to 50%. Wall motion was normal; there were no regional   wall motion abnormalities. Features are consistent with a    pseudonormal left ventricular filling pattern, with concomitant   abnormal relaxation and increased filling pressure (grade 2   diastolic dysfunction). - Aortic valve: Moderate focal calcification involving Harold   noncoronary cusp. Noncoronary cusp mobility was mildly   restricted. There was moderate regurgitation. Valve area (VTI):   2.21 cm^2. Valve area (Vmax): 2.33 cm^2. Valve area (Vmean): 2.33   cm^2. - Mitral valve: There was mild regurgitation. - Left atrium: Harold atrium was mildly dilated. - Pulmonary arteries: Systolic pressure was mildly increased. PA   peak pressure: 35 mm Hg (S).   Laboratory Data:  Chemistry Recent Labs  Lab 10/12/18 1950 10/13/18 0240 10/13/18 1015 10/14/18 0458  NA 136 139  --  139  K 3.6 3.5 4.0 4.1  CL 100 98  --  99  CO2 28 27  --  28  GLUCOSE 164* 181*  --  156*  BUN 31* 35*  --  34*  CREATININE 1.31* 1.40*  --  1.35*  CALCIUM 8.1* 8.6*  --  8.6*  GFRNONAA 56* 51*  --  54*  GFRAA >60 59*  --  >60  ANIONGAP 8 14  --  12    Recent Labs  Lab 10/11/18 0750  PROT 7.3  ALBUMIN 3.3*  AST 32  ALT 35  ALKPHOS 117  BILITOT 0.4   Hematology Recent Labs  Lab 10/12/18 0517 10/12/18 0518 10/13/18 0240 10/14/18 0458  WBC 10.0  --  9.7 11.4*  RBC 3.41*  --  3.46* 3.48*  HGB 9.3* 9.5* 9.7* 9.8*  HCT 30.2* 28.0* 30.2* 30.7*  MCV 88.6  --  87.3 88.2  MCH 27.3  --  28.0 28.2  MCHC 30.8  --  32.1 31.9  RDW 15.3  --  15.5 15.2  PLT 229  --  247 243   Cardiac Enzymes Recent Labs  Lab 10/11/18 0750  TROPONINI 0.03*   No results for input(s): TROPIPOC in Harold last 168 hours.  BNP Recent Labs  Lab 10/11/18 0755  BNP 342.4*    DDimer No results for input(s): DDIMER in Harold last 168 hours.  Radiology/Studies:  Dg Chest Port 1 View  Result Date: 10/12/2018 CLINICAL DATA:  Follow-up chest x-ray.  + Covid EXAM: PORTABLE CHEST 1 VIEW COMPARISON:  Chest x-rays dated 10/11/2018 and 06/04/2018. FINDINGS: Improved aeration within Harold RIGHT  lung compared to yesterday's exam, although persistent dense opacities within Harold lower lungs bilaterally. Probable small bilateral pleural effusions. Heart size and mediastinal contours appear stable. Endotracheal tube, enteric tube and RIGHT IJ central line appear adequately positioned. No acute appearing osseous abnormality. IMPRESSION: 1. Improved aeration within Harold RIGHT lung compared to yesterday's exam, although persistent dense opacities within Harold lower lungs bilaterally. 2. Probable small bilateral pleural effusions. 3. Support apparatus appears appropriately positioned. Electronically Signed   By: Bary RichardStan  Maynard M.D.   On: 10/12/2018 13:05   Dg Abd Portable 1v  Result Date: 10/11/2018 CLINICAL DATA:  OG tube placement. EXAM: PORTABLE ABDOMEN - 1 VIEW COMPARISON:  None. FINDINGS: Enteric tube appears adequately positioned in Harold stomach with tip directed towards Harold stomach body/fundus. Dilated gas-filled bowel loops again appreciated within Harold mid and lower abdomen, similar to previous exams. IMPRESSION: Enteric tube appears adequately positioned in  Harold stomach. Electronically Signed   By: Bary Richard M.D.   On: 10/11/2018 13:56    Assessment and Plan:   [name] is a 68 y.o. male with a hx of HTN, HL, DM, AAA, mild AI and mild non-obstructive CAD who is being seen today for Harold evaluation of acute HF at Harold request of Dr. Neoma Laming.  1. Acute respiratory failure 2/2 to CAP/pulmonary edema: now extubated and transferred to floor. Completed course of antibiotics. Has had good diuresis with IV lasix. Transfer of care to [facility] on 4/15.  2. PEA arrest: brief while in Harold ED. Seems to have been 2/2 to hypoxia/respiratory failure. Discuss with MD regarding whether further ischemic work up is warranted.   3. Acute on Chronic combined HF: Last echo 12/19 EF 45% with g2DD. He reports compliance with his home medications including lasix PTA. Good diuresis with IV lasix and weight is trending  down. He reports breathing is significantly improved. -- continue IV lasix today -- on coreg and spiro. (on valsartan prior to admission).  4. HL: on statin therapy. No recent lipid panel -- lipids in am  5. DM: Metformin held, SSI while inpatient -- Hgb A1c 10.6 back in April 2019. Recheck this admission  6. AKI: Cr 1.49 on admission>>1.35. Home ARB held at this time with diuresis. -- follow BMET  For questions or updates, please contact [facility] Please consult [email or web address] for contact info under   Signed, Laverda Page, NP  10/15/2018 10:26 AM   Waller seen, examined. Available data reviewed. Agree with findings, assessment, and plan as outlined by Laverda Page, NP.  [name] is independently interviewed and examined.  Harold physical examination findings outlined above reflect my personal exam findings today.  [name] has had recurrent respiratory arrests followed by PEA.  Comorbid conditions include morbid obesity, question of aspiration pneumonia/pneumonitis, and probable acute pulmonary edema.  I reviewed [name]'s cardiac history and he has undergone cardiac catheterization in 2012 demonstrating only minor nonobstructive coronary disease. [name] has not had anginal chest pain.  His troponin was checked during this admission and was only 0.03.  His last echocardiogram demonstrated an LVEF of 45%.  It seems reasonable to continue to treat [name] medically for acute on chronic combined systolic and diastolic heart failure with an oral loop diuretic (already received IV furosemide today), spironolactone, and carvedilol.  I think [name]'s echocardiogram should be updated since he has had a recurrent arrest to make sure there is not a significant change in his LV function which might warrant further ischemic evaluation.  Otherwise I would be inclined to evaluate him further as an outpatient once he is fully recovered.  We will follow with you after his  echocardiogram is completed with further recommendations for his medical management.  Tonny Bollman, M.D. 10/15/2018 4:13 PM

## 2018-10-15 NOTE — Progress Notes (Addendum)
[facility] PROGRESS NOTE  Loris Winrow ZOX:[id number] DOB: 14-Apr-1951 DOA: 10/11/2018  PCP: [facility], Medical, MD (Inactive)  Brief History/Interval Summary: 68 year old male with a past medical history significant for diabetes mellitus, peripheral arterial disease and fatty liver presented to the [facility] emergency department with shortness of breath.  Was noted to be profoundly hypoxemic. In the ER had a respiratory and cardiac arrest (PEA) lasting 5 minutes.  After intubation he was noted to be profoundly hypoxemic.    Reason for Visit: Acute respiratory failure with hypoxia.  Consultants: Patient was on critical care medicine service.  Cardiology consulted today.  Procedures:  October 11, 2018 endotracheal tube October 11, 2018 right internal jugular central line Foley catheter.  Micro Data:  April 11 novel coronavirus - Negative April 11 respiratory culture -  NGTD April 11 blood culture - NGTD  April 11 respiratory viral panel - negative   Antimicrobials:  April 11 ceftriaxone - complete 5 days  April 11 azithromycin - stopped   Subjective/Interval History: Patient denies any headaches.  Some shortness of breath but better than before.  Some soreness in his chest from the chest compressions he received with CPR.  ROS: Denies any nausea or vomiting.  Objective:  Vital Signs  Vitals:   10/15/18 0400 10/15/18 0839 10/15/18 1009 10/15/18 1203  BP: 120/73 (!) 144/78    Pulse: 78 81    Resp: 16 (!) 21    Temp: 97.9 F (36.6 C)  98.3 F (36.8 C) 98 F (36.7 C)  TempSrc: Axillary  Oral Oral  SpO2: 97% 96%    Weight:      Height:        Intake/Output Summary (Last 24 hours) at 10/15/2018 1321 Last data filed at 10/15/2018 1200 Gross per 24 hour  Intake 1119.87 ml  Output 2400 ml  Net -1280.13 ml   Filed Weights   10/12/18 0500 10/13/18 0447 10/14/18 0433  Weight: 109.4 kg 106 kg 106.4 kg    General appearance: alert, cooperative, appears stated age  and no distress Head: Normocephalic, without obvious abnormality, atraumatic Resp: Mildly tachypneic at rest.  crackles noted at the bases.  No wheezing or rhonchi. Cardio: regular rate and rhythm, S1, S2 normal, no murmur, click, rub or gallop GI: soft, non-tender; bowel sounds normal; no masses,  no organomegaly Extremities: extremities normal, atraumatic, no cyanosis or edema Pulses: 2+ and symmetric Neurologic: No focal neurological deficits.  Lab Results:  Data Reviewed: I have personally reviewed following labs and imaging studies  CBC: Recent Labs  Lab 10/11/18 0750  10/11/18 1519 10/12/18 0517 10/12/18 0518 10/13/18 0240 10/14/18 0458  WBC 18.5*  --   --  10.0  --  9.7 11.4*  NEUTROABS 11.4*  --   --   --   --  8.1*  --   HGB 11.4*   < > 10.5* 9.3* 9.5* 9.7* 9.8*  HCT 39.2   < > 31.0* 30.2* 28.0* 30.2* 30.7*  MCV 92.2  --   --  88.6  --  87.3 88.2  PLT 369  --   --  229  --  247 243   < > = values in this interval not displayed.    Basic Metabolic Panel: Recent Labs  Lab 10/11/18 1203  10/11/18 1610 10/12/18 0517 10/12/18 0518 10/12/18 1747 10/12/18 1950 10/13/18 0240 10/13/18 1015 10/14/18 0458  NA  --    < > 139 139 138  --  136 139  --  139  K  --    < > 4.6 4.2 4.2  --  3.6 3.5 4.0 4.1  CL  --   --  103 105  --   --  100 98  --  99  CO2  --   --  25 26  --   --  28 27  --  28  GLUCOSE  --   --  202* 183*  --   --  164* 181*  --  156*  BUN  --   --  22 27*  --   --  31* 35*  --  34*  CREATININE  --   --  1.14 1.25*  --   --  1.31* 1.40*  --  1.35*  CALCIUM  --   --  8.3* 8.2*  --   --  8.1* 8.6*  --  8.6*  MG 1.3*  --  1.3* 2.0  --  2.0 1.9  --   --   --   PHOS 3.6  --  3.7 3.3  --  4.1 3.8  --   --   --    < > = values in this interval not displayed.    GFR: Estimated Creatinine Clearance: 60.9 mL/min (A) (by C-G formula based on SCr of 1.35 mg/dL (H)).  Liver Function Tests: Recent Labs  Lab 10/11/18 0750  AST 32  ALT 35  ALKPHOS 117   BILITOT 0.4  PROT 7.3  ALBUMIN 3.3*    Coagulation Profile: Recent Labs  Lab 10/11/18 0750  INR 1.1    Cardiac Enzymes: Recent Labs  Lab 10/11/18 0750  TROPONINI 0.03*    CBG: Recent Labs  Lab 10/14/18 1916 10/14/18 2332 10/15/18 0514 10/15/18 0757 10/15/18 1201  GLUCAP 181* 143* 153* 117* 181*     Recent Results (from the past 240 hour(s))  Novel Coronavirus, NAA (hospital order; send-out to ref lab)     Status: None   Collection Time: 10/11/18  8:11 AM  Result Value Ref Range Status   SARS-CoV-2, NAA NOT DETECTED NOT DETECTED Final    Comment: Performed at [facility]   Coronavirus Source NASOPHARYNGEAL  Final    Comment: Performed at [facility], [address] [place], [place] [address]  MRSA PCR Screening     Status: None   Collection Time: 10/11/18  8:24 AM  Result Value Ref Range Status   MRSA by PCR NEGATIVE NEGATIVE Final    Comment:        The GeneXpert MRSA Assay (FDA approved for NASAL specimens only), is one component of a comprehensive MRSA colonization surveillance program. It is not intended to diagnose MRSA infection nor to guide or monitor treatment for MRSA infections. Performed at [facility], [address] [place], [place] [address]   Blood Culture (routine x 2)     Status: None (Preliminary result)   Collection Time: 10/11/18  9:14 AM  Result Value Ref Range Status   Specimen Description BLOOD NECK RIGHT  Final   Special Requests   Final    BOTTLES DRAWN AEROBIC AND ANAEROBIC Blood Culture adequate volume   Culture   Final    NO GROWTH 4 DAYS Performed at [facility], [address] [place], [place] [address]    Report Status PENDING  Incomplete  Respiratory Panel by PCR     Status: None   Collection Time: 10/11/18 11:27 AM  Result Value Ref Range Status  Adenovirus NOT DETECTED NOT DETECTED Final   Coronavirus 229E NOT DETECTED NOT DETECTED Final    Comment: (NOTE) The Coronavirus on the  Respiratory Panel, DOES NOT test for the novel  Coronavirus (2019 nCoV)    Coronavirus HKU1 NOT DETECTED NOT DETECTED Final   Coronavirus NL63 NOT DETECTED NOT DETECTED Final   Coronavirus OC43 NOT DETECTED NOT DETECTED Final   Metapneumovirus NOT DETECTED NOT DETECTED Final   Rhinovirus / Enterovirus NOT DETECTED NOT DETECTED Final   Influenza A NOT DETECTED NOT DETECTED Final   Influenza B NOT DETECTED NOT DETECTED Final   Parainfluenza Virus 1 NOT DETECTED NOT DETECTED Final   Parainfluenza Virus 2 NOT DETECTED NOT DETECTED Final   Parainfluenza Virus 3 NOT DETECTED NOT DETECTED Final   Parainfluenza Virus 4 NOT DETECTED NOT DETECTED Final   Respiratory Syncytial Virus NOT DETECTED NOT DETECTED Final   Bordetella pertussis NOT DETECTED NOT DETECTED Final   Chlamydophila pneumoniae NOT DETECTED NOT DETECTED Final   Mycoplasma pneumoniae NOT DETECTED NOT DETECTED Final    Comment: Performed at [facility], [address] [place], [place] [address]  Blood Culture (routine x 2)     Status: None (Preliminary result)   Collection Time: 10/11/18  1:29 PM  Result Value Ref Range Status   Specimen Description BLOOD SITE NOT SPECIFIED  Final   Special Requests AEROBIC BOTTLE ONLY Blood Culture adequate volume  Final   Culture   Final    NO GROWTH 4 DAYS Performed at [facility], [address], [place], [place] [address]    Report Status PENDING  Incomplete      Radiology Studies: No results found.   Medications:  Scheduled: . aspirin  81 mg Oral Daily  . atorvastatin  40 mg Per Tube QPM  . carvedilol  3.125 mg Per Tube BID WC  . enoxaparin (LOVENOX) injection  40 mg Subcutaneous Q24H  . famotidine  20 mg Oral Daily  . finasteride  5 mg Oral Daily  . gabapentin  100 mg Oral TID  . insulin aspart  3-9 Units Subcutaneous Q4H  . mouth rinse  15 mL Mouth Rinse BID  . montelukast  10 mg Per Tube QHS  . multivitamin with minerals  1 tablet Per Tube Daily  .  spironolactone  25 mg Per Tube Daily  . tamsulosin  0.4 mg Oral Daily   Continuous: . sodium chloride 10 mL/hr at 10/14/18 1900  . cefTRIAXone (ROCEPHIN)  IV 2 g (10/15/18 1005)   JYN:WGNFAO chloride, albuterol, HYDROcodone-acetaminophen, morphine injection, ondansetron (ZOFRAN) IV    Assessment/Plan:   Acute respiratory failure with hypoxia Thought to be due to either community-acquired pneumonia or acute pulmonary edema or a combination of both.  Patient however improved rapidly after he was given diuretics which favors a diagnosis of pulmonary edema.  Patient was intubated at admission.  He was extubated successfully.  Still on 3-1/2 L of oxygen by nasal cannula.  Continue to monitor for now.  Acute systolic and diastolic CHF/Cardiac arrest secondary to PEA Patient had cardiac arrest.  He was successfully resuscitated.  This is thought to be due to hypoxia as the initial rhythm was PEA.  His chest pain is most likely musculoskeletal from the CPR.  Troponin x1 on 4/11 was 0.03.  Patient was given IV diuretics with significant improvement.  We will give him additional dose of Lasix intravenously today.  Due to his cardiac arrest and previous history of cardiac disease  he warrants an inpatient cardiology consult.  Cardiology has been consulted.  Will defer to them regarding repeating echocardiogram as his last one was in December 2019.  Pain control for chest pain.  Continue carvedilol and spironolactone.  He was on ARB at home.  Chronic kidney disease stage III Renal function at baseline.  Continue to monitor.  Community-acquired pneumonia Respiratory viral panel negative.  HIV nonreactive.  COVID-19 test negative.  5 days of ceftriaxone.  Diabetes mellitus type II Noted to be on metformin and glipizide at home.  Currently on SSI.  Monitor CBGs.  Normocytic anemia No evidence for blood loss.  Monitor hemoglobin closely.  Close to baseline.   DVT Prophylaxis: Lovenox    Code Status:  Full code Family Communication: Discussed with the patient Disposition Plan: Management as outlined above.  PT evaluation.    LOS: 4 days   Bellarose Burtt [employer] on [email or web address]  10/15/2018, 1:21 PM

## 2018-10-15 NOTE — Evaluation (Signed)
Occupational Therapy Evaluation Patient Details Name: [name] MRN: [id number] DOB: 12/12/1950 Today's Date: 10/15/2018    History of Present Illness Pt is a 68 yo male s/p  hypoxemic and cardiac arrest requiring CPR in ED. Pt PMHx: DM, fatty liver dx, HTN, PVDx   Clinical Impression   Pt PTA: independent with adl and mobility. Pt currently limited by poor activity tolerance, poor mobility and decreased ability to perform ADL. Pt modA for bed mobility. Pt performing ADL functional transfers with minA and ADL functional mobility with RW minguardA/. Pt would greatly benefit from continued OT skilled services for ADL, mobility and safety in SNF setting. OT to follow acutely.    Follow Up Recommendations  SNF;Supervision/Assistance - 24 hour    Equipment Recommendations  3 in 1 bedside commode    Recommendations for Other Services       Precautions / Restrictions Precautions Precautions: Fall Restrictions Weight Bearing Restrictions: No      Mobility Bed Mobility Overal bed mobility: Needs Assistance Bed Mobility: Sidelying to Sit   Sidelying to sit: Mod assist;HOB elevated          Transfers Overall transfer level: Needs assistance Equipment used: Rolling walker (2 wheeled) Transfers: Sit to/from Stand Sit to Stand: Min assist;From elevated surface              Balance                                           ADL either performed or assessed with clinical judgement   ADL Overall ADL's : Needs assistance/impaired Eating/Feeding: Set up;Sitting   Grooming: Set up;Sitting   Upper Body Bathing: Set up;Sitting   Lower Body Bathing: Moderate assistance;Sit to/from stand   Upper Body Dressing : Set up;Sitting   Lower Body Dressing: Moderate assistance;Sit to/from stand   Toilet Transfer: Minimal assistance;BSC   Toileting- Clothing Manipulation and Hygiene: Moderate assistance;Sitting/lateral lean;Sit to/from stand        Functional mobility during ADLs: Min guard;Rolling walker General ADL Comments: set-upA for UB and ModA for LB due to compressions from CPR     Vision Baseline Vision/History: No visual deficits Vision Assessment?: No apparent visual deficits     Perception     Praxis      Pertinent Vitals/Pain Pain Assessment: Faces Faces Pain Scale: Hurts even more Pain Descriptors / Indicators: Aching;Cramping;Discomfort Pain Intervention(s): Monitored during session;Limited activity within patient's tolerance     Hand Dominance Right   Extremity/Trunk Assessment Upper Extremity Assessment Upper Extremity Assessment: Generalized weakness   Lower Extremity Assessment Lower Extremity Assessment: Defer to PT evaluation   Cervical / Trunk Assessment Cervical / Trunk Assessment: Kyphotic   Communication Communication Communication: No difficulties   Cognition Arousal/Alertness: Awake/alert Behavior During Therapy: WFL for tasks assessed/performed Overall Cognitive Status: Within Functional Limits for tasks assessed                                     General Comments       Exercises     Shoulder Instructions      Home Living Family/patient expects to be discharged to:: Private residence Living Arrangements: Spouse/significant other Available Help at Discharge: Family;Available 24 hours/day Type of Home: House Home Access: Stairs to enter [employer] CorporationEntrance Stairs-Number of Steps: 4 Entrance Stairs-Rails: Can reach both  Home Layout: One level     Bathroom Shower/Tub: Tub/shower unit;Walk-in shower   Bathroom Toilet: Standard     Home Equipment: None          Prior Functioning/Environment Level of Independence: Independent                 OT Problem List: Decreased strength;Decreased activity tolerance;Impaired balance (sitting and/or standing);Decreased safety awareness;Pain      OT Treatment/Interventions: Self-care/ADL training;Therapeutic  exercise;Neuromuscular education;Energy conservation;Therapeutic activities;Patient/family education;Balance training    OT Goals(Current goals can be found in the care plan section) Acute Rehab OT Goals Patient Stated Goal: to go home safely OT Goal Formulation: With patient Time For Goal Achievement: 10/29/18 Potential to Achieve Goals: Good ADL Goals Pt Will Perform Upper Body Dressing: with set-up;standing Pt Will Perform Lower Body Dressing: with set-up;sitting/lateral leans Pt Will Transfer to Toilet: with modified independence;ambulating;regular height toilet Additional ADL Goal #1: Pt will perform ADL functional mobility with modified independence with least restrictive device. Additional ADL Goal #2: pt will stand x5 with RW in prep for ADL with fair balance.  OT Frequency: Min 3X/week   Barriers to D/C:            Co-evaluation              AM-PAC OT "6 Clicks" Daily Activity     Outcome Measure Help from another person eating meals?: None Help from another person taking care of personal grooming?: None Help from another person toileting, which includes using toliet, bedpan, or urinal?: A Lot Help from another person bathing (including washing, rinsing, drying)?: A Lot Help from another person to put on and taking off regular upper body clothing?: A Lot Help from another person to put on and taking off regular lower body clothing?: A Lot 6 Click Score: 16   End of Session Equipment Utilized During Treatment: Gait belt;Rolling walker Nurse Communication: Mobility status  Activity Tolerance: Patient tolerated treatment well;Treatment limited secondary to medical complications (Comment) Patient left: in chair;with call bell/phone within reach;with chair alarm set  OT Visit Diagnosis: Unsteadiness on feet (R26.81);Muscle weakness (generalized) (M62.81)                Time: 5701-7793 OT Time Calculation (min): 31 min Charges:  OT General Charges $OT Visit: 1  Visit OT Evaluation $OT Eval Moderate Complexity: 1 Mod OT Treatments $Self Care/Home Management : 8-22 mins  Revonda Standard Cecil Cranker) Glendell Docker OTR/L Acute Rehabilitation Services Pager: [phone] Office: [phone]   Lonzo Cloud 10/15/2018, 5:14 PM

## 2018-10-15 NOTE — Progress Notes (Signed)
2D Echocardiogram has been performed.  Pieter Partridge 10/15/2018, 5:03 PM

## 2018-10-15 NOTE — Progress Notes (Addendum)
Pt resting in bed, on bedside monitor normal sinus rhythm with rare PVCs. Pt on 3L [place], sat 95%, denies shortness of breath, just pain in chest wall (consistent with hx of chest compressions). Foley catheter draining clear yellow urine, right IJ dressing clean/dry/intact, saline locked. MD in to evaluate pt, plan to leave Foley and IJ for today and re-eval tomorrow. Pt does not use O2 at baseline, will continue to slowly wean.

## 2018-10-15 NOTE — Progress Notes (Signed)
10/15/18 1721  PT Visit Information  Last PT Received On 10/15/18  Assistance Needed +1  History of Present Illness Pt is a 68 yo male s/p  hypoxemic and cardiac arrest requiring CPR in ED. Pt was intubated 4/11 and extubated 4/14. Pt PMHx: DM, fatty liver dx, HTN, PVDx  Precautions  Precautions Fall  Restrictions  Weight Bearing Restrictions No  Home Living  Family/patient expects to be discharged to: Private residence  Living Arrangements Spouse/significant other  Available Help at Discharge Family;Available 24 hours/day  Type of Home House  Home Access Stairs to enter  Entrance Stairs-Number of Steps 4  Entrance Stairs-Rails None  Home Layout One level  Bathroom Shower/Tub Tub/shower unit;Walk-in Corporate treasurershower  Bathroom Toilet Standard  Home Equipment None  Prior Function  Level of Independence Independent  Communication  Communication No difficulties  Pain Assessment  Pain Assessment Faces  Faces Pain Scale 6  Pain Location Chest from CPR  Pain Descriptors / Indicators Aching;Cramping;Discomfort;Sore  Pain Intervention(s) Limited activity within patient's tolerance;Monitored during session;Repositioned  Cognition  Arousal/Alertness Awake/alert  Behavior During Therapy WFL for tasks assessed/performed  Overall Cognitive Status Within Functional Limits for tasks assessed  Upper Extremity Assessment  Upper Extremity Assessment Defer to OT evaluation  Lower Extremity Assessment  Lower Extremity Assessment Generalized weakness  Cervical / Trunk Assessment  Cervical / Trunk Assessment Kyphotic  Bed Mobility  Overal bed mobility Needs Assistance  Bed Mobility Sit to Supine  Sit to supine Mod assist  General bed mobility comments Mod A for LE assist to return to supine. Pt very guarded secondary to chest discomfort.   Transfers  Overall transfer level Needs assistance  Equipment used Rolling walker (2 wheeled)  Transfers Sit to/from [employer]Stand;Stand Pivot Transfers  Sit to Stand  Min assist  Stand pivot transfers Min guard  General transfer comment Min A for lift assist and steadying to stand. Required min guard to transfer to bed. Pt refusing further mobility as he was fatigued and sore.   Balance  Overall balance assessment Needs assistance  Sitting-balance support No upper extremity supported;Feet supported  Sitting balance-Leahy Scale Good  Standing balance support Bilateral upper extremity supported;During functional activity  Standing balance-Leahy Scale Poor  Standing balance comment Reliant on BUE support   General Comments  General comments (skin integrity, edema, etc.) Pt reports grandson can assist as needed.   PT - End of Session  Activity Tolerance Patient limited by fatigue  Patient left in bed;with bed alarm set;with call bell/phone within reach  Nurse Communication Mobility status  PT Assessment  PT Recommendation/Assessment Patient needs continued PT services  PT Visit Diagnosis Unsteadiness on feet (R26.81);Muscle weakness (generalized) (M62.81)  PT Problem List Decreased strength;Decreased balance;Decreased activity tolerance;Decreased mobility;Decreased knowledge of use of DME;Decreased knowledge of precautions;Pain  PT Plan  PT Frequency (ACUTE ONLY) Min 3X/week  PT Treatment/Interventions (ACUTE ONLY) DME instruction;Gait training;Functional mobility training;Stair training;Balance training;Therapeutic exercise;Therapeutic activities;Patient/family education  AM-PAC PT "6 Clicks" Mobility Outcome Measure (Version 2)  Help needed turning from your back to your side while in a flat bed without using bedrails? 2  Help needed moving from lying on your back to sitting on the side of a flat bed without using bedrails? 2  Help needed moving to and from a bed to a chair (including a wheelchair)? 3  Help needed standing up from a chair using your arms (e.g., wheelchair or bedside chair)? 3  Help needed to walk in hospital room? 3  Help needed  climbing 3-5  steps with a railing?  2  6 Click Score 15  Consider Recommendation of Discharge To: CIR/SNF/LTACH  PT Recommendation  Follow Up Recommendations SNF;Supervision/Assistance - 24 hour (may progress to HHPT )  PT equipment Rolling walker with 5" wheels  Individuals Consulted  Consulted and Agree with Results and Recommendations Patient  Acute Rehab PT Goals  Patient Stated Goal to go home safely  PT Goal Formulation With patient  Time For Goal Achievement 10/29/18  Potential to Achieve Goals Fair  PT Time Calculation  PT Start Time (ACUTE ONLY) 1527  PT Stop Time (ACUTE ONLY) 1551  PT Time Calculation (min) (ACUTE ONLY) 24 min  PT General Charges  $$ ACUTE PT VISIT 1 Visit  PT Evaluation  $PT Eval Moderate Complexity 1 Mod  PT Treatments  $Therapeutic Activity 8-22 mins  Written Expression  Dominant Hand Right   Pt admitted secondary to problem above with deficits below. Pt moving very slow and requiring min to min guard A to perform transfer to bed. Pt refusing further mobility secondary to pain and fatigue. Given deficits, feel pt would benefit from SNF level therapies, however, pt may progress well and be able to d/c home with HHPT. Reports his grandson can physically assist if needed. Will continue to follow acutely to maximize functional mobility independence and safety.

## 2018-10-16 ENCOUNTER — Inpatient Hospital Stay (HOSPITAL_COMMUNITY): Payer: Medicare Other

## 2018-10-16 DIAGNOSIS — I5021 Acute systolic (congestive) heart failure: Secondary | ICD-10-CM

## 2018-10-16 DIAGNOSIS — E1122 Type 2 diabetes mellitus with diabetic chronic kidney disease: Secondary | ICD-10-CM

## 2018-10-16 LAB — CBC
HCT: 35.7 % — ABNORMAL LOW (ref 39.0–52.0)
Hemoglobin: 11.1 g/dL — ABNORMAL LOW (ref 13.0–17.0)
MCH: 26.9 pg (ref 26.0–34.0)
MCHC: 31.1 g/dL (ref 30.0–36.0)
MCV: 86.7 fL (ref 80.0–100.0)
Platelets: 304 10*3/uL (ref 150–400)
RBC: 4.12 MIL/uL — ABNORMAL LOW (ref 4.22–5.81)
RDW: 14.5 % (ref 11.5–15.5)
WBC: 11.7 10*3/uL — ABNORMAL HIGH (ref 4.0–10.5)
nRBC: 0 % (ref 0.0–0.2)

## 2018-10-16 LAB — BASIC METABOLIC PANEL
Anion gap: 14 (ref 5–15)
BUN: 44 mg/dL — ABNORMAL HIGH (ref 8–23)
CO2: 29 mmol/L (ref 22–32)
Calcium: 9.5 mg/dL (ref 8.9–10.3)
Chloride: 93 mmol/L — ABNORMAL LOW (ref 98–111)
Creatinine, Ser: 1.29 mg/dL — ABNORMAL HIGH (ref 0.61–1.24)
GFR calc Af Amer: >60 mL/min (ref >60)
GFR calc non Af Amer: 57 mL/min — ABNORMAL LOW (ref >60)
Glucose, Bld: 160 mg/dL — ABNORMAL HIGH (ref 70–99)
Potassium: 4.3 mmol/L (ref 3.5–5.1)
Sodium: 136 mmol/L (ref 135–145)

## 2018-10-16 LAB — LIPID PANEL
Cholesterol: 165 mg/dL (ref 0–200)
HDL: 33 mg/dL — ABNORMAL LOW (ref >40)
LDL Cholesterol: 91 mg/dL (ref 0–99)
Total CHOL/HDL Ratio: 5 RATIO
Triglycerides: 204 mg/dL — ABNORMAL HIGH (ref <150)
VLDL: 41 mg/dL — ABNORMAL HIGH (ref 0–40)

## 2018-10-16 LAB — CULTURE, BLOOD (ROUTINE X 2)
Culture: NO GROWTH
Culture: NO GROWTH
Special Requests: ADEQUATE
Special Requests: ADEQUATE

## 2018-10-16 LAB — HEMOGLOBIN A1C
Hgb A1c MFr Bld: 8 % — ABNORMAL HIGH (ref 4.8–5.6)
Mean Plasma Glucose: 182.9 mg/dL

## 2018-10-16 LAB — GLUCOSE, CAPILLARY
Glucose-Capillary: 143 mg/dL — ABNORMAL HIGH (ref 70–99)
Glucose-Capillary: 144 mg/dL — ABNORMAL HIGH (ref 70–99)
Glucose-Capillary: 147 mg/dL — ABNORMAL HIGH (ref 70–99)
Glucose-Capillary: 186 mg/dL — ABNORMAL HIGH (ref 70–99)
Glucose-Capillary: 195 mg/dL — ABNORMAL HIGH (ref 70–99)

## 2018-10-16 LAB — ECHOCARDIOGRAM LIMITED
Height: 67 in
Weight: 3753.11 oz

## 2018-10-16 NOTE — Progress Notes (Signed)
Physical Therapy Treatment Patient Details Name: [name] MRN: [id number] DOB: 01/15/1951 Today's Date: 10/16/2018    History of Present Illness Pt is a 68 yo male s/p  hypoxemic and cardiac arrest requiring CPR in ED. Pt was intubated 4/11 and extubated 4/14. Pt PMHx: DM, fatty liver dx, HTN, PVDx    PT Comments    Pt progressing towards goals, however, continues to be limited secondary to fatigue. Only agreeable to ambulating from bathroom back to bed, as he reports he had been up multiple times. Required min A to stand and min guard for ambulation. Feel SNF is most appropriate d/c location, however, pt will likely refuse. If pt refuses will require HHPT. Will continue to follow acutely to maximize functional mobility independence and safety.    Follow Up Recommendations  SNF;Supervision/Assistance - 24 hour(may progress to HHPT )     Equipment Recommendations  Rolling walker with 5" wheels    Recommendations for Other Services       Precautions / Restrictions Precautions Precautions: Fall Restrictions Weight Bearing Restrictions: No    Mobility  Bed Mobility Overal bed mobility: Needs Assistance Bed Mobility: Rolling;Sit to Sidelying Rolling: Supervision       Sit to sidelying: Min assist General bed mobility comments: Cues for use of log roll technique to return to bed. Pt requiring min A for LE assist to come to sidelying. Supervision and increased time to roll to back. Pt very slow to move secondary to pain.   Transfers Overall transfer level: Needs assistance Equipment used: Rolling walker (2 wheeled) Transfers: Sit to/from Stand Sit to Stand: Min assist         General transfer comment: Min A for lift assist and steadying.   Ambulation/Gait Ambulation/Gait assistance: Min guard Gait Distance (Feet): 10 Feet Assistive device: Rolling walker (2 wheeled) Gait Pattern/deviations: Step-through pattern;Decreased stride length;Trunk flexed Gait velocity:  Decreased    General Gait Details: Very slow, guarded gait. Pt only wanting to ambulate from bathroom and back to bed this session. Cues for appropriate use of RW.    Stairs             Wheelchair Mobility    Modified Rankin (Stroke Patients Only)       Balance Overall balance assessment: Needs assistance Sitting-balance support: No upper extremity supported;Feet supported Sitting balance-Leahy Scale: Good     Standing balance support: Bilateral upper extremity supported;During functional activity Standing balance-Leahy Scale: Poor Standing balance comment: Reliant on BUE support                             Cognition Arousal/Alertness: Awake/alert Behavior During Therapy: WFL for tasks assessed/performed Overall Cognitive Status: Within Functional Limits for tasks assessed                                        Exercises      General Comments        Pertinent Vitals/Pain Pain Assessment: Faces Faces Pain Scale: Hurts little more Pain Location: Chest from CPR Pain Descriptors / Indicators: Discomfort;Sore Pain Intervention(s): Limited activity within patient's tolerance;Monitored during session;Repositioned    Home Living                      Prior Function            PT Goals (current goals  can now be found in the care plan section) Acute Rehab PT Goals Patient Stated Goal: to go home safely PT Goal Formulation: With patient Time For Goal Achievement: 10/29/18 Potential to Achieve Goals: Fair Progress towards PT goals: Progressing toward goals    Frequency    Min 3X/week      PT Plan Current plan remains appropriate    Co-evaluation              AM-PAC PT "6 Clicks" Mobility   Outcome Measure  Help needed turning from your back to your side while in a flat bed without using bedrails?: A Lot Help needed moving from lying on your back to sitting on the side of a flat bed without using bedrails?:  A Lot Help needed moving to and from a bed to a chair (including a wheelchair)?: A Little Help needed standing up from a chair using your arms (e.g., wheelchair or bedside chair)?: A Little Help needed to walk in hospital room?: A Little Help needed climbing 3-5 steps with a railing? : A Lot 6 Click Score: 15    End of Session   Activity Tolerance: Patient limited by fatigue Patient left: in bed;with call bell/phone within reach;with bed alarm set Nurse Communication: Mobility status PT Visit Diagnosis: Unsteadiness on feet (R26.81);Muscle weakness (generalized) (M62.81)     Time: 7654-6503 PT Time Calculation (min) (ACUTE ONLY): 15 min  Charges:  $Therapeutic Activity: 8-22 mins                     Gladys Damme, PT, DPT  Acute Rehabilitation Services  Pager: [phone] Office: [phone]    [name] 10/16/2018, 4:51 PM

## 2018-10-16 NOTE — Progress Notes (Signed)
Progress Note  Patient Name: [name] Date of Encounter: 10/16/2018  Primary Cardiologist: Tonny Bollman, MD   Subjective   Feeling a little better today.  Mild shortness of breath persists.  Chest soreness persists and is worse with certain movements, he relates this to CPR.  Inpatient Medications    Scheduled Meds: . aspirin  81 mg Oral Daily  . atorvastatin  40 mg Per Tube QPM  . carvedilol  3.125 mg Per Tube BID WC  . enoxaparin (LOVENOX) injection  40 mg Subcutaneous Q24H  . famotidine  20 mg Oral Daily  . finasteride  5 mg Oral Daily  . furosemide  40 mg Oral Daily  . gabapentin  100 mg Oral TID  . insulin aspart  3-9 Units Subcutaneous Q4H  . mouth rinse  15 mL Mouth Rinse BID  . montelukast  10 mg Per Tube QHS  . multivitamin with minerals  1 tablet Per Tube Daily  . spironolactone  25 mg Per Tube Daily  . tamsulosin  0.4 mg Oral Daily   Continuous Infusions: . sodium chloride 10 mL/hr at 10/14/18 1900   PRN Meds: sodium chloride, albuterol, HYDROcodone-acetaminophen, morphine injection, ondansetron (ZOFRAN) IV   Vital Signs    Vitals:   10/15/18 1838 10/15/18 2026 10/16/18 0100 10/16/18 0429  BP:  119/80 127/84 135/84  Pulse:  72 73 78  Resp:  16 18 19   Temp: 98.2 F (36.8 C) 98.5 F (36.9 C) 98.5 F (36.9 C) 98.7 F (37.1 C)  TempSrc: Oral Oral Oral Oral  SpO2:      Weight:      Height:        Intake/Output Summary (Last 24 hours) at 10/16/2018 0950 Last data filed at 10/16/2018 0853 Gross per 24 hour  Intake 300 ml  Output -  Net 300 ml   Last 3 Weights 10/14/2018 10/13/2018 10/12/2018  Weight (lbs) 234 lb 9.1 oz 233 lb 11 oz 241 lb 2.9 oz  Weight (kg) 106.4 kg 106 kg 109.4 kg      Telemetry    Sinus rhythm - Personally Reviewed   Physical Exam  Alert, oriented, obese male in no distress GEN: No acute distress.   Neck: No JVD Cardiac: RRR, no murmurs, rubs, or gallops.  Respiratory:  Diminished breath sounds in the bases  bilaterally GI: Soft, nontender, non-distended, obese MS:  Trace bilateral pretibial edema with chronic stasis changes; No deformity. Neuro:  Nonfocal  Psych: Normal affect   Labs    Chemistry Recent Labs  Lab 10/11/18 0750  10/13/18 0240 10/13/18 1015 10/14/18 0458 10/16/18 0452  NA 139   < > 139  --  139 136  K 4.3   < > 3.5 4.0 4.1 4.3  CL 101   < > 98  --  99 93*  CO2 22   < > 27  --  28 29  GLUCOSE 335*   < > 181*  --  156* 160*  BUN 22   < > 35*  --  34* 44*  CREATININE 1.49*   < > 1.40*  --  1.35* 1.29*  CALCIUM 8.7*   < > 8.6*  --  8.6* 9.5  PROT 7.3  --   --   --   --   --   ALBUMIN 3.3*  --   --   --   --   --   AST 32  --   --   --   --   --  ALT 35  --   --   --   --   --   ALKPHOS 117  --   --   --   --   --   BILITOT 0.4  --   --   --   --   --   GFRNONAA 48*   < > 51*  --  54* 57*  GFRAA 55*   < > 59*  --  >60 >60  ANIONGAP 16*   < > 14  --  12 14   < > = values in this interval not displayed.     Hematology Recent Labs  Lab 10/13/18 0240 10/14/18 0458 10/16/18 0452  WBC 9.7 11.4* 11.7*  RBC 3.46* 3.48* 4.12*  HGB 9.7* 9.8* 11.1*  HCT 30.2* 30.7* 35.7*  MCV 87.3 88.2 86.7  MCH 28.0 28.2 26.9  MCHC 32.1 31.9 31.1  RDW 15.5 15.2 14.5  PLT 247 243 304    Cardiac Enzymes Recent Labs  Lab 10/11/18 0750  TROPONINI 0.03*   No results for input(s): TROPIPOC in the last 168 hours.   BNP Recent Labs  Lab 10/11/18 0755  BNP 342.4*     DDimer No results for input(s): DDIMER in the last 168 hours.   Radiology    No results found.  Cardiac Studies   TTE: read pending  Patient Profile     68 y.o. male with a hx of HTN, HL, DM, AAA, mild AI and mild non-obstructive CAD who was seen for the evaluation of acute HF at the request of Dr. Neoma LamingKrishan.   Assessment & Plan    1. Acute respiratory failure 2/2 to CAP/pulmonary edema: now extubated and transferred to floor. Completed course of antibiotics. Has had good diuresis with IV lasix.  [facility]  2. PEA arrest: brief while in the ED. Seems to have been 2/2 to hypoxia/respiratory failure. Echo has been completed with read pending. Plan for medical therapy unless significant finding on echo.   3. Acute on Chronic combined HF: Last echo 12/19 EF 45% with g2DD. He reports compliance with his home medications including lasix PTA. Good diuresis with IV lasix and weight is trending down.Net -6.3L -- now on oral lasix -- on coreg and spiro. (on valsartan prior to admission).  4. HL: on statin therapy.  -- LDL 91  5. DM: Metformin held, SSI while inpatient -- Hgb A1c 10.6 back in April 2019, now improved to 8.0.  6. AKI: Cr 1.49 on admission>>1.35. Home ARB held at this time with diuresis. -- follow BMET   For questions or updates, please contact [facility] Please consult [email or web address] for contact info under    Signed, Laverda PageLindsay Roberts, NP  10/16/2018, 9:50 AM    Patient seen, examined. Available data reviewed. Agree with findings, assessment, and plan as outlined by Laverda PageLindsay Roberts, NP-C.  The exam findings documented above reflect my personal findings on my examination of this patient today.  Formal echo interpretation is currently pending.  The patient appears stable from a cardiac perspective.  He is treated with a loop diuretic, spironolactone, and carvedilol.  Would continue his same therapy at discharge.  Will review his echo once the interpretation is completed.  Will arrange outpatient cardiology follow-up once he is closer to hospital discharge.  We will follow-up again tomorrow.  Tonny BollmanMichael Dorie Ohms, M.D. 10/16/2018 2:42 PM

## 2018-10-16 NOTE — Progress Notes (Signed)
[facility] PROGRESS NOTE  [name] ZOX:[id number] DOB: 12-24-1950 DOA: 10/11/2018  PCP: [facility], MD (Inactive)  Brief History/Interval Summary: 69 year old male with a past medical history significant for diabetes mellitus, peripheral arterial disease and fatty liver presented to the [facility] emergency department with shortness of breath.  Was noted to be profoundly hypoxemic. In the ER had a respiratory and cardiac arrest (PEA) lasting 5 minutes.  After intubation he was noted to be profoundly hypoxemic.  Patient was aggressively diuresed in the intensive care unit.  He was successfully extubated and then transferred to the floor.  Cardiology was consulted.  Reason for Visit: Acute respiratory failure with hypoxia.  Consultants: Patient was on critical care medicine service.  Cardiology.  Procedures:  October 11, 2018 endotracheal tube October 11, 2018 right internal jugular central line Foley catheter.  Transthoracic echocardiogram done on 4/15.  Results are pending.  Micro Data:  April 11 novel coronavirus - Negative April 11 respiratory culture -  NGTD April 11 blood culture - NGTD  April 11 respiratory viral panel - negative   Antimicrobials:  April 11 ceftriaxone - complete 5 days  April 11 azithromycin - stopped   Subjective/Interval History: Patient states that shortness of breath continues to improve.  Continues to have soreness in the chest but better than yesterday.  Pain medications are helping.    ROS: Denies any nausea or vomiting  Objective:  Vital Signs  Vitals:   10/15/18 1838 10/15/18 2026 10/16/18 0100 10/16/18 0429  BP:  119/80 127/84 135/84  Pulse:  72 73 78  Resp:  Temp: 98.2 F (36.8 C) 98.5 F (36.9 C) 98.5 F (36.9 C) 98.7 F (37.1 C)  TempSrc: Oral Oral Oral Oral  SpO2:      Weight:      Height:        Intake/Output Summary (Last 24 hours) at 10/16/2018 1225 Last data filed at 10/16/2018 1120 Gross per 24  hour  Intake 200 ml  Output 1000 ml  Net -800 ml   Filed Weights   10/12/18 0500 10/13/18 0447 10/14/18 0433  Weight: 109.4 kg 106 kg 106.4 kg   General appearance: Awake alert.  In no distress Resp: Coarse breath sounds bilaterally.  Diminished at the bases.  Few crackles.  No wheezing or rhonchi.  Normal effort at rest.   Cardio: S1-S2 is normal regular.  No S3-S4.  No rubs murmurs or bruit GI: Abdomen is soft.  Nontender nondistended.  Bowel sounds are present normal.  No masses organomegaly Extremities: No edema.  Full range of motion of lower extremities. Neurologic: Alert and oriented x3.  No focal neurological deficits.    Lab Results:  Data Reviewed: I have personally reviewed following labs and imaging studies  CBC: Recent Labs  Lab 10/11/18 0750  10/12/18 0517 10/12/18 0518 10/13/18 0240 10/14/18 0458 10/16/18 0452  WBC 18.5*  --  10.0  --  9.7 11.4* 11.7*  NEUTROABS 11.4*  --   --   --  8.1*  --   --   HGB 11.4*   < > 9.3* 9.5* 9.7* 9.8* 11.1*  HCT 39.2   < > 30.2* 28.0* 30.2* 30.7* 35.7*  MCV 92.2  --  88.6  --  87.3 88.2 86.7  PLT 369  --  229  --  247 243 304   < > = values in this interval not displayed.    Basic Metabolic Panel: Recent Labs  Lab 10/11/18 1203  10/11/18 1610 10/12/18 0517 10/12/18 0518 10/12/18 1747 10/12/18 1950 10/13/18 0240 10/13/18 1015 10/14/18 0458 10/16/18 0452  NA  --    < > 139 139 138  --  136 139  --  139 136  K  --    < > 4.6 4.2 4.2  --  3.6 3.5 4.0 4.1 4.3  CL  --   --  103 105  --   --  100 98  --  99 93*  CO2  --   --  25 26  --   --  28 27  --  28 29  GLUCOSE  --   --  202* 183*  --   --  164* 181*  --  156* 160*  BUN  --   --  22 27*  --   --  31* 35*  --  34* 44*  CREATININE  --   --  1.14 1.25*  --   --  1.31* 1.40*  --  1.35* 1.29*  CALCIUM  --   --  8.3* 8.2*  --   --  8.1* 8.6*  --  8.6* 9.5  MG 1.3*  --  1.3* 2.0  --  2.0 1.9  --   --   --   --   PHOS 3.6  --  3.7 3.3  --  4.1 3.8  --   --   --   --      < > = values in this interval not displayed.    GFR: Estimated Creatinine Clearance: 63.7 mL/min (A) (by C-G formula based on SCr of 1.29 mg/dL (H)).  Liver Function Tests: Recent Labs  Lab 10/11/18 0750  AST 32  ALT 35  ALKPHOS 117  BILITOT 0.4  PROT 7.3  ALBUMIN 3.3*    Coagulation Profile: Recent Labs  Lab 10/11/18 0750  INR 1.1    Cardiac Enzymes: Recent Labs  Lab 10/11/18 0750  TROPONINI 0.03*    CBG: Recent Labs  Lab 10/15/18 2024 10/16/18 0059 10/16/18 0426 10/16/18 0809 10/16/18 1219  GLUCAP 170* 143* 144* 147* 186*     Recent Results (from the past 240 hour(s))  Novel Coronavirus, NAA (hospital order; send-out to ref lab)     Status: None   Collection Time: 10/11/18  8:11 AM  Result Value Ref Range Status   SARS-CoV-2, NAA NOT DETECTED NOT DETECTED Final    Comment: Performed at [facility]   Coronavirus Source NASOPHARYNGEAL  Final    Comment: Performed at [facility], [address] [place], [place] [address]  MRSA PCR Screening     Status: None   Collection Time: 10/11/18  8:24 AM  Result Value Ref Range Status   MRSA by PCR NEGATIVE NEGATIVE Final    Comment:        The GeneXpert MRSA Assay (FDA approved for NASAL specimens only), is one component of a comprehensive MRSA colonization surveillance program. It is not intended to diagnose MRSA infection nor to guide or monitor treatment for MRSA infections. Performed at [facility], [address] [place], [place] [address]   Blood Culture (routine x 2)     Status: None   Collection Time: 10/11/18  9:14 AM  Result Value Ref Range Status   Specimen Description BLOOD NECK RIGHT  Final   Special Requests   Final    BOTTLES DRAWN AEROBIC AND ANAEROBIC Blood Culture adequate volume   Culture   Final  NO GROWTH 5 DAYS Performed at [facility], [address], [place], [place] [address]    Report Status 10/16/2018 FINAL  Final  Respiratory Panel by PCR      Status: None   Collection Time: 10/11/18 11:27 AM  Result Value Ref Range Status   Adenovirus NOT DETECTED NOT DETECTED Final   Coronavirus 229E NOT DETECTED NOT DETECTED Final    Comment: (NOTE) The Coronavirus on the Respiratory Panel, DOES NOT test for the novel  Coronavirus (2019 nCoV)    Coronavirus HKU1 NOT DETECTED NOT DETECTED Final   Coronavirus NL63 NOT DETECTED NOT DETECTED Final   Coronavirus OC43 NOT DETECTED NOT DETECTED Final   Metapneumovirus NOT DETECTED NOT DETECTED Final   Rhinovirus / Enterovirus NOT DETECTED NOT DETECTED Final   Influenza A NOT DETECTED NOT DETECTED Final   Influenza B NOT DETECTED NOT DETECTED Final   Parainfluenza Virus 1 NOT DETECTED NOT DETECTED Final   Parainfluenza Virus 2 NOT DETECTED NOT DETECTED Final   Parainfluenza Virus 3 NOT DETECTED NOT DETECTED Final   Parainfluenza Virus 4 NOT DETECTED NOT DETECTED Final   Respiratory Syncytial Virus NOT DETECTED NOT DETECTED Final   Bordetella pertussis NOT DETECTED NOT DETECTED Final   Chlamydophila pneumoniae NOT DETECTED NOT DETECTED Final   Mycoplasma pneumoniae NOT DETECTED NOT DETECTED Final    Comment: Performed at [facility], [address], [place], [place] [address]  Blood Culture (routine x 2)     Status: None   Collection Time: 10/11/18  1:29 PM  Result Value Ref Range Status   Specimen Description BLOOD SITE NOT SPECIFIED  Final   Special Requests AEROBIC BOTTLE ONLY Blood Culture adequate volume  Final   Culture   Final    NO GROWTH 5 DAYS Performed at [facility], [address], [place], [place] [address]    Report Status 10/16/2018 FINAL  Final      Radiology Studies: Dg Chest Port 1 View  Result Date: 10/16/2018 CLINICAL DATA:  Shortness of breath today. EXAM: PORTABLE CHEST 1 VIEW COMPARISON:  10/12/2018 FINDINGS: Right IJ central venous catheter unchanged. Interval removal of endotracheal tube and nasogastric tube. Lungs are adequately inflated and  demonstrate persistent bibasilar opacification with slight improvement which may be due to atelectasis or infection. Likely small amount of bilateral pleural fluid. Improved aeration over the right midlung. Stable cardiomegaly. Remainder the exam is unchanged. IMPRESSION: Improved opacification over the right mid to lower lung with mild improvement in bibasilar opacification likely atelectasis or infection. Likely small amount of bilateral pleural fluid. Stable cardiomegaly. Right IJ central venous catheter unchanged. Electronically Signed   By: Elberta Fortis M.D.   On: 10/16/2018 11:43     Medications:  Scheduled:  aspirin  81 mg Oral Daily   atorvastatin  40 mg Per Tube QPM   carvedilol  3.125 mg Per Tube BID WC   enoxaparin (LOVENOX) injection  40 mg Subcutaneous Q24H   famotidine  20 mg Oral Daily   finasteride  5 mg Oral Daily   furosemide  40 mg Oral Daily   gabapentin  100 mg Oral TID   insulin aspart  3-9 Units Subcutaneous Q4H   mouth rinse  15 mL Mouth Rinse BID   montelukast  10 mg Per Tube QHS   multivitamin with minerals  1 tablet Per Tube Daily   spironolactone  25 mg Per Tube Daily   tamsulosin  0.4 mg Oral Daily  Continuous:  sodium chloride 10 mL/hr at 10/14/18 1900   RNH:AFBXUX chloride, albuterol, HYDROcodone-acetaminophen, morphine injection, ondansetron (ZOFRAN) IV    Assessment/Plan:   Acute respiratory failure with hypoxia Thought to be due to either community-acquired pneumonia or acute pulmonary edema or a combination of both.  Patient however improved rapidly after he was given diuretics which favors a diagnosis of pulmonary edema.  Patient was intubated at admission.  He was extubated successfully.  Noted to be on 3 L of oxygen via nasal cannula.  Saturating 95 to 96%.  Continue to wean down oxygen.  Repeat chest x-ray today.    Acute systolic and diastolic CHF/Cardiac arrest secondary to PEA Patient had cardiac arrest.  He was  successfully resuscitated.  This is thought to be due to hypoxia as the initial rhythm was PEA.  His chest pain is most likely musculoskeletal from the CPR.  Troponin x1 on 4/11 was 0.03.  Patient was given IV diuretics with significant improvement.  Given additional dose of Lasix yesterday.  Chest x-ray done this morning shows improvement.  Incentive spirometry.  Check daily weights.  Ins and outs.  Cardiology has consulted.  Echocardiogram was done yesterday.  Report is pending.  Continue carvedilol and spironolactone.  Started on oral Lasix.  Okay to discontinue Foley catheter.  Chronic kidney disease stage III Renal function at baseline.  Continue to monitor.  Monitor urine output.  Community-acquired pneumonia Respiratory viral panel negative.  HIV nonreactive.  COVID-19 test negative.  Completed 5 days of ceftriaxone.  Diabetes mellitus type II Noted to be on metformin and glipizide at home.  Currently on SSI.  Monitor CBGs.  HbA1c 8.0.  Normocytic anemia No evidence for blood loss.  Monitor hemoglobin closely.  Close to baseline.   DVT Prophylaxis: Lovenox    Code Status: Full code Family Communication: Discussed with the patient Disposition Plan: Management as outlined above.  Seen by PT and OT who recommends skilled nursing facility.  Patient prefers to go home.  Continue to wean down oxygen.  Discontinue central line.    LOS: 5 days   Bernardo Brayman [employer] on [email or web address]  10/16/2018, 12:25 PM

## 2018-10-16 NOTE — Progress Notes (Addendum)
Occupational Therapy Treatment Patient Details Name: Harold ManisRodney Waller MRN: [id number] DOB: 07/13/1950 Today's Date: 10/16/2018    History of present illness Pt is a 68 yo male s/p  hypoxemic and cardiac arrest requiring CPR in ED. Pt was intubated 4/11 and extubated 4/14. Pt PMHx: DM, fatty liver dx, HTN, PVDx   OT comments  Pt readily willing to work with OT, but declined OOB to chair due to impending chest xray. Pt requiring moderate assistance for bed mobility, post CPR chest pain impeding. Performed seated grooming at EOB, stood and took several steps along EOB with RW and min assist. Pt to call nursing to assist to chair after xray.   Follow Up Recommendations  SNF;Supervision/Assistance - 24 hour(pt is hopeful for home)    Equipment Recommendations  3 in 1 bedside commode    Recommendations for Other Services      Precautions / Restrictions Precautions Precautions: Fall       Mobility Bed Mobility Overal bed mobility: Needs Assistance Bed Mobility: Supine to Sit;Sit to Supine     Supine to sit: Mod assist Sit to supine: Mod assist   General bed mobility comments: assist to raise trunk and for LEs back into bed  Transfers Overall transfer level: Needs assistance Equipment used: Rolling walker (2 wheeled) Transfers: Sit to/from Stand Sit to Stand: Min assist         General transfer comment: min assist to rise and steady from bed, pt with impending chest xray, did not get to chair     Balance Overall balance assessment: Needs assistance   Sitting balance-Leahy Scale: Good       Standing balance-Leahy Scale: Poor Standing balance comment: Reliant on BUE support                            ADL either performed or assessed with clinical judgement   ADL Overall ADL's : Needs assistance/impaired     Grooming: Wash/dry hands;Wash/dry face;Brushing hair;Oral care;Sitting;Set up                               Functional mobility during  ADLs: Minimal assistance;Rolling walker(side stepped along EOB ) General ADL Comments: educated in use of pillow to splint chest with coughing     Vision   Additional Comments: pt with 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 and cataracts, needed assistance to place phone call to wife   Perception     Praxis      Cognition Arousal/Alertness: Awake/alert Behavior During Therapy: WFL for tasks assessed/performed Overall Cognitive Status: Within Functional Limits for tasks assessed                                          Exercises     Shoulder Instructions       General Comments      Pertinent Vitals/ Pain       Pain Assessment: Faces Faces Pain Scale: Hurts even more Pain Location: Chest from CPR Pain Descriptors / Indicators: Discomfort;Sore Pain Intervention(s): Monitored during session;Repositioned  Home Living                                          Prior Functioning/Environment  Frequency  Min 3X/week        Progress Toward Goals  OT Goals(current goals can now be found in the care plan section)  Progress towards OT goals: Progressing toward goals  Acute Rehab OT Goals Patient Stated Goal: to go home safely OT Goal Formulation: With patient Time For Goal Achievement: 10/29/18 Potential to Achieve Goals: Good  Plan Discharge plan remains appropriate    Co-evaluation                 AM-PAC OT "6 Clicks" Daily Activity     Outcome Measure   Help from another person eating meals?: None Help from another person taking care of personal grooming?: A Little Help from another person toileting, which includes using toliet, bedpan, or urinal?: A Lot Help from another person bathing (including washing, rinsing, drying)?: A Lot Help from another person to put on and taking off regular upper body clothing?: A Lot Help from another person to put on and taking off regular lower body clothing?: A Lot 6 Click  Score: 15    End of Session Equipment Utilized During Treatment: Gait belt;Rolling walker  OT Visit Diagnosis: Unsteadiness on feet (R26.81);Muscle weakness (generalized) (M62.81)   Activity Tolerance Patient tolerated treatment well   Patient Left in bed;with call bell/phone within reach;with bed alarm set   Nurse Communication Other (comment)(IV alarming)        Time: 0950-1010 OT Time Calculation (min): 20 min  Charges: OT General Charges $OT Visit: 1 Visit  1 Self care  Martie Round, OTR/L Acute Rehabilitation Services Pager: [phone] Office: [phone]   Evern Bio 10/16/2018, 11:02 AM

## 2018-10-17 ENCOUNTER — Other Ambulatory Visit: Payer: Self-pay | Admitting: Physician Assistant

## 2018-10-17 ENCOUNTER — Other Ambulatory Visit: Payer: Self-pay

## 2018-10-17 DIAGNOSIS — I5043 Acute on chronic combined systolic (congestive) and diastolic (congestive) heart failure: Secondary | ICD-10-CM

## 2018-10-17 DIAGNOSIS — N179 Acute kidney failure, unspecified: Secondary | ICD-10-CM

## 2018-10-17 LAB — CBC
HCT: 33.3 % — ABNORMAL LOW (ref 39.0–52.0)
Hemoglobin: 10.3 g/dL — ABNORMAL LOW (ref 13.0–17.0)
MCH: 26.9 pg (ref 26.0–34.0)
MCHC: 30.9 g/dL (ref 30.0–36.0)
MCV: 86.9 fL (ref 80.0–100.0)
Platelets: 284 10*3/uL (ref 150–400)
RBC: 3.83 MIL/uL — ABNORMAL LOW (ref 4.22–5.81)
RDW: 14.2 % (ref 11.5–15.5)
WBC: 10.4 10*3/uL (ref 4.0–10.5)
nRBC: 0 % (ref 0.0–0.2)

## 2018-10-17 LAB — BASIC METABOLIC PANEL
Anion gap: 13 (ref 5–15)
BUN: 47 mg/dL — ABNORMAL HIGH (ref 8–23)
CO2: 27 mmol/L (ref 22–32)
Calcium: 9.1 mg/dL (ref 8.9–10.3)
Chloride: 95 mmol/L — ABNORMAL LOW (ref 98–111)
Creatinine, Ser: 1.41 mg/dL — ABNORMAL HIGH (ref 0.61–1.24)
GFR calc Af Amer: 59 mL/min — ABNORMAL LOW (ref >60)
GFR calc non Af Amer: 51 mL/min — ABNORMAL LOW (ref >60)
Glucose, Bld: 129 mg/dL — ABNORMAL HIGH (ref 70–99)
Potassium: 4.1 mmol/L (ref 3.5–5.1)
Sodium: 135 mmol/L (ref 135–145)

## 2018-10-17 LAB — GLUCOSE, CAPILLARY
Glucose-Capillary: 153 mg/dL — ABNORMAL HIGH (ref 70–99)
Glucose-Capillary: 177 mg/dL — ABNORMAL HIGH (ref 70–99)

## 2018-10-17 MED ORDER — SPIRONOLACTONE 25 MG PO TABS
25.0000 mg | ORAL_TABLET | Freq: Every day | ORAL | Status: DC
  Administered 2018-10-17: 25 mg via ORAL
  Filled 2018-10-17: qty 1

## 2018-10-17 MED ORDER — ATORVASTATIN CALCIUM 40 MG PO TABS: 40.0000 mg | ORAL_TABLET | Freq: Every evening | ORAL | Status: DC

## 2018-10-17 MED ORDER — ADULT MULTIVITAMIN W/MINERALS CH
1.0000 | ORAL_TABLET | Freq: Every day | ORAL | Status: DC
  Administered 2018-10-17: 1 via ORAL
  Filled 2018-10-17: qty 1

## 2018-10-17 MED ORDER — FUROSEMIDE 40 MG PO TABS: 40.0000 mg | ORAL_TABLET | Freq: Every day | ORAL | 1 refills | Status: DC

## 2018-10-17 MED ORDER — SPIRONOLACTONE 25 MG PO TABS: 25.0000 mg | ORAL_TABLET | Freq: Every day | ORAL | 1 refills | Status: DC

## 2018-10-17 MED ORDER — MONTELUKAST SODIUM 10 MG PO TABS: 10.0000 mg | ORAL_TABLET | Freq: Every day | ORAL | Status: DC

## 2018-10-17 MED ORDER — CARVEDILOL 3.125 MG PO TABS: 3.1250 mg | ORAL_TABLET | Freq: Two times a day (BID) | ORAL | 1 refills | Status: DC

## 2018-10-17 MED ORDER — CARVEDILOL 3.125 MG PO TABS
3.1250 mg | ORAL_TABLET | Freq: Two times a day (BID) | ORAL | Status: DC
  Administered 2018-10-17: 3.125 mg via ORAL
  Filled 2018-10-17: qty 1

## 2018-10-17 MED ORDER — HYDROCODONE-ACETAMINOPHEN 5-325 MG PO TABS: 1.0000 | ORAL_TABLET | Freq: Four times a day (QID) | ORAL | 0 refills | Status: DC | PRN

## 2018-10-17 NOTE — Progress Notes (Signed)
Spoke with Dr Rito Ehrlich.  The patient remained stable and he is planning on discharging him today.  We reviewed his medical program, lab work, and current trend of vital signs.  He is on a good program with the use of furosemide, carvedilol, and Spironolactone.  Will arrange a telemedicine visit in 1 to 2 weeks with a follow-up metabolic panel.  If his renal function remains stable would plan to initiate him on an ACE/ARB at that time.  He will be assessed for the need for home oxygen.  Follow-up to be arranged as above.  [name] 10/17/2018 9:45 AM

## 2018-10-17 NOTE — Progress Notes (Signed)
SATURATION QUALIFICATIONS: (This note is used to comply with regulatory documentation for home oxygen)  Patient Saturations on Room Air at Rest = 91%  Patient Saturations on Room Air while Ambulating = 86%  Patient Saturations on 2 Liters of oxygen while Ambulating = 92%  Please briefly explain why patient needs home oxygen: 

## 2018-10-17 NOTE — Discharge Summary (Signed)
[facility]  Physician Discharge Summary   Patient ID: Darry Erhardt MRN: [id number] DOB/AGE: 07-04-1950 68 y.o.  Admit date: 10/11/2018 Discharge date: 10/17/2018  PCP: [facility], MD (Inactive)  DISCHARGE DIAGNOSES:  Acute respiratory failure with hypoxia Acute systolic and diastolic CHF Cardiac arrest secondary to PEA Chronic kidney disease stage III Community-acquired pneumonia Covid 19 ruled out Diabetes mellitus type 2 Normocytic anemia  RECOMMENDATIONS FOR OUTPATIENT FOLLOW UP: 1. Outpatient follow-up with cardiology 2. Home health has been ordered 3. Home oxygen has been ordered    Home Health: PT/OT/RN/aide Equipment/Devices: Home oxygen, rolling walker, bedside commode  CODE STATUS: Full code  DISCHARGE CONDITION: fair  Diet recommendation: Modified carbohydrate  INITIAL HISTORY: 68 year old male with a past medical history significant for diabetes mellitus, peripheral arterial disease and fatty liver presented to the [facility] emergency department with shortness of breath. Was noted to be profoundly hypoxemic. In the ER had a respiratory and cardiac arrest (PEA) lasting 5 minutes. After intubation he was noted to be profoundly hypoxemic.  Patient was aggressively diuresed in the intensive care unit.  He was successfully extubated and then transferred to the floor.  Cardiology was consulted.  Consultants: Patient was on critical care medicine service.  Cardiology.  Procedures:  October 11, 2018 endotracheal tube October 11, 2018 right internal jugular central line Foley catheter.  Transthoracic echocardiogram done on 4/15.  Results are pending.  Micro Data:  April 11 novel coronavirus - Negative April 11 respiratory culture -NGTD April 11 blood culture - NGTD  April 11 respiratory viral panel - negative   Antimicrobials:  April 11 ceftriaxone- complete 5 days April 11 azithromycin- stopped    HOSPITAL COURSE:   Acute  respiratory failure with hypoxia Thought to be due to either community-acquired pneumonia or acute pulmonary edema or a combination of both.  Patient however improved rapidly after he was given diuretics which favors a diagnosis of pulmonary edema.  Patient was intubated at admission.  He was extubated successfully.  Noted to be on 3 L of oxygen via nasal cannula.  Saturating 95 to 96%.    He is noted to desaturate into the mid 80s on room air.  He will need home oxygen.    Acute systolic and diastolic CHF/Cardiac arrest secondary to PEA Patient had cardiac arrest.  He was successfully resuscitated.  This is thought to be due to hypoxia as the initial rhythm was PEA.  His chest pain is most likely musculoskeletal from the CPR.  Troponin x1 on 4/11 was 0.03.  Patient was given IV diuretics with significant improvement.  Now on oral Lasix as well as spironolactone.  Also on carvedilol.  No ACE inhibitor or ARB for now.  Cardiology will arrange outpatient follow-up.  Chronic kidney disease stage III Renal function at baseline.  Will need labs in the outpatient setting.  Community-acquired pneumonia/COVID-19 ruled out Respiratory viral panel negative.  HIV nonreactive.  COVID-19 test negative.  Completed 5 days of ceftriaxone.  Diabetes mellitus type II HbA1c 8.0.  Okay to resume glipizide.  Continue to hold metformin at discharge.  Normocytic anemia No evidence for blood loss.    Overall stable.  Discussed with patient and his wife today.  Discussed with Dr. Excell Seltzer.  Okay for discharge today.     PERTINENT LABS:  The results of significant diagnostics from this hospitalization (including imaging, microbiology, ancillary and laboratory) are listed below for reference.    Microbiology: Recent Results (from the past 240 hour(s))  Novel Coronavirus, NAA (  hospital order; send-out to ref lab)     Status: None   Collection Time: 10/11/18  8:11 AM  Result Value Ref Range Status    SARS-CoV-2, NAA NOT DETECTED NOT DETECTED Final    Comment: Performed at [facility]   Coronavirus Source NASOPHARYNGEAL  Final    Comment: Performed at [facility], [address] [place], [place] [address]  MRSA PCR Screening     Status: None   Collection Time: 10/11/18  8:24 AM  Result Value Ref Range Status   MRSA by PCR NEGATIVE NEGATIVE Final    Comment:        The GeneXpert MRSA Assay (FDA approved for NASAL specimens only), is one component of a comprehensive MRSA colonization surveillance program. It is not intended to diagnose MRSA infection nor to guide or monitor treatment for MRSA infections. Performed at [facility], [address] [place], [place] [address]   Blood Culture (routine x 2)     Status: None   Collection Time: 10/11/18  9:14 AM  Result Value Ref Range Status   Specimen Description BLOOD NECK RIGHT  Final   Special Requests   Final    BOTTLES DRAWN AEROBIC AND ANAEROBIC Blood Culture adequate volume   Culture   Final    NO GROWTH 5 DAYS Performed at [facility], [address] [place], [place] [address]    Report Status 10/16/2018 FINAL  Final  Respiratory Panel by PCR     Status: None   Collection Time: 10/11/18 11:27 AM  Result Value Ref Range Status   Adenovirus NOT DETECTED NOT DETECTED Final   Coronavirus 229E NOT DETECTED NOT DETECTED Final    Comment: (NOTE) The Coronavirus on the Respiratory Panel, DOES NOT test for the novel  Coronavirus (2019 nCoV)    Coronavirus HKU1 NOT DETECTED NOT DETECTED Final   Coronavirus NL63 NOT DETECTED NOT DETECTED Final   Coronavirus OC43 NOT DETECTED NOT DETECTED Final   Metapneumovirus NOT DETECTED NOT DETECTED Final   Rhinovirus / Enterovirus NOT DETECTED NOT DETECTED Final   Influenza A NOT DETECTED NOT DETECTED Final   Influenza B NOT DETECTED NOT DETECTED Final   Parainfluenza Virus 1 NOT DETECTED NOT DETECTED Final   Parainfluenza Virus 2 NOT DETECTED NOT DETECTED  Final   Parainfluenza Virus 3 NOT DETECTED NOT DETECTED Final   Parainfluenza Virus 4 NOT DETECTED NOT DETECTED Final   Respiratory Syncytial Virus NOT DETECTED NOT DETECTED Final   Bordetella pertussis NOT DETECTED NOT DETECTED Final   Chlamydophila pneumoniae NOT DETECTED NOT DETECTED Final   Mycoplasma pneumoniae NOT DETECTED NOT DETECTED Final    Comment: Performed at [facility], [address], [place], [place] [address]  Blood Culture (routine x 2)     Status: None   Collection Time: 10/11/18  1:29 PM  Result Value Ref Range Status   Specimen Description BLOOD SITE NOT SPECIFIED  Final   Special Requests AEROBIC BOTTLE ONLY Blood Culture adequate volume  Final   Culture   Final    NO GROWTH 5 DAYS Performed at [facility], [address], [place], [place] [address]    Report Status 10/16/2018 FINAL  Final     Labs: Basic Metabolic Panel: Recent Labs  Lab 10/11/18 1203  10/11/18 1610 10/12/18 0517  10/12/18 1747 10/12/18 1950 10/13/18 0240 10/13/18 1015 10/14/18 0458 10/16/18 0452 10/17/18 0254  NA  --    < >  139 139   < >  --  136 139  --  139 136 135  K  --    < > 4.6 4.2   < >  --  3.6 3.5 4.0 4.1 4.3 4.1  CL  --   --  103 105  --   --  100 98  --  99 93* 95*  CO2  --   --  25 26  --   --  28 27  --  GLUCOSE  --   --  202* 183*  --   --  164* 181*  --  156* 160* 129*  BUN  --   --  22 27*  --   --  31* 35*  --  34* 44* 47*  CREATININE  --   --  1.14 1.25*  --   --  1.31* 1.40*  --  1.35* 1.29* 1.41*  CALCIUM  --   --  8.3* 8.2*  --   --  8.1* 8.6*  --  8.6* 9.5 9.1  MG 1.3*  --  1.3* 2.0  --  2.0 1.9  --   --   --   --   --   PHOS 3.6  --  3.7 3.3  --  4.1 3.8  --   --   --   --   --    < > = values in this interval not displayed.   Liver Function Tests: Recent Labs  Lab 10/11/18 0750  AST 32  ALT 35  ALKPHOS 117  BILITOT 0.4  PROT 7.3  ALBUMIN 3.3*   CBC: Recent Labs  Lab 10/11/18 0750  10/12/18 0517 10/12/18 0518  10/13/18 0240 10/14/18 0458 10/16/18 0452 10/17/18 0254  WBC 18.5*  --  10.0  --  9.7 11.4* 11.7* 10.4  NEUTROABS 11.4*  --   --   --  8.1*  --   --   --   HGB 11.4*   < > 9.3* 9.5* 9.7* 9.8* 11.1* 10.3*  HCT 39.2   < > 30.2* 28.0* 30.2* 30.7* 35.7* 33.3*  MCV 92.2  --  88.6  --  87.3 88.2 86.7 86.9  PLT 369  --  229  --  247 243 304 284   < > = values in this interval not displayed.   Cardiac Enzymes: Recent Labs  Lab 10/11/18 0750  TROPONINI 0.03*   BNP: BNP (last 3 results) Recent Labs    06/01/18 2100 10/11/18 0755  BNP 275.1* 342.4*    CBG: Recent Labs  Lab 10/16/18 0809 10/16/18 1219 10/16/18 1551 10/17/18 0823 10/17/18 1209  GLUCAP 147* 186* 195* 153* 177*     IMAGING STUDIES Dg Chest Port 1 View  Result Date: 10/16/2018 CLINICAL DATA:  Shortness of breath today. EXAM: PORTABLE CHEST 1 VIEW COMPARISON:  10/12/2018 FINDINGS: Right IJ central venous catheter unchanged. Interval removal of endotracheal tube and nasogastric tube. Lungs are adequately inflated and demonstrate persistent bibasilar opacification with slight improvement which may be due to atelectasis or infection. Likely small amount of bilateral pleural fluid. Improved aeration over the right midlung. Stable cardiomegaly. Remainder the exam is unchanged. IMPRESSION: Improved opacification over the right mid to lower lung with mild improvement in bibasilar opacification likely atelectasis or infection. Likely small amount of bilateral pleural fluid. Stable cardiomegaly. Right IJ central venous catheter unchanged. Electronically Signed   By: Elberta Fortis M.D.   On: 10/16/2018 11:43   Dg  Chest Port 1 View  Result Date: 10/12/2018 CLINICAL DATA:  Follow-up chest x-ray.  + Covid EXAM: PORTABLE CHEST 1 VIEW COMPARISON:  Chest x-rays dated 10/11/2018 and 06/04/2018. FINDINGS: Improved aeration within the RIGHT lung compared to yesterday's exam, although persistent dense opacities within the lower lungs  bilaterally. Probable small bilateral pleural effusions. Heart size and mediastinal contours appear stable. Endotracheal tube, enteric tube and RIGHT IJ central line appear adequately positioned. No acute appearing osseous abnormality. IMPRESSION: 1. Improved aeration within the RIGHT lung compared to yesterday's exam, although persistent dense opacities within the lower lungs bilaterally. 2. Probable small bilateral pleural effusions. 3. Support apparatus appears appropriately positioned. Electronically Signed   By: Bary Richard M.D.   On: 10/12/2018 13:05   Dg Chest Port 1 View  Result Date: 10/11/2018 CLINICAL DATA:  Acute respiratory failure with hypoxemia. Bedside central venous catheter placement. EXAM: PORTABLE CHEST 1 VIEW 9:53 a.m.: COMPARISON:  Chest x-rays earlier same day 8:04 a.m. and previously. FINDINGS: RIGHT jugular central venous catheter tip projects over the LOWER SVC. No evidence of pneumothorax or mediastinal hematoma. Endotracheal tube tip in satisfactory position projecting approximately 4 cm above the carina. Cardiac silhouette moderately enlarged. Confluent airspace consolidation involving the RIGHT UPPER LOBE with patchy airspace opacities elsewhere in the RIGHT lung and throughout the LEFT lung. Pulmonary vascularity normal without evidence of pulmonary edema. IMPRESSION: 1. Endotracheal tube tip in satisfactory position projecting approximately 4 cm above the carina. 2. RIGHT jugular central venous catheter tip projects over the LOWER SVC. No acute complicating features. 3. Pneumonia throughout both lungs, most confluent in the RIGHT UPPER LOBE. 4. Cardiomegaly without evidence of pulmonary edema. Electronically Signed   By: Hulan Saas M.D.   On: 10/11/2018 10:34   Dg Chest Portable 1 View  Result Date: 10/11/2018 CLINICAL DATA:  Respiratory difficulty EXAM: PORTABLE CHEST 1 VIEW COMPARISON:  06/04/2018 FINDINGS: Endotracheal tube is in place. Tip is 1.9 cm from the  carina. There is extensive consolidation throughout the right lung with relative sparing at the right apex and right base. There is patchy airspace disease throughout the left lung. The heart is upper normal in size. Lungs are very under aerated. Vasculature is indistinct. IMPRESSION: Endotracheal tube as described Extensive bilateral airspace disease. Electronically Signed   By: Jolaine Click M.D.   On: 10/11/2018 09:04   Dg Abd Portable 1v  Result Date: 10/11/2018 CLINICAL DATA:  OG tube placement. EXAM: PORTABLE ABDOMEN - 1 VIEW COMPARISON:  None. FINDINGS: Enteric tube appears adequately positioned in the stomach with tip directed towards the stomach body/fundus. Dilated gas-filled bowel loops again appreciated within the mid and lower abdomen, similar to previous exams. IMPRESSION: Enteric tube appears adequately positioned in the stomach. Electronically Signed   By: Bary Richard M.D.   On: 10/11/2018 13:56    DISCHARGE EXAMINATION: Vitals:   10/17/18 0510 10/17/18 0606 10/17/18 0812 10/17/18 1000  BP: 129/77     Pulse: 76 75  79  Resp: (!) 22 17  16   Temp: 98 F (36.7 C)  98 F (36.7 C)   TempSrc: Oral  Oral   SpO2: 93% 94%  93%  Weight: 101.5 kg     Height:       General appearance: Awake alert.  In no distress Resp: Improved aeration bilaterally.  Normal effort.   Cardio: S1-S2 is normal regular.  No S3-S4.  No rubs murmurs or bruit GI: Abdomen is soft.  Nontender nondistended.  Bowel sounds are present normal.  No masses organomegaly Extremities: No edema.  Full range of motion of lower extremities. Neurologic: Alert and oriented x3.  No focal neurological deficits.    DISPOSITION: Home with home health  Discharge Instructions    (HEART FAILURE PATIENTS) Call MD:  Anytime you have any of the following symptoms: 1) 3 pound weight gain in 24 hours or 5 pounds in 1 week 2) shortness of breath, with or without a dry hacking cough 3) swelling in the hands, feet or stomach 4) if  you have to sleep on extra pillows at night in order to breathe.   Complete by:  As directed    Call MD for:  difficulty breathing, headache or visual disturbances   Complete by:  As directed    Call MD for:  extreme fatigue   Complete by:  As directed    Call MD for:  persistant dizziness or light-headedness   Complete by:  As directed    Call MD for:  persistant nausea and vomiting   Complete by:  As directed    Call MD for:  severe uncontrolled pain   Complete by:  As directed    Call MD for:  temperature >100.4   Complete by:  As directed    Diet - low sodium heart healthy   Complete by:  As directed    Diet Carb Modified   Complete by:  As directed    Discharge instructions   Complete by:  As directed    Please take your medications as prescribed.  Cardiology will arrange outpatient follow-up.  Home health has been ordered.  You were cared for by a hospitalist during your hospital stay. If you have any questions about your discharge medications or the care you received while you were in the hospital after you are discharged, you can call the unit and asked to speak with the hospitalist on call if the hospitalist that took care of you is not available. Once you are discharged, your primary care physician will handle any further medical issues. Please note that NO REFILLS for any discharge medications will be authorized once you are discharged, as it is imperative that you return to your primary care physician (or establish a relationship with a primary care physician if you do not have one) for your aftercare needs so that they can reassess your need for medications and monitor your lab values. If you do not have a primary care physician, you can call [phone] for a physician referral.   Increase activity slowly   Complete by:  As directed         Allergies as of 10/17/2018      Reactions   Cashew Nut Oil Palpitations   Percocet [oxycodone-acetaminophen] Other (See Comments)    Hallucintaions      Medication List    STOP taking these medications   metFORMIN 1000 MG tablet Commonly known as:  GLUCOPHAGE   potassium chloride 10 MEQ tablet Commonly known as:  K-DUR   valsartan 320 MG tablet Commonly known as:  DIOVAN     TAKE these medications   albuterol 108 (90 Base) MCG/ACT inhaler Commonly known as:  VENTOLIN HFA Inhale 2 puffs into the lungs every 6 (six) hours as needed for wheezing or shortness of breath.   aspirin EC 81 MG tablet Take 81 mg by mouth daily.   atorvastatin 40 MG tablet Commonly known as:  LIPITOR Take 1 tablet (40 mg total) by mouth every evening.   carvedilol 3.125  MG tablet Commonly known as:  COREG Take 1 tablet (3.125 mg total) by mouth 2 (two) times daily with a meal. What changed:    medication strength  how much to take   finasteride 5 MG tablet Commonly known as:  PROSCAR Take 5 mg by mouth daily.   furosemide 40 MG tablet Commonly known as:  LASIX Take 1 tablet (40 mg total) by mouth daily. What changed:    medication strength  how much to take   gabapentin 300 MG capsule Commonly known as:  NEURONTIN Take 300 mg by mouth 4 (four) times daily.   glipiZIDE 5 MG 24 hr tablet Commonly known as:  GLUCOTROL XL Take 5 mg by mouth daily.   HYDROcodone-acetaminophen 5-325 MG tablet Commonly known as:  NORCO/VICODIN Take 1 tablet by mouth every 6 (six) hours as needed for moderate pain.   hydrOXYzine 50 MG capsule Commonly known as:  VISTARIL Take 50 mg by mouth 3 (three) times daily.   montelukast 10 MG tablet Commonly known as:  SINGULAIR Take 10 mg by mouth at bedtime.   multivitamin tablet Take 1 tablet by mouth daily.   pantoprazole 40 MG tablet Commonly known as:  Protonix Take 1 tablet (40 mg total) by mouth daily.   spironolactone 25 MG tablet Commonly known as:  ALDACTONE Take 1 tablet (25 mg total) by mouth daily.   tamsulosin 0.4 MG Caps capsule Commonly known as:  FLOMAX Take  0.4 mg by mouth daily.            Durable Medical Equipment  (From admission, onward)         Start     Ordered   10/17/18 1219  For home use only DME 3 n 1  Once     10/17/18 1218   10/17/18 1218  For home use only DME Walker rolling  Once    Question:  Patient needs a walker to treat with the following condition  Answer:  Physical deconditioning   10/17/18 1218   10/17/18 1144  For home use only DME oxygen  Once    Question Answer Comment  Mode or (Route) Nasal cannula   Liters per Minute 2   Frequency Continuous (stationary and portable oxygen unit needed)   Oxygen conserving device Yes   Oxygen delivery system Gas      10/17/18 1143           Follow-up Information    Tonny Bollman, MD Follow up.   Specialty:  Cardiology Why:  His office will arrange follow-up (office will give you call) Contact information: [address] [place] [address] [phone]        [facility] Follow up.   Specialty:  Home Health Services Why:  Home Health Registered Nurse, Physical and Occupational Therapy Contact information: [address] [place] [address] [phone]        [facility] Follow up.   Why:  [phone] For the bedside commode, Rolling Walker and Home Oxygen          TOTAL DISCHARGE TIME: 35 minutes  Tres Grzywacz [employer] on [email or web address]  10/17/2018, 12:28 PM

## 2018-10-17 NOTE — Progress Notes (Signed)
Progress Note  Patient Name: Harold ManisRodney Waller Date of Encounter: 10/17/2018  Primary Cardiologist: Harold BollmanMichael Matt Delpizzo, MD    Inpatient Medications    Scheduled Meds: . aspirin  81 mg Oral Daily  . atorvastatin  40 mg Oral QPM  . carvedilol  3.125 mg Oral BID WC  . enoxaparin (LOVENOX) injection  40 mg Subcutaneous Q24H  . famotidine  20 mg Oral Daily  . finasteride  5 mg Oral Daily  . furosemide  40 mg Oral Daily  . gabapentin  100 mg Oral TID  . insulin aspart  3-9 Units Subcutaneous Q4H  . mouth rinse  15 mL Mouth Rinse BID  . montelukast  10 mg Oral QHS  . multivitamin with minerals  1 tablet Oral Daily  . spironolactone  25 mg Oral Daily  . tamsulosin  0.4 mg Oral Daily   Continuous Infusions: . sodium chloride 10 mL/hr at 10/14/18 1900   PRN Meds: sodium chloride, albuterol, HYDROcodone-acetaminophen, morphine injection, ondansetron (ZOFRAN) IV   Vital Signs    Vitals:   10/17/18 0502 10/17/18 0506 10/17/18 0510 10/17/18 0606  BP:   129/77   Pulse:  78 76 75  Resp:  15 (!) 22 17  Temp:   98 F (36.7 C)   TempSrc:   Oral   SpO2:  94% 93% 94%  Weight: 99.6 kg  101.5 kg   Height:        Intake/Output Summary (Last 24 hours) at 10/17/2018 0808 Last data filed at 10/17/2018 0506 Gross per 24 hour  Intake 944 ml  Output 1500 ml  Net -556 ml   Last 3 Weights 10/17/2018 10/17/2018 10/14/2018  Weight (lbs) 223 lb 12.3 oz 219 lb 9.3 oz 234 lb 9.1 oz  Weight (kg) 101.5 kg 99.6 kg 106.4 kg       Labs    Chemistry Recent Labs  Lab 10/11/18 0750  10/14/18 0458 10/16/18 0452 10/17/18 0254  NA 139   < > 139 136 135  K 4.3   < > 4.1 4.3 4.1  CL 101   < > 99 93* 95*  CO2 22   < > 28 29 27   GLUCOSE 335*   < > 156* 160* 129*  BUN 22   < > 34* 44* 47*  CREATININE 1.49*   < > 1.35* 1.29* 1.41*  CALCIUM 8.7*   < > 8.6* 9.5 9.1  PROT 7.3  --   --   --   --   ALBUMIN 3.3*  --   --   --   --   AST 32  --   --   --   --   ALT 35  --   --   --   --   ALKPHOS 117  --    --   --   --   BILITOT 0.4  --   --   --   --   GFRNONAA 48*   < > 54* 57* 51*  GFRAA 55*   < > >60 >60 59*  ANIONGAP 16*   < > 12 14 13    < > = values in this interval not displayed.     Hematology Recent Labs  Lab 10/14/18 0458 10/16/18 0452 10/17/18 0254  WBC 11.4* 11.7* 10.4  RBC 3.48* 4.12* 3.83*  HGB 9.8* 11.1* 10.3*  HCT 30.7* 35.7* 33.3*  MCV 88.2 86.7 86.9  MCH 28.2 26.9 26.9  MCHC 31.9 31.1 30.9  RDW 15.2 14.5 14.2  PLT 243 304 284    Cardiac Enzymes Recent Labs  Lab 10/11/18 0750  TROPONINI 0.03*   No results for input(s): TROPIPOC in the last 168 hours.   BNP Recent Labs  Lab 10/11/18 0755  BNP 342.4*     DDimer No results for input(s): DDIMER in the last 168 hours.   Radiology    Dg Chest Port 1 View  Result Date: 10/16/2018 CLINICAL DATA:  Shortness of breath today. EXAM: PORTABLE CHEST 1 VIEW COMPARISON:  10/12/2018 FINDINGS: Right IJ central venous catheter unchanged. Interval removal of endotracheal tube and nasogastric tube. Lungs are adequately inflated and demonstrate persistent bibasilar opacification with slight improvement which may be due to atelectasis or infection. Likely small amount of bilateral pleural fluid. Improved aeration over the right midlung. Stable cardiomegaly. Remainder the exam is unchanged. IMPRESSION: Improved opacification over the right mid to lower lung with mild improvement in bibasilar opacification likely atelectasis or infection. Likely small amount of bilateral pleural fluid. Stable cardiomegaly. Right IJ central venous catheter unchanged. Electronically Signed   By: Elberta Fortis M.D.   On: 10/16/2018 11:43    Cardiac Studies   Echo 10/15/2018 IMPRESSIONS    1. The left ventricle has mildly reduced systolic function, with an ejection fraction of 45-50%. The cavity size was normal. There is mild concentric left ventricular hypertrophy. Left ventricular diastolic Doppler parameters are consistent with  impaired  relaxation. There is abnormal septal motion consistent with left bundle branch block. Left ventricular diffuse hypokinesis.  2. The right ventricle has normal systolc function. The cavity was normal. There is no increase in right ventricular wall thickness.  3. There is severe mitral annular calcification present.  4. The aortic valve was not well visualized Moderate thickening of the aortic valve Moderate calcification of the aortic valve. Aortic valve regurgitation is mild by color flow Doppler.  5. There is mild dilatation of the aortic root measuring 39mm and of the ascending aorta measuring 44 mm.  Patient Profile     68 y.o. male with a hx of HTN, HL, DM, AAA, mild AI and mild non-obstructive CADwho was seen for the evaluation of acute HFat the request of Dr. Neoma Laming.   Assessment & Plan    1. Acute respiratory failure 2/2 to CAP/pulmonary edema:now extubated and transferred to floor. Completed course of antibiotics. Diuresed -7.1 L. Transfer of care to [facility] on 4/15.  2. PEA arrest: brief while in the ED. Seems to have been 2/2 to hypoxia/respiratory failure. Echo showed LVEF of 45-50% (similar to prior) and mild LVH. There is mild dilatation of the aortic root measuring 39mm and of the ascending aorta measuring 44 mm.  3. Acute on Chronic combined ZO:XWRUEA echo this admission showed stable LV function. Excellent 7.1L diuresis.  -- now on oral lasix  daily here (takes  at home). BUN/Scr 47/1.41 today from 44/1.29. May reduce to home dose pending physical exam.  -- on coreg and spiro. (on valsartan prior to admission).  4. HL: on statin therapy.  -- LDL 91  5. VW:UJWJXBJYN held, SSI while inpatient -- Hgb A1c 10.6 back in April 2019, now improved to 8.0.  6. AKI: Cr 1.49 on admission>>1.35>>1.29>>1.41 today. Home ARB held at this time with diuresis. --   For questions or updates, please contact [facility] Please consult [email or web address] for contact info under         SignedManson Passey, PA  10/17/2018, 8:08 AM    See separate note. Spoke with Dr [name]  regarding patient. Will arrange outpatient cardiology follow-up.  [name] 10/17/2018 9:46 AM

## 2018-10-17 NOTE — Discharge Instructions (Signed)

## 2018-10-17 NOTE — Progress Notes (Signed)
Physical Therapy Treatment Patient Details Name: [name] MRN: [id number] DOB: 10/01/1950 Today's Date: 10/17/2018    History of Present Illness Pt is a 68 yo male s/p  hypoxemic and cardiac arrest requiring CPR in ED. Pt was intubated 4/11 and extubated 4/14. Pt PMHx: DM, fatty liver dx, HTN, PVDx    PT Comments    Patient seen for mobility progression. Pt is making gradual progress toward PT goals and tolerated increased gait distance of 65 ft. SpO2 desat on RA and up to 90% on 2L O2 with mobility (see general comments below). Overall pt requires min guard assist for functional transfers and gait training. Continue to progress as tolerated.     Follow Up Recommendations  SNF;Supervision/Assistance - 24 hour(may progress to HHPT )     Equipment Recommendations  Rolling walker with 5" wheels;3in1 (PT)    Recommendations for Other Services       Precautions / Restrictions Precautions Precautions: Fall    Mobility  Bed Mobility               General bed mobility comments: pt OOB in chair upon arrival  Transfers Overall transfer level: Needs assistance Equipment used: Rolling walker (2 wheeled) Transfers: Sit to/from Stand Sit to Stand: Min guard         General transfer comment: min guard for safety; cues for safe hand placement; increased time and effort  Ambulation/Gait Ambulation/Gait assistance: Min guard Gait Distance (Feet): 65 Feet Assistive device: Rolling walker (2 wheeled) Gait Pattern/deviations: Step-through pattern;Decreased stride length;Trunk flexed Gait velocity: Decreased    General Gait Details: cues for upright posture and safe use of AD; when fatigued pt tends to increase trunk flexion and keep RW too far away   Stairs             Wheelchair Mobility    Modified Rankin (Stroke Patients Only)       Balance Overall balance assessment: Needs assistance Sitting-balance support: No upper extremity supported;Feet  supported Sitting balance-Leahy Scale: Good     Standing balance support: Bilateral upper extremity supported;During functional activity Standing balance-Leahy Scale: Poor                              Cognition Arousal/Alertness: Awake/alert Behavior During Therapy: WFL for tasks assessed/performed Overall Cognitive Status: Within Functional Limits for tasks assessed                                        Exercises      General Comments General comments (skin integrity, edema, etc.): SpO2 desat on RA at rest 88%, 86% on RA with short distance ambulation, and 90% on 2L O2 with ambulation      Pertinent Vitals/Pain Pain Assessment: Faces Faces Pain Scale: Hurts little more Pain Location: Chest from CPR Pain Descriptors / Indicators: Discomfort;Sore Pain Intervention(s): Limited activity within patient's tolerance;Monitored during session;Repositioned    Home Living                      Prior Function            PT Goals (current goals can now be found in the care plan section) Acute Rehab PT Goals Patient Stated Goal: to go home safely Progress towards PT goals: Progressing toward goals    Frequency    Min 3X/week  PT Plan Current plan remains appropriate    Co-evaluation              AM-PAC PT "6 Clicks" Mobility   Outcome Measure  Help needed turning from your back to your side while in a flat bed without using bedrails?: A Lot Help needed moving from lying on your back to sitting on the side of a flat bed without using bedrails?: A Lot Help needed moving to and from a bed to a chair (including a wheelchair)?: A Little Help needed standing up from a chair using your arms (e.g., wheelchair or bedside chair)?: A Little Help needed to walk in hospital room?: A Little Help needed climbing 3-5 steps with a railing? : A Lot 6 Click Score: 15    End of Session Equipment Utilized During Treatment: Gait  belt;Oxygen Activity Tolerance: Patient tolerated treatment well Patient left: with call bell/phone within reach;in chair;with chair alarm set Nurse Communication: Mobility status PT Visit Diagnosis: Unsteadiness on feet (R26.81);Muscle weakness (generalized) (M62.81)     Time: [id number] PT Time Calculation (min) (ACUTE ONLY): 18 min  Charges:  $Gait Training: 8-22 mins                     Erline LevineKellyn Tammie Yanda, PTA Acute Rehabilitation Services Pager: [phone] Office: [phone]     Carolynne EdouardKellyn R Laporscha Linehan 10/17/2018, 3:44 PM

## 2018-10-17 NOTE — Care Management Important Message (Signed)
Important Message  Patient Details  Name: [name] MRN: [id number] Date of Birth: 09/21/1950   Medicare Important Message Given:  Yes    Addylin Manke Stefan Church 10/17/2018, 3:51 PM

## 2018-10-17 NOTE — TOC Initial Note (Signed)
Transition of Care [facility]) - Initial/Assessment Note    Patient Details  Name: [name] MRN: [id number] Date of Birth: Aug 18, 1950  Transition of [facility]) CM/SW Contact:    Colleen Can RN, BSN, NCM-BC, ACM-RN [phone] (working remotely) Phone Number: 10/17/2018, 12:27 PM  Clinical Narrative:                 CM discussed with patient via phone to discuss the POC. Patient states living at home with his spouse, who assist with his care, as needed. Patient confirmed his demographics/insurance and PCP. CMS [facility] Medicare Compare list provided via phone with [facility] selected and [employer] for his DME needs. HH referral given to [facility] RN, [facility] liaison; Nida Boatman, [employer] liaison; AVS updated. Patient stated his spouse will provide transportation home and assist with his postcare needs. No further needs from CM.   Expected Discharge Plan: Home w Home Health Services Barriers to Discharge: No Barriers Identified   Patient Goals and CMS Choice Patient states their goals for this hospitalization and ongoing recovery are:: "to go home safely" CMS Medicare.gov Compare Post Acute Care list provided to:: Patient Choice offered to / list presented to : Patient  Expected Discharge Plan and Services Expected Discharge Plan: Home w Home Health Services In-house Referral: NA Discharge Planning Services: CM Consult Post Acute Care Choice: NA Living arrangements for the past 2 months: Single Family Home Expected Discharge Date: 10/17/18               DME Arranged: Oxygen, Walker rolling, Bedside commode DME Agency: [employer] HH Arranged: RN, Disease Management, PT, OT, Nurse's Aide HH Agency: [facility]  Prior Living Arrangements/Services Living arrangements for the past 2 months: Single Family Home Lives with:: Self, Spouse Patient language and need for interpreter reviewed:: Yes Do you feel safe going back to the place where you live?: Yes      Need for Family Participation  in Patient Care: Yes (Comment) Care giver support system in place?: Yes (comment)   Criminal Activity/Legal Involvement Pertinent to Current Situation/Hospitalization: No - Comment as needed  Activities of Daily Living Home Assistive Devices/Equipment: Bedside commode/3-in-1, CBG Meter, Shower chair with back ADL Screening (condition at time of admission) Patient's cognitive ability adequate to safely complete daily activities?: Yes Is the patient deaf or have difficulty hearing?: Yes Does the patient have difficulty seeing, even when wearing glasses/contacts?: Yes Does the patient have difficulty concentrating, remembering, or making decisions?: No Patient able to express need for assistance with ADLs?: Yes Does the patient have difficulty dressing or bathing?: Yes Independently performs ADLs?: No Communication: Independent Dressing (OT): Needs assistance Is this a change from baseline?: Change from baseline, expected to last >3 days Grooming: Independent Feeding: Independent Bathing: Needs assistance Is this a change from baseline?: Change from baseline, expected to last >3 days Toileting: Needs assistance Is this a change from baseline?: Change from baseline, expected to last >3days In/Out Bed: Needs assistance Is this a change from baseline?: Change from baseline, expected to last >3 days Walks in Home: Needs assistance Is this a change from baseline?: Change from baseline, expected to last >3 days Does the patient have difficulty walking or climbing stairs?: Yes Weakness of Legs: Both Weakness of Arms/Hands: Both  Permission Sought/Granted Permission sought to share information with : Case Manager, Magazine features editor Permission granted to share information with : Yes, Verbal Permission Granted     Permission granted to share info w AGENCY: HH/DME agency  Emotional Assessment   Attitude/Demeanor/Rapport: Engaged Affect (typically observed): Accepting,  Appropriate, Pleasant, Hopeful Orientation: : Oriented to Self, Oriented to Place, Oriented to  Time, Oriented to Situation Alcohol / Substance Use: Not Applicable Psych Involvement: No (comment)  Admission diagnosis:  Encounter for central line placement [Z45.2] Acute respiratory failure with hypoxia (HCC) [J96.01] Acute respiratory failure with hypoxemia (HCC) [J96.01] Respiratory arrest before cardiac arrest (HCC) [I46.9, R09.2] Patient Active Problem List   Diagnosis Date Noted  . Chronic venous insufficiency 06/24/2018  . Respiratory failure (HCC) 06/01/2018  . Leg swelling 05/06/2018  . Acute respiratory failure with hypoxemia (HCC) 10/14/2017  . Influenza-like illness 10/14/2017  . Delirium 10/14/2017  . Acute urinary retention 10/14/2017  . CAP (community acquired pneumonia)   . (HFpEF) heart failure with preserved ejection fraction (HCC), pulmonary edema and LBBB of unknown chronicity  10/29/2016  . Elevated troponin I level, presume demand ischemia  10/29/2016  . AKI (acute kidney injury) (HCC) 10/29/2016  . BPH (benign prostatic hyperplasia) 10/29/2016  . Sepsis (HCC) 10/29/2016  . Bacteremia due to Escherichia coli 10/29/2016  . Fatty liver 10/29/2016  . Acute pulmonary edema (HCC)   . Acute hypoxic and hypercarbic respiratory failure (HCC) in setting of bilateral pulmonary infiltrates. Presume CAP + pulmonary edema +/-ALI 10/27/2016  . Left-sided weakness 05/04/2015  . TIA (transient ischemic attack) : Rule out. 05/04/2015  . Hyperlipidemia 05/04/2015  . Chest pain 10/10/2013  . SOB (shortness of breath) 10/10/2013  . Hyperglycemia without ketosis 10/17/2011  . Diabetes mellitus (HCC) 10/17/2011  . HTN (hypertension) 10/17/2011  . Foot drop, left 10/17/2011  . Impacted cerumen of right ear 10/17/2011  . Dyspnea 10/17/2011  . History of TIA (transient ischemic attack) 10/17/2011  . Neuropathy (HCC) 10/17/2011  . Aortic insufficiency 03/16/2011  . Chest pain  03/16/2011   PCP:  Clinic, Medical, MD (Inactive) Pharmacy:   [facility] [address] [place] [address] Phone: [phone] Fax: [phone]  [facility] [place] [address] - [place], [place] - [address] [facility] [address] [address] [place] [address] Phone: [phone] Fax: [phone]  [facility] [address] - [place], [place] - [address] [place] [address] Phone: [phone] Fax: [phone]     Social Determinants of Health (SDOH) Interventions    Readmission Risk Interventions Readmission Risk Prevention Plan 10/17/2018  Transportation Screening Complete  PCP or Specialist Appt within 3-5 Days Complete  HRI or Home Care Consult Complete  Social Work Consult for Recovery Care Planning/Counseling Complete  Palliative Care Screening Not Applicable  Medication Review Oceanographer(RN Care Manager) Complete  Some recent data might be hidden

## 2018-10-21 ENCOUNTER — Telehealth: Payer: Self-pay | Admitting: Cardiovascular Disease

## 2018-10-21 MED ORDER — FUROSEMIDE 40 MG PO TABS: 40.0000 mg | ORAL_TABLET | Freq: Two times a day (BID) | ORAL | 3 refills | Status: DC

## 2018-10-21 NOTE — Telephone Encounter (Signed)
Spoke with patient's wife. She called to ask if patient needs potassium supplement. She states she has not spoken with Kathie Rhodes since I called her with Dr. Earmon Phoenix advice. I reviewed the medication instructions with her and advised no K+ supplement is to be given at this time. Wife was confused about medication instructions so I advised her to get the furosemide bottle, which is 20 mg pills. I gave her instructions to give the patient an additional 20 mg now and then to give him furosemide 80 mg (4 tabs) twice daily on Wed, Thurs, and Fri then decrease to 40 mg twice daily on Saturday and going forward. I advised that patient will have blood drawn on Monday by Kathie Rhodes according to the information Kathie Rhodes gave to me. I advised that patient has furosemide 40 mg tablets at pharmacy. I advised her to call back with questions or concerns. Wife verbalized understanding and agreement with plan and thanked me for the help.

## 2018-10-21 NOTE — Telephone Encounter (Signed)
New Message   Pt c/o swelling: STAT is pt has developed SOB within 24 hours  1) How much weight have you gained and in what time span? 10lbs in 3 days  2) If swelling, where is the swelling located? Yes, abdomen and lower legs   3) Are you currently taking a fluid pill? yes  4) Are you currently SOB? A little winded but he is on oxygen  5) Do you have a log of your daily weights (if so, list)? Sat. 225.2 Today 235.8  6) Have you gained 3 pounds in a day or 5 pounds in a week? yes  7) Have you traveled recently? No

## 2018-10-21 NOTE — Telephone Encounter (Signed)
Chart reviewed. Recommend furosemide 80 mg BID x 3 days, then 40 mg BID as standing dose. He really needs to limit sodium. Continue with daily weights. Should have lab draw next Monday for BMET, thanks

## 2018-10-21 NOTE — Telephone Encounter (Signed)
Follow Up:     Pt's wife is calling. Sh wants to know if she can give pt a Potassium pil? Sh said he had to have something.l.

## 2018-10-21 NOTE — Telephone Encounter (Signed)
Spoke with Kathie Rhodes, [facility], and reviewed Dr. Earmon Phoenix advice with her. She verbalized understanding and agreement and is aware that I am sending a new Rx for the Lasix so that patient can get a sooner refill. She will continue to counsel patient and family on low sodium diet. She states she will draw a BMET on Monday and will call back with results. I thanked her for her help and she verbalized gratitude for the advice.

## 2018-10-21 NOTE — Telephone Encounter (Signed)
Spoke with Kathie Rhodes, [facility] who is with patient and states he has larger abdominal girth and lower extremity swelling, edema since her last visit with him on Saturday. Reports 10 lb weight gain from Saturday, has decreased breath sounds bilaterally. Patient's wife admits to high sodium diet. Patient reports good urinary output following Lasix. Kathie Rhodes reports compliance with medications; patient not currently taking potassium (in d/c summary it is listed as a med to stop). Patient had OT yesterday and states he noticed worsening edema after that time. I advised I will forward message to Dr. Excell Seltzer for advice. Kathie Rhodes requests a call back to her and that she will contact the patient. She thanked me for my call.

## 2018-10-22 ENCOUNTER — Telehealth: Payer: Self-pay | Admitting: Cardiovascular Disease

## 2018-10-22 LAB — GLUCOSE, CAPILLARY
Glucose-Capillary: 118 mg/dL — ABNORMAL HIGH (ref 70–99)
Glucose-Capillary: 150 mg/dL — ABNORMAL HIGH (ref 70–99)
Glucose-Capillary: 165 mg/dL — ABNORMAL HIGH (ref 70–99)

## 2018-10-22 NOTE — Telephone Encounter (Signed)
I believe this message is the intended OT care plan, pt was in [facility] for respiratory arrest and DC with [facility], I tried to reach the OT and left him two messages,  I will forward to dr/nurse, unsure if Dr Excell Seltzer is the signing MD for plan of care.

## 2018-10-22 NOTE — Telephone Encounter (Signed)
I was seeing him as the consulting cardiologist. I shouldn't be the point person on things like OT. thx

## 2018-10-22 NOTE — Telephone Encounter (Signed)
New message   OT Plan of Care : 1 time a week for one week, 0 times a week for 1 week, 1 time a week one week, 0 times a week for one week and 1 time a week for 2 weeks for ADL Transfers exercise.   The family picked up medication Hydrocodone/acetaminophen 5/325 prescribed by the hospital on the hospital discharge list. The prescribing dr is Rito Ehrlich. Family picked up the medication on 10/19/2018. The Family may discontinue OT.

## 2018-10-22 NOTE — Telephone Encounter (Signed)
OT was contacted and informed.

## 2018-10-29 ENCOUNTER — Telehealth: Payer: Self-pay | Admitting: Cardiovascular Disease

## 2018-10-29 NOTE — Telephone Encounter (Signed)
New message   Pt c/o swelling: STAT is pt has developed SOB within 24 hours  1) How much weight have you gained and in what time span? Per Kathie Rhodes has gained 3 lbs within 2 days  2) If swelling, where is the swelling located? Abdomen and lower extremities  3) Are you currently taking a fluid pill? Yes   4) Are you currently SOB?no   5) Do you have a log of your daily weights (if so, list)?[facility] nurses can provide a log if needed   6) Have you gained 3 pounds in a day or 5 pounds in a week?yes   7) Have you traveled recently? No

## 2018-10-29 NOTE — Telephone Encounter (Signed)
Spoke with Kathie Rhodes, [facility] nurse. She states the patient has LE swelling and some abdominal swelling as well. She reports weight gain of 3 pounds in 2 days. She states he has no breathing issues and actually feels "fine." She is concerned because they eat out a lot and she states she has counseled them to avoid salty take out food.  The patient has a scheduled appointment with Cardiology next week. Informed Kathie Rhodes the patient will be rescheduled to this week.  Called the patient to schedule an e-visit for tomorrow or Friday. Left message to call back.

## 2018-10-30 ENCOUNTER — Encounter: Payer: Self-pay | Admitting: Cardiology

## 2018-10-30 ENCOUNTER — Telehealth: Payer: Self-pay | Admitting: Licensed Clinical Social Worker

## 2018-10-30 ENCOUNTER — Telehealth (INDEPENDENT_AMBULATORY_CARE_PROVIDER_SITE_OTHER): Payer: Medicare Other | Admitting: Cardiology

## 2018-10-30 VITALS — BP 120/60 | HR 80 | Temp 98.4°F | Resp 18 | Ht 67.0 in | Wt 232.0 lb

## 2018-10-30 DIAGNOSIS — I10 Essential (primary) hypertension: Secondary | ICD-10-CM

## 2018-10-30 NOTE — Telephone Encounter (Signed)
Spoke with the patient and his wife, who agree to have evisit today to discuss swelling and weight gain.  Consent below. They do not have a smart phone so a call will have to be made. They have no way to check vital signs at home, so a call to [facility] nurse was made.   Vital signs yesterday: BP 120/60 HR 80 RR 18 Pulse-ox: 96% Temp 98.4 Weight:  4/29 - 232 lbs. 4/27 - 229 lbs. 4/24 - 228 lbs.       Virtual Visit Pre-Appointment Phone Call Confirm consent - "In the setting of the current Covid19 crisis, you are scheduled for a (phone or video) visit with your provider on (date) at (time).  Just as we do with many in-office visits, in order for you to participate in this visit, we must obtain consent.  If you'd like, I can send this to your mychart (if signed up) or email for you to review.  Otherwise, I can obtain your verbal consent now.  All virtual visits are billed to your insurance company just like a normal visit would be.  By agreeing to a virtual visit, we'd like you to understand that the technology does not allow for your provider to perform an examination, and thus may limit your provider's ability to fully assess your condition. If your provider identifies any concerns that need to be evaluated in person, we will make arrangements to do so.  Finally, though the technology is pretty good, we cannot assure that it will always work on either your or our end, and in the setting of a video visit, we may have to convert it to a phone-only visit.  In either situation, we cannot ensure that we have a secure connection.  Are you willing to proceed?" STAFF: Did the patient verbally acknowledge consent to telehealth visit? Document YES/NO here: YES     TELEPHONE CALL NOTE  Burchell Gonya has been deemed a candidate for a follow-up tele-health visit to limit community exposure during the Covid-19 pandemic. I spoke with the patient via phone to ensure availability of phone/video source, confirm  preferred email & phone number, and discuss instructions and expectations.  I reminded Lawren Florer to be prepared with any vital sign and/or heart rhythm information that could potentially be obtained via home monitoring, at the time of his visit. I reminded Nikalus Laske to expect a phone call prior to his visit.

## 2018-10-30 NOTE — Progress Notes (Addendum)
Virtual Visit via Telephone Note   This visit type was conducted due to national recommendations for restrictions regarding the COVID-19 Pandemic (e.g. social distancing) in an effort to limit this patient's exposure and mitigate transmission in our community.  Due to his co-morbid illnesses, this patient is at least at moderate risk for complications without adequate follow up.  This format is felt to be most appropriate for this patient at this time.  The patient did not have access to video technology/had technical difficulties with video requiring transitioning to audio format only (telephone).  All issues noted in this document were discussed and addressed.  No physical exam could be performed with this format.  Please refer to the patient's chart for his  consent to telehealth for [facility].   Evaluation Performed:  Follow-up visit  Date:  10/30/2018   ID:  [name], DOB 1950/08/13, MRN [id number]  Patient Location: Home Provider Location: Office  PCP:  Clinic, Medical, MD (Inactive)  Cardiologist:  Tonny Bollman, MD  Electrophysiologist:  None   Chief Complaint:  Heart failure  History of Present Illness:    [name] is a 68 y.o. male with a hx of HTN, HL, DM, AAA, mild AI and mild non-obstructive CAD. Pt was recently hospitalized for pneumonia. Has been in for pneumonia 4 times needing intubation. He had subsequent PEA cardiac arrest in the ambulance with this last episode. This was felt to be related hypoxia. Echo showed LVEF of 45-50% (similar to prior) and mild LVH. There is mild dilatation of the aortic root measuring 65mm and of the ascending aorta measuring 44 mm. He was diuresed. He had AKI and home ARB was held.   Today his wife is present with him for phone visit. He called yesterday with weight gain of 3 pounds and lower extremity edema. He took the extra lasix as directed last week. He initially had lost 8 or 10 lbs and has regained about 3 pounds. He still  has lower leg edema but not as bad as it was. He says he is a lot better than when he left the hospital. He is walking around the house now without the walker.   He denies chest pain/pressure. He has some residual soreness related to CPR. He had heart burn yesterday but none today. He is no longer using oxygen. He occ uses it briefly. He denies orthopena, PND lightheadeness of dizziness. Overall he feels like he has been slowly improving.   He has never smoked but his wife says he has COPD probably from second hand smoke as he was raised with a family of smokers.   He is seen by [facility] from [place]. I called and spoke to her. She had just been out to see him yesterday. She says that he is looking better. She was a little concerned about the 3 pound wt gain but says that he is breathing better and his swelling has decreased.   Vital signs yesterday: BP 120/60 HR 80 RR 18 Pulse-ox: 96% Temp 98.4 Weight:  4/29 - 232 lbs. 4/27 - 229 lbs. 4/24 - 228 lbs.  The patient does not have symptoms concerning for COVID-19 infection (fever, chills, cough, or new shortness of breath).    Past Medical History:  Diagnosis Date  . 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 Diabetes mellitus 10/17/2011   newly dx today  . Edema leg    right leg has leaky  valve and right foot swells  . Emphysema   . Excessive ear wax   . Fatty liver   . Hernia    near navel  . Hyperlipidemia   . Hypertension   . Leaky heart valve   . Neuropathy   . Rheumatic fever   . Slow urinary stream   . TIA (transient ischemic attack)    Past Surgical History:  Procedure Laterality Date  . CIRCUMCISION    . LITHOTRIPSY       Current Meds  Medication Sig  . albuterol (PROVENTIL HFA;VENTOLIN HFA) 108 (90 Base) MCG/ACT inhaler Inhale 2 puffs into the lungs every 6 (six) hours as needed for wheezing or shortness of breath.  Marland Kitchen. aspirin EC 81 MG tablet Take 81 mg by mouth  daily.  Marland Kitchen. atorvastatin (LIPITOR) 40 MG tablet Take 1 tablet (40 mg total) by mouth every evening.  . carvedilol (COREG) 3.125 MG tablet Take 1 tablet (3.125 mg total) by mouth 2 (two) times daily with a meal.  . finasteride (PROSCAR) 5 MG tablet Take 5 mg by mouth daily.  . furosemide (LASIX) 40 MG tablet Take 1 tablet (40 mg total) by mouth 2 (two) times daily.  Marland Kitchen. gabapentin (NEURONTIN) 300 MG capsule Take 300 mg by mouth 4 (four) times daily.   Marland Kitchen. glipiZIDE (GLUCOTROL XL) 5 MG 24 hr tablet Take 5 mg by mouth daily.  Marland Kitchen. HYDROcodone-acetaminophen (NORCO/VICODIN) 5-325 MG tablet Take 1 tablet by mouth every 6 (six) hours as needed for moderate pain.  . hydrOXYzine (VISTARIL) 50 MG capsule Take 50 mg by mouth 3 (three) times daily.  . montelukast (SINGULAIR) 10 MG tablet Take 10 mg by mouth at bedtime.  . Multiple Vitamin (MULTIVITAMIN) tablet Take 1 tablet by mouth daily.  . pantoprazole (PROTONIX) 40 MG tablet Take 1 tablet (40 mg total) by mouth daily.  Marland Kitchen. spironolactone (ALDACTONE) 25 MG tablet Take 1 tablet (25 mg total) by mouth daily.  . tamsulosin (FLOMAX) 0.4 MG CAPS capsule Take 0.4 mg by mouth daily.     Allergies:   Cashew nut oil and Percocet [oxycodone-acetaminophen]   Social History   Tobacco Use  . Smoking status: Never Smoker  . Smokeless tobacco: Never Used  Substance Use Topics  . Alcohol use: No  . Drug use: No     Family Hx: The patient's family history includes Aneurysm in his mother; Coronary artery disease in his brother, brother, and sister; Emphysema in his father and mother.  ROS:   Please see the history of present illness.     All other systems reviewed and are negative.   Prior CV studies:   The following studies were reviewed today:  Echocardiogram 10/16/18  1. The left ventricle has mildly reduced systolic function, with an ejection fraction of 45-50%. The cavity size was normal. There is mild concentric left ventricular hypertrophy. Left ventricular  diastolic Doppler parameters are consistent with  impaired relaxation. There is abnormal septal motion consistent with left bundle branch block. Left ventricular diffuse hypokinesis.  2. The right ventricle has normal systolc function. The cavity was normal. There is no increase in right ventricular wall thickness.  3. There is severe mitral annular calcification present.  4. The aortic valve was not well visualized Moderate thickening of the aortic valve Moderate calcification of the aortic valve. Aortic valve regurgitation is mild by color flow Doppler.  5. There is mild dilatation of the aortic root measuring 39mm and of the ascending aorta measuring 44 mm.  FINDINGS  Left Ventricle: The left ventricle has mildly reduced systolic function, with an ejection fraction of 45-50%. The cavity size was normal. There is mild concentric left ventricular hypertrophy. Left ventricular diastolic Doppler parameters are consistent  with impaired relaxation (grade I). Normal left ventricular filling pressures There is abnormal (paradoxical) septal motion, consistent with left bundle branch block. Left ventricular diffuse hypokinesis.     Labs/Other Tests and Data Reviewed:    EKG:  An ECG dated 10/11/2018 was personally reviewed today and demonstrated:  Left BBB  Recent Labs: 10/11/2018: ALT 35; B Natriuretic Peptide 342.4 10/12/2018: Magnesium 1.9 10/17/2018: BUN 47; Creatinine, Ser 1.41; Hemoglobin 10.3; Platelets 284; Potassium 4.1; Sodium 135   Recent Lipid Panel Lab Results  Component Value Date/Time   CHOL 165 10/16/2018 04:52 AM   TRIG 204 (H) 10/16/2018 04:52 AM   HDL 33 (L) 10/16/2018 04:52 AM   CHOLHDL 5.0 10/16/2018 04:52 AM   LDLCALC 91 10/16/2018 04:52 AM    Wt Readings from Last 3 Encounters:  10/30/18 232 lb (105.2 kg)  10/17/18 223 lb 12.3 oz (101.5 kg)  06/24/18 232 lb (105.2 kg)     Objective:    Vital Signs:  BP 120/60   Pulse 80   Temp 98.4 F (36.9 C)   Resp 18   Ht   (1.702 m)   Wt 232 lb (105.2 kg)   SpO2 96%   BMI 36.34 kg/m    VITAL SIGNS:  reviewed GEN:  no acute distress RESPIRATORY:  Breathing normally with no audible wheezes or cough. Able to speak in complete sentences. NEURO:  alert and oriented x 3, no obvious focal deficit PSYCH:  normal affect  ASSESSMENT & PLAN:    1. Acute on chronic CHF -Pt had PEA arrest in setting of pneumonia with hypoxia. Was diuresed in hospital.  -Echo showed LVEF of 45-50% (similar to prior) and mild LVH. There is mild dilatation of the aortic root measuring 39mm and of the ascending aorta measuring 44 mm. -Was discharged on lasix 40 mg daily (up from prior of 20 mg). Pt required extra dosing and increase in baseline dosing to 40 mg BID. He did have decrease in wt and edema. Breathing better than at discharge. He had lost 8-10 lbs and now back up 3 pounds. I feel that he is stable and will continue with current dosing. HHN is seeing him twice a week and we discussed plan.  -CHF education provided including restriction of salt and fluids.  Recommend he weigh himself daily and call us for weight gain over 3 pounds overnight or 5 pounds in a 1 week time span. -Will plan for close follow up in 3 weeks.  -Pt states he has trouble getting heart healthy meals and would like to sign up for the Cardiovascular Food project. I have sent a request.   2. Hyperlipidemia -On atorvastatin 40 mg daily. LDL 91. Continue current therapy.   3. DM type 2  -Hgb A1c 10.6 back in April 2019, now improved to 8.0.  4. AKI -Scr 1.49 in hospital. ARB on hold.  -Pt required extra diuretic and increase in baseline dose.  -Will check [facility] via home health nurse. This has been arranged for Monday. -If his renal function remains stable would plan to initiate him on an ACE/ARB at some point.     He is trying to limit salt in his diet but has trouble with that. He has been getting teaching through [place].   COVID-19  Education: The  signs and symptoms of COVID-19 were discussed with the patient and how to seek care for testing (follow up with PCP or arrange E-visit).  The importance of social distancing was discussed today.  Time:   Today, I have spent 25 minutes with the patient with telehealth technology discussing the above problems.     Medication Adjustments/Labs and Tests Ordered: Current medicines are reviewed at length with the patient today.  Concerns regarding medicines are outlined above.   Tests Ordered: Orders Placed This Encounter  Procedures  . Basic metabolic panel    Medication Changes: No orders of the defined types were placed in this encounter.   Disposition:  Follow up in 3 week(s)  Signed, Berton Bon, NP  10/30/2018 5:02 PM    [facility]

## 2018-10-30 NOTE — Telephone Encounter (Signed)
F/U Message          Patient is returning Edythe Lynn call would like a call back.

## 2018-10-30 NOTE — Patient Instructions (Addendum)
Medication Instructions:  Your physician recommends that you continue on your current medications as directed. Please refer to the Current Medication list given to you today.  If you need a refill on your cardiac medications before your next appointment, please call your pharmacy.   Lab work: None  If you have labs (blood work) drawn today and your tests are completely normal, you will receive your results only by: [name]. MyChart Message (if you have MyChart) OR . A paper copy in the mail If you have any lab test that is abnormal or we need to change your treatment, we will call you to review the results.  Testing/Procedures: None  Follow-Up: At [facility], you and your health needs are our priority.  As part of our continuing mission to provide you with exceptional heart care, we have created designated Provider Care Teams.  These Care Teams include your primary Cardiologist (physician) and Advanced Practice Providers (APPs -  Physician Assistants and Nurse Practitioners) who all work together to provide you with the care you need, when you need it. You will need a follow up appointment in:  3 weeks.  Please call our office 2 months in advance to schedule this appointment.  You may see Tonny BollmanMichael Cooper, MD or one of the following Advanced Practice Providers on your designated Care Team: Tereso NewcomerScott Weaver, PA-C Vin Oak[place]Bhagat, [place] JerseyPA-C . Berton BonJanine Melodye Swor, NP  Any Other Special Instructions Will Be Listed Below (If Applicable).   Do the following things EVERY DAY:   1. Weigh yourself EVERY morning after you go to the bathroom but before you eat or drink anything. Write this number down in a weight log/diary. If you gain 3 pounds overnight or 5 pounds in a week, call the office.   2. Take your medicines as prescribed. If you have concerns about your medications, please call us before you stop taking them.    3. Eat low salt foods-Limit salt (sodium) to 2000 mg per day. This will help prevent your body  from holding onto fluid. Read food labels as many processed foods have a lot of sodium, especially canned goods and prepackaged meats. If you would like some assistance choosing low sodium foods, we would be happy to set you up with a nutritionist.   4. Stay as active as you can everyday. Staying active will give you more energy and make your muscles stronger. Start with 5 minutes at a time and work your way up to 30 minutes a day. Break up your activities--do some in the morning and some in the afternoon. Start with 3 days per week and work your way up to 5 days as you can.  If you have chest pain, feel short of breath, dizzy, or lightheaded, STOP. If you don't feel better after a short rest, call 911. If you do feel better, call the office to let us know you have symptoms with exercise.   5. Limit all fluids for the day to less than 2 liters. Fluid includes all drinks, coffee, juice, ice chips, soup, jello, and all other liquids.   Coronavirus (COVID-19) Are you at risk?  Are you at risk for the Coronavirus (COVID-19)?  To be considered HIGH RISK for Coronavirus (COVID-19), you have to meet the following criteria:  . Traveled to [place], [place], [place], [place] or [place]; or in the [place] to [place], [place], [place], or [place]; and have fever, cough, and shortness of breath within the last 2 weeks of travel  OR . Been in close contact with a person diagnosed with COVID-19 within the last 2 weeks and have fever, cough, and shortness of breath . IF YOU DO NOT MEET THESE CRITERIA, YOU ARE CONSIDERED LOW RISK FOR COVID-19.  What to do if you are HIGH RISK for COVID-19?  [name] If you are having a medical emergency, call 911. . Seek medical care right away. Before you go to a doctor's office, urgent care or emergency department, call ahead and tell them about your recent travel, contact with someone diagnosed with COVID-19, and your symptoms. You should receive instructions from your  physician's office regarding next steps of care.  . When you arrive at healthcare provider, tell the healthcare staff immediately you have returned from visiting [place], [place], [place], [place] or [place]; or traveled in the [place] to [place], [place], [place], or [place]; in the last two weeks or you have been in close contact with a person diagnosed with COVID-19 in the last 2 weeks.   . Tell the health care staff about your symptoms: fever, cough and shortness of breath. . After you have been seen by a medical provider, you will be either: o Tested for (COVID-19) and discharged home on quarantine except to seek medical care if symptoms worsen, and asked to  - Stay home and avoid contact with others until you get your results (4-5 days)  - Avoid travel on public transportation if possible (such as bus, train, or airplane) or o Sent to the Emergency Department by EMS for evaluation, COVID-19 testing, and possible admission depending on your condition and test results.  What to do if you are LOW RISK for COVID-19?  Reduce your risk of any infection by using the same precautions used for avoiding the common cold or flu:  [name] Wash your hands often with soap and warm water for at least 20 seconds.  If soap and water are not readily available, use an alcohol-based hand sanitizer with at least 60% alcohol.  . If coughing or sneezing, cover your mouth and nose by coughing or sneezing into the elbow areas of your shirt or coat, into a tissue or into your sleeve (not your hands). . Avoid shaking hands with others and consider head nods or verbal greetings only. . Avoid touching your eyes, nose, or mouth with unwashed hands.  . Avoid close contact with people who are sick. . Avoid places or events with large numbers of people in one location, like concerts or sporting events. . Carefully consider travel plans you have or are making. . If you are planning any travel outside or inside the [place],  visit the CDC's Travelers' Health webpage for the latest health notices. . If you have some symptoms but not all symptoms, continue to monitor at home and seek medical attention if your symptoms worsen. . If you are having a medical emergency, call 911.   ADDITIONAL HEALTHCARE OPTIONS FOR PATIENTS  [facility] / e-Visit: [email or web address]         [facility]: [phone]  [facility]: [phone]                   [facility]: [phone]

## 2018-10-30 NOTE — Telephone Encounter (Signed)
CSW referred to contact patient to inquire about food insecurity during this health crisis. Patient's wife reported need and agreeable to weekly delivery from [employer] as they are raising two grandchildren and very much in need. Patient's wife informed that delivery will be left on front door with no face to face contact with delivery person. Patient's wife agreeable to plan and grateful for the assistance. Lasandra Beech, LCSW, CCSW-MCS [phone]

## 2018-11-05 ENCOUNTER — Telehealth: Payer: Self-pay | Admitting: Physician Assistant

## 2018-11-07 ENCOUNTER — Telehealth: Payer: Self-pay | Admitting: Licensed Clinical Social Worker

## 2018-11-07 ENCOUNTER — Telehealth: Payer: Self-pay | Admitting: Cardiovascular Disease

## 2018-11-07 NOTE — Telephone Encounter (Signed)
CSW contacted patient to follow up on weekly food delivery package. Patient informed of delivery time and no face to face contact during delivery. Message left as no answer.  CSW continues to follow for supportive needs. Jackie Akirra Lacerda, LCSW, CCSW-MCS [phone] 

## 2018-11-07 NOTE — Telephone Encounter (Signed)
Returned pts wife, Kendal Hymen, [place] on file.  She was wanting to know if pt could go back on Metformin.  She was advised to reach out to pt's pcp.  She thanked me for the call back.

## 2018-11-07 NOTE — Telephone Encounter (Signed)
Wife of Pt wants to know if she can re-introduce Metformin into her Husband's medicine. His blood sugar level is above 200. She has some in the house already. She really just wants to get her Husband's blood sugar under control

## 2018-11-14 ENCOUNTER — Telehealth: Payer: Self-pay | Admitting: Licensed Clinical Social Worker

## 2018-11-14 NOTE — Telephone Encounter (Signed)
CSW contacted patient to follow up on weekly food delivery package. Patient informed of delivery time and no face to face contact during delivery. Message left as no answer.  CSW continues to follow for supportive needs. Jackie Broedy Osbourne, LCSW, CCSW-MCS [phone] 

## 2018-11-19 ENCOUNTER — Telehealth (INDEPENDENT_AMBULATORY_CARE_PROVIDER_SITE_OTHER): Payer: Medicare Other | Admitting: Cardiology

## 2018-11-19 ENCOUNTER — Encounter: Payer: Self-pay | Admitting: Cardiology

## 2018-11-19 ENCOUNTER — Other Ambulatory Visit: Payer: Self-pay

## 2018-11-19 VITALS — Ht 67.0 in | Wt 230.0 lb

## 2018-11-19 DIAGNOSIS — E785 Hyperlipidemia, unspecified: Secondary | ICD-10-CM

## 2018-11-19 DIAGNOSIS — N179 Acute kidney failure, unspecified: Secondary | ICD-10-CM

## 2018-11-19 DIAGNOSIS — I872 Venous insufficiency (chronic) (peripheral): Secondary | ICD-10-CM

## 2018-11-19 DIAGNOSIS — I5022 Chronic systolic (congestive) heart failure: Secondary | ICD-10-CM | POA: Diagnosis not present

## 2018-11-19 DIAGNOSIS — I5023 Acute on chronic systolic (congestive) heart failure: Secondary | ICD-10-CM | POA: Insufficient documentation

## 2018-11-19 DIAGNOSIS — E119 Type 2 diabetes mellitus without complications: Secondary | ICD-10-CM

## 2018-11-19 MED ORDER — CARVEDILOL 3.125 MG PO TABS: 3.1250 mg | ORAL_TABLET | Freq: Two times a day (BID) | ORAL | 3 refills | Status: DC

## 2018-11-19 NOTE — Progress Notes (Signed)
thx Coralee North!

## 2018-11-19 NOTE — Patient Instructions (Addendum)
Medication Instructions:  Your physician recommends that you continue on your current medications as directed. Please refer to the Current Medication list given to you today.  If you need a refill on your cardiac medications before your next appointment, please call your pharmacy.   Lab work: None  If you have labs (blood work) drawn today and your tests are completely normal, you will receive your results only by: Marland Kitchen MyChart Message (if you have MyChart) OR . A paper copy in the mail If you have any lab test that is abnormal or we need to change your treatment, we will call you to review the results.  Testing/Procedures: None  Follow-Up: At [facility], you and your health needs are our priority.  As part of our continuing mission to provide you with exceptional heart care, we have created designated Provider Care Teams.  These Care Teams include your primary Cardiologist (physician) and Advanced Practice Providers (APPs -  Physician Assistants and Nurse Practitioners) who all work together to provide you with the care you need, when you need it. You will need a follow up appointment in:  3 months.  Please call our office 2 months in advance to schedule this appointment.  You may see Tonny Bollman, MD or one of the following Advanced Practice Providers on your designated Care Team: Tereso Newcomer, PA-C [place], [place] . Berton Bon, NP  Any Other Special Instructions Will Be Listed Below (If Applicable).   Do the following things EVERY DAY:   1. Weigh yourself EVERY morning after you go to the bathroom but before you eat or drink anything. Write this number down in a weight log/diary. If you gain 3 pounds overnight or 5 pounds in a week, call the office.   2. Take your medicines as prescribed. If you have concerns about your medications, please call us before you stop taking them.    3. Eat low salt foods-Limit salt (sodium) to 2000 mg per day. This will help prevent your body  from holding onto fluid. Read food labels as many processed foods have a lot of sodium, especially canned goods and prepackaged meats. If you would like some assistance choosing low sodium foods, we would be happy to set you up with a nutritionist.   4. Stay as active as you can everyday. Staying active will give you more energy and make your muscles stronger. Start with 5 minutes at a time and work your way up to 30 minutes a day. Break up your activities--do some in the morning and some in the afternoon. Start with 3 days per week and work your way up to 5 days as you can.  If you have chest pain, feel short of breath, dizzy, or lightheaded, STOP. If you don't feel better after a short rest, call 911. If you do feel better, call the office to let us know you have symptoms with exercise.   5. Limit all fluids for the day to less than 2 liters. Fluid includes all drinks, coffee, juice, ice chips, soup, jello, and all other liquids.   ==========================================================   DASH Eating Plan DASH stands for "Dietary Approaches to Stop Hypertension." The DASH eating plan is a healthy eating plan that has been shown to reduce high blood pressure (hypertension). It may also reduce your risk for type 2 diabetes, heart disease, and stroke. The DASH eating plan may also help with weight loss. What are tips for following this plan?  General guidelines  Avoid eating more  than 2,300 mg (milligrams) of salt (sodium) a day. If you have hypertension, you may need to reduce your sodium intake to 1,500 mg a day.  Limit alcohol intake to no more than 1 drink a day for nonpregnant women and 2 drinks a day for men. One drink equals 12 oz of beer, 5 oz of wine, or 1 oz of hard liquor.  Work with your health care provider to maintain a healthy body weight or to lose weight. Ask what an ideal weight is for you.  Get at least 30 minutes of exercise that causes your heart to beat faster (aerobic  exercise) most days of the week. Activities may include walking, swimming, or biking.  Work with your health care provider or diet and nutrition specialist (dietitian) to adjust your eating plan to your individual calorie needs. Reading food labels   Check food labels for the amount of sodium per serving. Choose foods with less than 5 percent of the Daily Value of sodium. Generally, foods with less than 300 mg of sodium per serving fit into this eating plan.  To find whole grains, look for the word "whole" as the first word in the ingredient list. Shopping  Buy products labeled as "low-sodium" or "no salt added."  Buy fresh foods. Avoid canned foods and premade or frozen meals. Cooking  Avoid adding salt when cooking. Use salt-free seasonings or herbs instead of table salt or sea salt. Check with your health care provider or pharmacist before using salt substitutes.  Do not fry foods. Cook foods using healthy methods such as baking, boiling, grilling, and broiling instead.  Cook with heart-healthy oils, such as olive, canola, soybean, or sunflower oil. Meal planning  Eat a balanced diet that includes: ? 5 or more servings of fruits and vegetables each day. At each meal, try to fill half of your plate with fruits and vegetables. ? Up to 6-8 servings of whole grains each day. ? Less than 6 oz of lean meat, poultry, or fish each day. A 3-oz serving of meat is about the same size as a deck of cards. One egg equals 1 oz. ? 2 servings of low-fat dairy each day. ? A serving of nuts, seeds, or beans 5 times each week. ? Heart-healthy fats. Healthy fats called Omega-3 fatty acids are found in foods such as flaxseeds and coldwater fish, like sardines, salmon, and mackerel.  Limit how much you eat of the following: ? Canned or prepackaged foods. ? Food that is high in trans fat, such as fried foods. ? Food that is high in saturated fat, such as fatty meat. ? Sweets, desserts, sugary drinks,  and other foods with added sugar. ? Full-fat dairy products.  Do not salt foods before eating.  Try to eat at least 2 vegetarian meals each week.  Eat more home-cooked food and less restaurant, buffet, and fast food.  When eating at a restaurant, ask that your food be prepared with less salt or no salt, if possible. What foods are recommended? The items listed may not be a complete list. Talk with your dietitian about what dietary choices are best for you. Grains Whole-grain or whole-wheat bread. Whole-grain or whole-wheat pasta. Brown rice. Orpah Cobb. Bulgur. Whole-grain and low-sodium cereals. Pita bread. Low-fat, low-sodium crackers. Whole-wheat flour tortillas. Vegetables Fresh or frozen vegetables (raw, steamed, roasted, or grilled). Low-sodium or reduced-sodium tomato and vegetable juice. Low-sodium or reduced-sodium tomato sauce and tomato paste. Low-sodium or reduced-sodium canned vegetables. Fruits All fresh, dried,  or frozen fruit. Canned fruit in natural juice (without added sugar). Meat and other protein foods Skinless chicken or Mal[place]urkey. Ground chicken or [place]key. Pork with fat trimmed off. Fish and seafood. Egg whites. Dried beans, peas, or lentils. Unsalted nuts, nut butters, and seeds. Unsalted canned beans. Lean cuts of beef with fat trimmed off. Low-sodium, lean deli meat. Dairy Low-fat (1%) or fat-free (skim) milk. Fat-free, low-fat, or reduced-fat cheeses. Nonfat, low-sodium ricotta or cottage cheese. Low-fat or nonfat yogurt. Low-fat, low-sodium cheese. Fats and oils Soft margarine without trans fats. Vegetable oil. Low-fat, reduced-fat, or light mayonnaise and salad dressings (reduced-sodium). Canola, safflower, olive, soybean, and sunflower oils. Avocado. Seasoning and other foods Herbs. Spices. Seasoning mixes without salt. Unsalted popcorn and pretzels. Fat-free sweets. What foods are not recommended? The items listed may not be a complete list. Talk with your  dietitian about what dietary choices are best for you. Grains Baked goods made with fat, such as croissants, muffins, or some breads. Dry pasta or rice meal packs. Vegetables Creamed or fried vegetables. Vegetables in a cheese sauce. Regular canned vegetables (not low-sodium or reduced-sodium). Regular canned tomato sauce and paste (not low-sodium or reduced-sodium). Regular tomato and vegetable juice (not low-sodium or reduced-sodium). Rosita FirePickles. Olives. Fruits Canned fruit in a light or heavy syrup. Fried fruit. Fruit in cream or butter sauce. Meat and other protein foods Fatty cuts of meat. Ribs. Fried meat. Tomasa BlaseBacon. Sausage. Bologna and other processed lunch meats. Salami. Fatback. Hotdogs. Bratwurst. Salted nuts and seeds. Canned beans with added salt. Canned or smoked fish. Whole eggs or egg yolks. Chicken or [place]urkey with skin. Dairy Whole or 2% milk, cream, and half-and-half. Whole or full-fat cream cheese. Whole-fat or sweetened yogurt. Full-fat cheese. Nondairy creamers. Whipped toppings. Processed cheese and cheese spreads. Fats and oils Butter. Stick margarine. Lard. Shortening. Ghee. Bacon fat. Tropical oils, such as coconut, palm kernel, or palm oil. Seasoning and other foods Salted popcorn and pretzels. Onion salt, garlic salt, seasoned salt, table salt, and sea salt. Worcestershire sauce. Tartar sauce. Barbecue sauce. Teriyaki sauce. Soy sauce, including reduced-sodium. Steak sauce. Canned and packaged gravies. Fish sauce. Oyster sauce. Cocktail sauce. Horseradish that you find on the shelf. Ketchup. Mustard. Meat flavorings and tenderizers. Bouillon cubes. Hot sauce and Tabasco sauce. Premade or packaged marinades. Premade or packaged taco seasonings. Relishes. Regular salad dressings. Where to find more information:  National Heart, Lung, and Blood Institute: PopSteam.iswww.nhlbi.nih.gov  American Heart Association: www.heart.org Summary  The DASH eating plan is a healthy eating plan that has  been shown to reduce high blood pressure (hypertension). It may also reduce your risk for type 2 diabetes, heart disease, and stroke.  With the DASH eating plan, you should limit salt (sodium) intake to 2,300 mg a day. If you have hypertension, you may need to reduce your sodium intake to 1,500 mg a day.  When on the DASH eating plan, aim to eat more fresh fruits and vegetables, whole grains, lean proteins, low-fat dairy, and heart-healthy fats.  Work with your health care provider or diet and nutrition specialist (dietitian) to adjust your eating plan to your individual calorie needs. This information is not intended to replace advice given to you by your health care provider. Make sure you discuss any questions you have with your health care provider. Document Released: 06/07/2011 Document Revised: 06/11/2016 Document Reviewed: 06/11/2016 Elsevier Interactive Patient Education  2019 [employer]  ================================================================= Coronavirus (COVID-19) Are you at risk?  Are you at risk for the Coronavirus (COVID-19)?  To be considered HIGH RISK for Coronavirus (COVID-19), you have to meet the following criteria:  . Traveled to [place], [place], [place], [place] or [place]; or in the [place] to [place], [place], [place], or [place]; and have fever, cough, and shortness of breath within the last 2 weeks of travel OR . Been in close contact with a person diagnosed with COVID-19 within the last 2 weeks and have fever, cough, and shortness of breath . IF YOU DO NOT MEET THESE CRITERIA, YOU ARE CONSIDERED LOW RISK FOR COVID-19.  What to do if you are HIGH RISK for COVID-19?  Marland Kitchen If you are having a medical emergency, call 911. . Seek medical care right away. Before you go to a doctor's office, urgent care or emergency department, call ahead and tell them about your recent travel, contact with someone diagnosed with COVID-19, and your symptoms. You  should receive instructions from your physician's office regarding next steps of care.  . When you arrive at healthcare provider, tell the healthcare staff immediately you have returned from visiting [place], [place], [place], [place] or [place]; or traveled in the [place] to [place], [place], [place], or [place]; in the last two weeks or you have been in close contact with a person diagnosed with COVID-19 in the last 2 weeks.   . Tell the health care staff about your symptoms: fever, cough and shortness of breath. . After you have been seen by a medical provider, you will be either: o Tested for (COVID-19) and discharged home on quarantine except to seek medical care if symptoms worsen, and asked to  - Stay home and avoid contact with others until you get your results (4-5 days)  - Avoid travel on public transportation if possible (such as bus, train, or airplane) or o Sent to the Emergency Department by EMS for evaluation, COVID-19 testing, and possible admission depending on your condition and test results.  What to do if you are LOW RISK for COVID-19?  Reduce your risk of any infection by using the same precautions used for avoiding the common cold or flu:  Marland Kitchen Wash your hands often with soap and warm water for at least 20 seconds.  If soap and water are not readily available, use an alcohol-based hand sanitizer with at least 60% alcohol.  . If coughing or sneezing, cover your mouth and nose by coughing or sneezing into the elbow areas of your shirt or coat, into a tissue or into your sleeve (not your hands). . Avoid shaking hands with others and consider head nods or verbal greetings only. . Avoid touching your eyes, nose, or mouth with unwashed hands.  . Avoid close contact with people who are sick. . Avoid places or events with large numbers of people in one location, like concerts or sporting events. . Carefully consider travel plans you have or are making. . If you are planning  any travel outside or inside the [place], visit the CDC's Travelers' Health webpage for the latest health notices. . If you have some symptoms but not all symptoms, continue to monitor at home and seek medical attention if your symptoms worsen. . If you are having a medical emergency, call 911.   ADDITIONAL HEALTHCARE OPTIONS FOR PATIENTS  [facility] / e-Visit: [email or web address]         [facility]: [phone]  [facility]: [phone]  [facility]: [phone]

## 2018-11-19 NOTE — Progress Notes (Signed)
Virtual Visit via Telephone Note   This visit type was conducted due to national recommendations for restrictions regarding the COVID-19 Pandemic (e.g. social distancing) in an effort to limit this patient's exposure and mitigate transmission in our community.  Due to his co-morbid illnesses, this patient is at least at moderate risk for complications without adequate follow up.  This format is felt to be most appropriate for this patient at this time.  The patient did not have access to video technology/had technical difficulties with video requiring transitioning to audio format only (telephone).  All issues noted in this document were discussed and addressed.  No physical exam could be performed with this format.  Please refer to the patient's chart for his  consent to telehealth for [facility].   Date:  11/19/2018   ID:  [name], DOB 07/08/1950, MRN [id number]  Patient Location: Home Provider Location: Office  PCP:  Clinic, Medical, MD (Inactive)  Cardiologist:  Tonny BollmanMichael Cooper, MD  Electrophysiologist:  None   Evaluation Performed:  Follow-Up Visit  Chief Complaint:  Follow up for heart failure  History of Present Illness:    [name] is a 68 y.o. male with a hx of HTN, HL, DM, AAA, mild AI and mild non-obstructive CAD. He also probably has COPD, although he has never smoked but he has had prolonged exposure to second hand smoke. Pt was recently hospitalized in 10/2018 for pneumonia. Has been in for pneumonia 4 times needing intubation. He had subsequent PEA cardiac arrest in the ambulance with this last episode and was intubated. This was felt to be related hypoxia. Echo showed LVEF of 45-50% (similar to prior) and mild LVH.There is mild dilatation of the aortic root measuring 39mm and of the ascending aorta measuring 44 mm. He was diuresed. He had AKI and home ARB was held.   I saw him by virtual visit on 10/30/2018 at which time he was doing better with his breathing and  LE edema. His wt had initially decreased by 8-10 pounds post hospital, but was up on that day by 3 lbs. Per the home health nurse, he looked better than he had I did not make any medications changes. I discussed diet, daily wts and when to report symptoms to our office with pt and the HHN. I also arranged for meal delivery through the cardiovascular food project.   On the phone today, [name] says that he improving a lot. His wife is also present and adds to the information obtained. He says he is now holding his head up better. He sleeps in a chair due to rib pain after CPR. He does not think he would have shrortnes of breath laying down, only rib pain. He has mild edema of lower legs. He has drainage from his right leg. Upon speaking with Jonne PlyBetty Wyrrick, [facility] nurse, she says that pt has chronic venous stasis and does not keep his feet elevated as she has repeatedly advised him to do. She notes that he has some radness and slight oozining. She is to see him tomorrow and reassess.    He is not longer on oxygen. [facility]. He is waling without the walker now, going to the mailbox and feeling stronger. He says that my advising him to get outside and walk around has done him a world of good. I am not sure if he is greatly improved like he says or just wanting to  paint a good picture.   He has had meals delivered which should be low sodium, heart halthy and he and his wife think that this has been very good for him. His wt has remained stable, around 230 lbs. His wife says that they do not check his BPbut home health nurse and PT (who was here this morning) say that his BP and pulse have been good.    The patient does not have symptoms concerning for COVID-19 infection (fever, chills, cough, or new shortness of breath).    Past Medical History:  Diagnosis Date  . 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  Diabetes mellitus 10/17/2011   newly dx today  . Edema leg    right leg has leaky valve and right foot swells  . Emphysema   . Excessive ear wax   . Fatty liver   . Hernia    near navel  . Hyperlipidemia   . Hypertension   . Leaky heart valve   . Neuropathy   . Rheumatic fever   . Slow urinary stream   . TIA (transient ischemic attack)    Past Surgical History:  Procedure Laterality Date  . CIRCUMCISION    . LITHOTRIPSY       Current Meds  Medication Sig  . albuterol (PROVENTIL HFA;VENTOLIN HFA) 108 (90 Base) MCG/ACT inhaler Inhale 2 puffs into the lungs every 6 (six) hours as needed for wheezing or shortness of breath.  Marland Kitchen aspirin EC 81 MG tablet Take 81 mg by mouth daily.  Marland Kitchen atorvastatin (LIPITOR) 40 MG tablet Take 1 tablet (40 mg total) by mouth every evening.  . carvedilol (COREG) 3.125 MG tablet Take 1 tablet (3.125 mg total) by mouth 2 (two) times daily with a meal.  . finasteride (PROSCAR) 5 MG tablet Take 5 mg by mouth daily.  . furosemide (LASIX) 40 MG tablet Take 1 tablet (40 mg total) by mouth 2 (two) times daily.  Marland Kitchen gabapentin (NEURONTIN) 300 MG capsule Take 300 mg by mouth 4 (four) times daily.   . hydrOXYzine (VISTARIL) 50 MG capsule Take 50 mg by mouth 3 (three) times daily.  . metFORMIN (GLUCOPHAGE) 1000 MG tablet Take 1 tablet by mouth 2 (two) times a day.  . montelukast (SINGULAIR) 10 MG tablet Take 10 mg by mouth at bedtime.  . Multiple Vitamin (MULTIVITAMIN) tablet Take 1 tablet by mouth daily.  . pantoprazole (PROTONIX) 40 MG tablet Take 1 tablet (40 mg total) by mouth daily.  Marland Kitchen spironolactone (ALDACTONE) 25 MG tablet Take 1 tablet (25 mg total) by mouth daily.  . tamsulosin (FLOMAX) 0.4 MG CAPS capsule Take 0.4 mg by mouth daily.     Allergies:   Cashew nut oil and Percocet [oxycodone-acetaminophen]   Social History   Tobacco Use  . Smoking status: Never Smoker  . Smokeless tobacco: Never Used  Substance Use Topics  . Alcohol use: No  . Drug use:  No     Family Hx: The patient's family history includes Aneurysm in his mother; Coronary artery disease in his brother, brother, and sister; Emphysema in his father and mother.  ROS:   Please see the history of present illness.     All other systems reviewed and are negative.   Prior CV studies:    The following studies were reviewed today:  Echocardiogram 10/16/18 1. The left ventricle has mildly reduced systolic function, with an ejection fraction of 45-50%. The cavity size was  normal. There is mild concentric left ventricular hypertrophy. Left ventricular diastolic Doppler parameters are consistent with  impaired relaxation. There is abnormal septal motion consistent with left bundle branch block. Left ventricular diffuse hypokinesis. 2. The right ventricle has normal systolc function. The cavity was normal. There is no increase in right ventricular wall thickness. 3. There is severe mitral annular calcification present. 4. The aortic valve was not well visualized Moderate thickening of the aortic valve Moderate calcification of the aortic valve. Aortic valve regurgitation is mild by color flow Doppler. 5. There is mild dilatation of the aortic root measuring 75mm and of the ascending aorta measuring 44 mm.  FINDINGS Left Ventricle: The left ventricle has mildly reduced systolic function, with an ejection fraction of 45-50%. The cavity size was normal. There is mild concentric left ventricular hypertrophy. Left ventricular diastolic Doppler parameters are consistent with impaired relaxation (grade I). Normal left ventricular filling pressures There is abnormal (paradoxical) septal motion, consistent with left bundle branch block. Left ventricular diffuse hypokinesis.  Labs/Other Tests and Data Reviewed:    EKG:  No ECG reviewed.  Recent Labs: 10/11/2018: ALT 35; B Natriuretic Peptide 342.4 10/12/2018: Magnesium 1.9 10/17/2018: BUN 47; Creatinine, Ser 1.41; Hemoglobin 10.3;  Platelets 284; Potassium 4.1; Sodium 135   Recent Lipid Panel Lab Results  Component Value Date/Time   CHOL 165 10/16/2018 04:52 AM   TRIG 204 (H) 10/16/2018 04:52 AM   HDL 33 (L) 10/16/2018 04:52 AM   CHOLHDL 5.0 10/16/2018 04:52 AM   LDLCALC 91 10/16/2018 04:52 AM    Wt Readings from Last 3 Encounters:  11/19/18 230 lb (104.3 kg)  10/30/18 232 lb (105.2 kg)  10/17/18 223 lb 12.3 oz (101.5 kg)     Objective:    Vital Signs:  Ht 5\' 7"  (1.702 m)   Wt 230 lb (104.3 kg)   BMI 36.02 kg/m    VITAL SIGNS:  reviewed GEN:  no acute distress RESPIRATORY:  No audible wheezes or cough. Able to speak in complete sentences.  NEURO:  alert and oriented x 3, no obvious focal deficit PSYCH:  normal affect  ASSESSMENT & PLAN:    1. Chronic systolic heart failure -EF 45-50% pre echo when hospitalized for pneumonia with PEA arrest in 10/2018. -On carvedilol 3.125 mg BID, spironolactone 25 mg qd, lasix 40 mg BID. Not on ARB due to renal function. -Wt is stable and breathing continues to improve. Pt reports gettign stronger and slightly more mobile.  -Continue current therapy. -Follow up in 3 months.   2. Hyperlipidemia -On atorvastatin 40 mg daily. LDL 91. Continue current therapy.   3. DM type 2 -Hgb A1c 10.6 back in April 2019, improved to 8.0 on 10/16/18.  4. AKI -SCR was 1.47 at hosp follow up on 11/03/18 through [facility]. Lasix had been increased pre hospital. ARB currently on hold.  -Will recheck BMet. I called and spoke to Jonne Ply, [place] home nurse and she will check BMet in the next 1-2 weeks and have results sent to [place].   5.  Chronic venous insufficiency -Pt encourage on low sodium diet and elevtea legs, per home health nurse.   COVID-19 Education: The signs and symptoms of COVID-19 were discussed with the patient and how to seek care for testing (follow up with PCP or arrange E-visit).  The importance of social distancing was discussed today.  Time:    Today, I have spent 18 minutes with the patient with telehealth technology discussing the above problems.  Medication Adjustments/Labs and Tests Ordered: Current medicines are reviewed at length with the patient today.  Concerns regarding medicines are outlined above.   Tests Ordered: No orders of the defined types were placed in this encounter.   Medication Changes: No orders of the defined types were placed in this encounter.   Disposition:  Follow up in 3 month(s)  Signed, Berton Bon, NP  11/19/2018 11:25 AM    [facility]

## 2018-11-20 ENCOUNTER — Telehealth: Payer: Self-pay | Admitting: Licensed Clinical Social Worker

## 2018-11-20 NOTE — Telephone Encounter (Signed)
CSW contacted patient to follow up on weekly food delivery package. Patient informed of change in delivery day due to Memorial Day to Wednesday and no face to face contact during delivery. Patient verbalizes understanding and grateful for the assistance.  CSW continues to follow for supportive needs. Jackie Micael Barb, LCSW, CCSW-MCS [phone]  

## 2018-11-28 ENCOUNTER — Telehealth: Payer: Self-pay | Admitting: Licensed Clinical Social Worker

## 2018-11-28 NOTE — Telephone Encounter (Signed)
CSW contacted patient to follow up on weekly food delivery package. Patient informed of no face to face contact during delivery. CSW discussed transition option for food delivery as the Covid 19 Food relief program will be ending on December 12, 2018. Patient verbalizes understanding and grateful for the assistance.  CSW continues to follow for supportive needs.  Jadence Kinlaw H. Whitnee Orzel, LCSW Clinical Social Worker [facility] Desk#: [phone] Cell#: [phone]   

## 2018-12-04 ENCOUNTER — Telehealth (HOSPITAL_COMMUNITY): Payer: Self-pay | Admitting: Licensed Clinical Social Worker

## 2018-12-04 NOTE — Telephone Encounter (Signed)
CSW contacted patient to follow up on weekly food delivery package. Patient informed of no face to face contact during delivery. CSW discussed transition option for food delivery as the Covid 19 Food relief program will be ending on December 12, 2018. Patient verbalizes understanding and grateful for the assistance.  CSW continues to follow for supportive needs.  Brenen Beigel H. Saralee Bolick, LCSW Clinical Social Worker [facility] Desk#: [phone] Cell#: [phone]   

## 2018-12-08 ENCOUNTER — Telehealth: Payer: Self-pay

## 2018-12-08 DIAGNOSIS — I10 Essential (primary) hypertension: Secondary | ICD-10-CM

## 2018-12-08 NOTE — Telephone Encounter (Signed)
This encounter was created in error - please disregard.

## 2018-12-08 NOTE — Telephone Encounter (Signed)
Spoke with pt he is aware of results. Pt verbalized understanding and is schedule to come in for labs on 12/17/2018

## 2018-12-08 NOTE — Telephone Encounter (Signed)
-----   Message from Daune Perch, NP sent at 12/08/2018  8:13 AM EDT ----- Serum creatinine is 1.7, BUN 34 and potassium 5.4.  Kidney function is worse and potassium a little high. Please stop spironolactone. Hold lasix for 1 day, then resume at 40 mg once daily. He will need follow up BMet in 1 week after changes. He should be able to come to the office now for labs. We will see how he is doing at the time of his follow up labs. If he is having more swelling with reduction in diuretics, we may need to see him in the office.   Daune Perch, NP

## 2018-12-11 ENCOUNTER — Telehealth: Payer: Self-pay | Admitting: Licensed Clinical Social Worker

## 2018-12-11 NOTE — Telephone Encounter (Signed)
CSW contacted patient to follow up on [employer] application. CSW informed patient and wife that application submitted and delivery date of meals. Patient and wife grateful for the opportunity to continue with the [employer] Cov[employer] Food program for ongoing heart healthy meals. Patient informed this program is temporary although hopeful to support patient through the summer. CSW will continue to monitor and be available as needed.  Raquel Sarna, [place], [place]

## 2018-12-17 ENCOUNTER — Other Ambulatory Visit: Payer: Self-pay

## 2018-12-17 ENCOUNTER — Other Ambulatory Visit: Payer: Medicare Other | Admitting: *Deleted

## 2018-12-17 DIAGNOSIS — I10 Essential (primary) hypertension: Secondary | ICD-10-CM

## 2018-12-17 LAB — BASIC METABOLIC PANEL
BUN/Creatinine Ratio: 23 (ref 10–24)
BUN: 40 mg/dL — ABNORMAL HIGH (ref 8–27)
CO2: 27 mmol/L (ref 20–29)
Calcium: 8.4 mg/dL — ABNORMAL LOW (ref 8.6–10.2)
Chloride: 94 mmol/L — ABNORMAL LOW (ref 96–106)
Creatinine, Ser: 1.73 mg/dL — ABNORMAL HIGH (ref 0.76–1.27)
GFR calc Af Amer: 46 mL/min/{1.73_m2} — ABNORMAL LOW (ref >59)
GFR calc non Af Amer: 39 mL/min/{1.73_m2} — ABNORMAL LOW (ref >59)
Glucose: 177 mg/dL — ABNORMAL HIGH (ref 65–99)
Potassium: 4.2 mmol/L (ref 3.5–5.2)
Sodium: 139 mmol/L (ref 134–144)

## 2018-12-19 ENCOUNTER — Telehealth: Payer: Self-pay | Admitting: Cardiology

## 2018-12-19 NOTE — Telephone Encounter (Signed)
I called and spoke to pt and his wife. He is doing well. Wt is down 2 lbs. He has "no swelling at all". He is getting more active without shortness of breath. His wife thinks that he has been doing great.  He may be getting over diuresed now leading to increased SCr. Will stop lasix for now. We reviewed to watch for increased wt, swelling or shortness of breath and can take as needed dose of lasix.  I think he should be seen in the office next week to fully evaluate him and review meds.   I will send to [facility] for scheduling.

## 2018-12-19 NOTE — Telephone Encounter (Addendum)
Pt is scheduled for a office visit with Kathyrn Drown, NP on 12/22/2018

## 2018-12-22 ENCOUNTER — Ambulatory Visit (INDEPENDENT_AMBULATORY_CARE_PROVIDER_SITE_OTHER): Payer: Medicare Other | Admitting: Cardiology

## 2018-12-22 ENCOUNTER — Encounter: Payer: Self-pay | Admitting: Cardiology

## 2018-12-22 ENCOUNTER — Other Ambulatory Visit: Payer: Self-pay

## 2018-12-22 VITALS — BP 130/76 | HR 83 | Ht 67.0 in | Wt 230.1 lb

## 2018-12-22 DIAGNOSIS — I10 Essential (primary) hypertension: Secondary | ICD-10-CM

## 2018-12-22 MED ORDER — FUROSEMIDE 40 MG PO TABS: 40.0000 mg | ORAL_TABLET | ORAL | 3 refills | Status: DC | PRN

## 2018-12-22 NOTE — Patient Instructions (Signed)
Medication Instructions:  CHANGE: Lasix to 40 mg as needed for increased wt gain (3lbs in one day or 5lbs in one week)  If you need a refill on your cardiac medications before your next appointment, please call your pharmacy.   Lab work: TODAY: BMET   If you have labs (blood work) drawn today and your tests are completely normal, you will receive your results only by: Marland Kitchen MyChart Message (if you have MyChart) OR . A paper copy in the mail If you have any lab test that is abnormal or we need to change your treatment, we will call you to review the results.  Testing/Procedures: None   Follow-Up: You are scheduled to see Pecolia Ades, NP on 01/21/2019 @ 11:15 AM  Any Other Special Instructions Will Be Listed Below (If Applicable).

## 2018-12-22 NOTE — Progress Notes (Signed)
Cardiology Office Note   Date:  12/22/2018   ID:  Harold ManisRodney Waller, DOB 02/14/1951, MRN [id number]  PCP:  [facility], [facility], MD (Inactive)  Cardiologist: Dr. Excell Seltzerooper, MD   Chief Complaint  Patient presents with  . Follow-up  . Abnormal Lab    History of Present Illness: Harold ManisRodney Kisling is a 68 y.o. male  With a hx of HTN, HL, DM, AAA, mild AI and mild non-obstructive CAD.He also probably has COPD, although he has never smoked but he has had prolonged exposure to second hand smoke. Pt was recently hospitalized in 10/2018 for pneumonia. Has been infor pneumonia4 times needing intubation. He had subsequent PEA cardiac arrest in the ambulancewith this last episode and was intubated. This was felt to be related hypoxia.Echo showed LVEF of 45-50% (similar to prior) and mild LVH.There is mild dilatation of the aortic root measuring 39mm and of the ascending aorta measuring 44 mm.He was diuresed. He had AKI and home ARB was held.    He was seen in hospital follow-up on 10/30/2018 at which time he with his breathing and LE edema.  His weight had initially decreased by 8-10lb in the post hospital setting, but was up 3lb on that particular visit.  He had been followed by home health nursing who reported that he looked great without medication changes.  He was then seen 1 month later on 11/19/2018 via virtual visit by Lizabeth LeydenNina Hammond, NP in which he reported that he was improving a lot.  He continued to sleep in a chair secondary to rib pain after CPR.  He had no orthopnea or CP. He reported mild LE swelling. He had chronic drainage from his right leg (follow[facility] Atil[facility]Betty [facility]) treated for venous stasis without compliance of LE elevation.  He was no longer on supplemental home oxygen.  Walking to the mailbox without a walker and doing well. [facility] 12[facility]. His weight had remained stable at around 230lb.   Plan was to redraw BMET given renal  dysfunction. Creatinine came back significantly elevated at 1.73 from 1.4. Lasix 40mg  QD was held and was to only be taken on an as needed basis. Weight remained stable. He is seen today to assess symptoms and repeat labs. He denies chest pain, SOB, PND, dizziness, or syncope. He reports that he feels great today. He brings his weight log and has remained stable with a baseline weight of 230lb. Since he was last seen he has taken one PRN dose of Lasix (yesterday) for a weight gain of 2lb. He does has some LE swelling in combination of his chronic venous stasis. Difficult to determine what is the cause of his LE swelling. Has a f/u OV with PCP next week for LE venous stasis. He states that right LE drainage has improved with less redness.    Past Medical History:  Diagnosis Date  . 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 Diabetes mellitus 10/17/2011   newly dx today  . Edema leg    right leg has leaky valve and right foot swells  . Emphysema   . Excessive ear wax   . Fatty liver   . Hernia    near navel  . Hyperlipidemia   . Hypertension   . Leaky heart valve   . Neuropathy   . Rheumatic fever   . Slow urinary stream   . TIA (transient  ischemic attack)    Past Surgical History:  Procedure Laterality Date  . CIRCUMCISION    . LITHOTRIPSY     Current Outpatient Medications  Medication Sig Dispense Refill  . albuterol (PROVENTIL HFA;VENTOLIN HFA) 108 (90 Base) MCG/ACT inhaler Inhale 2 puffs into the lungs every 6 (six) hours as needed for wheezing or shortness of breath. 1 Inhaler 2  . aspirin EC 81 MG tablet Take 81 mg by mouth daily.    Marland Kitchen. atorvastatin (LIPITOR) 40 MG tablet Take 1 tablet (40 mg total) by mouth every evening. 30 tablet 0  . carvedilol (COREG) 3.125 MG tablet Take 1 tablet (3.125 mg total) by mouth 2 (two) times daily with a meal. 180 tablet 3  . finasteride (PROSCAR) 5 MG tablet Take 5 mg by mouth daily.    . furosemide  (LASIX) 40 MG tablet Take 1 tablet (40 mg total) by mouth as needed for fluid or edema (Take 1 tablet as needed for increased wt gain (3lbs in one day or 5lbs in a week)). 30 tablet 3  . gabapentin (NEURONTIN) 300 MG capsule Take 300 mg by mouth 4 (four) times daily.     . hydrOXYzine (VISTARIL) 50 MG capsule Take 50 mg by mouth 3 (three) times daily.    . metFORMIN (GLUCOPHAGE) 1000 MG tablet Take 1 tablet by mouth 2 (two) times a day.    . montelukast (SINGULAIR) 10 MG tablet Take 10 mg by mouth at bedtime.    . Multiple Vitamin (MULTIVITAMIN) tablet Take 1 tablet by mouth daily.    . pantoprazole (PROTONIX) 40 MG tablet Take 1 tablet (40 mg total) by mouth daily. 30 tablet 1  . tamsulosin (FLOMAX) 0.4 MG CAPS capsule Take 0.4 mg by mouth daily.     No current facility-administered medications for this visit.     Allergies:   Cashew nut oil and Percocet [oxycodone-acetaminophen]    Social History:  The patient  reports that he has never smoked. He has never used smokeless tobacco. He reports that he does not drink alcohol or use drugs.  family history includes Aneurysm in his mother; Coronary artery disease in his brother, brother, and sister; Emphysema in his father and mother.    ROS:  Please see the history of present illness.   Otherwise, review of systems are positive for none.   All other systems are reviewed and negative.    PHYSICAL EXAM: VS:  BP 130/76   Pulse 83   Ht 5\' 7"  (1.702 m)   Wt 230 lb 1.9 oz (104.4 kg)   SpO2 93%   BMI 36.04 kg/m  , BMI Body mass index is 36.04 kg/m.   General: Well developed, well nourished, NAD Skin: BLE redness. No drainage  Neck: Negative for carotid bruits. No JVD Lungs:Clear to ausculation bilaterally. No wheezes, rales, or rhonchi. Breathing is unlabored. Cardiovascular: RRR with S1 S2. No murmurs, rubs, gallops, or LV heave appreciated. Abdomen: Soft, non-tender, distended. No obvious abdominal masses. Extremities: 1-2+ R>L edema.  No clubbing or cyanosis. DP/PT pulses 1+ bilaterally Neuro: Alert and oriented. No focal deficits. No facial asymmetry. MAE spontaneously. Psych: Responds to questions appropriately with normal affect.    EKG:  EKG is not ordered today.  Recent Labs: 10/11/2018: ALT 35; B Natriuretic Peptide 342.4 10/12/2018: Magnesium 1.9 10/17/2018: Hemoglobin 10.3; Platelets 284 12/17/2018: BUN 40; Creatinine, Ser 1.73; Potassium 4.2; Sodium 139   Lipid Panel    Component Value Date/Time   CHOL 165 10/16/2018 0452  TRIG 204 (H) 10/16/2018 0452   HDL 33 (L) 10/16/2018 0452   CHOLHDL 5.0 10/16/2018 0452   VLDL 41 (H) 10/16/2018 0452   LDLCALC 91 10/16/2018 0452     Wt Readings from Last 3 Encounters:  12/22/18 230 lb 1.9 oz (104.4 kg)  11/19/18 230 lb (104.3 kg)  10/30/18 232 lb (105.2 kg)    Other studies Reviewed: Additional studies/ records that were reviewed today include:  Echocardiogram 10/16/18 1. The left ventricle has mildly reduced systolic function, with an ejection fraction of 45-50%. The cavity size was normal. There is mild concentric left ventricular hypertrophy. Left ventricular diastolic Doppler parameters are consistent with  impaired relaxation. There is abnormal septal motion consistent with left bundle branch block. Left ventricular diffuse hypokinesis. 2. The right ventricle has normal systolc function. The cavity was normal. There is no increase in right ventricular wall thickness. 3. There is severe mitral annular calcification present. 4. The aortic valve was not well visualized Moderate thickening of the aortic valve Moderate calcification of the aortic valve. Aortic valve regurgitation is mild by color flow Doppler. 5. There is mild dilatation of the aortic root measuring 11mm and of the ascending aorta measuring 44 mm.  FINDINGS Left Ventricle: The left ventricle has mildly reduced systolic function, with an ejection fraction of 45-50%. The cavity size was  normal. There is mild concentric left ventricular hypertrophy. Left ventricular diastolic Doppler parameters are consistent with impaired relaxation (grade I). Normal left ventricular filling pressures There is abnormal (paradoxical) septal motion, consistent with left bundle branch block. Left ventricular diffuse hypokinesis.  ASSESSMENT AND PLAN:  1. Chronic systolic heart failure: -LVEF 45 to 50% per echocardiogram while hospitalized for pneumonia, PEA arrest 10/2018 -Continue carvedilol 3.125 mg twice daily, spironolactone 25 mg daily. Not on ARB secondary to renal dysfunction -Lasix 40 mg QD currently on an as-needed basis secondary to renal dysfunction -Creatinine, 1.73 on 12/17/2018 -Repeat labs today and will continue Lasix 40mg  PRN for weight gain greater than 3lb in one day or 5lb in one week. If lab work stable, may consider adding 20mg  QD back to regimen  -Baseline weight 230lb>>in the last week weight has ranged from 228-232lb -Feels great   2. Hyperlipidemia: -Last LDL, 91  -Continue atorvastatin 40 mg daily.  3. DM type 2: -Hgb A1c 10.6 09/2017 with improvement to 8.0 on 10/16/18 -To follow with PCP next week   4. AKI: -Creatinine, 1.47 at hospital follow-up 11/03/18 through [facility]>>repeat drawn by [facility], now at 1.73 -Lasix 40 mg twice daily on an as-needed basis, held secondary to worsening creatinine  -ARB currently on hold -Repeat BMET today and plan as above    5.  Chronic venous insufficiency: -Pt encouraged to elevate LE while sitting -LE do not appear to be infected, plan to follow with PCP next week  -Encouraged ACE wraps during daylight hours and elevate while sitting -If improvement in renal function, will likely add Lasix 20mg  QD to daily regimen    Current medicines are reviewed at length with the patient today.  The patient does not have concerns regarding medicines.  The following changes have been made:  no change  Labs/ tests ordered today  include:  Orders Placed This Encounter  Procedures  . Basic metabolic panel    Disposition:   FU with Pecolia Ades, NP or Kathyrn Drown, NP in 1 month   Signed, Kathyrn Drown, NP  12/22/2018 11:51 AM    [facility] [address]  [address], [place], [place]  [address] Phone: [phone]; Fax: [phone]

## 2018-12-23 ENCOUNTER — Telehealth: Payer: Self-pay | Admitting: Licensed Clinical Social Worker

## 2018-12-23 LAB — BASIC METABOLIC PANEL
BUN/Creatinine Ratio: 17 (ref 10–24)
BUN: 28 mg/dL — ABNORMAL HIGH (ref 8–27)
CO2: 24 mmol/L (ref 20–29)
Calcium: 9 mg/dL (ref 8.6–10.2)
Chloride: 100 mmol/L (ref 96–106)
Creatinine, Ser: 1.66 mg/dL — ABNORMAL HIGH (ref 0.76–1.27)
GFR calc Af Amer: 48 mL/min/{1.73_m2} — ABNORMAL LOW (ref >59)
GFR calc non Af Amer: 41 mL/min/{1.73_m2} — ABNORMAL LOW (ref >59)
Glucose: 164 mg/dL — ABNORMAL HIGH (ref 65–99)
Potassium: 5 mmol/L (ref 3.5–5.2)
Sodium: 140 mmol/L (ref 134–144)

## 2018-12-23 NOTE — Telephone Encounter (Signed)
CSW contacted patient to follow up on [employer] delivery and discuss program benefits with patient. Patient stated meal delivery received and enjoying the heart healthy meals. Patient verbalizes understanding of follow up for the upcoming food delivery process. Patient grateful for the opportunity with the [employer] program for heart healthy meals. Patient informed this program is temporary although hopeful to support patient through the summer. CSW will continue to monitor and be available as needed.  Jackie Mete Purdum, LCSW, CCSW-MCS [phone] 

## 2018-12-25 ENCOUNTER — Encounter (HOSPITAL_COMMUNITY): Payer: Self-pay

## 2018-12-25 ENCOUNTER — Ambulatory Visit: Payer: Medicare Other | Admitting: Physician Assistant

## 2018-12-25 ENCOUNTER — Inpatient Hospital Stay (HOSPITAL_COMMUNITY): Payer: Medicare Other

## 2018-12-25 ENCOUNTER — Inpatient Hospital Stay (HOSPITAL_COMMUNITY)
Admission: EM | Admit: 2018-12-25 | Discharge: 2018-12-29 | DRG: 189 | Disposition: A | Discharge status: Home Health | Payer: Medicare Other | Attending: Internal Medicine | Admitting: Internal Medicine

## 2018-12-25 ENCOUNTER — Emergency Department (HOSPITAL_COMMUNITY): Payer: Medicare Other

## 2018-12-25 ENCOUNTER — Other Ambulatory Visit: Payer: Self-pay

## 2018-12-25 DIAGNOSIS — J181 Lobar pneumonia, unspecified organism: Secondary | ICD-10-CM | POA: Diagnosis present

## 2018-12-25 DIAGNOSIS — R402252 Coma scale, best verbal response, oriented, at arrival to emergency department: Secondary | ICD-10-CM | POA: Diagnosis present

## 2018-12-25 DIAGNOSIS — Z20828 Contact with and (suspected) exposure to other viral communicable diseases: Secondary | ICD-10-CM | POA: Diagnosis present

## 2018-12-25 DIAGNOSIS — I5043 Acute on chronic combined systolic (congestive) and diastolic (congestive) heart failure: Secondary | ICD-10-CM | POA: Diagnosis present

## 2018-12-25 DIAGNOSIS — R402362 Coma scale, best motor response, obeys commands, at arrival to emergency department: Secondary | ICD-10-CM | POA: Diagnosis present

## 2018-12-25 DIAGNOSIS — I712 Thoracic aortic aneurysm, without rupture, unspecified: Secondary | ICD-10-CM | POA: Diagnosis present

## 2018-12-25 DIAGNOSIS — Z8673 Personal history of transient ischemic attack (TIA), and cerebral infarction without residual deficits: Secondary | ICD-10-CM

## 2018-12-25 DIAGNOSIS — I447 Left bundle-branch block, unspecified: Secondary | ICD-10-CM | POA: Diagnosis present

## 2018-12-25 DIAGNOSIS — R55 Syncope and collapse: Secondary | ICD-10-CM | POA: Diagnosis present

## 2018-12-25 DIAGNOSIS — I130 Hypertensive heart and chronic kidney disease with heart failure and stage 1 through stage 4 chronic kidney disease, or unspecified chronic kidney disease: Secondary | ICD-10-CM | POA: Diagnosis not present

## 2018-12-25 DIAGNOSIS — J9601 Acute respiratory failure with hypoxia: Principal | ICD-10-CM | POA: Diagnosis present

## 2018-12-25 DIAGNOSIS — E1165 Type 2 diabetes mellitus with hyperglycemia: Secondary | ICD-10-CM | POA: Diagnosis present

## 2018-12-25 DIAGNOSIS — Z7982 Long term (current) use of aspirin: Secondary | ICD-10-CM

## 2018-12-25 DIAGNOSIS — R402142 Coma scale, eyes open, spontaneous, at arrival to emergency department: Secondary | ICD-10-CM | POA: Diagnosis present

## 2018-12-25 DIAGNOSIS — K219 Gastro-esophageal reflux disease without esophagitis: Secondary | ICD-10-CM | POA: Diagnosis present

## 2018-12-25 DIAGNOSIS — E1140 Type 2 diabetes mellitus with diabetic neuropathy, unspecified: Secondary | ICD-10-CM | POA: Diagnosis present

## 2018-12-25 DIAGNOSIS — Z79899 Other long term (current) drug therapy: Secondary | ICD-10-CM

## 2018-12-25 DIAGNOSIS — I2510 Atherosclerotic heart disease of native coronary artery without angina pectoris: Secondary | ICD-10-CM | POA: Diagnosis present

## 2018-12-25 DIAGNOSIS — Y95 Nosocomial condition: Secondary | ICD-10-CM | POA: Diagnosis present

## 2018-12-25 DIAGNOSIS — N183 Chronic kidney disease, stage 3 (moderate): Secondary | ICD-10-CM | POA: Diagnosis present

## 2018-12-25 DIAGNOSIS — D638 Anemia in other chronic diseases classified elsewhere: Secondary | ICD-10-CM | POA: Diagnosis present

## 2018-12-25 DIAGNOSIS — I160 Hypertensive urgency: Secondary | ICD-10-CM | POA: Diagnosis present

## 2018-12-25 DIAGNOSIS — I878 Other specified disorders of veins: Secondary | ICD-10-CM | POA: Diagnosis present

## 2018-12-25 DIAGNOSIS — N401 Enlarged prostate with lower urinary tract symptoms: Secondary | ICD-10-CM | POA: Diagnosis present

## 2018-12-25 DIAGNOSIS — E785 Hyperlipidemia, unspecified: Secondary | ICD-10-CM | POA: Diagnosis present

## 2018-12-25 DIAGNOSIS — Z8674 Personal history of sudden cardiac arrest: Secondary | ICD-10-CM

## 2018-12-25 DIAGNOSIS — I890 Lymphedema, not elsewhere classified: Secondary | ICD-10-CM | POA: Diagnosis present

## 2018-12-25 DIAGNOSIS — Z825 Family history of asthma and other chronic lower respiratory diseases: Secondary | ICD-10-CM

## 2018-12-25 DIAGNOSIS — E1122 Type 2 diabetes mellitus with diabetic chronic kidney disease: Secondary | ICD-10-CM | POA: Diagnosis present

## 2018-12-25 DIAGNOSIS — J9600 Acute respiratory failure, unspecified whether with hypoxia or hypercapnia: Secondary | ICD-10-CM | POA: Diagnosis present

## 2018-12-25 DIAGNOSIS — R338 Other retention of urine: Secondary | ICD-10-CM | POA: Diagnosis present

## 2018-12-25 DIAGNOSIS — J9602 Acute respiratory failure with hypercapnia: Secondary | ICD-10-CM | POA: Diagnosis present

## 2018-12-25 DIAGNOSIS — I10 Essential (primary) hypertension: Secondary | ICD-10-CM | POA: Diagnosis present

## 2018-12-25 DIAGNOSIS — E119 Type 2 diabetes mellitus without complications: Secondary | ICD-10-CM

## 2018-12-25 DIAGNOSIS — Z8249 Family history of ischemic heart disease and other diseases of the circulatory system: Secondary | ICD-10-CM

## 2018-12-25 DIAGNOSIS — Z7984 Long term (current) use of oral hypoglycemic drugs: Secondary | ICD-10-CM

## 2018-12-25 DIAGNOSIS — J439 Emphysema, unspecified: Secondary | ICD-10-CM | POA: Diagnosis present

## 2018-12-25 LAB — COMPREHENSIVE METABOLIC PANEL
ALT: 28 U/L (ref 0–44)
ALT: 31 U/L (ref 0–44)
AST: 23 U/L (ref 15–41)
AST: 20 U/L (ref 15–41)
Albumin: 3.3 g/dL — ABNORMAL LOW (ref 3.5–5.0)
Albumin: 2.9 g/dL — ABNORMAL LOW (ref 3.5–5.0)
Alkaline Phosphatase: 137 U/L — ABNORMAL HIGH (ref 38–126)
Alkaline Phosphatase: 117 U/L (ref 38–126)
Anion gap: 13 (ref 5–15)
Anion gap: 10 (ref 5–15)
BUN: 20 mg/dL (ref 8–23)
BUN: 21 mg/dL (ref 8–23)
CO2: 19 mmol/L — ABNORMAL LOW (ref 22–32)
CO2: 27 mmol/L (ref 22–32)
Calcium: 8.6 mg/dL — ABNORMAL LOW (ref 8.9–10.3)
Calcium: 8.5 mg/dL — ABNORMAL LOW (ref 8.9–10.3)
Chloride: 103 mmol/L (ref 98–111)
Chloride: 102 mmol/L (ref 98–111)
Creatinine, Ser: 1.6 mg/dL — ABNORMAL HIGH (ref 0.61–1.24)
Creatinine, Ser: 1.48 mg/dL — ABNORMAL HIGH (ref 0.61–1.24)
GFR calc Af Amer: 50 mL/min — ABNORMAL LOW (ref >60)
GFR calc Af Amer: 55 mL/min — ABNORMAL LOW (ref >60)
GFR calc non Af Amer: 43 mL/min — ABNORMAL LOW (ref >60)
GFR calc non Af Amer: 48 mL/min — ABNORMAL LOW (ref >60)
Glucose, Bld: 311 mg/dL — ABNORMAL HIGH (ref 70–99)
Glucose, Bld: 228 mg/dL — ABNORMAL HIGH (ref 70–99)
Potassium: 5 mmol/L (ref 3.5–5.1)
Potassium: 4.7 mmol/L (ref 3.5–5.1)
Sodium: 135 mmol/L (ref 135–145)
Sodium: 139 mmol/L (ref 135–145)
Total Bilirubin: 0.5 mg/dL (ref 0.3–1.2)
Total Bilirubin: 0.9 mg/dL (ref 0.3–1.2)
Total Protein: 7.9 g/dL (ref 6.5–8.1)
Total Protein: 7.1 g/dL (ref 6.5–8.1)

## 2018-12-25 LAB — GLUCOSE, CAPILLARY
Glucose-Capillary: 225 mg/dL — ABNORMAL HIGH (ref 70–99)
Glucose-Capillary: 246 mg/dL — ABNORMAL HIGH (ref 70–99)
Glucose-Capillary: 224 mg/dL — ABNORMAL HIGH (ref 70–99)

## 2018-12-25 LAB — CBC WITH DIFFERENTIAL/PLATELET
Abs Immature Granulocytes: 0.05 10*3/uL (ref 0.00–0.07)
Abs Immature Granulocytes: 0.04 10*3/uL (ref 0.00–0.07)
Basophils Absolute: 0.1 10*3/uL (ref 0.0–0.1)
Basophils Absolute: 0.1 10*3/uL (ref 0.0–0.1)
Basophils Relative: 1 %
Basophils Relative: 1 %
Eosinophils Absolute: 0.3 10*3/uL (ref 0.0–0.5)
Eosinophils Absolute: 0.1 10*3/uL (ref 0.0–0.5)
Eosinophils Relative: 3 %
Eosinophils Relative: 1 %
HCT: 40.1 % (ref 39.0–52.0)
HCT: 37.5 % — ABNORMAL LOW (ref 39.0–52.0)
Hemoglobin: 12.3 g/dL — ABNORMAL LOW (ref 13.0–17.0)
Hemoglobin: 11.6 g/dL — ABNORMAL LOW (ref 13.0–17.0)
Immature Granulocytes: 1 %
Immature Granulocytes: 0 %
Lymphocytes Relative: 27 %
Lymphocytes Relative: 5 %
Lymphs Abs: 2.8 10*3/uL (ref 0.7–4.0)
Lymphs Abs: 0.7 10*3/uL (ref 0.7–4.0)
MCH: 27.9 pg (ref 26.0–34.0)
MCH: 28 pg (ref 26.0–34.0)
MCHC: 30.7 g/dL (ref 30.0–36.0)
MCHC: 30.9 g/dL (ref 30.0–36.0)
MCV: 90.9 fL (ref 80.0–100.0)
MCV: 90.6 fL (ref 80.0–100.0)
Monocytes Absolute: 0.6 10*3/uL (ref 0.1–1.0)
Monocytes Absolute: 0.5 10*3/uL (ref 0.1–1.0)
Monocytes Relative: 6 %
Monocytes Relative: 4 %
Neutro Abs: 6.6 10*3/uL (ref 1.7–7.7)
Neutro Abs: 12.9 10*3/uL — ABNORMAL HIGH (ref 1.7–7.7)
Neutrophils Relative %: 62 %
Neutrophils Relative %: 89 %
Platelets: 353 10*3/uL (ref 150–400)
Platelets: 268 10*3/uL (ref 150–400)
RBC: 4.41 MIL/uL (ref 4.22–5.81)
RBC: 4.14 MIL/uL — ABNORMAL LOW (ref 4.22–5.81)
RDW: 13.9 % (ref 11.5–15.5)
RDW: 14 % (ref 11.5–15.5)
WBC: 10.5 10*3/uL (ref 4.0–10.5)
WBC: 14.3 10*3/uL — ABNORMAL HIGH (ref 4.0–10.5)
nRBC: 0 % (ref 0.0–0.2)
nRBC: 0 % (ref 0.0–0.2)

## 2018-12-25 LAB — LACTIC ACID, PLASMA
Lactic Acid, Venous: 2.9 mmol/L (ref 0.5–1.9)
Lactic Acid, Venous: 1.9 mmol/L (ref 0.5–1.9)
Lactic Acid, Venous: 2.2 mmol/L (ref 0.5–1.9)

## 2018-12-25 LAB — POCT I-STAT 7, (LYTES, BLD GAS, ICA,H+H)
Acid-base deficit: 4 mmol/L — ABNORMAL HIGH (ref 0.0–2.0)
Bicarbonate: 22.5 mmol/L (ref 20.0–28.0)
Calcium, Ion: 1.15 mmol/L (ref 1.15–1.40)
HCT: 39 % (ref 39.0–52.0)
Hemoglobin: 13.3 g/dL (ref 13.0–17.0)
O2 Saturation: 92 %
Patient temperature: 98.6
Potassium: 4.6 mmol/L (ref 3.5–5.1)
Sodium: 137 mmol/L (ref 135–145)
TCO2: 24 mmol/L (ref 22–32)
pCO2 arterial: 47.6 mmHg (ref 32.0–48.0)
pH, Arterial: 7.282 — ABNORMAL LOW (ref 7.350–7.450)
pO2, Arterial: 72 mmHg — ABNORMAL LOW (ref 83.0–108.0)

## 2018-12-25 LAB — TROPONIN I (HIGH SENSITIVITY)
Troponin I (High Sensitivity): 2 ng/L (ref <18)
Troponin I (High Sensitivity): 4 ng/L (ref <18)
Troponin I (High Sensitivity): 12 ng/L (ref <18)
Troponin I (High Sensitivity): 11 ng/L (ref <18)

## 2018-12-25 LAB — CBG MONITORING, ED
Glucose-Capillary: 205 mg/dL — ABNORMAL HIGH (ref 70–99)
Glucose-Capillary: 195 mg/dL — ABNORMAL HIGH (ref 70–99)

## 2018-12-25 LAB — BRAIN NATRIURETIC PEPTIDE: B Natriuretic Peptide: 172.1 pg/mL — ABNORMAL HIGH (ref 0.0–100.0)

## 2018-12-25 LAB — MAGNESIUM: Magnesium: 1.5 mg/dL — ABNORMAL LOW (ref 1.7–2.4)

## 2018-12-25 LAB — HIV ANTIBODY (ROUTINE TESTING W REFLEX): HIV Screen 4th Generation wRfx: NONREACTIVE

## 2018-12-25 LAB — C-REACTIVE PROTEIN: CRP: <0.8 mg/dL (ref <1.0)

## 2018-12-25 LAB — PROCALCITONIN: Procalcitonin: 0.12 ng/mL

## 2018-12-25 LAB — SARS CORONAVIRUS 2 BY RT PCR (HOSPITAL ORDER, PERFORMED IN ~~LOC~~ HOSPITAL LAB): SARS Coronavirus 2: NEGATIVE

## 2018-12-25 LAB — TSH: TSH: 2.029 u[IU]/mL (ref 0.350–4.500)

## 2018-12-25 MED ORDER — ADULT MULTIVITAMIN W/MINERALS CH
1.0000 | ORAL_TABLET | Freq: Every day | ORAL | Status: DC
  Administered 2018-12-25 – 2018-12-29 (×5): 1 via ORAL
  Filled 2018-12-25 (×5): qty 1

## 2018-12-25 MED ORDER — ASPIRIN 81 MG PO CHEW
324.0000 mg | CHEWABLE_TABLET | Freq: Once | ORAL | Status: AC
  Administered 2018-12-25: 02:00:00 324 mg via ORAL

## 2018-12-25 MED ORDER — ASPIRIN 81 MG PO CHEW
CHEWABLE_TABLET | ORAL | Status: AC
  Filled 2018-12-25: qty 4

## 2018-12-25 MED ORDER — FUROSEMIDE 10 MG/ML IJ SOLN
80.0000 mg | Freq: Once | INTRAMUSCULAR | Status: AC
  Administered 2018-12-25: 02:00:00 80 mg via INTRAVENOUS

## 2018-12-25 MED ORDER — ENOXAPARIN SODIUM 40 MG/0.4ML ~~LOC~~ SOLN
40.0000 mg | SUBCUTANEOUS | Status: DC
  Administered 2018-12-25 – 2018-12-28 (×4): 40 mg via SUBCUTANEOUS
  Filled 2018-12-25 (×4): qty 0.4

## 2018-12-25 MED ORDER — PIPERACILLIN-TAZOBACTAM 3.375 G IVPB 30 MIN
3.3750 g | Freq: Once | INTRAVENOUS | Status: AC
  Administered 2018-12-25: 3.375 g via INTRAVENOUS
  Filled 2018-12-25: qty 50

## 2018-12-25 MED ORDER — VANCOMYCIN HCL 10 G IV SOLR
2000.0000 mg | Freq: Once | INTRAVENOUS | Status: AC
  Administered 2018-12-25: 2000 mg via INTRAVENOUS
  Filled 2018-12-25: qty 2000

## 2018-12-25 MED ORDER — MAGNESIUM SULFATE 4 GM/100ML IV SOLN
4.0000 g | Freq: Once | INTRAVENOUS | Status: AC
  Administered 2018-12-25: 09:00:00 4 g via INTRAVENOUS
  Filled 2018-12-25: qty 100

## 2018-12-25 MED ORDER — ONDANSETRON HCL 4 MG/2ML IJ SOLN
4.0000 mg | Freq: Once | INTRAMUSCULAR | Status: DC
  Filled 2018-12-25: qty 2

## 2018-12-25 MED ORDER — ATORVASTATIN CALCIUM 40 MG PO TABS
40.0000 mg | ORAL_TABLET | Freq: Every evening | ORAL | Status: DC
  Administered 2018-12-25 – 2018-12-28 (×4): 40 mg via ORAL
  Filled 2018-12-25 (×4): qty 1

## 2018-12-25 MED ORDER — CARVEDILOL 3.125 MG PO TABS
3.1250 mg | ORAL_TABLET | Freq: Two times a day (BID) | ORAL | Status: DC
  Administered 2018-12-25 – 2018-12-29 (×9): 3.125 mg via ORAL
  Filled 2018-12-25 (×9): qty 1

## 2018-12-25 MED ORDER — NITROGLYCERIN IN D5W 200-5 MCG/ML-% IV SOLN
INTRAVENOUS | Status: AC
  Filled 2018-12-25: qty 250

## 2018-12-25 MED ORDER — MONTELUKAST SODIUM 10 MG PO TABS
10.0000 mg | ORAL_TABLET | Freq: Every day | ORAL | Status: DC
  Administered 2018-12-25 – 2018-12-28 (×4): 10 mg via ORAL
  Filled 2018-12-25 (×4): qty 1

## 2018-12-25 MED ORDER — ACETAMINOPHEN 650 MG RE SUPP: 650.0000 mg | Freq: Four times a day (QID) | RECTAL | Status: DC | PRN

## 2018-12-25 MED ORDER — PANTOPRAZOLE SODIUM 40 MG PO TBEC
40.0000 mg | DELAYED_RELEASE_TABLET | Freq: Every day | ORAL | Status: DC
  Administered 2018-12-25 – 2018-12-29 (×5): 40 mg via ORAL
  Filled 2018-12-25 (×5): qty 1

## 2018-12-25 MED ORDER — MORPHINE SULFATE (PF) 2 MG/ML IV SOLN
INTRAVENOUS | Status: AC
  Filled 2018-12-25: qty 1

## 2018-12-25 MED ORDER — INSULIN ASPART 100 UNIT/ML ~~LOC~~ SOLN
0.0000 [IU] | Freq: Three times a day (TID) | SUBCUTANEOUS | Status: DC
  Administered 2018-12-25: 18:00:00 5 [IU] via SUBCUTANEOUS
  Administered 2018-12-25: 8 [IU] via SUBCUTANEOUS
  Administered 2018-12-26: 5 [IU] via SUBCUTANEOUS
  Administered 2018-12-26: 12:00:00 3 [IU] via SUBCUTANEOUS
  Administered 2018-12-26: 16:00:00 11 [IU] via SUBCUTANEOUS
  Administered 2018-12-27: 08:00:00 3 [IU] via SUBCUTANEOUS
  Administered 2018-12-27: 15 [IU] via SUBCUTANEOUS
  Administered 2018-12-27: 5 [IU] via SUBCUTANEOUS
  Administered 2018-12-28 (×2): 3 [IU] via SUBCUTANEOUS
  Administered 2018-12-28: 12:00:00 15 [IU] via SUBCUTANEOUS
  Administered 2018-12-29: 12:00:00 11 [IU] via SUBCUTANEOUS
  Administered 2018-12-29: 5 [IU] via SUBCUTANEOUS

## 2018-12-25 MED ORDER — INSULIN ASPART 100 UNIT/ML ~~LOC~~ SOLN: 0.0000 [IU] | Freq: Three times a day (TID) | SUBCUTANEOUS | Status: DC

## 2018-12-25 MED ORDER — FINASTERIDE 5 MG PO TABS
5.0000 mg | ORAL_TABLET | Freq: Every day | ORAL | Status: DC
  Administered 2018-12-25 – 2018-12-29 (×5): 5 mg via ORAL
  Filled 2018-12-25 (×5): qty 1

## 2018-12-25 MED ORDER — ACETAMINOPHEN 325 MG PO TABS
650.0000 mg | ORAL_TABLET | Freq: Four times a day (QID) | ORAL | Status: DC | PRN
  Administered 2018-12-26 – 2018-12-28 (×2): 650 mg via ORAL
  Filled 2018-12-25 (×2): qty 2

## 2018-12-25 MED ORDER — GABAPENTIN 300 MG PO CAPS
300.0000 mg | ORAL_CAPSULE | Freq: Four times a day (QID) | ORAL | Status: DC
  Administered 2018-12-25 – 2018-12-29 (×16): 300 mg via ORAL
  Filled 2018-12-25 (×16): qty 1

## 2018-12-25 MED ORDER — VANCOMYCIN HCL 10 G IV SOLR
1500.0000 mg | INTRAVENOUS | Status: DC
  Administered 2018-12-26: 04:00:00 1500 mg via INTRAVENOUS
  Filled 2018-12-25: qty 1500

## 2018-12-25 MED ORDER — LEVALBUTEROL HCL 0.63 MG/3ML IN NEBU
0.6300 mg | INHALATION_SOLUTION | Freq: Four times a day (QID) | RESPIRATORY_TRACT | Status: DC
  Administered 2018-12-25 – 2018-12-26 (×5): 0.63 mg via RESPIRATORY_TRACT
  Filled 2018-12-25 (×5): qty 3

## 2018-12-25 MED ORDER — FUROSEMIDE 10 MG/ML IJ SOLN
INTRAMUSCULAR | Status: AC
  Filled 2018-12-25: qty 8

## 2018-12-25 MED ORDER — TAMSULOSIN HCL 0.4 MG PO CAPS
0.4000 mg | ORAL_CAPSULE | Freq: Every day | ORAL | Status: DC
  Administered 2018-12-25 – 2018-12-29 (×5): 0.4 mg via ORAL
  Filled 2018-12-25 (×5): qty 1

## 2018-12-25 MED ORDER — MORPHINE SULFATE (PF) 2 MG/ML IV SOLN
2.0000 mg | Freq: Once | INTRAVENOUS | Status: AC
  Administered 2018-12-25: 02:00:00 2 mg via INTRAVENOUS

## 2018-12-25 MED ORDER — FUROSEMIDE 10 MG/ML IJ SOLN
40.0000 mg | Freq: Two times a day (BID) | INTRAMUSCULAR | Status: DC
  Administered 2018-12-25 – 2018-12-27 (×5): 40 mg via INTRAVENOUS
  Filled 2018-12-25 (×5): qty 4

## 2018-12-25 MED ORDER — FUROSEMIDE 10 MG/ML IJ SOLN: 60.0000 mg | Freq: Two times a day (BID) | INTRAMUSCULAR | Status: DC

## 2018-12-25 MED ORDER — NITROGLYCERIN IN D5W 200-5 MCG/ML-% IV SOLN
0.0000 ug/min | INTRAVENOUS | Status: DC
  Administered 2018-12-25: 02:00:00 10 ug/min via INTRAVENOUS

## 2018-12-25 MED ORDER — PIPERACILLIN-TAZOBACTAM 3.375 G IVPB
3.3750 g | Freq: Three times a day (TID) | INTRAVENOUS | Status: DC
  Administered 2018-12-25 – 2018-12-28 (×10): 3.375 g via INTRAVENOUS
  Filled 2018-12-25 (×10): qty 50

## 2018-12-25 MED ORDER — ASPIRIN EC 81 MG PO TBEC
81.0000 mg | DELAYED_RELEASE_TABLET | Freq: Every day | ORAL | Status: DC
  Administered 2018-12-25 – 2018-12-29 (×5): 81 mg via ORAL
  Filled 2018-12-25 (×5): qty 1

## 2018-12-25 NOTE — ED Notes (Signed)
Bi pap off. o2 at 5 liters

## 2018-12-25 NOTE — H&P (Addendum)
History and Physical    [name] YNW:[id number] DOB: 06/02/1951 DOA: 12/25/2018  PCP: [facility], MD (Inactive)  Patient coming from: Home.  History obtained from patient, patient's wife, ER physician.  Chief Complaint: Shortness of breath.  HPI: [name] is a 68 y.o. male with history of chronic systolic and diastolic CHF last EF measured in April 2020 was 45 to 50% at the time patient was admitted in April for shortness of breath was found to have pneumonia also had a PEA arrest has been recently taken off Lasix and was placed on PRN Lasix by cardiologist for CHF presents to the ER the patient became suddenly short of breath.  As per patient wife patient went to bed last night and shortly 2 hours later patient started complaining of short of breath.  Patient was placed on oxygen despite which patient became more short of breath and started wheezing.  And he passed out.  EMS was called patient was found to be having sats in the 30%.  Patient was placed on oxygen and brought to the ER.  Patient states he was not feeling well last evening and had some burning sensation of the chest and feeling very weak.  Did not have any headache or visual symptoms or any focal deficits.  Denies any nausea vomiting abdominal pain or diarrhea.  ED Course: In the ER patient was alert awake toxic had to be placed on BiPAP.  Was tachycardic.  Not febrile.  Patient states after coming to the ER he started coughing up.  COVID-19 test was negative.  Creatinine of 1.6 appears to be at baseline.  BNP 172 high sensitivity troponin was 2.  EKG shows sinus tachycardia with LBBB.  ABG showed pH of 7.28 PCO2 47.6 PO2 32.  WBC count was 10.5 hemoglobin 12.3 platelets 353.  Chest x-ray showed large area of consolidation in the right side concerning for pneumonia.  Also showed bilateral congestion.  Patient was given Lasix 80 mg IV empiric antibiotic started for possible pneumonia and was on BiPAP.  Review of Systems:  As per HPI, rest all negative.   Past Medical History:  Diagnosis Date  . 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 Diabetes mellitus 10/17/2011   newly dx today  . Edema leg    right leg has leaky valve and right foot swells  . Emphysema   . Excessive ear wax   . Fatty liver   . Hernia    near navel  . Hyperlipidemia   . Hypertension   . Leaky heart valve   . Neuropathy   . Rheumatic fever   . Slow urinary stream   . TIA (transient ischemic attack)     Past Surgical History:  Procedure Laterality Date  . CIRCUMCISION    . LITHOTRIPSY       reports that he has never smoked. He has never used smokeless tobacco. He reports that he does not drink alcohol or use drugs.  Allergies  Allergen Reactions  . Cashew Nut Oil Palpitations  . Percocet [Oxycodone-Acetaminophen] Other (See Comments)    Hallucintaions    Family History  Problem Relation Age of Onset  . Emphysema Mother   . Aneurysm Mother   . Emphysema Father   . Coronary artery disease Brother   . Coronary artery disease Brother   . Coronary artery disease Sister     Prior to Admission medications   Medication Sig Start Date  End Date Taking? Authorizing Provider  albuterol (PROVENTIL HFA;VENTOLIN HFA) 108 (90 Base) MCG/ACT inhaler Inhale 2 puffs into the lungs every 6 (six) hours as needed for wheezing or shortness of breath. 10/18/17   Erick Blinks, MD  aspirin EC 81 MG tablet Take 81 mg by mouth daily.    [provider]  atorvastatin (LIPITOR) 40 MG tablet Take 1 tablet (40 mg total) by mouth every evening. 06/08/18   Noralee Stain, DO  carvedilol (COREG) 3.125 MG tablet Take 1 tablet (3.125 mg total) by mouth 2 (two) times daily with a meal. 11/19/18   Berton Bon, NP  finasteride (PROSCAR) 5 MG tablet Take 5 mg by mouth daily.    [provider]  furosemide (LASIX) 40 MG tablet Take 1 tablet (40 mg total) by mouth as needed for fluid or edema  (Take 1 tablet as needed for increased wt gain (3lbs in one day or 5lbs in a week)). 12/22/18   Georgie Chard D, NP  gabapentin (NEURONTIN) 300 MG capsule Take 300 mg by mouth 4 (four) times daily.     [provider]  hydrOXYzine (VISTARIL) 50 MG capsule Take 50 mg by mouth 3 (three) times daily.    [provider]  metFORMIN (GLUCOPHAGE) 1000 MG tablet Take 1 tablet by mouth 2 (two) times a day. 11/17/18   [provider]  montelukast (SINGULAIR) 10 MG tablet Take 10 mg by mouth at bedtime.    [provider]  Multiple Vitamin (MULTIVITAMIN) tablet Take 1 tablet by mouth daily.    [provider]  pantoprazole (PROTONIX) 40 MG tablet Take 1 tablet (40 mg total) by mouth daily. 10/11/13   Philip Aspen, Limmie Patricia, MD  tamsulosin (FLOMAX) 0.4 MG CAPS capsule Take 0.4 mg by mouth daily. 05/03/15   [provider]    Physical Exam: Vitals:   12/25/18 0423 12/25/18 0430 12/25/18 0445 12/25/18 0612  BP: 111/82 115/74 111/79   Pulse: 99 100 98   Resp: SpO2: 99% 97% 99% 91%      Constitutional: Moderately built and nourished. Vitals:   12/25/18 0423 12/25/18 0430 12/25/18 0445 12/25/18 0612  BP: 111/82 115/74 111/79   Pulse: 99 100 98   Resp: SpO2: 99% 97% 99% 91%   Eyes: Anicteric no pallor. ENMT: No discharge from the ears eyes nose or mouth. Neck: JVD elevated no mass felt. Respiratory: Bilateral expiratory wheeze and no crepitations. Cardiovascular: S1-S2 heard. Abdomen: Soft nontender bowel sounds present. Musculoskeletal: Chronic skin changes in the lower extremity mostly on the right side from chronic venous stasis. Skin: Chronic venous stasis of the both lower extremity more on the right side. Neurologic: Alert awake oriented to time place and person.  Moves all extremities. Psychiatric: Appears normal per normal affect.   Labs on Admission: I have personally reviewed following labs and imaging  studies  CBC: Recent Labs  Lab 12/25/18 0213 12/25/18 0254  WBC 10.5  --   NEUTROABS 6.6  --   HGB 12.3* 13.3  HCT 40.1 39.0  MCV 90.9  --   PLT 353  --    Basic Metabolic Panel: Recent Labs  Lab 12/22/18 1149 12/25/18 0213 12/25/18 0254  NA 140 135 137  K 5.0 5.0 4.6  CL 100 103  --   CO2 24 19*  --   GLUCOSE 164* 311*  --   BUN 28* 20  --   CREATININE 1.66* 1.60*  --  CALCIUM 9.0 8.6*  --    GFR: Estimated Creatinine Clearance: 50.2 mL/min (A) (by C-G formula based on SCr of 1.6 mg/dL (H)). Liver Function Tests: Recent Labs  Lab 12/25/18 0213  AST 23  ALT 28  ALKPHOS 137*  BILITOT 0.5  PROT 7.9  ALBUMIN 3.3*   No results for input(s): LIPASE, AMYLASE in the last 168 hours. No results for input(s): AMMONIA in the last 168 hours. Coagulation Profile: No results for input(s): INR, PROTIME in the last 168 hours. Cardiac Enzymes: No results for input(s): CKTOTAL, CKMB, CKMBINDEX, TROPONINI in the last 168 hours. BNP (last 3 results) No results for input(s): PROBNP in the last 8760 hours. HbA1C: No results for input(s): HGBA1C in the last 72 hours. CBG: No results for input(s): GLUCAP in the last 168 hours. Lipid Profile: No results for input(s): CHOL, HDL, LDLCALC, TRIG, CHOLHDL, LDLDIRECT in the last 72 hours. Thyroid Function Tests: No results for input(s): TSH, T4TOTAL, FREET4, T3FREE, THYROIDAB in the last 72 hours. Anemia Panel: No results for input(s): VITAMINB12, FOLATE, FERRITIN, TIBC, IRON, RETICCTPCT in the last 72 hours. Urine analysis:    Component Value Date/Time   COLORURINE YELLOW 06/01/2018 2157   APPEARANCEUR CLEAR 06/01/2018 2157   LABSPEC 1.011 06/01/2018 2157   PHURINE 5.0 06/01/2018 2157   GLUCOSEU 50 (A) 06/01/2018 2157   HGBUR NEGATIVE 06/01/2018 2157   BILIRUBINUR NEGATIVE 06/01/2018 2157   KETONESUR NEGATIVE 06/01/2018 2157   PROTEINUR 100 (A) 06/01/2018 2157   UROBILINOGEN 1.0 05/04/2015 1415   NITRITE NEGATIVE  06/01/2018 2157   LEUKOCYTESUR NEGATIVE 06/01/2018 2157   Sepsis Labs: @LABRCNTIP (procalcitonin:4,lacticidven:4) ) Recent Results (from the past 240 hour(s))  SARS Coronavirus 2 (CEPHEID- Performed in [facility] hospital lab), Hosp Order     Status: None   Collection Time: 12/25/18  2:09 AM   Specimen: Nasopharyngeal Swab  Result Value Ref Range Status   SARS Coronavirus 2 NEGATIVE NEGATIVE Final    Comment: (NOTE) If result is NEGATIVE SARS-CoV-2 target nucleic acids are NOT DETECTED. The SARS-CoV-2 RNA is generally detectable in upper and lower  respiratory specimens during the acute phase of infection. The lowest  concentration of SARS-CoV-2 viral copies this assay can detect is 250  copies / mL. A negative result does not preclude SARS-CoV-2 infection  and should not be used as the sole basis for treatment or other  patient management decisions.  A negative result may occur with  improper specimen collection / handling, submission of specimen other  than nasopharyngeal swab, presence of viral mutation(s) within the  areas targeted by this assay, and inadequate number of viral copies  (<250 copies / mL). A negative result must be combined with clinical  observations, patient history, and epidemiological information. If result is POSITIVE SARS-CoV-2 target nucleic acids are DETECTED. The SARS-CoV-2 RNA is generally detectable in upper and lower  respiratory specimens dur ing the acute phase of infection.  Positive  results are indicative of active infection with SARS-CoV-2.  Clinical  correlation with patient history and other diagnostic information is  necessary to determine patient infection status.  Positive results do  not rule out bacterial infection or co-infection with other viruses. If result is PRESUMPTIVE POSTIVE SARS-CoV-2 nucleic acids MAY BE PRESENT.   A presumptive positive result was obtained on the submitted specimen  and confirmed on repeat testing.  While  2019 novel coronavirus  (SARS-CoV-2) nucleic acids may be present in the submitted sample  additional confirmatory testing may be necessary for epidemiological  and / or clinical management purposes  to differentiate between  SARS-CoV-2 and other Sarbecovirus currently known to infect humans.  If clinically indicated additional testing with an alternate test  methodology [phone]([id number]) is advised. The SARS-CoV-2 RNA is generally  detectable in upper and lower respiratory sp ecimens during the acute  phase of infection. The expected result is Negative. Fact Sheet for Patients:  BoilerBrush.com.cyhttps://www.fda.gov/media/136312/download Fact Sheet for Healthcare Providers: https://pope.com/https://www.fda.gov/media/136313/download This test is not yet approved or cleared by the [place] FDA and has been authorized for detection and/or diagnosis of SARS-CoV-2 by FDA under an Emergency Use Authorization (EUA).  This EUA will remain in effect (meaning this test can be used) for the duration of the COVID-19 declaration under Section 564(b)(1) of the Act, 21 U.S.C. section 360bbb-3(b)(1), unless the authorization is terminated or revoked sooner. Performed at [facility], [address] [place], [place] [address]      Radiological Exams on Admission: Dg Chest Portable 1 View  Result Date: 12/25/2018 CLINICAL DATA:  Shortness of breath hypoxia and altered mental status. EXAM: PORTABLE CHEST 1 VIEW COMPARISON:  10/16/2018 FINDINGS: There are large areas of opacity within the right lung superimposed on bilateral mild interstitial pulmonary edema. There is mild cardiomegaly. No pneumothorax or pleural effusion. IMPRESSION: Large areas of consolidation in the right lung may indicate pneumonia or aspiration. Bilateral mild interstitial pulmonary edema. Electronically Signed   By: Deatra RobinsonKevin  Herman M.D.   On: 12/25/2018 02:30    EKG: Independently reviewed.  Sinus tachycardia with LBBB.  Assessment/Plan Principal Problem:    Acute respiratory failure with hypoxemia (HCC) Active Problems:   Diabetes mellitus (HCC)   HTN (hypertension)   Acute hypoxic and hypercarbic respiratory failure (HCC) in setting of bilateral pulmonary infiltrates. Presume CAP + pulmonary edema +/-ALI   CAP (community acquired pneumonia)   Acute on chronic combined systolic and diastolic CHF (congestive heart failure) (HCC)   Syncope    1. Acute respiratory failure with hypoxia -since onset of symptoms was sudden suspect likely could be from CHF.  However chest x-ray also shows pneumonia and also patient has been coughing up some phlegm after coming to the ER.  Patient received Lasix 80 mg I have placed patient on 40 IV every 12 follow intake output metabolic panel.  Patient will be continued on antibiotics and I have ordered CT chest to rule out any other cause for the consolidation.  Follow lactate procalcitonin cultures.  Last EF measured was in April 2020 was 45 to 50% with impaired relaxation.  Due to markedly elevated blood pressure patient was started on nitroglycerin infusion which could be tried to be weaned off after starting his home medication.  We are also trying to wean off the BiPAP. 2. Syncope -could be from hypoxia.  Patient had recent PEA cardiac arrest.  We will cycle cardiac markers closely monitor in stepdown.  May discuss with cardiology in the morning. 3. Hypertensive urgency was started on IV nitroglycerin.  May try to wean off her taking patient's home medications including Coreg and if blood pressure improved with IV Lasix. 4. Chronic disease stage III creatinine appears to be at baseline. 5. Diabetes mellitus type 2-with hyperglycemia for now we will keep patient on sliding scale coverage closely monitor.  Patient may need long-acting insulin if continues to remain hyperglycemic.  Repeat metabolic panel has been ordered. 6. Anemia follow CBC. 7. BPH and has some urinary retention at this time they are trying to place and  no  cath with coud. 8. Hyperlipidemia on statins. 9. On exam patient is wheezing for which I have kept patient on Xopenex nebulizer.  There was some suspicion cardiology notes that patient could possibly have COPD from secondhand smoking.   DVT prophylaxis: Lovenox. Code Status: Full code. Family Communication: Patient's wife. Disposition Plan: Home. Consults called: None. Admission status: Inpatient.   Rise Patience MD [facility] Pager [phone].  If 7PM-7AM, please contact night-coverage [email or web address] Password [facility]  12/25/2018, 6:32 AM

## 2018-12-25 NOTE — Progress Notes (Addendum)
Pharmacy Antibiotic Note  [name] is a 68 y.o. male admitted on 12/25/2018 with pneumonia.  Pharmacy has been consulted for vancomycin and zosyn dosing.  Plan: Vancomycin 2gm IV x 1 then 1500 mg IV q24 hours Zosyn 3.375 gm IV q8 hours F/u renal function, cultures and clinical course     No data recorded.  Recent Labs  Lab 12/22/18 1149 12/25/18 0213  WBC  --  10.5  CREATININE 1.66* 1.60*  LATICACIDVEN  --  2.9*    Estimated Creatinine Clearance: 50.2 mL/min (A) (by C-G formula based on SCr of 1.6 mg/dL (H)).    Allergies  Allergen Reactions  . Cashew Nut Oil Palpitations  . Percocet [Oxycodone-Acetaminophen] Other (See Comments)    Hallucintaions    Thanks for allowing pharmacy to be a part of this patient's care.  Excell Seltzer, PharmD Clinical Pharmacist

## 2018-12-25 NOTE — ED Notes (Signed)
Patient c/o not being able to urinate bladder scanned patient  500 in bladder. Attempted to in and out cath patient x 2 unsuccessful unable to pass foley, MD aware will attempt coude

## 2018-12-25 NOTE — ED Notes (Signed)
  Spoke with wife and updated on patients condition.

## 2018-12-25 NOTE — ED Notes (Signed)
Transported to CT 

## 2018-12-25 NOTE — ED Provider Notes (Signed)
[facility]   CSN: [id number] Arrival date & time: 12/25/18  0205     History   Chief Complaint Chief Complaint  Patient presents with   Shortness of Breath    HPI [name] is a 68 y.o. male.     Patient presents to the emergency department for evaluation of severe respiratory distress.  Family member found the patient sitting up in his chair, slumped over and unresponsive.  They placed him on nasal cannula oxygen but he did not improve.  EMS arrived on the scene and found him with saturations of 35%.  They placed him on a nonrebreather and he slowly became awake.  Patient reports severe difficulty with breathing at arrival.  He denies chest pain.  He reports that he felt fine earlier tonight, has not been sick recently.     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   wife states blocked 30%   Diabetes mellitus 10/17/2011   newly dx today   Edema leg    right leg has leaky valve and right foot swells   Emphysema    Excessive ear wax    Fatty liver    Hernia    near navel   Hyperlipidemia    Hypertension    Leaky heart valve    Neuropathy    Rheumatic fever    Slow urinary stream    TIA (transient ischemic attack)     Patient Active Problem List   Diagnosis Date Noted   Chronic systolic heart failure (HCC) 11/19/2018   Chronic venous insufficiency 06/24/2018   Respiratory failure (HCC) 06/01/2018   Leg swelling 05/06/2018   Acute respiratory failure with hypoxemia (HCC) 10/14/2017   Influenza-like illness 10/14/2017   Delirium 10/14/2017   Acute urinary retention 10/14/2017   CAP (community acquired pneumonia)    (HFpEF) heart failure with preserved ejection fraction (HCC), pulmonary edema and LBBB of unknown chronicity  10/29/2016   Elevated troponin I level, presume demand ischemia  10/29/2016   AKI (acute kidney injury)  (HCC) 10/29/2016   BPH (benign prostatic hyperplasia) 10/29/2016   Sepsis (HCC) 10/29/2016   Bacteremia due to Escherichia coli 10/29/2016   Fatty liver 10/29/2016   Acute pulmonary edema (HCC)    Acute hypoxic and hypercarbic respiratory failure (HCC) in setting of bilateral pulmonary infiltrates. Presume CAP + pulmonary edema +/-ALI 10/27/2016   Left-sided weakness 05/04/2015   TIA (transient ischemic attack) : Rule out. 05/04/2015   Hyperlipidemia 05/04/2015   Chest pain 10/10/2013   SOB (shortness of breath) 10/10/2013   Hyperglycemia without ketosis 10/17/2011   Diabetes mellitus (HCC) 10/17/2011   HTN (hypertension) 10/17/2011   Foot drop, left 10/17/2011   Impacted cerumen of right ear 10/17/2011   Dyspnea 10/17/2011   History of TIA (transient ischemic attack) 10/17/2011   Neuropathy (HCC) 10/17/2011   Aortic insufficiency 03/16/2011   Chest pain 03/16/2011    Past Surgical History:  Procedure Laterality Date   CIRCUMCISION     LITHOTRIPSY          Home Medications    Prior to Admission medications   Medication Sig Start Date End Date Taking? Authorizing Provider  albuterol (PROVENTIL HFA;VENTOLIN HFA) 108 (90 Base) MCG/ACT inhaler Inhale 2 puffs into the lungs every 6 (six) hours as needed for wheezing or shortness of breath. 10/18/17   Erick BlinksMemon, Jehanzeb, MD  aspirin EC 81 MG tablet Take 81 mg by  mouth daily.    [provider]  atorvastatin (LIPITOR) 40 MG tablet Take 1 tablet (40 mg total) by mouth every evening. 06/08/18   Dessa Phi, DO  carvedilol (COREG) 3.125 MG tablet Take 1 tablet (3.125 mg total) by mouth 2 (two) times daily with a meal. 11/19/18   Daune Perch, NP  finasteride (PROSCAR) 5 MG tablet Take 5 mg by mouth daily.    [provider]  furosemide (LASIX) 40 MG tablet Take 1 tablet (40 mg total) by mouth as needed for fluid or edema (Take 1 tablet as needed for increased wt gain (3lbs in one day or 5lbs in  a week)). 12/22/18   Kathyrn Drown D, NP  gabapentin (NEURONTIN) 300 MG capsule Take 300 mg by mouth 4 (four) times daily.     [provider]  hydrOXYzine (VISTARIL) 50 MG capsule Take 50 mg by mouth 3 (three) times daily.    [provider]  metFORMIN (GLUCOPHAGE) 1000 MG tablet Take 1 tablet by mouth 2 (two) times a day. 11/17/18   [provider]  montelukast (SINGULAIR) 10 MG tablet Take 10 mg by mouth at bedtime.    [provider]  Multiple Vitamin (MULTIVITAMIN) tablet Take 1 tablet by mouth daily.    [provider]  pantoprazole (PROTONIX) 40 MG tablet Take 1 tablet (40 mg total) by mouth daily. 10/11/13   Isaac Bliss, Rayford Halsted, MD  tamsulosin (FLOMAX) 0.4 MG CAPS capsule Take 0.4 mg by mouth daily. 05/03/15   [provider]    Family History Family History  Problem Relation Age of Onset   Emphysema Mother    Aneurysm Mother    Emphysema Father    Coronary artery disease Brother    Coronary artery disease Brother    Coronary artery disease Sister     Social History Social History   Tobacco Use   Smoking status: Never Smoker   Smokeless tobacco: Never Used  Substance Use Topics   Alcohol use: No   Drug use: No     Allergies   Cashew nut oil and Percocet [oxycodone-acetaminophen]   Review of Systems Review of Systems  Respiratory: Positive for shortness of breath.   All other systems reviewed and are negative.    Physical Exam Updated Vital Signs BP (!) 132/96    Pulse (!) 134    Resp (!) 30    SpO2 (!) 88%   Physical Exam Vitals signs and nursing note reviewed.  Constitutional:      General: He is not in acute distress.    Appearance: Normal appearance. He is well-developed.  HENT:     Head: Normocephalic and atraumatic.     Right Ear: Hearing normal.     Left Ear: Hearing normal.     Nose: Nose normal.  Eyes:     Conjunctiva/sclera: Conjunctivae normal.     Pupils: Pupils are  equal, round, and reactive to light.  Neck:     Musculoskeletal: Normal range of motion and neck supple.  Cardiovascular:     Rate and Rhythm: Regular rhythm. Tachycardia present.     Heart sounds: S1 normal and S2 normal. No murmur. No friction rub. No gallop.   Pulmonary:     Effort: Tachypnea, accessory muscle usage and respiratory distress present.     Breath sounds: Examination of the right-lower field reveals decreased breath sounds. Examination of the left-lower field reveals decreased breath sounds. Decreased breath sounds, rhonchi and rales present.  Chest:  Chest wall: No tenderness.  Abdominal:     General: Bowel sounds are normal.     Palpations: Abdomen is soft.     Tenderness: There is no abdominal tenderness. There is no guarding or rebound. Negative signs include Murphy's sign and McBurney's sign.     Hernia: No hernia is present.  Musculoskeletal: Normal range of motion.     Right lower leg: Edema present.     Left lower leg: Edema present.  Skin:    General: Skin is warm and dry.     Findings: No rash.  Neurological:     Mental Status: He is alert and oriented to person, place, and time.     GCS: GCS eye subscore is 4. GCS verbal subscore is 5. GCS motor subscore is 6.     Cranial Nerves: No cranial nerve deficit.     Sensory: No sensory deficit.     Coordination: Coordination normal.  Psychiatric:        Speech: Speech normal.        Behavior: Behavior normal.        Thought Content: Thought content normal.      ED Treatments / Results  Labs (all labs ordered are listed, but only abnormal results are displayed) Labs Reviewed  CBC WITH DIFFERENTIAL/PLATELET - Abnormal; Notable for the following components:      Result Value   Hemoglobin 12.3 (*)    All other components within normal limits  COMPREHENSIVE METABOLIC PANEL - Abnormal; Notable for the following components:   CO2 19 (*)    Glucose, Bld 311 (*)    Creatinine, Ser 1.60 (*)    Calcium 8.6  (*)    Albumin 3.3 (*)    Alkaline Phosphatase 137 (*)    GFR calc non Af Amer 43 (*)    GFR calc Af Amer 50 (*)    All other components within normal limits  LACTIC ACID, PLASMA - Abnormal; Notable for the following components:   Lactic Acid, Venous 2.9 (*)    All other components within normal limits  BRAIN NATRIURETIC PEPTIDE - Abnormal; Notable for the following components:   B Natriuretic Peptide 172.1 (*)    All other components within normal limits  POCT I-STAT 7, (LYTES, BLD GAS, ICA,H+H) - Abnormal; Notable for the following components:   pH, Arterial 7.282 (*)    pO2, Arterial 72.0 (*)    Acid-base deficit 4.0 (*)    All other components within normal limits  SARS CORONAVIRUS 2 (HOSPITAL ORDER, PERFORMED IN [facility])  TROPONIN I (HIGH SENSITIVITY)  TROPONIN I (HIGH SENSITIVITY)  I-STAT ARTERIAL BLOOD GAS, ED    EKG    Radiology Dg Chest Portable 1 View  Result Date: 12/25/2018 CLINICAL DATA:  Shortness of breath hypoxia and altered mental status. EXAM: PORTABLE CHEST 1 VIEW COMPARISON:  10/16/2018 FINDINGS: There are large areas of opacity within the right lung superimposed on bilateral mild interstitial pulmonary edema. There is mild cardiomegaly. No pneumothorax or pleural effusion. IMPRESSION: Large areas of consolidation in the right lung may indicate pneumonia or aspiration. Bilateral mild interstitial pulmonary edema. Electronically Signed   By: Deatra RobinsonKevin  Herman M.D.   On: 12/25/2018 02:30    Procedures Procedures (including critical care time)  Medications Ordered in ED Medications  nitroGLYCERIN 50 mg in dextrose 5 % 250 mL (0.2 mg/mL) infusion (10 mcg/min Intravenous New Bag/Given 12/25/18 0222)  ondansetron (ZOFRAN) injection 4 mg (has no administration in time range)  furosemide (  LASIX) 10 MG/ML injection (has no administration in time range)  nitroGLYCERIN 0.2 mg/mL in dextrose 5 % infusion (has no administration in time range)  morphine 2  MG/ML injection (has no administration in time range)  aspirin 81 MG chewable tablet (has no administration in time range)  furosemide (LASIX) injection 80 mg (80 mg Intravenous Given 12/25/18 0219)  morphine 2 MG/ML injection 2 mg (2 mg Intravenous Given 12/25/18 0220)  aspirin chewable tablet 324 mg (324 mg Oral Given 12/25/18 0219)     Initial Impression / Assessment and Plan / ED Course  I have reviewed the triage vital signs and the nursing notes.  Pertinent labs & imaging results that were available during my care of the patient were reviewed by me and considered in my medical decision making (see chart for details).        Patient arrives in severe respiratory distress via EMS.  Patient reportedly had been feeling fine this evening, fell asleep in a chair.  Family found him slumped over and they could not wake him.  EMS report profound hypoxia upon their arrival which has slightly improved with nonrebreather facemask.  At arrival he is still in respiratory distress with oxygen saturations around 50% on nonrebreather facemask.  Patient has significant bilateral edema of his lower extremities.  Chest x-ray showed bilateral opacities.  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  Based on the rapid onset of this, pulmonary edema was considered likely.  He was very hypertensive at arrival.  Patient given IV morphine, IV Lasix, IV nitroglycerin drip.  He was placed on BiPAP.  Oxygenation significantly improved and he reported significant relief of his dyspnea.  Chest x-ray was ultimately read by radiology as possible bilateral pneumonia.  Will cover for aspiration with Zosyn and vancomycin.  Blood gas shows mild hypoxia with no CO2 retention.  Will require admission for acute respiratory failure.  CRITICAL CARE Performed by: Gilda Creasehristopher J Demond Shallenberger   Total critical care time: 40 minutes  Critical care time was exclusive of separately  billable procedures and treating other patients.  Critical care was necessary to treat or prevent imminent or life-threatening deterioration.  Critical care was time spent personally by me on the following activities: development of treatment plan with patient and/or surrogate as well as nursing, discussions with consultants, evaluation of patient's response to treatment, examination of patient, obtaining history from patient or surrogate, ordering and performing treatments and interventions, ordering and review of laboratory studies, ordering and review of radiographic studies, pulse oximetry and re-evaluation of patient's condition.   Final Clinical Impressions(s) / ED Diagnoses   Final diagnoses:  None    ED Discharge Orders    None       Steele Stracener, Canary Brimhristopher J, MD 12/25/18 [phone]

## 2018-12-25 NOTE — Progress Notes (Signed)
Paged Dr. Clementeen Graham critical lactic acid 2.2.

## 2018-12-25 NOTE — ED Notes (Signed)
Called patient wife Horris Latino , message left .

## 2018-12-25 NOTE — ED Notes (Signed)
ED TO INPATIENT HANDOFF REPORT  ED Nurse Name and Phone #: Lew Dawes RN [phone]  S Name/Age/Gender [name] 68 y.o. male Room/Bed: TRAAC/TRAAC  Code Status   Code Status: Full Code  Home/SNF/Other Home Patient oriented to: situation time place self Is this baseline? Yes   Triage Complete: Triage complete  Chief Complaint SOB  Triage Note Pt arrived via [facility], pt found to be unresponsive. When EMS arrived pts spow 35%. Pt placed on non-rebreather with spo2 up to 80% and improved mental status. Pt has hx of MI.     Allergies Allergies  Allergen Reactions  . Cashew Nut Oil Palpitations  . Percocet [Oxycodone-Acetaminophen] Other (See Comments)    Hallucintaions    Level of Care/Admitting Diagnosis ED Disposition    ED Disposition Condition Comment   Admit  [facility] [[id number]]  Level of Care: Progressive [102]  Covid Evaluation: N/A  Diagnosis: Acute respiratory failure with hypoxia [facility]) [[id number]]  Admitting Physician: Eduard Clos [phone]  Attending Physician: Eduard Clos [phone]  Estimated length of stay: past midnight tomorrow  Certification:: I certify this patient will need inpatient services for at least 2 midnights  PT Class (Do Not Modify): Inpatient [101]  PT Acc Code (Do Not Modify): Private [1]       B Medical/Surgery History Past Medical History:  Diagnosis Date  . 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 Diabetes mellitus 10/17/2011   newly dx today  . Edema leg    right leg has leaky valve and right foot swells  . Emphysema   . Excessive ear wax   . Fatty liver   . Hernia    near navel  . Hyperlipidemia   . Hypertension   . Leaky heart valve   . Neuropathy   . Rheumatic fever   . Slow urinary stream   . TIA (transient ischemic attack)    Past Surgical History:  Procedure Laterality Date  . CIRCUMCISION    . LITHOTRIPSY       A IV  Location/Drains/Wounds Patient Lines/Drains/Airways Status   Active Line/Drains/Airways    Name:   Placement date:   Placement time:   Site:   Days:   Peripheral IV 12/25/18 Left;Upper Arm   12/25/18    0216    Arm   less than 1   Peripheral IV 12/25/18 Left;Upper Arm   12/25/18    0557    Arm   less than 1   Urethral Catheter Lew Dawes RN Coude 16 Fr.   12/25/18    9562    Coude   less than 1          Intake/Output Last 24 hours  Intake/Output Summary (Last 24 hours) at 12/25/2018 0844 Last data filed at 12/25/2018 1308 Gross per 24 hour  Intake 1800 ml  Output 1700 ml  Net 100 ml    Labs/Imaging Results for orders placed or performed during the hospital encounter of 12/25/18 (from the past 48 hour(s))  SARS Coronavirus 2 (CEPHEID- Performed in [facility] hospital lab), Hosp Order     Status: None   Collection Time: 12/25/18  2:09 AM   Specimen: Nasopharyngeal Swab  Result Value Ref Range   SARS Coronavirus 2 NEGATIVE NEGATIVE    Comment: (NOTE) If result is NEGATIVE SARS-CoV-2 target nucleic acids are NOT DETECTED. The SARS-CoV-2 RNA is generally detectable in upper and lower  respiratory specimens during  the acute phase of infection. The lowest  concentration of SARS-CoV-2 viral copies this assay can detect is 250  copies / mL. A negative result does not preclude SARS-CoV-2 infection  and should not be used as the sole basis for treatment or other  patient management decisions.  A negative result may occur with  improper specimen collection / handling, submission of specimen other  than nasopharyngeal swab, presence of viral mutation(s) within the  areas targeted by this assay, and inadequate number of viral copies  (<250 copies / mL). A negative result must be combined with clinical  observations, patient history, and epidemiological information. If result is POSITIVE SARS-CoV-2 target nucleic acids are DETECTED. The SARS-CoV-2 RNA is generally detectable in upper and  lower  respiratory specimens dur ing the acute phase of infection.  Positive  results are indicative of active infection with SARS-CoV-2.  Clinical  correlation with patient history and other diagnostic information is  necessary to determine patient infection status.  Positive results do  not rule out bacterial infection or co-infection with other viruses. If result is PRESUMPTIVE POSTIVE SARS-CoV-2 nucleic acids MAY BE PRESENT.   A presumptive positive result was obtained on the submitted specimen  and confirmed on repeat testing.  While 2019 novel coronavirus  (SARS-CoV-2) nucleic acids may be present in the submitted sample  additional confirmatory testing may be necessary for epidemiological  and / or clinical management purposes  to differentiate between  SARS-CoV-2 and other Sarbecovirus currently known to infect humans.  If clinically indicated additional testing with an alternate test  methodology [phone]([id number]) is advised. The SARS-CoV-2 RNA is generally  detectable in upper and lower respiratory sp ecimens during the acute  phase of infection. The expected result is Negative. Fact Sheet for Patients:  BoilerBrush.com.cyhttps://www.fda.gov/media/136312/download Fact Sheet for Healthcare Providers: https://pope.com/https://www.fda.gov/media/136313/download This test is not yet approved or cleared by the [place] FDA and has been authorized for detection and/or diagnosis of SARS-CoV-2 by FDA under an Emergency Use Authorization (EUA).  This EUA will remain in effect (meaning this test can be used) for the duration of the COVID-19 declaration under Section 564(b)(1) of the Act, 21 U.S.C. section 360bbb-3(b)(1), unless the authorization is terminated or revoked sooner. Performed at [facility], [address] [place], [place] [id number]   CBC with Differential/Platelet     Status: Abnormal   Collection Time: 12/25/18  2:13 AM  Result Value Ref Range   WBC 10.5 4.0 - 10.5 K/uL   RBC 4.41 4.22 - 5.81  MIL/uL   Hemoglobin 12.3 (L) 13.0 - 17.0 g/dL   HCT 81.140.1 91.439.0 - 78.252.0 %   MCV 90.9 80.0 - 100.0 fL   MCH 27.9 26.0 - 34.0 pg   MCHC 30.7 30.0 - 36.0 g/dL   RDW 95.613.9 21.311.5 - 08.615.5 %   Platelets 353 150 - 400 K/uL   nRBC 0.0 0.0 - 0.2 %   Neutrophils Relative % 62 %   Neutro Abs 6.6 1.7 - 7.7 K/uL   Lymphocytes Relative 27 %   Lymphs Abs 2.8 0.7 - 4.0 K/uL   Monocytes Relative 6 %   Monocytes Absolute 0.6 0.1 - 1.0 K/uL   Eosinophils Relative 3 %   Eosinophils Absolute 0.3 0.0 - 0.5 K/uL   Basophils Relative 1 %   Basophils Absolute 0.1 0.0 - 0.1 K/uL   Immature Granulocytes 1 %   Abs Immature Granulocytes 0.05 0.00 - 0.07 K/uL    Comment: Performed at [facility], [address] [place], [place] [address]  Comprehensive metabolic panel     Status: Abnormal   Collection Time: 12/25/18  2:13 AM  Result Value Ref Range   Sodium 135 135 - 145 mmol/L   Potassium 5.0 3.5 - 5.1 mmol/L   Chloride 103 98 - 111 mmol/L   CO2 19 (L) 22 - 32 mmol/L   Glucose, Bld 311 (H) 70 - 99 mg/dL   BUN 20 8 - 23 mg/dL   Creatinine, Ser 6.04 (H) 0.61 - 1.24 mg/dL   Calcium 8.6 (L) 8.9 - 10.3 mg/dL   Total Protein 7.9 6.5 - 8.1 g/dL   Albumin 3.3 (L) 3.5 - 5.0 g/dL   AST 23 15 - 41 U/L   ALT 28 0 - 44 U/L   Alkaline Phosphatase 137 (H) 38 - 126 U/L   Total Bilirubin 0.5 0.3 - 1.2 mg/dL   GFR calc non Af Amer 43 (L) >60 mL/min   GFR calc Af Amer 50 (L) >60 mL/min   Anion gap 13 5 - 15    Comment: Performed at [facility], [address] [place], [place] [address]  Lactic acid, plasma     Status: Abnormal   Collection Time: 12/25/18  2:13 AM  Result Value Ref Range   Lactic Acid, Venous 2.9 (HH) 0.5 - 1.9 mmol/L    Comment: CRITICAL RESULT CALLED TO, READ BACK BY AND VERIFIED WITH: GIBSON,K RN 12/25/2018 0307 JORDANS Performed at [facility], [address] [place], [place] [address]   Troponin I (High Sensitivity)     Status: None   Collection Time: 12/25/18  2:13 AM   Result Value Ref Range   Troponin I (High Sensitivity) 2 <18 ng/L    Comment: (NOTE) Elevated high sensitivity troponin I (hsTnI) values and significant  changes across serial measurements may suggest ACS but many other  chronic and acute conditions are known to elevate hsTnI results.  Refer to the "Links" section for chest pain algorithms and additional  guidance. Performed at [facility], [address], [place], [place] [address]   Brain natriuretic peptide     Status: Abnormal   Collection Time: 12/25/18  2:14 AM  Result Value Ref Range   B Natriuretic Peptide 172.1 (H) 0.0 - 100.0 pg/mL    Comment: Performed at [facility], [address], [place], [place] [address]  I-STAT 7, (LYTES, BLD GAS, ICA, H+H)     Status: Abnormal   Collection Time: 12/25/18  2:54 AM  Result Value Ref Range   pH, Arterial 7.282 (L) 7.350 - 7.450   pCO2 arterial 47.6 32.0 - 48.0 mmHg   pO2, Arterial 72.0 (L) 83.0 - 108.0 mmHg   Bicarbonate 22.5 20.0 - 28.0 mmol/L   TCO2 24 22 - 32 mmol/L   O2 Saturation 92.0 %   Acid-base deficit 4.0 (H) 0.0 - 2.0 mmol/L   Sodium 137 135 - 145 mmol/L   Potassium 4.6 3.5 - 5.1 mmol/L   Calcium, Ion 1.15 1.15 - 1.40 mmol/L   HCT 39.0 39.0 - 52.0 %   Hemoglobin 13.3 13.0 - 17.0 g/dL   Patient temperature 86.5 F    Collection site BRACHIAL ARTERY    Sample type ARTERIAL   Troponin I (High Sensitivity)     Status: None   Collection Time: 12/25/18  3:59 AM  Result Value Ref Range   Troponin I (High Sensitivity) 4 <18 ng/L  Comment: (NOTE) Elevated high sensitivity troponin I (hsTnI) values and significant  changes across serial measurements may suggest ACS but many other  chronic and acute conditions are known to elevate hsTnI results.  Refer to the "Links" section for chest pain algorithms and additional  guidance. Performed at [facility], [address] [place], [place] [address]   Comprehensive metabolic panel     Status: Abnormal    Collection Time: 12/25/18  7:16 AM  Result Value Ref Range   Sodium 139 135 - 145 mmol/L   Potassium 4.7 3.5 - 5.1 mmol/L   Chloride 102 98 - 111 mmol/L   CO2 27 22 - 32 mmol/L   Glucose, Bld 228 (H) 70 - 99 mg/dL   BUN 21 8 - 23 mg/dL   Creatinine, Ser 6.04 (H) 0.61 - 1.24 mg/dL   Calcium 8.5 (L) 8.9 - 10.3 mg/dL   Total Protein 7.1 6.5 - 8.1 g/dL   Albumin 2.9 (L) 3.5 - 5.0 g/dL   AST 20 15 - 41 U/L   ALT 31 0 - 44 U/L   Alkaline Phosphatase 117 38 - 126 U/L   Total Bilirubin 0.9 0.3 - 1.2 mg/dL   GFR calc non Af Amer 48 (L) >60 mL/min   GFR calc Af Amer 55 (L) >60 mL/min   Anion gap 10 5 - 15    Comment: Performed at [facility], [address] [place], [place] [address]  CBC WITH DIFFERENTIAL     Status: Abnormal   Collection Time: 12/25/18  7:16 AM  Result Value Ref Range   WBC 14.3 (H) 4.0 - 10.5 K/uL   RBC 4.14 (L) 4.22 - 5.81 MIL/uL   Hemoglobin 11.6 (L) 13.0 - 17.0 g/dL   HCT 11.9 (L) 14.7 - 82.9 %   MCV 90.6 80.0 - 100.0 fL   MCH 28.0 26.0 - 34.0 pg   MCHC 30.9 30.0 - 36.0 g/dL   RDW 56.2 13.0 - 86.5 %   Platelets 268 150 - 400 K/uL   nRBC 0.0 0.0 - 0.2 %   Neutrophils Relative % 89 %   Neutro Abs 12.9 (H) 1.7 - 7.7 K/uL   Lymphocytes Relative 5 %   Lymphs Abs 0.7 0.7 - 4.0 K/uL   Monocytes Relative 4 %   Monocytes Absolute 0.5 0.1 - 1.0 K/uL   Eosinophils Relative 1 %   Eosinophils Absolute 0.1 0.0 - 0.5 K/uL   Basophils Relative 1 %   Basophils Absolute 0.1 0.0 - 0.1 K/uL   Immature Granulocytes 0 %   Abs Immature Granulocytes 0.04 0.00 - 0.07 K/uL    Comment: Performed at [facility], [address], [place], [place] [address]  TSH     Status: None   Collection Time: 12/25/18  7:16 AM  Result Value Ref Range   TSH 2.029 0.350 - 4.500 uIU/mL    Comment: Performed by a 3rd Generation assay with a functional sensitivity of <=0.01 uIU/mL. Performed at [facility], [address], [place], [place] [address]   Troponin I (High  Sensitivity)     Status: None   Collection Time: 12/25/18  7:16 AM  Result Value Ref Range   Troponin I (High Sensitivity) 12 <18 ng/L    Comment: (NOTE) Elevated high sensitivity troponin I (hsTnI) values and significant  changes across serial measurements may suggest ACS but many other  chronic and acute conditions are known to elevate hsTnI results.  Refer to the "Links" section for chest pain algorithms and additional  guidance. Performed at [facility], [place] [address] [place], [place] [address]   Magnesium     Status: Abnormal   Collection Time: 12/25/18  7:16 AM  Result Value Ref Range   Magnesium 1.5 (L) 1.7 - 2.4 mg/dL    Comment: Performed at [facility] [address], [place], [place] [address]  Lactic acid, plasma     Status: None   Collection Time: 12/25/18  7:16 AM  Result Value Ref Range   Lactic Acid, Venous 1.9 0.5 - 1.9 mmol/L    Comment: Performed at [facility] [address] [place], [place] [address]  CBG monitoring, ED     Status: Abnormal   Collection Time: 12/25/18  8:03 AM  Result Value Ref Range   Glucose-Capillary 205 (H) 70 - 99 mg/dL   Comment 1 Notify RN    Comment 2 Document in Chart    Ct Chest Wo Contrast  Result Date: 12/25/2018 CLINICAL DATA:  Respiratory distress. EXAM: CT CHEST WITHOUT CONTRAST TECHNIQUE: Multidetector CT imaging of the chest was performed following the standard protocol without IV contrast. COMPARISON:  Radiographs of same day.  CT scan of October 11, 2013. FINDINGS: Cardiovascular: 5.1 cm ascending thoracic aortic aneurysm is noted. This is enlarged compared to prior exam. Atherosclerosis of thoracic aorta is noted. Normal cardiac size. No pericardial effusion. Mediastinum/Nodes: No enlarged mediastinal or axillary lymph nodes, although visualization is limited due the lack of intravenous contrast. Thyroid gland, trachea, and esophagus demonstrate no significant findings. Lungs/Pleura: No pneumothorax or  significant pleural effusion is noted. Bilateral airspace opacities are noted, most prominently seen in the right upper and bilateral lower lobes. This is most consistent with multifocal pneumonia. Upper Abdomen: No acute abnormality. Musculoskeletal: Old bilateral rib fractures are noted. No acute abnormality is noted. IMPRESSION: Large bilateral airspace opacities are noted, most consistent with multifocal pneumonia. 5.1 cm ascending thoracic aortic aneurysm. Recommend semi-annual imaging followup by CTA or MRA and referral to cardiothoracic surgery if not already obtained. This recommendation follows 2010 ACCF/AHA/AATS/ACR/ASA/SCA/SCAI/SIR/STS/SVM Guidelines for the Diagnosis and Management of Patients With Thoracic Aortic Disease. Circulation. 2010; 121: T465-K812. Aortic aneurysm NOS (ICD10-I71.9). Aortic Atherosclerosis (ICD10-I70.0). Electronically Signed   By: Marijo Conception M.D.   On: 12/25/2018 08:36   Dg Chest Portable 1 View  Result Date: 12/25/2018 CLINICAL DATA:  Shortness of breath hypoxia and altered mental status. EXAM: PORTABLE CHEST 1 VIEW COMPARISON:  10/16/2018 FINDINGS: There are large areas of opacity within the right lung superimposed on bilateral mild interstitial pulmonary edema. There is mild cardiomegaly. No pneumothorax or pleural effusion. IMPRESSION: Large areas of consolidation in the right lung may indicate pneumonia or aspiration. Bilateral mild interstitial pulmonary edema. Electronically Signed   By: Ulyses Jarred M.D.   On: 12/25/2018 02:30    Pending Labs Unresulted Labs (From admission, onward)    Start     Ordered   01/01/19 0500  Creatinine, serum  (enoxaparin (LOVENOX)    CrCl >/= 30 ml/min)  Weekly,   R    Comments: while on enoxaparin therapy    12/25/18 0631   12/26/18 7517  Basic metabolic panel  Tomorrow morning,   R     12/25/18 0631   12/26/18 0500  CBC  Tomorrow morning,   R     12/25/18 0631   12/25/18 0831  C-reactive protein  Add-on,   AD  12/25/18 0830   12/25/18 0634  Procalcitonin - Baseline  ONCE - STAT,   STAT     12/25/18 [id number]   12/25/18 0634  Lactic acid, plasma  STAT Now then every 3 hours,   R (with STAT occurrences)     12/25/18 [id number]   12/25/18 0631  Troponin I (High Sensitivity)  STAT Now then every 2 hours,   R (with STAT occurrences)    Question:  Indication  Answer:  Suspect ACS   12/25/18 0631   12/25/18 0629  HIV antibody (Routine Testing)  Once,   STAT     12/25/18 0631   12/25/18 0629  CBC  (enoxaparin (LOVENOX)    CrCl >/= 30 ml/min)  Once,   STAT    Comments: Baseline for enoxaparin therapy IF NOT ALREADY DRAWN.  Notify MD if PLT < 100 K.    12/25/18 0631          Vitals/Pain Today's Vitals   12/25/18 0630 12/25/18 0645 12/25/18 0700 12/25/18 0710  BP: (!) 142/85 121/75 111/74   Pulse: (!) 103 99 98 (!) 104  Resp: 19 17 (!) 31 20  SpO2: 91% 91% 92%   PainSc:        Isolation Precautions No active isolations  Medications Medications  nitroGLYCERIN 50 mg in dextrose 5 % 250 mL (0.2 mg/mL) infusion (0 mcg/min Intravenous Stopped 12/25/18 0839)  ondansetron (ZOFRAN) injection 4 mg (has no administration in time range)  furosemide (LASIX) 10 MG/ML injection (has no administration in time range)  nitroGLYCERIN 0.2 mg/mL in dextrose 5 % infusion (has no administration in time range)  morphine 2 MG/ML injection (has no administration in time range)  aspirin 81 MG chewable tablet (has no administration in time range)  piperacillin-tazobactam (ZOSYN) IVPB 3.375 g (has no administration in time range)  vancomycin (VANCOCIN) 1,500 mg in sodium chloride 0.9 % 500 mL IVPB (has no administration in time range)  aspirin EC tablet 81 mg (has no administration in time range)  atorvastatin (LIPITOR) tablet 40 mg (has no administration in time range)  carvedilol (COREG) tablet 3.125 mg (has no administration in time range)  pantoprazole (PROTONIX) EC tablet 40 mg (has no administration in time range)   finasteride (PROSCAR) tablet 5 mg (has no administration in time range)  tamsulosin (FLOMAX) capsule 0.4 mg (has no administration in time range)  gabapentin (NEURONTIN) capsule 300 mg (has no administration in time range)  multivitamin with minerals tablet 1 tablet (has no administration in time range)  montelukast (SINGULAIR) tablet 10 mg (has no administration in time range)  acetaminophen (TYLENOL) tablet 650 mg (has no administration in time range)    Or  acetaminophen (TYLENOL) suppository 650 mg (has no administration in time range)  enoxaparin (LOVENOX) injection 40 mg (has no administration in time range)  furosemide (LASIX) injection 40 mg (has no administration in time range)  levalbuterol (XOPENEX) nebulizer solution 0.63 mg (0.63 mg Nebulization Given 12/25/18 0710)  magnesium sulfate IVPB 4 g 100 mL (has no administration in time range)  insulin aspart (novoLOG) injection 0-15 Units (has no administration in time range)  furosemide (LASIX) injection 80 mg (80 mg Intravenous Given 12/25/18 0219)  morphine 2 MG/ML injection 2 mg (2 mg Intravenous Given 12/25/18 0220)  aspirin chewable tablet 324 mg (324 mg Oral Given 12/25/18 0219)  vancomycin (VANCOCIN) 2,000 mg in sodium chloride 0.9 % 500 mL IVPB (0 mg Intravenous Stopped 12/25/18 0800)  piperacillin-tazobactam (ZOSYN) IVPB 3.375 g (0 g Intravenous  Stopped 12/25/18 0444)    Mobility walks High fall risk   Focused Assessments Pulmonary Assessment Handoff:  Lung sounds: Bilateral Breath Sounds: Diminished L Breath Sounds: Diminished R Breath Sounds: Diminished O2 Device: Nasal Cannula O2 Flow Rate (L/min): 5 L/min      R Recommendations: See Admitting Provider Note  Report given to:   Additional Notes:

## 2018-12-25 NOTE — ED Notes (Signed)
Pt placed on bipap  

## 2018-12-25 NOTE — ED Notes (Signed)
Patient peri care given prior to foley insertion and after insertion

## 2018-12-25 NOTE — Progress Notes (Addendum)
PROGRESS NOTE                                                                                                                                                                                                             Patient Demographics:    [name], is a 68 y.o. male, DOB - 08/05/1950, ZOX:[id number]  Admit date - 12/25/2018   Admitting Physician Eduard ClosArshad N Kakrakandy, MD  Outpatient Primary MD for the patient is Clinic, Medical, MD (Inactive)  LOS - 0  Outpatient Specialists:cardiology  Chief Complaint  Patient presents with   Shortness of Breath       Brief Narrative  68 year old male with chronic combined systolic and diastolic CHF (recent echo with EF of 45-50%), diabetes mellitus, hypertension who presented with acute onset of shortness of breath and found by EMS to be hypoxic with sats in the 30s.  Wife also concerned that he may have passed out.  Patient reported having some burning sensation in his chest and feeling very weak.  Denied any fevers, chills, nausea, vomiting, abdominal pain, bowel or urinary symptoms.  In April he was hospitalized for shortness of breath and found to have pneumonia and also a PEA arrest.  He was taken off scheduled Lasix by his cardiologist and placed on PRN Lasix recently. In the ED patient was in acute hypoxic respiratory failure and septic.  Placed on BiPAP.  He was tachycardic and tachypneic with elevated lactic acid of 2.9 but afebrile.  EKG showed sinus tachycardia with LBBB.  Patient found to be in hypertensive crisis.  BNP of 172 and high sensitive troponin of 2.  COVID-19 was negative. ABG showed pH of 7.28, PCO2 47.6 and PO2 of 32.  White count of 10.5.  Chest x-ray showed large area of right-sided consolidation concerning for pneumonia along with bilateral congestion.  Patient also placed on nitroglycerin drip. Admitted for acute on chronic CHF exacerbation and right-sided  pneumonia.   Subjective:   Patient seen in the ED.  He was taken off BiPAP and on 5 L O2 via nasal cannula.  Denies that he may have passed out.  Feels his breathing to have slightly improved since admission after receiving BiPAP and Lasix.  Denies any chest discomfort.   Assessment  & Plan :    Principal Problem:   Acute respiratory failure with  hypoxemia (HCC) Secondary to multifocal right-sided pneumonia (healthcare associated pneumonia) and acute on chronic systolic CHF. Off BiPAP and currently maintaining sats on 4 L via nasal cannula.  Continue empiric IV vancomycin and Zosyn.  Follow blood cultures.  CT chest done showing multifocal pneumonia.  No groundglass opacity.  COVID-19 was tested negative.  Lactic acid trending down.  CRP and procalcitonin normal. Has poor dentition.  Will obtain swallow evaluation.  I discussed with his wife and based on her history he may have sleep apnea ( frequent daytime dozing and snoring at night). Patient will benefit from sleep study as outpatient.  Active Problems: Acute on chronic combined systolic and diastolic CHF (HCC) Was taken off scheduled Lasix by his cardiologist recently.  Received 80 mg IV Lasix in the ED and placed on 40 mg IV Lasix every 12 hours.  Monitor lites.  Strict I's/O and daily weight.  Continue aspirin and statin.  ?  Syncope Could be associated with hypoxia.  Patient denies passing out.  Troponins normal.  Monitor closely on telemetry.  Given previous history of PEA will consult cardiology if any new changes. Needs follow up with cardiology as outpatient to set up event monitor as outpt.  Hypertensive crisis Started on IV nitroglycerin, now weaned off.  Resume home dose Coreg and monitor on IV Lasix.  Chronic kidney disease stage III Creatinine at baseline Slight diabetes mellitus type 2 On metformin.  Was hyperglycemic on presentation.  Monitor on sliding scale coverage.  Anemia of chronic disease Stable.   Monitor.  BPH Noted to have urinary retention of almost 1 L..  Coud catheter placed.  We will place him on Flomax.      Code Status : Full code  Family Communication  : spoke with wife on the phone  Disposition Plan  : Home pending clinical improvement, possibly in the next 48 hrs  Barriers For Discharge : Active symptoms  Consults  : None  Procedures  : CT chest  DVT Prophylaxis  :  Lovenox -   Lab Results  Component Value Date   PLT 268 12/25/2018    Antibiotics  :   Anti-infectives (From admission, onward)   Start     Dose/Rate Route Frequency Ordered Stop   12/26/18 0400  vancomycin (VANCOCIN) 1,500 mg in sodium chloride 0.9 % 500 mL IVPB     1,500 mg 250 mL/hr over 120 Minutes Intravenous Every 24 hours 12/25/18 0353     12/25/18 1200  piperacillin-tazobactam (ZOSYN) IVPB 3.375 g     3.375 g 12.5 mL/hr over 240 Minutes Intravenous Every 8 hours 12/25/18 0353     12/25/18 0345  vancomycin (VANCOCIN) 2,000 mg in sodium chloride 0.9 % 500 mL IVPB     2,000 mg 250 mL/hr over 120 Minutes Intravenous  Once 12/25/18 0344 12/25/18 0800   12/25/18 0345  piperacillin-tazobactam (ZOSYN) IVPB 3.375 g     3.375 g 100 mL/hr over 30 Minutes Intravenous  Once 12/25/18 0344 12/25/18 0444        Objective:   Vitals:   12/25/18 0845 12/25/18 0900 12/25/18 0946 12/25/18 1159  BP: 116/80 112/81 (!) 145/90 118/79  Pulse: 96 95 95 90  Resp: Temp:   98.1 F (36.7 C)   TempSrc:   Oral   SpO2: 94% 95% 96% 95%  Weight:   104 kg   Height:    (1.702 m)     Wt Readings from Last 3 Encounters:  12/25/18 104 kg  12/22/18 104.4 kg  11/19/18 104.3 kg     Intake/Output Summary (Last 24 hours) at 12/25/2018 1346 Last data filed at 12/25/2018 1100 Gross per 24 hour  Intake 2019.25 ml  Output 1700 ml  Net 319.25 ml     Physical Exam  Gen: Elderly male, fatigued HEENT: no pallor, moist mucosa, supple neck Chest: Bibasilar crackles, coarse breath sounds  on the right with diminished breath sounds CVS: N S1&S2, no murmurs, rubs or gallop GI: soft, NT, ND, BS+ Musculoskeletal: warm, 1+ pitting edema bilaterally     Data Review:    CBC Recent Labs  Lab 12/25/18 0213 12/25/18 0254 12/25/18 0716  WBC 10.5  --  14.3*  HGB 12.3* 13.3 11.6*  HCT 40.1 39.0 37.5*  PLT 353  --  268  MCV 90.9  --  90.6  MCH 27.9  --  28.0  MCHC 30.7  --  30.9  RDW 13.9  --  14.0  LYMPHSABS 2.8  --  0.7  MONOABS 0.6  --  0.5  EOSABS 0.3  --  0.1  BASOSABS 0.1  --  0.1    Chemistries  Recent Labs  Lab 12/22/18 1149 12/25/18 0213 12/25/18 0254 12/25/18 0716  NA 140 135 137 139  K 5.0 5.0 4.6 4.7  CL 100 103  --  102  CO2 24 19*  --  27  GLUCOSE 164* 311*  --  228*  BUN 28* 20  --  21  CREATININE 1.66* 1.60*  --  1.48*  CALCIUM 9.0 8.6*  --  8.5*  MG  --   --   --  1.5*  AST  --  23  --  20  ALT  --  28  --  31  ALKPHOS  --  137*  --  117  BILITOT  --  0.5  --  0.9   ------------------------------------------------------------------------------------------------------------------ No results for input(s): CHOL, HDL, LDLCALC, TRIG, CHOLHDL, LDLDIRECT in the last 72 hours.  Lab Results  Component Value Date   HGBA1C 8.0 (H) 10/16/2018   ------------------------------------------------------------------------------------------------------------------ Recent Labs    12/25/18 0716  TSH 2.029   ------------------------------------------------------------------------------------------------------------------ No results for input(s): VITAMINB12, FOLATE, FERRITIN, TIBC, IRON, RETICCTPCT in the last 72 hours.  Coagulation profile No results for input(s): INR, PROTIME in the last 168 hours.  No results for input(s): DDIMER in the last 72 hours.  Cardiac Enzymes No results for input(s): CKMB, TROPONINI, MYOGLOBIN in the last 168 hours.  Invalid input(s):  CK ------------------------------------------------------------------------------------------------------------------    Component Value Date/Time   BNP 172.1 (H) 12/25/2018 0214    Inpatient Medications  Scheduled Meds:  aspirin       aspirin EC  81 mg Oral Daily   atorvastatin  40 mg Oral QPM   carvedilol  3.125 mg Oral BID WC   enoxaparin (LOVENOX) injection  40 mg Subcutaneous Q24H   finasteride  5 mg Oral Daily   furosemide       furosemide  40 mg Intravenous Q12H   gabapentin  300 mg Oral QID   insulin aspart  0-15 Units Subcutaneous TID WC   levalbuterol  0.63 mg Nebulization Q6H   montelukast  10 mg Oral QHS   morphine       multivitamin with minerals  1 tablet Oral Daily   ondansetron  4 mg Intravenous Once   pantoprazole  40 mg Oral Daily   tamsulosin  0.4 mg Oral Daily   Continuous Infusions:  nitroGLYCERIN  nitroGLYCERIN Stopped (12/25/18 0839)   piperacillin-tazobactam (ZOSYN)  IV 3.375 g (12/25/18 1306)   [START ON 12/26/2018] vancomycin     PRN Meds:.acetaminophen **OR** acetaminophen  Micro Results Recent Results (from the past 240 hour(s))  SARS Coronavirus 2 (CEPHEID- Performed in [place] hospital lab), Hosp Order     Status: None   Collection Time: 12/25/18  2:09 AM   Specimen: Nasopharyngeal Swab  Result Value Ref Range Status   SARS Coronavirus 2 NEGATIVE NEGATIVE Final    Comment: (NOTE) If result is NEGATIVE SARS-CoV-2 target nucleic acids are NOT DETECTED. The SARS-CoV-2 RNA is generally detectable in upper and lower  respiratory specimens during the acute phase of infection. The lowest  concentration of SARS-CoV-2 viral copies this assay can detect is 250  copies / mL. A negative result does not preclude SARS-CoV-2 infection  and should not be used as the sole basis for treatment or other  patient management decisions.  A negative result may occur with  improper specimen collection / handling, submission of  specimen other  than nasopharyngeal swab, presence of viral mutation(s) within the  areas targeted by this assay, and inadequate number of viral copies  (<250 copies / mL). A negative result must be combined with clinical  observations, patient history, and epidemiological information. If result is POSITIVE SARS-CoV-2 target nucleic acids are DETECTED. The SARS-CoV-2 RNA is generally detectable in upper and lower  respiratory specimens dur ing the acute phase of infection.  Positive  results are indicative of active infection with SARS-CoV-2.  Clinical  correlation with patient history and other diagnostic information is  necessary to determine patient infection status.  Positive results do  not rule out bacterial infection or co-infection with other viruses. If result is PRESUMPTIVE POSTIVE SARS-CoV-2 nucleic acids MAY BE PRESENT.   A presumptive positive result was obtained on the submitted specimen  and confirmed on repeat testing.  While 2019 novel coronavirus  (SARS-CoV-2) nucleic acids may be present in the submitted sample  additional confirmatory testing may be necessary for epidemiological  and / or clinical management purposes  to differentiate between  SARS-CoV-2 and other Sarbecovirus currently known to infect humans.  If clinically indicated additional testing with an alternate test  methodology [id number]) is advised. The SARS-CoV-2 RNA is generally  detectable in upper and lower respiratory sp ecimens during the acute  phase of infection. The expected result is Negative. Fact Sheet for Patients:  StrictlyIdeas.no Fact Sheet for Healthcare Providers: BankingDealers.co.za This test is not yet approved or cleared by the [place] FDA and has been authorized for detection and/or diagnosis of SARS-CoV-2 by FDA under an Emergency Use Authorization (EUA).  This EUA will remain in effect (meaning this test can be used) for the  duration of the COVID-19 declaration under Section 564(b)(1) of the Act, 21 U.S.C. section 360bbb-3(b)(1), unless the authorization is terminated or revoked sooner. Performed at [facility], [place] [address], [place], [place] [address]     Radiology Reports Ct Chest Wo Contrast  Result Date: 12/25/2018 CLINICAL DATA:  Respiratory distress. EXAM: CT CHEST WITHOUT CONTRAST TECHNIQUE: Multidetector CT imaging of the chest was performed following the standard protocol without IV contrast. COMPARISON:  Radiographs of same day.  CT scan of October 11, 2013. FINDINGS: Cardiovascular: 5.1 cm ascending thoracic aortic aneurysm is noted. This is enlarged compared to prior exam. Atherosclerosis of thoracic aorta is noted. Normal cardiac size. No pericardial effusion. Mediastinum/Nodes: No enlarged mediastinal or axillary lymph nodes, although  visualization is limited due the lack of intravenous contrast. Thyroid gland, trachea, and esophagus demonstrate no significant findings. Lungs/Pleura: No pneumothorax or significant pleural effusion is noted. Bilateral airspace opacities are noted, most prominently seen in the right upper and bilateral lower lobes. This is most consistent with multifocal pneumonia. Upper Abdomen: No acute abnormality. Musculoskeletal: Old bilateral rib fractures are noted. No acute abnormality is noted. IMPRESSION: Large bilateral airspace opacities are noted, most consistent with multifocal pneumonia. 5.1 cm ascending thoracic aortic aneurysm. Recommend semi-annual imaging followup by CTA or MRA and referral to cardiothoracic surgery if not already obtained. This recommendation follows 2010 ACCF/AHA/AATS/ACR/ASA/SCA/SCAI/SIR/STS/SVM Guidelines for the Diagnosis and Management of Patients With Thoracic Aortic Disease. Circulation. 2010; 121: Z610-R604: E266-e369. Aortic aneurysm NOS (ICD10-I71.9). Aortic Atherosclerosis (ICD10-I70.0). Electronically Signed   By: Lupita RaiderJames  Green Jr M.D.   On: 12/25/2018  08:36   Dg Chest Portable 1 View  Result Date: 12/25/2018 CLINICAL DATA:  Shortness of breath hypoxia and altered mental status. EXAM: PORTABLE CHEST 1 VIEW COMPARISON:  10/16/2018 FINDINGS: There are large areas of opacity within the right lung superimposed on bilateral mild interstitial pulmonary edema. There is mild cardiomegaly. No pneumothorax or pleural effusion. IMPRESSION: Large areas of consolidation in the right lung may indicate pneumonia or aspiration. Bilateral mild interstitial pulmonary edema. Electronically Signed   By: Deatra RobinsonKevin  Herman M.D.   On: 12/25/2018 02:30    Time Spent in minutes  25   Ramisa Duman M.D on 12/25/2018 at 1:46 PM  Between 7am to 7pm - Pager - [phone]  After 7pm go to [email or web address] - password [email or web address]  [facility] -  Office  [phone]

## 2018-12-25 NOTE — Plan of Care (Signed)
  Problem: Clinical Measurements: Goal: Ability to maintain clinical measurements within normal limits will improve Outcome: Progressing Goal: Will remain free from infection Outcome: Progressing Goal: Diagnostic test results will improve Outcome: Progressing Goal: Respiratory complications will improve Outcome: Progressing Goal: Cardiovascular complication will be avoided Outcome: Progressing   Problem: Activity: Goal: Risk for activity intolerance will decrease Outcome: Progressing   Problem: Safety: Goal: Ability to remain free from injury will improve Outcome: Progressing   

## 2018-12-25 NOTE — ED Notes (Signed)
[place] pts wife

## 2018-12-25 NOTE — Progress Notes (Signed)
Paged MD to ask for wound consult for cellulitis on BLE.  Held gabapentin two doses of gabapentin to help improve alertness  Patient seems more alert and breathing better overall. Remains of 4L of oxygen via [place]

## 2018-12-25 NOTE — Progress Notes (Signed)
Patient admitted to room 5w10 from ED s/p acute respiratory failure with hypoxemia. Patient alert oriented x4. 4L oxygen via [place] 96%. Vitals stable. Admission assessment completed. Skin assessment completed MASD noted under left breast, under stomach skin folds, groin, buttocks, cellulitis BLE and CHG bath completed. Oriented to room, call bell at bedside and patient called wife. Denies any pain at this time. Will continue to monitor.

## 2018-12-25 NOTE — ED Triage Notes (Signed)
Pt arrived via [facility], pt found to be unresponsive. When EMS arrived pts spow 35%. Pt placed on non-rebreather with spo2 up to 80% and improved mental status. Pt has hx of MI.

## 2018-12-26 DIAGNOSIS — I5043 Acute on chronic combined systolic (congestive) and diastolic (congestive) heart failure: Secondary | ICD-10-CM

## 2018-12-26 DIAGNOSIS — J181 Lobar pneumonia, unspecified organism: Secondary | ICD-10-CM

## 2018-12-26 LAB — CBC
HCT: 35.5 % — ABNORMAL LOW (ref 39.0–52.0)
Hemoglobin: 11.4 g/dL — ABNORMAL LOW (ref 13.0–17.0)
MCH: 27.7 pg (ref 26.0–34.0)
MCHC: 32.1 g/dL (ref 30.0–36.0)
MCV: 86.2 fL (ref 80.0–100.0)
Platelets: 275 10*3/uL (ref 150–400)
RBC: 4.12 MIL/uL — ABNORMAL LOW (ref 4.22–5.81)
RDW: 13.8 % (ref 11.5–15.5)
WBC: 12.2 10*3/uL — ABNORMAL HIGH (ref 4.0–10.5)
nRBC: 0.2 % (ref 0.0–0.2)

## 2018-12-26 LAB — GLUCOSE, CAPILLARY
Glucose-Capillary: 209 mg/dL — ABNORMAL HIGH (ref 70–99)
Glucose-Capillary: 193 mg/dL — ABNORMAL HIGH (ref 70–99)
Glucose-Capillary: 306 mg/dL — ABNORMAL HIGH (ref 70–99)
Glucose-Capillary: 196 mg/dL — ABNORMAL HIGH (ref 70–99)

## 2018-12-26 LAB — BASIC METABOLIC PANEL
Anion gap: 12 (ref 5–15)
BUN: 25 mg/dL — ABNORMAL HIGH (ref 8–23)
CO2: 27 mmol/L (ref 22–32)
Calcium: 8.5 mg/dL — ABNORMAL LOW (ref 8.9–10.3)
Chloride: 98 mmol/L (ref 98–111)
Creatinine, Ser: 1.72 mg/dL — ABNORMAL HIGH (ref 0.61–1.24)
GFR calc Af Amer: 46 mL/min — ABNORMAL LOW (ref >60)
GFR calc non Af Amer: 40 mL/min — ABNORMAL LOW (ref >60)
Glucose, Bld: 194 mg/dL — ABNORMAL HIGH (ref 70–99)
Potassium: 3.7 mmol/L (ref 3.5–5.1)
Sodium: 137 mmol/L (ref 135–145)

## 2018-12-26 LAB — MAGNESIUM: Magnesium: 2 mg/dL (ref 1.7–2.4)

## 2018-12-26 MED ORDER — UREA 10 % EX LOTN
TOPICAL_LOTION | Freq: Two times a day (BID) | CUTANEOUS | Status: DC
  Administered 2018-12-26 – 2018-12-29 (×4): via TOPICAL
  Filled 2018-12-26 (×2): qty 237

## 2018-12-26 MED ORDER — LEVALBUTEROL HCL 0.63 MG/3ML IN NEBU
0.6300 mg | INHALATION_SOLUTION | Freq: Two times a day (BID) | RESPIRATORY_TRACT | Status: DC
  Administered 2018-12-27: 07:00:00 0.63 mg via RESPIRATORY_TRACT
  Filled 2018-12-26 (×2): qty 3

## 2018-12-26 MED ORDER — LEVALBUTEROL HCL 0.63 MG/3ML IN NEBU
0.6300 mg | INHALATION_SOLUTION | Freq: Four times a day (QID) | RESPIRATORY_TRACT | Status: DC | PRN
  Administered 2018-12-26: 0.63 mg via RESPIRATORY_TRACT

## 2018-12-26 NOTE — Consult Note (Signed)
[employer] Nurse wound consult note Patient receiving care in [facility] 5W10. Reason for Consult: BLE Wound type: No open wounds observed.  EXTENSIVE peeling skin and changes associated with chronic edema.  Dressing procedure/placement/frequency: Twice daily washing of BLE with soap and water, then application of 60% urea cream. Elevate the legs as often as possible. Thank you for the consult.  Discussed plan of care with the patient and bedside nurse.  [place] nurse will not follow at this time.  Please re-consult the [place] team if needed.  Val Riles, RN, MSN, CWOCN, CNS-BC, pager [phone]

## 2018-12-26 NOTE — Progress Notes (Addendum)
PROGRESS NOTE                                                                                                                                                                                                             Patient Demographics:    [name], is a 68 y.o. male, DOB - 1950-07-18, ZOX:[id number]  Admit date - 12/25/2018   Admitting Physician Eduard Clos, MD  Outpatient Primary MD for the patient is Clinic, Medical, MD (Inactive)  LOS - 1  Outpatient Specialists:cardiology  Chief Complaint  Patient presents with   Shortness of Breath       Brief Narrative  68 year old male with chronic combined systolic and diastolic CHF (recent echo with EF of 45-50%), diabetes mellitus, hypertension who presented with acute onset of shortness of breath and found by EMS to be hypoxic with sats in the 30s.  Wife also concerned that he may have passed out.  Patient reported having some burning sensation in his chest and feeling very weak.  Denied any fevers, chills, nausea, vomiting, abdominal pain, bowel or urinary symptoms.  In April he was hospitalized for shortness of breath and found to have pneumonia and also a PEA arrest.  He was taken off scheduled Lasix by his cardiologist and placed on PRN Lasix recently. In the ED patient was in acute hypoxic respiratory failure and septic.  Placed on BiPAP.  He was tachycardic and tachypneic with elevated lactic acid of 2.9 but afebrile.  EKG showed sinus tachycardia with LBBB.  Patient found to be in hypertensive crisis.  BNP of 172 and high sensitive troponin of 2.  COVID-19 was negative. ABG showed pH of 7.28, PCO2 47.6 and PO2 of 32.  White count of 10.5.  Chest x-ray showed large area of right-sided consolidation concerning for pneumonia along with bilateral congestion.  Patient also placed on nitroglycerin drip. Admitted for acute on chronic CHF exacerbation and right-sided  pneumonia.   Subjective:   Patient seen and examined.  Reports his breathing slowly getting better.  Maintaining sats on 4 L.  Reduced to 3 L and sats stable in the low 90s.  No overnight events.  Assessment  & Plan :    Principal Problem:   Acute respiratory failure with hypoxemia (HCC) Secondary to multifocal right-sided pneumonia (healthcare associated pneumonia) and acute on chronic systolic  CHF. Required BiPAP on admission.  On 4 L nasal cannula this morning wean as tolerated..  Discontinue vancomycin and maintain on Zosyn alone.  Will narrow antibiotic in a.m. if stable.  Blood culture not sent on admission.  CT chest done showing multifocal pneumonia.  No groundglass opacity.  COVID-19 was tested negative.  Lactic acid trending down.  CRP and procalcitonin normal. Has poor dentition.  Seen by speech and swallow nurse.  No signs of aspiration.  Concern for significant reflux.  Active Problems: Acute on chronic combined systolic and diastolic CHF (HCC) Was taken off scheduled Lasix by his cardiologist recently.  Received 80 mg IV Lasix in the ED and placed on 40 mg IV Lasix every 12 hours.  Diuresing well.  (Negative balance of 3.5 L since admission) 3.5 L since admission). has chronic leg edema.  Monitor lites.  Strict I's/O and daily weight.  Continue aspirin and statin.  ?  Syncope Could be associated with hypoxia.  Patient denies passing out.  Troponins normal.  Stable on telemetry.  Monitor potassium and magnesium.  Given history of PEA previously will benefit from 30-day outpatient Holter monitoring upon discharge.  Hypertensive crisis Required IV nitroglycerin on admission which was weaned off.  Resume home dose Coreg.  Blood pressure currently stable.  Chronic kidney disease stage III Creatinine at baseline  diabetes mellitus type 2 with hyperglycemia On metformin.  CBG in high 100 and low 200s.  Will add low-dose bedtime Lantus.  Anemia of chronic disease Stable.   Monitor.  BPH Noted to have urinary retention of almost 1 L..  Coud catheter placed.  Continue Flomax and finasteride.  Voiding trial in a.m.  Chronic lymphedema Wound care consulted.  Recommended given extensive peeling of the skin twice daily washing of the lower extremities soap and water and apply 10% urea cream, elevate legs frequently.  GERD Continue PPI  Ascending thoracic aortic aneurysm Seen on CT scan measuring 5.1 cm.  This is new as the last CT chest in the system from 09/2013 showed dilated ascending aorta measuring 4.7 cm with no aneurysm.  Recommend semiannual imaging follow-up with CTA or MRA.  Will refer to cardiothoracic surgery for outpatient follow-up.  PT evaluation.   Code Status : Full code  Family Communication  : Wife called yesterday and left a message.  Will call her again today.  Disposition Plan  : Home pending clinical improvement, possibly in the next 2-3 days.  Barriers For Discharge : Active symptoms  Consults  : None  Procedures  : CT chest  DVT Prophylaxis  :  Lovenox -   Lab Results  Component Value Date   PLT 275 12/26/2018    Antibiotics  :   Anti-infectives (From admission, onward)   Start     Dose/Rate Route Frequency Ordered Stop   12/26/18 0400  vancomycin (VANCOCIN) 1,500 mg in sodium chloride 0.9 % 500 mL IVPB     1,500 mg 250 mL/hr over 120 Minutes Intravenous Every 24 hours 12/25/18 0353     12/25/18 1200  piperacillin-tazobactam (ZOSYN) IVPB 3.375 g     3.375 g 12.5 mL/hr over 240 Minutes Intravenous Every 8 hours 12/25/18 0353     12/25/18 0345  vancomycin (VANCOCIN) 2,000 mg in sodium chloride 0.9 % 500 mL IVPB     2,000 mg 250 mL/hr over 120 Minutes Intravenous  Once 12/25/18 0344 12/25/18 0800   12/25/18 0345  piperacillin-tazobactam (ZOSYN) IVPB 3.375 g     3.375 g 100  mL/hr over 30 Minutes Intravenous  Once 12/25/18 0344 12/25/18 0444        Objective:   Vitals:   12/26/18 0708 12/26/18 0747 12/26/18 0810  12/26/18 1147  BP:  121/77  114/75  Pulse:  (!) 49 83 85  Resp:  18  16  Temp:  (!) 97.4 F (36.3 C)  (!) 97.3 F (36.3 C)  TempSrc:  Oral  Oral  SpO2: 96% 95%  96%  Weight:      Height:        Wt Readings from Last 3 Encounters:  12/25/18 104 kg  12/22/18 104.4 kg  11/19/18 104.3 kg     Intake/Output Summary (Last 24 hours) at 12/26/2018 1159 Last data filed at 12/26/2018 [id number] Gross per 24 hour  Intake 1112.9 ml  Output 5000 ml  Net -3887.1 ml    Physical exam Elderly male not in distress, fatigue HEENT: Moist mucosa, supple neck Chest: Fine crackles on the left lung, coarse crackles in diminished breath sounds on the right CVs: Normal S1-S2, no murmurs GI: Soft, nondistended nontender Musculoskeletal: Warm, chronic leg edema with peeled skin, 1+ pitting       Data Review:    CBC Recent Labs  Lab 12/25/18 0213 12/25/18 0254 12/25/18 0716 12/26/18 0220  WBC 10.5  --  14.3* 12.2*  HGB 12.3* 13.3 11.6* 11.4*  HCT 40.1 39.0 37.5* 35.5*  PLT 353  --  268 275  MCV 90.9  --  90.6 86.2  MCH 27.9  --  28.0 27.7  MCHC 30.7  --  30.9 32.1  RDW 13.9  --  14.0 13.8  LYMPHSABS 2.8  --  0.7  --   MONOABS 0.6  --  0.5  --   EOSABS 0.3  --  0.1  --   BASOSABS 0.1  --  0.1  --     Chemistries  Recent Labs  Lab 12/22/18 1149 12/25/18 0213 12/25/18 0254 12/25/18 0716 12/26/18 0220  NA 140 135 137 139 137  K 5.0 5.0 4.6 4.7 3.7  CL 100 103  --  102 98  CO2 24 19*  --  27 27  GLUCOSE 164* 311*  --  228* 194*  BUN 28* 20  --  21 25*  CREATININE 1.66* 1.60*  --  1.48* 1.72*  CALCIUM 9.0 8.6*  --  8.5* 8.5*  MG  --   --   --  1.5*  --   AST  --  23  --  20  --   ALT  --  28  --  31  --   ALKPHOS  --  137*  --  117  --   BILITOT  --  0.5  --  0.9  --    ------------------------------------------------------------------------------------------------------------------ No results for input(s): CHOL, HDL, LDLCALC, TRIG, CHOLHDL, LDLDIRECT in the last 72  hours.  Lab Results  Component Value Date   HGBA1C 8.0 (H) 10/16/2018   ------------------------------------------------------------------------------------------------------------------ Recent Labs    12/25/18 0716  TSH 2.029   ------------------------------------------------------------------------------------------------------------------ No results for input(s): VITAMINB12, FOLATE, FERRITIN, TIBC, IRON, RETICCTPCT in the last 72 hours.  Coagulation profile No results for input(s): INR, PROTIME in the last 168 hours.  No results for input(s): DDIMER in the last 72 hours.  Cardiac Enzymes No results for input(s): CKMB, TROPONINI, MYOGLOBIN in the last 168 hours.  Invalid input(s): CK ------------------------------------------------------------------------------------------------------------------    Component Value Date/Time   BNP 172.1 (H) 12/25/2018 0214    Inpatient Medications  Scheduled Meds:  aspirin EC  81 mg Oral Daily   atorvastatin  40 mg Oral QPM   carvedilol  3.125 mg Oral BID WC   enoxaparin (LOVENOX) injection  40 mg Subcutaneous Q24H   finasteride  5 mg Oral Daily   furosemide  40 mg Intravenous Q12H   gabapentin  300 mg Oral QID   insulin aspart  0-15 Units Subcutaneous TID WC   levalbuterol  0.63 mg Nebulization BID   montelukast  10 mg Oral QHS   multivitamin with minerals  1 tablet Oral Daily   ondansetron  4 mg Intravenous Once   pantoprazole  40 mg Oral Daily   tamsulosin  0.4 mg Oral Daily   urea   Topical BID   Continuous Infusions:  nitroGLYCERIN Stopped (12/25/18 0839)   piperacillin-tazobactam (ZOSYN)  IV 3.375 g (12/26/18 0321)   vancomycin 1,500 mg (12/26/18 0337)   PRN Meds:.acetaminophen **OR** acetaminophen, levalbuterol  Micro Results Recent Results (from the past 240 hour(s))  SARS Coronavirus 2 (CEPHEID- Performed in [facility] hospital lab), Hosp Order     Status: None   Collection Time: 12/25/18  2:09  AM   Specimen: Nasopharyngeal Swab  Result Value Ref Range Status   SARS Coronavirus 2 NEGATIVE NEGATIVE Final    Comment: (NOTE) If result is NEGATIVE SARS-CoV-2 target nucleic acids are NOT DETECTED. The SARS-CoV-2 RNA is generally detectable in upper and lower  respiratory specimens during the acute phase of infection. The lowest  concentration of SARS-CoV-2 viral copies this assay can detect is 250  copies / mL. A negative result does not preclude SARS-CoV-2 infection  and should not be used as the sole basis for treatment or other  patient management decisions.  A negative result may occur with  improper specimen collection / handling, submission of specimen other  than nasopharyngeal swab, presence of viral mutation(s) within the  areas targeted by this assay, and inadequate number of viral copies  (<250 copies / mL). A negative result must be combined with clinical  observations, patient history, and epidemiological information. If result is POSITIVE SARS-CoV-2 target nucleic acids are DETECTED. The SARS-CoV-2 RNA is generally detectable in upper and lower  respiratory specimens dur ing the acute phase of infection.  Positive  results are indicative of active infection with SARS-CoV-2.  Clinical  correlation with patient history and other diagnostic information is  necessary to determine patient infection status.  Positive results do  not rule out bacterial infection or co-infection with other viruses. If result is PRESUMPTIVE POSTIVE SARS-CoV-2 nucleic acids MAY BE PRESENT.   A presumptive positive result was obtained on the submitted specimen  and confirmed on repeat testing.  While 2019 novel coronavirus  (SARS-CoV-2) nucleic acids may be present in the submitted sample  additional confirmatory testing may be necessary for epidemiological  and / or clinical management purposes  to differentiate between  SARS-CoV-2 and other Sarbecovirus currently known to infect humans.   If clinically indicated additional testing with an alternate test  methodology [phone]([id number]) is advised. The SARS-CoV-2 RNA is generally  detectable in upper and lower respiratory sp ecimens during the acute  phase of infection. The expected result is Negative. Fact Sheet for Patients:  BoilerBrush.com.cyhttps://www.fda.gov/media/136312/download Fact Sheet for Healthcare Providers: https://pope.com/https://www.fda.gov/media/136313/download This test is not yet approved or cleared by the [place] FDA and has been authorized for detection and/or diagnosis of SARS-CoV-2 by FDA under an Emergency Use Authorization (EUA).  This EUA will remain in effect (meaning this  test can be used) for the duration of the COVID-19 declaration under Section 564(b)(1) of the Act, 21 U.S.C. section 360bbb-3(b)(1), unless the authorization is terminated or revoked sooner. Performed at [facility], [address] [place], [place] [address]     Radiology Reports Ct Chest Wo Contrast  Result Date: 12/25/2018 CLINICAL DATA:  Respiratory distress. EXAM: CT CHEST WITHOUT CONTRAST TECHNIQUE: Multidetector CT imaging of the chest was performed following the standard protocol without IV contrast. COMPARISON:  Radiographs of same day.  CT scan of October 11, 2013. FINDINGS: Cardiovascular: 5.1 cm ascending thoracic aortic aneurysm is noted. This is enlarged compared to prior exam. Atherosclerosis of thoracic aorta is noted. Normal cardiac size. No pericardial effusion. Mediastinum/Nodes: No enlarged mediastinal or axillary lymph nodes, although visualization is limited due the lack of intravenous contrast. Thyroid gland, trachea, and esophagus demonstrate no significant findings. Lungs/Pleura: No pneumothorax or significant pleural effusion is noted. Bilateral airspace opacities are noted, most prominently seen in the right upper and bilateral lower lobes. This is most consistent with multifocal pneumonia. Upper Abdomen: No acute abnormality.  Musculoskeletal: Old bilateral rib fractures are noted. No acute abnormality is noted. IMPRESSION: Large bilateral airspace opacities are noted, most consistent with multifocal pneumonia. 5.1 cm ascending thoracic aortic aneurysm. Recommend semi-annual imaging followup by CTA or MRA and referral to cardiothoracic surgery if not already obtained. This recommendation follows 2010 ACCF/AHA/AATS/ACR/ASA/SCA/SCAI/SIR/STS/SVM Guidelines for the Diagnosis and Management of Patients With Thoracic Aortic Disease. Circulation. 2010; 121: N829-F621: E266-e369. Aortic aneurysm NOS (ICD10-I71.9). Aortic Atherosclerosis (ICD10-I70.0). Electronically Signed   By: Lupita RaiderJames  Green Jr M.D.   On: 12/25/2018 08:36   Dg Chest Portable 1 View  Result Date: 12/25/2018 CLINICAL DATA:  Shortness of breath hypoxia and altered mental status. EXAM: PORTABLE CHEST 1 VIEW COMPARISON:  10/16/2018 FINDINGS: There are large areas of opacity within the right lung superimposed on bilateral mild interstitial pulmonary edema. There is mild cardiomegaly. No pneumothorax or pleural effusion. IMPRESSION: Large areas of consolidation in the right lung may indicate pneumonia or aspiration. Bilateral mild interstitial pulmonary edema. Electronically Signed   By: Deatra RobinsonKevin  Herman M.D.   On: 12/25/2018 02:30    Time Spent in minutes  35   Nygeria Lager M.D on 12/26/2018 at 11:59 AM  Between 7am to 7pm - Pager - [phone]  After 7pm go to [email or web address] - password [email or web address]  [facility] -  Office  [phone]

## 2018-12-26 NOTE — Progress Notes (Signed)
Attempted to call patient's spouse to update on plan of care. No answer. Will attempt later.    Hiram Comber, RN 12/26/2018 3:23 PM

## 2018-12-26 NOTE — Evaluation (Signed)
Clinical/Bedside Swallow Evaluation Patient Details  Name: [name] MRN: [id number] Date of Birth: 11/06/1950  Today's Date: 12/26/2018 Time: SLP Start Time (ACUTE ONLY): 0854 SLP Stop Time (ACUTE ONLY): 0920 SLP Time Calculation (min) (ACUTE ONLY): 26 min  Past Medical History:  Past Medical History:  Diagnosis Date  . 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 Diabetes mellitus 10/17/2011   newly dx today  . Edema leg    right leg has leaky valve and right foot swells  . Emphysema   . Excessive ear wax   . Fatty liver   . Hernia    near navel  . Hyperlipidemia   . Hypertension   . Leaky heart valve   . Neuropathy   . Rheumatic fever   . Slow urinary stream   . TIA (transient ischemic attack)    Past Surgical History:  Past Surgical History:  Procedure Laterality Date  . CIRCUMCISION    . LITHOTRIPSY     HPI:  68 year old male with chronic combined systolic and diastolic CHF (recent echo with EF of 45-50%), diabetes mellitus, hypertension who presented with acute onset of shortness of breath and found by EMS to be hypoxic with sats in the 30s.  Wife also concerned that he may have passed out.  Patient reported having some burning sensation in his chest and feeling very weak. Chest x-ray showed large area of right-sided consolidation concerning for pneumonia along with bilateral congestion.    In April he was hospitalized for shortness of breath and found to have pneumonia and also a PEA arrest. Has had prior SLP evals without finding of significant dysphagia, though there were some prior complaints of globus.    Assessment / Plan / Recommendation Clinical Impression  Pt demosntrates no indication of pharyngeal dyspahgia or signs of aspiration. He does report some symptoms of reflux and possibly night time reflux, including sleeping on his right side. We reviewed basic reflux precautions which I reiterated to wife over the phone. I  also made MD aware. No further SLP interventions needed at this time. Will sign off.  SLP Visit Diagnosis: Dysphagia, unspecified (R13.10)    Aspiration Risk  Mild aspiration risk    Diet Recommendation Regular;Thin liquid   Liquid Administration via: Cup;Straw Medication Administration: Whole meds with liquid Supervision: Patient able to self feed Compensations: Slow rate;Small sips/bites Postural Changes: Seated upright at 90 degrees;Remain upright for at least 30 minutes after po intake    Other  Recommendations Oral Care Recommendations: Oral care BID   Follow up Recommendations None      Frequency and Duration            Prognosis        Swallow Study   General HPI: 68 year old male with chronic combined systolic and diastolic CHF (recent echo with EF of 45-50%), diabetes mellitus, hypertension who presented with acute onset of shortness of breath and found by EMS to be hypoxic with sats in the 30s.  Wife also concerned that he may have passed out.  Patient reported having some burning sensation in his chest and feeling very weak. Chest x-ray showed large area of right-sided consolidation concerning for pneumonia along with bilateral congestion.    In April he was hospitalized for shortness of breath and found to have pneumonia and also a PEA arrest. Has had prior SLP evals without finding of significant dysphagia, though there were some prior complaints of globus.  Type of  Study: Bedside Swallow Evaluation Diet Prior to this Study: NPO Respiratory Status: Room air History of Recent Intubation: No Behavior/Cognition: Alert;Cooperative;Pleasant mood Oral Cavity Assessment: Within Functional Limits Oral Care Completed by SLP: No Oral Cavity - Dentition: Adequate natural dentition Vision: Functional for self-feeding Self-Feeding Abilities: Able to feed self Patient Positioning: Upright in bed Baseline Vocal Quality: Normal Volitional Cough: Strong Volitional Swallow: Able  to elicit    Oral/Motor/Sensory Function Overall Oral Motor/Sensory Function: Within functional limits   Ice Chips     Thin Liquid Thin Liquid: Within functional limits Presentation: Cup;Straw    Nectar Thick Nectar Thick Liquid: Not tested   Honey Thick Honey Thick Liquid: Not tested   Puree Puree: Not tested   Solid     Solid: Within functional limits     Herbie Baltimore, MA CCC-SLP  Acute Rehabilitation Services Pager [phone] Office [phone]  Donne Baley, Katherene Ponto 12/26/2018,9:30 AM

## 2018-12-27 LAB — BASIC METABOLIC PANEL
Anion gap: 12 (ref 5–15)
BUN: 28 mg/dL — ABNORMAL HIGH (ref 8–23)
CO2: 30 mmol/L (ref 22–32)
Calcium: 8.6 mg/dL — ABNORMAL LOW (ref 8.9–10.3)
Chloride: 95 mmol/L — ABNORMAL LOW (ref 98–111)
Creatinine, Ser: 1.62 mg/dL — ABNORMAL HIGH (ref 0.61–1.24)
GFR calc Af Amer: 49 mL/min — ABNORMAL LOW (ref >60)
GFR calc non Af Amer: 43 mL/min — ABNORMAL LOW (ref >60)
Glucose, Bld: 229 mg/dL — ABNORMAL HIGH (ref 70–99)
Potassium: 3.9 mmol/L (ref 3.5–5.1)
Sodium: 137 mmol/L (ref 135–145)

## 2018-12-27 LAB — GLUCOSE, CAPILLARY
Glucose-Capillary: 179 mg/dL — ABNORMAL HIGH (ref 70–99)
Glucose-Capillary: 366 mg/dL — ABNORMAL HIGH (ref 70–99)
Glucose-Capillary: 212 mg/dL — ABNORMAL HIGH (ref 70–99)
Glucose-Capillary: 263 mg/dL — ABNORMAL HIGH (ref 70–99)

## 2018-12-27 MED ORDER — FUROSEMIDE 10 MG/ML IJ SOLN
40.0000 mg | Freq: Every day | INTRAMUSCULAR | Status: DC
  Administered 2018-12-28 – 2018-12-29 (×2): 40 mg via INTRAVENOUS
  Filled 2018-12-27 (×2): qty 4

## 2018-12-27 MED ORDER — CYCLOBENZAPRINE HCL 5 MG PO TABS
5.0000 mg | ORAL_TABLET | Freq: Once | ORAL | Status: AC
  Administered 2018-12-28: 5 mg via ORAL
  Filled 2018-12-27: qty 1

## 2018-12-27 NOTE — Evaluation (Signed)
Physical Therapy Evaluation Patient Details Name: [name] MRN: [id number] DOB: 30-Jul-1950 Today's Date: 12/27/2018   History of Present Illness  68 year old male with chronic combined systolic and diastolic CHF (recent echo with EF of 45-50%), diabetes mellitus, hypertension who presented with acute onset of shortness of breath and found by EMS to be hypoxic with sats in the 30s.  Wife also concerned that he may have passed out    Clinical Impression  Pt admitted with above diagnosis. Pt currently with functional limitations due to the deficits listed below (see PT Problem List). PTA pt living at home with wife and grandchildren, reports independence with all mobility. Today, guard for short distance gait with RW, SpO2 WNL on 2L with short bout of activity. Feel he will be able to progress to HHPT.  Pt will benefit from skilled PT to increase their independence and safety with mobility to allow discharge to the venue listed below.       Follow Up Recommendations Home health PT;Supervision/Assistance - 24 hour    Equipment Recommendations  None recommended by PT    Recommendations for Other Services       Precautions / Restrictions Precautions Precautions: Fall Restrictions Weight Bearing Restrictions: No      Mobility  Bed Mobility               General bed mobility comments: in chair at entry  Transfers Overall transfer level: Needs assistance Equipment used: Rolling walker (2 wheeled) Transfers: Sit to/from Stand Sit to Stand: Min guard            Ambulation/Gait Ambulation/Gait assistance: Min guard Gait Distance (Feet): 30 Feet Assistive device: Rolling walker (2 wheeled) Gait Pattern/deviations: Step-to pattern Gait velocity: decreased   General Gait Details: fatigues after 30', SpO2 WNL on 2L  Stairs            Wheelchair Mobility    Modified Rankin (Stroke Patients Only)       Balance                                              Pertinent Vitals/Pain Pain Assessment: No/denies pain    Home Living Family/patient expects to be discharged to:: Private residence Living Arrangements: Alone Available Help at Discharge: Family;Available 24 hours/day Type of Home: House Home Access: Stairs to enter Entrance Stairs-Rails: None Entrance Stairs-Number of Steps: 4 Home Layout: One level Home Equipment: None      Prior Function Level of Independence: Independent               Hand Dominance        Extremity/Trunk Assessment   Upper Extremity Assessment Upper Extremity Assessment: Overall WFL for tasks assessed;Generalized weakness    Lower Extremity Assessment Lower Extremity Assessment: Overall WFL for tasks assessed;Generalized weakness       Communication   Communication: No difficulties  Cognition Arousal/Alertness: Awake/alert Behavior During Therapy: WFL for tasks assessed/performed Overall Cognitive Status: Within Functional Limits for tasks assessed                                        General Comments      Exercises     Assessment/Plan    PT Assessment Patient needs continued PT services  PT Problem List Decreased  strength       PT Treatment Interventions DME instruction;Gait training;Stair training;Functional mobility training;Therapeutic activities;Therapeutic exercise;Balance training    PT Goals (Current goals can be found in the Care Plan section)  Acute Rehab PT Goals Patient Stated Goal: go home PT Goal Formulation: With patient Potential to Achieve Goals: Good    Frequency Min 3X/week   Barriers to discharge        Co-evaluation               AM-PAC PT "6 Clicks" Mobility  Outcome Measure Help needed turning from your back to your side while in a flat bed without using bedrails?: None Help needed moving from lying on your back to sitting on the side of a flat bed without using bedrails?: None Help needed moving to  and from a bed to a chair (including a wheelchair)?: A Little Help needed standing up from a chair using your arms (e.g., wheelchair or bedside chair)?: A Little Help needed to walk in hospital room?: A Little Help needed climbing 3-5 steps with a railing? : A Lot 6 Click Score: 19    End of Session Equipment Utilized During Treatment: Gait belt Activity Tolerance: Patient tolerated treatment well Patient left: in bed Nurse Communication: Mobility status PT Visit Diagnosis: Unsteadiness on feet (R26.81)    Time: [id number] PT Time Calculation (min) (ACUTE ONLY): 25 min   Charges:   PT Evaluation $PT Eval Moderate Complexity: 1 Mod PT Treatments $Gait Training: 8-22 mins       Etta GrandchildSean Kaisa Wofford, PT, DPT Acute Rehabilitation Services Pager: [phone] Office: [phone]    Etta GrandchildSean Amarii Bordas 12/27/2018, 12:16 PM

## 2018-12-27 NOTE — Progress Notes (Signed)
Attempted to call patient's wife - Horris Latino to update on patient's plan of care. Left message with family member. Will continue to monitor.   Hiram Comber, RN 12/27/2018 2:30 PM

## 2018-12-27 NOTE — Progress Notes (Signed)
Patient oob to chair x 1 staff assistance. Patient educated about importance of elevation of lower extremities but adamantly refused to elevate while in chair. Will continue to monitor.    Hiram Comber, RN 12/27/2018 2:09 PM

## 2018-12-27 NOTE — Progress Notes (Signed)
PROGRESS NOTE                                                                                                                                                                                                             Patient Demographics:    [name], is a 68 y.o. male, DOB - 08/20/1950, ZOX:[id number]  Admit date - 12/25/2018   Admitting Physician Eduard ClosArshad N Kakrakandy, MD  Outpatient Primary MD for the patient is Clinic, Medical, MD (Inactive)  LOS - 2  Outpatient Specialists:cardiology  Chief Complaint  Patient presents with   Shortness of Breath       Brief Narrative  68 year old male with chronic combined systolic and diastolic CHF (recent echo with EF of 45-50%), diabetes mellitus, hypertension who presented with acute onset of shortness of breath and found by EMS to be hypoxic with sats in the 30s.  Wife also concerned that he may have passed out.  Patient reported having some burning sensation in his chest and feeling very weak.  Denied any fevers, chills, nausea, vomiting, abdominal pain, bowel or urinary symptoms.  In April he was hospitalized for shortness of breath and found to have pneumonia and also a PEA arrest.  He was taken off scheduled Lasix by his cardiologist and placed on PRN Lasix recently. In the ED patient was in acute hypoxic respiratory failure and septic.  Placed on BiPAP.  He was tachycardic and tachypneic with elevated lactic acid of 2.9 but afebrile.  EKG showed sinus tachycardia with LBBB.  Patient found to be in hypertensive crisis.  BNP of 172 and high sensitive troponin of 2.  COVID-19 was negative. ABG showed pH of 7.28, PCO2 47.6 and PO2 of 32.  White count of 10.5.  Chest x-ray showed large area of right-sided consolidation concerning for pneumonia along with bilateral congestion.  Patient also placed on nitroglycerin drip. Admitted for acute on chronic CHF exacerbation and right-sided  pneumonia.   Subjective:   Patient seen and examined.  Reports his breathing slowly getting better.  Maintaining sats on 4 L.  Reduced to 3 L and sats stable in the low 90s.  No overnight events.  Assessment  & Plan :    Principal Problem:   Acute respiratory failure with hypoxemia (HCC) Secondary to multifocal right-sided pneumonia (healthcare associated pneumonia) and acute on chronic systolic  CHF. Required BiPAP on admission.  Improving continuously and weaned to 2.5 L via nasal cannula this morning. Blood culture not sent on admission.  Transition to oral Rocephin and azithromycin.  COVID-19 was tested negative.   Has poor dentition.  Seen by speech and swallow nurse.  No signs of aspiration.  Concern for significant reflux.  Active Problems: Acute on chronic combined systolic and diastolic CHF (HCC) Was taken off scheduled Lasix by his cardiologist recently.  Diuresing parenteral on IV Lasix 40 mg every 12 hours with negative balance of 4.7 L since admission.  Strict I's/O and daily weight.  Continue aspirin and statin.  ?  Syncope Could be associated with hypoxia.  Patient denies passing out.  Troponins normal.  Stable on telemetry.  Monitor potassium and magnesium.  Given history of PEA previously will benefit from 30-day outpatient Holter monitoring upon discharge.  Telemetry can be discontinued.  Hypertensive crisis Required IV nitroglycerin on admission which was weaned off.  Resume home dose Coreg.  Blood pressure currently stable.  Chronic kidney disease stage III Creatinine at baseline  diabetes mellitus type 2 with hyperglycemia Metformin held.   added low-dose bedtime Lantus with improvement in CBC  Anemia of chronic disease Stable.  Monitor.  BPH Noted to have urinary retention of almost 1 L on admission coud catheter placed.  Continue Flomax and finasteride.  Voiding trial this morning.  Chronic lymphedema Wound care consulted.  Recommended given extensive  peeling of the skin twice daily washing of the lower extremities soap and water and apply 10% urea cream, elevate legs frequently. Exam significantly improved.  GERD Continue PPI  Ascending thoracic aortic aneurysm Seen on CT scan measuring 5.1 cm.  This is new as the last CT chest in the system from 09/2013 showed dilated ascending aorta measuring 4.7 cm with no aneurysm.  Recommend semiannual imaging follow-up with CTA or MRA.  Will refer to cardiothoracic surgery for outpatient follow-up.  Continue recommends home health   Code Status : Full code  Family Communication  : None: I will call his wife again to update  Disposition Plan  : Home possibly in the next 24-48 hours if continues to improve  Barriers For Discharge : Active symptoms  Consults  : None  Procedures  : CT chest  DVT Prophylaxis  :  Lovenox -   Lab Results  Component Value Date   PLT 275 12/26/2018    Antibiotics  :   Anti-infectives (From admission, onward)   Start     Dose/Rate Route Frequency Ordered Stop   12/26/18 0400  vancomycin (VANCOCIN) 1,500 mg in sodium chloride 0.9 % 500 mL IVPB  Status:  Discontinued     1,500 mg 250 mL/hr over 120 Minutes Intravenous Every 24 hours 12/25/18 0353 12/26/18 1250   12/25/18 1200  piperacillin-tazobactam (ZOSYN) IVPB 3.375 g     3.375 g 12.5 mL/hr over 240 Minutes Intravenous Every 8 hours 12/25/18 0353     12/25/18 0345  vancomycin (VANCOCIN) 2,000 mg in sodium chloride 0.9 % 500 mL IVPB     2,000 mg 250 mL/hr over 120 Minutes Intravenous  Once 12/25/18 0344 12/25/18 0800   12/25/18 0345  piperacillin-tazobactam (ZOSYN) IVPB 3.375 g     3.375 g 100 mL/hr over 30 Minutes Intravenous  Once 12/25/18 0344 12/25/18 0444        Objective:   Vitals:   12/27/18 0707 12/27/18 0759 12/27/18 0814 12/27/18 1202  BP:  140/79 136/83 121/69  Pulse:  83 87 87  Resp:  (!) 21 (!) 22 (!) 22  Temp:  98.2 F (36.8 C)  98.6 F (37 C)  TempSrc:  Oral  Oral  SpO2: 96%  97% 100% 100%  Weight:      Height:        Wt Readings from Last 3 Encounters:  12/25/18 104 kg  12/22/18 104.4 kg  11/19/18 104.3 kg     Intake/Output Summary (Last 24 hours) at 12/27/2018 1357 Last data filed at 12/27/2018 1215 Gross per 24 hour  Intake 167.56 ml  Output 1300 ml  Net -1132.44 ml   Physical exam Not in distress HEENT unremarkable, supple neck Chest: Improved bibasilar breath sound CVs: Normal S1-S2 GI: Soft, nondistended, nontender Musculoskeletal: Chronic leg edema improved with trace pitting        Data Review:    CBC Recent Labs  Lab 12/25/18 0213 12/25/18 0254 12/25/18 0716 12/26/18 0220  WBC 10.5  --  14.3* 12.2*  HGB 12.3* 13.3 11.6* 11.4*  HCT 40.1 39.0 37.5* 35.5*  PLT 353  --  268 275  MCV 90.9  --  90.6 86.2  MCH 27.9  --  28.0 27.7  MCHC 30.7  --  30.9 32.1  RDW 13.9  --  14.0 13.8  LYMPHSABS 2.8  --  0.7  --   MONOABS 0.6  --  0.5  --   EOSABS 0.3  --  0.1  --   BASOSABS 0.1  --  0.1  --     Chemistries  Recent Labs  Lab 12/22/18 1149 12/25/18 0213 12/25/18 0254 12/25/18 0716 12/26/18 0220 12/27/18 0255  NA 140 135 137 139 137 137  K 5.0 5.0 4.6 4.7 3.7 3.9  CL 100 103  --  102 98 95*  CO2 24 19*  --  27 27 30   GLUCOSE 164* 311*  --  228* 194* 229*  BUN 28* 20  --  21 25* 28*  CREATININE 1.66* 1.60*  --  1.48* 1.72* 1.62*  CALCIUM 9.0 8.6*  --  8.5* 8.5* 8.6*  MG  --   --   --  1.5* 2.0  --   AST  --  23  --  20  --   --   ALT  --  28  --  31  --   --   ALKPHOS  --  137*  --  117  --   --   BILITOT  --  0.5  --  0.9  --   --    ------------------------------------------------------------------------------------------------------------------ No results for input(s): CHOL, HDL, LDLCALC, TRIG, CHOLHDL, LDLDIRECT in the last 72 hours.  Lab Results  Component Value Date   HGBA1C 8.0 (H) 10/16/2018    ------------------------------------------------------------------------------------------------------------------ Recent Labs    12/25/18 0716  TSH 2.029   ------------------------------------------------------------------------------------------------------------------ No results for input(s): VITAMINB12, FOLATE, FERRITIN, TIBC, IRON, RETICCTPCT in the last 72 hours.  Coagulation profile No results for input(s): INR, PROTIME in the last 168 hours.  No results for input(s): DDIMER in the last 72 hours.  Cardiac Enzymes No results for input(s): CKMB, TROPONINI, MYOGLOBIN in the last 168 hours.  Invalid input(s): CK ------------------------------------------------------------------------------------------------------------------    Component Value Date/Time   BNP 172.1 (H) 12/25/2018 0214    Inpatient Medications  Scheduled Meds:  aspirin EC  81 mg Oral Daily   atorvastatin  40 mg Oral QPM   carvedilol  3.125 mg Oral BID WC   enoxaparin (LOVENOX) injection  40 mg Subcutaneous  Q24H   finasteride  5 mg Oral Daily   furosemide  40 mg Intravenous Q12H   gabapentin  300 mg Oral QID   insulin aspart  0-15 Units Subcutaneous TID WC   montelukast  10 mg Oral QHS   multivitamin with minerals  1 tablet Oral Daily   pantoprazole  40 mg Oral Daily   tamsulosin  0.4 mg Oral Daily   urea   Topical BID   Continuous Infusions:  nitroGLYCERIN Stopped (12/25/18 0839)   piperacillin-tazobactam (ZOSYN)  IV 3.375 g (12/27/18 1212)   PRN Meds:.acetaminophen **OR** acetaminophen, levalbuterol  Micro Results Recent Results (from the past 240 hour(s))  SARS Coronavirus 2 (CEPHEID- Performed in [facility] lab), Hosp Order     Status: None   Collection Time: 12/25/18  2:09 AM   Specimen: Nasopharyngeal Swab  Result Value Ref Range Status   SARS Coronavirus 2 NEGATIVE NEGATIVE Final    Comment: (NOTE) If result is NEGATIVE SARS-CoV-2 target nucleic acids are  NOT DETECTED. The SARS-CoV-2 RNA is generally detectable in upper and lower  respiratory specimens during the acute phase of infection. The lowest  concentration of SARS-CoV-2 viral copies this assay can detect is 250  copies / mL. A negative result does not preclude SARS-CoV-2 infection  and should not be used as the sole basis for treatment or other  patient management decisions.  A negative result may occur with  improper specimen collection / handling, submission of specimen other  than nasopharyngeal swab, presence of viral mutation(s) within the  areas targeted by this assay, and inadequate number of viral copies  (<250 copies / mL). A negative result must be combined with clinical  observations, patient history, and epidemiological information. If result is POSITIVE SARS-CoV-2 target nucleic acids are DETECTED. The SARS-CoV-2 RNA is generally detectable in upper and lower  respiratory specimens dur ing the acute phase of infection.  Positive  results are indicative of active infection with SARS-CoV-2.  Clinical  correlation with patient history and other diagnostic information is  necessary to determine patient infection status.  Positive results do  not rule out bacterial infection or co-infection with other viruses. If result is PRESUMPTIVE POSTIVE SARS-CoV-2 nucleic acids MAY BE PRESENT.   A presumptive positive result was obtained on the submitted specimen  and confirmed on repeat testing.  While 2019 novel coronavirus  (SARS-CoV-2) nucleic acids may be present in the submitted sample  additional confirmatory testing may be necessary for epidemiological  and / or clinical management purposes  to differentiate between  SARS-CoV-2 and other Sarbecovirus currently known to infect humans.  If clinically indicated additional testing with an alternate test  methodology [phone]([id number]) is advised. The SARS-CoV-2 RNA is generally  detectable in upper and lower respiratory sp ecimens  during the acute  phase of infection. The expected result is Negative. Fact Sheet for Patients:  BoilerBrush.com.cyhttps://www.fda.gov/media/136312/download Fact Sheet for Healthcare Providers: https://pope.com/https://www.fda.gov/media/136313/download This test is not yet approved or cleared by the [place] FDA and has been authorized for detection and/or diagnosis of SARS-CoV-2 by FDA under an Emergency Use Authorization (EUA).  This EUA will remain in effect (meaning this test can be used) for the duration of the COVID-19 declaration under Section 564(b)(1) of the Act, 21 U.S.C. section 360bbb-3(b)(1), unless the authorization is terminated or revoked sooner. Performed at [facility], [address] [place], [place] [address]     Radiology Reports Ct Chest Wo Contrast  Result Date: 12/25/2018 CLINICAL DATA:  Respiratory distress. EXAM: CT CHEST WITHOUT CONTRAST TECHNIQUE: Multidetector CT imaging of the chest was performed following the standard protocol without IV contrast. COMPARISON:  Radiographs of same day.  CT scan of October 11, 2013. FINDINGS: Cardiovascular: 5.1 cm ascending thoracic aortic aneurysm is noted. This is enlarged compared to prior exam. Atherosclerosis of thoracic aorta is noted. Normal cardiac size. No pericardial effusion. Mediastinum/Nodes: No enlarged mediastinal or axillary lymph nodes, although visualization is limited due the lack of intravenous contrast. Thyroid gland, trachea, and esophagus demonstrate no significant findings. Lungs/Pleura: No pneumothorax or significant pleural effusion is noted. Bilateral airspace opacities are noted, most prominently seen in the right upper and bilateral lower lobes. This is most consistent with multifocal pneumonia. Upper Abdomen: No acute abnormality. Musculoskeletal: Old bilateral rib fractures are noted. No acute abnormality is noted. IMPRESSION: Large bilateral airspace opacities are noted, most consistent with multifocal pneumonia. 5.1 cm  ascending thoracic aortic aneurysm. Recommend semi-annual imaging followup by CTA or MRA and referral to cardiothoracic surgery if not already obtained. This recommendation follows 2010 ACCF/AHA/AATS/ACR/ASA/SCA/SCAI/SIR/STS/SVM Guidelines for the Diagnosis and Management of Patients With Thoracic Aortic Disease. Circulation. 2010; 121: H219-X588. Aortic aneurysm NOS (ICD10-I71.9). Aortic Atherosclerosis (ICD10-I70.0). Electronically Signed   By: Marijo Conception M.D.   On: 12/25/2018 08:36   Dg Chest Portable 1 View  Result Date: 12/25/2018 CLINICAL DATA:  Shortness of breath hypoxia and altered mental status. EXAM: PORTABLE CHEST 1 VIEW COMPARISON:  10/16/2018 FINDINGS: There are large areas of opacity within the right lung superimposed on bilateral mild interstitial pulmonary edema. There is mild cardiomegaly. No pneumothorax or pleural effusion. IMPRESSION: Large areas of consolidation in the right lung may indicate pneumonia or aspiration. Bilateral mild interstitial pulmonary edema. Electronically Signed   By: Ulyses Jarred M.D.   On: 12/25/2018 02:30    Time Spent in minutes  35   Maripat Borba M.D on 12/27/2018 at 1:57 PM  Between 7am to 7pm - Pager - [phone]  After 7pm go to [email or web address] - password [facility]  [facility] -  Office  [phone]

## 2018-12-27 NOTE — Progress Notes (Signed)
Catheter removed per MD order. Patient educated to utilize urinal when needed to void. Will continue to monitor.   Hiram Comber, RN 12/27/2018 12:17 PM

## 2018-12-28 LAB — GLUCOSE, CAPILLARY
Glucose-Capillary: 184 mg/dL — ABNORMAL HIGH (ref 70–99)
Glucose-Capillary: 354 mg/dL — ABNORMAL HIGH (ref 70–99)
Glucose-Capillary: 182 mg/dL — ABNORMAL HIGH (ref 70–99)
Glucose-Capillary: 267 mg/dL — ABNORMAL HIGH (ref 70–99)

## 2018-12-28 MED ORDER — AZITHROMYCIN 500 MG PO TABS
500.0000 mg | ORAL_TABLET | Freq: Every day | ORAL | Status: DC
  Administered 2018-12-28 – 2018-12-29 (×2): 500 mg via ORAL
  Filled 2018-12-28 (×2): qty 1

## 2018-12-28 MED ORDER — SODIUM CHLORIDE 0.9 % IV SOLN
1.0000 g | INTRAVENOUS | Status: DC
  Administered 2018-12-28 – 2018-12-29 (×2): 1 g via INTRAVENOUS
  Filled 2018-12-28 (×2): qty 10

## 2018-12-28 NOTE — Progress Notes (Signed)
Upon placing patient on RA, patient's oxygen saturations drop to 84-86%. 0.5L [place] re-applied. Will continue to monitor.   Hiram Comber, RN 12/28/2018 1:24 PM

## 2018-12-28 NOTE — Progress Notes (Signed)
Physical Therapy Treatment Patient Details Name: [name] MRN: [id number] DOB: [birth year] Today's Date: 12/28/2018    History of Present Illness 68 year old male with chronic combined systolic and diastolic CHF (recent echo with EF of 45-50%), diabetes mellitus, hypertension who presented with acute onset of shortness of breath and found by EMS to be hypoxic with sats in the 30s.  Wife also concerned that he may have passed out    PT Comments    Patient progressing well with therapy today, increasing ambulation distance and level of independence with RW. sats well on .5 L O2 at rest, 2L with activity at this time. No DOE or LOB ambulating. Cont to rec HHPT.      Follow Up Recommendations  Home health PT;Supervision/Assistance - 24 hour     Equipment Recommendations  None recommended by PT    Recommendations for Other Services       Precautions / Restrictions Precautions Precautions: Fall Restrictions Weight Bearing Restrictions: No    Mobility  Bed Mobility               General bed mobility comments: in chair at entry  Transfers Overall transfer level: Needs assistance Equipment used: Rolling walker (2 wheeled) Transfers: Sit to/from Stand Sit to Stand: Min guard            Ambulation/Gait Ambulation/Gait assistance: Min guard   Assistive device: Rolling walker (2 wheeled) Gait Pattern/deviations: Step-to pattern Gait velocity: decreased   General Gait Details: fatigues after 60', SpO2 WNL on 2L   Stairs             Wheelchair Mobility    Modified Rankin (Stroke Patients Only)       Balance                                            Cognition Arousal/Alertness: Awake/alert Behavior During Therapy: WFL for tasks assessed/performed Overall Cognitive Status: Within Functional Limits for tasks assessed                                        Exercises      General Comments         Pertinent Vitals/Pain      Home Living                      Prior Function            PT Goals (current goals can now be found in the care plan section) Acute Rehab PT Goals Patient Stated Goal: go home PT Goal Formulation: With patient Potential to Achieve Goals: Good    Frequency    Min 3X/week      PT Plan      Co-evaluation              AM-PAC PT "6 Clicks" Mobility   Outcome Measure  Help needed turning from your back to your side while in a flat bed without using bedrails?: None Help needed moving from lying on your back to sitting on the side of a flat bed without using bedrails?: None Help needed moving to and from a bed to a chair (including a wheelchair)?: A Little Help needed standing up from a chair using your arms (e.g., wheelchair or bedside chair)?:  A Little Help needed to walk in hospital room?: A Little Help needed climbing 3-5 steps with a railing? : A Lot 6 Click Score: 19    End of Session Equipment Utilized During Treatment: Gait belt Activity Tolerance: Patient tolerated treatment well Patient left: in bed Nurse Communication: Mobility status PT Visit Diagnosis: Unsteadiness on feet (R26.81)     Time: [id number] PT Time Calculation (min) (ACUTE ONLY): 22 min  Charges:  $Gait Training: 8-22 mins                     Etta GrandchildSean Gaurav Baldree, PT, DPT Acute Rehabilitation Services Pager: [phone] Office: [phone]     Etta GrandchildSean [name] 12/28/2018, 11:05 AM

## 2018-12-28 NOTE — Progress Notes (Signed)
PROGRESS NOTE                                                                                                                                                                                                             Patient Demographics:    [name], is a 68 y.o. male, DOB - May 30, 1951, WJX:[id number]  Admit date - 12/25/2018   Admitting Physician Eduard Clos, MD  Outpatient Primary MD for the patient is Clinic, Medical, MD (Inactive)  LOS - 3  Outpatient Specialists:cardiology  Chief Complaint  Patient presents with   Shortness of Breath       Brief Narrative  68 year old male with chronic combined systolic and diastolic CHF (recent echo with EF of 45-50%), diabetes mellitus, hypertension who presented with acute onset of shortness of breath and found by EMS to be hypoxic with sats in the 30s.  Wife also concerned that he may have passed out.  Patient reported having some burning sensation in his chest and feeling very weak.  Denied any fevers, chills, nausea, vomiting, abdominal pain, bowel or urinary symptoms.  In April he was hospitalized for shortness of breath and found to have pneumonia and also a PEA arrest.  He was taken off scheduled Lasix by his cardiologist and placed on PRN Lasix recently. In the ED patient was in acute hypoxic respiratory failure and septic.  Placed on BiPAP.  He was tachycardic and tachypneic with elevated lactic acid of 2.9 but afebrile.  EKG showed sinus tachycardia with LBBB.  Patient found to be in hypertensive crisis.  BNP of 172 and high sensitive troponin of 2.  COVID-19 was negative. ABG showed pH of 7.28, PCO2 47.6 and PO2 of 32.  White count of 10.5.  Chest x-ray showed large area of right-sided consolidation concerning for pneumonia along with bilateral congestion.  Patient also placed on nitroglycerin drip. Admitted for acute on chronic CHF exacerbation and right-sided  pneumonia.   Subjective:   Seen and examined.  In good mood.  Breathing slowly improving.  Assessment  & Plan :    Principal Problem:   Acute respiratory failure with hypoxemia (HCC) Secondary to multifocal right-sided pneumonia (healthcare associated pneumonia) and acute on chronic systolic CHF. Required BiPAP on admission.  Slowly improving and now weaned to 2 L via nasal cannula.  Will assess home O2  need Blood culture not sent on admission.  Transition to oral Rocephin and azithromycin.  COVID-19 was tested negative.   Has poor dentition.  Seen by speech and swallow nurse.  No signs of aspiration.  Concern for significant reflux.  Active Problems: Acute on chronic combined systolic and diastolic CHF (HCC) Was taken off scheduled Lasix by his cardiologist recently.  Diuresing parenteral on IV Lasix 40 mg every 12 hours with negative balance of 4 L since admission.    Strict I's/O and daily weight.  Continue aspirin and statin.  ?  Syncope Could be associated with hypoxia.  Patient denies passing out but wife informs on the phone that he was unresponsive for several seconds.  Troponins normal.  Stable on telemetry.  Electrolytes stable.  Given history of PEA previously will benefit from 30-day outpatient Holter monitoring upon discharge.  Off telemetry.  Hypertensive crisis Required IV nitroglycerin on admission which was weaned off.  Home dose Coreg resumed.  Blood pressure stable.  Chronic kidney disease stage III Creatinine at baseline  diabetes mellitus type 2 with hyperglycemia Metformin held.   added low-dose bedtime Lantus with improvement CBG.  Anemia of chronic disease Stable.  Monitor.  BPH Noted to have urinary retention of almost 1 L on admission coud catheter placed.  Continue Flomax and finasteride.  Catheter removed and voiding without difficulty.  Chronic lymphedema Wound care consulted.  Recommended given extensive peeling of the skin twice daily washing of  the lower extremities soap and water and apply 10% urea cream, elevate legs frequently. Improved on exam.  GERD Continue PPI  Ascending thoracic aortic aneurysm Seen on CT scan measuring 5.1 cm.  This is new as the last CT chest in the system from 09/2013 showed dilated ascending aorta measuring 4.7 cm with no aneurysm.  Recommend semiannual imaging follow-up with CTA or MRA.  Will refer to cardiothoracic surgery for outpatient follow-up.  Seen by PT who recommends home health.   Code Status : Full code  Family Communication  : Discussed with wife on the phone [place]/27  Disposition Plan  : Home possibly in a.m. if continues to diurese well  Barriers For Discharge : Active symptoms  Consults  : None  Procedures  : CT chest  DVT Prophylaxis  :  Lovenox -   Lab Results  Component Value Date   PLT 275 12/26/2018    Antibiotics  :   Anti-infectives (From admission, onward)   Start     Dose/Rate Route Frequency Ordered Stop   12/26/18 0400  vancomycin (VANCOCIN) 1,500 mg in sodium chloride 0.9 % 500 mL IVPB  Status:  Discontinued     1,500 mg 250 mL/hr over 120 Minutes Intravenous Every 24 hours 12/25/18 0353 12/26/18 1250   12/25/18 1200  piperacillin-tazobactam (ZOSYN) IVPB 3.375 g     3.375 g 12.5 mL/hr over 240 Minutes Intravenous Every 8 hours 12/25/18 0353     12/25/18 0345  vancomycin (VANCOCIN) 2,000 mg in sodium chloride 0.9 % 500 mL IVPB     2,000 mg 250 mL/hr over 120 Minutes Intravenous  Once 12/25/18 0344 12/25/18 0800   12/25/18 0345  piperacillin-tazobactam (ZOSYN) IVPB 3.375 g     3.375 g 100 mL/hr over 30 Minutes Intravenous  Once 12/25/18 0344 12/25/18 0444        Objective:   Vitals:   12/28/18 0003 12/28/18 0404 12/28/18 0809 12/28/18 1156  BP: 138/72 (!) 149/96 125/85 112/72  Pulse: 78 87 78 75  Resp: 18  18  (!) 22  Temp: (!) 97.4 F (36.3 C) 98.3 F (36.8 C)  98.3 F (36.8 C)  TempSrc: Oral Oral  Oral  SpO2: 94% 91% 94% 97%  Weight:        Height:        Wt Readings from Last 3 Encounters:  12/25/18 104 kg  12/22/18 104.4 kg  11/19/18 104.3 kg     Intake/Output Summary (Last 24 hours) at 12/28/2018 1221 Last data filed at 12/28/2018 0853 Gross per 24 hour  Intake 789.49 ml  Output 850 ml  Net -60.51 ml   Physical exam Fatigued, not in distress HEENT: Obese, supple neck Chest: Improved breath sounds bilaterally CVs: Normal S1-S2 GI: Soft, nondistended, nontender Musculoskeletal: Warm, improved leg edema        Data Review:    CBC Recent Labs  Lab 12/25/18 0213 12/25/18 0254 12/25/18 0716 12/26/18 0220  WBC 10.5  --  14.3* 12.2*  HGB 12.3* 13.3 11.6* 11.4*  HCT 40.1 39.0 37.5* 35.5*  PLT 353  --  268 275  MCV 90.9  --  90.6 86.2  MCH 27.9  --  28.0 27.7  MCHC 30.7  --  30.9 32.1  RDW 13.9  --  14.0 13.8  LYMPHSABS 2.8  --  0.7  --   MONOABS 0.6  --  0.5  --   EOSABS 0.3  --  0.1  --   BASOSABS 0.1  --  0.1  --     Chemistries  Recent Labs  Lab 12/22/18 1149 12/25/18 0213 12/25/18 0254 12/25/18 0716 12/26/18 0220 12/27/18 0255  NA 140 135 137 139 137 137  K 5.0 5.0 4.6 4.7 3.7 3.9  CL 100 103  --  102 98 95*  CO2 24 19*  --  27 27 30   GLUCOSE 164* 311*  --  228* 194* 229*  BUN 28* 20  --  21 25* 28*  CREATININE 1.66* 1.60*  --  1.48* 1.72* 1.62*  CALCIUM 9.0 8.6*  --  8.5* 8.5* 8.6*  MG  --   --   --  1.5* 2.0  --   AST  --  23  --  20  --   --   ALT  --  28  --  31  --   --   ALKPHOS  --  137*  --  117  --   --   BILITOT  --  0.5  --  0.9  --   --    ------------------------------------------------------------------------------------------------------------------ No results for input(s): CHOL, HDL, LDLCALC, TRIG, CHOLHDL, LDLDIRECT in the last 72 hours.  Lab Results  Component Value Date   HGBA1C 8.0 (H) 10/16/2018   ------------------------------------------------------------------------------------------------------------------ No results for input(s): TSH, T4TOTAL,  T3FREE, THYROIDAB in the last 72 hours.  Invalid input(s): FREET3 ------------------------------------------------------------------------------------------------------------------ No results for input(s): VITAMINB12, FOLATE, FERRITIN, TIBC, IRON, RETICCTPCT in the last 72 hours.  Coagulation profile No results for input(s): INR, PROTIME in the last 168 hours.  No results for input(s): DDIMER in the last 72 hours.  Cardiac Enzymes No results for input(s): CKMB, TROPONINI, MYOGLOBIN in the last 168 hours.  Invalid input(s): CK ------------------------------------------------------------------------------------------------------------------    Component Value Date/Time   BNP 172.1 (H) 12/25/2018 0214    Inpatient Medications  Scheduled Meds:  aspirin EC  81 mg Oral Daily   atorvastatin  40 mg Oral QPM   carvedilol  3.125 mg Oral BID WC   enoxaparin (LOVENOX) injection  40 mg Subcutaneous  Q24H   finasteride  5 mg Oral Daily   furosemide  40 mg Intravenous Daily   gabapentin  300 mg Oral QID   insulin aspart  0-15 Units Subcutaneous TID WC   montelukast  10 mg Oral QHS   multivitamin with minerals  1 tablet Oral Daily   pantoprazole  40 mg Oral Daily   tamsulosin  0.4 mg Oral Daily   urea   Topical BID   Continuous Infusions:  nitroGLYCERIN Stopped (12/25/18 0839)   piperacillin-tazobactam (ZOSYN)  IV 3.375 g (12/28/18 1207)   PRN Meds:.acetaminophen **OR** acetaminophen, levalbuterol  Micro Results Recent Results (from the past 240 hour(s))  SARS Coronavirus 2 (CEPHEID- Performed in [place] hospital lab), Hosp Order     Status: None   Collection Time: 12/25/18  2:09 AM   Specimen: Nasopharyngeal Swab  Result Value Ref Range Status   SARS Coronavirus 2 NEGATIVE NEGATIVE Final    Comment: (NOTE) If result is NEGATIVE SARS-CoV-2 target nucleic acids are NOT DETECTED. The SARS-CoV-2 RNA is generally detectable in upper and lower  respiratory  specimens during the acute phase of infection. The lowest  concentration of SARS-CoV-2 viral copies this assay can detect is 250  copies / mL. A negative result does not preclude SARS-CoV-2 infection  and should not be used as the sole basis for treatment or other  patient management decisions.  A negative result may occur with  improper specimen collection / handling, submission of specimen other  than nasopharyngeal swab, presence of viral mutation(s) within the  areas targeted by this assay, and inadequate number of viral copies  (<250 copies / mL). A negative result must be combined with clinical  observations, patient history, and epidemiological information. If result is POSITIVE SARS-CoV-2 target nucleic acids are DETECTED. The SARS-CoV-2 RNA is generally detectable in upper and lower  respiratory specimens dur ing the acute phase of infection.  Positive  results are indicative of active infection with SARS-CoV-2.  Clinical  correlation with patient history and other diagnostic information is  necessary to determine patient infection status.  Positive results do  not rule out bacterial infection or co-infection with other viruses. If result is PRESUMPTIVE POSTIVE SARS-CoV-2 nucleic acids MAY BE PRESENT.   A presumptive positive result was obtained on the submitted specimen  and confirmed on repeat testing.  While 2019 novel coronavirus  (SARS-CoV-2) nucleic acids may be present in the submitted sample  additional confirmatory testing may be necessary for epidemiological  and / or clinical management purposes  to differentiate between  SARS-CoV-2 and other Sarbecovirus currently known to infect humans.  If clinically indicated additional testing with an alternate test  methodology [phone]) is advised. The SARS-CoV-2 RNA is generally  detectable in upper and lower respiratory sp ecimens during the acute  phase of infection. The expected result is Negative. Fact Sheet for  Patients:  StrictlyIdeas.no Fact Sheet for Healthcare Providers: BankingDealers.co.za This test is not yet approved or cleared by the [place] FDA and has been authorized for detection and/or diagnosis of SARS-CoV-2 by FDA under an Emergency Use Authorization (EUA).  This EUA will remain in effect (meaning this test can be used) for the duration of the COVID-19 declaration under Section 564(b)(1) of the Act, 21 U.S.C. section 360bbb-3(b)(1), unless the authorization is terminated or revoked sooner. Performed at [facility], [place] [address] [place], [place] [address]     Radiology Reports Ct Chest Wo Contrast  Result Date: 12/25/2018 CLINICAL DATA:  Respiratory distress. EXAM: CT CHEST WITHOUT CONTRAST TECHNIQUE: Multidetector CT imaging of the chest was performed following the standard protocol without IV contrast. COMPARISON:  Radiographs of same day.  CT scan of October 11, 2013. FINDINGS: Cardiovascular: 5.1 cm ascending thoracic aortic aneurysm is noted. This is enlarged compared to prior exam. Atherosclerosis of thoracic aorta is noted. Normal cardiac size. No pericardial effusion. Mediastinum/Nodes: No enlarged mediastinal or axillary lymph nodes, although visualization is limited due the lack of intravenous contrast. Thyroid gland, trachea, and esophagus demonstrate no significant findings. Lungs/Pleura: No pneumothorax or significant pleural effusion is noted. Bilateral airspace opacities are noted, most prominently seen in the right upper and bilateral lower lobes. This is most consistent with multifocal pneumonia. Upper Abdomen: No acute abnormality. Musculoskeletal: Old bilateral rib fractures are noted. No acute abnormality is noted. IMPRESSION: Large bilateral airspace opacities are noted, most consistent with multifocal pneumonia. 5.1 cm ascending thoracic aortic aneurysm. Recommend semi-annual imaging followup by CTA or MRA and  referral to cardiothoracic surgery if not already obtained. This recommendation follows 2010 ACCF/AHA/AATS/ACR/ASA/SCA/SCAI/SIR/STS/SVM Guidelines for the Diagnosis and Management of Patients With Thoracic Aortic Disease. Circulation. 2010; 121: W098-J191: [id number]. Aortic aneurysm NOS (ICD10-I71.9). Aortic Atherosclerosis (ICD10-I70.0). Electronically Signed   By: Lupita RaiderJames  Green Jr M.D.   On: 12/25/2018 08:36   Dg Chest Portable 1 View  Result Date: 12/25/2018 CLINICAL DATA:  Shortness of breath hypoxia and altered mental status. EXAM: PORTABLE CHEST 1 VIEW COMPARISON:  10/16/2018 FINDINGS: There are large areas of opacity within the right lung superimposed on bilateral mild interstitial pulmonary edema. There is mild cardiomegaly. No pneumothorax or pleural effusion. IMPRESSION: Large areas of consolidation in the right lung may indicate pneumonia or aspiration. Bilateral mild interstitial pulmonary edema. Electronically Signed   By: Deatra RobinsonKevin  Herman M.D.   On: 12/25/2018 02:30    Time Spent in minutes  35   Eyanna Mcgonagle M.D on 12/28/2018 at 12:21 PM  Between 7am to 7pm - Pager - [phone]  After 7pm go to [email or web address] - password [email or web address]  [facility] -  Office  [phone]

## 2018-12-29 ENCOUNTER — Other Ambulatory Visit: Payer: Self-pay | Admitting: Cardiology

## 2018-12-29 DIAGNOSIS — J181 Lobar pneumonia, unspecified organism: Secondary | ICD-10-CM | POA: Diagnosis present

## 2018-12-29 DIAGNOSIS — I712 Thoracic aortic aneurysm, without rupture, unspecified: Secondary | ICD-10-CM | POA: Diagnosis present

## 2018-12-29 DIAGNOSIS — R55 Syncope and collapse: Secondary | ICD-10-CM

## 2018-12-29 DIAGNOSIS — R338 Other retention of urine: Secondary | ICD-10-CM

## 2018-12-29 DIAGNOSIS — E119 Type 2 diabetes mellitus without complications: Secondary | ICD-10-CM

## 2018-12-29 LAB — BASIC METABOLIC PANEL
Anion gap: 10 (ref 5–15)
BUN: 28 mg/dL — ABNORMAL HIGH (ref 8–23)
CO2: 31 mmol/L (ref 22–32)
Calcium: 9 mg/dL (ref 8.9–10.3)
Chloride: 98 mmol/L (ref 98–111)
Creatinine, Ser: 1.33 mg/dL — ABNORMAL HIGH (ref 0.61–1.24)
GFR calc Af Amer: >60 mL/min (ref >60)
GFR calc non Af Amer: 54 mL/min — ABNORMAL LOW (ref >60)
Glucose, Bld: 220 mg/dL — ABNORMAL HIGH (ref 70–99)
Potassium: 3.8 mmol/L (ref 3.5–5.1)
Sodium: 139 mmol/L (ref 135–145)

## 2018-12-29 LAB — GLUCOSE, CAPILLARY
Glucose-Capillary: 211 mg/dL — ABNORMAL HIGH (ref 70–99)
Glucose-Capillary: 347 mg/dL — ABNORMAL HIGH (ref 70–99)

## 2018-12-29 MED ORDER — FUROSEMIDE 40 MG PO TABS: 40.0000 mg | ORAL_TABLET | Freq: Every day | ORAL | 3 refills | Status: DC

## 2018-12-29 NOTE — Progress Notes (Signed)
[name] to be D/C'd home per MD order. Discussed with the patient and all questions fully answered.    Pt has cellulitis to bilateral lower extremities and ecchymosis to buttock and groin otherwise skin clean, dry and intact without evidence of skin break down, no evidence of skin tears noted.  IV catheter discontinued intact. Site without signs and symptoms of complications. Dressing and pressure applied.  An After Visit Summary was printed and given to the patient.  Patient escorted via [place], and D/C home via private auto.  [place]  12/29/2018 2:27 PM

## 2018-12-29 NOTE — Discharge Instructions (Signed)
Community-Acquired Pneumonia, Adult °Pneumonia is an infection of the lungs. It causes swelling in the airways of the lungs. Mucus and fluid may also build up inside the airways. °One type of pneumonia can happen while a person is in a hospital. A different type can happen when a person is not in a hospital (community-acquired pneumonia).  °What are the causes? ° °This condition is caused by germs (viruses, bacteria, or fungi). Some types of germs can be passed from one person to another. This can happen when you breathe in droplets from the cough or sneeze of an infected person. °What increases the risk? °You are more likely to develop this condition if you: °· Have a long-term (chronic) disease, such as: °? Chronic obstructive pulmonary disease (COPD). °? Asthma. °? Cystic fibrosis. °? Congestive heart failure. °? Diabetes. °? Kidney disease. °· Have HIV. °· Have sickle cell disease. °· Have had your spleen removed. °· Do not take good care of your teeth and mouth (poor dental hygiene). °· Have a medical condition that increases the risk of breathing in droplets from your own mouth and nose. °· Have a weakened body defense system (immune system). °· Are a smoker. °· Travel to areas where the germs that cause this illness are common. °· Are around certain animals or the places they live. °What are the signs or symptoms? °· A dry cough. °· A wet (productive) cough. °· Fever. °· Sweating. °· Chest pain. This often happens when breathing deeply or coughing. °· Fast breathing or trouble breathing. °· Shortness of breath. °· Shaking chills. °· Feeling tired (fatigue). °· Muscle aches. °How is this treated? °Treatment for this condition depends on many things. Most adults can be treated at home. In some cases, treatment must happen in a hospital. Treatment may include: °· Medicines given by mouth or through an IV tube. °· Being given extra oxygen. °· Respiratory therapy. °In rare cases, treatment for very bad pneumonia  may include: °· Using a machine to help you breathe. °· Having a procedure to remove fluid from around your lungs. °Follow these instructions at home: °Medicines °· Take over-the-counter and prescription medicines only as told by your doctor. °? Only take cough medicine if you are losing sleep. °· If you were prescribed an antibiotic medicine, take it as told by your doctor. Do not stop taking the antibiotic even if you start to feel better. °General instructions ° °· Sleep with your head and neck raised (elevated). You can do this by sleeping in a recliner or by putting a few pillows under your head. °· Rest as needed. Get at least 8 hours of sleep each night. °· Drink enough water to keep your pee (urine) pale yellow. °· Eat a healthy diet that includes plenty of vegetables, fruits, whole grains, low-fat dairy products, and lean protein. °· Do not use any products that contain nicotine or tobacco. These include cigarettes, e-cigarettes, and chewing tobacco. If you need help quitting, ask your doctor. °· Keep all follow-up visits as told by your doctor. This is important. °How is this prevented? °A shot (vaccine) can help prevent pneumonia. Shots are often suggested for: °· People older than 69 years of age. °· People older than 69 years of age who: °? Are having cancer treatment. °? Have long-term (chronic) lung disease. °? Have problems with their body's defense system. °You may also prevent pneumonia if you take these actions: °· Get the flu (influenza) shot every year. °· Go to the dentist as   often as told.  Wash your hands often. If you cannot use soap and water, use hand sanitizer. Contact a doctor if:  You have a fever.  You lose sleep because your cough medicine does not help. Get help right away if:  You are short of breath and it gets worse.  You have more chest pain.  Your sickness gets worse. This is very serious if: ? You are an older adult. ? Your body's defense system is weak.  You  cough up blood. Summary  Pneumonia is an infection of the lungs.  Most adults can be treated at home. Some will need treatment in a hospital.  Drink enough water to keep your pee pale yellow.  Get at least 8 hours of sleep each night. This information is not intended to replace advice given to you by your health care provider. Make sure you discuss any questions you have with your health care provider. Document Released: 12/05/2007 Document Revised: 10/08/2018 Document Reviewed: 02/13/2018 Elsevier Patient Education  2020 [employer]   Heart Failure, Self Care Heart failure is a serious condition. This sheet explains things you need to do to take care of yourself at home. To help you stay as healthy as possible, you may be asked to change your diet, take certain medicines, and make other changes in your life. Your doctor may also give you more specific instructions. If you have problems or questions, call your doctor. What are the risks? Having heart failure makes it more likely for you to have some problems. These problems can get worse if you do not take good care of yourself. Problems may include:  Blood clotting problems. This may cause a stroke.  Damage to the kidneys, liver, or lungs.  Abnormal heart rhythms. Supplies needed:  Scale for weighing yourself.  Blood pressure monitor.  Notebook.  Medicines. How to care for yourself when you have heart failure Medicines Take over-the-counter and prescription medicines only as told by your doctor. Take your medicines every day.  Do not stop taking your medicine unless your doctor tells you to do so.  Do not skip any medicines.  Get your prescriptions refilled before you run out of medicine. This is important. Eating and drinking   Eat heart-healthy foods. Talk with a diet specialist (dietitian) to create an eating plan.  Choose foods that: ? Have no trans fat. ? Are low in saturated fat and cholesterol.  Choose  healthy foods, such as: ? Fresh or frozen fruits and vegetables. ? Fish. ? Low-fat (lean) meats. ? Legumes, such as beans, peas, and lentils. ? Fat-free or low-fat dairy products. ? Whole-grain foods. ? High-fiber foods.  Limit salt (sodium) if told by your doctor. Ask your diet specialist to tell you which seasonings are healthy for your heart.  Cook in healthy ways instead of frying. Healthy ways of cooking include roasting, grilling, broiling, baking, poaching, steaming, and stir-frying.  Limit how much fluid you drink, if told by your doctor. Alcohol use  Do not drink alcohol if: ? Your doctor tells you not to drink. ? Your heart was damaged by alcohol, or you have very bad heart failure. ? You are pregnant, may be pregnant, or are planning to become pregnant.  If you drink alcohol: ? Limit how much you use to:  0-1 drink a day for women.  0-2 drinks a day for men. ? Be aware of how much alcohol is in your drink. In the [place] one drink equals one 12  oz bottle of beer (355 mL), one 5 oz glass of wine (148 mL), or one 1 oz glass of hard liquor (44 mL). Lifestyle   Do not use any products that contain nicotine or tobacco, such as cigarettes, e-cigarettes, and chewing tobacco. If you need help quitting, ask your doctor. ? Do not use nicotine gum or patches before talking to your doctor.  Do not use illegal drugs.  Lose weight if told by your doctor.  Do physical activity if told by your doctor. Talk to your doctor before you begin an exercise if: ? You are an older adult. ? You have very bad heart failure.  Learn to manage stress. If you need help, ask your doctor.  Get rehab (rehabilitation) to help you stay independent and to help with your quality of life.  Plan time to rest when you get tired. Check weight and blood pressure   Weigh yourself every day. This will help you to know if fluid is building up in your body. ? Weigh yourself every morning after you pee  (urinate) and before you eat breakfast. ? Wear the same amount of clothing each time. ? Write down your daily weight. Give your record to your doctor.  Check and write down your blood pressure as told by your doctor.  Check your pulse as told by your doctor. Dealing with very hot and very cold weather  If it is very hot: ? Avoid activities that take a lot of energy. ? Use air conditioning or fans, or find a cooler place. ? Avoid caffeine and alcohol. ? Wear clothing that is loose-fitting, lightweight, and light-colored.  If it is very cold: ? Avoid activities that take a lot of energy. ? Layer your clothes. ? Wear mittens or gloves, a hat, and a scarf when you go outside. ? Avoid alcohol. Follow these instructions at home:  Stay up to date with shots (vaccines). Get pneumococcal and flu (influenza) shots.  Keep all follow-up visits as told by your doctor. This is important. Contact a doctor if:  You gain weight quickly.  You have increasing shortness of breath.  You cannot do your normal activities.  You get tired easily.  You cough a lot.  You don't feel like eating or feel like you may vomit (nauseous).  You become puffy (swell) in your hands, feet, ankles, or belly (abdomen).  You cannot sleep well because it is hard to breathe.  You feel like your heart is beating fast (palpitations).  You get dizzy when you stand up. Get help right away if:  You have trouble breathing.  You or someone else notices a change in your behavior, such as having trouble staying awake.  You have chest pain or discomfort.  You pass out (faint). These symptoms may be an emergency. Do not wait to see if the symptoms will go away. Get medical help right away. Call your local emergency services (911 in the [place] Do not drive yourself to the hospital. Summary  Heart failure is a serious condition. To care for yourself, you may have to change your diet, take medicines, and make other  lifestyle changes.  Take your medicines every day. Do not stop taking them unless your doctor tells you to do so.  Eat heart-healthy foods, such as fresh or frozen fruits and vegetables, fish, lean meats, legumes, fat-free or low-fat dairy products, and whole-grain or high-fiber foods.  Ask your doctor if you can drink alcohol. You may have to stop alcohol  use if you have very bad heart failure.  Contact your doctor if you gain weight quickly or feel that your heart is beating too fast. Get help right away if you pass out, or have chest pain or trouble breathing. This information is not intended to replace advice given to you by your health care provider. Make sure you discuss any questions you have with your health care provider. Document Released: 10/01/2018 Document Revised: 09/30/2018 Document Reviewed: 10/01/2018 Elsevier Patient Education  2020 [employer]

## 2018-12-29 NOTE — Progress Notes (Signed)
Patient Saturations on Room Air at Rest = 91  Patient Saturations on [employer] while Ambulating = 88

## 2018-12-29 NOTE — TOC Transition Note (Addendum)
Transition of Care [facility]) - CM/SW Discharge Note   Patient Details  Name: [name] MRN: [id number] Date of Birth: 1950/09/02  Transition of [facility] CM/SW Contact:  Zenon Mayo, RN Phone Number: 12/29/2018, 1:06 PM   Clinical Narrative:    From home with wife, NCM offered choice for HHPT, [place],  He chose [facility], referral given to [facility], soc will begin 24-48 hrs post dc.   He has home oxygen .    Final next level of care: Home w Home Health Services Barriers to Discharge: No Barriers Identified   Patient Goals and CMS Choice Patient states their goals for this hospitalization and ongoing recovery are:: enjoy life CMS Medicare.gov Compare Post Acute Care list provided to:: Patient Choice offered to / list presented to : Patient  Discharge Placement                       Discharge Plan and Services   Discharge Planning Services: CM Consult            DME Arranged: (NA)         HH Arranged: RN, PT HH Agency: [facility] Date [place]: 12/29/18 Time [place]: 1305 Representative spoke with at [place]: cory  Social Determinants of Health ([place]) Interventions     Readmission Risk Interventions Readmission Risk Prevention Plan 10/17/2018  Transportation Screening Complete  PCP or Specialist Appt within 3-5 Days Complete  HRI or [place] Complete  Social Work Consult for [place] Planning/Counseling Complete  Palliative Care Screening Not Applicable  Medication Review [employer] photographer) Complete  Some recent data might be hidden

## 2018-12-29 NOTE — Progress Notes (Signed)
Physical Therapy Treatment Patient Details Name: Harold ManisRodney Dib MRN: [id number] DOB: 10/14/1950 Today's Date: 12/29/2018    History of Present Illness 68 year old male with chronic combined systolic and diastolic CHF (recent echo with EF of 45-50%), diabetes mellitus, hypertension who presented with acute onset of shortness of breath and found by EMS to be hypoxic with sats in the 30s.  Wife also concerned that he may have passed out    PT Comments    Pt anxious to d/c home this afternoon, but willing to participate in stair training before he goes home. Pt on RA on entry with SaO2 88-92%O2. With ambulation/stair training SaO2 dropped to 86%O2 but pt able to quickly rebound with pursed lipped breathing. Pt is min guard for transfers and ambulation and requires minA for ascent/descent of steps. Pt reports wife assists with stairs at baseline. D/c home this afternoon.    Follow Up Recommendations  Home health PT;Supervision/Assistance - 24 hour     Equipment Recommendations  None recommended by PT    Recommendations for Other Services       Precautions / Restrictions Precautions Precautions: Fall Restrictions Weight Bearing Restrictions: No    Mobility  Bed Mobility               General bed mobility comments: in chair at entry  Transfers Overall transfer level: Needs assistance Equipment used: Rolling walker (2 wheeled) Transfers: Sit to/from Stand Sit to Stand: Min guard            Ambulation/Gait Ambulation/Gait assistance: Min guard Gait Distance (Feet): 20 Feet Assistive device: Rolling walker (2 wheeled) Gait Pattern/deviations: Step-to pattern Gait velocity: decreased Gait velocity interpretation: <1.8 ft/sec, indicate of risk for recurrent falls General Gait Details: fatigues easily and discharging this afternoon so limited ambulation distance   Stairs Stairs: Yes Stairs assistance: Min assist Stair Management: One rail Left;Forwards;Sideways;Step  to pattern Number of Stairs: 3 General stair comments: pt reports using trashcans at home for support, pt able to ascend/descend 3 steps with min A for steadying,          Balance Overall balance assessment: Needs assistance Sitting-balance support: Feet supported;No upper extremity supported Sitting balance-Leahy Scale: Good     Standing balance support: Single extremity supported;During functional activity;No upper extremity supported Standing balance-Leahy Scale: Fair                              Cognition Arousal/Alertness: Awake/alert Behavior During Therapy: WFL for tasks assessed/performed Overall Cognitive Status: Within Functional Limits for tasks assessed                                           General Comments General comments (skin integrity, edema, etc.): Pt on RA with SaO2 86-96%O2, able to maintain SaO2 >90% with pursed lipped breathing, but stops as soon as not being cued anymore. Pt educated on need for increased pursed lipped breathing especially with ambulation and stair climbing.      Pertinent Vitals/Pain Pain Assessment: No/denies pain           PT Goals (current goals can now be found in the care plan section) Acute Rehab PT Goals Patient Stated Goal: go home PT Goal Formulation: With patient Potential to Achieve Goals: Good Progress towards PT goals: Progressing toward goals    Frequency    Min 3X/week  PT Plan Current plan remains appropriate       AM-PAC PT "6 Clicks" Mobility   Outcome Measure  Help needed turning from your back to your side while in a flat bed without using bedrails?: None Help needed moving from lying on your back to sitting on the side of a flat bed without using bedrails?: None Help needed moving to and from a bed to a chair (including a wheelchair)?: A Little Help needed standing up from a chair using your arms (e.g., wheelchair or bedside chair)?: A Little Help needed to  walk in hospital room?: A Little Help needed climbing 3-5 steps with a railing? : A Lot 6 Click Score: 19    End of Session Equipment Utilized During Treatment: Gait belt Activity Tolerance: Patient tolerated treatment well Patient left: in bed Nurse Communication: Mobility status PT Visit Diagnosis: Unsteadiness on feet (R26.81)     Time: 3893-7342 PT Time Calculation (min) (ACUTE ONLY): 17 min  Charges:  $Gait Training: 8-22 mins                     Sumedha Munnerlyn B. Migdalia Dk PT, DPT Acute Rehabilitation Services Pager [phone] Office [phone]    [place] 12/29/2018, 4:22 PM

## 2018-12-29 NOTE — Discharge Summary (Signed)
Physician Discharge Summary  [name] ZOX:[id number] DOB: Mar 22, 1951 DOA: 12/25/2018  PCP: [facility], [facility], MD (Inactive)  Admit date: 12/25/2018 Discharge date: 12/29/2018  Admitted From: Home Disposition: Home with home health  Recommendations for Outpatient Follow-up:  1. Follow up with PCP in 1-2 weeks 2. Follow-up with cardiologist in 1-2 weeks.  Patient needs 30-day event monitor as outpatient. 3. Cardiothoracic surgery office will call with an appointment for his thoracic aortic aneurysm.  Home Health: RN and PT Equipment/Devices: Oxygen 2 L via nasal cannula on ambulation and at bedtime  Discharge Condition: Fair CODE STATUS: Full code Diet recommendation: Heart Healthy / Carb Modified /    Discharge Diagnoses:  Principal Problem:   Acute respiratory failure with hypoxemia (HCC)   Active Problems:   Lobar pneumonia (HCC)   Syncope   HTN (hypertension)   Acute urinary retention   Acute on chronic combined systolic and diastolic CHF (congestive heart failure) (HCC)   Diabetes mellitus type 2, controlled, without complications (HCC)   Thoracic aortic aneurysm (HCC)  Brief narrative/HPI 68 year old male with chronic combined systolic and diastolic CHF (recent echo with EF of 45-50%), diabetes mellitus, hypertension who presented with acute onset of shortness of breath and found by EMS to be hypoxic with sats in the 30s.  Wife also concerned that he may have passed out.  Patient reported having some burning sensation in his chest and feeling very weak.  Denied any fevers, chills, nausea, vomiting, abdominal pain, bowel or urinary symptoms.  In April he was hospitalized for shortness of breath and found to have pneumonia and also a PEA arrest.  He was taken off scheduled Lasix by his cardiologist and placed on PRN Lasix recently. In the ED patient was in acute hypoxic respiratory failure and septic.  Placed on BiPAP.  He was tachycardic and tachypneic with elevated lactic  acid of 2.9 but afebrile.  EKG showed sinus tachycardia with LBBB.  Patient found to be in hypertensive crisis.  BNP of 172 and high sensitive troponin of 2.  COVID-19 was negative. ABG showed pH of 7.28, PCO2 47.6 and PO2 of 32.  White count of 10.5.  Chest x-ray showed large area of right-sided consolidation concerning for pneumonia along with bilateral congestion.  Patient also placed on nitroglycerin drip. Admitted for acute on chronic CHF exacerbation and right-sided pneumonia.  Hospital course Principal Problem:   Acute respiratory failure with hypoxemia (HCC) Secondary to multifocal right-sided pneumonia (healthcare associated pneumonia) and acute on chronic systolic CHF. Required BiPAP on admission.  Slowly improving and now weaned to 2 L via nasal cannula.    Evaluated for home O2 need and patient desats to high 80s on room air.  He will need to be on 2 L via nasal cannula on ambulation and at bedtime.  Blood culture not sent on admission.  Transition to oral Rocephin and azithromycin.  COVID-19 was tested negative.    Patient complete 5-day course of antibiotic today. Has poor dentition.  Seen by speech and swallow nurse.  No signs of aspiration.     Active Problems: Acute on chronic combined systolic and diastolic CHF (HCC) Was taken off scheduled Lasix by his cardiologist recently.  Diuresing parenteral on IV Lasix 40 mg every 12 hours with negative balance of 4 L since admission.      Much improved clinically.  Continue aspirin, statin and beta-blocker.  Patient was taking Lasix as needed and will discharge him on daily scheduled dose.  ?  Syncope Could be  associated with hypoxia.  Patient denies passing out but wife informs on the phone that he was unresponsive for several seconds.    This has happened previously as well.  Troponins normal.  Stable on telemetry.  Electrolytes stable.  Given history of PEA previously will benefit from 30-day outpatient Holter monitoring upon  discharge.    Have messaged his cardiologist office to arrange for 30-day event monitor and outpatient follow-up.  Hypertensive crisis Required IV nitroglycerin on admission which was weaned off.  Home dose Coreg resumed.  Blood pressure has remained stable.  Chronic kidney disease stage III Creatinine at baseline  diabetes mellitus type 2 with hyperglycemia Metformin held in the hospital and added low-dose bedtime Lantus with stable CBG.  Resume metformin upon discharge.  Anemia of chronic disease Stable.  Monitor.  BPH Noted to have urinary retention of almost 1 L on admission coud catheter placed.  Continue Flomax and finasteride.  Catheter removed and voiding without difficulty.  Chronic lymphedema Wound care consulted.  Recommended given extensive peeling of the skin twice daily washing of the lower extremities soap and water and apply 10% urea cream, elevate legs frequently. Improved on follow-up exam.  GERD Continue PPI  Ascending thoracic aortic aneurysm Seen on CT scan measuring 5.1 cm.  This is new as the last CT chest in the system from 09/2013 showed dilated ascending aorta measuring 4.7 cm with no aneurysm.  Recommend semiannual imaging follow-up with CTA or MRA.  Will refer to cardiothoracic surgery for outpatient follow-up.  (Cardiothoracic surgery office will arrange for outpatient visit)  Seen by PT who recommends home health.   Code Status : Full code  Family Communication  : Discussed with wife on the phone  Disposition Plan  : Home    Consults  : None  Procedures  : CT chest  Discharge Instructions   Allergies as of 12/29/2018      Reactions   Cashew Nut Oil Palpitations   Percocet [oxycodone-acetaminophen] Other (See Comments)   Hallucintaions      Medication List    TAKE these medications   albuterol 108 (90 Base) MCG/ACT inhaler Commonly known as: VENTOLIN HFA Inhale 2 puffs into the lungs every 6 (six) hours as needed for  wheezing or shortness of breath.   aspirin EC 81 MG tablet Take 81 mg by mouth daily.   atorvastatin 40 MG tablet Commonly known as: LIPITOR Take 1 tablet (40 mg total) by mouth every evening.   carvedilol 3.125 MG tablet Commonly known as: COREG Take 1 tablet (3.125 mg total) by mouth 2 (two) times daily with a meal.   finasteride 5 MG tablet Commonly known as: PROSCAR Take 5 mg by mouth daily.   furosemide 40 MG tablet Commonly known as: LASIX Take 1 tablet (40 mg total) by mouth daily. What changed:   when to take this  reasons to take this   gabapentin 300 MG capsule Commonly known as: NEURONTIN Take 300 mg by mouth 4 (four) times daily.   hydrOXYzine 50 MG capsule Commonly known as: VISTARIL Take 50 mg by mouth 3 (three) times daily.   metFORMIN 1000 MG tablet Commonly known as: GLUCOPHAGE Take 1 tablet by mouth 2 (two) times a day.   montelukast 10 MG tablet Commonly known as: SINGULAIR Take 10 mg by mouth at bedtime.   multivitamin tablet Take 1 tablet by mouth daily.   pantoprazole 40 MG tablet Commonly known as: Protonix Take 1 tablet (40 mg total) by mouth daily.  tamsulosin 0.4 MG Caps capsule Commonly known as: FLOMAX Take 0.4 mg by mouth daily.      Follow-up Information    Clinic, Medical, MD .        Tonny Bollmanooper, Michael, MD Follow up in 2 week(s).   Specialty: Cardiology Contact information: [address] [place] [id number] [phone]          Allergies  Allergen Reactions  . Cashew Nut Oil Palpitations  . Percocet [Oxycodone-Acetaminophen] Other (See Comments)    Hallucintaions        Procedures/Studies: Ct Chest Wo Contrast  Result Date: 12/25/2018 CLINICAL DATA:  Respiratory distress. EXAM: CT CHEST WITHOUT CONTRAST TECHNIQUE: Multidetector CT imaging of the chest was performed following the standard protocol without IV contrast. COMPARISON:  Radiographs of same day.  CT scan of October 11, 2013.  FINDINGS: Cardiovascular: 5.1 cm ascending thoracic aortic aneurysm is noted. This is enlarged compared to prior exam. Atherosclerosis of thoracic aorta is noted. Normal cardiac size. No pericardial effusion. Mediastinum/Nodes: No enlarged mediastinal or axillary lymph nodes, although visualization is limited due the lack of intravenous contrast. Thyroid gland, trachea, and esophagus demonstrate no significant findings. Lungs/Pleura: No pneumothorax or significant pleural effusion is noted. Bilateral airspace opacities are noted, most prominently seen in the right upper and bilateral lower lobes. This is most consistent with multifocal pneumonia. Upper Abdomen: No acute abnormality. Musculoskeletal: Old bilateral rib fractures are noted. No acute abnormality is noted. IMPRESSION: Large bilateral airspace opacities are noted, most consistent with multifocal pneumonia. 5.1 cm ascending thoracic aortic aneurysm. Recommend semi-annual imaging followup by CTA or MRA and referral to cardiothoracic surgery if not already obtained. This recommendation follows 2010 ACCF/AHA/AATS/ACR/ASA/SCA/SCAI/SIR/STS/SVM Guidelines for the Diagnosis and Management of Patients With Thoracic Aortic Disease. Circulation. 2010; 121: O130-Q657: E266-e369. Aortic aneurysm NOS (ICD10-I71.9). Aortic Atherosclerosis (ICD10-I70.0). Electronically Signed   By: Lupita RaiderJames  Green Jr M.D.   On: 12/25/2018 08:36   Dg Chest Portable 1 View  Result Date: 12/25/2018 CLINICAL DATA:  Shortness of breath hypoxia and altered mental status. EXAM: PORTABLE CHEST 1 VIEW COMPARISON:  10/16/2018 FINDINGS: There are large areas of opacity within the right lung superimposed on bilateral mild interstitial pulmonary edema. There is mild cardiomegaly. No pneumothorax or pleural effusion. IMPRESSION: Large areas of consolidation in the right lung may indicate pneumonia or aspiration. Bilateral mild interstitial pulmonary edema. Electronically Signed   By: Deatra RobinsonKevin  Herman M.D.   On:  12/25/2018 02:30       Subjective: Continues to feel better.  No overnight events  Discharge Exam: Vitals:   12/29/18 0603 12/29/18 0756  BP: 122/79 129/76  Pulse: 84 80  Resp: 18 18  Temp:  98 F (36.7 C)  SpO2: (!) 89% 95%   Vitals:   12/28/18 2125 12/29/18 0602 12/29/18 0603 12/29/18 0756  BP: 135/75  122/79 129/76  Pulse: 74  84 80  Resp: 18  18 18   Temp: 97.7 F (36.5 C) (!) 97.5 F (36.4 C)  98 F (36.7 C)  TempSrc: Oral Oral    SpO2: 96%  (!) 89% 95%  Weight:  100.2 kg    Height:        General: Elderly male, fatigued, not in distress HEENT: Moist mucosa, supple neck Chest: Clear bilaterally CVs: Normal S1-S2, no murmurs GI: Soft, nondistended, nontender Musculoskeletal: No edema, chronic skin changes    The results of significant diagnostics from this hospitalization (including imaging, microbiology, ancillary and laboratory) are listed below for reference.  Microbiology: Recent Results (from the past 240 hour(s))  SARS Coronavirus 2 (CEPHEID- Performed in [facility] hospital lab), Hosp Order     Status: None   Collection Time: 12/25/18  2:09 AM   Specimen: Nasopharyngeal Swab  Result Value Ref Range Status   SARS Coronavirus 2 NEGATIVE NEGATIVE Final    Comment: (NOTE) If result is NEGATIVE SARS-CoV-2 target nucleic acids are NOT DETECTED. The SARS-CoV-2 RNA is generally detectable in upper and lower  respiratory specimens during the acute phase of infection. The lowest  concentration of SARS-CoV-2 viral copies this assay can detect is 250  copies / mL. A negative result does not preclude SARS-CoV-2 infection  and should not be used as the sole basis for treatment or other  patient management decisions.  A negative result may occur with  improper specimen collection / handling, submission of specimen other  than nasopharyngeal swab, presence of viral mutation(s) within the  areas targeted by this assay, and inadequate number of viral copies   (<250 copies / mL). A negative result must be combined with clinical  observations, patient history, and epidemiological information. If result is POSITIVE SARS-CoV-2 target nucleic acids are DETECTED. The SARS-CoV-2 RNA is generally detectable in upper and lower  respiratory specimens dur ing the acute phase of infection.  Positive  results are indicative of active infection with SARS-CoV-2.  Clinical  correlation with patient history and other diagnostic information is  necessary to determine patient infection status.  Positive results do  not rule out bacterial infection or co-infection with other viruses. If result is PRESUMPTIVE POSTIVE SARS-CoV-2 nucleic acids MAY BE PRESENT.   A presumptive positive result was obtained on the submitted specimen  and confirmed on repeat testing.  While 2019 novel coronavirus  (SARS-CoV-2) nucleic acids may be present in the submitted sample  additional confirmatory testing may be necessary for epidemiological  and / or clinical management purposes  to differentiate between  SARS-CoV-2 and other Sarbecovirus currently known to infect humans.  If clinically indicated additional testing with an alternate test  methodology [phone]) is advised. The SARS-CoV-2 RNA is generally  detectable in upper and lower respiratory sp ecimens during the acute  phase of infection. The expected result is Negative. Fact Sheet for Patients:  BoilerBrush.com.cy Fact Sheet for Healthcare Providers: https://pope.com/ This test is not yet approved or cleared by the [place] FDA and has been authorized for detection and/or diagnosis of SARS-CoV-2 by FDA under an Emergency Use Authorization (EUA).  This EUA will remain in effect (meaning this test can be used) for the duration of the COVID-19 declaration under Section 564(b)(1) of the Act, 21 U.S.C. section 360bbb-3(b)(1), unless the authorization is terminated  or revoked sooner. Performed at [facility], [address] [place], [place] [address]      Labs: BNP (last 3 results) Recent Labs    06/01/18 2100 10/11/18 0755 12/25/18 0214  BNP 275.1* 342.4* 172.1*   Basic Metabolic Panel: Recent Labs  Lab 12/25/18 0213 12/25/18 0254 12/25/18 0716 12/26/18 0220 12/27/18 0255 12/29/18 0811  NA 135 137 139 137 137 139  K 5.0 4.6 4.7 3.7 3.9 3.8  CL 103  --  102 98 95* 98  CO2 19*  --  GLUCOSE 311*  --  228* 194* 229* 220*  BUN 20  --  21 25* 28* 28*  CREATININE 1.60*  --  1.48* 1.72* 1.62* 1.33*  CALCIUM 8.6*  --  8.5* 8.5*  8.6* 9.0  MG  --   --  1.5* 2.0  --   --    Liver Function Tests: Recent Labs  Lab 12/25/18 0213 12/25/18 0716  AST 23 20  ALT 28 31  ALKPHOS 137* 117  BILITOT 0.5 0.9  PROT 7.9 7.1  ALBUMIN 3.3* 2.9*   No results for input(s): LIPASE, AMYLASE in the last 168 hours. No results for input(s): AMMONIA in the last 168 hours. CBC: Recent Labs  Lab 12/25/18 0213 12/25/18 0254 12/25/18 0716 12/26/18 0220  WBC 10.5  --  14.3* 12.2*  NEUTROABS 6.6  --  12.9*  --   HGB 12.3* 13.3 11.6* 11.4*  HCT 40.1 39.0 37.5* 35.5*  MCV 90.9  --  90.6 86.2  PLT 353  --  268 275   Cardiac Enzymes: No results for input(s): CKTOTAL, CKMB, CKMBINDEX, TROPONINI in the last 168 hours. BNP: Invalid input(s): POCBNP CBG: Recent Labs  Lab 12/28/18 0813 12/28/18 1155 12/28/18 1701 12/28/18 2149 12/29/18 0752  GLUCAP 184* 354* 182* 267* 211*   D-Dimer No results for input(s): DDIMER in the last 72 hours. Hgb A1c No results for input(s): HGBA1C in the last 72 hours. Lipid Profile No results for input(s): CHOL, HDL, LDLCALC, TRIG, CHOLHDL, LDLDIRECT in the last 72 hours. Thyroid function studies No results for input(s): TSH, T4TOTAL, T3FREE, THYROIDAB in the last 72 hours.  Invalid input(s): FREET3 Anemia work up No results for input(s): VITAMINB12, FOLATE, FERRITIN, TIBC, IRON, RETICCTPCT  in the last 72 hours. Urinalysis    Component Value Date/Time   COLORURINE YELLOW 06/01/2018 2157   APPEARANCEUR CLEAR 06/01/2018 2157   LABSPEC 1.011 06/01/2018 2157   PHURINE 5.0 06/01/2018 2157   GLUCOSEU 50 (A) 06/01/2018 2157   HGBUR NEGATIVE 06/01/2018 2157   BILIRUBINUR NEGATIVE 06/01/2018 2157   KETONESUR NEGATIVE 06/01/2018 2157   PROTEINUR 100 (A) 06/01/2018 2157   UROBILINOGEN 1.0 05/04/2015 1415   NITRITE NEGATIVE 06/01/2018 2157   LEUKOCYTESUR NEGATIVE 06/01/2018 2157   Sepsis Labs Invalid input(s): PROCALCITONIN,  WBC,  LACTICIDVEN Microbiology Recent Results (from the past 240 hour(s))  SARS Coronavirus 2 (CEPHEID- Performed in [facility] hospital lab), Hosp Order     Status: None   Collection Time: 12/25/18  2:09 AM   Specimen: Nasopharyngeal Swab  Result Value Ref Range Status   SARS Coronavirus 2 NEGATIVE NEGATIVE Final    Comment: (NOTE) If result is NEGATIVE SARS-CoV-2 target nucleic acids are NOT DETECTED. The SARS-CoV-2 RNA is generally detectable in upper and lower  respiratory specimens during the acute phase of infection. The lowest  concentration of SARS-CoV-2 viral copies this assay can detect is 250  copies / mL. A negative result does not preclude SARS-CoV-2 infection  and should not be used as the sole basis for treatment or other  patient management decisions.  A negative result may occur with  improper specimen collection / handling, submission of specimen other  than nasopharyngeal swab, presence of viral mutation(s) within the  areas targeted by this assay, and inadequate number of viral copies  (<250 copies / mL). A negative result must be combined with clinical  observations, patient history, and epidemiological information. If result is POSITIVE SARS-CoV-2 target nucleic acids are DETECTED. The SARS-CoV-2 RNA is generally detectable in upper and lower  respiratory specimens dur ing the acute phase of infection.  Positive  results are  indicative of active infection with SARS-CoV-2.  Clinical  correlation with patient history and other diagnostic information is  necessary to determine patient infection status.  Positive results do  not rule out bacterial infection or co-infection with other viruses. If result is PRESUMPTIVE POSTIVE SARS-CoV-2 nucleic acids MAY BE PRESENT.   A presumptive positive result was obtained on the submitted specimen  and confirmed on repeat testing.  While 2019 novel coronavirus  (SARS-CoV-2) nucleic acids may be present in the submitted sample  additional confirmatory testing may be necessary for epidemiological  and / or clinical management purposes  to differentiate between  SARS-CoV-2 and other Sarbecovirus currently known to infect humans.  If clinically indicated additional testing with an alternate test  methodology [phone]) is advised. The SARS-CoV-2 RNA is generally  detectable in upper and lower respiratory sp ecimens during the acute  phase of infection. The expected result is Negative. Fact Sheet for Patients:  StrictlyIdeas.no Fact Sheet for Healthcare Providers: BankingDealers.co.za This test is not yet approved or cleared by the [place] FDA and has been authorized for detection and/or diagnosis of SARS-CoV-2 by FDA under an Emergency Use Authorization (EUA).  This EUA will remain in effect (meaning this test can be used) for the duration of the COVID-19 declaration under Section 564(b)(1) of the Act, 21 U.S.C. section 360bbb-3(b)(1), unless the authorization is terminated or revoked sooner. Performed at [facility], [place] [address], [place], [place] [address]      Time coordinating discharge: 35 minutes  SIGNED:   Louellen Molder, MD  [facility] 12/29/2018, 11:26 AM Pager   If 7PM-7AM, please contact night-coverage [email or web address] Password [email or web address]

## 2018-12-29 NOTE — Care Management Important Message (Signed)
Important Message  Patient Details  Name: [name] MRN: [id number] Date of Birth: 08-14-1950   Medicare Important Message Given:  Yes     Ishita Mcnerney Montine Circle 12/29/2018, 4:35 PM

## 2018-12-30 ENCOUNTER — Telehealth: Payer: Self-pay | Admitting: Radiology

## 2018-12-30 NOTE — Telephone Encounter (Signed)
Enrolled patient for a 30 Day [employer] event monitor. Brief instructions were gone over with patient and his wife. They know to expect the monitor to arrive in 3-4 days

## 2019-01-01 ENCOUNTER — Telehealth: Payer: Self-pay | Admitting: Cardiovascular Disease

## 2019-01-01 NOTE — Telephone Encounter (Signed)
°*  STAT* If patient is at the pharmacy, call can be transferred to refill team.   1. Which medications need to be refilled? (please list name of each medication and dose if known) Furosemide 40 mg  2. Which pharmacy/location (including street and city if local pharmacy) is medication to be sent to?  3. Do they need a 30 day or 90 day supply? 30 but would like 90 day

## 2019-01-07 ENCOUNTER — Encounter (INDEPENDENT_AMBULATORY_CARE_PROVIDER_SITE_OTHER): Payer: Medicare Other

## 2019-01-07 DIAGNOSIS — R55 Syncope and collapse: Secondary | ICD-10-CM

## 2019-01-08 ENCOUNTER — Other Ambulatory Visit: Payer: Self-pay

## 2019-01-09 ENCOUNTER — Encounter: Payer: Medicare Other | Admitting: Cardiothoracic Surgery

## 2019-01-09 NOTE — Progress Notes (Signed)
This encounter was created in error - please disregard.

## 2019-01-13 ENCOUNTER — Telehealth: Payer: Self-pay | Admitting: Cardiovascular Disease

## 2019-01-13 NOTE — Telephone Encounter (Signed)
LMTCB for [name] [phone] and the pt.

## 2019-01-13 NOTE — Telephone Encounter (Signed)
New Message     Inez Catalina says the pts weight is up 5lbs but he has lost 3 lbs and he looks swollen. She would like to schedule a couple more visits with him for next week    Please call

## 2019-01-14 NOTE — Telephone Encounter (Signed)
I spoke to the patient's wife to confirm his appt 7/20 with Gae Bon and further discuss medication. 7/15

## 2019-01-15 ENCOUNTER — Other Ambulatory Visit: Payer: Self-pay

## 2019-01-16 ENCOUNTER — Institutional Professional Consult (permissible substitution) (INDEPENDENT_AMBULATORY_CARE_PROVIDER_SITE_OTHER): Payer: Medicare Other | Admitting: Cardiothoracic Surgery

## 2019-01-16 ENCOUNTER — Encounter: Payer: Self-pay | Admitting: Cardiothoracic Surgery

## 2019-01-16 ENCOUNTER — Telehealth: Payer: Self-pay | Admitting: Cardiology

## 2019-01-16 VITALS — BP 128/80 | HR 99 | Resp 18 | Ht 67.0 in | Wt 232.0 lb

## 2019-01-16 DIAGNOSIS — I7121 Aneurysm of the ascending aorta, without rupture: Secondary | ICD-10-CM

## 2019-01-16 DIAGNOSIS — I712 Thoracic aortic aneurysm, without rupture, unspecified: Secondary | ICD-10-CM

## 2019-01-16 NOTE — Telephone Encounter (Signed)
New Message ° ° ° °Left message to confirm appt and answer covid questions  °

## 2019-01-16 NOTE — Progress Notes (Signed)
[address]       [place] [id number]             [phone]     CARDIOTHORACIC SURGERY CONSULTATION REPORT  Referring Provider is Eddie Northhungel, Nishant, MD Primary Cardiologist is Tonny BollmanMichael Cooper, MD PCP is [facility], [facility], MD (Inactive)  Chief Complaint  Patient presents with  . Thoracic Aortic Aneurysm    new patient consultation, CTA chest 12/25/2018, ECHO 10/15/2018    HPI:  68 yo man with CHF and h/o frequent pneumonia presents for evaluation of incidentally discovered asc aortic aneurysm. No family history of aneurysm; denies tobacco abuse; denies chest pain. Does have frequent shortness of breath, attributed to COPD.   Past Medical History:  Diagnosis Date  . 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 Diabetes mellitus 10/17/2011   newly dx today  . Edema leg    right leg has leaky valve and right foot swells  . Emphysema   . Excessive ear wax   . Fatty liver   . Hernia    near navel  . Hyperlipidemia   . Hypertension   . Leaky heart valve   . Neuropathy   . Rheumatic fever   . Slow urinary stream   . TIA (transient ischemic attack)     Past Surgical History:  Procedure Laterality Date  . CIRCUMCISION    . LITHOTRIPSY      Family History  Problem Relation Age of Onset  . Emphysema Mother   . Aneurysm Mother   . Emphysema Father   . Coronary artery disease Brother   . Coronary artery disease Brother   . Coronary artery disease Sister     Social History   Socioeconomic History  . Marital status: Married    Spouse name: Not on file  . Number of children: Not on file  . Years of education: Not on file  . Highest education level: Not on file  Occupational History  . Not on file  Social Needs  . Financial resource strain: Not very hard  . Food insecurity    Worry: Never true    Inability: Never true  . Transportation needs    Medical: Patient refused    Non-medical: Patient refused   Tobacco Use  . Smoking status: Never Smoker  . Smokeless tobacco: Never Used  Substance and Sexual Activity  . Alcohol use: No  . Drug use: No  . Sexual activity: Not Currently    Partners: Female    Birth control/protection: None  Lifestyle  . Physical activity    Days per week: 0 days    Minutes per session: 0 min  . Stress: Not at all  Relationships  . Social Musicianconnections    Talks on phone: Patient refused    Gets together: Patient refused    Attends religious service: Patient refused    Active member of club or organization: Patient refused    Attends meetings of clubs or organizations: Patient refused    Relationship status: Patient refused  . Intimate partner violence    Fear of current or ex partner: No    Emotionally abused: No    Physically abused: No    Forced sexual activity: No  Other Topics Concern  . Not on file  Social History Narrative   Patient lives in private residence with supportive spouse    Current Outpatient Medications  Medication Sig Dispense Refill  .  albuterol (PROVENTIL HFA;VENTOLIN HFA) 108 (90 Base) MCG/ACT inhaler Inhale 2 puffs into the lungs every 6 (six) hours as needed for wheezing or shortness of breath. 1 Inhaler 2  . aspirin EC 81 MG tablet Take 81 mg by mouth daily.    Marland Kitchen atorvastatin (LIPITOR) 40 MG tablet Take 1 tablet (40 mg total) by mouth every evening. 30 tablet 0  . carvedilol (COREG) 3.125 MG tablet Take 1 tablet (3.125 mg total) by mouth 2 (two) times daily with a meal. 180 tablet 3  . finasteride (PROSCAR) 5 MG tablet Take 5 mg by mouth daily.    . furosemide (LASIX) 40 MG tablet Take 1 tablet (40 mg total) by mouth daily. 30 tablet 3  . gabapentin (NEURONTIN) 300 MG capsule Take 300 mg by mouth 4 (four) times daily.     . hydrOXYzine (VISTARIL) 50 MG capsule Take 50 mg by mouth 3 (three) times daily.    . metFORMIN (GLUCOPHAGE) 1000 MG tablet Take 1 tablet by mouth 2 (two) times a day.    . montelukast (SINGULAIR) 10 MG  tablet Take 10 mg by mouth at bedtime.    . Multiple Vitamin (MULTIVITAMIN) tablet Take 1 tablet by mouth daily.    . pantoprazole (PROTONIX) 40 MG tablet Take 1 tablet (40 mg total) by mouth daily. 30 tablet 1  . tamsulosin (FLOMAX) 0.4 MG CAPS capsule Take 0.4 mg by mouth daily.     No current facility-administered medications for this visit.     Allergies  Allergen Reactions  . Cashew Nut Oil Palpitations  . Percocet [Oxycodone-Acetaminophen] Other (See Comments)    Hallucintaions      Review of Systems:   General:  Decreased energy level; increased fatigue  Cardiac:  Heart failure  Respiratory:  Frequent PNA  GI:   reflux  GU:    kidney disease  Vascular:  neg  Neuro:    instability of gait, + memory/cognitive dysfunction  Musculoskeletal: decrsd mobility   Skin:   neg  Psych:   neg   Eyes:   neg  ENT:   neg  Hematologic:  neg  Endocrine:  DM     Physical Exam:   BP 128/80 (BP Location: Right Arm, Patient Position: Sitting, Cuff Size: Normal)   Pulse 99   Resp 18   Ht 5\' 7"  (1.702 m)   Wt 105.2 kg   SpO2 92% Comment: RA  BMI 36.34 kg/m   General:  chronically-ill appearing, nad  HEENT:  Unremarkable   Neck:   no JVD, no bruits, no adenopathy   Chest:   clear to auscultation, symmetrical breath sounds, no wheezes, no rhonchi   CV:   RRR, no  murmur   Abdomen:  soft, non-tender, no masses   Extremities:  warm, well-perfused, pulses , no LE edema  Rectal/GU  Deferred  Neuro:   Grossly non-focal and symmetrical throughout  Skin:   Clean and dry, no rashes, no breakdown   Diagnostic Tests:  Imaging personally reviewed   Impression:  68 yo man with approx 5 cm asc aortic aneurysm; he is a poor candidate for elective surgery given history of frequent aspiration PNA   Plan:  F/u as needed with CT surgery. Since he is a poor candidate and they are not interested in surgery, in my opinion there is no need for serial evaluation of the aorta.    I spent  in excess of 30 minutes during the conduct of this office consultation and >50% of  this time involved direct face-to-face encounter with the patient for counseling and/or coordination of their care.          Level 3 Office Consult = 40 minutes         Level 4 Office Consult = 60 minutes         Level 5 Office Consult = 80 minutes Taite Baldassari Z. Vickey SagesAtkins, MD 01/16/2019 2:35 PM

## 2019-01-18 NOTE — Progress Notes (Signed)
Cardiology Office Note:    Date:  01/19/2019   ID:  [name], DOB 04-25-1951, MRN [id number]  PCP:  [facility], [facility], MD (Inactive)  Cardiologist:  Tonny Bollman, MD  Referring MD: No ref. provider found   Chief Complaint  Patient presents with   Hospitalization Follow-up    CHF, Pneumonia    History of Present Illness:    [name] is a 68 y.o. male with a past medical history significant for HTN, HL, DM, AAA, mild AI and mild non-obstructive CAD.He also probably has COPD, although he has never smoked but he has had prolonged exposure to second hand smoke. Pt was recently hospitalized in 10/2018 for pneumonia. Has been infor pneumonia4 times needing intubation. He had subsequent PEA cardiac arrest in the ambulancewith this last episode and was intubated. This was felt to be related hypoxia.Echo showed LVEF of 45-50% (similar to prior) and mild LVH.There is mild dilatation of the aortic root measuring 39mm and of the ascending aorta measuring 44 mm.He was diuresed. He had AKI and home ARB was held.   And follow-up after his hospitalization his creatinine increased from 1.4-1.73.  Lasix was reduced to as needed only.  He was last seen in our office on 12/22/2018 at which time he reported feeling great.  His weight was remaining stable with a baseline weight of 230 pounds.  He was only taking Lasix rarely.  He was noted to have continued lower extremity swelling related to venous stasis.  Repeat labs on 12/29/2018 showed improved creatinine to 1.33.  Patient was hospitalized 6/25-6/29/2020 for acute shortness of breath and found by EMS to be hypoxic with oxygen saturation in the 30s.  His wife was concerned that he may have passed out.  He had reported some burning sensation in his chest and feeling very weak.  Was in hypertensive crisis.  BNP was 172 and high-sensitivity troponin of 2.  He was treated for acute on chronic CHF exacerbation and multifocal right-sided pneumonia with  antibiotics nitroglycerin drip for blood pressure.  He required BiPAP on admission.  He was discharged on oxygen 2 L via nasal cannula for ambulation and at bedtime.  Daily Lasix dosing was resumed.  Possible syncope could have been related to severe hypoxia.  30-day outpatient cardiac monitor was arranged.  Patient was found to have a 5.1 m ascending thoracic aortic aneurysm on CT scan.  This was new from prior.  He was seen on 01/16/2019 by Dr. Vickey Sages, cardiothoracic surgery, who felt that the patient is a poor candidate for elective surgery given history of frequent aspiration pneumonia.  His recommendation is that since the patient is a poor candidate and he is not interested in surgery, there is no need for serial evaluation of the aorta.  [name] is here today with his wife for hospital follow up. She notes that pt has a learning disability. He is using his oxygen at night and sometimes after he has been outside and feels fatigued and short of breath. He does not have oxygen with him currently. He now has a pulse oximeter. Per his wife his lowest O2 sat recently was 83% and he put the oxygen on. He is mostly at 93%.  Due to his PNA felt to be related to aspiration, he is now limiting food after 6:00 pm. He now sleeps in a chair. He and his wife are very afraid of his going into respiratory distress again. She says that she was told that his blood sugar rising  and fever were early warning signs of pneumonia. She wants a prescription for a glucose meter and thermometer. I directed her to the patient's PCP and his wife says that it is the health clinic and she has trouble getting assistance through them.   The patient has tight lower leg edema and skin changes consistent with venous insufficiency. He has had no chest pain or discomfort. He does not know if he has orthopnea because he is sleeping in a chair due to his fear of aspiration.   Home weights have been fairly stable at 229-231 lbs per home  log brought by his wife.    Past Medical History:  Diagnosis Date   Blockage of coronary artery of heart ([place]) 10/17/2011   wife states blocked 30%   Diabetes mellitus 10/17/2011   newly dx today   Edema leg    right leg has leaky valve and right foot swells   Emphysema    Excessive ear wax    Fatty liver    Hernia    near navel   Hyperlipidemia    Hypertension    Leaky heart valve    Neuropathy    Rheumatic fever    Slow urinary stream    TIA (transient ischemic attack)     Past Surgical History:  Procedure Laterality Date   CIRCUMCISION     LITHOTRIPSY      Current Medications: Current Meds  Medication Sig   albuterol (PROVENTIL HFA;VENTOLIN HFA) 108 (90 Base) MCG/ACT inhaler Inhale 2 puffs into the lungs every 6 (six) hours as needed for wheezing or shortness of breath.   aspirin EC 81 MG tablet Take 81 mg by mouth daily.   atorvastatin (LIPITOR) 40 MG tablet Take 1 tablet (40 mg total) by mouth every evening.   carvedilol (COREG) 3.125 MG tablet Take 1 tablet (3.125 mg total) by mouth 2 (two) times daily with a meal.   finasteride (PROSCAR) 5 MG tablet Take 5 mg by mouth daily.   furosemide (LASIX) 40 MG tablet Take 1 tablet (40 mg total) by mouth daily.   gabapentin (NEURONTIN) 300 MG capsule Take 300 mg by mouth 4 (four) times daily.    hydrOXYzine (VISTARIL) 50 MG capsule Take 50 mg by mouth 3 (three) times daily.   metFORMIN (GLUCOPHAGE) 1000 MG tablet Take 1 tablet by mouth 2 (two) times a day.   montelukast (SINGULAIR) 10 MG tablet Take 10 mg by mouth at bedtime.   Multiple Vitamin (MULTIVITAMIN) tablet Take 1 tablet by mouth daily.   pantoprazole (PROTONIX) 40 MG tablet Take 1 tablet (40 mg total) by mouth daily.   tamsulosin (FLOMAX) 0.4 MG CAPS capsule Take 0.4 mg by mouth daily.     Allergies:   Cashew nut oil and Percocet [oxycodone-acetaminophen]   Social History   Socioeconomic History   Marital status: Married     Spouse name: Not on file   Number of children: Not on file   Years of education: Not on file   Highest education level: Not on file  Occupational History   Not on file  Social Needs   Financial resource strain: Not very hard   Food insecurity    Worry: Never true    Inability: Never true   Transportation needs    Medical: Patient refused    Non-medical: Patient refused  Tobacco Use   Smoking status: Never Smoker   Smokeless tobacco: Never Used  Substance and Sexual Activity   Alcohol use: No  Drug use: No   Sexual activity: Not Currently    Partners: Female    Birth control/protection: None  Lifestyle   Physical activity    Days per week: 0 days    Minutes per session: 0 min   Stress: Not at all  Relationships   Social connections    Talks on phone: Patient refused    Gets together: Patient refused    Attends religious service: Patient refused    Active member of club or organization: Patient refused    Attends meetings of clubs or organizations: Patient refused    Relationship status: Patient refused  Other Topics Concern   Not on file  Social History Narrative   Patient lives in private residence with supportive spouse     Family History: The patient's family history includes Aneurysm in his mother; Coronary artery disease in his brother, brother, and sister; Emphysema in his father and mother. ROS:   Please see the history of present illness.     All other systems reviewed and are negative.  EKGs/Labs/Other Studies Reviewed:    The following studies were reviewed today:  Echocardiogram 10/16/18 1. The left ventricle has mildly reduced systolic function, with an ejection fraction of 45-50%. The cavity size was normal. There is mild concentric left ventricular hypertrophy. Left ventricular diastolic Doppler parameters are consistent with  impaired relaxation. There is abnormal septal motion consistent with left bundle branch block. Left  ventricular diffuse hypokinesis. 2. The right ventricle has normal systolc function. The cavity was normal. There is no increase in right ventricular wall thickness. 3. There is severe mitral annular calcification present. 4. The aortic valve was not well visualized Moderate thickening of the aortic valve Moderate calcification of the aortic valve. Aortic valve regurgitation is mild by color flow Doppler. 5. There is mild dilatation of the aortic root measuring 39mm and of the ascending aorta measuring 44 mm.  FINDINGS Left Ventricle: The left ventricle has mildly reduced systolic function, with an ejection fraction of 45-50%. The cavity size was normal. There is mild concentric left ventricular hypertrophy. Left ventricular diastolic Doppler parameters are consistent with impaired relaxation (grade I). Normal left ventricular filling pressures There is abnormal (paradoxical) septal motion, consistent with left bundle branch block. Left ventricular diffuse hypokinesis.  EKG:  EKG is not ordered today.    Recent Labs: 12/25/2018: ALT 31; B Natriuretic Peptide 172.1; TSH 2.029 12/26/2018: Hemoglobin 11.4; Magnesium 2.0; Platelets 275 12/29/2018: BUN 28; Creatinine, Ser 1.33; Potassium 3.8; Sodium 139   Recent Lipid Panel    Component Value Date/Time   CHOL 165 10/16/2018 0452   TRIG 204 (H) 10/16/2018 0452   HDL 33 (L) 10/16/2018 0452   CHOLHDL 5.0 10/16/2018 0452   VLDL 41 (H) 10/16/2018 0452   LDLCALC 91 10/16/2018 0452    Physical Exam:    VS:  BP 120/74    Pulse 68    Ht 5\' 7"  (1.702 m)    Wt 229 lb (103.9 kg)    SpO2 94%    BMI 35.87 kg/m     Wt Readings from Last 3 Encounters:  01/19/19 229 lb (103.9 kg)  01/16/19 232 lb (105.2 kg)  12/29/18 220 lb 14.4 oz (100.2 kg)     Physical Exam  Constitutional: He is oriented to person, place, and time. No distress.  Chronically ill appearing male  Neck: Normal range of motion. Neck supple. No JVD present.  Cardiovascular:  Normal rate, regular rhythm, normal heart sounds and intact distal  pulses. Exam reveals no gallop and no friction rub.  No murmur heard. Pulmonary/Chest: Effort normal and breath sounds normal. No respiratory distress. He has no wheezes. He has no rales.  Abdominal: Soft.  Musculoskeletal:        General: Edema present.     Comments: Tight 2+ lower leg edema and darkened skin.   Neurological: He is alert and oriented to person, place, and time.  Skin: Skin is warm and dry.  Psychiatric: He has a normal mood and affect. His behavior is normal.  Vitals reviewed.   ASSESSMENT:    1. Acute on chronic heart failure with preserved ejection fraction (HCC)   2. Hyperlipidemia, unspecified hyperlipidemia type   3. Type 2 diabetes mellitus without complication, without long-term current use of insulin (HCC)   4. AKI (acute kidney injury) (HCC)   5. Chronic venous insufficiency   6. Thoracic ascending aortic aneurysm (HCC)   7. Educated About Covid-19 Virus Infection   8. Recurrent pneumonia   9. Chronic obstructive pulmonary disease, unspecified COPD type (HCC)   10. Essential hypertension    PLAN:    In order of problems listed above:  1. Acute on Chronic systolic heart failure -EF 45-50% per echo when hospitalized for pneumonia with PEA arrest in 10/2018. -On carvedilol 3.125 mg BID, lasix 40 mg. Not on ARB due to renal function. -Recently admitted again for CHF exacerbation and multifocal pneumonia which seems to have been the driving factor for CHF. Pt had severe hypoxia, weakness and possible syncope. 30 day cardiac monitor is in place. -Placed back on lasix 40 mg daily.  -Wt is stable. Pt still has significant lower leg edema with skin changes consistent with venous insufficiency.  -Discussed daily wts and monitoring for increase in edema, shortness of breath, orthopnea and what to report with pt and his wife. His wife is very concerned with how she can help him avoid another episode  of respiratory distress as he has had repeated pneumonia. I advised his wife that she can give him an extra dose of lasix if needed, keep oxygen saturation above 90% using the oxygen, and I will place a referral to pulmonology that he has seen in the past. I am also providing a Rx for a new glucometer and a thermometer (to try to catch pna early). Will arrange for close follow up in 1 month in our office. I have advised pt to see his PCP (wife is reluctant, but I think he needs to be seen). We will provide options for PCP through the [facility].   2. Hyperlipidemia -On atorvastatin 40 mg daily. LDL 91. Continue current therapy.   3. DM type 2 -Hgb A1c 10.6 back in April 2019, improved to 8.0 on 10/16/18. He would do better with better blood sugar control. I encouraged him to see his PCP. I will also send this note.   4. AKI on [name] -SCr increased to 1.72 in hospital, down to 1.33 at discharge which is at baseline for him.   5.  Chronic venous insufficiency -Pt encourage on low sodium diet and elevate legs.  6  Ascending thoracic aortic aneurysm -5.1 m ascending thoracic aortic aneurysm on CT scan.  This was new from prior.  He was seen on 01/16/2019 by Dr. Vickey SagesAtkins, cardiothoracic surgery, who felt that the patient is a poor candidate for elective surgery given history of frequent aspiration pneumonia.  His recommendation is that since the patient is a poor candidate and he is  not interested in surgery, there is no need for serial evaluation of the aorta.   Medication Adjustments/Labs and Tests Ordered: Current medicines are reviewed at length with the patient today.  Concerns regarding medicines are outlined above. Labs and tests ordered and medication changes are outlined in the patient instructions below:  Patient Instructions  Medication Instructions:  Your physician recommends that you continue on your current medications as directed. Please refer to the Current Medication list  given to you today.  If you need a refill on your cardiac medications before your next appointment, please call your pharmacy.   Lab work: TODAY: BMET   If you have labs (blood work) drawn today and your tests are completely normal, you will receive your results only by:  [employer] (if you have MyChart) OR  A paper copy in the mail If you have any lab test that is abnormal or we need to change your treatment, we will call you to review the results.  Testing/Procedures: You have been referred to pulmonology (Dr.Groce). Someone from their office will contact you with an appointment.   Follow-Up: At [facility], you and your health needs are our priority.  As part of our continuing mission to provide you with exceptional heart care, we have created designated Provider Care Teams.  These Care Teams include your primary Cardiologist (physician) and Advanced Practice Providers (APPs -  Physician Assistants and Nurse Practitioners) who all work together to provide you with the care you need, when you need it. You will need a follow up appointment in:  1 months.  Please call our office 2 months in advance to schedule this appointment.  You may see Tonny Bollman, MD or one of the following Advanced Practice Providers on your designated Care Team: Tereso Newcomer, PA-C Vin Drasco, [place]  Berton Bon, NP  Any Other Special Instructions Will Be Listed Below (If Applicable).  Do the following things EVERY DAY:   1. Weigh yourself EVERY morning after you go to the bathroom but before you eat or drink anything. Write this number down in a weight log/diary. If you gain 3 pounds overnight or 5 pounds in a week, call the office. You can take an extra dose of lasix if wt is up 3 pounds or more. Call the office if weight does not come back down or continues to rise.    2. Take your medicines as prescribed. If you have concerns about your medications, please call us before you stop taking them.     3. Eat low salt foods--Limit salt (sodium) to 2000 mg per day. This will help prevent your body from holding onto fluid. Read food labels as many processed foods have a lot of sodium, especially canned goods and prepackaged meats. If you would like some assistance choosing low sodium foods, we would be happy to set you up with a nutritionist.   4. Stay as active as you can everyday. Staying active will give you more energy and make your muscles stronger. Start with 5 minutes at a time and work your way up to 30 minutes a day. Break up your activities--do some in the morning and some in the afternoon. Start with 3 days per week and work your way up to 5 days as you can.  If you have chest pain, feel short of breath, dizzy, or lightheaded, STOP. If you don't feel better after a short rest, call 911. If you do feel better, call the office to let us know  you have symptoms with exercise.   5. Limit all fluids for the day to less than 2 liters. Fluid includes all drinks, coffee, juice, ice chips, soup, jello, and all other liquids.   Lizabeth LeydenNina Perrin Eddleman, NP         Signed, Berton BonJanine Sadia Belfiore, NP  01/19/2019 4:48 PM    [facility]

## 2019-01-19 ENCOUNTER — Encounter: Payer: Self-pay | Admitting: Cardiology

## 2019-01-19 ENCOUNTER — Other Ambulatory Visit: Payer: Self-pay

## 2019-01-19 ENCOUNTER — Ambulatory Visit (INDEPENDENT_AMBULATORY_CARE_PROVIDER_SITE_OTHER): Payer: Medicare Other | Admitting: Cardiology

## 2019-01-19 VITALS — BP 120/74 | HR 68 | Ht 67.0 in | Wt 229.0 lb

## 2019-01-19 DIAGNOSIS — I872 Venous insufficiency (chronic) (peripheral): Secondary | ICD-10-CM

## 2019-01-19 DIAGNOSIS — N179 Acute kidney failure, unspecified: Secondary | ICD-10-CM | POA: Diagnosis not present

## 2019-01-19 DIAGNOSIS — E785 Hyperlipidemia, unspecified: Secondary | ICD-10-CM

## 2019-01-19 DIAGNOSIS — Z7189 Other specified counseling: Secondary | ICD-10-CM

## 2019-01-19 DIAGNOSIS — I5033 Acute on chronic diastolic (congestive) heart failure: Secondary | ICD-10-CM

## 2019-01-19 DIAGNOSIS — I712 Thoracic aortic aneurysm, without rupture: Secondary | ICD-10-CM

## 2019-01-19 DIAGNOSIS — J449 Chronic obstructive pulmonary disease, unspecified: Secondary | ICD-10-CM

## 2019-01-19 DIAGNOSIS — E119 Type 2 diabetes mellitus without complications: Secondary | ICD-10-CM

## 2019-01-19 DIAGNOSIS — I10 Essential (primary) hypertension: Secondary | ICD-10-CM

## 2019-01-19 DIAGNOSIS — J189 Pneumonia, unspecified organism: Secondary | ICD-10-CM

## 2019-01-19 DIAGNOSIS — I7121 Aneurysm of the ascending aorta, without rupture: Secondary | ICD-10-CM

## 2019-01-19 NOTE — Patient Instructions (Addendum)
Medication Instructions:  Your physician recommends that you continue on your current medications as directed. Please refer to the Current Medication list given to you today.  If you need a refill on your cardiac medications before your next appointment, please call your pharmacy.   Lab work: TODAY: BMET   If you have labs (blood work) drawn today and your tests are completely normal, you will receive your results only by: Marland Kitchen MyChart Message (if you have MyChart) OR . A paper copy in the mail If you have any lab test that is abnormal or we need to change your treatment, we will call you to review the results.  Testing/Procedures: You have been referred to pulmonology (Dr.Groce). Someone from their office will contact you with an appointment.   Follow-Up: At [facility], you and your health needs are our priority.  As part of our continuing mission to provide you with exceptional heart care, we have created designated Provider Care Teams.  These Care Teams include your primary Cardiologist (physician) and Advanced Practice Providers (APPs -  Physician Assistants and Nurse Practitioners) who all work together to provide you with the care you need, when you need it. You will need a follow up appointment in:  1 months.  Please call our office 2 months in advance to schedule this appointment.  You may see Sherren Mocha, MD or one of the following Advanced Practice Providers on your designated Care Team: Richardson Dopp, PA-C [place], [place] . Daune Perch, NP  Any Other Special Instructions Will Be Listed Below (If Applicable).  Do the following things EVERY DAY:   1. Weigh yourself EVERY morning after you go to the bathroom but before you eat or drink anything. Write this number down in a weight log/diary. If you gain 3 pounds overnight or 5 pounds in a week, call the office. You can take an extra dose of lasix if wt is up 3 pounds or more. Call the office if weight does not come back down  or continues to rise.    2. Take your medicines as prescribed. If you have concerns about your medications, please call us before you stop taking them.    3. Eat low salt foods-Limit salt (sodium) to 2000 mg per day. This will help prevent your body from holding onto fluid. Read food labels as many processed foods have a lot of sodium, especially canned goods and prepackaged meats. If you would like some assistance choosing low sodium foods, we would be happy to set you up with a nutritionist.   4. Stay as active as you can everyday. Staying active will give you more energy and make your muscles stronger. Start with 5 minutes at a time and work your way up to 30 minutes a day. Break up your activities--do some in the morning and some in the afternoon. Start with 3 days per week and work your way up to 5 days as you can.  If you have chest pain, feel short of breath, dizzy, or lightheaded, STOP. If you don't feel better after a short rest, call 911. If you do feel better, call the office to let us know you have symptoms with exercise.   5. Limit all fluids for the day to less than 2 liters. Fluid includes all drinks, coffee, juice, ice chips, soup, jello, and all other liquids.   Pecolia Ades, NP

## 2019-01-20 LAB — BASIC METABOLIC PANEL
BUN/Creatinine Ratio: 17 (ref 10–24)
BUN: 25 mg/dL (ref 8–27)
CO2: 27 mmol/L (ref 20–29)
Calcium: 9.4 mg/dL (ref 8.6–10.2)
Chloride: 94 mmol/L — ABNORMAL LOW (ref 96–106)
Creatinine, Ser: 1.5 mg/dL — ABNORMAL HIGH (ref 0.76–1.27)
GFR calc Af Amer: 54 mL/min/{1.73_m2} — ABNORMAL LOW (ref >59)
GFR calc non Af Amer: 47 mL/min/{1.73_m2} — ABNORMAL LOW (ref >59)
Glucose: 169 mg/dL — ABNORMAL HIGH (ref 65–99)
Potassium: 4.5 mmol/L (ref 3.5–5.2)
Sodium: 138 mmol/L (ref 134–144)

## 2019-01-20 NOTE — Progress Notes (Signed)
Agree with medical program as you have outlined and pulmonary consultation with ongoing close cardiology follow-up. Wonder about outpatient heart failure services for him?

## 2019-01-21 ENCOUNTER — Telehealth: Payer: Self-pay

## 2019-01-21 ENCOUNTER — Ambulatory Visit: Payer: Medicare Other | Admitting: Cardiology

## 2019-01-21 DIAGNOSIS — I5022 Chronic systolic (congestive) heart failure: Secondary | ICD-10-CM

## 2019-01-21 NOTE — Telephone Encounter (Signed)
Referral to the Heart Failure Clinic Per Pecolia Ades, NP

## 2019-01-27 ENCOUNTER — Telehealth: Payer: Self-pay | Admitting: Licensed Clinical Social Worker

## 2019-01-27 NOTE — Telephone Encounter (Signed)
CSW contacted patient to inform the last delivery of the [employer] Summer Covid relief program will be July 31st. Patient verbalizes understanding and grateful for the support during this health crisis. Patient provided with the [employer] number to contact if interested in continuing with a private pay option. Patient denies any other concerns at this time. CSW continues to be available if needed.  Jackie Lillyan Hitson, LCSW, CCSW-MCS [phone]  

## 2019-01-30 ENCOUNTER — Encounter: Payer: Self-pay | Admitting: Podiatry

## 2019-01-30 ENCOUNTER — Other Ambulatory Visit: Payer: Self-pay

## 2019-01-30 ENCOUNTER — Ambulatory Visit (INDEPENDENT_AMBULATORY_CARE_PROVIDER_SITE_OTHER): Payer: Medicare Other | Admitting: Podiatry

## 2019-01-30 DIAGNOSIS — B351 Tinea unguium: Secondary | ICD-10-CM | POA: Diagnosis not present

## 2019-01-30 DIAGNOSIS — M79674 Pain in right toe(s): Secondary | ICD-10-CM | POA: Diagnosis not present

## 2019-01-30 DIAGNOSIS — M79675 Pain in left toe(s): Secondary | ICD-10-CM

## 2019-01-30 DIAGNOSIS — E119 Type 2 diabetes mellitus without complications: Secondary | ICD-10-CM

## 2019-01-30 NOTE — Progress Notes (Signed)
This patient presents to the office with chief complaint of long thick nails and diabetic feet.  This patient  says there  is  no pain and discomfort in his  feet.  This patient says there are long thick painful nails.  He is very anxious since he believes this service will be very painful.  These nails are painful walking and wearing shoes.  Patient has no history of infection or drainage from both feet.  Patient is unable to  self treat his own nails . This patient presents  to the office today for treatment of the  long nails and a foot evaluation due to history of  Diabetes. 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   General Appearance  Alert, conversant and in no acute stress.  Vascular  Dorsalis pedis and posterior tibial  pulses are absent    bilaterally due to swelling..  Capillary return is within normal limits  bilaterally. Temperature is within normal limits  bilaterally.  Neurologic  Senn-Weinstein monofilament wire test within normal limits  bilaterally. Muscle power within normal limits bilaterally.  Nails Thick disfigured discolored nails long hallux nails  B/L.  Orthopedic   Limitations of motion of both rearfeet .  No crepitus or effusions noted.  No bony pathology or digital deformities noted.  Skin  normotropic skin with no porokeratosis noted bilaterally.  No signs of infections or ulcers noted.     Onychomycosis  Diabetes with vascular pathology.  IE  Debride nails x 10.  A diabetic foot exam was performed and there is no evidence of  neurologic pathology.   There is vascular pathology noted.   RTC 3 months.   Gardiner Barefoot DPM

## 2019-02-11 ENCOUNTER — Encounter: Payer: Self-pay | Admitting: Acute Care

## 2019-02-11 ENCOUNTER — Ambulatory Visit (INDEPENDENT_AMBULATORY_CARE_PROVIDER_SITE_OTHER): Payer: Medicare Other

## 2019-02-11 ENCOUNTER — Ambulatory Visit (INDEPENDENT_AMBULATORY_CARE_PROVIDER_SITE_OTHER): Payer: Medicare Other | Admitting: Acute Care

## 2019-02-11 ENCOUNTER — Other Ambulatory Visit: Payer: Self-pay

## 2019-02-11 VITALS — BP 110/64 | HR 87 | Ht 67.0 in | Wt 228.4 lb

## 2019-02-11 DIAGNOSIS — I712 Thoracic aortic aneurysm, without rupture, unspecified: Secondary | ICD-10-CM

## 2019-02-11 DIAGNOSIS — Z0000 Encounter for general adult medical examination without abnormal findings: Secondary | ICD-10-CM

## 2019-02-11 DIAGNOSIS — I5043 Acute on chronic combined systolic (congestive) and diastolic (congestive) heart failure: Secondary | ICD-10-CM | POA: Diagnosis not present

## 2019-02-11 DIAGNOSIS — J302 Other seasonal allergic rhinitis: Secondary | ICD-10-CM

## 2019-02-11 DIAGNOSIS — G4733 Obstructive sleep apnea (adult) (pediatric): Secondary | ICD-10-CM | POA: Diagnosis not present

## 2019-02-11 DIAGNOSIS — J181 Lobar pneumonia, unspecified organism: Secondary | ICD-10-CM

## 2019-02-11 DIAGNOSIS — K219 Gastro-esophageal reflux disease without esophagitis: Secondary | ICD-10-CM

## 2019-02-11 DIAGNOSIS — J9601 Acute respiratory failure with hypoxia: Secondary | ICD-10-CM

## 2019-02-11 DIAGNOSIS — G4734 Idiopathic sleep related nonobstructive alveolar hypoventilation: Secondary | ICD-10-CM

## 2019-02-11 NOTE — Progress Notes (Signed)
History of Present Illness [name] is a 68 y.o. male never smoker with recent hospital admission  from 10/27/16-11/02/2016 for aspiration pneumonia vs. CAP, and diastolic heart failure . 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 He has oxygen at night for nocturnal desaturations. He was previously followed by Dr. Corrie Waller  Synopsis 68 year old male with chronic combined systolic and diastolic CHF (recent echo with EF of 45-50%), diabetes mellitus, hypertension who presented 12/25/2018 with acute onset of shortness of breath and found by EMS to be hypoxic with sats in the 30s.  Wife also concerned that he may have passed out.  Patient reported having some burning sensation in his chest and feeling very weak. Chest x-ray showed large area of right-sided consolidation concerning for pneumonia along with bilateral congestion.    In April he was hospitalized for shortness of breath and found to have pneumonia and also a PEA arrest. Has had prior SLP evals without finding of significant dysphagia, though there were some prior complaints of globus. He was deemed a moderate aspiration risk.       02/12/2019  Pt presents for hospital follow up after hospitalization 12/25/2018-12/29/2018. He was admitted with acute respiratory failure  2/2 multifocal pneumonia and  acute on chronic heart failure and hypertensive crisis requiring NTG gtt. He had recently been taken off his lasix by cards because of acute kidney injury. He required BiPAP therapy during admission. He was treated with oxygen therapy, antibiotics and swallow evaluation was done ( No overt aspiration noted but deemed a moderate risk and given suggestions to prevent aspiration.  He was  restarted on diuretic with close monitoring of his renal function. He has [name] stage III. He was instructed to continue aspirin, statin and beta-blocker.  He has been referred to cardiology and thoracic surgery for follow up of  ascending thoracic aortic aneurysm  measuring 5.1 cm.    Pt. States he has been doing well. He has had nursing and physical therapy since discharge, however the PT, nursing will stop this week. He has been compliant with Singulair daily. He has been following the speech therapists advice to eat small meals and nothing after 6 pm. He states he rarely wheezes. He does not have cough or secretions present. No fever. He does live in a house with grandchildren. His wife does keep an eye on his oxygen saturations. They have limited resources and his wife is concerned that he has had pneumonia several times recently, usually in March. She wants to be proactive to prevent this./ would prefer  to treat as an outpatient if at all possible. He denies fever, chest pain, orthopnea or hemoptysis. He did have pneumococcal vaccine 01/2018, and has an appointment for Prevnar 13  vaccine on 02/25/2019. He apparently is unable to take the flu vaccine per his wife. She states every time he gets the vaccine he gets flu. We discussed the need to take the vaccine for Covid 19 once one is available.   Test Results:  CXR 02/11/2019 Resolved consolidative airspace disease. Persistent mild diffuse prominence of the parahilar interstitial markings, favor cephalization of the pulmonary vasculature, without overt pulmonary edema. Mild bibasilar scarring versus atelectasis.  CT Chest 12/25/2018 Large bilateral airspace opacities are noted, most consistent with multifocal pneumonia. 5.1 cm ascending thoracic aortic aneurysm. Recommend semi-annual imaging followup by CTA or MRA and referral to cardiothoracic surgery if not already obtained.  CBC Latest Ref Rng & Units 12/26/2018 12/25/2018 12/25/2018  WBC 4.0 - 10.5 K/uL  12.2(H) 14.3(H) -  Hemoglobin 13.0 - 17.0 g/dL 11.4(L) 11.6(L) 13.3  Hematocrit 39.0 - 52.0 % 35.5(L) 37.5(L) 39.0  Platelets 150 - 400 K/uL 275 268 -    BMP Latest Ref Rng & Units 01/19/2019 12/29/2018 12/27/2018  Glucose 65 - 99 mg/dL 169(H)  220(H) 229(H)  BUN 8 - 27 mg/dL 25 28(H) 28(H)  Creatinine 0.76 - 1.27 mg/dL 1.50(H) 1.33(H) 1.62(H)  BUN/Creat Ratio 10 - 24 17 - -  Sodium 134 - 144 mmol/L 138 139 137  Potassium 3.5 - 5.2 mmol/L 4.5 3.8 3.9  Chloride 96 - 106 mmol/L 94(L) 98 95(L)  CO2 20 - 29 mmol/L _0 Calcium 8.6 - 10.2 mg/dL 9.4 9.0 8.6(L)    BNP    Component Value Date/Time   BNP 172.1 (H) 12/25/2018 0214    ProBNP    Component Value Date/Time   PROBNP 178.3 (H) 10/10/2013 0849    PFT No results found for: FEV1PRE, FEV1POST, FVCPRE, FVCPOST, TLC, DLCOUNC, PREFEV1FVCRT, PSTFEV1FVCRT  Dg Chest 2 View  Result Date: 02/11/2019 CLINICAL DATA:  Follow-up pneumonia EXAM: CHEST - 2 VIEW COMPARISON:  12/25/2018 chest radiograph. FINDINGS: Stable cardiomediastinal silhouette with top-normal heart size. No pneumothorax. No pleural effusion. Resolved consolidative airspace disease. Mild diffuse prominence of the parahilar interstitial markings. Mild bibasilar scarring versus atelectasis. Stable peripheral bilateral pleural thickening, right greater than left. IMPRESSION: Resolved consolidative airspace disease. Persistent mild diffuse prominence of the parahilar interstitial markings, favor cephalization of the pulmonary vasculature, without overt pulmonary edema. Mild bibasilar scarring versus atelectasis. Electronically Signed   By: Ilona Sorrel M.D.   On: 02/11/2019 16:26     Past medical hx Past Medical History:  Diagnosis Date  . 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 Diabetes mellitus 10/17/2011   newly dx today  . Edema leg    right leg has leaky valve and right foot swells  . Emphysema   . Excessive ear wax   . Fatty liver   . Hernia    near navel  . Hyperlipidemia   . Hypertension   . Leaky heart valve   . Neuropathy   . Rheumatic fever   . Slow urinary stream   . TIA (transient ischemic attack)      Social History   Tobacco Use  . Smoking  status: Never Smoker  . Smokeless tobacco: Never Used  Substance Use Topics  . Alcohol use: No  . Drug use: No    Mr.[name] reports that he has never smoked. He has never used smokeless tobacco. He reports that he does not drink alcohol or use drugs.  Tobacco Cessation: Never smoker with COPD 2/2  Heavy second hand smoke exposure ( per patient history)  Past surgical hx, Family hx, Social hx all reviewed.  Current Outpatient Medications on File Prior to Visit  Medication Sig  . Accu-Chek FastClix Lancets MISC USE UTD  . ACCU-CHEK GUIDE test strip U TO TEST BLOOD SUGAR QD  . albuterol (PROVENTIL HFA;VENTOLIN HFA) 108 (90 Base) MCG/ACT inhaler Inhale 2 puffs into the lungs every 6 (six) hours as needed for wheezing or shortness of breath.  Marland Kitchen aspirin EC 81 MG tablet Take 81 mg by mouth daily.  Marland Kitchen atorvastatin (LIPITOR) 40 MG tablet Take 1 tablet (40 mg total) by mouth every evening.  . Blood Glucose Monitoring Suppl (ACCU-CHEK GUIDE) w/Device KIT U TO TEST BLOOD SUGAR QD  . carvedilol (COREG) 6.25 MG tablet   .  finasteride (PROSCAR) 5 MG tablet Take 5 mg by mouth daily.  . furosemide (LASIX) 40 MG tablet Take 1 tablet (40 mg total) by mouth daily.  Marland Kitchen gabapentin (NEURONTIN) 300 MG capsule Take 300 mg by mouth 4 (four) times daily.   . hydrOXYzine (VISTARIL) 50 MG capsule Take 50 mg by mouth 3 (three) times daily.  . metFORMIN (GLUCOPHAGE) 1000 MG tablet Take 1 tablet by mouth 2 (two) times a day.  . montelukast (SINGULAIR) 10 MG tablet Take 10 mg by mouth at bedtime.  . Multiple Vitamin (MULTIVITAMIN) tablet Take 1 tablet by mouth daily.  . pantoprazole (PROTONIX) 40 MG tablet Take 1 tablet (40 mg total) by mouth daily.  . tamsulosin (FLOMAX) 0.4 MG CAPS capsule Take 0.4 mg by mouth daily.   No current facility-administered medications on file prior to visit.      Allergies  Allergen Reactions  . Cashew Nut Oil Palpitations  . Percocet [Oxycodone-Acetaminophen] Other (See Comments)     Hallucintaions    Review Of Systems:  Constitutional:   No  weight loss, night sweats,  Fevers, chills, + baseline fatigue, or  lassitude.  HEENT:   No headaches,  Difficulty swallowing,  Tooth/dental problems, or  Sore throat,                No sneezing, itching, ear ache, nasal congestion, post nasal drip,   CV:  No chest pain,  Orthopnea, PND, + baseline swelling in lower extremities, No anasarca, dizziness, palpitations, syncope.   GI  + occasional  heartburn, indigestion, No abdominal pain, nausea, vomiting, diarrhea, change in bowel habits, loss of appetite, bloody stools.   Resp: Occasional  shortness of breath with exertion none  at rest.  No excess mucus, no productive cough,  No non-productive cough,  No coughing up of blood.  No change in color of mucus.  No wheezing.  No chest wall deformity  Skin: no rash or lesions.  GU: no dysuria, change in color of urine, no urgency or frequency.  No flank pain, no hematuria   MS:  No joint pain or swelling.  No decreased range of motion.  No back pain.  Psych:  No change in mood or affect. No depression or anxiety.  No memory loss.   Vital Signs BP 110/64 (BP Location: Left Arm, Cuff Size: Normal)   Pulse 87   Ht _0  (1.702 m)   Wt 228 lb 6.4 oz (103.6 kg)   SpO2 96%   BMI 35.77 kg/m    Physical Exam:  General- No distress,  A&Ox3, pleasant deconditioned gentleman ( Legally blind) ENT: No sinus tenderness, TM clear, pale nasal mucosa, no oral exudate,no post nasal drip, no LAN Cardiac: S1, S2, regular rate and rhythm, no murmur, No JVD noted Chest: No wheeze/ rales/ dullness; no accessory muscle use, no nasal flaring, no sternal retractions, diminished per bases Abd.: Soft Non-tender, ND, BS +, Body mass index is 35.77 kg/m. Ext: No clubbing cyanosis, bilateral lower extremity 1+edema Neuro:  Deconditioned at baseline, MAE x 4, A&O x 3, legally blind Skin: No rashes, NO lesions,  warm and dry Psych: normal mood and  behavior   Assessment/Plan  Lobar pneumonia ([place]) Resolved per CXR 02/11/2019 Close follow up for prevention especially in the spring and fall when allergies could be a contributing factor. Follow up in 3 months with Judson Roch NP  Acute hypoxic and hypercarbic respiratory failure ([place]) in setting of bilateral pulmonary infiltrates. Presume CAP + pulmonary edema +/-ALI  Resolved post hospitalization Saturations on RA 01/2019  Were 96% Will get PFT's as patient states he has been told he has COPD from second hand smoke Wife  Reminded that sats need to be > 88% at all times Follow up in 3 months  Acute on chronic combined systolic and diastolic CHF (congestive heart failure) (HCC) Controlled on current dosage of lasix 40 mg daily with close monitoring of his renal function, and daily weights with a plan for days his weight is up. He has been referred to the heart failure clinic  Seasonal allergies ? May be contributing to frequent recurrent pneumonia ? Aspirating secretions while he sleeps Plan Continue Singulair 10 mg daily Start non-sedating anti histamine with any sign of post nasal drip or allergies. Frequent follow up every 3 months as preventative and to promote proactive OP therapy as to avoid inpatient admission requirements   Health care maintenance Does not take flu vaccine Pneumococcal vaccine 01/2018 Scheduled for Prevnar 13 on 02/25/2019  GERD (gastroesophageal reflux disease) Continue Protonix 40 mg once daily as you have been doing Remember to try not to eat after 6 pm.  Nocturnal hypoxemia Continue wearing oxygen at 2 L [place] at bedtime as you have been doing  OSA (obstructive sleep apnea) Concern for OSA Per wife patient snored Plan Sleep study for evaluation for OSA  Thoracic aortic aneurysm ([place]) 5.1 cm on CT 12/2018 Referred to [facility] for semi annual scanning to monitor for progression   This appointment was 45 min long with over 50% of the time in direct  face-to-face patient care, assessment, plan of care, and follow-up.   Magdalen Spatz, NP 02/12/2019  11:58 AM

## 2019-02-11 NOTE — Patient Instructions (Addendum)
It is good to see you today CXR today We will call you with results. Please continue using your lasix daily as prescribed  Continue weighing yourself every day  Follow directions of your cardiologist when your weight is up. Continue Singulair 10 mg daily Start non-sedating anti histamine with any sign of post nasal drip or allergies. Continue medication for your reflux Remember to try not to eat after 6 pm. We will practice preventative care . We will schedule every 3 month appointments to make sure we can keep a close eye for any flares. ( Nov 2020) Continue wearing oxygen at bedtime at 2 L [place] We will schedule a sleep study to evaluate for Obstructive sleep apnea.  We will schedule you for Pulmonary Function tests Keep an eye on oxygen saturations , make sure they are always > 88%. Please contact office for sooner follow up if symptoms do not improve or worsen or seek emergency care

## 2019-02-12 ENCOUNTER — Encounter: Payer: Self-pay | Admitting: Acute Care

## 2019-02-12 DIAGNOSIS — G4733 Obstructive sleep apnea (adult) (pediatric): Secondary | ICD-10-CM | POA: Insufficient documentation

## 2019-02-12 DIAGNOSIS — G4734 Idiopathic sleep related nonobstructive alveolar hypoventilation: Secondary | ICD-10-CM | POA: Insufficient documentation

## 2019-02-12 DIAGNOSIS — K219 Gastro-esophageal reflux disease without esophagitis: Secondary | ICD-10-CM | POA: Insufficient documentation

## 2019-02-12 DIAGNOSIS — Z0000 Encounter for general adult medical examination without abnormal findings: Secondary | ICD-10-CM | POA: Insufficient documentation

## 2019-02-12 DIAGNOSIS — J302 Other seasonal allergic rhinitis: Secondary | ICD-10-CM | POA: Insufficient documentation

## 2019-02-12 NOTE — Assessment & Plan Note (Signed)
5.1 cm on CT 12/2018 Referred to [facility] for semi annual scanning to monitor for progression

## 2019-02-12 NOTE — Assessment & Plan Note (Addendum)
Resolved post hospitalization Saturations on RA 01/2019  Were 96% Will get PFT's as patient states he has been told he has COPD from second hand smoke Wife  Reminded that sats need to be > 88% at all times Follow up in 3 months

## 2019-02-12 NOTE — Assessment & Plan Note (Signed)
?   May be contributing to frequent recurrent pneumonia ? Aspirating secretions while he sleeps Plan Continue Singulair 10 mg daily Start non-sedating anti histamine with any sign of post nasal drip or allergies. Frequent follow up every 3 months as preventative and to promote proactive OP therapy as to avoid inpatient admission requirements

## 2019-02-12 NOTE — Assessment & Plan Note (Signed)
Does not take flu vaccine Pneumococcal vaccine 01/2018 Scheduled for Prevnar 13 on 02/25/2019

## 2019-02-12 NOTE — Assessment & Plan Note (Signed)
Resolved per CXR 02/11/2019 Close follow up for prevention especially in the spring and fall when allergies could be a contributing factor. Follow up in 3 months with Judson Roch NP

## 2019-02-12 NOTE — Assessment & Plan Note (Signed)
Concern for OSA Per wife patient snored Plan Sleep study for evaluation for OSA

## 2019-02-12 NOTE — Assessment & Plan Note (Signed)
Continue Protonix 40 mg once daily as you have been doing Remember to try not to eat after 6 pm.

## 2019-02-12 NOTE — Assessment & Plan Note (Addendum)
Controlled on current dosage of lasix 40 mg daily with close monitoring of his renal function, and daily weights with a plan for days his weight is up. He has been referred to the heart failure clinic

## 2019-02-12 NOTE — Assessment & Plan Note (Signed)
Continue wearing oxygen at 2 L [place] at bedtime as you have been doing

## 2019-02-16 ENCOUNTER — Ambulatory Visit (INDEPENDENT_AMBULATORY_CARE_PROVIDER_SITE_OTHER): Payer: Medicare Other | Admitting: Acute Care

## 2019-02-16 ENCOUNTER — Other Ambulatory Visit: Payer: Self-pay

## 2019-02-16 ENCOUNTER — Telehealth: Payer: Self-pay | Admitting: Acute Care

## 2019-02-16 ENCOUNTER — Encounter: Payer: Self-pay | Admitting: Acute Care

## 2019-02-16 DIAGNOSIS — Z20822 Contact with and (suspected) exposure to covid-19: Secondary | ICD-10-CM

## 2019-02-16 DIAGNOSIS — E119 Type 2 diabetes mellitus without complications: Secondary | ICD-10-CM

## 2019-02-16 DIAGNOSIS — R6889 Other general symptoms and signs: Secondary | ICD-10-CM

## 2019-02-16 DIAGNOSIS — G629 Polyneuropathy, unspecified: Secondary | ICD-10-CM

## 2019-02-16 MED ORDER — DOXYCYCLINE HYCLATE 100 MG PO TABS: 100.0000 mg | ORAL_TABLET | Freq: Two times a day (BID) | ORAL | 0 refills | Status: DC

## 2019-02-16 NOTE — Progress Notes (Signed)
Virtual Visit via Telephone Note  I connected with [name] on 02/16/19 at 10:30 AM EDT by telephone and verified that I am speaking with the correct person using two identifiers.  I  confirmed date of birth and address to authenticate  Identity. My nurse Rae Halstedamara Augustin reviewed medications and ordered any refills required.  Location: Patient: At home Provider: In the office at [address] , [place], [place], [address]   I discussed the limitations, risks, security and privacy concerns of performing an evaluation and management service by telephone and the availability of in person appointments. I also discussed with the patient that there may be a patient responsible charge related to this service. The patient expressed understanding and agreed to proceed.  Synopsis 68 year old male never smoker  with chronic combined systolic and diastolic CHF (recent echo with EF of 45-50%), diabetes mellitus, hypertension who presented 12/25/2018 with acute onset of shortness of breath and found by EMS to be hypoxic with sats in the 30s. Wife also concerned that he may have passed out. Patient reported having some burning sensation in his chest and feeling very weak. Chest x-ray showed large area of right-sided consolidation concerning for pneumonia along with bilateral congestion. In April he was hospitalized for shortness of breath and found to have pneumonia and also a PEA arrest. Has had prior SLP evals without finding of significant dysphagia, though there were some prior complaints of globus. He was deemed a moderate aspiration risk.    History of Present Illness: Pt. Presents for acute tele visit. He was last seen in the office 8//06/2019 as a hospital follow up for multifocal pneumonia. CXR at that time showed resolution of pneumonia and Persistent mild diffuse prominence of the parahilar interstitial markings, favor cephalization of the pulmonary vasculature, without overt  pulmonary Edema. He stated at that visit he was feeling better than he had in a lonh while. He has called today with head and chest congestion and sats of less than 90% on room air. He has been wearing his oxygen due to his sats dropping to as low as 78%. His nose is stuffy, but he does not have any nasal secretions. Per his wife this started yesterday. They started pseudofed and Zyrtec. His grandaughter had a head cold 1-2 days ago . This granddaughter does live with the patient. He is not coughing up any secretions.  He states he is not wheezing. Blood sugars are 222. His wife is  monitoring his blood sugars. He has recently had some additional salt in his diet. I have asked his wife to monitor his weight carefully. He also has CHF. Weight was 227 lbs today 226 8/16, and 8/15 Per both the patient and his wife he has good urine output. He is compliant with his lasix daily. He states he has had chills. He does not have a thermometer at home so he cannot take his temperature.He has had a very difficult time finding a thermometer die to the lack of supply during the pandemic.   Observations/Objective: CXR 02/11/2019 Resolved consolidative airspace disease. Persistent mild diffuse prominence of the parahilar interstitial markings, favor cephalization of the pulmonary vasculature, without overt pulmonary edema. Mild bibasilar scarring versus atelectasis.  CT Chest 12/25/2018 Large bilateral airspace opacities are noted, most consistent with multifocal pneumonia. 5.1 cm ascending thoracic aortic aneurysm. Recommend semi-annual imaging followup by CTA or MRA and referral to cardiothoracic surgery if not already obtained.  Echo 10/15/2018 Left Ventricle: The left ventricle has mildly reduced  systolic function, with an ejection fraction of 45-50%. The cavity size was normal. There is mild concentric left ventricular hypertrophy. Left ventricular diastolic Doppler parameters are consistent  with  impaired relaxation (grade I). Normal left ventricular filling pressures There is abnormal (paradoxical) septal motion, consistent with left bundle branch block. Left ventricular diffuse hypokinesis.   Right Ventricle: The right ventricle has normal systolic function. The cavity was normal. There is no increase in right ventricular wall thickness. Left Atrium: Left atrial size was normal in size. Right Atrium: Right atrial size was normal in size. Interatrial Septum: No atrial level shunt detected by color flow Doppler. Pericardium: There is no evidence of pericardial effusion. Mitral Valve: There is severe mitral annular calcification present. Mitral valve regurgitation was not assessed by color flow Doppler. Tricuspid Valve: Tricuspid valve regurgitation was not visualized by color flow Doppler. Aortic Valve: The aortic valve was not well visualized Moderate thickening of the aortic valve. Moderate calcification of the aortic valve. Aortic valve regurgitation is mild by color flow Doppler. Pulmonic Valve: The pulmonic valve was normal in structure. Pulmonic valve regurgitation is trivial by color flow Doppler. Aorta: There is mild dilatation of the aortic root and of the ascending aorta measuring 39 mm. Venous: The inferior vena cava is normal in size with greater than 50% respiratory variability.   Assessment and Plan Recent Multifocal Pneumonia>> resolved as of 02/11/2019 Acute Chest congestion with chills an oxygen desaturations on room Air No wheezing per patient and wife Chills, unable to check for fever Elevated blood sugars Plan We will send in a prescription for Doxycycline 100 mg twice daily x 7 days. I want you to go and get a COVID test as you have symptoms.  We will place the order and let you know where to go for testing.  If you are not better in a day or 2 after starting antibiotic call us and let us know so we can re-assess if you need to be seen for CXR and labwork In  office follow up next Wednesday 02/25/2019  Continue to Weight yourself daily Continue to monitor blood sugars as you have been doing  Continue wearing oxygen to maintain saturations 88-92% Referral to endocrinology for diabetic management/ neuropathies Please call your PCP about leg weeping and wounds. Please work on getting a thermometer to monitor for fever. Please contact office for sooner follow up if symptoms do not improve or worsen or seek emergency care   Acute on chronic hypoxemia Desaturations on RA Plan Doxycycline 100 mg BID x 7 days Close follow up  Wear oxygen to maintain oxygen saturations 88-92% Seek emergency care for desaturations not responsive to increase in oxygen flow  CHF Chronic combined systolic and diastolic CHF (recent echo with EF of 45-50%),  Plan Continue to weigh yourself daily Follow instructions for extra lasix dosing per cardiology Low salt diet  Leg wound/ Weeping Plan Follow up with PCP re: leg weeping Doxycycline 100 mg BID to cover for potential infection Monitor for redness or purulent drainage Close follow up    Elevated Blood Sugars ? 2/2 infection respiratory vs LE wound No prednisone for now Plan Monitor blood sugars Follow up with PCP Referral to endocrinology   Nocturnal hypoxemia Wear oxygen at 2 L [place]  at bedtime  Follow Up Instructions: COVID testing  Follow up in office 02/25/2019 If no improvement in 2 days follow up for imaging and labs Please contact office for sooner follow up if symptoms do not improve or  worsen or seek emergency care  Follow up with cardiology regarding weight gain    I discussed the assessment and treatment plan with the patient. The patient was provided an opportunity to ask questions and all were answered. The patient agreed with the plan and demonstrated an understanding of the instructions.   The patient was advised to call back or seek an in-person evaluation if the symptoms worsen or if the  condition fails to improve as anticipated.  I provided 35 minutes of non-face-to-face time during this encounter.  I have emphasized the need to seek emergency care if oxygen desaturations are not quickly responsive to increase in oxygen flow  Needs to establish with Dr. Everardo AllEllison as primary pulmonologist   Bevelyn NgoSarah F Niaomi Cartaya, NP 02/16/2019  10:57 PM

## 2019-02-16 NOTE — Patient Instructions (Addendum)
It is good to talk with you today. We will send in a prescription for Doxycycline 100 mg twice daily x 7 days. I want you to go and get a COVID test as you have symptoms.  We will place the order and let you know where to go for testing.  If you are not better in a day or 2 after starting antibiotic call us and let us know so we can re-assess if you need to be seen. In office follow up next Wednesday 02/25/2019  Continue to Weight yourself daily Continue to monitor blood sugars as you have been doing  Follow algorithm for extra lasix per your cardiologist instructions Continue wearing oxygen to maintain saturations 88-92% Referral to endocrinology for diabetic management/ neuropathies Please call your PCP about leg weeping and wounds. Please work on getting a thermometer. Please contact office for sooner follow up if symptoms do not improve or worsen or seek emergency care

## 2019-02-16 NOTE — Telephone Encounter (Signed)
Attempted to call patient, no answer, left message to call back to schedule telephone visit with NP.

## 2019-02-16 NOTE — Telephone Encounter (Signed)
Front staff scheduled patient for telephone visit, nothing further needed at this time.

## 2019-02-17 ENCOUNTER — Other Ambulatory Visit (HOSPITAL_COMMUNITY): Payer: Medicare Other

## 2019-02-17 ENCOUNTER — Other Ambulatory Visit: Payer: Self-pay

## 2019-02-17 DIAGNOSIS — Z20822 Contact with and (suspected) exposure to covid-19: Secondary | ICD-10-CM

## 2019-02-18 LAB — NOVEL CORONAVIRUS, NAA: SARS-CoV-2, NAA: NOT DETECTED

## 2019-02-19 ENCOUNTER — Encounter (HOSPITAL_BASED_OUTPATIENT_CLINIC_OR_DEPARTMENT_OTHER): Payer: Medicare Other | Admitting: Pulmonary Disease

## 2019-02-24 ENCOUNTER — Telehealth (HOSPITAL_COMMUNITY): Payer: Self-pay | Admitting: Vascular Surgery

## 2019-02-24 NOTE — Telephone Encounter (Signed)
Left pt message giving new pt appt w/ [facility] OCT 2 @ 10:40AM, ASKED PT to call to office to confirm appt date and time, new pt package will be sent to pt

## 2019-02-24 NOTE — Progress Notes (Signed)
Cardiology Office Note:    Date:  02/25/2019   ID:  [name], DOB 03-08-1951, MRN [id number]  PCP:  [facility], [facility], MD (Inactive)  Cardiologist:  Sherren Mocha, MD   Electrophysiologist:  None   Referring MD: No ref. provider found   Chief Complaint  Patient presents with  . Follow-up    CHF    History of Present Illness:    [name] is a 68 y.o. male with: - Coronary artery disease  - Non-obs by Cath in 2012 - Myoview 4/15: no ischemia - Combined Systolic and Diastolic CHF - Echocardiogram 10/2018: EF 45-50 - AAA - Thoracic aortic aneurysm - CT 12/2018: 5.1 cm >> eval by TCTS (Dr. Orvan Seen) >> poor candidate for repair; no need for serial CTs - Mild aortic insufficiency  - Hypertension  - Hyperlipidemia  - Diabetes mellitus 2 - Chronic kidney disease 3 - Probable COPD - Hx of recurrent pneumonia       - s/p multiple admx for hypoxic resp failure - S/p prior PEA arrest 10/2018 - Hx of TIA   Mr. [name] has been admitted several times in the last year with hypoxic respiratory failure.  He was admitted in April 2020 with hypoxic respiratory failure in the setting of pneumonia and decompensated heart failure/pulmonary edema.  His hospitalization was complicated by PEA arrest and AKI.  EF was 45-50 by echocardiogram with mild diastolic dysfunction.  He was admitted again in June 2020 with hypoxic respiratory failure (sats reportedly 30% upon presentation) in the setting of hypertensive crisis, pneumonia and decompensated heart failure.  There was also reported syncope.  Follow-up cardiac monitor demonstrated sinus rhythm, PVCs and short runs of NSVT (up to 7 beats).  He was last seen by Daune Perch, NP in 12/2018.  He has been referred to Pulmonology and the [facility] Clinic.    He is here today with his wife.  He was recently treated for a respiratory illness.  Pulmonology put him on a round of antibiotics.   His O2 sats were low.  COVID-19 test was neg.  He notes worsening  leg swelling since.  His breathing is stable.  He has not had chest pain, syncope.  He sleeps in a recliner chronically.  He has not had paroxysmal nocturnal dyspnea.    Prior CV studies:   The following studies were reviewed today:  Cardiac Monitor 02/11/2019 1.  The basic rhythm is normal sinus with an average heart rate of 78 bpm 2.  There are PVCs and short runs of nonsustained VT up to 7 beats 3.  No evidence of atrial fibrillation, atrial flutter, or supraventricular arrhythmia 4.  No bradycardic events or pathologic pauses 5.  Intermittent bundle branch block  Chest CT 12/25/2018 IMPRESSION: Large bilateral airspace opacities are noted, most consistent with multifocal pneumonia.  5.1 cm ascending thoracic aortic aneurysm. Recommend semi-annual imaging followup by CTA or MRA and referral to cardiothoracic surgery if not already obtained. This recommendation follows 2010 ACCF/AHA/AATS/ACR/ASA/SCA/SCAI/SIR/STS/SVM Guidelines for the Diagnosis and Management of Patients With Thoracic Aortic Disease. Circulation. 2010; 121: E332-R518. Aortic aneurysm NOS (ICD10-I71.9).  Aortic Atherosclerosis (ICD10-I70.0).    Echocardiogram 10/15/2018 EF 45-50, mild conc LVH, Gr 1 DD, diff HK, normal RVSF, severe MAC, mod calc of AoV, mild AI, Ao root 39, ascending aorta 44 mm  Echocardiogram 06/02/18 EF 45-50, no RWMA, Gr 2 DD, mod AI, mild MR, mild LAE, PASP 35  Myoview 10/11/13 IMPRESSION: 1. No scintigraphic evidence of prior infarction or pharmacologically induced ischemia.  2. Mildly dilated left ventricular cavity with mild global hypokinesia. Ejection fraction - 51%.  Cardiac catheterization 03/22/11 EF 55 LM ok LAD ok OM1 and OM2 30 RCA prox 30  Past Medical History:  Diagnosis Date  . Blockage of coronary artery of heart ([place]) 10/17/2011   wife states blocked 30%  . Diabetes mellitus 10/17/2011   newly dx today  . Edema leg    right leg has leaky valve and right foot  swells  . Emphysema   . Excessive ear wax   . Fatty liver   . Hernia    near navel  . Hyperlipidemia   . Hypertension   . Leaky heart valve   . Neuropathy   . Rheumatic fever   . Slow urinary stream   . TIA (transient ischemic attack)    Surgical Hx: The patient  has a past surgical history that includes Lithotripsy and Circumcision.   Current Medications: Current Meds  Medication Sig  . Accu-Chek FastClix Lancets MISC USE UTD  . ACCU-CHEK GUIDE test strip U TO TEST BLOOD SUGAR QD  . albuterol (PROVENTIL HFA;VENTOLIN HFA) 108 (90 Base) MCG/ACT inhaler Inhale 2 puffs into the lungs every 6 (six) hours as needed for wheezing or shortness of breath.  Marland Kitchen aspirin EC 81 MG tablet Take 81 mg by mouth daily.  Marland Kitchen atorvastatin (LIPITOR) 40 MG tablet Take 1 tablet (40 mg total) by mouth every evening.  . Blood Glucose Monitoring Suppl (ACCU-CHEK GUIDE) w/Device KIT U TO TEST BLOOD SUGAR QD  . finasteride (PROSCAR) 5 MG tablet Take 5 mg by mouth daily.  Marland Kitchen gabapentin (NEURONTIN) 300 MG capsule Take 300 mg by mouth 4 (four) times daily.   . hydrOXYzine (VISTARIL) 50 MG capsule Take 50 mg by mouth 3 (three) times daily.  . metFORMIN (GLUCOPHAGE) 1000 MG tablet Take 1 tablet by mouth 2 (two) times a day.  . montelukast (SINGULAIR) 10 MG tablet Take 10 mg by mouth at bedtime.  . Multiple Vitamin (MULTIVITAMIN) tablet Take 1 tablet by mouth daily.  . pantoprazole (PROTONIX) 40 MG tablet Take 1 tablet (40 mg total) by mouth daily.  . tamsulosin (FLOMAX) 0.4 MG CAPS capsule Take 0.4 mg by mouth daily.  . [DISCONTINUED] carvedilol (COREG) 6.25 MG tablet   . [DISCONTINUED] furosemide (LASIX) 40 MG tablet Take 1 tablet (40 mg total) by mouth daily.     Allergies:   Cashew nut oil and Percocet [oxycodone-acetaminophen]   Social History   Tobacco Use  . Smoking status: Never Smoker  . Smokeless tobacco: Never Used  Substance Use Topics  . Alcohol use: No  . Drug use: No     Family Hx: The  patient's family history includes Aneurysm in his mother; Coronary artery disease in his brother, brother, and sister; Emphysema in his father and mother.  ROS:   Please see the history of present illness.    ROS All other systems reviewed and are negative.   EKGs/Labs/Other Test Reviewed:    EKG:  EKG is not ordered today.  The ekg ordered today demonstrates n/a  Recent Labs: 12/25/2018: ALT 31; B Natriuretic Peptide 172.1; TSH 2.029 12/26/2018: Hemoglobin 11.4; Magnesium 2.0; Platelets 275 01/19/2019: BUN 25; Creatinine, Ser 1.50; Potassium 4.5; Sodium 138   Recent Lipid Panel Lab Results  Component Value Date/Time   CHOL 165 10/16/2018 04:52 AM   TRIG 204 (H) 10/16/2018 04:52 AM   HDL 33 (L) 10/16/2018 04:52 AM   CHOLHDL 5.0 10/16/2018 04:52 AM  [place] 91 10/16/2018 04:52 AM    Physical Exam:    VS:  BP 124/78   Pulse 81   Ht _0  (1.702 m)   Wt 223 lb 12.8 oz (101.5 kg)   SpO2 96%   BMI 35.05 kg/m     Wt Readings from Last 3 Encounters:  02/25/19 223 lb 12.8 oz (101.5 kg)  02/11/19 228 lb 6.4 oz (103.6 kg)  01/19/19 229 lb (103.9 kg)     Physical Exam  Constitutional: He is oriented to person, place, and time. He appears well-developed and well-nourished. No distress.  HENT:  Head: Normocephalic and atraumatic.  Eyes: No scleral icterus.  Neck: No thyromegaly present.  Cardiovascular: Normal rate and regular rhythm.  Murmur heard.  Systolic murmur is present with a grade of 2/6 at the upper right sternal border. Pulmonary/Chest: Effort normal and breath sounds normal. He has no rales.  Abdominal: Soft. There is no hepatomegaly.  Musculoskeletal:        General: Edema (massive bilat brawny LE edema with chronic changes, skin desquamation and active weeping) present.  Lymphadenopathy:    He has no cervical adenopathy.  Neurological: He is alert and oriented to person, place, and time.  Skin: Skin is warm and dry.  Psychiatric: He has a normal mood and  affect.    ASSESSMENT & PLAN:     1. Chronic combined systolic and diastolic CHF  EF 40-98.  He has primarily diastolic CHF.  He is pending referral to the AHF Clinic.  Weights are stable.  However, his leg swelling is worse.  He has massive, bilat brawny edema.  He also appears to have issues with lymphedema.  I think he would have better success with Torsemide to manage his volume.  I will also refer him to the wound center to hopefully help wrap his legs and monitor his open wounds.  -Refer to wound center  -DC Lasix  -Start Torsemide 20 mg twice daily x 3 days, then 20 mg once daily   -BMET 1 week  -FU here in 2-3 weeks  -Keep appt with AHF clinic in Oct 2020.  2. Chronic obstructive pulmonary disease Continue follow up with Pulmonology   3. Thoracic ascending aortic aneurysm [facility]) Not a surgical candidate.  Continue beta-blocker   4. Type 2 diabetes mellitus  Continue follow up with PCP.   5. Essential hypertension The patient's blood pressure is controlled on his current regimen.  Continue current therapy.    6. Aortic valve insufficiency  Mild by last echocardiogram.   7. [name] (chronic kidney disease) stage 3  Recent Creatinine stable.  Repeat BMET in 1 week after changing Furosemide to Torsemide.   8. NSVT (nonsustained ventricular tachycardia) (HCC) 7 beats noted on recent monitor.  Reviewed with Dr. Burt Knack.  No further testing needed.  I will increase his beta-blocker further.  -Increase Carvedilol to 9.375 mg twice daily.    Dispo:  Return in about 2 weeks (around 03/11/2019) for Close Follow Up, w/ Dr. Burt Knack, or Richardson Dopp, PA-C, or PA/NP, in person.   Medication Adjustments/Labs and Tests Ordered: Current medicines are reviewed at length with the patient today.  Concerns regarding medicines are outlined above.  Tests Ordered: Orders Placed This Encounter  Procedures  . Basic metabolic panel  . AMB referral to wound care center   Medication Changes: Meds  ordered this encounter  Medications  . torsemide (DEMADEX) 20 MG tablet    Sig: TAKE TORSEMIDE 20 MG TWICE A  DAY FOR 3 DAYS, THEN DECREASE TO 20 MG DAILY.    Dispense:  90 tablet    Refill:  3  . carvedilol (COREG) 6.25 MG tablet    Sig: Take 1.5 tablets (9.375 mg total) by mouth 2 (two) times daily.    Dispense:  180 tablet    Refill:  3    Signed, Richardson Dopp, PA-C  02/25/2019 12:52 PM    [facility] [address], [place], [place]  [address] Phone: [phone]; Fax: [phone]

## 2019-02-25 ENCOUNTER — Encounter: Payer: Self-pay | Admitting: Acute Care

## 2019-02-25 ENCOUNTER — Ambulatory Visit (INDEPENDENT_AMBULATORY_CARE_PROVIDER_SITE_OTHER): Payer: Medicare Other | Admitting: Physician Assistant

## 2019-02-25 ENCOUNTER — Ambulatory Visit (INDEPENDENT_AMBULATORY_CARE_PROVIDER_SITE_OTHER): Payer: Medicare Other

## 2019-02-25 ENCOUNTER — Other Ambulatory Visit: Payer: Self-pay

## 2019-02-25 ENCOUNTER — Ambulatory Visit (INDEPENDENT_AMBULATORY_CARE_PROVIDER_SITE_OTHER): Payer: Medicare Other | Admitting: Acute Care

## 2019-02-25 ENCOUNTER — Telehealth: Payer: Self-pay

## 2019-02-25 ENCOUNTER — Encounter: Payer: Self-pay | Admitting: Physician Assistant

## 2019-02-25 VITALS — BP 124/78 | HR 81 | Ht 67.0 in | Wt 223.8 lb

## 2019-02-25 VITALS — BP 118/62 | HR 111 | Temp 98.4°F | Ht 67.0 in | Wt 225.0 lb

## 2019-02-25 DIAGNOSIS — E119 Type 2 diabetes mellitus without complications: Secondary | ICD-10-CM

## 2019-02-25 DIAGNOSIS — N183 Chronic kidney disease, stage 3 unspecified: Secondary | ICD-10-CM

## 2019-02-25 DIAGNOSIS — I5042 Chronic combined systolic (congestive) and diastolic (congestive) heart failure: Secondary | ICD-10-CM

## 2019-02-25 DIAGNOSIS — I472 Ventricular tachycardia: Secondary | ICD-10-CM

## 2019-02-25 DIAGNOSIS — I5022 Chronic systolic (congestive) heart failure: Secondary | ICD-10-CM | POA: Diagnosis not present

## 2019-02-25 DIAGNOSIS — J181 Lobar pneumonia, unspecified organism: Secondary | ICD-10-CM

## 2019-02-25 DIAGNOSIS — I4729 Other ventricular tachycardia: Secondary | ICD-10-CM

## 2019-02-25 DIAGNOSIS — I872 Venous insufficiency (chronic) (peripheral): Secondary | ICD-10-CM

## 2019-02-25 DIAGNOSIS — J449 Chronic obstructive pulmonary disease, unspecified: Secondary | ICD-10-CM

## 2019-02-25 DIAGNOSIS — R0902 Hypoxemia: Secondary | ICD-10-CM

## 2019-02-25 DIAGNOSIS — I10 Essential (primary) hypertension: Secondary | ICD-10-CM

## 2019-02-25 DIAGNOSIS — G4733 Obstructive sleep apnea (adult) (pediatric): Secondary | ICD-10-CM

## 2019-02-25 DIAGNOSIS — G4734 Idiopathic sleep related nonobstructive alveolar hypoventilation: Secondary | ICD-10-CM

## 2019-02-25 DIAGNOSIS — I351 Nonrheumatic aortic (valve) insufficiency: Secondary | ICD-10-CM

## 2019-02-25 DIAGNOSIS — I7121 Aneurysm of the ascending aorta, without rupture: Secondary | ICD-10-CM

## 2019-02-25 DIAGNOSIS — I712 Thoracic aortic aneurysm, without rupture: Secondary | ICD-10-CM | POA: Diagnosis not present

## 2019-02-25 DIAGNOSIS — R600 Localized edema: Secondary | ICD-10-CM

## 2019-02-25 DIAGNOSIS — Z23 Encounter for immunization: Secondary | ICD-10-CM

## 2019-02-25 DIAGNOSIS — Z0000 Encounter for general adult medical examination without abnormal findings: Secondary | ICD-10-CM

## 2019-02-25 DIAGNOSIS — J302 Other seasonal allergic rhinitis: Secondary | ICD-10-CM | POA: Diagnosis not present

## 2019-02-25 MED ORDER — CARVEDILOL 6.25 MG PO TABS: 9.3750 mg | ORAL_TABLET | Freq: Two times a day (BID) | ORAL | 3 refills | Status: DC

## 2019-02-25 MED ORDER — TORSEMIDE 20 MG PO TABS: ORAL_TABLET | ORAL | 3 refills | Status: DC

## 2019-02-25 NOTE — Patient Instructions (Addendum)
Medication Instructions:   Your physician has recommended you make the following change in your medication:  1. STOP LASIX    2.  START TORSEMIDE 20 MG TWICE A DAY FOR 3 DAYS, THEN DECREASE TO 20 MG DAILY.    3. INCREASE COREG TO 9.375 MG TWICE DAILY.   If you need a refill on your cardiac medications before your next appointment, please call your pharmacy.   Lab work: TO BE DONE IN 1 WEEK: BMET   If you have labs (blood work) drawn today and your tests are completely normal, you will receive your results only by: Marland Kitchen MyChart Message (if you have MyChart) OR . A paper copy in the mail If you have any lab test that is abnormal or we need to change your treatment, we will call you to review the results.  Testing/Procedures: NONE  Follow-Up: At  [facility], you and your health needs are our priority.  As part of our continuing mission to provide you with exceptional heart care, we have created designated Provider Care Teams.  These Care Teams include your primary Cardiologist (physician) and Advanced Practice Providers (APPs -  Physician Assistants and Nurse Practitioners) who all work together to provide you with the care you need, when you need it. . You will need a follow up appointment in:  3-4 WEEKS.  Please call our office 2 months in advance to schedule this appointment.  You may see Sherren Mocha, MD or one of the following Advanced Practice Providers on your designated Care Team: . Richardson Dopp, PA-C . Daune Perch, NP  You have been referred to the [facility]. They will call you to schedule an appointment.  Any Other Special Instructions Will Be Listed Below (If Applicable).

## 2019-02-25 NOTE — Telephone Encounter (Signed)
Patrice can we make sure pt is on the PFT list for [facility]. Thank you.

## 2019-02-25 NOTE — Patient Instructions (Addendum)
It is good to see you today. We will do a CXR today to make sure there is no residual pneumonia. We will call you with results Please continue using your torsemide  daily as prescribed  Continue weighing yourself every day  Follow directions of your cardiologist when your weight is up. Follow up labs with cardiology in 1 week. Continue Singulair 10 mg daily Start non-sedating anti histamine with any sign of post nasal drip or allergies. Continue medication for your reflux Remember to try not to eat after 6 pm. We will practice preventative care . We will schedule every 3 month appointments to make sure we can keep a close eye for any flares. ( Nov 2020) Continue wearing oxygen at bedtime at 2 L [place] We will schedule a sleep study to evaluate for Obstructive sleep apnea now that you feel better.  We will schedule you for Pulmonary Function tests as previously ordered. Keep an eye on oxygen saturations , make sure they are always > 88%. If they drop below 88%, wear oxygen at 2-3 L. Follow up with Dr. Valeta Harms in 05/2019 to establish. Prevnar 13  injection today Please contact office for sooner follow up if symptoms do not improve or worsen or seek emergency care

## 2019-02-25 NOTE — Progress Notes (Signed)
History of Present Illness [name] is a 68 y.o. male never smokerwithrecent hospital admission from 10/27/16-11/02/2016 for aspiration pneumonia vs. CAP, and diastolic heart failure . 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 He has oxygen at night for nocturnal desaturations. He was previously followed by Dr. Corrie Waller  [name] has been admitted several times in the last year with hypoxic respiratory failure.  He was admitted in April 2020 with hypoxic respiratory failure in the setting of pneumonia and decompensated heart failure/pulmonary edema.  His hospitalization was complicated by PEA arrest and AKI.  EF was 45-50 by echocardiogram with mild diastolic dysfunction.  He was admitted again in June 2020 with hypoxic respiratory failure (sats reportedly 30% upon presentation) in the setting of hypertensive crisis, pneumonia and decompensated heart failure.  There was also reported syncope.  Follow-up cardiac monitor demonstrated sinus rhythm, PVCs and short runs of NSVT (up to 7 beats).      02/25/2019 Pt. Presents for follow up of telephone visit on 02/16/2019 for upper respiratory infection. At the time he was coughing up thisck yellow green secretions. He was not sure if he had a fever, as he has no thermometer, and it is hard to find thermometers for sale during the Pandemic.He was COVID tested  (Negative) and he was treated with Doxycycline 100 mg  BID x 7 days.  Today at follow up he states his upper respiratory symptoms have resolved. He completed his Doxycycline. He states he has not had any chills. He states he has no secretions. He did have an episode of desaturations after he finished his antibiotics. This resolved quickly after using his oxygen.He has not had any further desaturations since. He denies any wheezing . He denies any shortness of breath. He was seen by cardiology today. They have changed his diuretic from lasix to torsemide. They have also referred him to the wound  clinic for his lower extremity edema. Per his wife he is at his baseline. He states he has done better with his breathing since he has not been eating after 6 pm at night. Saturations are 92-94% on RA today in the office. His HR is elevated at 94. He denies fever, chest pain, he has chronic orthopnea he denies  hemoptysis  Test Results: CXR 02/25/2019  Stable mild bibasilar subsegmental atelectasis or scarring.  CXR 02/11/2019 Resolved consolidative airspace disease. Persistent mild diffuse prominence of the parahilar interstitial markings, favor cephalization of the pulmonary vasculature, without overt pulmonary edema. Mild bibasilar scarring versus atelectasis.  CT Chest 12/25/2018 Large bilateral airspace opacities are noted, most consistent with multifocal pneumonia. 5.1 cm ascending thoracic aortic aneurysm. Recommend semi-annual imaging followup by CTA or MRA and referral to cardiothoracic surgery if not already obtained.  Echocardiogram 10/15/2018 EF 45-50, mild conc LVH, Gr 1 DD, diff HK, normal RVSF, severe MAC, mod calc of AoV, mild AI, Ao root 39, ascending aorta 44 mm  Echocardiogram 06/02/18 EF 45-50, no RWMA, Gr 2 DD, mod AI, mild MR, mild LAE, PASP 35  Myoview 10/11/13 IMPRESSION: 1. No scintigraphic evidence of prior infarction or pharmacologically induced ischemia. 2. Mildly dilated left ventricular cavity with mild global hypokinesia. Ejection fraction - 51%.  Cardiac catheterization 03/22/11 EF 55 LM ok LAD ok OM1 and OM2 30 RCA prox 30    CBC Latest Ref Rng & Units 12/26/2018 12/25/2018 12/25/2018  WBC 4.0 - 10.5 K/uL 12.2(H) 14.3(H) -  Hemoglobin 13.0 - 17.0 g/dL 11.4(L) 11.6(L) 13.3  Hematocrit 39.0 - 52.0 %  35.5(L) 37.5(L) 39.0  Platelets 150 - 400 K/uL 275 268 -    BMP Latest Ref Rng & Units 01/19/2019 12/29/2018 12/27/2018  Glucose 65 - 99 mg/dL 169(H) 220(H) 229(H)  BUN 8 - 27 mg/dL 25 28(H) 28(H)  Creatinine 0.76 - 1.27 mg/dL 1.50(H) 1.33(H)  1.62(H)  BUN/Creat Ratio 10 - 24 17 - -  Sodium 134 - 144 mmol/L 138 139 137  Potassium 3.5 - 5.2 mmol/L 4.5 3.8 3.9  Chloride 96 - 106 mmol/L 94(L) 98 95(L)  CO2 20 - 29 mmol/L _0 Calcium 8.6 - 10.2 mg/dL 9.4 9.0 8.6(L)    BNP    Component Value Date/Time   BNP 172.1 (H) 12/25/2018 0214    ProBNP    Component Value Date/Time   PROBNP 178.3 (H) 10/10/2013 0849    PFT No results found for: FEV1PRE, FEV1POST, FVCPRE, FVCPOST, TLC, DLCOUNC, PREFEV1FVCRT, PSTFEV1FVCRT  Dg Chest 2 View  Result Date: 02/25/2019 CLINICAL DATA:  Pneumonia. EXAM: CHEST - 2 VIEW COMPARISON:  Radiographs of February 11, 2019. FINDINGS: Mild cardiomegaly is noted. No pneumothorax or pleural effusion is noted. Stable mild bibasilar subsegmental atelectasis or scarring is noted. Bony thorax is unremarkable. IMPRESSION: Stable mild bibasilar subsegmental atelectasis or scarring. Electronically Signed   By: Marijo Conception M.D.   On: 02/25/2019 15:50   Dg Chest 2 View  Result Date: 02/11/2019 CLINICAL DATA:  Follow-up pneumonia EXAM: CHEST - 2 VIEW COMPARISON:  12/25/2018 chest radiograph. FINDINGS: Stable cardiomediastinal silhouette with top-normal heart size. No pneumothorax. No pleural effusion. Resolved consolidative airspace disease. Mild diffuse prominence of the parahilar interstitial markings. Mild bibasilar scarring versus atelectasis. Stable peripheral bilateral pleural thickening, right greater than left. IMPRESSION: Resolved consolidative airspace disease. Persistent mild diffuse prominence of the parahilar interstitial markings, favor cephalization of the pulmonary vasculature, without overt pulmonary edema. Mild bibasilar scarring versus atelectasis. Electronically Signed   By: Ilona Sorrel M.D.   On: 02/11/2019 16:26     Past medical hx Past Medical History:  Diagnosis Date  . Blockage of coronary artery of heart ([place]) 10/17/2011   wife states blocked 30%  . Diabetes mellitus 10/17/2011    newly dx today  . Edema leg    right leg has leaky valve and right foot swells  . Emphysema   . Excessive ear wax   . Fatty liver   . Hernia    near navel  . Hyperlipidemia   . Hypertension   . Leaky heart valve   . Neuropathy   . Rheumatic fever   . Slow urinary stream   . TIA (transient ischemic attack)      Social History   Tobacco Use  . Smoking status: Never Smoker  . Smokeless tobacco: Never Used  Substance Use Topics  . Alcohol use: No  . Drug use: No    HaroldWhisnant reports that he has never smoked. He has never used smokeless tobacco. He reports that he does not drink alcohol or use drugs.  Tobacco Cessation: Never smoker with COPD 2/2  Heavy second hand smoke exposure ( per patient history)  Past surgical hx, Family hx, Social hx all reviewed.  Current Outpatient Medications on File Prior to Visit  Medication Sig  . Accu-Chek FastClix Lancets MISC USE UTD  . ACCU-CHEK GUIDE test strip U TO TEST BLOOD SUGAR QD  . albuterol (PROVENTIL HFA;VENTOLIN HFA) 108 (90 Base) MCG/ACT inhaler Inhale 2 puffs into the lungs every 6 (six) hours as needed for wheezing or  shortness of breath.  Marland Kitchen aspirin EC 81 MG tablet Take 81 mg by mouth daily.  Marland Kitchen atorvastatin (LIPITOR) 40 MG tablet Take 1 tablet (40 mg total) by mouth every evening.  . Blood Glucose Monitoring Suppl (ACCU-CHEK GUIDE) w/Device KIT U TO TEST BLOOD SUGAR QD  . carvedilol (COREG) 6.25 MG tablet Take 1.5 tablets (9.375 mg total) by mouth 2 (two) times daily.  . finasteride (PROSCAR) 5 MG tablet Take 5 mg by mouth daily.  Marland Kitchen gabapentin (NEURONTIN) 300 MG capsule Take 300 mg by mouth 4 (four) times daily.   . hydrOXYzine (VISTARIL) 50 MG capsule Take 50 mg by mouth 3 (three) times daily.  . metFORMIN (GLUCOPHAGE) 1000 MG tablet Take 1 tablet by mouth 2 (two) times a day.  . montelukast (SINGULAIR) 10 MG tablet Take 10 mg by mouth at bedtime.  . Multiple Vitamin (MULTIVITAMIN) tablet Take 1 tablet by mouth daily.  .  pantoprazole (PROTONIX) 40 MG tablet Take 1 tablet (40 mg total) by mouth daily.  . tamsulosin (FLOMAX) 0.4 MG CAPS capsule Take 0.4 mg by mouth daily.  Marland Kitchen torsemide (DEMADEX) 20 MG tablet TAKE TORSEMIDE 20 MG TWICE A DAY FOR 3 DAYS, THEN DECREASE TO 20 MG DAILY.   No current facility-administered medications on file prior to visit.      Allergies  Allergen Reactions  . Cashew Nut Oil Palpitations  . Percocet [Oxycodone-Acetaminophen] Other (See Comments)    Hallucintaions    Review Of Systems:  Constitutional:   No  weight loss, night sweats,  Fevers, chills, fatigue, or  lassitude.  HEENT:   No headaches,  Difficulty swallowing,  Tooth/dental problems, or  Sore throat,                No sneezing, itching, ear ache, nasal congestion, post nasal drip,   CV:  No chest pain,  Orthopnea, PND, +  Chronic swelling in lower extremities, No anasarca, dizziness, palpitations, syncope.   GI  No heartburn, indigestion, abdominal pain, nausea, vomiting, diarrhea, change in bowel habits, loss of appetite, bloody stools.   Resp: + shortness of breath with exertion less at rest.  No excess mucus, no productive cough,  No non-productive cough,  No coughing up of blood.  No change in color of mucus.  No wheezing.  No chest wall deformity  Skin: no rash or lesions.+ cellulitis BLE  GU: no dysuria, change in color of urine, no urgency or frequency.  No flank pain, no hematuria   MS:  No joint pain or swelling.  No decreased range of motion.  No back pain.  Psych:  No change in mood or affect. No depression or anxiety.  No memory loss.   Vital Signs BP 118/62 (BP Location: Right Arm, Cuff Size: Normal)   Pulse (!) 111   Temp 98.4 F (36.9 C) (Oral)   Ht _0  (1.702 m)   Wt 225 lb (102.1 kg)   SpO2 92%   BMI 35.24 kg/m    Physical Exam:  General- No distress,  A&Ox3, pleasant ENT: No sinus tenderness, TM clear, pale nasal mucosa, no oral exudate,no post nasal drip, no LAN Cardiac: S1,  S2, regular rate and rhythm, no murmur Chest: No wheeze/ rales/ dullness; no accessory muscle use, no nasal flaring, no sternal retractions Abd.: Soft Non-tender, ND, BS +, Body mass index is 35.24 kg/m. Ext: No clubbing, cyanosis,+ BLE edema  Neuro:  normal strength, MAE x 4, A&O x 3, pleasant Skin: No rashes, No lesions,  warm and dry Psych: normal mood and behavior   Assessment/Plan  Lobar pneumonia ([place]) Resolved per CXR 02/11/2019 New upper respiratory symptoms 02/16/2019>> Treated with Doxycycline 100 mg BID  x 7 days Close follow up for prevention especially in the spring and fall when allergies could be a contributing factor. Follow up in 3 months with Dr. Valeta Harms [facility] NP  Acute hypoxic and hypercarbic respiratory failure ([place]) in setting of bilateral pulmonary infiltrates. Presume CAP + pulmonary edema +/-ALI Resolved post hospitalization Saturations on RA 02/25/2019   Were 92-94% PFT's as previously ordered Wife  Reminded that sats need to be > 88% at all times, if they drop below he needs to wear his oxygen Follow up in 3 months  Acute on chronic combined systolic and diastolic CHF (congestive heart failure) ([place]) Diuretic changed today 8/26  to torsemide per cards Follow up with cards with close monitoring of his renal function, and daily weights with a plan for days his weight is up. He has been referred to the heart failure clinic  Seasonal allergies ? May be contributing to frequent recurrent pneumonia ? Aspirating secretions while he sleeps Plan Continue Singulair 10 mg daily Continue  non-sedating anti histamine with any sign of post nasal drip or allergies. Frequent follow up every 3 months as preventative and to promote proactive OP therapy as to avoid inpatient admission requirements   Health care maintenance Does not take flu vaccine Pneumococcal vaccine 01/2018 Prevnar 13 on 02/25/2019  GERD (gastroesophageal reflux disease) Continue Protonix 40 mg  once daily as you have been doing Remember to try not to eat after 6 pm.  Nocturnal hypoxemia Continue wearing oxygen at 2 L [place] at bedtime as you have been doing  OSA (obstructive sleep apnea) Concern for OSA Per wife patient snored Plan Sleep study for evaluation for OSA  Thoracic aortic aneurysm ([employer]) 5.1 cm on CT 12/2018 Referred to [facility] for semi annual scanning to monitor for progression   This appointment was 40 min longwith over 50% of the time in direct face-to-face patient care, assessment, plan of care, and follow-up.   Magdalen Spatz, NP 02/25/2019  4:36 PM

## 2019-03-06 ENCOUNTER — Other Ambulatory Visit: Payer: Medicare Other | Admitting: *Deleted

## 2019-03-06 ENCOUNTER — Other Ambulatory Visit: Payer: Self-pay

## 2019-03-06 DIAGNOSIS — N183 Chronic kidney disease, stage 3 unspecified: Secondary | ICD-10-CM

## 2019-03-06 DIAGNOSIS — I5042 Chronic combined systolic (congestive) and diastolic (congestive) heart failure: Secondary | ICD-10-CM

## 2019-03-06 DIAGNOSIS — I10 Essential (primary) hypertension: Secondary | ICD-10-CM

## 2019-03-06 DIAGNOSIS — I351 Nonrheumatic aortic (valve) insufficiency: Secondary | ICD-10-CM

## 2019-03-06 DIAGNOSIS — I7121 Aneurysm of the ascending aorta, without rupture: Secondary | ICD-10-CM

## 2019-03-06 DIAGNOSIS — J449 Chronic obstructive pulmonary disease, unspecified: Secondary | ICD-10-CM

## 2019-03-06 DIAGNOSIS — I712 Thoracic aortic aneurysm, without rupture: Secondary | ICD-10-CM

## 2019-03-06 LAB — BASIC METABOLIC PANEL
BUN/Creatinine Ratio: 25 — ABNORMAL HIGH (ref 10–24)
BUN: 35 mg/dL — ABNORMAL HIGH (ref 8–27)
CO2: 27 mmol/L (ref 20–29)
Calcium: 9 mg/dL (ref 8.6–10.2)
Chloride: 96 mmol/L (ref 96–106)
Creatinine, Ser: 1.39 mg/dL — ABNORMAL HIGH (ref 0.76–1.27)
GFR calc Af Amer: 59 mL/min/{1.73_m2} — ABNORMAL LOW (ref >59)
GFR calc non Af Amer: 51 mL/min/{1.73_m2} — ABNORMAL LOW (ref >59)
Glucose: 182 mg/dL — ABNORMAL HIGH (ref 65–99)
Potassium: 4.3 mmol/L (ref 3.5–5.2)
Sodium: 142 mmol/L (ref 134–144)

## 2019-03-10 ENCOUNTER — Encounter: Payer: Self-pay | Admitting: Physician Assistant

## 2019-03-10 ENCOUNTER — Other Ambulatory Visit: Payer: Self-pay

## 2019-03-10 ENCOUNTER — Ambulatory Visit (INDEPENDENT_AMBULATORY_CARE_PROVIDER_SITE_OTHER): Payer: Medicare Other | Admitting: Physician Assistant

## 2019-03-10 ENCOUNTER — Telehealth: Payer: Self-pay | Admitting: *Deleted

## 2019-03-10 VITALS — BP 138/70 | HR 57 | Ht 67.0 in | Wt 226.4 lb

## 2019-03-10 DIAGNOSIS — I472 Ventricular tachycardia: Secondary | ICD-10-CM | POA: Diagnosis not present

## 2019-03-10 DIAGNOSIS — I7121 Aneurysm of the ascending aorta, without rupture: Secondary | ICD-10-CM

## 2019-03-10 DIAGNOSIS — I4729 Other ventricular tachycardia: Secondary | ICD-10-CM

## 2019-03-10 DIAGNOSIS — I5042 Chronic combined systolic (congestive) and diastolic (congestive) heart failure: Secondary | ICD-10-CM

## 2019-03-10 DIAGNOSIS — I712 Thoracic aortic aneurysm, without rupture: Secondary | ICD-10-CM | POA: Diagnosis not present

## 2019-03-10 DIAGNOSIS — N183 Chronic kidney disease, stage 3 unspecified: Secondary | ICD-10-CM

## 2019-03-10 DIAGNOSIS — I10 Essential (primary) hypertension: Secondary | ICD-10-CM

## 2019-03-10 MED ORDER — POTASSIUM CHLORIDE CRYS ER 10 MEQ PO TBCR: 10.0000 meq | EXTENDED_RELEASE_TABLET | Freq: Two times a day (BID) | ORAL | 6 refills | Status: DC

## 2019-03-10 MED ORDER — TORSEMIDE 20 MG PO TABS: 40.0000 mg | ORAL_TABLET | Freq: Two times a day (BID) | ORAL | 6 refills | Status: DC

## 2019-03-10 NOTE — Progress Notes (Signed)
Cardiology Office Note:    Date:  03/10/2019   ID:  [name], DOB [birth year], MRN [id number]  PCP:  Clinic, Medical, MD (Inactive)  Cardiologist:  Sherren Mocha, MD  Electrophysiologist:  None   Referring MD: No ref. provider found   Chief Complaint  Patient presents with  . Follow-up    CHF    History of Present Illness:    [name] is a 68 y.o. male with:  - Coronary artery disease  - Non-obs by Cath in 2012 - Myoview 4/15: no ischemia - Combined Systolic and Diastolic CHF - Echocardiogram 10/2018: EF 45-50 - AAA - Thoracic aortic aneurysm - CT 12/2018: 5.1 cm >> eval by [facility] (Dr. Orvan Seen) >> poor candidate for repair; no need for serial CTs - Mild aortic insufficiency  - Hypertension  - Hyperlipidemia  - Diabetes mellitus 2 - Chronic kidney disease 3 - Probable COPD - Hx of recurrent pneumonia       - s/p multiple admx for hypoxic resp failure - S/p prior PEA arrest 10/2018 - Hx of TIA   [name] has been admitted several times in the last year with hypoxic respiratory failure.  He was admitted in April 2020 with hypoxic respiratory failure in the setting of pneumonia and decompensated heart failure/pulmonary edema.  His hospitalization was complicated by PEA arrest and AKI.  EF was 45-50 by echocardiogram with mild diastolic dysfunction.  He was admitted again in June 2020 with hypoxic respiratory failure (sats reportedly 30% upon presentation) in the setting of hypertensive crisis, pneumonia and decompensated heart failure.  There was also reported syncope.  Follow-up cardiac monitor demonstrated sinus rhythm, PVCs and short runs of NSVT (up to 7 beats).    I saw him on 02/25/2019.  He had recently been placed on antibiotics for recurrent pneumonia.  He remained volume overloaded when I saw him .  Of note, he has been [facility].  He has massive LE edema.  I changed his Furosemide to Torsemide.  I also adjusted his beta-blocker due to NSVT  noted on recent event monitor.    He returns for follow-up.  He is here with his wife.  Since last seen, he notes his leg swelling is somewhat worse.  His weight did go down when he was taking torsemide twice a day for 3 days.  He has not really noticed any worsening shortness of breath.  He has not had any chest pain.  He has not had syncope.  His legs continue to weep.  He never got an appointment with the wound clinic.  Prior CV studies:   The following studies were reviewed today:  Cardiac Monitor 02/11/2019 1. The basic rhythm is normal sinus with an average heart rate of 78 bpm 2. There are PVCs and short runs of nonsustained VT up to 7 beats 3. No evidence of atrial fibrillation, atrial flutter, or supraventricular arrhythmia 4. No bradycardic events or pathologic pauses 5. Intermittent bundle branch block  Chest CT 12/25/2018 IMPRESSION: Large bilateral airspace opacities are noted, most consistent with multifocal pneumonia.  5.1 cm ascending thoracic aortic aneurysm. Recommend semi-annual imaging followup by CTA or MRA and referral to cardiothoracic surgery if not already obtained. This recommendation follows 2010 ACCF/AHA/AATS/ACR/ASA/SCA/SCAI/SIR/STS/SVM Guidelines for the Diagnosis and Management of Patients With Thoracic Aortic Disease. Circulation. 2010; 121: G315-V761. Aortic aneurysm NOS (ICD10-I71.9).  Aortic Atherosclerosis (ICD10-I70.0).   Echocardiogram 10/15/2018 EF 45-50, mild conc LVH, Gr 1 DD, diff HK, normal RVSF, severe  MAC, mod calc of AoV, mild AI, Ao root 39, ascending aorta 44 mm  Echocardiogram 06/02/18 EF 45-50, no RWMA, Gr 2 DD, mod AI, mild MR, mild LAE, PASP 35  Myoview 10/11/13 IMPRESSION: 1. No scintigraphic evidence of prior infarction or pharmacologically induced ischemia. 2. Mildly dilated left ventricular cavity with mild global hypokinesia. Ejection fraction - 51%.  Cardiac catheterization 03/22/11 EF 55 LM ok LAD ok OM1  and OM2 30 RCA prox 30   Past Medical History:  Diagnosis Date  . Blockage of coronary artery of heart ([place]) 10/17/2011   wife states blocked 30%  . Diabetes mellitus 10/17/2011   newly dx today  . Edema leg    right leg has leaky valve and right foot swells  . Emphysema   . Excessive ear wax   . Fatty liver   . Hernia    near navel  . Hyperlipidemia   . Hypertension   . Leaky heart valve   . Neuropathy   . Rheumatic fever   . Slow urinary stream   . TIA (transient ischemic attack)    Surgical Hx: The patient  has a past surgical history that includes Lithotripsy and Circumcision.   Current Medications: Current Meds  Medication Sig  . Accu-Chek FastClix Lancets MISC USE UTD  . ACCU-CHEK GUIDE test strip U TO TEST BLOOD SUGAR QD  . albuterol (PROVENTIL HFA;VENTOLIN HFA) 108 (90 Base) MCG/ACT inhaler Inhale 2 puffs into the lungs every 6 (six) hours as needed for wheezing or shortness of breath.  Marland Kitchen aspirin EC 81 MG tablet Take 81 mg by mouth daily.  Marland Kitchen atorvastatin (LIPITOR) 40 MG tablet Take 1 tablet (40 mg total) by mouth every evening.  . Blood Glucose Monitoring Suppl (ACCU-CHEK GUIDE) w/Device KIT U TO TEST BLOOD SUGAR QD  . carvedilol (COREG) 6.25 MG tablet Take 1.5 tablets (9.375 mg total) by mouth 2 (two) times daily.  . finasteride (PROSCAR) 5 MG tablet Take 5 mg by mouth daily.  Marland Kitchen gabapentin (NEURONTIN) 300 MG capsule Take 300 mg by mouth 4 (four) times daily.   . hydrOXYzine (VISTARIL) 50 MG capsule Take 50 mg by mouth 3 (three) times daily.  . metFORMIN (GLUCOPHAGE) 1000 MG tablet Take 1 tablet by mouth 2 (two) times a day.  . montelukast (SINGULAIR) 10 MG tablet Take 10 mg by mouth at bedtime.  . Multiple Vitamin (MULTIVITAMIN) tablet Take 1 tablet by mouth daily.  . pantoprazole (PROTONIX) 40 MG tablet Take 1 tablet (40 mg total) by mouth daily.  . tamsulosin (FLOMAX) 0.4 MG CAPS capsule Take 0.4 mg by mouth daily.  Marland Kitchen torsemide (DEMADEX) 20 MG tablet Take 2  tablets (40 mg total) by mouth 2 (two) times daily.  . [DISCONTINUED] torsemide (DEMADEX) 20 MG tablet TAKE TORSEMIDE 20 MG TWICE A DAY FOR 3 DAYS, THEN DECREASE TO 20 MG DAILY.     Allergies:   Cashew nut oil and Percocet [oxycodone-acetaminophen]   Social History   Tobacco Use  . Smoking status: Never Smoker  . Smokeless tobacco: Never Used  Substance Use Topics  . Alcohol use: No  . Drug use: No     Family Hx: The patient's family history includes Aneurysm in his mother; Coronary artery disease in his brother, brother, and sister; Emphysema in his father and mother.  ROS:   Please see the history of present illness.    ROS All other systems reviewed and are negative.   EKGs/Labs/Other Test Reviewed:    EKG:  EKG is not ordered today.  The ekg ordered today demonstrates N/a  Recent Labs: 12/25/2018: ALT 31; B Natriuretic Peptide 172.1; TSH 2.029 12/26/2018: Hemoglobin 11.4; Magnesium 2.0; Platelets 275 03/06/2019: BUN 35; Creatinine, Ser 1.39; Potassium 4.3; Sodium 142   Recent Lipid Panel Lab Results  Component Value Date/Time   CHOL 165 10/16/2018 04:52 AM   TRIG 204 (H) 10/16/2018 04:52 AM   HDL 33 (L) 10/16/2018 04:52 AM   CHOLHDL 5.0 10/16/2018 04:52 AM   LDLCALC 91 10/16/2018 04:52 AM    Physical Exam:    VS:  BP 138/70   Pulse (!) 57   Ht 5' 7" (1.702 m)   Wt 226 lb 6.4 oz (102.7 kg)   SpO2 91%   BMI 35.46 kg/m     Wt Readings from Last 3 Encounters:  03/10/19 226 lb 6.4 oz (102.7 kg)  02/25/19 225 lb (102.1 kg)  02/25/19 223 lb 12.8 oz (101.5 kg)     Physical Exam  Constitutional: He is oriented to person, place, and time. He appears well-developed and well-nourished. No distress.  HENT:  Head: Normocephalic and atraumatic.  Eyes: No scleral icterus.  Neck: No thyromegaly present.  Cardiovascular: Normal rate and regular rhythm.  Murmur heard.  Systolic murmur is present with a grade of 2/6 at the upper right sternal border and upper left  sternal border. Pulmonary/Chest: Effort normal and breath sounds normal. He has no rales.  Abdominal: Soft. He exhibits distension. There is no hepatomegaly.  Musculoskeletal:        General: Edema (massive brawny edema bilat; weeping) present.  Lymphadenopathy:    He has no cervical adenopathy.  Neurological: He is alert and oriented to person, place, and time.  Skin: Skin is warm and dry.  Psychiatric: He has a normal mood and affect.    ASSESSMENT & PLAN:    1. Chronic combined systolic and diastolic CHF  EF 16-10.  He primarily has diastolic CHF.  He mainly has massive bilateral edema.  I placed him on torsemide at last visit.  I gave him a low dose given his prior history of worsening creatinine with furosemide in the hospital.  His renal function is tolerated this well.  He did have slightly improved swelling with torsemide 20 mg twice a day.  I gave him the option of admission to the hospital versus continuing to increase his outpatient therapy.  His wife would like to avoid admission due to the limited funds.  As his lungs are clear and he really has not noticed worsening shortness of breath, I think it is somewhat reasonable to consider increasing his outpatient therapy.   -Increase torsemide to 40 mg twice daily.  -Add potassium 10 equivalents daily  -BMET, CBC today  -BMET 1 week  -Follow-up with me in 1 to 2 weeks  -If he has no improvement with adjusting torsemide, he will need admission for IV diuresis  -Continue to try to arrange wound care referral  2. Chronic kidney disease Obtain BMET today and repeat in 1 week.  3. NSVT Continue beta-blocker therapy  4. Thoracic aortic aneurysm He is not a surgical candidate.  Continue beta-blocker.  5. Essential hypertension The patient's blood pressure is controlled on his current regimen.  Continue current therapy.    Dispo:  No follow-ups on file.   Medication Adjustments/Labs and Tests Ordered: Current medicines are  reviewed at length with the patient today.  Concerns regarding medicines are outlined above.  Tests Ordered: Orders Placed This  Encounter  Procedures  . Basic Metabolic Panel (BMET)  . Basic Metabolic Panel (BMET)  . CBC w/Diff   Medication Changes: Meds ordered this encounter  Medications  . torsemide (DEMADEX) 20 MG tablet    Sig: Take 2 tablets (40 mg total) by mouth 2 (two) times daily.    Dispense:  120 tablet    Refill:  6  . potassium chloride (K-DUR) 10 MEQ tablet    Sig: Take 1 tablet (10 mEq total) by mouth 2 (two) times daily.    Dispense:  60 tablet    Refill:  6    Signed, Richardson Dopp, PA-C  03/10/2019 5:08 PM    [facility] [address], [place], [place]  [address] Phone: [phone]; Fax: [phone]

## 2019-03-10 NOTE — Telephone Encounter (Signed)
S/w pt's wife per (DPR) move pt's appt time up today to 3:15 per Richardson Dopp, PA.

## 2019-03-10 NOTE — Patient Instructions (Addendum)
Medication Instructions:  Your physician has recommended you make the following change in your medication:  1. Increase torsemide two tablets (40 mg ) twice daily 2. Start Kdur one tablet (10 meq) twice daily.  Sent in today to pharmacy.    Labwork: Your physician recommends that you have lab work today: bmet/cbc Your physician recommends that you return for lab work in one week on Wednesday, September 16 between 8-5    Testing/Procedures: -None  Follow-Up: Your physician recommends that you keep your scheduled  follow-up appointment with Richardson Dopp, PA-C   Any Other Special Instructions Will Be Listed Below (If Applicable).  When you come in for labs next week let lab tech know how your legs are doing you may be admitted to the hospital.  I called wound center today and no answer will try tomorrow and let you know.    If you need a refill on your cardiac medications before your next appointment, please call your pharmacy.

## 2019-03-11 ENCOUNTER — Telehealth: Payer: Self-pay | Admitting: *Deleted

## 2019-03-11 LAB — CBC WITH DIFFERENTIAL/PLATELET
Basophils Absolute: 0.1 10*3/uL (ref 0.0–0.2)
Basos: 1 %
EOS (ABSOLUTE): 0.3 10*3/uL (ref 0.0–0.4)
Eos: 3 %
Hematocrit: 32.7 % — ABNORMAL LOW (ref 37.5–51.0)
Hemoglobin: 10.7 g/dL — ABNORMAL LOW (ref 13.0–17.7)
Immature Grans (Abs): 0 10*3/uL (ref 0.0–0.1)
Immature Granulocytes: 0 %
Lymphocytes Absolute: 1.2 10*3/uL (ref 0.7–3.1)
Lymphs: 14 %
MCH: 27.2 pg (ref 26.6–33.0)
MCHC: 32.7 g/dL (ref 31.5–35.7)
MCV: 83 fL (ref 79–97)
Monocytes Absolute: 0.4 10*3/uL (ref 0.1–0.9)
Monocytes: 5 %
Neutrophils Absolute: 6.3 10*3/uL (ref 1.4–7.0)
Neutrophils: 77 %
Platelets: 315 10*3/uL (ref 150–450)
RBC: 3.93 x10E6/uL — ABNORMAL LOW (ref 4.14–5.80)
RDW: 13.6 % (ref 11.6–15.4)
WBC: 8.3 10*3/uL (ref 3.4–10.8)

## 2019-03-11 LAB — BASIC METABOLIC PANEL
BUN/Creatinine Ratio: 19 (ref 10–24)
BUN: 27 mg/dL (ref 8–27)
CO2: 26 mmol/L (ref 20–29)
Calcium: 8.7 mg/dL (ref 8.6–10.2)
Chloride: 99 mmol/L (ref 96–106)
Creatinine, Ser: 1.4 mg/dL — ABNORMAL HIGH (ref 0.76–1.27)
GFR calc Af Amer: 59 mL/min/{1.73_m2} — ABNORMAL LOW (ref >59)
GFR calc non Af Amer: 51 mL/min/{1.73_m2} — ABNORMAL LOW (ref >59)
Glucose: 190 mg/dL — ABNORMAL HIGH (ref 65–99)
Potassium: 4.4 mmol/L (ref 3.5–5.2)
Sodium: 137 mmol/L (ref 134–144)

## 2019-03-11 NOTE — Telephone Encounter (Signed)
Called wound center yesterday in [place] due to when order was placed in system  [facility] called pt to schedule.  Pt lives in [place].  The office was closed.  Order placed in system correctly. Called wound center @ [phone] and s/w Nicole Kindred who stated usually schedules all pts and does not know why pt was called from [place]. Scheduled pt for Sept 23 @ 10:30.  S/w pt's wife and is aware of appt date and time.

## 2019-03-17 ENCOUNTER — Other Ambulatory Visit: Payer: Medicare Other

## 2019-03-18 ENCOUNTER — Other Ambulatory Visit: Payer: Self-pay

## 2019-03-18 ENCOUNTER — Other Ambulatory Visit: Payer: Medicare Other | Admitting: *Deleted

## 2019-03-18 DIAGNOSIS — I5042 Chronic combined systolic (congestive) and diastolic (congestive) heart failure: Secondary | ICD-10-CM

## 2019-03-19 ENCOUNTER — Telehealth: Payer: Self-pay

## 2019-03-19 DIAGNOSIS — N289 Disorder of kidney and ureter, unspecified: Secondary | ICD-10-CM

## 2019-03-19 LAB — BASIC METABOLIC PANEL
BUN/Creatinine Ratio: 22 (ref 10–24)
BUN: 43 mg/dL — ABNORMAL HIGH (ref 8–27)
CO2: 27 mmol/L (ref 20–29)
Calcium: 9.4 mg/dL (ref 8.6–10.2)
Chloride: 95 mmol/L — ABNORMAL LOW (ref 96–106)
Creatinine, Ser: 1.95 mg/dL — ABNORMAL HIGH (ref 0.76–1.27)
GFR calc Af Amer: 39 mL/min/{1.73_m2} — ABNORMAL LOW (ref >59)
GFR calc non Af Amer: 34 mL/min/{1.73_m2} — ABNORMAL LOW (ref >59)
Glucose: 221 mg/dL — ABNORMAL HIGH (ref 65–99)
Potassium: 4.9 mmol/L (ref 3.5–5.2)
Sodium: 139 mmol/L (ref 134–144)

## 2019-03-19 MED ORDER — TORSEMIDE 20 MG PO TABS: ORAL_TABLET | ORAL | 3 refills | Status: DC

## 2019-03-19 MED ORDER — POTASSIUM CHLORIDE ER 10 MEQ PO TBCR: 10.0000 meq | EXTENDED_RELEASE_TABLET | Freq: Every day | ORAL | 3 refills | Status: DC

## 2019-03-19 NOTE — Telephone Encounter (Signed)
-----   Message from Liliane Shi, PA-C sent at 03/19/2019  8:30 AM EDT ----- Creatinine has increased some.  K+ normal.   PLAN:   -Decrease Torsemide to 40 mg in A and 20 mg in P  -Decrease K+ to 10 mEq once daily   -Repeat BMET at f/u appt with me next week Richardson Dopp, PA-C    03/19/2019 8:18 AM

## 2019-03-19 NOTE — Telephone Encounter (Signed)
Notes recorded by Frederik Schmidt, RN on 03/19/2019 at 9:08 AM EDT  The patient has been notified of the result and verbalized understanding. All questions (if any) were answered.  Frederik Schmidt, RN 03/19/2019 9:08 AM

## 2019-03-23 NOTE — Telephone Encounter (Signed)
Noted. Patrice aware PFT needs scheduling.  

## 2019-03-24 ENCOUNTER — Encounter: Payer: Self-pay | Admitting: Physician Assistant

## 2019-03-24 ENCOUNTER — Ambulatory Visit (INDEPENDENT_AMBULATORY_CARE_PROVIDER_SITE_OTHER): Payer: Medicare Other | Admitting: Physician Assistant

## 2019-03-24 ENCOUNTER — Other Ambulatory Visit: Payer: Medicare Other

## 2019-03-24 ENCOUNTER — Other Ambulatory Visit: Payer: Self-pay

## 2019-03-24 VITALS — BP 116/64 | HR 87 | Ht 67.0 in | Wt 223.0 lb

## 2019-03-24 DIAGNOSIS — N183 Chronic kidney disease, stage 3 unspecified: Secondary | ICD-10-CM

## 2019-03-24 DIAGNOSIS — I2510 Atherosclerotic heart disease of native coronary artery without angina pectoris: Secondary | ICD-10-CM

## 2019-03-24 DIAGNOSIS — N289 Disorder of kidney and ureter, unspecified: Secondary | ICD-10-CM

## 2019-03-24 DIAGNOSIS — I5042 Chronic combined systolic (congestive) and diastolic (congestive) heart failure: Secondary | ICD-10-CM | POA: Diagnosis not present

## 2019-03-24 DIAGNOSIS — I10 Essential (primary) hypertension: Secondary | ICD-10-CM

## 2019-03-24 DIAGNOSIS — I712 Thoracic aortic aneurysm, without rupture: Secondary | ICD-10-CM

## 2019-03-24 DIAGNOSIS — I7121 Aneurysm of the ascending aorta, without rupture: Secondary | ICD-10-CM

## 2019-03-24 NOTE — Progress Notes (Signed)
Cardiology Office Note:    Date:  03/24/2019   ID:  [name], DOB 02-01-1951, MRN [id number]  PCP:  [facility]inic, Medical, MD (Inactive)  Cardiologist:  Sherren Mocha, MD  Electrophysiologist:  None   Referring MD: No ref. provider found   Chief Complaint  Patient presents with   Follow-up    CHF    History of Present Illness:    [name] is a 68 y.o. male with: -Coronary artery disease -Non-obs by Cath in 2012 - Myoview 4/15: no ischemia -Combined Systolic and Diastolic CHF -[id number]: EF 45-50 -AAA -Thoracic aortic aneurysm -CT 12/2018: 5.1 cm >> eval by [facility] (Dr. Orvan Seen) >> poor candidate for repair; no need for serial CTs -Mildaortic insufficiency -Hypertension -Hyperlipidemia -Diabetes mellitus 2 -Chronic kidney disease3 -Probable COPD -Hx of recurrent pneumonia - s/p multiple admx for hypoxic resp failure -S/p prior PEA arrest 10/2018 -Hx of TIA  [name] has been admitted several times in the last year with hypoxic respiratory failure. He was admitted in April 2020 with hypoxic respiratory failure in the setting of pneumonia and decompensated heart failure/pulmonary edema. His hospitalization was complicated by PEA arrest and AKI. EF was 45-50 by echocardiogram with mild diastolic dysfunction. He was admitted again in June 2020 with hypoxic respiratory failure (sats reportedly 30% upon presentation) in the setting of hypertensive crisis, pneumonia and decompensated heart failure. There was also reported syncope. Follow-up cardiac monitor demonstrated sinus rhythm, PVCs and short runs of NSVT (up to 7 beats).   I have seen him quite frequently in the last few weeks due to persistent volume excess.  I adjusted his Torsemide further at his last visit on 03/10/2019.  He returns for close follow up.   He is here with his wife.  His weight decreased at home from 230 lbs >> 218 lbs.  Today, it went back up to 220 lbs.  He feels  his swelling is improved.  He has not noticed as much weeping.  He has a wound clinic appointment soon.  He feels his shortness of breath is stable.  He sleeps in a recliner.  He has not had chest pain, syncope.    Prior CV studies:   The following studies were reviewed today:  Cardiac Monitor 02/11/2019 1. The basic rhythm is normal sinus with an average heart rate of 78 bpm 2. There are PVCs and short runs of nonsustained VT up to 7 beats 3. No evidence of atrial fibrillation, atrial flutter, or supraventricular arrhythmia 4. No bradycardic events or pathologic pauses 5. Intermittent bundle branch block  Chest CT 12/25/2018 IMPRESSION: Large bilateral airspace opacities are noted, most consistent with multifocal pneumonia.  5.1 cm ascending thoracic aortic aneurysm. Recommend semi-annual imaging followup by CTA or MRA and referral to cardiothoracic surgery if not already obtained. This recommendation follows 2010 ACCF/AHA/AATS/ACR/ASA/SCA/SCAI/SIR/STS/SVM Guidelines for the Diagnosis and Management of Patients With Thoracic Aortic Disease. Circulation. 2010; 121: Z329-J242. Aortic aneurysm NOS (ICD10-I71.9).  Aortic Atherosclerosis (ICD10-I70.0).   Echocardiogram 10/15/2018 EF 45-50, mild conc LVH, Gr 1 DD, diff HK, normal RVSF, severe MAC, mod calc of AoV, mild AI, Ao root 39, ascending aorta 44 mm  Echocardiogram 06/02/18 EF 45-50, no RWMA, Gr 2 DD, mod AI, mild MR, mild LAE, PASP 35  Myoview 10/11/13 IMPRESSION: 1. No scintigraphic evidence of prior infarction or pharmacologically induced ischemia. 2. Mildly dilated left ventricular cavity with mild global hypokinesia. Ejection fraction - 51%.  Cardiac catheterization 03/22/11 EF 55 LM ok LAD ok OM1 and OM2 30  RCA prox 30  Past Medical History:  Diagnosis Date   Blockage of coronary artery of heart ([place]) 10/17/2011   wife states blocked 30%   Diabetes mellitus 10/17/2011   newly dx today   Edema leg     right leg has leaky valve and right foot swells   Emphysema    Excessive ear wax    Fatty liver    Hernia    near navel   Hyperlipidemia    Hypertension    Leaky heart valve    Neuropathy    Rheumatic fever    Slow urinary stream    TIA (transient ischemic attack)    Surgical Hx: The patient  has a past surgical history that includes Lithotripsy and Circumcision.   Current Medications: Current Meds  Medication Sig   Accu-Chek FastClix Lancets MISC USE UTD   ACCU-CHEK GUIDE test strip U TO TEST BLOOD SUGAR QD   albuterol (PROVENTIL HFA;VENTOLIN HFA) 108 (90 Base) MCG/ACT inhaler Inhale 2 puffs into the lungs every 6 (six) hours as needed for wheezing or shortness of breath.   aspirin EC 81 MG tablet Take 81 mg by mouth daily.   atorvastatin (LIPITOR) 40 MG tablet Take 1 tablet (40 mg total) by mouth every evening.   Blood Glucose Monitoring Suppl (ACCU-CHEK GUIDE) w/Device KIT U TO TEST BLOOD SUGAR QD   carvedilol (COREG) 6.25 MG tablet Take 1.5 tablets (9.375 mg total) by mouth 2 (two) times daily.   finasteride (PROSCAR) 5 MG tablet Take 5 mg by mouth daily.   gabapentin (NEURONTIN) 300 MG capsule Take 300 mg by mouth 4 (four) times daily.    hydrOXYzine (VISTARIL) 50 MG capsule Take 50 mg by mouth 3 (three) times daily.   metFORMIN (GLUCOPHAGE) 1000 MG tablet Take 1 tablet by mouth 2 (two) times a day.   montelukast (SINGULAIR) 10 MG tablet Take 10 mg by mouth at bedtime.   Multiple Vitamin (MULTIVITAMIN) tablet Take 1 tablet by mouth daily.   pantoprazole (PROTONIX) 40 MG tablet Take 1 tablet (40 mg total) by mouth daily.   potassium chloride (K-DUR) 10 MEQ tablet Take 1 tablet (10 mEq total) by mouth daily.   tamsulosin (FLOMAX) 0.4 MG CAPS capsule Take 0.4 mg by mouth daily.   torsemide (DEMADEX) 20 MG tablet Take 40 mg (2 x 20 mg tablets) in the AM and 20 mg (1 tablet) in the PM     Allergies:   Cashew nut oil and Percocet  [oxycodone-acetaminophen]   Social History   Tobacco Use   Smoking status: Never Smoker   Smokeless tobacco: Never Used  Substance Use Topics   Alcohol use: No   Drug use: No     Family Hx: The patient's family history includes Aneurysm in his mother; Coronary artery disease in his brother, brother, and sister; Emphysema in his father and mother.  ROS:   Please see the history of present illness.    ROS All other systems reviewed and are negative.   EKGs/Labs/Other Test Reviewed:    EKG:  EKG is not ordered today.  The ekg ordered today demonstrates n/a  Recent Labs: 12/25/2018: ALT 31; B Natriuretic Peptide 172.1; TSH 2.029 12/26/2018: Magnesium 2.0 03/10/2019: Hemoglobin 10.7; Platelets 315 03/18/2019: BUN 43; Creatinine, Ser 1.95; Potassium 4.9; Sodium 139   Recent Lipid Panel Lab Results  Component Value Date/Time   CHOL 165 10/16/2018 04:52 AM   TRIG 204 (H) 10/16/2018 04:52 AM   HDL 33 (L) 10/16/2018 04:52 AM  CHOLHDL 5.0 10/16/2018 04:52 AM   LDLCALC 91 10/16/2018 04:52 AM    Physical Exam:    VS:  BP 116/64    Pulse 87    Ht _0  (1.702 m)    Wt 223 lb (101.2 kg)    SpO2 95%    BMI 34.93 kg/m     Wt Readings from Last 3 Encounters:  03/24/19 223 lb (101.2 kg)  03/10/19 226 lb 6.4 oz (102.7 kg)  02/25/19 225 lb (102.1 kg)     Physical Exam  Constitutional: He is oriented to person, place, and time. He appears well-developed and well-nourished. No distress.  HENT:  Head: Normocephalic and atraumatic.  Eyes: No scleral icterus.  Neck: No thyromegaly present.  Cardiovascular: Normal rate and regular rhythm.  Murmur heard.  Early systolic murmur is present with a grade of 2/6 at the lower left sternal border. Pulmonary/Chest: Effort normal and breath sounds normal. He has no rales.  Abdominal: Soft. There is no hepatomegaly.  Musculoskeletal:        General: Edema (massive bilat LE edema below the knee; brawny - improved from last visit; no weeping  noted) present.  Lymphadenopathy:    He has no cervical adenopathy.  Neurological: He is alert and oriented to person, place, and time.  Skin: Skin is warm and dry.  Psychiatric: He has a normal mood and affect.    ASSESSMENT & PLAN:    1. Chronic combined systolic and diastolic CHF  EF 28-41.  Primarily diastolic and more R sided than L sided CHF.  His edema is improved.  However, he still has a ways to go.  He has an appt with the wound clinic soon.  Hopefully, his legs can be wrapped which should help some.  Continue current management.  He will see Dr. Aundra Dubin in the [facility] 04/03/2019.  Arrange follow up with Dr. Burt Knack or me in 3 mos.    2. [name] (chronic kidney disease) stage 3  Creatinine increased with the higher dose Torsemide.  He is now on 40 mg in A and 20 mg in P.  BMET will be obtained today to recheck creatinine.   3. Essential hypertension The patient's blood pressure is controlled on his current regimen.  Continue current therapy.     4. Coronary artery disease  Non obstructive by cath in 2012 and low risk Myoview in 2015.  No angina.  Continue ASA, statin.    5. Thoracic aortic aneurysm 5.1 cm by CT in 12/2018.  He is not a candidate for surgical repair.    Dispo:  Return in about 3 months (around 06/23/2019) for Routine Follow Up, w/ Dr. Burt Knack, or Harold Dopp, PA-C, in person.   Medication Adjustments/Labs and Tests Ordered: Current medicines are reviewed at length with the patient today.  Concerns regarding medicines are outlined above.  Tests Ordered: No orders of the defined types were placed in this encounter.  Medication Changes: No orders of the defined types were placed in this encounter.   Signed, Harold Dopp, PA-C  03/24/2019 4:58 PM    [facility] [address], [place], [place]  [address] Phone: [phone]; Fax: [phone]

## 2019-03-24 NOTE — Progress Notes (Signed)
thx Nicki Reaper

## 2019-03-24 NOTE — Patient Instructions (Signed)
Medication Instructions:   Your physician recommends that you continue on your current medications as directed. Please refer to the Current Medication list given to you today.  If you need a refill on your cardiac medications before your next appointment, please call your pharmacy.   Lab work:  Done today If you have labs (blood work) drawn today and your tests are completely normal, you will receive your results only by: Marland Kitchen MyChart Message (if you have MyChart) OR . A paper copy in the mail If you have any lab test that is abnormal or we need to change your treatment, we will call you to review the results.  Testing/Procedures:  None ordered today  Follow-Up:  On 06/24/19 at 3:45PM with Richardson Dopp, PA   Any Other Special Instructions Will Be Listed Below (If Applicable).  Keep follow up with Dr. Aundra Dubin on 04/03/19 at 10:40AM

## 2019-03-24 NOTE — Progress Notes (Deleted)
He was last seen March 10, 2019.

## 2019-03-25 ENCOUNTER — Encounter (HOSPITAL_BASED_OUTPATIENT_CLINIC_OR_DEPARTMENT_OTHER): Payer: Medicare Other | Attending: Physician Assistant

## 2019-03-25 DIAGNOSIS — I890 Lymphedema, not elsewhere classified: Secondary | ICD-10-CM | POA: Diagnosis not present

## 2019-03-25 DIAGNOSIS — Z7984 Long term (current) use of oral hypoglycemic drugs: Secondary | ICD-10-CM | POA: Insufficient documentation

## 2019-03-25 DIAGNOSIS — L97522 Non-pressure chronic ulcer of other part of left foot with fat layer exposed: Secondary | ICD-10-CM | POA: Diagnosis not present

## 2019-03-25 DIAGNOSIS — N183 Chronic kidney disease, stage 3 (moderate): Secondary | ICD-10-CM | POA: Insufficient documentation

## 2019-03-25 DIAGNOSIS — I872 Venous insufficiency (chronic) (peripheral): Secondary | ICD-10-CM | POA: Insufficient documentation

## 2019-03-25 DIAGNOSIS — I130 Hypertensive heart and chronic kidney disease with heart failure and stage 1 through stage 4 chronic kidney disease, or unspecified chronic kidney disease: Secondary | ICD-10-CM | POA: Insufficient documentation

## 2019-03-25 DIAGNOSIS — E11621 Type 2 diabetes mellitus with foot ulcer: Secondary | ICD-10-CM | POA: Diagnosis not present

## 2019-03-25 DIAGNOSIS — I5042 Chronic combined systolic (congestive) and diastolic (congestive) heart failure: Secondary | ICD-10-CM | POA: Insufficient documentation

## 2019-03-25 DIAGNOSIS — L97512 Non-pressure chronic ulcer of other part of right foot with fat layer exposed: Secondary | ICD-10-CM | POA: Insufficient documentation

## 2019-03-25 DIAGNOSIS — L97822 Non-pressure chronic ulcer of other part of left lower leg with fat layer exposed: Secondary | ICD-10-CM | POA: Insufficient documentation

## 2019-03-25 DIAGNOSIS — L97812 Non-pressure chronic ulcer of other part of right lower leg with fat layer exposed: Secondary | ICD-10-CM | POA: Insufficient documentation

## 2019-03-25 LAB — BASIC METABOLIC PANEL
BUN/Creatinine Ratio: 24 (ref 10–24)
BUN: 50 mg/dL — ABNORMAL HIGH (ref 8–27)
CO2: 27 mmol/L (ref 20–29)
Calcium: 8.4 mg/dL — ABNORMAL LOW (ref 8.6–10.2)
Chloride: 96 mmol/L (ref 96–106)
Creatinine, Ser: 2.1 mg/dL — ABNORMAL HIGH (ref 0.76–1.27)
GFR calc Af Amer: 36 mL/min/{1.73_m2} — ABNORMAL LOW (ref >59)
GFR calc non Af Amer: 31 mL/min/{1.73_m2} — ABNORMAL LOW (ref >59)
Glucose: 408 mg/dL — ABNORMAL HIGH (ref 65–99)
Potassium: 4 mmol/L (ref 3.5–5.2)
Sodium: 137 mmol/L (ref 134–144)

## 2019-03-25 NOTE — Telephone Encounter (Signed)
Will close encounter

## 2019-03-25 NOTE — Telephone Encounter (Signed)
Pt pft  Sched.[name]

## 2019-03-26 ENCOUNTER — Telehealth: Payer: Self-pay | Admitting: *Deleted

## 2019-03-26 DIAGNOSIS — Z79899 Other long term (current) drug therapy: Secondary | ICD-10-CM

## 2019-03-26 MED ORDER — TORSEMIDE 20 MG PO TABS: 20.0000 mg | ORAL_TABLET | Freq: Two times a day (BID) | ORAL | 3 refills | Status: DC

## 2019-03-26 NOTE — Telephone Encounter (Signed)
-----   Message from Liliane Shi, [place] sent at 03/25/2019  4:47 PM EDT ----- Creatinine increasing.  Potassium normal.  Glucose significantly elevated. PLAN:   -Decrease torsemide to 40 mg daily  -Repeat BMET appointment with Dr. Aundra Dubin in the heart failure clinic on 04/03/2019  -Follow-up with PCP for diabetes.  Send copy of labs to PCP Richardson Dopp, PA-C    03/25/2019 4:46 PM

## 2019-03-31 ENCOUNTER — Encounter: Payer: Self-pay | Admitting: *Deleted

## 2019-04-03 ENCOUNTER — Other Ambulatory Visit: Payer: Medicare Other | Admitting: *Deleted

## 2019-04-03 ENCOUNTER — Encounter (HOSPITAL_BASED_OUTPATIENT_CLINIC_OR_DEPARTMENT_OTHER): Payer: Medicare Other | Attending: Internal Medicine | Admitting: Internal Medicine

## 2019-04-03 ENCOUNTER — Encounter (HOSPITAL_COMMUNITY): Payer: Self-pay | Admitting: Cardiology

## 2019-04-03 ENCOUNTER — Ambulatory Visit (HOSPITAL_COMMUNITY)
Admission: RE | Admit: 2019-04-03 | Discharge: 2019-04-03 | Disposition: A | Discharge status: Home / Self Care | Payer: Medicare Other | Source: Ambulatory Visit | Attending: Cardiology | Admitting: Cardiology

## 2019-04-03 ENCOUNTER — Other Ambulatory Visit: Payer: Self-pay

## 2019-04-03 VITALS — BP 118/65 | HR 65 | Wt 220.2 lb

## 2019-04-03 DIAGNOSIS — Z836 Family history of other diseases of the respiratory system: Secondary | ICD-10-CM | POA: Insufficient documentation

## 2019-04-03 DIAGNOSIS — Z8673 Personal history of transient ischemic attack (TIA), and cerebral infarction without residual deficits: Secondary | ICD-10-CM | POA: Insufficient documentation

## 2019-04-03 DIAGNOSIS — I130 Hypertensive heart and chronic kidney disease with heart failure and stage 1 through stage 4 chronic kidney disease, or unspecified chronic kidney disease: Secondary | ICD-10-CM | POA: Diagnosis not present

## 2019-04-03 DIAGNOSIS — Z8674 Personal history of sudden cardiac arrest: Secondary | ICD-10-CM | POA: Diagnosis not present

## 2019-04-03 DIAGNOSIS — I5042 Chronic combined systolic (congestive) and diastolic (congestive) heart failure: Secondary | ICD-10-CM

## 2019-04-03 DIAGNOSIS — I872 Venous insufficiency (chronic) (peripheral): Secondary | ICD-10-CM | POA: Diagnosis not present

## 2019-04-03 DIAGNOSIS — L97812 Non-pressure chronic ulcer of other part of right lower leg with fat layer exposed: Secondary | ICD-10-CM | POA: Diagnosis not present

## 2019-04-03 DIAGNOSIS — R0683 Snoring: Secondary | ICD-10-CM

## 2019-04-03 DIAGNOSIS — I447 Left bundle-branch block, unspecified: Secondary | ICD-10-CM | POA: Insufficient documentation

## 2019-04-03 DIAGNOSIS — L97519 Non-pressure chronic ulcer of other part of right foot with unspecified severity: Secondary | ICD-10-CM

## 2019-04-03 DIAGNOSIS — L97512 Non-pressure chronic ulcer of other part of right foot with fat layer exposed: Secondary | ICD-10-CM | POA: Diagnosis not present

## 2019-04-03 DIAGNOSIS — I2510 Atherosclerotic heart disease of native coronary artery without angina pectoris: Secondary | ICD-10-CM | POA: Insufficient documentation

## 2019-04-03 DIAGNOSIS — Z7984 Long term (current) use of oral hypoglycemic drugs: Secondary | ICD-10-CM | POA: Insufficient documentation

## 2019-04-03 DIAGNOSIS — N183 Chronic kidney disease, stage 3 unspecified: Secondary | ICD-10-CM | POA: Insufficient documentation

## 2019-04-03 DIAGNOSIS — I5022 Chronic systolic (congestive) heart failure: Secondary | ICD-10-CM

## 2019-04-03 DIAGNOSIS — J189 Pneumonia, unspecified organism: Secondary | ICD-10-CM | POA: Insufficient documentation

## 2019-04-03 DIAGNOSIS — I5032 Chronic diastolic (congestive) heart failure: Secondary | ICD-10-CM | POA: Diagnosis not present

## 2019-04-03 DIAGNOSIS — Z7982 Long term (current) use of aspirin: Secondary | ICD-10-CM | POA: Insufficient documentation

## 2019-04-03 DIAGNOSIS — Z79899 Other long term (current) drug therapy: Secondary | ICD-10-CM | POA: Insufficient documentation

## 2019-04-03 DIAGNOSIS — E1122 Type 2 diabetes mellitus with diabetic chronic kidney disease: Secondary | ICD-10-CM | POA: Diagnosis not present

## 2019-04-03 DIAGNOSIS — L97822 Non-pressure chronic ulcer of other part of left lower leg with fat layer exposed: Secondary | ICD-10-CM | POA: Insufficient documentation

## 2019-04-03 DIAGNOSIS — E785 Hyperlipidemia, unspecified: Secondary | ICD-10-CM | POA: Diagnosis not present

## 2019-04-03 DIAGNOSIS — G4733 Obstructive sleep apnea (adult) (pediatric): Secondary | ICD-10-CM | POA: Insufficient documentation

## 2019-04-03 DIAGNOSIS — Z8249 Family history of ischemic heart disease and other diseases of the circulatory system: Secondary | ICD-10-CM | POA: Insufficient documentation

## 2019-04-03 DIAGNOSIS — I712 Thoracic aortic aneurysm, without rupture: Secondary | ICD-10-CM | POA: Insufficient documentation

## 2019-04-03 DIAGNOSIS — E11621 Type 2 diabetes mellitus with foot ulcer: Secondary | ICD-10-CM | POA: Insufficient documentation

## 2019-04-03 DIAGNOSIS — L97529 Non-pressure chronic ulcer of other part of left foot with unspecified severity: Secondary | ICD-10-CM

## 2019-04-03 LAB — BASIC METABOLIC PANEL
Anion gap: 12 (ref 5–15)
BUN/Creatinine Ratio: 16 (ref 10–24)
BUN: 28 mg/dL — ABNORMAL HIGH (ref 8–27)
BUN: 28 mg/dL — ABNORMAL HIGH (ref 8–23)
CO2: 30 mmol/L — ABNORMAL HIGH (ref 20–29)
CO2: 25 mmol/L (ref 22–32)
Calcium: 9 mg/dL (ref 8.6–10.2)
Calcium: 8.7 mg/dL — ABNORMAL LOW (ref 8.9–10.3)
Chloride: 96 mmol/L (ref 96–106)
Chloride: 100 mmol/L (ref 98–111)
Creatinine, Ser: 1.75 mg/dL — ABNORMAL HIGH (ref 0.76–1.27)
Creatinine, Ser: 1.8 mg/dL — ABNORMAL HIGH (ref 0.61–1.24)
GFR calc Af Amer: 45 mL/min/{1.73_m2} — ABNORMAL LOW (ref >59)
GFR calc Af Amer: 44 mL/min — ABNORMAL LOW (ref >60)
GFR calc non Af Amer: 39 mL/min/{1.73_m2} — ABNORMAL LOW (ref >59)
GFR calc non Af Amer: 38 mL/min — ABNORMAL LOW (ref >60)
Glucose, Bld: 209 mg/dL — ABNORMAL HIGH (ref 70–99)
Glucose: 218 mg/dL — ABNORMAL HIGH (ref 65–99)
Potassium: 4.7 mmol/L (ref 3.5–5.2)
Potassium: 4.6 mmol/L (ref 3.5–5.1)
Sodium: 138 mmol/L (ref 134–144)
Sodium: 137 mmol/L (ref 135–145)

## 2019-04-03 LAB — BRAIN NATRIURETIC PEPTIDE: B Natriuretic Peptide: 138.2 pg/mL — ABNORMAL HIGH (ref 0.0–100.0)

## 2019-04-03 MED ORDER — TORSEMIDE 20 MG PO TABS: ORAL_TABLET | ORAL | 3 refills | Status: DC

## 2019-04-03 MED ORDER — POTASSIUM CHLORIDE CRYS ER 20 MEQ PO TBCR: 20.0000 meq | EXTENDED_RELEASE_TABLET | Freq: Every day | ORAL | 3 refills | Status: DC

## 2019-04-03 NOTE — Patient Instructions (Addendum)
TAKE Toresmide 40mg  (2 tabs) twice a day for 3 days   THEN TAKE Torsemide 40mg  (2 tabs) in the morning, THEN Torsemide 20mg  (1 tab) in the evening.   START Potassium 22meq (1 tab) daily   You have been ordered for a home sleep study. It will be sent to your home with instructions.  You have been given a patient help info sheet.    Labs today and repeat in 10 days We will only contact you if something comes back abnormal or we need to make some changes. Otherwise no news is good news!   You have been referred for a Cardio Mems device.  Please see folder given to you in office.  You will be contacted by our office rep for guidance on this.   You have been ordered an ultrasound of your legs. You will get a call to schedule this appointment.    Your physician recommends that you schedule a follow-up appointment in: 3 weeks with Dr Aundra Dubin.    At the [facility], you and your health needs are our priority. As part of our continuing mission to provide you with exceptional heart care, we have created designated Provider Care Teams. These Care Teams include your primary Cardiologist (physician) and Advanced Practice Providers (APPs- Physician Assistants and Nurse Practitioners) who all work together to provide you with the care you need, when you need it.   You may see any of the following providers on your designated Care Team at your next follow up: Marland Kitchen Dr Glori Bickers . Dr Loralie Champagne . Darrick Grinder, NP   Please be sure to bring in all your medications bottles to every appointment.

## 2019-04-03 NOTE — Progress Notes (Signed)
Patient Name: [name]        DOB: January 25, 1951      Height: 5'7    Weight:220 lb  Office Name:[facility]         Referring Provider: Dr Aundra Dubin  Today's Date: 04/03/19  Date:   STOP BANG RISK ASSESSMENT S (snore) Have you been told that you snore?     YES   T (tired) Are you often tired, fatigued, or sleepy during the day?   YES  O (obstruction) Do you stop breathing, choke, or gasp during sleep? YES   P (pressure) Do you have or are you being treated for high blood pressure? NO   B (BMI) Is your body index greater than 35 kg/m? NO   A (age) Are you 32 years old or older? YES   N (neck) Do you have a neck circumference greater than 16 inches?   NA   G (gender) Are you a male? YES   TOTAL STOP/BANG "YES" ANSWERS 5                                                                       For Office Use Only              Procedure Order Form    YES to 3+ Stop Bang questions OR two clinical symptoms - patient qualifies for WatchPAT (CPT 95800)     Submit: This Form + Patient Face Sheet + Clinical Note via Cloud[employer] or Fax: [phone]         Clinical Notes: Will consult Sleep Specialist and refer for management of therapy due to patient increased risk of Sleep Apnea. Ordering a sleep study due to the following two clinical symptoms: Excessive daytime sleepiness G47.10 / Gastroesophageal reflux K21.9 / Nocturia R35.1 / Morning Headaches G44.221 / Difficulty concentrating R41.840 / Memory problems or poor judgment G31.84 / Personality changes or irritability R45.4 / Loud snoring R06.83 / Depression F32.9 / Unrefreshed by sleep G47.8 / Impotence N52.9 / History of high blood pressure R03.0 / Insomnia G47.00    I understand that I am proceeding with a home sleep apnea test as ordered by my treating physician. I understand that untreated sleep apnea is a serious cardiovascular risk factor and it is my responsibility to perform the test and seek management for sleep apnea. I  will be contacted with the results and be managed for sleep apnea by a local sleep physician. I will be receiving equipment and further instructions from [facility]. I shall promptly ship back the equipment via the included mailing label. I understand my insurance will be billed for the test and as the patient I am responsible for any insurance related out-of-pocket costs incurred. I have been provided with written instructions and can call for additional video or telephonic instruction, with 24-hour availability of qualified personnel to answer any questions: Patient Help Desk [phone].  Patient Signature ______________________________________________________   Date______________________ Patient Telemedicine Verbal Consent

## 2019-04-03 NOTE — Progress Notes (Signed)
[name] ([id number]) Visit Report for 04/03/2019 HPI Details Patient Name: Date of Service: [name], [name] 04/03/2019 4:00 PM Waller Record [name]:[id number] Patient Account Number: [id number] Date of Birth/Sex: Treating RN: 27-Oct-1950 (68 y.o. [name] Primary Care Provider: [facility] Other Clinician: Referring Provider: Treating Provider/Extender:[name], [name], [name] in Treatment: 1 History of Present Illness HPI Description: 03/25/2019 patient presents today for initial evaluation in our clinic concerning wounds on the bilateral lower extremities. He has a significant Waller history which is positive for chronic venous insufficiency, lymphedema, diabetes mellitus type 2, congestive heart failure, hypertension, atherosclerotic heart disease, and stage III chronic kidney disease. His issue at this point is that he has had for the past 2 months problems with edema and swelling of the bilateral lower extremities and apparently this is after he had issues with his heart. His ejection fraction is 45 to 50% based on his last test. He was in the hospital with regard to this and during the time in the hospital was given Silvadene cream by the wound care ostomy nurse to be applied to the legs. No wrap was recommended according to what the patient and his wife tell me at this time. With that being said I do think that the patient likely needs bilateral compression wraps to try to help get some of the fluid out of legs and help the wounds to heal. I do not see any signs of active infection which is good news. No fevers, chills, nausea, vomiting, or diarrhea.Patient is not having severe pain although he does have some discomfort on the bilateral lower extremities with cleaning over the area. 04/03/2019; patient returns to clinic today. He looks a lot better per our nurses. Most of the areas on the left leg are healed his edema control is excellent. Still an open area  on the lower left medial leg. On the right the wounds and the swelling are a little worse than the left however everything looks improved here as well. He was able to tolerate 3 layer compression. He apparently has juxta lite stockings I asked him to bring these in next week. The area on the left leg may be healed. He tells me that his cardiologist increased his torsemide to 20 twice daily. He has a history of congestive heart failure Electronic Signature(s) Signed: 04/03/2019 6:19:43 PM By: Harold Najjar MD Entered By: [name] on 04/03/2019 18:11:00 -------------------------------------------------------------------------------- Physical Exam Details Patient Name: Date of Service: [name] 04/03/2019 4:00 PM Waller Record [name]:[id number] Patient Account Number: [id number] Date of Birth/Sex: Treating RN: 07/18/1950 (68 y.o. [name] Primary Care Provider: [facility] Other Clinician: Referring Provider: Treating Provider/Extender:[name], [name], [name] in Treatment: 1 Constitutional Sitting or standing Blood Pressure is within target range for patient.. Pulse regular and within target range for patient.Marland Kitchen Respirations regular, non-labored and within target range.. Temperature is normal and within the target range for the patient.Marland Kitchen Appears in no distress. Eyes Conjunctivae clear. No discharge.no icterus. Respiratory work of breathing is normal. Cardiovascular Heart rhythm and rate regular, without murmur or gallop. JVP is not elevated no coccyx edema. Edema present in both extremities. This is nonpitting. Right greater than left. Integumentary (Hair, Skin) Dry flaky lower extremity skin thickened. Psychiatric appears at normal baseline. Notes Wound exam; his edema is under much better control left greater than the right. There is no sign of active infection. There is still an open area/de-epithelialized area on the left lower medial calf  more extensive areas on the  right but a lot of this is already healed Electronic Signature(s) Signed: 04/03/2019 6:19:43 PM By: Harold Najjar MD Entered By: [name] on 04/03/2019 18:12:29 -------------------------------------------------------------------------------- Physician Orders Details Patient Name: Date of Service: [name] 04/03/2019 4:00 PM Waller Record [id number] Patient Account Number: [id number] Date of Birth/Sex: Treating RN: 1951/06/19 (68 y.o. [name] Primary Care Provider: [facility] Other Clinician: Referring Provider: Treating Provider/Extender:[name], [name], [name] in Treatment: 1 Verbal / Phone Orders: No Diagnosis Coding ICD-10 Coding Code Description I87.2 Venous insufficiency (chronic) (peripheral) I89.0 Lymphedema, not elsewhere classified E11.621 Type 2 diabetes mellitus with foot ulcer L97.812 Non-pressure chronic ulcer of other part of right lower leg with fat layer exposed L97.822 Non-pressure chronic ulcer of other part of left lower leg with fat layer exposed L97.522 Non-pressure chronic ulcer of other part of left foot with fat layer exposed L97.512 Non-pressure chronic ulcer of other part of right foot with fat layer exposed I50.42 Chronic combined systolic (congestive) and diastolic (congestive) heart failure I10 Essential (primary) hypertension I25.10 Atherosclerotic heart disease of native coronary artery without angina pectoris N18.3 Chronic kidney disease, stage 3 (moderate) Follow-up Appointments Return Appointment in 1 week. Dressing Change Frequency Do not change entire dressing for one week. - both legs Skin Barriers/Peri-Wound Care TCA Cream or Ointment - mixed with lotion to legs Wound Cleansing May shower with protection. Primary Wound Dressing Wound #1 Right,Circumferential Lower Leg Calcium Alginate with Silver Wound #2 Left,Circumferential Lower Leg Calcium Alginate with  Silver Wound #3 Right,Dorsal Foot Calcium Alginate with Silver Wound #4 Left,Dorsal Foot Calcium Alginate with Silver Wound #5 Left Toe Great Calcium Alginate with Silver Secondary Dressing Wound #1 Right,Circumferential Lower Leg ABD pad Kerramax Wound #2 Left,Circumferential Lower Leg ABD pad Kerramax Wound #3 Right,Dorsal Foot Dry Gauze Wound #4 Left,Dorsal Foot Dry Gauze Wound #5 Left Toe Great Kerlix/Rolled Gauze Dry Gauze Edema Control 3 Layer Compression System - Bilateral Avoid standing for long periods of time Elevate legs to the level of the heart or above for 30 minutes daily and/or when sitting, a frequency of: - at least 4 times per day Exercise regularly - walking is encouraged, foot flexion exercises throughout the day Electronic Signature(s) Signed: 04/03/2019 6:19:43 PM By: Harold Najjar MD Signed: 04/03/2019 7:05:19 PM By: Zenaida Deed RN, BSN Entered By: Zenaida Deed on 04/03/2019 17:39:36 -------------------------------------------------------------------------------- Problem List Details Patient Name: Date of Service: [name], [name] 04/03/2019 4:00 PM Waller Record [id number] Patient Account Number: [id number] Date of Birth/Sex: Treating RN: [birth year] (68 y.o. [name] Primary Care Provider: [facility] Other Clinician: Referring Provider: Treating Provider/Extender:[name], [name], [name] in Treatment: 1 Active Problems ICD-10 Evaluated Encounter Code Description Active Date Today Diagnosis I87.2 Venous insufficiency (chronic) (peripheral) 03/25/2019 No Yes I89.0 Lymphedema, not elsewhere classified 03/25/2019 No Yes E11.621 Type 2 diabetes mellitus with foot ulcer 03/25/2019 No Yes L97.812 Non-pressure chronic ulcer of other part of right lower 03/25/2019 No Yes leg with fat layer exposed L97.822 Non-pressure chronic ulcer of other part of left lower 03/25/2019 No Yes leg with fat layer exposed L97.522  Non-pressure chronic ulcer of other part of left foot 03/25/2019 No Yes with fat layer exposed L97.512 Non-pressure chronic ulcer of other part of right foot 03/25/2019 No Yes with fat layer exposed I50.42 Chronic combined systolic (congestive) and diastolic 03/25/2019 No Yes (congestive) heart failure I10 Essential (primary) hypertension 03/25/2019 No Yes I25.10 Atherosclerotic heart disease of native coronary artery 03/25/2019 No Yes without angina pectoris N18.3 Chronic kidney disease, stage 3 (  moderate) 03/25/2019 No Yes Inactive Problems Resolved Problems Electronic Signature(s) Signed: 04/03/2019 6:19:43 PM By: Harold Najjarobson, Michael MD Entered By: Harold Najjarobson, Michael on 04/03/2019 18:09:30 -------------------------------------------------------------------------------- Progress Note Details Patient Name: Date of Service: Harold ManisENLEY, Josede 04/03/2019 4:00 PM Waller Record [id number] Patient Account Number: [id number] Date of Birth/Sex: Treating RN: 02/27/1951 (68 y.o. Harold SchoonerM) Boehlein, Linda Primary Care Provider: [facility] Other Clinician: Referring Provider: Treating Provider/Extender:[name], Harold LarsenMichael Weaver, Harold HandingScott Weeks in Treatment: 1 Subjective History of Present Illness (HPI) 03/25/2019 patient presents today for initial evaluation in our clinic concerning wounds on the bilateral lower extremities. He has a significant Waller history which is positive for chronic venous insufficiency, lymphedema, diabetes mellitus type 2, congestive heart failure, hypertension, atherosclerotic heart disease, and stage III chronic kidney disease. His issue at this point is that he has had for the past 2 months problems with edema and swelling of the bilateral lower extremities and apparently this is after he had issues with his heart. His ejection fraction is 45 to 50% based on his last test. He was in the hospital with regard to this and during the time in the hospital was given Silvadene cream by the  wound care ostomy nurse to be applied to the legs. No wrap was recommended according to what the patient and his wife tell me at this time. With that being said I do think that the patient likely needs bilateral compression wraps to try to help get some of the fluid out of legs and help the wounds to heal. I do not see any signs of active infection which is good news. No fevers, chills, nausea, vomiting, or diarrhea.Patient is not having severe pain although he does have some discomfort on the bilateral lower extremities with cleaning over the area. 04/03/2019; patient returns to clinic today. He looks a lot better per our nurses. Most of the areas on the left leg are healed his edema control is excellent. Still an open area on the lower left medial leg. On the right the wounds and the swelling are a little worse than the left however everything looks improved here as well. He was able to tolerate 3 layer compression. He apparently has juxta lite stockings I asked him to bring these in next week. The area on the left leg may be healed. He tells me that his cardiologist increased his torsemide to 20 twice daily. He has a history of congestive heart failure Objective Constitutional Sitting or standing Blood Pressure is within target range for patient.. Pulse regular and within target range for patient.Marland Kitchen. Respirations regular, non-labored and within target range.. Temperature is normal and within the target range for the patient.Marland Kitchen. Appears in no distress. Vitals Time Taken: 4:35 PM, Height: 67 in, Weight: 222 lbs, BMI: 34.8, Temperature: 97.5 F, Pulse: 81 bpm, Respiratory Rate: 19 breaths/min, Blood Pressure: 104/62 mmHg. Eyes Conjunctivae clear. No discharge.no icterus. Respiratory work of breathing is normal. Cardiovascular Heart rhythm and rate regular, without murmur or gallop. JVP is not elevated no coccyx edema. Edema present in both extremities. This is nonpitting. Right greater than  left. Psychiatric appears at normal baseline. General Notes: Wound exam; his edema is under much better control left greater than the right. There is no sign of active infection. There is still an open area/de-epithelialized area on the left lower medial calf more extensive areas on the right but a lot of this is already healed Integumentary (Hair, Skin) Dry flaky lower extremity skin thickened. Wound #1 status is Open. Original cause of wound  was Gradually Appeared. The wound is located on the Right,Circumferential Lower Leg. The wound measures 15cm length x 27cm width x 0.1cm depth; 318.086cm^2 area and 31.809cm^3 volume. There is Fat Layer (Subcutaneous Tissue) Exposed exposed. There is no tunneling or undermining noted. There is a medium amount of serous drainage noted. The wound margin is flat and intact. There is large (67-100%) pink granulation within the wound bed. There is a small (1-33%) amount of necrotic tissue within the wound bed including Adherent Slough. Wound #2 status is Open. Original cause of wound was Gradually Appeared. The wound is located on the Left,Circumferential Lower Leg. The wound measures 6.5cm length x 16cm width x 0.1cm depth; 81.681cm^2 area and 8.168cm^3 volume. There is Fat Layer (Subcutaneous Tissue) Exposed exposed. There is no tunneling or undermining noted. There is a medium amount of serous drainage noted. The wound margin is flat and intact. There is large (67-100%) pink granulation within the wound bed. There is a small (1-33%) amount of necrotic tissue within the wound bed including Adherent Slough. Wound #3 status is Open. Original cause of wound was Gradually Appeared. The wound is located on the Right,Dorsal Foot. The wound measures 0.5cm length x 3.5cm width x 0.1cm depth; 1.374cm^2 area and 0.137cm^3 volume. There is Fat Layer (Subcutaneous Tissue) Exposed exposed. There is no tunneling or undermining noted. There is a large amount of serous  drainage noted. The wound margin is flat and intact. There is large (67-100%) pink granulation within the wound bed. There is no necrotic tissue within the wound bed. Wound #4 status is Open. Original cause of wound was Gradually Appeared. The wound is located on the Left,Dorsal Foot. The wound measures 0cm length x 0cm width x 0cm depth; 0cm^2 area and 0cm^3 volume. There is no tunneling or undermining noted. There is a none present amount of drainage noted. The wound margin is flat and intact. There is no granulation within the wound bed. There is no necrotic tissue within the wound bed. Wound #5 status is Open. Original cause of wound was Gradually Appeared. The wound is located on the Left Toe Great. The wound measures 0cm length x 0cm width x 0cm depth; 0cm^2 area and 0cm^3 volume. There is no tunneling or undermining noted. There is a none present amount of drainage noted. The wound margin is flat and intact. There is no granulation within the wound bed. There is no necrotic tissue within the wound bed. 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 There was a Three Layer Compression Therapy Procedure by  Deon Pilling, RN. Post procedure Diagnosis Wound #1: Same as Pre-Procedure Wound #2 Pre-procedure diagnosis of Wound #2 is a Venous Leg Ulcer located on the Left,Circumferential Lower Leg . There was a Three Layer Compression Therapy Procedure by Deon Pilling, RN. Post procedure Diagnosis Wound #2: Same as Pre-Procedure  Plan Follow-up Appointments: Return Appointment in 1 week. Dressing Change Frequency: Do not change entire dressing for one week. - both legs Skin Barriers/Peri-Wound Care: TCA Cream or Ointment - mixed with lotion to legs Wound Cleansing: May shower with protection. Primary Wound Dressing: Wound #1 Right,Circumferential Lower Leg: Calcium Alginate with Silver Wound #2 Left,Circumferential Lower Leg: Calcium Alginate with Silver Wound #3 Right,Dorsal Foot: Calcium Alginate with Silver Wound #4 Left,Dorsal Foot: Calcium Alginate with Silver Wound #5 Left Toe Great: Calcium Alginate with Silver Secondary Dressing: Wound #1 Right,Circumferential Lower Leg: ABD pad Kerramax Wound #2 Left,Circumferential Lower Leg: ABD pad Kerramax Wound #3 Right,Dorsal Foot: Dry Gauze Wound #4 Left,Dorsal Foot: Dry Gauze Wound #5 Left Toe Great: Kerlix/Rolled Gauze Dry Gauze Edema Control: 3 Layer Compression System - Bilateral Avoid standing for long periods of time Elevate legs to the level of the heart or above for 30 minutes daily and/or when sitting, a frequency of: - at least 4 times per day Exercise regularly - walking is encouraged, foot flexion exercises throughout the day 1. TCA bilaterally 2. Silver alginate to all wound areas 3. Kerramax/ABDs/under 3 layer compression 4. Everything looks a lot better today per discussion with our nurses. No evidence of cellulitis. 5. There is no current evidence at the bedside if obvious congestive heart failure. His diuretics were recently increased Electronic Signature(s) Signed: 04/03/2019 6:19:43 PM By: Harold Najjar  MD Entered By: [name] on 04/03/2019 18:13:35 -------------------------------------------------------------------------------- SuperBill Details Patient Name: Date of Service: ALGIE, WESTRY 04/03/2019 Waller Record [id number] Patient Account Number: [id number] Date of Birth/Sex: Treating RN: 07/12/1950 (68 y.o. [name] Primary Care Provider: [facility] Other Clinician: Referring Provider: Treating Provider/Extender:[name], [name], [name] in Treatment: 1 Diagnosis Coding ICD-10 Codes Code Description I87.2 Venous insufficiency (chronic) (peripheral) I89.0 Lymphedema, not elsewhere classified E11.621 Type 2 diabetes mellitus with foot ulcer L97.812 Non-pressure chronic ulcer of other part of right lower leg with fat layer exposed L97.822 Non-pressure chronic ulcer of other part of left lower leg with fat layer exposed L97.522 Non-pressure chronic ulcer of other part of left foot with fat layer exposed L97.512 Non-pressure chronic ulcer of other part of right foot with fat layer exposed I50.42 Chronic combined systolic (congestive) and diastolic (congestive) heart failure I10 Essential (primary) hypertension I25.10 Atherosclerotic heart disease of native coronary artery without angina pectoris N18.3 Chronic kidney disease, stage 3 (moderate) Facility Procedures CPT4: Description Modifier Quantity Code [id number] BILATERAL: Application of multi-layer venous compression system; leg 1 (below knee), including ankle and foot. Physician Procedures CPT4 Code Description: [id number] - WC PHYS LEVEL 4 - EST PT ICD-10 Diagnosis Description I89.0 Lymphedema, not elsewhere classified L97.812 Non-pressure chronic ulcer of other part of right lower L97.822 Non-pressure chronic ulcer of other part of  left lower l L97.512 Non-pressure chronic ulcer of other part of right foot w Modifier: leg with fat la eg with fat lay ith fat layer e Quantity: 1 yer  exposed er exposed xposed Electronic Signature(s) Signed: 04/03/2019 6:19:43 PM By: Harold Najjar MD Entered By: [name] on 04/03/2019 18:14:05

## 2019-04-04 NOTE — Progress Notes (Signed)
[facility]: Dr. Aundra Dubin  68 y.o. with history of CHF, [name], and recurrent PNAs was referred for CHF treatment by Richardson Dopp.  He has a history of mild LV systolic dysfunction (EF 29-52% range) with probably significant RV failure.  Patient was admitted in 4/20 with hypoxic respiratory failure due to PNA and decompensated HF.  Hospitalization was complicated by PEA arrest and AKI.   In 6/20, he was again admitted with hypoxemic respiratory failure with hypertensive crisis, CHF, and PNA.    He continues to be symptomatic at home.  His legs are weeping from venous stasis ulcerations and he is going to the wound clinic. Weight has been stable at home per his report.  He is able to walk to the mailbox and due his ADLs around the house without significant dyspnea.  He gets fatigued and short of breath when he tries to go shopping.  He has bendopnea and chronic orthopnea, sleeps in lounge chair. No chest pain.  He uses home oxygen at night.   REDS vest: 51%  Labs (4/20): LDL 91 Labs (9/20): K 4, creatinine 1.95 => 2.1  PMH: 1. HTN 2. [name] stage 3 3. Type 2 diabetes 4. Hyperlipidemia 5. COPD: Suspected 6. PNA: Recurrent, suspected aspiration.  7. PEA arrest 4/20: Respiratory arrest.  8. LBBB 9. TIA 10. Ascending aortic aneurysm: CT chest in 6/20 with 5.1 cm ascending aortic aneurysm. 11. Chronic primarily diastolic CHF:  - LHC (8/41): Nonobstructive CAD.  - Echo (12/19): EF 45-50%.  - Echo (4/20): EF 45-50%, diffuse hypokinesis, normal RV.  12. Ventricular ectopy: 8/20 monitor showed PVCs and NSVT.  13. Vascular study (11/19): TBIs normal.   Social History   Socioeconomic History  . Marital status: Married    Spouse name: Not on file  . Number of children: Not on file  . Years of education: Not on file  . Highest education level: Not on file  Occupational History  . Not on file  Social Needs  . Financial resource strain: Not very hard  . Food insecurity    Worry: Never true   Inability: Never true  . Transportation needs    Medical: Patient refused    Non-medical: Patient refused  Tobacco Use  . Smoking status: Never Smoker  . Smokeless tobacco: Never Used  Substance and Sexual Activity  . Alcohol use: No  . Drug use: No  . Sexual activity: Not Currently    Partners: Female    Birth control/protection: None  Lifestyle  . Physical activity    Days per week: 0 days    Minutes per session: 0 min  . Stress: Not at all  Relationships  . Social Herbalist on phone: Patient refused    Gets together: Patient refused    Attends religious service: Patient refused    Active member of club or organization: Patient refused    Attends meetings of clubs or organizations: Patient refused    Relationship status: Patient refused  . Intimate partner violence    Fear of current or ex partner: No    Emotionally abused: No    Physically abused: No    Forced sexual activity: No  Other Topics Concern  . Not on file  Social History Narrative   Patient lives in private residence with supportive spouse   Family History  Problem Relation Age of Onset  . Emphysema Mother   . Aneurysm Mother   . Emphysema Father   . Coronary artery disease Brother   .  Coronary artery disease Brother   . Coronary artery disease Sister    ROS: All systems reviewed and negative except as per HPI.   Current Outpatient Medications  Medication Sig Dispense Refill  . Accu-Chek FastClix Lancets MISC USE UTD    . ACCU-CHEK GUIDE test strip U TO TEST BLOOD SUGAR QD    . albuterol (PROVENTIL HFA;VENTOLIN HFA) 108 (90 Base) MCG/ACT inhaler Inhale 2 puffs into the lungs every 6 (six) hours as needed for wheezing or shortness of breath. 1 Inhaler 2  . aspirin EC 81 MG tablet Take 81 mg by mouth daily.    Marland Kitchen atorvastatin (LIPITOR) 40 MG tablet Take 1 tablet (40 mg total) by mouth every evening. 30 tablet 0  . Blood Glucose Monitoring Suppl (ACCU-CHEK GUIDE) w/Device KIT U TO TEST BLOOD  SUGAR QD    . carvedilol (COREG) 6.25 MG tablet Take 1.5 tablets (9.375 mg total) by mouth 2 (two) times daily. 180 tablet 3  . finasteride (PROSCAR) 5 MG tablet Take 5 mg by mouth daily.    Marland Kitchen gabapentin (NEURONTIN) 300 MG capsule Take 300 mg by mouth 4 (four) times daily.     . hydrOXYzine (VISTARIL) 50 MG capsule Take 50 mg by mouth 3 (three) times daily.    . metFORMIN (GLUCOPHAGE) 1000 MG tablet Take 1 tablet by mouth 2 (two) times a day.    . montelukast (SINGULAIR) 10 MG tablet Take 10 mg by mouth at bedtime.    . Multiple Vitamin (MULTIVITAMIN) tablet Take 1 tablet by mouth daily.    . pantoprazole (PROTONIX) 40 MG tablet Take 1 tablet (40 mg total) by mouth daily. 30 tablet 1  . tamsulosin (FLOMAX) 0.4 MG CAPS capsule Take 0.4 mg by mouth daily.    Marland Kitchen torsemide (DEMADEX) 20 MG tablet Take 2 tablets (40 mg total) by mouth every morning AND 1 tablet (20 mg total) every evening. 275 tablet 3  . potassium chloride SA (KLOR-CON) 20 MEQ tablet Take 1 tablet (20 mEq total) by mouth daily. 90 tablet 3   No current facility-administered medications for this encounter.    BP 118/65   Pulse 65   Wt 99.9 kg (220 lb 3.2 oz)   SpO2 95%   BMI 34.49 kg/m  General: NAD Neck: JVP 9-10 cm, no thyromegaly or thyroid nodule.  Lungs: Clear to auscultation bilaterally with normal respiratory effort. CV: Nondisplaced PMI.  Heart regular S1/S2, no S3/S4, 2/6 early SEM RUSB with clear S2.  2+ edema to knees bilaterally.  No carotid bruit.  Normal pedal pulses.  Abdomen: Soft, nontender, no hepatosplenomegaly, mild-moderate distention.  Skin: Intact without lesions or rashes.  Neurologic: Alert and oriented x 3.  Psych: Normal affect. Extremities: No clubbing or cyanosis. Venous stasis ulcerations.  HEENT: Normal.   Assessment/Plan: 1. Chronic primarily diastolic CHF: I suspect that there is significant RV failure.  Last echo in 4/20 with EF 45-50%.  Cath in 9/12 showed nonobstructive coronary disease.   He remains volume overloaded on exam with JVD.  REDS clip reading elevated at 51%.  - I walked him in clinic today, oxygen saturation drops < 88% with ambulation.  I will arrange for him to get oxygen to use with exertion (he already uses oxygen at night.  - Increase torsemide to 40 mg bid x 3 days then 40 qam/20 qpm.  Will have to follow renal function carefully, BMET today and again in 10 days.  - I think he would be a good Cardiomems candidate.  I will try to arrange for this.  2. [name]: Stage 3.  Will have to follow creatinine closely with increase in torsemide.  3. OSA: Strongly suspect OSA with fatigue, snoring, and daytime sleepines.  I will order a home sleep study.  4. Leg/foot ulcers: Probably venous stasis ulcerations.  Unable to obtain ABIs in 11/19 as noncompressible.  - I will arrange for peripheral arterial dopplers to get a better evaluation for PAD contributing to ulcerations.  5. Recurrent PNA: ?Aspiration.  6. LBBB: Chronic.  7. Ascending aortic aneurysm: CT chest 6/20 with 5.1 cm ascending aortic aneurysm.  He currently would be a poor surgical candidate.   Loralie Champagne 04/05/2019

## 2019-04-06 ENCOUNTER — Telehealth (HOSPITAL_COMMUNITY): Payer: Self-pay

## 2019-04-06 NOTE — Telephone Encounter (Signed)
Order, OV note, stop bang and demographics all faxed to [facility] at [phone] via epic  

## 2019-04-07 ENCOUNTER — Other Ambulatory Visit (HOSPITAL_COMMUNITY): Payer: Medicare Other

## 2019-04-07 ENCOUNTER — Other Ambulatory Visit: Payer: Self-pay

## 2019-04-07 ENCOUNTER — Ambulatory Visit (HOSPITAL_COMMUNITY)
Admission: RE | Admit: 2019-04-07 | Discharge: 2019-04-07 | Disposition: A | Discharge status: Home / Self Care | Payer: Medicare Other | Source: Ambulatory Visit | Attending: Internal Medicine | Admitting: Internal Medicine

## 2019-04-07 DIAGNOSIS — I5022 Chronic systolic (congestive) heart failure: Secondary | ICD-10-CM | POA: Insufficient documentation

## 2019-04-07 LAB — BASIC METABOLIC PANEL
Anion gap: 11 (ref 5–15)
BUN: 35 mg/dL — ABNORMAL HIGH (ref 8–23)
CO2: 30 mmol/L (ref 22–32)
Calcium: 8.3 mg/dL — ABNORMAL LOW (ref 8.9–10.3)
Chloride: 94 mmol/L — ABNORMAL LOW (ref 98–111)
Creatinine, Ser: 1.8 mg/dL — ABNORMAL HIGH (ref 0.61–1.24)
GFR calc Af Amer: 44 mL/min — ABNORMAL LOW (ref >60)
GFR calc non Af Amer: 38 mL/min — ABNORMAL LOW (ref >60)
Glucose, Bld: 304 mg/dL — ABNORMAL HIGH (ref 70–99)
Potassium: 4.3 mmol/L (ref 3.5–5.1)
Sodium: 135 mmol/L (ref 135–145)

## 2019-04-08 ENCOUNTER — Other Ambulatory Visit (HOSPITAL_COMMUNITY): Payer: Self-pay | Admitting: Cardiology

## 2019-04-08 ENCOUNTER — Other Ambulatory Visit: Payer: Self-pay | Admitting: *Deleted

## 2019-04-08 DIAGNOSIS — L97529 Non-pressure chronic ulcer of other part of left foot with unspecified severity: Secondary | ICD-10-CM

## 2019-04-08 DIAGNOSIS — L97519 Non-pressure chronic ulcer of other part of right foot with unspecified severity: Secondary | ICD-10-CM

## 2019-04-09 NOTE — Progress Notes (Signed)
Buelah ManisENLEY, Damone ([id number]) Visit Report for 04/03/2019 Arrival Information Details Patient Name: Date of Service: Buelah ManisENLEY, Elver 04/03/2019 4:00 PM Medical Record [id number] Patient Account Number: [id number] Date of Birth/Sex: Treating RN: 01/22/1951 (68 y.o. Katherina RightM) Dwiggins, Shannon Primary Care Tynan Boesel: CLINIC, MEDICAL Other Clinician: Referring Jeanise Durfey: Treating Naria Abbey/Extender:Robson, Duard LarsenMichael Weaver, Sherlon HandingScott Weeks in Treatment: 1 Visit Information History Since Last Visit Added or deleted any medications: No Patient Arrived: Ambulatory Any new allergies or adverse reactions: No Arrival Time: 16:33 Had a fall or experienced change in No Accompanied By: family activities of daily living that may affect member risk of falls: Transfer Assistance: None Signs or symptoms of abuse/neglect since No Patient Requires Transmission-Based No last visito Precautions: Hospitalized since last visit: No Patient Has Alerts: No Implantable device outside of the clinic No excluding cellular tissue based products placed in the center since last visit: Has Dressing in Place as Prescribed: Yes Has Compression in Place as Prescribed: Yes Has Footwear/Offloading in Place as Yes Prescribed: Left: Wedge Shoe Right: Wedge Shoe Pain Present Now: No Electronic Signature(s) Signed: 04/09/2019 4:29:59 PM By: Cherylin Mylarwiggins, Shannon Entered By: Cherylin Mylarwiggins, Shannon on 04/03/2019 16:34:17 -------------------------------------------------------------------------------- Compression Therapy Details Patient Name: Date of Service: Buelah ManisENLEY, Fielding 04/03/2019 4:00 PM Medical Record [id number] Patient Account Number: [id number] Date of Birth/Sex: Treating RN: 05/15/1951 (68 y.o. Damaris SchoonerM) Boehlein, Linda Primary Care Annabella Elford: CLINIC, MEDICAL Other Clinician: Referring Jamey Harman: Treating Niaya Hickok/Extender:Robson, Duard LarsenMichael Weaver, Sherlon HandingScott Weeks in Treatment: 1 Compression Therapy Performed for Wound Wound #1  Right,Circumferential Lower Leg Assessment: Performed By: Clinician Shawn Stalleaton, Bobbi, RN Compression Type: Three Layer Post Procedure Diagnosis Same as Pre-procedure Electronic Signature(s) Signed: 04/03/2019 7:05:19 PM By: Zenaida DeedBoehlein, Linda RN, BSN Entered By: Zenaida DeedBoehlein, Linda on 04/03/2019 17:35:02 -------------------------------------------------------------------------------- Compression Therapy Details Patient Name: Date of Service: Buelah ManisENLEY, Sotirios 04/03/2019 4:00 PM Medical Record [id number] Patient Account Number: [id number] Date of Birth/Sex: Treating RN: 10/18/1950 (68 y.o. Damaris SchoonerM) Boehlein, Linda Primary Care Sissi Padia: CLINIC, MEDICAL Other Clinician: Referring Hazel Wrinkle: Treating Jovian Lembcke/Extender:Robson, Duard LarsenMichael Weaver, Sherlon HandingScott Weeks in Treatment: 1 Compression Therapy Performed for Wound Wound #2 Left,Circumferential Lower Leg Assessment: Performed By: Clinician Shawn Stalleaton, Bobbi, RN Compression Type: Three Layer Post Procedure Diagnosis Same as Pre-procedure Electronic Signature(s) Signed: 04/03/2019 7:05:19 PM By: Zenaida DeedBoehlein, Linda RN, BSN Entered By: Zenaida DeedBoehlein, Linda on 04/03/2019 17:35:02 -------------------------------------------------------------------------------- Encounter Discharge Information Details Patient Name: Date of Service: Buelah ManisENLEY, John 04/03/2019 4:00 PM Medical Record [id number] Patient Account Number: [id number] Date of Birth/Sex: Treating RN: 10/14/1950 (68 y.o. Katherina RightM) Dwiggins, Shannon Primary Care Shavonte Zhao: CLINIC, MEDICAL Other Clinician: Referring Stalin Gruenberg: Treating Paisleigh Maroney/Extender:Robson, Duard LarsenMichael Weaver, Sherlon HandingScott Weeks in Treatment: 1 Encounter Discharge Information Items Discharge Condition: Stable Ambulatory Status: Ambulatory Discharge Destination: Home Transportation: Private Auto Accompanied By: family member Schedule Follow-up Appointment: Yes Clinical Summary of Care: Patient Declined Electronic Signature(s) Signed: 04/09/2019 4:29:59 PM  By: Cherylin Mylarwiggins, Shannon Entered By: Cherylin Mylarwiggins, Shannon on 04/03/2019 17:58:50 -------------------------------------------------------------------------------- Lower Extremity Assessment Details Patient Name: Date of Service: Buelah ManisENLEY, Alireza 04/03/2019 4:00 PM Medical Record [id number] Patient Account Number: [id number] Date of Birth/Sex: Treating RN: 05/07/1951 (68 y.o. Katherina RightM) Dwiggins, Shannon Primary Care Yatzari Jonsson: CLINIC, MEDICAL Other Clinician: Referring Robi Dewolfe: Treating Barnabas Henriques/Extender:Robson, Duard LarsenMichael Weaver, Sherlon HandingScott Weeks in Treatment: 1 Edema Assessment Assessed: [Left: No] [Right: No] Edema: [Left: Yes] [Right: Yes] Calf Left: Right: Point of Measurement: 42 cm From Medial Instep 35 cm 35 cm Ankle Left: Right: Point of Measurement: 10 cm From Medial Instep 27 cm 30.5 cm Vascular Assessment Pulses: Dorsalis Pedis Palpable: [Left:Yes] [Right:Yes] Electronic Signature(s) Signed: 04/09/2019 4:29:59 PM By: Cherylin Mylarwiggins, Shannon Entered By: Cherylin Mylarwiggins, Shannon on 04/03/2019  16:46:06 -------------------------------------------------------------------------------- Multi Wound Chart Details Patient Name: Date of Service: TYQUAVIOUS, GAMEL 04/03/2019 4:00 PM Medical Record [id number] Patient Account Number: [id number] Date of Birth/Sex: Treating RN: 1951/04/21 (68 y.o. Damaris Schooner Primary Care Vladislav Axelson: CLINIC, MEDICAL Other Clinician: Referring Ziyon Cedotal: Treating Berkeley Vanaken/Extender:Robson, Duard Larsen, Sherlon Handing in Treatment: 1 Vital Signs Height(in): 67 Pulse(bpm): 81 Weight(lbs): 222 Blood Pressure(mmHg): 104/62 Body Mass Index(BMI): 35 Temperature(F): 97.5 Respiratory 19 Rate(breaths/min): Photos: [1:No Photos] [2:No Photos] [3:No Photos] Wound Location: [1:Right Lower Leg - Circumferential] [2:Left Lower Leg - Circumferential] [3:Right Foot - Dorsal] Wounding Event: [1:Gradually Appeared] [2:Gradually Appeared] [3:Gradually Appeared] Primary Etiology:  [1:Venous Leg Ulcer] [2:Venous Leg Ulcer] [3:Diabetic Wound/Ulcer of the Lower Extremity] Comorbid History: [1:Congestive Heart Failure, Congestive Heart Failure, Coronary Artery Disease, Coronary Artery Disease, Hypertension, Type II Diabetes] [2:Hypertension, Type II Diabetes] [3:Congestive Heart Failure, Coronary Artery Disease,  Hypertension, Type II Diabetes] Date Acquired: [1:01/31/2019] [2:01/31/2019] [3:01/31/2019] Weeks of Treatment: [1:1] [2:1] [3:1] Wound Status: [1:Open] [2:Open] [3:Open] Measurements L x W x D 15x27x0.1 [2:6.5x16x0.1] [3:0.5x3.5x0.1] (cm) Area (cm) : [1:318.086] [2:81.681] [3:1.374] Volume (cm) : [1:31.809] [2:8.168] [3:0.137] % Reduction in Area: [1:33.80%] [2:76.10%] [3:88.30%] % Reduction in Volume: 33.80% [2:76.10%] [3:88.40%] Classification: [1:Full Thickness Without Exposed Support Structures Exposed Support Structures] [2:Full Thickness Without] [3:Grade 2] Exudate Amount: [1:Medium] [2:Medium] [3:Large] Exudate Type: [1:Serous] [2:Serous] [3:Serous] Exudate Color: [1:amber] [2:amber] [3:amber] Wound Margin: [1:Flat and Intact] [2:Flat and Intact] [3:Flat and Intact] Granulation Amount: [1:Large (67-100%)] [2:Large (67-100%)] [3:Large (67-100%)] Granulation Quality: [1:Pink] [2:Pink] [3:Pink] Necrotic Amount: [1:Small (1-33%)] [2:Small (1-33%)] [3:None Present (0%)] Exposed Structures: [1:Fat Layer (Subcutaneous Fat Layer (Subcutaneous Fat Layer (Subcutaneous Tissue) Exposed: Yes Fascia: No Tendon: No Muscle: No Joint: No Bone: No] [2:Tissue) Exposed: Yes Fascia: No Tendon: No Muscle: No Joint: No Bone: No] [3:Tissue) Exposed: Yes  Fascia: No Tendon: No Muscle: No Joint: No Bone: No] Epithelialization: [1:Medium (34-66%)] [2:Medium (34-66%)] [3:Medium (34-66%)] Procedures Performed: Compression Therapy [2:Compression Therapy 4] [3:N/A 5 N/A] Photos: [1:No Photos] [2:No Photos] [3:N/A] Wound Location: [1:Left Foot - Dorsal] [2:Left Toe Great]  [3:N/A] Wounding Event: [1:Gradually Appeared] [2:Gradually Appeared] [3:N/A] Primary Etiology: [1:Diabetic Wound/Ulcer of the Diabetic Wound/Ulcer of the N/A Lower Extremity] [2:Lower Extremity] Comorbid History: [1:Congestive Heart Failure, Coronary Artery Disease, Hypertension, Type II Diabetes] [2:Congestive Heart Failure, Coronary Artery Disease, Hypertension, Type II Diabetes] [3:N/A] Date Acquired: [1:01/31/2019] [2:01/31/2019] [3:N/A] Weeks of Treatment: [1:1] [2:1] [3:N/A] Wound Status: [1:Open] [2:Open] [3:N/A] Measurements L x W x D 0x0x0 [2:0x0x0] [3:N/A] (cm) Area (cm) : [1:0] [2:0] [3:N/A] Volume (cm) : [1:0] [2:0] [3:N/A] % Reduction in Area: [1:100.00%] [2:100.00%] [3:N/A] % Reduction in Volume: 100.00% [2:100.00%] [3:N/A] Classification: [1:Grade 2] [2:Grade 2] [3:N/A] Exudate Amount: [1:None Present] [2:None Present] [3:N/A] Exudate Type: [1:N/A] [2:N/A] [3:N/A] Exudate Color: [1:N/A] [2:N/A] [3:N/A] Wound Margin: [1:Flat and Intact] [2:Flat and Intact] [3:N/A] Granulation Amount: [1:None Present (0%)] [2:None Present (0%)] [3:N/A] Granulation Quality: [1:N/A] [2:N/A] [3:N/A] Necrotic Amount: [1:None Present (0%)] [2:None Present (0%)] [3:N/A] Exposed Structures: [1:Fascia: No Fat Layer (Subcutaneous Tissue) Exposed: No Tendon: No Muscle: No Joint: No Bone: No] [2:Fascia: No Fat Layer (Subcutaneous Tissue) Exposed: No Tendon: No Muscle: No Joint: No Bone: No] [3:N/A] Epithelialization: [1:Large (67-100%)] [2:Large (67-100%) N/A] [3:N/A N/A] Treatment Notes Wound #1 (Right, Circumferential Lower Leg) 1. Cleanse With Wound Cleanser Soap and water 2. Periwound Care Moisturizing lotion TCA Cream 3. Primary Dressing Applied Calcium Alginate Ag 4. Secondary Dressing ABD Pad Kerramax/Xtrasorb 6. Support Layer Applied 3 layer compression wrap Notes stretch net Wound #2 (Left, Circumferential Lower Leg) 1.  Cleanse With Wound Cleanser Soap and water 2. Periwound  Care Moisturizing lotion TCA Cream 3. Primary Dressing Applied Calcium Alginate Ag 4. Secondary Dressing ABD Pad Kerramax/Xtrasorb 6. Support Layer Applied 3 layer compression wrap Notes stretch net Wound #3 (Right, Dorsal Foot) 1. Cleanse With Wound Cleanser Soap and water 2. Periwound Care Moisturizing lotion TCA Cream 3. Primary Dressing Applied Calcium Alginate Ag 4. Secondary Dressing ABD Pad Kerramax/Xtrasorb 6. Support Layer Applied 3 layer compression wrap Notes stretch net Electronic Signature(s) Signed: 04/03/2019 6:19:43 PM By: Linton Ham MD Signed: 04/03/2019 7:05:19 PM By: Baruch Gouty RN, BSN Entered By: Linton Ham on 04/03/2019 18:09:37 -------------------------------------------------------------------------------- Multi-Disciplinary Care Plan Details Patient Name: Date of Service: HANIEL, FIX 04/03/2019 4:00 PM Medical Record [id number] Patient Account Number: [id number] Date of Birth/Sex: Treating RN: 10/22/1950 (68 y.o. Ernestene Mention Primary Care Miklo Aken: CLINIC, MEDICAL Other Clinician: Referring Delante Karapetyan: Treating Arbell Wycoff/Extender:Robson, Juliene Pina, Nat Math in Treatment: 1 Active Inactive Nutrition Nursing Diagnoses: Impaired glucose control: actual or potential Potential for alteratiion in Nutrition/Potential for imbalanced nutrition Goals: Patient/caregiver verbalizes understanding of need to maintain therapeutic glucose control per primary care physician Date Initiated: 03/25/2019 Target Resolution Date: 04/22/2019 Goal Status: Active Patient/caregiver will maintain therapeutic glucose control Date Initiated: 03/25/2019 Target Resolution Date: 04/22/2019 Goal Status: Active Interventions: Assess HgA1c results as ordered upon admission and as needed Assess patient nutrition upon admission and as needed per policy Provide education on elevated blood sugars and impact on wound healing Treatment  Activities: Patient referred to Primary Care Physician for further nutritional evaluation : 03/25/2019 Notes: Venous Leg Ulcer Nursing Diagnoses: Knowledge deficit related to disease process and management Potential for venous Insuffiency (use before diagnosis confirmed) Goals: Patient will maintain optimal edema control Date Initiated: 03/25/2019 Target Resolution Date: 04/22/2019 Goal Status: Active Interventions: Assess peripheral edema status every visit. Compression as ordered Treatment Activities: Therapeutic compression applied : 03/25/2019 Notes: Wound/Skin Impairment Nursing Diagnoses: Impaired tissue integrity Knowledge deficit related to ulceration/compromised skin integrity Goals: Patient/caregiver will verbalize understanding of skin care regimen Date Initiated: 03/25/2019 Target Resolution Date: 04/22/2019 Goal Status: Active Ulcer/skin breakdown will have a volume reduction of 30% by week 4 Date Initiated: 03/25/2019 Target Resolution Date: 04/22/2019 Goal Status: Active Interventions: Assess patient/caregiver ability to obtain necessary supplies Assess patient/caregiver ability to perform ulcer/skin care regimen upon admission and as needed Assess ulceration(s) every visit Treatment Activities: Skin care regimen initiated : 03/25/2019 Topical wound management initiated : 03/25/2019 Notes: Electronic Signature(s) Signed: 04/03/2019 7:05:19 PM By: Baruch Gouty RN, BSN Entered By: Baruch Gouty on 04/03/2019 17:09:44 -------------------------------------------------------------------------------- Pain Assessment Details Patient Name: Date of Service: AYMAN, BRULL 04/03/2019 4:00 PM Medical Record [id number] Patient Account Number: [id number] Date of Birth/Sex: Treating RN: 23-May-1951 (68 y.o. Marvis Repress Primary Care Mariya Mottley: CLINIC, MEDICAL Other Clinician: Referring Chakita Mcgraw: Treating Saharra Santo/Extender:Robson, Juliene Pina,  Nat Math in Treatment: 1 Active Problems Location of Pain Severity and Description of Pain Patient Has Paino No Site Locations Pain Management and Medication Current Pain Management: Electronic Signature(s) Signed: 04/09/2019 4:29:59 PM By: Kela Millin Entered By: Kela Millin on 04/03/2019 16:38:54 -------------------------------------------------------------------------------- Patient/Caregiver Education Details Patient Name: Date of Service: Armida Sans 10/2/2020andnbsp4:00 PM Medical Record [id number] Patient Account Number: [id number] Date of Birth/Gender: [birth year] (68 y.o. M) Treating RN: Baruch Gouty Primary Care Physician: CLINIC, MEDICAL Other Clinician: Referring Physician: Treating Physician/Extender:Robson, Juliene Pina, Nat Math in Treatment: 1 Education Assessment Education Provided To: Patient Education Topics Provided Venous: Methods: Explain/Verbal Responses: Reinforcements needed, State content correctly Wound/Skin Impairment: Methods: Explain/Verbal Responses: Reinforcements  needed, State content correctly Electronic Signature(s) Signed: 04/03/2019 7:05:19 PM By: Zenaida Deed RN, BSN Entered By: Zenaida Deed on 04/03/2019 17:10:15 -------------------------------------------------------------------------------- Wound Assessment Details Patient Name: Date of Service: SHERARD, SUTCH 04/03/2019 4:00 PM Medical Record [id number] Patient Account Number: [id number] Date of Birth/Sex: Treating RN: Jun 30, 1951 (68 y.o. Katherina Right Primary Care Meko Bellanger: CLINIC, MEDICAL Other Clinician: Referring Maikel Neisler: Treating Hai Grabe/Extender:Robson, Duard Larsen, Sherlon Handing in Treatment: 1 Wound Status Wound Number: 1 Primary Venous Leg Ulcer Etiology: Wound Location: Right Lower Leg - Circumferential Wound Open Wounding Event: Gradually Appeared Status: Date Acquired: 01/31/2019 Comorbid Congestive Heart Failure,  Coronary Artery Weeks Of Treatment: 1 History: Disease, Hypertension, Type II Diabetes Clustered Wound: No Photos Wound Measurements Length: (cm) 15 % Reduct Width: (cm) 27 % Reduct Depth: (cm) 0.1 Epitheli Area: (cm) 318.086 Tunneli Volume: (cm) 31.809 Undermi Wound Description Classification: Full Thickness Without Exposed Support Foul Odo Structures Slough/F Wound Flat and Intact Margin: Exudate Medium Amount: Exudate Serous Type: Exudate amber Color: Wound Bed Granulation Amount: Large (67-100%) Granulation Quality: Pink Fascia E Necrotic Amount: Small (1-33%) Fat Laye Necrotic Quality: Adherent Slough Tendon E Muscle E Joint Ex Bone Exp r After Cleansing: No ibrino Yes Exposed Structure xposed: No r (Subcutaneous Tissue) Exposed: Yes xposed: No xposed: No posed: No osed: No ion in Area: 33.8% ion in Volume: 33.8% alization: Medium (34-66%) ng: No ning: No Treatment Notes Wound #1 (Right, Circumferential Lower Leg) 1. Cleanse With Wound Cleanser Soap and water 2. Periwound Care Moisturizing lotion TCA Cream 3. Primary Dressing Applied Calcium Alginate Ag 4. Secondary Dressing ABD Pad Kerramax/Xtrasorb 6. Support Layer Applied 3 layer compression wrap Notes stretch net Electronic Signature(s) Signed: 04/07/2019 12:19:24 PM By: Benjaman Kindler EMT/HBOT Signed: 04/09/2019 4:29:59 PM By: Cherylin Mylar Entered By: Benjaman Kindler on 04/07/2019 10:23:29 -------------------------------------------------------------------------------- Wound Assessment Details Patient Name: Date of Service: DANIS, PEMBLETON 04/03/2019 4:00 PM Medical Record [id number] Patient Account Number: [id number] Date of Birth/Sex: Treating RN: 1951-05-27 (68 y.o. Katherina Right Primary Care Zimere Dunlevy: CLINIC, MEDICAL Other Clinician: Referring Layloni Fahrner: Treating Amirr Achord/Extender:Robson, Duard Larsen, Sherlon Handing in Treatment: 1 Wound Status Wound Number: 2  Primary Venous Leg Ulcer Etiology: Wound Location: Left Lower Leg - Circumferential Wound Open Wounding Event: Gradually Appeared Status: Date Acquired: 01/31/2019 Comorbid Congestive Heart Failure, Coronary Artery Weeks Of Treatment: 1 History: Disease, Hypertension, Type II Diabetes Clustered Wound: No Photos Wound Measurements Length: (cm) 6.5 % Reduct Width: (cm) 16 % Reduct Depth: (cm) 0.1 Epitheli Area: (cm) 81.681 Tunneli Volume: (cm) 8.168 Undermi Wound Description Classification: Full Thickness Without Exposed Support Foul Od Structures Slough/ Wound Flat and Intact Margin: Exudate Medium Amount: Exudate Serous Type: Exudate amber Color: Wound Bed Granulation Amount: Large (67-100%) Granulation Quality: Pink Fascia Necrotic Amount: Small (1-33%) Fat Layer ( Necrotic Quality: Adherent Slough Tendon Expo Muscle Expo Joint Expos Bone Expose or After Cleansing: No Fibrino Yes Exposed Structure Exposed: No Subcutaneous Tissue) Exposed: Yes sed: No sed: No ed: No d: No ion in Area: 76.1% ion in Volume: 76.1% alization: Medium (34-66%) ng: No ning: No Treatment Notes Wound #2 (Left, Circumferential Lower Leg) 1. Cleanse With Wound Cleanser Soap and water 2. Periwound Care Moisturizing lotion TCA Cream 3. Primary Dressing Applied Calcium Alginate Ag 4. Secondary Dressing ABD Pad Kerramax/Xtrasorb 6. Support Layer Applied 3 layer compression wrap Notes stretch net Electronic Signature(s) Signed: 04/07/2019 12:19:24 PM By: Benjaman Kindler EMT/HBOT Signed: 04/09/2019 4:29:59 PM By: Cherylin Mylar Entered By: Benjaman Kindler on 04/07/2019 10:40:35 -------------------------------------------------------------------------------- Wound Assessment Details Patient Name: Date  of Service: LEUL, NARRAMORE 04/03/2019 4:00 PM Medical Record [id number] Patient Account Number: [id number] Date of Birth/Sex: Treating RN: 1951/06/07 (68 y.o. Katherina Right Primary Care Trajon Rosete: CLINIC, MEDICAL Other Clinician: Referring Shenae Bonanno: Treating Ileta Ofarrell/Extender:Robson, Duard Larsen, Sherlon Handing in Treatment: 1 Wound Status Wound Number: 3 Primary Diabetic Wound/Ulcer of the Lower Extremity Etiology: Wound Location: Right Foot - Dorsal Wound Open Wounding Event: Gradually Appeared Status: Date Acquired: 01/31/2019 Comorbid Congestive Heart Failure, Coronary Artery Weeks Of Treatment: 1 History: Disease, Hypertension, Type II Diabetes Clustered Wound: No Photos Wound Measurements Length: (cm) 0.5 Width: (cm) 3.5 Depth: (cm) 0.1 Area: (cm) 1.374 Volume: (cm) 0.137 Wound Description Classification: Grade 2 Wound Margin: Flat and Intact Exudate Amount: Large Exudate Type: Serous Exudate Color: amber Wound Bed Granulation Amount: Large (67-100%) Granulation Quality: Pink Necrotic Amount: None Present (0%) ter Cleansing: No no Yes Exposed Structure ed: No ubcutaneous Tissue) Exposed: Yes ed: No ed: No d: No : No % Reduction in Area: 88.3% % Reduction in Volume: 88.4% Epithelialization: Medium (34-66%) Tunneling: No Undermining: No Foul Odor Af Slough/Fibri Fascia Expos Fat Layer (S Tendon Expos Muscle Expos Joint Expose Bone Exposed Treatment Notes Wound #3 (Right, Dorsal Foot) 1. Cleanse With Wound Cleanser Soap and water 2. Periwound Care Moisturizing lotion TCA Cream 3. Primary Dressing Applied Calcium Alginate Ag 4. Secondary Dressing ABD Pad Kerramax/Xtrasorb 6. Support Layer Applied 3 layer compression wrap Notes stretch net Electronic Signature(s) Signed: 04/07/2019 12:19:24 PM By: Benjaman Kindler EMT/HBOT Signed: 04/09/2019 4:29:59 PM By: Cherylin Mylar Entered By: Benjaman Kindler on 04/07/2019 10:23:55 -------------------------------------------------------------------------------- Wound Assessment Details Patient Name: Date of Service: BASEL, DEFALCO 04/03/2019 4:00 PM Medical  Record [id number] Patient Account Number: [id number] Date of Birth/Sex: Treating RN: 08/22/1950 (68 y.o. Katherina Right Primary Care Arynn Armand: CLINIC, MEDICAL Other Clinician: Referring Shlonda Dolloff: Treating Tyteanna Ost/Extender:Robson, Duard Larsen, Sherlon Handing in Treatment: 1 Wound Status Wound Number: 4 Primary Diabetic Wound/Ulcer of the Lower Extremity Etiology: Wound Location: Left Foot - Dorsal Wound Healed - Epithelialized Wounding Event: Gradually Appeared Status: Date Acquired: 01/31/2019 Comorbid Congestive Heart Failure, Coronary Artery Weeks Of Treatment: 1 History: Disease, Hypertension, Type II Diabetes Clustered Wound: No Photos Wound Measurements Length: (cm) 0 % Reduction Width: (cm) 0 % Reduction Depth: (cm) 0 Epithelializ Area: (cm) 0 Tunneling: Volume: (cm) 0 Undermining Wound Description Classification: Grade 2 Foul Odor Af Wound Margin: Flat and Intact Slough/Fibri Exudate Amount: None Present Wound Bed Granulation Amount: None Present (0%) Necrotic Amount: None Present (0%) Fascia Expos Fat Layer (S Tendon Expos Muscle Expos Joint Expose Bone Exposed ter Cleansing: No no No Exposed Structure ed: No ubcutaneous Tissue) Exposed: No ed: No ed: No d: No : No in Area: 100% in Volume: 100% ation: Large (67-100%) No : No Electronic Signature(s) Signed: 04/07/2019 12:19:24 PM By: Benjaman Kindler EMT/HBOT Signed: 04/09/2019 4:29:59 PM By: Cherylin Mylar Entered By: Benjaman Kindler on 04/07/2019 10:40:16 -------------------------------------------------------------------------------- Wound Assessment Details Patient Name: Date of Service: LEWIN, PELLOW 04/03/2019 4:00 PM Medical Record [id number] Patient Account Number: [id number] Date of Birth/Sex: Treating RN: June 20, 1951 (68 y.o. Katherina Right Primary Care Demonica Farrey: CLINIC, MEDICAL Other Clinician: Referring Shavar Gorka: Treating Angelyse Heslin/Extender:Robson,  Duard Larsen, Sherlon Handing in Treatment: 1 Wound Status Wound Number: 5 Primary Diabetic Wound/Ulcer of the Lower Extremity Etiology: Wound Location: Left Toe Great Wound Healed - Epithelialized Wounding Event: Gradually Appeared Status: Date Acquired: 01/31/2019 Comorbid Congestive Heart Failure, Coronary Artery Weeks Of Treatment: 1 History: Disease, Hypertension, Type II Diabetes Clustered Wound: No Photos Wound Measurements Length: (cm) 0 %  Reduction i Width: (cm) 0 % Reduction i Depth: (cm) 0 Epithelializa Area: (cm) 0 Tunneling: Volume: (cm) 0 Undermining: Wound Description Classification: Grade 2 Foul Odor Af Wound Margin: Flat and Intact Slough/Fibri Exudate Amount: None Present Wound Bed Granulation Amount: None Present (0%) Necrotic Amount: None Present (0%) Fascia Expos Fat Layer (S Tendon Expos Muscle Expos Joint Expose Bone Exposed ter Cleansing: No no No Exposed Structure ed: No ubcutaneous Tissue) Exposed: No ed: No ed: No d: No : No n Area: 100% n Volume: 100% tion: Large (67-100%) No No Electronic Signature(s) Signed: 04/07/2019 12:19:24 PM By: Benjaman Kindler EMT/HBOT Signed: 04/09/2019 4:29:59 PM By: Cherylin Mylar Entered By: Benjaman Kindler on 04/07/2019 10:41:00 -------------------------------------------------------------------------------- Vitals Details Patient Name: Date of Service: REILLEY, LATORRE 04/03/2019 4:00 PM Medical Record [id number] Patient Account Number: [id number] Date of Birth/Sex: Treating RN: 04-Dec-1950 (68 y.o. Katherina Right Primary Care Zareen Jamison: CLINIC, MEDICAL Other Clinician: Referring Rider Ermis: Treating Eldana Isip/Extender:Robson, Duard Larsen, Sherlon Handing in Treatment: 1 Vital Signs Time Taken: 16:35 Temperature (F): 97.5 Height (in): 67 Pulse (bpm): 81 Weight (lbs): 222 Respiratory Rate (breaths/min): 19 Body Mass Index (BMI): 34.8 Blood Pressure (mmHg): 104/62 Reference Range: 80  - 120 mg / dl Electronic Signature(s) Signed: 04/09/2019 4:29:59 PM By: Cherylin Mylar Entered By: Cherylin Mylar on 04/03/2019 16:38:43

## 2019-04-10 ENCOUNTER — Other Ambulatory Visit: Payer: Self-pay

## 2019-04-10 ENCOUNTER — Encounter (HOSPITAL_BASED_OUTPATIENT_CLINIC_OR_DEPARTMENT_OTHER): Payer: Medicare Other | Admitting: Internal Medicine

## 2019-04-10 DIAGNOSIS — E11621 Type 2 diabetes mellitus with foot ulcer: Secondary | ICD-10-CM | POA: Diagnosis not present

## 2019-04-10 NOTE — Progress Notes (Addendum)
CALEM, COCOZZA ([id number]) Visit Report for 04/10/2019 Arrival Information Details Patient Name: Date of Service: [name], [name] 04/10/2019 4:00 PM Medical Record [id number] Patient Account Number: [id number] Date of Birth/Sex: Treating RN: 09-Nov-1950 (68 y.o. Male) Cherylin Mylar Primary Care Randie Tallarico: CLINIC, MEDICAL Other Clinician: Referring Judiann Celia: Treating Odessie Polzin/Extender:Robson, Duard Larsen, Sherlon Handing in Treatment: 2 Visit Information History Since Last Visit Added or deleted any No Patient Arrived: Ambulatory medications: Arrival Time: 16:30 Any new allergies or adverse No Accompanied By: family reactions: member Had a fall or experienced change No Transfer Assistance: None in Patient Identification Verified: Yes activities of daily living that may Secondary Verification Process Yes affect Completed: risk of falls: Patient Requires Transmission-Based No Signs or symptoms of No Precautions: abuse/neglect since last visito Patient Has Alerts: No Hospitalized since last visit: No Implantable device outside of the No clinic excluding cellular tissue based products placed in the center since last visit: Has Dressing in Place as Yes Prescribed: Has Compression in Place as Yes Prescribed: Has Footwear/Offloading in Place Yes as Prescribed: Left: Surgical Shoe with Pressure Relief Insole Right: Surgical Shoe with Pressure Relief Insole Pain Present Now: No Electronic Signature(s) Signed: 04/10/2019 5:13:27 PM By: Cherylin Mylar Entered By: Cherylin Mylar on 04/10/2019 16:31:37 -------------------------------------------------------------------------------- Clinic Level of Care Assessment Details Patient Name: Date of Service: [name], [name] 04/10/2019 4:00 PM Medical Record [id number] Patient Account Number: [id number] Date of Birth/Sex: Treating RN: 13-Oct-1950 (68 y.o. Male) Zandra Abts Primary Care Arizona Sorn: CLINIC,  MEDICAL Other Clinician: Referring Linford Quintela: Treating Kace Hartje/Extender:Robson, Duard Larsen, Sherlon Handing in Treatment: 2 Clinic Level of Care Assessment Items TOOL 4 Quantity Score X - Use when only an EandM is performed on FOLLOW-UP visit 1 0 ASSESSMENTS - Nursing Assessment / Reassessment X - Reassessment of Co-morbidities (includes updates in patient status) 1 10 X - Reassessment of Adherence to Treatment Plan 1 5 ASSESSMENTS - Wound and Skin Assessment / Reassessment X - Simple Wound Assessment / Reassessment - one wound 1 5  - Complex Wound Assessment / Reassessment - multiple wounds 0  - Dermatologic / Skin Assessment (not related to wound area) 0 ASSESSMENTS - Focused Assessment  - Circumferential Edema Measurements - multi extremities 0  - Nutritional Assessment / Counseling / Intervention 0 X - Lower Extremity Assessment (monofilament, tuning fork, pulses) 1 5  - Peripheral Arterial Disease Assessment (using hand held doppler) 0 ASSESSMENTS - Ostomy and/or Continence Assessment and Care  - Incontinence Assessment and Management 0  - Ostomy Care Assessment and Management (repouching, etc.) 0 PROCESS - Coordination of Care X - Simple Patient / Family Education for ongoing care 1 15  - Complex (extensive) Patient / Family Education for ongoing care 0 X - Staff obtains Chiropractor, Records, Test Results / Process Orders 1 10  - Staff telephones HHA, Nursing Homes / Clarify orders / etc 0  - Routine Transfer to another Facility (non-emergent condition) 0  - Routine Hospital Admission (non-emergent condition) 0  - New Admissions / Manufacturing engineer / Ordering NPWT, Apligraf, etc. 0  - Emergency Hospital Admission (emergent condition) 0 X - Simple Discharge Coordination 1 10  - Complex (extensive) Discharge Coordination 0 PROCESS - Special Needs  - Pediatric / Minor Patient Management 0  - Isolation Patient Management 0  - Hearing /  Language / Visual special needs 0  - Assessment of Community assistance (transportation, D/C planning, etc.) 0  - Additional assistance / Altered mentation 0  - Support Surface(s) Assessment (bed, cushion, seat, etc.) 0 INTERVENTIONS - Wound Cleansing /  Measurement X - Simple Wound Cleansing - one wound 1 5  - Complex Wound Cleansing - multiple wounds 0 X - Wound Imaging (photographs - any number of wounds) 1 5  - Wound Tracing (instead of photographs) 0 X - Simple Wound Measurement - one wound 1 5  - Complex Wound Measurement - multiple wounds 0 INTERVENTIONS - Wound Dressings  - Small Wound Dressing one or multiple wounds 0  - Medium Wound Dressing one or multiple wounds 0  - Large Wound Dressing one or multiple wounds 0  - Application of Medications - topical 0  - Application of Medications - injection 0 INTERVENTIONS - Miscellaneous  - External ear exam 0  - Specimen Collection (cultures, biopsies, blood, body fluids, etc.) 0  - Specimen(s) / Culture(s) sent or taken to Lab for analysis 0  - Patient Transfer (multiple staff / Nurse, adult / Similar devices) 0  - Simple Staple / Suture removal (25 or less) 0  - Complex Staple / Suture removal (26 or more) 0  - Hypo / Hyperglycemic Management (close monitor of Blood Glucose) 0  - Ankle / Brachial Index (ABI) - do not check if billed separately 0 X - Vital Signs 1 5 Has the patient been seen at the hospital within the last three years: Yes Total Score: 80 Level Of Care: New/Established - Level 3 Electronic Signature(s) Signed: 04/14/2019 5:52:09 PM By: Zandra Abts RN, BSN Entered By: Zandra Abts on 04/10/2019 17:26:42 -------------------------------------------------------------------------------- Encounter Discharge Information Details Patient Name: Date of Service: [name], [name] 04/10/2019 4:00 PM Medical Record [id number] Patient Account Number: [id number] Date of  Birth/Sex: Treating RN: 30-Dec-1950 (68 y.o. Male) Zandra Abts Primary Care Laquetta Racey: CLINIC, MEDICAL Other Clinician: Referring Lakia Gritton: Treating Kym Fenter/Extender:Robson, Duard Larsen, Sherlon Handing in Treatment: 2 Encounter Discharge Information Items Discharge Condition: Stable Ambulatory Status: Ambulatory Discharge Destination: Home Transportation: Private Auto Accompanied By: wife Schedule Follow-up Appointment: Yes Clinical Summary of Care: Patient Declined Notes demonstrated and educated how to apply the juxtalites to BLE. Patient and wife in agreement. Electronic Signature(s) Signed: 04/10/2019 6:01:54 PM By: Shawn Stall Entered By: Shawn Stall on 04/10/2019 17:45:26 -------------------------------------------------------------------------------- Lower Extremity Assessment Details Patient Name: Date of Service: [name], [name] 04/10/2019 4:00 PM Medical Record [id number] Patient Account Number: [id number] Date of Birth/Sex: Treating RN: 09-23-1950 (68 y.o. Male) Cherylin Mylar Primary Care Skyeler Smola: CLINIC, MEDICAL Other Clinician: Referring Eligh Rybacki: Treating Austin Pongratz/Extender:Robson, Duard Larsen, Sherlon Handing in Treatment: 2 Edema Assessment Assessed: [Left: No] [Right: No] Edema: [Left: Yes] [Right: Yes] Calf Left: Right: Point of Measurement: 42 cm From Medial Instep 32.5 cm 30 cm Ankle Left: Right: Point of Measurement: 10 cm From Medial Instep 26.5 cm 33 cm Vascular Assessment Pulses: Dorsalis Pedis Palpable: [Left:Yes] [Right:Yes] Electronic Signature(s) Signed: 04/10/2019 5:13:27 PM By: Cherylin Mylar Entered By: Cherylin Mylar on 04/10/2019 16:34:33 -------------------------------------------------------------------------------- Multi Wound Chart Details Patient Name: Date of Service: [name], [name] 04/10/2019 4:00 PM Medical Record [id number] Patient Account Number: [id number] Date of Birth/Sex: Treating RN: 23-May-1951 (68  y.o. Male) Zandra Abts Primary Care Riely Oetken: CLINIC, MEDICAL Other Clinician: Referring Taevon Aschoff: Treating Clayten Allcock/Extender:Robson, Duard Larsen, Sherlon Handing in Treatment: 2 Vital Signs Height(in): 67 Pulse(bpm): 70 Weight(lbs): 222 Blood Pressure(mmHg): 133/79 Body Mass Index(BMI): 35 Temperature(F): 97.5 Respiratory 18 Rate(breaths/min): Photos: [1:No Photos] [2:No Photos] [3:No Photos] Wound Location: [1:Right Lower Leg - Circumferential] [2:Left Lower Leg - Circumferential] [3:Right Foot - Dorsal] Wounding Event: [1:Gradually Appeared] [2:Gradually Appeared] [3:Gradually Appeared] Primary Etiology: [1:Venous Leg Ulcer] [2:Venous Leg Ulcer] [3:Diabetic Wound/Ulcer of the Lower Extremity] Comorbid  History: [1:Congestive Heart Failure, Coronary Artery Disease, Coronary Artery Disease, Coronary Artery Disease, Hypertension, Type II Diabetes] [2:Congestive Heart Failure, Hypertension, Type II Diabetes] [3:Congestive Heart Failure,  Hypertension, Type II Diabetes] Date Acquired: [1:01/31/2019] [2:01/31/2019] [3:01/31/2019] Weeks of Treatment: [1:2] [2:2] [3:2] Wound Status: [1:Open] [2:Open] [3:Open] Measurements L x W x D 0x0x0 [2:0x0x0] [3:0x0x0] (cm) Area (cm) : [1:0] [2:0] [3:0] Volume (cm) : [1:0] [2:0] [3:0] % Reduction in Area: [1:100.00%] [2:100.00%] [3:100.00%] % Reduction in Volume: 100.00% [2:100.00%] [3:100.00%] Classification: [1:Full Thickness Without Exposed Support Structures Exposed Support Structures] [2:Full Thickness Without] [3:Grade 2] Exudate Amount: [1:None Present] [2:None Present] [3:None Present] Wound Margin: [1:Flat and Intact] [2:Flat and Intact] [3:Flat and Intact] Granulation Amount: [1:None Present (0%)] [2:None Present (0%)] [3:None Present (0%)] Necrotic Amount: [1:None Present (0%)] [2:None Present (0%)] [3:None Present (0%)] Exposed Structures: [1:Fascia: No Fat Layer (Subcutaneous Fat Layer (Subcutaneous Fat Layer (Subcutaneous Tissue)  Exposed: No Tendon: No Muscle: No Joint: No Bone: No Large (67-100%)] [2:Fascia: No Tissue) Exposed: No Tendon: No Muscle: No Joint: No Bone: No Large  (67-100%)] [3:Fascia: No Tissue) Exposed: No Tendon: No Muscle: No Joint: No Bone: No Large (67-100%)] Treatment Notes Electronic Signature(s) Signed: 04/12/2019 10:27:00 AM By: Linton Ham MD Signed: 04/14/2019 5:52:09 PM By: Levan Hurst RN, BSN Entered By: Linton Ham on 04/12/2019 06:48:58 -------------------------------------------------------------------------------- Multi-Disciplinary Care Plan Details Patient Name: Date of Service: [name], [name] 04/10/2019 4:00 PM Medical Record [id number] Patient Account Number: [id number] Date of Birth/Sex: Treating RN: 19-Apr-1951 (68 y.o. Male) Levan Hurst Primary Care Judah Carchi: CLINIC, MEDICAL Other Clinician: Referring Lynnleigh Soden: Treating Elmond Poehlman/Extender:Robson, Juliene Pina, Nat Math in Treatment: 2 Active Inactive Electronic Signature(s) Signed: 04/14/2019 5:52:09 PM By: Levan Hurst RN, BSN Entered By: Levan Hurst on 04/10/2019 17:26:08 -------------------------------------------------------------------------------- Pain Assessment Details Patient Name: Date of Service: [name], [name] 04/10/2019 4:00 PM Medical Record [id number] Patient Account Number: [id number] Date of Birth/Sex: Treating RN: 01-10-1951 (68 y.o. Male) Kela Millin Primary Care Shalva Rozycki: CLINIC, MEDICAL Other Clinician: Referring Malikye Reppond: Treating Evert Wenrich/Extender:Robson, Juliene Pina, Nat Math in Treatment: 2 Active Problems Location of Pain Severity and Description of Pain Patient Has Paino No Site Locations Pain Management and Medication Current Pain Management: Electronic Signature(s) Signed: 04/10/2019 5:13:27 PM By: Kela Millin Entered By: Kela Millin on 04/10/2019  16:33:42 -------------------------------------------------------------------------------- Patient/Caregiver Education Details Patient Name: Date of Service: Armida Sans 10/9/2020andnbsp4:00 PM Medical Record [phone] Patient Account Number: [id number] Date of Birth/Gender: 01/21/1951 (68 y.o. Male) Treating RN: Levan Hurst Primary Care Physician: CLINIC, MEDICAL Other Clinician: Referring Physician: Treating Physician/Extender:Robson, Juliene Pina, Nat Math in Treatment: 2 Education Assessment Education Provided To: Patient Education Topics Provided Venous: Methods: Explain/Verbal Responses: State content correctly Wound/Skin Impairment: Methods: Explain/Verbal Responses: State content correctly Electronic Signature(s) Signed: 04/14/2019 5:52:09 PM By: Levan Hurst RN, BSN Entered By: Levan Hurst on 04/10/2019 17:26:23 -------------------------------------------------------------------------------- Wound Assessment Details Patient Name: Date of Service: [name], [name] 04/10/2019 4:00 PM Medical Record [id number] Patient Account Number: [id number] Date of Birth/Sex: Treating RN: Nov 06, 1950 (68 y.o. Male) Kela Millin Primary Care Aastha Dayley: CLINIC, MEDICAL Other Clinician: Referring Albany Winslow: Treating Briani Maul/Extender:Robson, Juliene Pina, Nat Math in Treatment: 2 Wound Status Wound Number: 1 Primary Venous Leg Ulcer Etiology: Wound Location: Right Lower Leg - Circumferential Wound Healed - Epithelialized Wounding Event: Gradually Appeared Status: Date Acquired: 01/31/2019 Comorbid Congestive Heart Failure, Coronary Artery Weeks Of Treatment: 2 History: Disease, Hypertension, Type II Diabetes Clustered Wound: No Photos Wound Measurements Length: (cm) 0 % Reducti Width: (cm) 0 % Reducti Depth: (cm) 0 Epithelia Area: (cm) 0 Tunnelin Volume: (cm) 0  Undermin Wound Description Classification: Full Thickness Without Exposed  Support Foul Odo Structures Slough/F Wound Flat and Intact Margin: Exudate None Present Amount: Wound Bed Granulation Amount: None Present (0%) Necrotic Amount: None Present (0%) Fascia E Fat Laye Tendon E Muscle E Joint Ex Bone Exp r After Cleansing: No ibrino No Exposed Structure xposed: No r (Subcutaneous Tissue) Exposed: No xposed: No xposed: No posed: No osed: No on in Area: 100% on in Volume: 100% lization: Large (67-100%) g: No ing: No Electronic Signature(s) Signed: 04/15/2019 5:37:20 PM By: Cherylin Mylar Signed: 05/06/2019 4:02:01 PM By: Benjaman Kindler EMT/HBOT Previous Signature: 04/10/2019 5:13:27 PM Version By: Cherylin Mylar Entered By: Benjaman Kindler on 04/14/2019 09:42:12 -------------------------------------------------------------------------------- Wound Assessment Details Patient Name: Date of Service: [name], [name] 04/10/2019 4:00 PM Medical Record [id number] Patient Account Number: [id number] Date of Birth/Sex: Treating RN: 10-27-1950 (68 y.o. Male) Cherylin Mylar Primary Care Jackilyn Umphlett: CLINIC, MEDICAL Other Clinician: Referring Neetu Carrozza: Treating Sameul Tagle/Extender:Robson, Duard Larsen, Sherlon Handing in Treatment: 2 Wound Status Wound Number: 2 Primary Venous Leg Ulcer Etiology: Wound Location: Left Lower Leg - Circumferential Wound Healed - Epithelialized Wounding Event: Gradually Appeared Status: Date Acquired: 01/31/2019 Comorbid Congestive Heart Failure, Coronary Artery Weeks Of Treatment: 2 History: Disease, Hypertension, Type II Diabetes Clustered Wound: No Photos Wound Measurements Length: (cm) 0 % Reduct Width: (cm) 0 % Reduct Depth: (cm) 0 Epitheli Area: (cm) 0 Tunneli Volume: (cm) 0 Undermi Wound Description Classification: Full Thickness Without Exposed Support Foul Odo Structures Slough/F Wound Flat and Intact Margin: Exudate None Present Amount: Wound Bed Granulation Amount: None Present  (0%) Necrotic Amount: None Present (0%) Fascia E Fat Laye Tendon E Muscle E Joint Ex Bone Exp r After Cleansing: No ibrino No Exposed Structure xposed: No r (Subcutaneous Tissue) Exposed: No xposed: No xposed: No posed: No osed: No ion in Area: 100% ion in Volume: 100% alization: Large (67-100%) ng: No ning: No Electronic Signature(s) Signed: 04/15/2019 5:37:20 PM By: Cherylin Mylar Signed: 05/06/2019 4:02:01 PM By: Benjaman Kindler EMT/HBOT Previous Signature: 04/10/2019 5:13:27 PM Version By: Cherylin Mylar Entered By: Benjaman Kindler on 04/14/2019 09:42:38 -------------------------------------------------------------------------------- Wound Assessment Details Patient Name: Date of Service: [name], [name] 04/10/2019 4:00 PM Medical Record [id number] Patient Account Number: [id number] Date of Birth/Sex: Treating RN: [birth year] (68 y.o. Male) Cherylin Mylar Primary Care Laural Eiland: CLINIC, MEDICAL Other Clinician: Referring Margaretann Abate: Treating Keenan Dimitrov/Extender:Robson, Duard Larsen, Sherlon Handing in Treatment: 2 Wound Status Wound Number: 3 Primary Diabetic Wound/Ulcer of the Lower Extremity Etiology: Wound Location: Right Foot - Dorsal Wound Healed - Epithelialized Wounding Event: Gradually Appeared Status: Date Acquired: 01/31/2019 Comorbid Congestive Heart Failure, Coronary Artery Weeks Of Treatment: 2 History: Disease, Hypertension, Type II Diabetes Clustered Wound: No Photos Wound Measurements Length: (cm) 0 % Reductio Width: (cm) 0 % Reductio Depth: (cm) 0 Epithelial Area: (cm) 0 Tunneling Volume: (cm) 0 Undermini Wound Description Classification: Grade 2 Foul Odor Wound Margin: Flat and Intact Slough/Fib Exudate Amount: None Present Wound Bed Granulation Amount: None Present (0%) Necrotic Amount: None Present (0%) Fascia Exp Fat Layer Tendon Exp Muscle Exp Joint Expo Bone Expos After Cleansing: No rino No Exposed Structure osed:  No (Subcutaneous Tissue) Exposed: No osed: No osed: No sed: No ed: No n in Area: 100% n in Volume: 100% ization: Large (67-100%) : No ng: No Electronic Signature(s) Signed: 04/15/2019 5:37:20 PM By: Cherylin Mylar Signed: 05/06/2019 4:02:01 PM By: Benjaman Kindler EMT/HBOT Previous Signature: 04/10/2019 5:13:27 PM Version By: Cherylin Mylar Entered By: Benjaman Kindler on 04/14/2019 09:41:50 -------------------------------------------------------------------------------- Vitals Details Patient Name: Date  of Service: [name], [name] 04/10/2019 4:00 PM Medical Record [id number] Patient Account Number: [id number] Date of Birth/Sex: Treating RN: 12/06/1950 (68 y.o. Male) Cherylin Mylarwiggins, Shannon Primary Care Lylia Karn: CLINIC, MEDICAL Other Clinician: Referring Gwendloyn Forsee: Treating Vinnie Gombert/Extender:Robson, Duard LarsenMichael Weaver, Sherlon HandingScott Weeks in Treatment: 2 Vital Signs Time Taken: 16:28 Temperature (F): 97.5 Height (in): 67 Pulse (bpm): 70 Weight (lbs): 222 Respiratory Rate (breaths/min): 18 Body Mass Index (BMI): 34.8 Blood Pressure (mmHg): 133/79 Reference Range: 80 - 120 mg / dl Electronic Signature(s) Signed: 04/10/2019 5:13:27 PM By: Cherylin Mylarwiggins, Shannon Entered By: Cherylin Mylarwiggins, Shannon on 04/10/2019 16:33:34

## 2019-04-13 ENCOUNTER — Other Ambulatory Visit: Payer: Self-pay

## 2019-04-13 ENCOUNTER — Ambulatory Visit (HOSPITAL_COMMUNITY)
Admission: RE | Admit: 2019-04-13 | Discharge: 2019-04-13 | Disposition: A | Discharge status: Home / Self Care | Payer: Medicare Other | Source: Ambulatory Visit | Attending: Cardiology | Admitting: Cardiology

## 2019-04-13 DIAGNOSIS — L97519 Non-pressure chronic ulcer of other part of right foot with unspecified severity: Secondary | ICD-10-CM | POA: Diagnosis not present

## 2019-04-13 DIAGNOSIS — L97529 Non-pressure chronic ulcer of other part of left foot with unspecified severity: Secondary | ICD-10-CM | POA: Diagnosis present

## 2019-04-14 ENCOUNTER — Telehealth (HOSPITAL_COMMUNITY): Payer: Self-pay

## 2019-04-14 DIAGNOSIS — I739 Peripheral vascular disease, unspecified: Secondary | ICD-10-CM

## 2019-04-14 NOTE — Telephone Encounter (Signed)
-----   Message from Larey Dresser, MD sent at 04/13/2019  4:30 PM EDT ----- Please arrange peripheral arterial dopplers (not ABIs) since noncompressible vessels.

## 2019-04-14 NOTE — Progress Notes (Signed)
Harold, Waller ([id number]) Visit Report for 04/10/2019 HPI Details Patient Name: Date of Service: [name], [name] 04/10/2019 4:00 PM Medical Record [id number] Patient Account Number: [id number] Date of Birth/Sex: Treating RN: Nov 03, 1950 (68 y.o. Male) Zandra Abts Primary Care Provider: CLINIC, MEDICAL Other Clinician: Referring Provider: Treating Provider/Extender:Rahiem Schellinger, Duard Larsen, Sherlon Handing in Treatment: 2 History of Present Illness HPI Description: 03/25/2019 patient presents today for initial evaluation in our clinic concerning wounds on the bilateral lower extremities. He has a significant medical history which is positive for chronic venous insufficiency, lymphedema, diabetes mellitus type 2, congestive heart failure, hypertension, atherosclerotic heart disease, and stage III chronic kidney disease. His issue at this point is that he has had for the past 2 months problems with edema and swelling of the bilateral lower extremities and apparently this is after he had issues with his heart. His ejection fraction is 45 to 50% based on his last test. He was in the hospital with regard to this and during the time in the hospital was given Silvadene cream by the wound care ostomy nurse to be applied to the legs. No wrap was recommended according to what the patient and his wife tell me at this time. With that being said I do think that the patient likely needs bilateral compression wraps to try to help get some of the fluid out of legs and help the wounds to heal. I do not see any signs of active infection which is good news. No fevers, chills, nausea, vomiting, or diarrhea.Patient is not having severe pain although he does have some discomfort on the bilateral lower extremities with cleaning over the area. 04/03/2019; patient returns to clinic today. He looks a lot better per our nurses. Most of the areas on the left leg are healed his edema control is excellent. Still an open  area on the lower left medial leg. On the right the wounds and the swelling are a little worse than the left however everything looks improved here as well. He was able to tolerate 3 layer compression. He apparently has juxta lite stockings I asked him to bring these in next week. The area on the left leg may be healed. He tells me that his cardiologist increased his torsemide to 20 twice daily. He has a history of congestive heart failure 10/9; his wounds are healed on the bilateral lower extremities he has bilateral juxta lites Electronic Signature(s) Signed: 04/12/2019 10:27:00 AM By: Baltazar Najjar MD Entered By: Baltazar Najjar on 04/12/2019 06:49:51 -------------------------------------------------------------------------------- Physical Exam Details Patient Name: Date of Service: Waller, Harold 04/10/2019 4:00 PM Medical Record [id number] Patient Account Number: [id number] Date of Birth/Sex: Treating RN: [birth year] (68 y.o. Male) Zandra Abts Primary Care Provider: CLINIC, MEDICAL Other Clinician: Referring Provider: Treating Provider/Extender:Avram Danielson, Duard Larsen, Sherlon Handing in Treatment: 2 Constitutional Sitting or standing Blood Pressure is within target range for patient.. Pulse regular and within target range for patient.Marland Kitchen Respirations regular, non-labored and within target range.. Temperature is normal and within the target range for the patient.Marland Kitchen Appears in no distress. Eyes Conjunctivae clear. No discharge.no icterus. Respiratory work of breathing is normal. Cardiovascular Pedal pulses palpable and strong bilaterally.Marland Kitchen edema control is excellent. Integumentary (Hair, Skin) changes of chronic venous insufficiency no other skin lesions are seen. Psychiatric appears at normal baseline. Notes exam; his edema is under excellent control. There is no sign of active infection. Wounds on his bilateral lower extremities are healed and epithelialized. no evidence of  active infection Electronic Signature(s) Signed: 04/12/2019 10:27:00 AM By: Leanord Hawking,  Legrand Como MD Entered By: Linton Ham on 04/12/2019 06:52:39 -------------------------------------------------------------------------------- Physician Orders Details Patient Name: Date of Service: Waller, Harold 04/10/2019 4:00 PM Medical Record [id number] Patient Account Number: [id number] Date of Birth/Sex: Treating RN: Sep 28, 1950 (68 y.o. Male) Levan Hurst Primary Care Provider: CLINIC, MEDICAL Other Clinician: Referring Provider: Treating Provider/Extender:Ivonna Kinnick, Juliene Pina, Nat Math in Treatment: 2 Verbal / Phone Orders: No Diagnosis Coding Discharge From [facility] Discharge from [place] lotion - both legs daily Edema Control Avoid standing for long periods of time Elevate legs to the level of the heart or above for 30 minutes daily and/or when sitting, a frequency of: - throughout the day Exercise regularly Support Garment 20-30 mm/Hg pressure to: - Juxtalite to both legs daily Electronic Signature(s) Signed: 04/12/2019 10:27:00 AM By: Linton Ham MD Signed: 04/14/2019 5:52:09 PM By: Levan Hurst RN, BSN Entered By: Levan Hurst on 04/10/2019 17:25:45 -------------------------------------------------------------------------------- Problem List Details Patient Name: Date of Service: Waller, Harold 04/10/2019 4:00 PM Medical Record [id number] Patient Account Number: [id number] Date of Birth/Sex: Treating RN: 1950/11/07 (68 y.o. Male) Levan Hurst Primary Care Provider: CLINIC, MEDICAL Other Clinician: Referring Provider: Treating Provider/Extender:Deeana Atwater, Juliene Pina, Nat Math in Treatment: 2 Active Problems ICD-10 Evaluated Encounter Code Description Active Date Today Diagnosis I87.2 Venous insufficiency (chronic) (peripheral) 03/25/2019 No Yes I89.0 Lymphedema, not elsewhere classified  03/25/2019 No Yes E11.621 Type 2 diabetes mellitus with foot ulcer 03/25/2019 No Yes L97.812 Non-pressure chronic ulcer of other part of right lower 03/25/2019 No Yes leg with fat layer exposed L97.822 Non-pressure chronic ulcer of other part of left lower 03/25/2019 No Yes leg with fat layer exposed L97.522 Non-pressure chronic ulcer of other part of left foot 03/25/2019 No Yes with fat layer exposed L97.512 Non-pressure chronic ulcer of other part of right foot 03/25/2019 No Yes with fat layer exposed I50.42 Chronic combined systolic (congestive) and diastolic 8/76/8115 No Yes (congestive) heart failure I10 Essential (primary) hypertension 03/25/2019 No Yes I25.10 Atherosclerotic heart disease of native coronary artery 03/25/2019 No Yes without angina pectoris N18.3 Chronic kidney disease, stage 3 (moderate) 03/25/2019 No Yes Inactive Problems Resolved Problems Electronic Signature(s) Signed: 04/12/2019 10:27:00 AM By: Linton Ham MD Entered By: Linton Ham on 04/12/2019 06:48:37 -------------------------------------------------------------------------------- Progress Note Details Patient Name: Date of Service: LEMOYNE, NESTOR 04/10/2019 4:00 PM Medical Record [id number] Patient Account Number: [id number] Date of Birth/Sex: Treating RN: 1950/11/02 (68 y.o. Male) Levan Hurst Primary Care Provider: CLINIC, MEDICAL Other Clinician: Referring Provider: Treating Provider/Extender:Jadalyn Oliveri, Juliene Pina, Nat Math in Treatment: 2 Subjective History of Present Illness (HPI) 03/25/2019 patient presents today for initial evaluation in our clinic concerning wounds on the bilateral lower extremities. He has a significant medical history which is positive for chronic venous insufficiency, lymphedema, diabetes mellitus type 2, congestive heart failure, hypertension, atherosclerotic heart disease, and stage III chronic kidney disease. His issue at this point is that he has had for the  past 2 months problems with edema and swelling of the bilateral lower extremities and apparently this is after he had issues with his heart. His ejection fraction is 45 to 50% based on his last test. He was in the hospital with regard to this and during the time in the hospital was given Silvadene cream by the wound care ostomy nurse to be applied to the legs. No wrap was recommended according to what the patient and his wife tell me at this time. With that being said I do think that the patient likely needs bilateral compression wraps to try  to help get some of the fluid out of legs and help the wounds to heal. I do not see any signs of active infection which is good news. No fevers, chills, nausea, vomiting, or diarrhea.Patient is not having severe pain although he does have some discomfort on the bilateral lower extremities with cleaning over the area. 04/03/2019; patient returns to clinic today. He looks a lot better per our nurses. Most of the areas on the left leg are healed his edema control is excellent. Still an open area on the lower left medial leg. On the right the wounds and the swelling are a little worse than the left however everything looks improved here as well. He was able to tolerate 3 layer compression. He apparently has juxta lite stockings I asked him to bring these in next week. The area on the left leg may be healed. He tells me that his cardiologist increased his torsemide to 20 twice daily. He has a history of congestive heart failure 10/9; his wounds are healed on the bilateral lower extremities he has bilateral juxta lites Objective Constitutional Sitting or standing Blood Pressure is within target range for patient.. Pulse regular and within target range for patient.Marland Kitchen. Respirations regular, non-labored and within target range.. Temperature is normal and within the target range for the patient.Marland Kitchen. Appears in no distress. Vitals Time Taken: 4:28 PM, Height: 67 in, Weight:  222 lbs, BMI: 34.8, Temperature: 97.5 F, Pulse: 70 bpm, Respiratory Rate: 18 breaths/min, Blood Pressure: 133/79 mmHg. Eyes Conjunctivae clear. No discharge.no icterus. Respiratory work of breathing is normal. Cardiovascular Pedal pulses palpable and strong bilaterally.Marland Kitchen. edema control is excellent. Psychiatric appears at normal baseline. General Notes: exam; his edema is under excellent control. There is no sign of active infection. Wounds on his bilateral lower extremities are healed and epithelialized. no evidence of active infection Integumentary (Hair, Skin) changes of chronic venous insufficiency no other skin lesions are seen. Wound #1 status is Open. Original cause of wound was Gradually Appeared. The wound is located on the Right,Circumferential Lower Leg. The wound measures 0cm length x 0cm width x 0cm depth; 0cm^2 area and 0cm^3 volume. There is no tunneling or undermining noted. There is a none present amount of drainage noted. The wound margin is flat and intact. There is no granulation within the wound bed. There is no necrotic tissue within the wound bed. Wound #2 status is Open. Original cause of wound was Gradually Appeared. The wound is located on the Left,Circumferential Lower Leg. The wound measures 0cm length x 0cm width x 0cm depth; 0cm^2 area and 0cm^3 volume. There is no tunneling or undermining noted. There is a none present amount of drainage noted. The wound margin is flat and intact. There is no granulation within the wound bed. There is no necrotic tissue within the wound bed. Wound #3 status is Open. Original cause of wound was Gradually Appeared. The wound is located on the Right,Dorsal Foot. The wound measures 0cm length x 0cm width x 0cm depth; 0cm^2 area and 0cm^3 volume. There is no tunneling or undermining noted. There is a none present amount of drainage noted. The wound margin is flat and intact. There is no granulation within the wound bed. There is  no necrotic tissue within the wound bed. 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 diastolic (congestive) heart failure Essential (primary) hypertension Atherosclerotic heart disease of native coronary artery without angina pectoris Chronic kidney disease, stage 3 (moderate) Plan Discharge From [facility] Services: Discharge from Wound Care Center Skin Barriers/Peri-Wound Care: Moisturizing lotion - both legs daily Edema Control: Avoid standing for long periods of time Elevate legs to the level of the heart or above for 30 minutes daily and/or when sitting, a frequency of: - throughout the day Exercise regularly Support Garment 20-30 mm/Hg pressure to: - Juxtalite to both legs daily #1the patient has healed over. 2 bilateral chucks to light stockings 3 skin lubrication emphasized daily at bedtime. #4 he can be discharged from Electronic Signature(s) Signed: 04/12/2019 10:27:00 AM By: Baltazar Najjar MD Entered By: Baltazar Najjar on 04/12/2019 06:53:35 -------------------------------------------------------------------------------- SuperBill Details Patient Name: Date of Service: ZIDANE, RENNER 04/10/2019 Medical Record [id number] Patient Account Number: [id number] Date of Birth/Sex: Treating RN: 08/07/1950 (68 y.o. Male) Zandra Abts Primary Care Provider: CLINIC, MEDICAL Other Clinician: Referring Provider: Treating Provider/Extender:Wing Gfeller, Duard Larsen, Sherlon Handing in Treatment: 2 Diagnosis  Coding ICD-10 Codes Code Description I87.2 Venous insufficiency (chronic) (peripheral) I89.0 Lymphedema, not elsewhere classified E11.621 Type 2 diabetes mellitus with foot ulcer L97.812 Non-pressure chronic ulcer of other part of right lower leg with fat layer exposed L97.822 Non-pressure chronic ulcer of other part of left lower leg with fat layer exposed L97.522 Non-pressure chronic ulcer of other part of left foot with fat layer exposed L97.512 Non-pressure chronic ulcer of other part of right foot with fat layer exposed I50.42 Chronic combined systolic (congestive) and diastolic (congestive) heart failure I10 Essential (primary) hypertension I25.10 Atherosclerotic heart disease of native coronary artery without angina pectoris N18.3 Chronic kidney disease, stage 3 (moderate) Facility Procedures CPT4 Code: [id number] Description: 99213 - WOUND CARE VISIT-LEV 3 EST PT Modifier: Quantity: 1 Physician Procedures CPT4 Code Description: [id number] 99213 - WC PHYS LEVEL 3 - EST PT ICD-10 Diagnosis Description L97.812 Non-pressure chronic ulcer of other part of right lower L97.822 Non-pressure chronic ulcer of other part of left lower l I87.2 Venous insufficiency  (chronic) (peripheral) I89.0 Lymphedema, not elsewhere classified Modifier: leg with fat la eg with fat lay Quantity: 1 yer exposed er exposed Electronic Signature(s) Signed: 04/12/2019 10:27:00 AM By: Baltazar Najjar MD Entered By: Baltazar Najjar on 04/12/2019 06:55:27

## 2019-04-14 NOTE — Telephone Encounter (Signed)
error 

## 2019-04-14 NOTE — Telephone Encounter (Signed)
Pt made aware new orders for more  dedicated ultrasound.

## 2019-04-14 NOTE — Telephone Encounter (Signed)
Pt ordered for PAD's per MD

## 2019-04-20 ENCOUNTER — Other Ambulatory Visit (HOSPITAL_COMMUNITY): Payer: Self-pay | Admitting: Cardiology

## 2019-04-20 ENCOUNTER — Other Ambulatory Visit (HOSPITAL_COMMUNITY)
Admission: RE | Admit: 2019-04-20 | Discharge: 2019-04-20 | Disposition: A | Discharge status: Home / Self Care | Payer: Medicare Other | Source: Ambulatory Visit | Attending: Cardiology | Admitting: Cardiology

## 2019-04-20 DIAGNOSIS — Z20828 Contact with and (suspected) exposure to other viral communicable diseases: Secondary | ICD-10-CM | POA: Insufficient documentation

## 2019-04-20 DIAGNOSIS — Z01812 Encounter for preprocedural laboratory examination: Secondary | ICD-10-CM | POA: Diagnosis present

## 2019-04-20 DIAGNOSIS — I739 Peripheral vascular disease, unspecified: Secondary | ICD-10-CM

## 2019-04-21 LAB — NOVEL CORONAVIRUS, NAA (HOSP ORDER, SEND-OUT TO REF LAB; TAT 18-24 HRS): SARS-CoV-2, NAA: NOT DETECTED

## 2019-04-23 ENCOUNTER — Ambulatory Visit (INDEPENDENT_AMBULATORY_CARE_PROVIDER_SITE_OTHER): Payer: Medicare Other | Admitting: Internal Medicine

## 2019-04-23 ENCOUNTER — Other Ambulatory Visit: Payer: Self-pay

## 2019-04-23 DIAGNOSIS — J181 Lobar pneumonia, unspecified organism: Secondary | ICD-10-CM | POA: Diagnosis not present

## 2019-04-23 LAB — PULMONARY FUNCTION TEST
DL/VA % pred: 138 %
DL/VA: 5.69 ml/min/mmHg/L
DLCO cor % pred: 105 %
DLCO cor: 25.13 ml/min/mmHg
DLCO unc % pred: 92 %
DLCO unc: 21.85 ml/min/mmHg
FEF 25-75 Post: 1.81 L/sec
FEF 25-75 Pre: 1.99 L/sec
FEF2575-%Change-Post: -8 %
FEF2575-%Pred-Post: 80 %
FEF2575-%Pred-Pre: 88 %
FEV1-%Change-Post: -3 %
FEV1-%Pred-Post: 59 %
FEV1-%Pred-Pre: 62 %
FEV1-Post: 1.74 L
FEV1-Pre: 1.82 L
FEV1FVC-%Change-Post: 7 %
FEV1FVC-%Pred-Pre: 112 %
FEV6-%Change-Post: -10 %
FEV6-%Pred-Post: 52 %
FEV6-%Pred-Pre: 58 %
FEV6-Post: 1.94 L
FEV6-Pre: 2.17 L
FEV6FVC-%Change-Post: 0 %
FEV6FVC-%Pred-Post: 105 %
FEV6FVC-%Pred-Pre: 105 %
FVC-%Change-Post: -10 %
FVC-%Pred-Post: 49 %
FVC-%Pred-Pre: 55 %
FVC-Post: 1.96 L
FVC-Pre: 2.19 L
Post FEV1/FVC ratio: 89 %
Post FEV6/FVC ratio: 99 %
Pre FEV1/FVC ratio: 83 %
Pre FEV6/FVC Ratio: 99 %
RV % pred: 132 %
RV: 2.99 L
TLC % pred: 87 %
TLC: 5.59 L

## 2019-04-23 NOTE — Progress Notes (Signed)
Full PFT performed today. °

## 2019-04-27 ENCOUNTER — Ambulatory Visit (HOSPITAL_COMMUNITY)
Admission: RE | Admit: 2019-04-27 | Discharge: 2019-04-27 | Disposition: A | Discharge status: Home / Self Care | Payer: Medicare Other | Source: Ambulatory Visit | Attending: Cardiology | Admitting: Cardiology

## 2019-04-27 ENCOUNTER — Other Ambulatory Visit: Payer: Self-pay

## 2019-04-27 DIAGNOSIS — I739 Peripheral vascular disease, unspecified: Secondary | ICD-10-CM | POA: Diagnosis not present

## 2019-04-28 ENCOUNTER — Ambulatory Visit (HOSPITAL_COMMUNITY)
Admission: RE | Admit: 2019-04-28 | Discharge: 2019-04-28 | Disposition: A | Discharge status: Home / Self Care | Payer: Medicare Other | Source: Ambulatory Visit | Attending: Cardiology | Admitting: Cardiology

## 2019-04-28 ENCOUNTER — Encounter (HOSPITAL_COMMUNITY): Payer: Self-pay | Admitting: Cardiology

## 2019-04-28 VITALS — BP 110/80 | HR 74 | Wt 214.8 lb

## 2019-04-28 DIAGNOSIS — E1122 Type 2 diabetes mellitus with diabetic chronic kidney disease: Secondary | ICD-10-CM | POA: Diagnosis not present

## 2019-04-28 DIAGNOSIS — I2510 Atherosclerotic heart disease of native coronary artery without angina pectoris: Secondary | ICD-10-CM | POA: Diagnosis not present

## 2019-04-28 DIAGNOSIS — I447 Left bundle-branch block, unspecified: Secondary | ICD-10-CM | POA: Diagnosis not present

## 2019-04-28 DIAGNOSIS — Z8674 Personal history of sudden cardiac arrest: Secondary | ICD-10-CM | POA: Diagnosis not present

## 2019-04-28 DIAGNOSIS — Z7982 Long term (current) use of aspirin: Secondary | ICD-10-CM | POA: Diagnosis not present

## 2019-04-28 DIAGNOSIS — N1831 Chronic kidney disease, stage 3a: Secondary | ICD-10-CM

## 2019-04-28 DIAGNOSIS — I130 Hypertensive heart and chronic kidney disease with heart failure and stage 1 through stage 4 chronic kidney disease, or unspecified chronic kidney disease: Secondary | ICD-10-CM | POA: Insufficient documentation

## 2019-04-28 DIAGNOSIS — I712 Thoracic aortic aneurysm, without rupture: Secondary | ICD-10-CM | POA: Insufficient documentation

## 2019-04-28 DIAGNOSIS — Z79899 Other long term (current) drug therapy: Secondary | ICD-10-CM | POA: Insufficient documentation

## 2019-04-28 DIAGNOSIS — I5022 Chronic systolic (congestive) heart failure: Secondary | ICD-10-CM

## 2019-04-28 DIAGNOSIS — N183 Chronic kidney disease, stage 3 unspecified: Secondary | ICD-10-CM | POA: Diagnosis not present

## 2019-04-28 DIAGNOSIS — E11621 Type 2 diabetes mellitus with foot ulcer: Secondary | ICD-10-CM | POA: Diagnosis not present

## 2019-04-28 DIAGNOSIS — I5032 Chronic diastolic (congestive) heart failure: Secondary | ICD-10-CM | POA: Insufficient documentation

## 2019-04-28 DIAGNOSIS — Z8673 Personal history of transient ischemic attack (TIA), and cerebral infarction without residual deficits: Secondary | ICD-10-CM | POA: Insufficient documentation

## 2019-04-28 DIAGNOSIS — Z8249 Family history of ischemic heart disease and other diseases of the circulatory system: Secondary | ICD-10-CM | POA: Diagnosis not present

## 2019-04-28 DIAGNOSIS — E785 Hyperlipidemia, unspecified: Secondary | ICD-10-CM | POA: Insufficient documentation

## 2019-04-28 DIAGNOSIS — Z7984 Long term (current) use of oral hypoglycemic drugs: Secondary | ICD-10-CM | POA: Insufficient documentation

## 2019-04-28 LAB — BASIC METABOLIC PANEL
Anion gap: 12 (ref 5–15)
BUN: 31 mg/dL — ABNORMAL HIGH (ref 8–23)
CO2: 28 mmol/L (ref 22–32)
Calcium: 8.6 mg/dL — ABNORMAL LOW (ref 8.9–10.3)
Chloride: 97 mmol/L — ABNORMAL LOW (ref 98–111)
Creatinine, Ser: 1.49 mg/dL — ABNORMAL HIGH (ref 0.61–1.24)
GFR calc Af Amer: 55 mL/min — ABNORMAL LOW (ref >60)
GFR calc non Af Amer: 47 mL/min — ABNORMAL LOW (ref >60)
Glucose, Bld: 259 mg/dL — ABNORMAL HIGH (ref 70–99)
Potassium: 3.9 mmol/L (ref 3.5–5.1)
Sodium: 137 mmol/L (ref 135–145)

## 2019-04-28 MED ORDER — TORSEMIDE 20 MG PO TABS: ORAL_TABLET | ORAL | 6 refills | Status: DC

## 2019-04-28 NOTE — Patient Instructions (Signed)
INCREASE Torsemide to 40 mg in the AM and 20 mg in the PM CHANGE Coreg to 9.375 mg, one and one half tab twice daily  You have been referred to [facility] To further manage your heart failure, they will be in contact with you.  Labs today We will only contact you if something comes back abnormal or we need to make some changes. Otherwise no news is good news!  Your physician recommends that you schedule a follow-up appointment in: 4 weeks  in the Advanced Practitioners (PA/NP) Clinic    Do the following things EVERYDAY: 1) Weigh yourself in the morning before breakfast. Write it down and keep it in a log. 2) Take your medicines as prescribed 3) Eat low salt foods-Limit salt (sodium) to 2000 mg per day.  4) Stay as active as you can everyday 5) Limit all fluids for the day to less than 2 liters  At the [facility], you and your health needs are our priority. As part of our continuing mission to provide you with exceptional heart care, we have created designated Provider Care Teams. These Care Teams include your primary Cardiologist (physician) and Advanced Practice Providers (APPs- Physician Assistants and Nurse Practitioners) who all work together to provide you with the care you need, when you need it.   You may see any of the following providers on your designated Care Team at your next follow up: Marland Kitchen Dr Glori Bickers . Dr Loralie Champagne . Darrick Grinder, NP . Lyda Jester, PA   Please be sure to bring in all your medications bottles to every appointment.

## 2019-04-28 NOTE — Progress Notes (Signed)
SATURATION QUALIFICATIONS: (This note is used to comply with regulatory documentation for home oxygen)  Patient Saturations on Room Air at Rest = 96%  Patient Saturations on Room Air while Ambulating = 82%  Patient Saturations on 2 Liters of oxygen while Ambulating = 96%  Please briefly explain why patient needs home oxygen: Pt ambulated for 6 minutes and oxygen desaturated to 82%.  Pt needs oxygen for activity while at home. 

## 2019-04-29 ENCOUNTER — Telehealth (HOSPITAL_COMMUNITY): Payer: Self-pay

## 2019-04-29 NOTE — Progress Notes (Signed)
[facility] Cardiology: Dr. Aundra Dubin  68 y.o. with history of CHF, [name], [name] for followup of CHF.  He has a history of mild LV systolic dysfunction (EF 42-87% range) with probably significant RV failure.  Patient was admitted in 4/20 with hypoxic respiratory failure due to PNA and decompensated HF.  Hospitalization was complicated by PEA arrest and AKI.   In 6/20, he was again admitted with hypoxemic respiratory failure with hypertensive crisis, CHF, and PNA.    His legs are doing better, no longer going to wound clinic. Weight is down 6 lbs.  He is only taking torsemide 20 mg bid currently.  No dyspnea walking around the house, but he still has bendopnea and orthopnea.  Had been sleeping in recliner, now sleeping in bed but propped up.  He has fatigue and daytime sleepiness, snores at night.   Oxygen saturation 82% with ambulation.   Labs (4/20): LDL 91 Labs (9/20): K 4, creatinine 1.95 => 2.1 Labs (10/20): K 4.3, creatinine 1.8  PMH: 1. HTN 2. [name] stage 3 3. Type 2 diabetes 4. Hyperlipidemia 5. COPD: Suspected 6. PNA: Recurrent, suspected aspiration.  7. PEA arrest 4/20: Respiratory arrest.  8. LBBB 9. TIA 10. Ascending aortic aneurysm: CT chest in 6/20 with 5.1 cm ascending aortic aneurysm. 11. Chronic primarily diastolic CHF:  - LHC (6/81): Nonobstructive CAD.  - Echo (12/19): EF 45-50%.  - Echo (4/20): EF 45-50%, diffuse hypokinesis, normal RV.  12. Ventricular ectopy: 8/20 monitor showed PVCs and NSVT.  13. Peripheral arterial dopplers (10/20): No significant stenosis.    Social History   Socioeconomic History  . Marital status: Married    Spouse name: Not on file  . Number of children: Not on file  . Years of education: Not on file  . Highest education level: Not on file  Occupational History  . Not on file  Social Needs  . Financial resource strain: Not very hard  . Food insecurity    Worry: Never true    Inability: Never true  . Transportation needs    Medical: Patient refused    Non-medical: Patient refused  Tobacco Use  . Smoking status: Never Smoker  . Smokeless tobacco: Never Used  Substance and Sexual Activity  . Alcohol use: No  . Drug use: No  . Sexual activity: Not Currently    Partners: Female    Birth control/protection: None  Lifestyle  . Physical activity    Days per week: 0 days    Minutes per session: 0 min  . Stress: Not at all  Relationships  . Social Herbalist on phone: Patient refused    Gets together: Patient refused    Attends religious service: Patient refused    Active member of club or organization: Patient refused    Attends meetings of clubs or organizations: Patient refused    Relationship status: Patient refused  . Intimate partner violence    Fear of current or ex partner: No    Emotionally abused: No    Physically abused: No    Forced sexual activity: No  Other Topics Concern  . Not on file  Social History Narrative   Patient lives in private residence with supportive spouse   Family History  Problem Relation Age of Onset  . Emphysema Mother   . Aneurysm Mother   . Emphysema Father   . Coronary artery disease Brother   . Coronary artery disease Brother   . Coronary artery disease Sister  ROS: All systems reviewed and negative except as per HPI.   Current Outpatient Medications  Medication Sig Dispense Refill  . Accu-Chek FastClix Lancets MISC USE UTD    . ACCU-CHEK GUIDE test strip U TO TEST BLOOD SUGAR QD    . albuterol (PROVENTIL HFA;VENTOLIN HFA) 108 (90 Base) MCG/ACT inhaler Inhale 2 puffs into the lungs every 6 (six) hours as needed for wheezing or shortness of breath. 1 Inhaler 2  . aspirin EC 81 MG tablet Take 81 mg by mouth daily.    . atorvastatin (LIPITOR) 40 MG tablet Take 1 tablet (40 mg total) by mouth every evening. 30 tablet 0  . Blood Glucose Monitoring Suppl (ACCU-CHEK GUIDE) w/Device KIT U TO TEST BLOOD SUGAR QD    . budesonide-formoterol (SYMBICORT)  80-4.5 MCG/ACT inhaler     . carvedilol (COREG) 6.25 MG tablet Take 1.5 tablets (9.375 mg total) by mouth 2 (two) times daily. 180 tablet 3  . finasteride (PROSCAR) 5 MG tablet Take 5 mg by mouth daily.    . gabapentin (NEURONTIN) 300 MG capsule Take 300 mg by mouth 4 (four) times daily.     . hydrOXYzine (VISTARIL) 50 MG capsule Take 50 mg by mouth 3 (three) times daily.    . metFORMIN (GLUCOPHAGE) 1000 MG tablet Take 1 tablet by mouth 2 (two) times a day.    . montelukast (SINGULAIR) 10 MG tablet Take 10 mg by mouth at bedtime.    . Multiple Vitamin (MULTIVITAMIN) tablet Take 1 tablet by mouth daily.    . pantoprazole (PROTONIX) 40 MG tablet Take 1 tablet (40 mg total) by mouth daily. 30 tablet 1  . potassium chloride SA (KLOR-CON) 20 MEQ tablet Take 20 mEq by mouth 2 (two) times daily.    . tamsulosin (FLOMAX) 0.4 MG CAPS capsule Take 0.4 mg by mouth daily.    . torsemide (DEMADEX) 20 MG tablet Take 2 tablets (40 mg total) by mouth every morning AND 1 tablet (20 mg total) every evening. 90 tablet 6   No current facility-administered medications for this encounter.    BP 110/80   Pulse 74   Wt 97.4 kg (214 lb 12.8 oz)   SpO2 95%   BMI 33.64 kg/m  General: NAD Neck: JVP 8-9 cm with HJR, no thyromegaly or thyroid nodule.  Lungs: Crackles at bases bilaterally. CV: Nondisplaced PMI.  Heart regular S1/S2, no S3/S4, no murmur.  1+ edema to knees bilaterally (improved).  No carotid bruit.  Unable to palpate pedal pulses.  Abdomen: Soft, nontender, no hepatosplenomegaly, no distention.  Skin: Intact without lesions or rashes.  Neurologic: Alert and oriented x 3.  Psych: Normal affect. Extremities: No clubbing or cyanosis.  HEENT: Normal. '  Assessment/Plan: 1. Chronic primarily diastolic CHF: I suspect that there is significant RV failure.  Last echo in 4/20 with EF 45-50%.  Cath in 9/12 showed nonobstructive coronary disease.  Weight is down 6 lbs on torsemide.  Still volume overloaded on  exam, oxygen saturation drops to 82% with exertion.   - He needs home oxygen with exertion, will arrange.  - Increase torsemide to 40 qam/20 qpm, BMET today and again in 10 days.  - He has been approved for Cardiomems, will set up procedure.  I discussed risks/benefits with him and he agrees.  - I will set him up with paramedicine.  2. [name]: Stage 3.  Will have to follow creatinine closely with increase in torsemide.  3. OSA: Strongly suspect OSA with fatigue, snoring, and   daytime sleepines. Arrange for home sleep study.  4. Leg/foot ulcers: Peripheral arterial dopplers did not show significant PAD, suspect venous insufficiency.  5. Recurrent PNA: ?Aspiration.  6. LBBB: Chronic.  7. Ascending aortic aneurysm: CT chest 6/20 with 5.1 cm ascending aortic aneurysm.  He currently would be a poor surgical candidate.   Followup 4 wks with NP  Amaiya Scruton 04/29/2019                                                                                                

## 2019-04-29 NOTE — H&P (View-Only) (Signed)
[facility]iology: Dr. Aundra Dubin  68 y.o. with history of CHF, [name], [name] for followup of CHF.  He has a history of mild LV systolic dysfunction (EF 42-87% range) with probably significant RV failure.  Patient was admitted in 4/20 with hypoxic respiratory failure due to PNA and decompensated HF.  Hospitalization was complicated by PEA arrest and AKI.   In 6/20, he was again admitted with hypoxemic respiratory failure with hypertensive crisis, CHF, and PNA.    His legs are doing better, no longer going to wound clinic. Weight is down 6 lbs.  He is only taking torsemide 20 mg bid currently.  No dyspnea walking around the house, but he still has bendopnea and orthopnea.  Had been sleeping in recliner, now sleeping in bed but propped up.  He has fatigue and daytime sleepiness, snores at night.   Oxygen saturation 82% with ambulation.   Labs (4/20): LDL 91 Labs (9/20): K 4, creatinine 1.95 => 2.1 Labs (10/20): K 4.3, creatinine 1.8  PMH: 1. HTN 2. [name] stage 3 3. Type 2 diabetes 4. Hyperlipidemia 5. COPD: Suspected 6. PNA: Recurrent, suspected aspiration.  7. PEA arrest 4/20: Respiratory arrest.  8. LBBB 9. TIA 10. Ascending aortic aneurysm: CT chest in 6/20 with 5.1 cm ascending aortic aneurysm. 11. Chronic primarily diastolic CHF:  - LHC (6/81): Nonobstructive CAD.  - Echo (12/19): EF 45-50%.  - Echo (4/20): EF 45-50%, diffuse hypokinesis, normal RV.  12. Ventricular ectopy: 8/20 monitor showed PVCs and NSVT.  13. Peripheral arterial dopplers (10/20): No significant stenosis.    Social History   Socioeconomic History  . Marital status: Married    Spouse name: Not on file  . Number of children: Not on file  . Years of education: Not on file  . Highest education level: Not on file  Occupational History  . Not on file  Social Needs  . Financial resource strain: Not very hard  . Food insecurity    Worry: Never true    Inability: Never true  . Transportation needs    Medical: Patient refused    Non-medical: Patient refused  Tobacco Use  . Smoking status: Never Smoker  . Smokeless tobacco: Never Used  Substance and Sexual Activity  . Alcohol use: No  . Drug use: No  . Sexual activity: Not Currently    Partners: Female    Birth control/protection: None  Lifestyle  . Physical activity    Days per week: 0 days    Minutes per session: 0 min  . Stress: Not at all  Relationships  . Social Herbalist on phone: Patient refused    Gets together: Patient refused    Attends religious service: Patient refused    Active member of club or organization: Patient refused    Attends meetings of clubs or organizations: Patient refused    Relationship status: Patient refused  . Intimate partner violence    Fear of current or ex partner: No    Emotionally abused: No    Physically abused: No    Forced sexual activity: No  Other Topics Concern  . Not on file  Social History Narrative   Patient lives in private residence with supportive spouse   Family History  Problem Relation Age of Onset  . Emphysema Mother   . Aneurysm Mother   . Emphysema Father   . Coronary artery disease Brother   . Coronary artery disease Brother   . Coronary artery disease Sister  ROS: All systems reviewed and negative except as per HPI.   Current Outpatient Medications  Medication Sig Dispense Refill  . Accu-Chek FastClix Lancets MISC USE UTD    . ACCU-CHEK GUIDE test strip U TO TEST BLOOD SUGAR QD    . albuterol (PROVENTIL HFA;VENTOLIN HFA) 108 (90 Base) MCG/ACT inhaler Inhale 2 puffs into the lungs every 6 (six) hours as needed for wheezing or shortness of breath. 1 Inhaler 2  . aspirin EC 81 MG tablet Take 81 mg by mouth daily.    Marland Kitchen atorvastatin (LIPITOR) 40 MG tablet Take 1 tablet (40 mg total) by mouth every evening. 30 tablet 0  . Blood Glucose Monitoring Suppl (ACCU-CHEK GUIDE) w/Device KIT U TO TEST BLOOD SUGAR QD    . budesonide-formoterol (SYMBICORT)  80-4.5 MCG/ACT inhaler     . carvedilol (COREG) 6.25 MG tablet Take 1.5 tablets (9.375 mg total) by mouth 2 (two) times daily. 180 tablet 3  . finasteride (PROSCAR) 5 MG tablet Take 5 mg by mouth daily.    Marland Kitchen gabapentin (NEURONTIN) 300 MG capsule Take 300 mg by mouth 4 (four) times daily.     . hydrOXYzine (VISTARIL) 50 MG capsule Take 50 mg by mouth 3 (three) times daily.    . metFORMIN (GLUCOPHAGE) 1000 MG tablet Take 1 tablet by mouth 2 (two) times a day.    . montelukast (SINGULAIR) 10 MG tablet Take 10 mg by mouth at bedtime.    . Multiple Vitamin (MULTIVITAMIN) tablet Take 1 tablet by mouth daily.    . pantoprazole (PROTONIX) 40 MG tablet Take 1 tablet (40 mg total) by mouth daily. 30 tablet 1  . potassium chloride SA (KLOR-CON) 20 MEQ tablet Take 20 mEq by mouth 2 (two) times daily.    . tamsulosin (FLOMAX) 0.4 MG CAPS capsule Take 0.4 mg by mouth daily.    Marland Kitchen torsemide (DEMADEX) 20 MG tablet Take 2 tablets (40 mg total) by mouth every morning AND 1 tablet (20 mg total) every evening. 90 tablet 6   No current facility-administered medications for this encounter.    BP 110/80   Pulse 74   Wt 97.4 kg (214 lb 12.8 oz)   SpO2 95%   BMI 33.64 kg/m  General: NAD Neck: JVP 8-9 cm with HJR, no thyromegaly or thyroid nodule.  Lungs: Crackles at bases bilaterally. CV: Nondisplaced PMI.  Heart regular S1/S2, no S3/S4, no murmur.  1+ edema to knees bilaterally (improved).  No carotid bruit.  Unable to palpate pedal pulses.  Abdomen: Soft, nontender, no hepatosplenomegaly, no distention.  Skin: Intact without lesions or rashes.  Neurologic: Alert and oriented x 3.  Psych: Normal affect. Extremities: No clubbing or cyanosis.  HEENT: Normal. '  Assessment/Plan: 1. Chronic primarily diastolic CHF: I suspect that there is significant RV failure.  Last echo in 4/20 with EF 45-50%.  Cath in 9/12 showed nonobstructive coronary disease.  Weight is down 6 lbs on torsemide.  Still volume overloaded on  exam, oxygen saturation drops to 82% with exertion.   - He needs home oxygen with exertion, will arrange.  - Increase torsemide to 40 qam/20 qpm, BMET today and again in 10 days.  - He has been approved for Cardiomems, will set up procedure.  I discussed risks/benefits with him and he agrees.  - I will set him up with paramedicine.  2. [name]: Stage 3.  Will have to follow creatinine closely with increase in torsemide.  3. OSA: Strongly suspect OSA with fatigue, snoring, and  daytime sleepines. Arrange for home sleep study.  4. Leg/foot ulcers: Peripheral arterial dopplers did not show significant PAD, suspect venous insufficiency.  5. Recurrent PNA: ?Aspiration.  6. LBBB: Chronic.  7. Ascending aortic aneurysm: CT chest 6/20 with 5.1 cm ascending aortic aneurysm.  He currently would be a poor surgical candidate.   Followup 4 wks with NP  Loralie Champagne 04/29/2019

## 2019-04-29 NOTE — Telephone Encounter (Signed)
Sent community message for patient to have home O2 however DME reps state that patient received home O2 back in April. Called patient to confirm. LM for patient to call office.

## 2019-05-04 NOTE — Telephone Encounter (Signed)
Left message for patient and wife to see if patient has O2 at home.

## 2019-05-05 ENCOUNTER — Telehealth (HOSPITAL_COMMUNITY): Payer: Self-pay

## 2019-05-05 NOTE — Telephone Encounter (Signed)
I called Mr. Matlack to schedule a CHP visit.

## 2019-05-07 ENCOUNTER — Telehealth (HOSPITAL_COMMUNITY): Payer: Self-pay

## 2019-05-07 ENCOUNTER — Telehealth (HOSPITAL_COMMUNITY): Payer: Self-pay | Admitting: *Deleted

## 2019-05-07 NOTE — Telephone Encounter (Signed)
I spoke to Mr. [name] and his wife and they agree to [facility] visit next Wednesday morning @ 9 am.  He has a scale that he's weighing with daily.  He said that he gained 4 lbs overnight; he went from 212 to 216.  His wife said that he ate potatoe chips and other foods during the election party they had the day before.  He said that he's wheezing "a little bit", it's unknown what his baseline respiratory condition is.  His wife also want to know the results of his lab work.    I called and spoke with Jasmine at [facility] about pt's complaint.  She said that Amy advises pt to increase torsemide 40 BID for two days and keep potassium the same, he has a lab f/u already schedule for 11/23.

## 2019-05-07 NOTE — Telephone Encounter (Signed)
Dee w/paramedicine called to report pts 4lb weight gain overnight. Pt is asymptomatic. Per Amy Clegg,NP increase torsemide to 40mg  twice daily for 2 days. Pt has labs scheduled for 11/23. Dee aware and will notify patient.

## 2019-05-11 ENCOUNTER — Other Ambulatory Visit: Payer: Self-pay

## 2019-05-11 ENCOUNTER — Ambulatory Visit: Payer: Medicare Other | Admitting: Endocrinology

## 2019-05-13 ENCOUNTER — Telehealth (HOSPITAL_COMMUNITY): Payer: Self-pay

## 2019-05-13 NOTE — Telephone Encounter (Signed)
I left a message for pt to call me back, CHP visit scheduled for today.

## 2019-05-14 ENCOUNTER — Other Ambulatory Visit (HOSPITAL_COMMUNITY): Payer: Self-pay

## 2019-05-14 NOTE — Progress Notes (Signed)
Paramedicine Encounter    Patient ID: [name], male    DOB: 1950/09/24, 68 y.o.   MRN: [id number]    Patient Care Team: Clinic, Medical, MD (Inactive) as PCP - Cyndia Diver, MD as PCP - Cardiology (Cardiology)  Patient Active Problem List   Diagnosis Date Noted  . Seasonal allergies 02/12/2019  . Health care maintenance 02/12/2019  . GERD (gastroesophageal reflux disease) 02/12/2019  . Nocturnal hypoxemia 02/12/2019  . OSA (obstructive sleep apnea) 02/12/2019  . Pain due to onychomycosis of toenails of both feet 01/30/2019  . Lobar pneumonia ([place]) 12/29/2018  . Diabetes mellitus type 2, controlled, without complications ([place]) 42/87/6811  . Thoracic aortic aneurysm ([place]) 12/29/2018  . Acute on chronic combined systolic and diastolic CHF (congestive heart failure) ([place]) 12/25/2018  . Syncope 12/25/2018  . Chronic systolic heart failure ([place]) 11/19/2018  . Chronic venous insufficiency 06/24/2018  . Respiratory failure ([place]) 06/01/2018  . Leg swelling 05/06/2018  . Acute respiratory failure with hypoxemia ([place]) 10/14/2017  . Influenza-like illness 10/14/2017  . Delirium 10/14/2017  . Acute urinary retention 10/14/2017  . (HFpEF) heart failure with preserved ejection fraction ([place]), pulmonary edema and LBBB of unknown chronicity  10/29/2016  . Elevated troponin I level, presume demand ischemia  10/29/2016  . AKI (acute kidney injury) ([place]) 10/29/2016  . BPH (benign prostatic hyperplasia) 10/29/2016  . Sepsis ([place]) 10/29/2016  . Bacteremia due to Escherichia coli 10/29/2016  . Fatty liver 10/29/2016  . Acute pulmonary edema (HCC)   . Acute hypoxic and hypercarbic respiratory failure (HCC) in setting of bilateral pulmonary infiltrates. Presume CAP + pulmonary edema +/-ALI 10/27/2016  . Left-sided weakness 05/04/2015  . TIA (transient ischemic attack) : Rule out. 05/04/2015  . Hyperlipidemia 05/04/2015  . Chest pain 10/10/2013  . SOB (shortness of breath) 10/10/2013   . Hyperglycemia without ketosis 10/17/2011  . HTN (hypertension) 10/17/2011  . Foot drop, left 10/17/2011  . Impacted cerumen of right ear 10/17/2011  . Dyspnea 10/17/2011  . History of TIA (transient ischemic attack) 10/17/2011  . Neuropathy ([place]) 10/17/2011  . Aortic insufficiency 03/16/2011  . Chest pain 03/16/2011    Current Outpatient Medications:  .  aspirin EC 81 MG tablet, Take 81 mg by mouth daily., Disp: , Rfl:  .  atorvastatin (LIPITOR) 40 MG tablet, Take 1 tablet (40 mg total) by mouth every evening., Disp: 30 tablet, Rfl: 0 .  carvedilol (COREG) 6.25 MG tablet, Take 1.5 tablets (9.375 mg total) by mouth 2 (two) times daily., Disp: 180 tablet, Rfl: 3 .  finasteride (PROSCAR) 5 MG tablet, Take 5 mg by mouth daily., Disp: , Rfl:  .  gabapentin (NEURONTIN) 300 MG capsule, Take 300 mg by mouth 4 (four) times daily. , Disp: , Rfl:  .  hydrOXYzine (VISTARIL) 50 MG capsule, Take 50 mg by mouth 3 (three) times daily., Disp: , Rfl:  .  montelukast (SINGULAIR) 10 MG tablet, Take 10 mg by mouth at bedtime., Disp: , Rfl:  .  pantoprazole (PROTONIX) 40 MG tablet, Take 1 tablet (40 mg total) by mouth daily., Disp: 30 tablet, Rfl: 1 .  torsemide (DEMADEX) 20 MG tablet, Take 2 tablets (40 mg total) by mouth every morning AND 1 tablet (20 mg total) every evening., Disp: 90 tablet, Rfl: 6 .  Accu-Chek FastClix Lancets MISC, USE UTD, Disp: , Rfl:  .  ACCU-CHEK GUIDE test strip, U TO TEST BLOOD SUGAR QD, Disp: , Rfl:  .  albuterol (PROVENTIL HFA;VENTOLIN HFA) 108 (90 Base) MCG/ACT inhaler,  Inhale 2 puffs into the lungs every 6 (six) hours as needed for wheezing or shortness of breath., Disp: 1 Inhaler, Rfl: 2 .  Blood Glucose Monitoring Suppl (ACCU-CHEK GUIDE) w/Device KIT, U TO TEST BLOOD SUGAR QD, Disp: , Rfl:  .  budesonide-formoterol (SYMBICORT) 80-4.5 MCG/ACT inhaler, , Disp: , Rfl:  .  metFORMIN (GLUCOPHAGE) 1000 MG tablet, Take 1 tablet by mouth 2 (two) times a day., Disp: , Rfl:  .   Multiple Vitamin (MULTIVITAMIN) tablet, Take 1 tablet by mouth daily., Disp: , Rfl:  .  potassium chloride SA (KLOR-CON) 20 MEQ tablet, Take 20 mEq by mouth 2 (two) times daily., Disp: , Rfl:  .  tamsulosin (FLOMAX) 0.4 MG CAPS capsule, Take 0.4 mg by mouth daily., Disp: , Rfl:  Allergies  Allergen Reactions  . Cashew Nut Oil Palpitations  . Percocet [Oxycodone-Acetaminophen] Other (See Comments)    Hallucintaions     Social History   Socioeconomic History  . Marital status: Married    Spouse name: Not on file  . Number of children: Not on file  . Years of education: Not on file  . Highest education level: Not on file  Occupational History  . Not on file  Social Needs  . Financial resource strain: Not very hard  . Food insecurity    Worry: Never true    Inability: Never true  . Transportation needs    Medical: Patient refused    Non-medical: Patient refused  Tobacco Use  . Smoking status: Never Smoker  . Smokeless tobacco: Never Used  Substance and Sexual Activity  . Alcohol use: No  . Drug use: No  . Sexual activity: Not Currently    Partners: Female    Birth control/protection: None  Lifestyle  . Physical activity    Days per week: 0 days    Minutes per session: 0 min  . Stress: Not at all  Relationships  . Social Herbalist on phone: Patient refused    Gets together: Patient refused    Attends religious service: Patient refused    Active member of club or organization: Patient refused    Attends meetings of clubs or organizations: Patient refused    Relationship status: Patient refused  . Intimate partner violence    Fear of current or ex partner: No    Emotionally abused: No    Physically abused: No    Forced sexual activity: No  Other Topics Concern  . Not on file  Social History Narrative   Patient lives in private residence with supportive spouse    Physical Exam      Future Appointments  Date Time Provider [place]   05/25/2019 11:00 AM [facility] LAB [facility] None  05/25/2019 12:30 PM [facility]ENING [facility] None  06/03/2019 11:30 AM [facility] PA/NP [facility] None  06/05/2019  8:45 AM Gardiner Barefoot, DPM [facility]  06/22/2019  3:45 PM Kathlen Mody, Nicki Reaper T, PA-C [facility]    BP 118/80 (BP Location: Left Arm, Patient Position: Sitting, Cuff Size: Normal)   Pulse 76   Temp 98.1 F (36.7 C)   Wt 212 lb (96.2 kg)   SpO2 94%   BMI 33.20 kg/m  CBG 183  INITIAL VISIT  ATF pt CAO to his norm watching tv with his wife. Today is our initial visit.  We spoke last week and he reported a 4 lb weight gain overnight.  His wife said that they didn't take the extra torsemide because she think that he  misread the scale.  She said that the next morning he was back to normal.  We went over his medications; he understands how and when to take his medications. He agrees to try the pill box.  His wife said that she's been overwhelmed lately and she does need help with keeping his "medications under control".     He has a sleep study devise that they didn't know how to "work".  I was able to download the app to their grandson's phone and I should him how to put the devise on.  He and his wife agrees to [facility] visits.  They request Wednesday mornings.     Medication ordered: Kiana, EMT Paramedic [phone] 05/14/2019    ACTION: Home visit completed

## 2019-05-17 ENCOUNTER — Encounter (INDEPENDENT_AMBULATORY_CARE_PROVIDER_SITE_OTHER): Payer: Medicare Other | Admitting: Cardiology

## 2019-05-17 DIAGNOSIS — G4733 Obstructive sleep apnea (adult) (pediatric): Secondary | ICD-10-CM

## 2019-05-20 ENCOUNTER — Other Ambulatory Visit (HOSPITAL_COMMUNITY): Payer: Self-pay

## 2019-05-20 NOTE — Progress Notes (Signed)
Made in error

## 2019-05-24 NOTE — Procedures (Signed)
     Sleep Study Report  Patient Information Name: [name] ID: [id number] Birth Date: September 22, 1950  Age: 68  Gender: Male BMI: 34.6 (W=220 lb, H=5' 7'') Study Date:05/17/2019 Referring Physician:  Loralie Champagne, MD   TEST DESCRIPTION: Home sleep apnea testing was completed using the WatchPat, a Type 1 device, utilizing peripheral arterial tonometry (PAT), chest movement, actigraphy, pulse oximetry, pulse rate, body position and snore. AHI was calculated with apnea and hypopnea using valid sleep time as the denominator. RDI includes apneas, hypopneas, and RERAs. The data acquired and the scoring of sleep and all associated events were performed in accordance with the recommended standards and specifications as outlined in the AASM Manual for the Scoring of Sleep and Associated Events 2.2.0 (2015).  FINDINGS: 1. Severe Obstructive Sleep Apnea with AHI of 38.0/hr. 2. No significant Central Sleep Apena. pAHIc 1.1/hr. 3. Severe Oxygen desaturations as low as 69% with 54.5% time spent with O2 sats < 88%. 4. Mild to moderate snoring noted. 5. The average heart rate was 80bpm and ranged from 43 to 124bpm. 6. Time spent in the supine position was 99.7%. 7. Normal sleep onset latency at 15 minutes 8. Normal REM sleep onset latency at 81 minutes. 9. The patient experience 32 awakenings during sleep. 10. Total sleep time was 7 hours and 40 minutes.  DIAGNOSIS:  Severe Obstructive Sleep Apnea (G47.33)  Nocturnal Hypoxemia  RECOMMENDATIONS: 1. Consider continuous positive airway pressure (CPAP) as the initial treatment of choice for severe obstructive sleep apnea. If the patient chooses CPAP therapy, a nocturnal PSG with CPAP titration is recommended given severity of sleep disordered breathing and severe hypoxemia.  2. Positive airway pressure therapy (PAP) is the first line of treatment for patients with severe OSA. Alternative treatment for severe OSA in patients who cannot  tolerate and have failed or refused CPAP therapy includes :   a. The patient may benefit from the use of nocturnal mandibular repositioning appliance. If that line of therapy is to be pursed, the patient should be evaluated by a dentist trained in the treatment of sleep related breathing disorders.   b. An ENT consultation which may be useful to look for specific causes of obstruction and possible treatment Options.   c. If patient is intolerant to PAP therapy, consider referral to ENT for evaluation for hypoglossal nerve stimulator.  3. Weight loss may be of benefit in reducing the severity of respiratory events and snoring .  4. The patient should be counseled in good sleep hygiene and avoid sleeping in the supine position.  5. Routine follow-up efficacy testing should be performed.  Report prepared by: Signature: Fransico Him Electronically Signed:

## 2019-05-25 ENCOUNTER — Other Ambulatory Visit (HOSPITAL_COMMUNITY)
Admission: RE | Admit: 2019-05-25 | Discharge: 2019-05-25 | Disposition: A | Discharge status: Home / Self Care | Payer: Medicare Other | Source: Ambulatory Visit | Attending: Cardiology | Admitting: Cardiology

## 2019-05-25 ENCOUNTER — Other Ambulatory Visit: Payer: Self-pay

## 2019-05-25 ENCOUNTER — Other Ambulatory Visit (HOSPITAL_COMMUNITY): Payer: Self-pay

## 2019-05-25 ENCOUNTER — Ambulatory Visit (HOSPITAL_COMMUNITY)
Admission: RE | Admit: 2019-05-25 | Discharge: 2019-05-25 | Disposition: A | Discharge status: Home / Self Care | Payer: Medicare Other | Source: Ambulatory Visit | Attending: Cardiology | Admitting: Cardiology

## 2019-05-25 DIAGNOSIS — Z01812 Encounter for preprocedural laboratory examination: Secondary | ICD-10-CM | POA: Diagnosis not present

## 2019-05-25 DIAGNOSIS — I5022 Chronic systolic (congestive) heart failure: Secondary | ICD-10-CM | POA: Insufficient documentation

## 2019-05-25 DIAGNOSIS — Z20828 Contact with and (suspected) exposure to other viral communicable diseases: Secondary | ICD-10-CM | POA: Diagnosis not present

## 2019-05-25 LAB — BASIC METABOLIC PANEL
Anion gap: 11 (ref 5–15)
BUN: 29 mg/dL — ABNORMAL HIGH (ref 8–23)
CO2: 28 mmol/L (ref 22–32)
Calcium: 8 mg/dL — ABNORMAL LOW (ref 8.9–10.3)
Chloride: 98 mmol/L (ref 98–111)
Creatinine, Ser: 1.73 mg/dL — ABNORMAL HIGH (ref 0.61–1.24)
GFR calc Af Amer: 46 mL/min — ABNORMAL LOW (ref >60)
GFR calc non Af Amer: 39 mL/min — ABNORMAL LOW (ref >60)
Glucose, Bld: 147 mg/dL — ABNORMAL HIGH (ref 70–99)
Potassium: 4.3 mmol/L (ref 3.5–5.1)
Sodium: 137 mmol/L (ref 135–145)

## 2019-05-25 LAB — CBC
HCT: 38.6 % — ABNORMAL LOW (ref 39.0–52.0)
Hemoglobin: 11.8 g/dL — ABNORMAL LOW (ref 13.0–17.0)
MCH: 28.4 pg (ref 26.0–34.0)
MCHC: 30.6 g/dL (ref 30.0–36.0)
MCV: 92.8 fL (ref 80.0–100.0)
Platelets: 332 10*3/uL (ref 150–400)
RBC: 4.16 MIL/uL — ABNORMAL LOW (ref 4.22–5.81)
RDW: 13.9 % (ref 11.5–15.5)
WBC: 9.7 10*3/uL (ref 4.0–10.5)
nRBC: 0 % (ref 0.0–0.2)

## 2019-05-25 LAB — SARS CORONAVIRUS 2 (TAT 6-24 HRS): SARS Coronavirus 2: NEGATIVE

## 2019-05-25 LAB — PROTIME-INR
INR: 0.9 (ref 0.8–1.2)
Prothrombin Time: 12.3 seconds (ref 11.4–15.2)

## 2019-05-27 ENCOUNTER — Encounter (HOSPITAL_COMMUNITY)
Admission: RE | Disposition: A | Discharge status: Home / Self Care | Payer: Self-pay | Source: Home / Self Care | Attending: Cardiology

## 2019-05-27 ENCOUNTER — Other Ambulatory Visit: Payer: Self-pay

## 2019-05-27 ENCOUNTER — Telehealth: Payer: Self-pay | Admitting: *Deleted

## 2019-05-27 ENCOUNTER — Ambulatory Visit (HOSPITAL_COMMUNITY)
Admission: RE | Admit: 2019-05-27 | Discharge: 2019-05-27 | Disposition: A | Discharge status: Home / Self Care | Payer: Medicare Other | Attending: Cardiology | Admitting: Cardiology

## 2019-05-27 DIAGNOSIS — I712 Thoracic aortic aneurysm, without rupture: Secondary | ICD-10-CM | POA: Diagnosis not present

## 2019-05-27 DIAGNOSIS — I447 Left bundle-branch block, unspecified: Secondary | ICD-10-CM | POA: Insufficient documentation

## 2019-05-27 DIAGNOSIS — Z7982 Long term (current) use of aspirin: Secondary | ICD-10-CM | POA: Insufficient documentation

## 2019-05-27 DIAGNOSIS — Z8674 Personal history of sudden cardiac arrest: Secondary | ICD-10-CM | POA: Diagnosis not present

## 2019-05-27 DIAGNOSIS — E785 Hyperlipidemia, unspecified: Secondary | ICD-10-CM | POA: Diagnosis not present

## 2019-05-27 DIAGNOSIS — I2510 Atherosclerotic heart disease of native coronary artery without angina pectoris: Secondary | ICD-10-CM | POA: Insufficient documentation

## 2019-05-27 DIAGNOSIS — I509 Heart failure, unspecified: Secondary | ICD-10-CM

## 2019-05-27 DIAGNOSIS — N183 Chronic kidney disease, stage 3 unspecified: Secondary | ICD-10-CM | POA: Insufficient documentation

## 2019-05-27 DIAGNOSIS — Z79899 Other long term (current) drug therapy: Secondary | ICD-10-CM | POA: Insufficient documentation

## 2019-05-27 DIAGNOSIS — Z8673 Personal history of transient ischemic attack (TIA), and cerebral infarction without residual deficits: Secondary | ICD-10-CM | POA: Diagnosis not present

## 2019-05-27 DIAGNOSIS — E11621 Type 2 diabetes mellitus with foot ulcer: Secondary | ICD-10-CM | POA: Diagnosis not present

## 2019-05-27 DIAGNOSIS — I5032 Chronic diastolic (congestive) heart failure: Secondary | ICD-10-CM | POA: Insufficient documentation

## 2019-05-27 DIAGNOSIS — I130 Hypertensive heart and chronic kidney disease with heart failure and stage 1 through stage 4 chronic kidney disease, or unspecified chronic kidney disease: Secondary | ICD-10-CM | POA: Diagnosis not present

## 2019-05-27 DIAGNOSIS — Z7984 Long term (current) use of oral hypoglycemic drugs: Secondary | ICD-10-CM | POA: Diagnosis not present

## 2019-05-27 DIAGNOSIS — R0683 Snoring: Secondary | ICD-10-CM | POA: Diagnosis not present

## 2019-05-27 DIAGNOSIS — E1122 Type 2 diabetes mellitus with diabetic chronic kidney disease: Secondary | ICD-10-CM | POA: Insufficient documentation

## 2019-05-27 HISTORY — PX: PRESSURE SENSOR/CARDIOMEMS: CATH118258

## 2019-05-27 HISTORY — PX: RIGHT HEART CATH: CATH118263

## 2019-05-27 LAB — POCT I-STAT EG7
Acid-Base Excess: 4 mmol/L — ABNORMAL HIGH (ref 0.0–2.0)
Bicarbonate: 30.7 mmol/L — ABNORMAL HIGH (ref 20.0–28.0)
Calcium, Ion: 1.04 mmol/L — ABNORMAL LOW (ref 1.15–1.40)
HCT: 33 % — ABNORMAL LOW (ref 39.0–52.0)
Hemoglobin: 11.2 g/dL — ABNORMAL LOW (ref 13.0–17.0)
O2 Saturation: 70 %
Potassium: 4.1 mmol/L (ref 3.5–5.1)
Sodium: 140 mmol/L (ref 135–145)
TCO2: 32 mmol/L (ref 22–32)
pCO2, Ven: 52.5 mmHg (ref 44.0–60.0)
pH, Ven: 7.375 (ref 7.250–7.430)
pO2, Ven: 38 mmHg (ref 32.0–45.0)

## 2019-05-27 LAB — GLUCOSE, CAPILLARY
Glucose-Capillary: 161 mg/dL — ABNORMAL HIGH (ref 70–99)
Glucose-Capillary: 160 mg/dL — ABNORMAL HIGH (ref 70–99)

## 2019-05-27 SURGERY — PRESSURE SENSOR/CARDIOMEMS
Anesthesia: LOCAL

## 2019-05-27 MED ORDER — LIDOCAINE HCL (PF) 1 % IJ SOLN
INTRAMUSCULAR | Status: DC | PRN
  Administered 2019-05-27: 15 mL

## 2019-05-27 MED ORDER — LABETALOL HCL 5 MG/ML IV SOLN: 10.0000 mg | INTRAVENOUS | Status: DC | PRN

## 2019-05-27 MED ORDER — ASPIRIN 81 MG PO CHEW
CHEWABLE_TABLET | ORAL | Status: AC
  Administered 2019-05-27: 81 mg via ORAL
  Filled 2019-05-27: qty 1

## 2019-05-27 MED ORDER — ONDANSETRON HCL 4 MG/2ML IJ SOLN: 4.0000 mg | Freq: Four times a day (QID) | INTRAMUSCULAR | Status: DC | PRN

## 2019-05-27 MED ORDER — FENTANYL CITRATE (PF) 100 MCG/2ML IJ SOLN
INTRAMUSCULAR | Status: AC
  Filled 2019-05-27: qty 2

## 2019-05-27 MED ORDER — CLOPIDOGREL BISULFATE 75 MG PO TABS
ORAL_TABLET | ORAL | Status: DC | PRN
  Administered 2019-05-27: 75 mg via ORAL

## 2019-05-27 MED ORDER — CLOPIDOGREL BISULFATE 75 MG PO TABS: 75.0000 mg | ORAL_TABLET | Freq: Every day | ORAL | 0 refills | Status: DC

## 2019-05-27 MED ORDER — SODIUM CHLORIDE 0.9% FLUSH: 3.0000 mL | Freq: Two times a day (BID) | INTRAVENOUS | Status: DC

## 2019-05-27 MED ORDER — LIDOCAINE HCL (PF) 1 % IJ SOLN
INTRAMUSCULAR | Status: AC
  Filled 2019-05-27: qty 30

## 2019-05-27 MED ORDER — FENTANYL CITRATE (PF) 100 MCG/2ML IJ SOLN
INTRAMUSCULAR | Status: DC | PRN
  Administered 2019-05-27: 25 ug via INTRAVENOUS

## 2019-05-27 MED ORDER — SODIUM CHLORIDE 0.9 % IV SOLN: 250.0000 mL | INTRAVENOUS | Status: DC | PRN

## 2019-05-27 MED ORDER — MIDAZOLAM HCL 2 MG/2ML IJ SOLN
INTRAMUSCULAR | Status: AC
  Filled 2019-05-27: qty 2

## 2019-05-27 MED ORDER — IOHEXOL 350 MG/ML SOLN
INTRAVENOUS | Status: DC | PRN
  Administered 2019-05-27: 40 mL

## 2019-05-27 MED ORDER — HEPARIN (PORCINE) IN NACL 1000-0.9 UT/500ML-% IV SOLN
INTRAVENOUS | Status: AC
  Filled 2019-05-27: qty 500

## 2019-05-27 MED ORDER — SODIUM CHLORIDE 0.9% FLUSH: 3.0000 mL | INTRAVENOUS | Status: DC | PRN

## 2019-05-27 MED ORDER — CLOPIDOGREL BISULFATE 75 MG PO TABS
ORAL_TABLET | ORAL | Status: AC
  Filled 2019-05-27: qty 1

## 2019-05-27 MED ORDER — ACETAMINOPHEN 325 MG PO TABS
650.0000 mg | ORAL_TABLET | ORAL | Status: DC | PRN
  Administered 2019-05-27: 650 mg via ORAL
  Filled 2019-05-27: qty 2

## 2019-05-27 MED ORDER — HEPARIN (PORCINE) IN NACL 1000-0.9 UT/500ML-% IV SOLN
INTRAVENOUS | Status: DC | PRN
  Administered 2019-05-27 (×3): 500 mL

## 2019-05-27 MED ORDER — SODIUM CHLORIDE 0.9 % IV SOLN
INTRAVENOUS | Status: DC
  Administered 2019-05-27: 07:00:00 via INTRAVENOUS

## 2019-05-27 MED ORDER — MIDAZOLAM HCL 2 MG/2ML IJ SOLN
INTRAMUSCULAR | Status: DC | PRN
  Administered 2019-05-27: 1 mg via INTRAVENOUS

## 2019-05-27 MED ORDER — HYDRALAZINE HCL 20 MG/ML IJ SOLN: 10.0000 mg | INTRAMUSCULAR | Status: DC | PRN

## 2019-05-27 MED ORDER — ASPIRIN 81 MG PO CHEW
81.0000 mg | CHEWABLE_TABLET | ORAL | Status: AC
  Administered 2019-05-27: 07:00:00 81 mg via ORAL

## 2019-05-27 MED ORDER — CLOPIDOGREL BISULFATE 75 MG PO TABS: 75.0000 mg | ORAL_TABLET | Freq: Every day | ORAL | Status: DC

## 2019-05-27 SURGICAL SUPPLY — 12 items
CARDIOMEMS PA SENSOR W/DELIVER (Prosthesis & Implant Heart) ×2 IMPLANT
CATH SWAN GANZ 7F STRAIGHT (CATHETERS) ×1 IMPLANT
KIT HEART LEFT (KITS) ×1 IMPLANT
PACK CARDIAC CATHETERIZATION (CUSTOM PROCEDURE TRAY) ×1 IMPLANT
SENSOR CARDIOMEMS PA W/DELIVER (Prosthesis & Implant Heart) IMPLANT
SHEATH AVANTI 11F 11CM (SHEATH) ×1 IMPLANT
SHEATH PINNACLE 7F 10CM (SHEATH) ×1 IMPLANT
SHEATH PROBE COVER 6X72 (BAG) ×1 IMPLANT
TRANSDUCER W/STOPCOCK (MISCELLANEOUS) ×1 IMPLANT
WIRE EMERALD 3MM-J .025X260CM (WIRE) ×1 IMPLANT
WIRE EMERALD 3MM-J .035X150CM (WIRE) ×1 IMPLANT
WIRE NITREX .018X300 STIFF (WIRE) ×1 IMPLANT

## 2019-05-27 NOTE — Telephone Encounter (Signed)
-----   Message from Sueanne Margarita, MD sent at 05/24/2019  5:01 PM EST ----- Please let patient know that they have very severe sleep apnea and recommend CPAP titration. Please set up titration in the sleep lab ASAP

## 2019-05-27 NOTE — Progress Notes (Signed)
Site area: rt groin fv sheath Site Prior to Removal:  Level 0 Pressure Applied For: 15 minutes Manual:   yes Patient Status During Pull:  stable Post Pull Site:  Level 0 Post Pull Instructions Given:  yes Post Pull Pulses Present: rt pt palpable Dressing Applied:  Gauze and tegaderm Bedrest begins @ 1010 Comments:  IV saline locked

## 2019-05-27 NOTE — Telephone Encounter (Signed)
Informed patient of sleep study results and patient understanding was verbalized. Patient understands his sleep study showed that they have very severe sleep apnea and recommend CPAP titration. Please set up titration in the sleep lab ASAP. Pt is aware of his results.  cpap tittration to be scheduled

## 2019-05-27 NOTE — Interval H&P Note (Signed)
History and Physical Interval Note:  05/27/2019 8:49 AM  [name]  has presented today for surgery, with the diagnosis of heart failure.  The various methods of treatment have been discussed with the patient and family. After consideration of risks, benefits and other options for treatment, the patient has consented to  Procedure(s): PRESSURE SENSOR/CARDIOMEMS (N/A) as a surgical intervention.  The patient's history has been reviewed, patient examined, no change in status, stable for surgery.  I have reviewed the patient's chart and labs.  Questions were answered to the patient's satisfaction.     Ajit Errico [employer]

## 2019-05-27 NOTE — Discharge Instructions (Signed)
Implantable Cardiac Device Battery Change, Care After This sheet gives you information about how to care for yourself after your procedure. Your health care provider may also give you more specific instructions. If you have problems or questions, contact your health care provider. What can I expect after the procedure? After your procedure, it is common to have:  Pain or soreness at the site where the cardiac device was inserted.  Swelling at the site where the cardiac device was inserted.  You should received an information card for your new device in 4-8 weeks. Follow these instructions at home: Incision care   Keep the incision clean and dry. ? Do not take baths, swim, or use a hot tub until after your wound check.  ? Do not shower for at least 7 days, or as directed by your health care provider. ? Pat the area dry with a clean towel. Do not rub the area. This may cause bleeding.  Follow instructions from your health care provider about how to take care of your incision. Make sure you: ? Leave stitches (sutures), skin glue, or adhesive strips in place. These skin closures may need to stay in place for 2 weeks or longer. If adhesive strip edges start to loosen and curl up, you may trim the loose edges. Do not remove adhesive strips completely unless your health care provider tells you to do that.  Check your incision area every day for signs of infection. Check for: ? More redness, swelling, or pain. ? More fluid or blood. ? Warmth. ? Pus or a bad smell. Activity  Do not lift anything that is heavier than 10 lb (4.5 kg) until your health care provider says it is okay to do so.  For the first week, or as long as told by your health care provider: ? Avoid lifting your affected arm higher than your shoulder. ? After 1 week, Be gentle when you move your arms over your head. It is okay to raise your arm to comb your hair. ? Avoid strenuous exercise.  Ask your health care provider when  it is okay to: ? Resume your normal activities. ? Return to work or school. ? Resume sexual activity. Eating and drinking  Eat a heart-healthy diet. This should include plenty of fresh fruits and vegetables, whole grains, low-fat dairy products, and lean protein like chicken and fish.  Limit alcohol intake to no more than 1 drink a day for non-pregnant women and 2 drinks a day for men. One drink equals 12 oz of beer, 5 oz of wine, or 1 oz of hard liquor.  Check ingredients and nutrition facts on packaged foods and beverages. Avoid the following types of food: ? Food that is high in salt (sodium). ? Food that is high in saturated fat, like full-fat dairy or red meat. ? Food that is high in trans fat, like fried food. ? Food and drinks that are high in sugar. Lifestyle  Do not use any products that contain nicotine or tobacco, such as cigarettes and e-cigarettes. If you need help quitting, ask your health care provider.  Take steps to manage and control your weight.  Once cleared, get regular exercise. Aim for 150 minutes of moderate-intensity exercise (such as walking or yoga) or 75 minutes of vigorous exercise (such as running or swimming) each week.  Manage other health problems, such as diabetes or high blood pressure. Ask your health care provider how you can manage these conditions. General instructions  Do not  drive for 24 hours after your procedure if you were given a medicine to help you relax (sedative).  Take over-the-counter and prescription medicines only as told by your health care provider.  Avoid putting pressure on the area where the cardiac device was placed.  If you need an MRI after your cardiac device has been placed, be sure to tell the health care provider who orders the MRI that you have a cardiac device.  Avoid close and prolonged exposure to electrical devices that have strong magnetic fields. These include: ? Cell phones. Avoid keeping them in a pocket  near the cardiac device, and try using the ear opposite the cardiac device. ? MP3 players. ? Household appliances, like microwaves. ? Metal detectors. ? Electric generators. ? High-tension wires.  Keep all follow-up visits as directed by your health care provider. This is important. Contact a health care provider if:  You have pain at the incision site that is not relieved by over-the-counter or prescription medicines.  You have any of these around your incision site or coming from it: ? More redness, swelling, or pain. ? Fluid or blood. ? Warmth to the touch. ? Pus or a bad smell.  You have a fever.  You feel brief, occasional palpitations, light-headedness, or any symptoms that you think might be related to your heart. Get help right away if:  You experience chest pain that is different from the pain at the cardiac device site.  You develop a red streak that extends above or below the incision site.  You experience shortness of breath.  You have palpitations or an irregular heartbeat.  You have light-headedness that does not go away quickly.  You faint or have dizzy spells.  Your pulse suddenly drops or increases rapidly and does not return to normal.  You begin to gain weight and your legs and ankles swell. Summary  After your procedure, it is common to have pain, soreness, and some swelling where the cardiac device was inserted.  Make sure to keep your incision clean and dry. Follow instructions from your health care provider about how to take care of your incision.  Check your incision every day for signs of infection, such as more pain or swelling, pus or a bad smell, warmth, or leaking fluid and blood.  Avoid strenuous exercise and lifting your left arm higher than your shoulder for 2 weeks, or as long as told by your health care provider. This information is not intended to replace advice given to you by your health care provider. Make sure you discuss any  questions you have with your health care provider.Take clopidogrel 75 mg daily x 1 month.   Angiogram, Care After This sheet gives you information about how to care for yourself after your procedure. Your health care provider may also give you more specific instructions. If you have problems or questions, contact your health care provider. What can I expect after the procedure? After the procedure, it is common to have bruising and tenderness at the catheter insertion area. Follow these instructions at home: Insertion site care  Follow instructions from your health care provider about how to take care of your insertion site. Make sure you: ? Wash your hands with soap and water before you change your bandage (dressing). If soap and water are not available, use hand sanitizer. ? Change your dressing as told by your health care provider. ? Leave stitches (sutures), skin glue, or adhesive strips in place. These skin closures may need  to stay in place for 2 weeks or longer. If adhesive strip edges start to loosen and curl up, you may trim the loose edges. Do not remove adhesive strips completely unless your health care provider tells you to do that.  Do not take baths, swim, or use a hot tub until your health care provider approves.  You may shower 24-48 hours after the procedure or as told by your health care provider. ? Gently wash the site with plain soap and water. ? Pat the area dry with a clean towel. ? Do not rub the site. This may cause bleeding.  Do not apply powder or lotion to the site. Keep the site clean and dry.  Check your insertion site every day for signs of infection. Check for: ? Redness, swelling, or pain. ? Fluid or blood. ? Warmth. ? Pus or a bad smell. Activity  Rest as told by your health care provider, usually for 1-2 days.  Do not lift anything that is heavier than 10 lbs. (4.5 kg) or as told by your health care provider.  Do not drive for 24 hours if you were  given a medicine to help you relax (sedative).  Do not drive or use heavy machinery while taking prescription pain medicine. General instructions   Return to your normal activities as told by your health care provider, usually in about a week. Ask your health care provider what activities are safe for you.  If the catheter site starts bleeding, lie flat and put pressure on the site. If the bleeding does not stop, get help right away. This is a medical emergency.    .  Take over-the-counter and prescription medicines only as told by your health care provider.  Keep all follow-up visits as told by your health care provider. This is important. Contact a health care provider if:  You have a fever or chills.  You have redness, swelling, or pain around your insertion site.  You have fluid or blood coming from your insertion site.  The insertion site feels warm to the touch.  You have pus or a bad smell coming from your insertion site.  You have bruising around the insertion site.  You notice blood collecting in the tissue around the catheter site (hematoma). The hematoma may be painful to the touch. Get help right away if:  You have severe pain at the catheter insertion area.  The catheter insertion area swells very fast.  The catheter insertion area is bleeding, and the bleeding does not stop when you hold steady pressure on the area.  The area near or just beyond the catheter insertion site becomes pale, cool, tingly, or numb. These symptoms may represent a serious problem that is an emergency. Do not wait to see if the symptoms will go away. Get medical help right away. Call your local emergency services (911 in the [place] Do not drive yourself to the hospital. Summary  After the procedure, it is common to have bruising and tenderness at the catheter insertion area.  After the procedure, it is important to rest and drink plenty of fluids.  Do not take baths, swim, or use  a hot tub until your health care provider says it is okay to do so. You may shower 24-48 hours after the procedure or as told by your health care provider.  If the catheter site starts bleeding, lie flat and put pressure on the site. If the bleeding does not stop, get help right away. This  is a medical emergency. This information is not intended to replace advice given to you by your health care provider. Make sure you discuss any questions you have with your health care provider. Document Released: 01/04/2005 Document Revised: 05/31/2017 Document Reviewed: 05/23/2016 Elsevier Patient Education  2020 [employer].

## 2019-05-29 ENCOUNTER — Encounter (HOSPITAL_COMMUNITY): Payer: Self-pay | Admitting: Cardiology

## 2019-06-01 ENCOUNTER — Encounter (HOSPITAL_COMMUNITY): Payer: Self-pay | Admitting: Cardiology

## 2019-06-02 ENCOUNTER — Telehealth (HOSPITAL_COMMUNITY): Payer: Self-pay | Admitting: Cardiology

## 2019-06-02 NOTE — Telephone Encounter (Signed)
Confirmed patient is having technical  Difficulties Tech support to visit patients home 12/1

## 2019-06-02 NOTE — Telephone Encounter (Signed)
Patient has not sent cardio mems transmission since implant  Called patient to followup/assess  [facility]

## 2019-06-03 ENCOUNTER — Encounter (HOSPITAL_COMMUNITY): Payer: Self-pay | Admitting: Cardiology

## 2019-06-03 ENCOUNTER — Telehealth: Payer: Self-pay | Admitting: *Deleted

## 2019-06-03 ENCOUNTER — Other Ambulatory Visit (HOSPITAL_COMMUNITY): Payer: Self-pay

## 2019-06-03 ENCOUNTER — Ambulatory Visit (HOSPITAL_COMMUNITY)
Admission: RE | Admit: 2019-06-03 | Discharge: 2019-06-03 | Disposition: A | Discharge status: Home / Self Care | Payer: Medicare Other | Source: Ambulatory Visit | Attending: Cardiology | Admitting: Cardiology

## 2019-06-03 ENCOUNTER — Other Ambulatory Visit: Payer: Self-pay

## 2019-06-03 VITALS — BP 100/66 | HR 82 | Wt 212.8 lb

## 2019-06-03 DIAGNOSIS — E1122 Type 2 diabetes mellitus with diabetic chronic kidney disease: Secondary | ICD-10-CM | POA: Diagnosis not present

## 2019-06-03 DIAGNOSIS — Z7902 Long term (current) use of antithrombotics/antiplatelets: Secondary | ICD-10-CM | POA: Insufficient documentation

## 2019-06-03 DIAGNOSIS — N179 Acute kidney failure, unspecified: Secondary | ICD-10-CM | POA: Diagnosis not present

## 2019-06-03 DIAGNOSIS — I5032 Chronic diastolic (congestive) heart failure: Secondary | ICD-10-CM | POA: Diagnosis present

## 2019-06-03 DIAGNOSIS — Z7984 Long term (current) use of oral hypoglycemic drugs: Secondary | ICD-10-CM | POA: Diagnosis not present

## 2019-06-03 DIAGNOSIS — Z8673 Personal history of transient ischemic attack (TIA), and cerebral infarction without residual deficits: Secondary | ICD-10-CM | POA: Insufficient documentation

## 2019-06-03 DIAGNOSIS — I5022 Chronic systolic (congestive) heart failure: Secondary | ICD-10-CM

## 2019-06-03 DIAGNOSIS — I447 Left bundle-branch block, unspecified: Secondary | ICD-10-CM | POA: Insufficient documentation

## 2019-06-03 DIAGNOSIS — I712 Thoracic aortic aneurysm, without rupture: Secondary | ICD-10-CM | POA: Insufficient documentation

## 2019-06-03 DIAGNOSIS — I130 Hypertensive heart and chronic kidney disease with heart failure and stage 1 through stage 4 chronic kidney disease, or unspecified chronic kidney disease: Secondary | ICD-10-CM | POA: Diagnosis not present

## 2019-06-03 DIAGNOSIS — G4733 Obstructive sleep apnea (adult) (pediatric): Secondary | ICD-10-CM | POA: Diagnosis not present

## 2019-06-03 DIAGNOSIS — I5042 Chronic combined systolic (congestive) and diastolic (congestive) heart failure: Secondary | ICD-10-CM | POA: Diagnosis not present

## 2019-06-03 DIAGNOSIS — E11621 Type 2 diabetes mellitus with foot ulcer: Secondary | ICD-10-CM | POA: Insufficient documentation

## 2019-06-03 DIAGNOSIS — I2510 Atherosclerotic heart disease of native coronary artery without angina pectoris: Secondary | ICD-10-CM | POA: Insufficient documentation

## 2019-06-03 DIAGNOSIS — Z7982 Long term (current) use of aspirin: Secondary | ICD-10-CM | POA: Diagnosis not present

## 2019-06-03 DIAGNOSIS — Z8674 Personal history of sudden cardiac arrest: Secondary | ICD-10-CM | POA: Insufficient documentation

## 2019-06-03 DIAGNOSIS — E785 Hyperlipidemia, unspecified: Secondary | ICD-10-CM | POA: Insufficient documentation

## 2019-06-03 DIAGNOSIS — Z79899 Other long term (current) drug therapy: Secondary | ICD-10-CM | POA: Diagnosis not present

## 2019-06-03 DIAGNOSIS — N183 Chronic kidney disease, stage 3 unspecified: Secondary | ICD-10-CM | POA: Diagnosis not present

## 2019-06-03 LAB — BASIC METABOLIC PANEL
Anion gap: 16 — ABNORMAL HIGH (ref 5–15)
BUN: 41 mg/dL — ABNORMAL HIGH (ref 8–23)
CO2: 27 mmol/L (ref 22–32)
Calcium: 7.9 mg/dL — ABNORMAL LOW (ref 8.9–10.3)
Chloride: 97 mmol/L — ABNORMAL LOW (ref 98–111)
Creatinine, Ser: 1.87 mg/dL — ABNORMAL HIGH (ref 0.61–1.24)
GFR calc Af Amer: 42 mL/min — ABNORMAL LOW (ref >60)
GFR calc non Af Amer: 36 mL/min — ABNORMAL LOW (ref >60)
Glucose, Bld: 174 mg/dL — ABNORMAL HIGH (ref 70–99)
Potassium: 3.6 mmol/L (ref 3.5–5.1)
Sodium: 140 mmol/L (ref 135–145)

## 2019-06-03 NOTE — Progress Notes (Signed)
Advanced Heart Failure Clinic Note  HF Cardiology: Dr. Aundra Dubin  Reason for Visit: follow-up for chronic Diastolic CHF  68 y.o. with history of CHF, [name], [name] for followup of CHF.  He has a history of mild LV systolic dysfunction (EF 22-02% range) with probably significant RV failure.  Patient was admitted in 4/20 with hypoxic respiratory failure due to PNA and decompensated HF.  Hospitalization was complicated by PEA arrest and AKI.   In 6/20, he was again admitted with hypoxemic respiratory failure with hypertensive crisis, CHF, and PNA.    Due to frequent hospital admission for CHF, he recently underwent Cardiomems implantation, done in 11/20.  RHC at that time showed optimized filling pressures after adjustment of torsemide. He was also recently set up for outpatient sleep study evaluation for fatigue, snoring and excessive daytime sleepiness. He was found to have severe OSA and is being set up for CPAP titration. He was also recently referred to HF paramedicine program.   Back today for f/u. He seems to be doing well.  Breathing is better, walking on flat ground without dyspnea.  No orthopnea/PND.  No chest pain.  No lightheadedness.  Still with daytime sleepiness and fatigue. He uses oxygen at night.   PADP (Cardiomems) 12 mmHg  Labs (4/20): LDL 91 Labs (9/20): K 4, creatinine 1.95 => 2.1 Labs (10/20): K 4.3, creatinine 1.8 Labs (11/20): K 4.9, creatinine 1.73  PMH: 1. HTN 2. [name] stage 3 3. Type 2 diabetes 4. Hyperlipidemia 5. COPD: Suspected 6. PNA: Recurrent, suspected aspiration.  7. PEA arrest 4/20: Respiratory arrest.  8. LBBB 9. TIA 10. Ascending aortic aneurysm: CT chest in 6/20 with 5.1 cm ascending aortic aneurysm. 11. 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  - Echo (12/19): EF 45-50%.  - Echo (4/20): EF 45-50%, diffuse hypokinesis, normal RV.  - Cardiomems placement (11/20). - RHC (11/20): mean RA 2, PA 33/8, mean  PCWP 11, CI 3.32 12. Ventricular ectopy: 8/20 monitor showed PVCs and NSVT.  13. Peripheral arterial dopplers (10/20): No significant stenosis.    Social History   Socioeconomic History  . Marital status: Married    Spouse name: Not on file  . Number of children: Not on file  . Years of education: Not on file  . Highest education level: Not on file  Occupational History  . Not on file  Social Needs  . Financial resource strain: Not very hard  . Food insecurity    Worry: Never true    Inability: Never true  . Transportation needs    Medical: Patient refused    Non-medical: Patient refused  Tobacco Use  . Smoking status: Never Smoker  . Smokeless tobacco: Never Used  Substance and Sexual Activity  . Alcohol use: No  . Drug use: No  . Sexual activity: Not Currently    Partners: Female    Birth control/protection: None  Lifestyle  . Physical activity    Days per week: 0 days    Minutes per session: 0 min  . Stress: Not at all  Relationships  . Social Herbalist on phone: Patient refused    Gets together: Patient refused    Attends religious service: Patient refused    Active member of club or organization: Patient refused    Attends meetings of clubs or organizations: Patient refused    Relationship status: Patient refused  . Intimate partner violence    Fear of current or ex partner:  No    Emotionally abused: No    Physically abused: No    Forced sexual activity: No  Other Topics Concern  . Not on file  Social History Narrative   Patient lives in private residence with supportive spouse   Family History  Problem Relation Age of Onset  . Emphysema Mother   . Aneurysm Mother   . Emphysema Father   . Coronary artery disease Brother   . Coronary artery disease Brother   . Coronary artery disease Sister    ROS: All systems reviewed and negative except as per HPI.   Current Outpatient Medications  Medication Sig Dispense Refill  . Accu-Chek FastClix  Lancets MISC USE UTD    . ACCU-CHEK GUIDE test strip U TO TEST BLOOD SUGAR QD    . acetaminophen (TYLENOL) 325 MG tablet Take 650 mg by mouth every 6 (six) hours as needed for moderate pain or headache.    . ARTIFICIAL TEAR SOLUTION OP Place 1 drop into both eyes daily as needed (dry eyes).    Marland Kitchen aspirin EC 81 MG tablet Take 81 mg by mouth daily.    Marland Kitchen atorvastatin (LIPITOR) 40 MG tablet Take 1 tablet (40 mg total) by mouth every evening. 30 tablet 0  . Blood Glucose Monitoring Suppl (ACCU-CHEK GUIDE) w/Device KIT U TO TEST BLOOD SUGAR QD    . calcium carbonate (TUMS - DOSED IN MG ELEMENTAL CALCIUM) 500 MG chewable tablet Chew 1 tablet by mouth daily as needed for indigestion or heartburn.    . carvedilol (COREG) 6.25 MG tablet Take 1.5 tablets (9.375 mg total) by mouth 2 (two) times daily. 180 tablet 3  . clopidogrel (PLAVIX) 75 MG tablet Take 1 tablet (75 mg total) by mouth daily. 30 tablet 0  . Dextromethorphan HBr (ROBITUSSIN LINGERING COUGHGELS) 15 MG CAPS Take 15 mg by mouth daily as needed (cough).    . finasteride (PROSCAR) 5 MG tablet Take 5 mg by mouth daily.    Marland Kitchen gabapentin (NEURONTIN) 300 MG capsule Take 300 mg by mouth 4 (four) times daily.     . hydrOXYzine (VISTARIL) 50 MG capsule Take 50 mg by mouth 3 (three) times daily.    . metFORMIN (GLUCOPHAGE) 1000 MG tablet Take 1,000 mg by mouth 2 (two) times a day.     . montelukast (SINGULAIR) 10 MG tablet Take 10 mg by mouth daily.     . Multiple Vitamin (MULTIVITAMIN) tablet Take 1 tablet by mouth daily.    . pantoprazole (PROTONIX) 40 MG tablet Take 1 tablet (40 mg total) by mouth daily. 30 tablet 1  . phenylephrine (SUDAFED PE) 10 MG TABS tablet Take 10 mg by mouth every 4 (four) hours as needed (congestion).    . potassium chloride SA (KLOR-CON) 20 MEQ tablet Take 20 mEq by mouth daily.     . tamsulosin (FLOMAX) 0.4 MG CAPS capsule Take 0.4 mg by mouth daily.    Marland Kitchen torsemide (DEMADEX) 20 MG tablet Take 2 tablets (40 mg total) by mouth  every morning AND 1 tablet (20 mg total) every evening. 90 tablet 6   No current facility-administered medications for this visit.    BP 100/66   Pulse 82   Wt 96.5 kg (212 lb 12.8 oz)   SpO2 94%   BMI 33.33 kg/m  General: NAD Neck: No JVD, no thyromegaly or thyroid nodule.  Lungs: Clear to auscultation bilaterally with normal respiratory effort. CV: Nondisplaced PMI.  Heart regular S1/S2, no S3/S4, 2/6 HSM LLSB.  Lower legs wrapped.  No carotid bruit.  Unable to palpate pedal pulses.  Abdomen: Soft, nontender, no hepatosplenomegaly, no distention.  Skin: Intact without lesions or rashes.  Neurologic: Alert and oriented x 3.  Psych: Normal affect. Extremities: No clubbing or cyanosis.  HEENT: Normal.   Assessment/Plan: 1. Chronic primarily diastolic CHF: Suspect that there is significant RV failure.  Last echo in 4/20 with EF 45-50%.  Cath in 9/12 showed nonobstructive coronary disease. Multiple admits in the last year for CHF.  S/p recent Cardiomems implant 11/20. Weight is down 2 lbs and volume status looks improved. PADP stable at 12 by Cardiomems.  - Continue torsemide 40 qam/20 qpm, BMET today.  - He will continue 1 month of Plavix for Cardiomems then can stop it.  2. [name]: Stage 3.  Will have to follow creatinine closely with increase in torsemide.  3. OSA: recent sleep study c/w severe OSA. Being followed by Dr. Radford Pax. CPAP titration is being arranged.  4. Leg/foot ulcers: Peripheral arterial dopplers did not show significant PAD, suspect venous insufficiency.  5. Recurrent PNA: ?Aspiration.  6. LBBB: Chronic.  7. Ascending aortic aneurysm: CT chest 6/20 with 5.1 cm ascending aortic aneurysm.  He currently would be a poor surgical candidate.   Followup 3 months.   Loralie Champagne 06/03/2019

## 2019-06-03 NOTE — Telephone Encounter (Signed)
-----   Message from Freada Bergeron, [place] sent at 05/27/2019 11:58 AM EST ----- Regarding: schedule recommend CPAP titration. Please set up titration in the sleep lab ASAP

## 2019-06-03 NOTE — Patient Instructions (Signed)
Routine lab work today. Will notify you of abnormal results  Follow up in 3 months  

## 2019-06-04 ENCOUNTER — Telehealth (HOSPITAL_COMMUNITY): Payer: Self-pay

## 2019-06-04 DIAGNOSIS — G4733 Obstructive sleep apnea (adult) (pediatric): Secondary | ICD-10-CM

## 2019-06-04 DIAGNOSIS — I5022 Chronic systolic (congestive) heart failure: Secondary | ICD-10-CM

## 2019-06-04 NOTE — Telephone Encounter (Signed)
Pt aware of lab results, appt made to repeat bmet. Verbalized understanding.

## 2019-06-04 NOTE — Telephone Encounter (Signed)
-----   Message from Larey Dresser, MD sent at 06/03/2019 10:41 PM EST ----- Repeat BMET 2 wks to follow creatinine

## 2019-06-04 NOTE — Progress Notes (Signed)
Paramedicine Encounter   Patient ID: [name] , male,   DOB: 05/18/1951,68 y.o.,  MRN: [id number]   Met patient in clinic today with provider Dr Aundra Dubin.   Pt has taken his morning meds prior to this visit.  He denies sob, chest pain and dizziness. He's wearing compression stalking's daily. Dr. Aundra Dubin strongly advise that he follows up with the staff at the sleep study to get a CPAP. Him and his wife voices they understands.  Pt is to stop plavix at the end of this prescription.  rx bottles verified and pill box filled during this visit.    Time spent with patient 40 mins   [place], [place] 06/04/2019   ACTION: Home visit completed

## 2019-06-04 NOTE — Telephone Encounter (Signed)
Patient is scheduled for lab study on 06/17/19. Pt  is scheduled for COVID screening on 06/15/19 12  prior to titration. Patient understands his sleep study will be done at [facility] sleep lab. Patient understands he will receive a sleep packet in a week or so. Patient understands to call if he does not receive the sleep packet in a timely manner. Patient was thankful for the call.

## 2019-06-05 ENCOUNTER — Other Ambulatory Visit: Payer: Self-pay

## 2019-06-05 ENCOUNTER — Encounter: Payer: Self-pay | Admitting: Podiatry

## 2019-06-05 ENCOUNTER — Ambulatory Visit (INDEPENDENT_AMBULATORY_CARE_PROVIDER_SITE_OTHER): Payer: Medicare Other | Admitting: Podiatry

## 2019-06-05 DIAGNOSIS — M205X2 Other deformities of toe(s) (acquired), left foot: Secondary | ICD-10-CM

## 2019-06-05 DIAGNOSIS — M205X1 Other deformities of toe(s) (acquired), right foot: Secondary | ICD-10-CM

## 2019-06-05 DIAGNOSIS — M79674 Pain in right toe(s): Secondary | ICD-10-CM

## 2019-06-05 DIAGNOSIS — E119 Type 2 diabetes mellitus without complications: Secondary | ICD-10-CM

## 2019-06-05 DIAGNOSIS — M79675 Pain in left toe(s): Secondary | ICD-10-CM

## 2019-06-05 DIAGNOSIS — B351 Tinea unguium: Secondary | ICD-10-CM

## 2019-06-05 NOTE — Progress Notes (Signed)
Complaint:  Visit Type: Patient returns to my office for continued preventative foot care services. Complaint: Patient states" my nails have grown long and thick and become painful to walk and wear shoes" Patient has been diagnosed with DM with neuropathy.  . The patient presents for preventative foot care services. He requests diabetic shoes.  Podiatric Exam: Vascular: dorsalis pedis and posterior tibial pulses are absent  bilateral. Capillary return is immediate. Temperature gradient is WNL. Skin turgor WNL  Sensorium: Diminished/Absent  Semmes Weinstein monofilament test. Diminished  tactile sensation bilaterally. Nail Exam: Pt has thick disfigured discolored nails with subungual debris noted bilateral entire nail hallux through fifth toenails Ulcer Exam: There is no evidence of ulcer or pre-ulcerative changes or infection. Orthopedic Exam: Muscle tone and strength are WNL. No limitations in general ROM. No crepitus or effusions noted. Foot type and digits show no abnormalities.Hallux limitus 1st MPJ  B/L.  Midfoot DJD  B/L. Limitation  ROM rearfoot  B/L. Skin: No Porokeratosis. No infection or ulcers  Diagnosis:  Onychomycosis, , Pain in right toe, pain in left toes  Treatment & Plan Procedures and Treatment: Consent by patient was obtained for treatment procedures.   Debridement of mycotic and hypertrophic toenails, 1 through 5 bilateral and clearing of subungual debris. No ulceration, no infection noted. Patient was reexamined for his diabetic feet and no pulses were noted.  He also was found to have diminished LOPS or absent LOPS.  Patient to return in 1 week for diabetic shoes.   Return Visit-Office Procedure: Patient instructed to return to the office for a follow up visit 3 months for continued  Preventative foot care services.Gardiner Barefoot DPM

## 2019-06-09 NOTE — Progress Notes (Signed)
[name], [name] ([id number]) Visit Report for 03/25/2019 Chief Complaint Document Details Patient Name: Date of Service: [name], [name] 03/25/2019 10:30 AM Medical Record [id number] Patient Account Number: [id number] Date of Birth/Sex: Treating RN: 1950-07-16 (68 y.o. Male) Zenaida Deed Primary Care Provider: CLINIC, MEDICAL Other Clinician: Referring Provider: Treating Provider/Extender:Stone III, Lavonna Monarch, Scott Weeks in Treatment: 0 Information Obtained from: Patient Chief Complaint Bilateral LE and foot ulcers Electronic Signature(s) Signed: 03/25/2019 11:45:57 AM By: Lenda Kelp PA-C Entered By: Lenda Kelp on 03/25/2019 11:45:57 -------------------------------------------------------------------------------- HPI Details Patient Name: Date of Service: [name], [name] 03/25/2019 10:30 AM Medical Record [id number] Patient Account Number: [id number] Date of Birth/Sex: Treating RN: 12-Feb-1951 (68 y.o. Male) Zenaida Deed Primary Care Provider: CLINIC, MEDICAL Other Clinician: Referring Provider: Treating Provider/Extender:Stone III, Lavonna Monarch, Scott Weeks in Treatment: 0 History of Present Illness HPI Description: 03/25/2019 patient presents today for initial evaluation in our clinic concerning wounds on the bilateral lower extremities. He has a significant medical history which is positive for chronic venous insufficiency, lymphedema, diabetes mellitus type 2, congestive heart failure, hypertension, atherosclerotic heart disease, and stage III chronic kidney disease. His issue at this point is that he has had for the past 2 months problems with edema and swelling of the bilateral lower extremities and apparently this is after he had issues with his heart. His ejection fraction is 45 to 50% based on his last test. He was in the hospital with regard to this and during the time in the hospital was given Silvadene cream by the wound care ostomy nurse to be  applied to the legs. No wrap was recommended according to what the patient and his wife tell me at this time. With that being said I do think that the patient likely needs bilateral compression wraps to try to help get some of the fluid out of legs and help the wounds to heal. I do not see any signs of active infection which is good news. No fevers, chills, nausea, vomiting, or diarrhea.Patient is not having severe pain although he does have some discomfort on the bilateral lower extremities with cleaning over the area. Electronic Signature(s) Signed: 03/25/2019 1:25:53 PM By: Lenda Kelp PA-C Entered By: Lenda Kelp on 03/25/2019 13:25:52 -------------------------------------------------------------------------------- Physical Exam Details Patient Name: Date of Service: [name], [name] 03/25/2019 10:30 AM Medical Record [id number] Patient Account Number: [id number] Date of Birth/Sex: Treating RN: September 02, 1950 (68 y.o. Male) Zenaida Deed Primary Care Provider: CLINIC, MEDICAL Other Clinician: Referring Provider: Treating Provider/Extender:Stone III, Lavonna Monarch, Scott Weeks in Treatment: 0 Constitutional Well-nourished and well-hydrated in no acute distress. Eyes conjunctiva clear no eyelid edema noted. pupils equal round and reactive to light and accommodation. Ears, Nose, Mouth, and Throat no gross abnormality of ear auricles or external auditory canals. normal hearing noted during conversation. mucus membranes moist. Respiratory normal breathing without difficulty. clear to auscultation bilaterally. Cardiovascular regular rate and rhythm with normal S1, S2. 1+ dorsalis pedis/posterior tibialis pulses. no clubbing, cyanosis, significant edema, <3 sec cap refill. Gastrointestinal (GI) soft, non-tender, non-distended, +BS. no ventral hernia noted. Musculoskeletal unsteady while walking. no significant deformity or arthritic changes, no loss or range of motion, no  clubbing. Psychiatric this patient is able to make decisions and demonstrates good insight into disease process. Alert and Oriented x 3. pleasant and cooperative. Notes Patient's wounds currently actually appear to be multiple lymphedema type open wounds over the bilateral lower extremities. Fortunately there is no signs of active infection at this time. No fever chills noted. Overall I  think that the patient really needs mainly compression to try to see things improved from the standpoint of his lower extremity edema and I think that we will cease the weeping and allow the last final areas to close. Other than that I do not think there is good to be a whole lot we have to do treatment wise definitely no sign of any need for debridement upon evaluation today. Electronic Signature(s) Signed: 03/25/2019 1:27:00 PM By: Lenda Kelp PA-C Entered By: Lenda Kelp on 03/25/2019 13:26:59 -------------------------------------------------------------------------------- Physician Orders Details Patient Name: Date of Service: [name], [name] 03/25/2019 10:30 AM Medical Record [id number] Patient Account Number: [id number] Date of Birth/Sex: Treating RN: June 22, 1951 (68 y.o. Male) Zenaida Deed Primary Care Provider: CLINIC, MEDICAL Other Clinician: Referring Provider: Treating Provider/Extender:Stone III, Lavonna Monarch, Scott Weeks in Treatment: 0 Verbal / Phone Orders: No Diagnosis Coding ICD-10 Coding Code Description I87.2 Venous insufficiency (chronic) (peripheral) I89.0 Lymphedema, not elsewhere classified E11.621 Type 2 diabetes mellitus with foot ulcer L97.812 Non-pressure chronic ulcer of other part of right lower leg with fat layer exposed L97.822 Non-pressure chronic ulcer of other part of left lower leg with fat layer exposed L97.522 Non-pressure chronic ulcer of other part of left foot with fat layer exposed L97.512 Non-pressure chronic ulcer of other part of right foot  with fat layer exposed I50.42 Chronic combined systolic (congestive) and diastolic (congestive) heart failure I10 Essential (primary) hypertension I25.10 Atherosclerotic heart disease of native coronary artery without angina pectoris N18.3 Chronic kidney disease, stage 3 (moderate) Follow-up Appointments Return Appointment in 1 week. Dressing Change Frequency Do not change entire dressing for one week. Skin Barriers/Peri-Wound Care TCA Cream or Ointment - to legs Wound Cleansing May shower with protection. Primary Wound Dressing Wound #1 Right,Circumferential Lower Leg Calcium Alginate with Silver Wound #2 Left,Circumferential Lower Leg Calcium Alginate with Silver Wound #3 Right,Dorsal Foot Calcium Alginate with Silver Wound #4 Left,Dorsal Foot Calcium Alginate with Silver Wound #5 Left Toe Great Calcium Alginate with Silver Secondary Dressing Wound #1 Right,Circumferential Lower Leg ABD pad Kerramax Wound #2 Left,Circumferential Lower Leg ABD pad Kerramax Wound #3 Right,Dorsal Foot Dry Gauze Wound #4 Left,Dorsal Foot Dry Gauze Wound #5 Left Toe Great Kerlix/Rolled Gauze Dry Gauze Edema Control 3 Layer Compression System - Bilateral Avoid standing for long periods of time Elevate legs to the level of the heart or above for 30 minutes daily and/or when sitting, a frequency of: - at least 4 times per day Exercise regularly - walking is encouraged Electronic Signature(s) Signed: 03/25/2019 5:03:35 PM By: Lenda Kelp PA-C Signed: 03/25/2019 5:29:01 PM By: Zenaida Deed RN, BSN Entered By: Zenaida Deed on 03/25/2019 11:58:06 -------------------------------------------------------------------------------- Problem List Details Patient Name: Date of Service: [name], [name] 03/25/2019 10:30 AM Medical Record [id number] Patient Account Number: [id number] Date of Birth/Sex: Treating RN: [birth year] (68 y.o. Male) Zenaida Deed Primary Care Provider: CLINIC,  MEDICAL Other Clinician: Referring Provider: Treating Provider/Extender:Stone III, Lavonna Monarch, Scott Weeks in Treatment: 0 Active Problems ICD-10 Evaluated Encounter Code Description Active Date Today Diagnosis I87.2 Venous insufficiency (chronic) (peripheral) 03/25/2019 No Yes I89.0 Lymphedema, not elsewhere classified 03/25/2019 No Yes E11.621 Type 2 diabetes mellitus with foot ulcer 03/25/2019 No Yes L97.812 Non-pressure chronic ulcer of other part of right lower 03/25/2019 No Yes leg with fat layer exposed L97.822 Non-pressure chronic ulcer of other part of left lower 03/25/2019 No Yes leg with fat layer exposed L97.522 Non-pressure chronic ulcer of other part of left foot 03/25/2019 No Yes with fat layer exposed L97.512 Non-pressure  chronic ulcer of other part of right foot 03/25/2019 No Yes with fat layer exposed I50.42 Chronic combined systolic (congestive) and diastolic 03/25/2019 No Yes (congestive) heart failure I10 Essential (primary) hypertension 03/25/2019 No Yes I25.10 Atherosclerotic heart disease of native coronary artery 03/25/2019 No Yes without angina pectoris N18.3 Chronic kidney disease, stage 3 (moderate) 03/25/2019 No Yes Inactive Problems Resolved Problems Electronic Signature(s) Signed: 03/25/2019 11:50:24 AM By: Lenda Kelp PA-C Previous Signature: 03/25/2019 11:45:29 AM Version By: Lenda Kelp PA-C Entered By: Lenda Kelp on 03/25/2019 11:50:23 -------------------------------------------------------------------------------- Progress Note Details Patient Name: Date of Service: [name], [name] 03/25/2019 10:30 AM Medical Record [id number] Patient Account Number: [id number] Date of Birth/Sex: Treating RN: 04-03-1951 (68 y.o. Male) Zenaida Deed Primary Care Provider: Other Clinician: CLINIC, MEDICAL Referring Provider: Treating Provider/Extender:Stone III, Lavonna Monarch, Scott Weeks in Treatment: 0 Subjective Chief Complaint Information  obtained from Patient Bilateral LE and foot ulcers History of Present Illness (HPI) 03/25/2019 patient presents today for initial evaluation in our clinic concerning wounds on the bilateral lower extremities. He has a significant medical history which is positive for chronic venous insufficiency, lymphedema, diabetes mellitus type 2, congestive heart failure, hypertension, atherosclerotic heart disease, and stage III chronic kidney disease. His issue at this point is that he has had for the past 2 months problems with edema and swelling of the bilateral lower extremities and apparently this is after he had issues with his heart. His ejection fraction is 45 to 50% based on his last test. He was in the hospital with regard to this and during the time in the hospital was given Silvadene cream by the wound care ostomy nurse to be applied to the legs. No wrap was recommended according to what the patient and his wife tell me at this time. With that being said I do think that the patient likely needs bilateral compression wraps to try to help get some of the fluid out of legs and help the wounds to heal. I do not see any signs of active infection which is good news. No fevers, chills, nausea, vomiting, or diarrhea.Patient is not having severe pain although he does have some discomfort on the bilateral lower extremities with cleaning over the area. Patient History Information obtained from Patient. Allergies Percocet, cashew nut Family History Cancer - Siblings,Father, Heart Disease - Maternal Grandparents,Mother,Siblings, Hypertension - Mother,Siblings, Lung Disease - Father,Mother, Stroke - Mother, No family history of Diabetes, Hereditary Spherocytosis, Kidney Disease, Seizures, Thyroid Problems, Tuberculosis. Social History Never smoker, Marital Status - Married, Alcohol Use - Never, Drug Use - No History, Caffeine Use - Daily. Medical History Eyes Denies history of Cataracts, Glaucoma, Optic  Neuritis Hematologic/Lymphatic Denies history of Anemia, Hemophilia, Human Immunodeficiency Virus, Lymphedema, Sickle Cell Disease Respiratory Denies history of Aspiration, Asthma, Chronic Obstructive Pulmonary Disease (COPD), Pneumothorax, Sleep Apnea, Tuberculosis Cardiovascular Patient has history of Congestive Heart Failure, Coronary Artery Disease, Hypertension Denies history of Angina, Arrhythmia, Deep Vein Thrombosis, Hypotension, Myocardial Infarction, Peripheral Arterial Disease, Peripheral Venous Disease, Phlebitis, Vasculitis Gastrointestinal Denies history of Cirrhosis , Colitis, Crohnoos, Hepatitis A, Hepatitis B, Hepatitis C Endocrine Patient has history of Type II Diabetes Denies history of Type I Diabetes Genitourinary Denies history of End Stage Renal Disease Immunological Denies history of Lupus Erythematosus, Raynaudoos, Scleroderma Integumentary (Skin) Denies history of History of Burn Musculoskeletal Denies history of Gout, Rheumatoid Arthritis, Osteoarthritis, Osteomyelitis Neurologic Denies history of Dementia, Neuropathy, Quadriplegia, Paraplegia, Seizure Disorder Oncologic Denies history of Received Chemotherapy, Received Radiation Psychiatric Denies history of Anorexia/bulimia, Confinement  Anxiety Patient is treated with Oral Agents. Review of Systems (ROS) Constitutional Symptoms (General Health) Denies complaints or symptoms of Fatigue, Fever, Chills, Marked Weight Change. Eyes Complains or has symptoms of Vision Changes - legal blind right. Denies complaints or symptoms of Dry Eyes, Glasses / Contacts. Ear/Nose/Mouth/Throat Denies complaints or symptoms of Chronic sinus problems or rhinitis. Respiratory Denies complaints or symptoms of Chronic or frequent coughs, Shortness of Breath. Cardiovascular Denies complaints or symptoms of Chest pain. Gastrointestinal Denies complaints or symptoms of Frequent diarrhea, Nausea,  Vomiting. Endocrine Denies complaints or symptoms of Heat/cold intolerance. Genitourinary Denies complaints or symptoms of Frequent urination. Integumentary (Skin) Complains or has symptoms of Wounds. Musculoskeletal Denies complaints or symptoms of Muscle Pain, Muscle Weakness. Neurologic Denies complaints or symptoms of Numbness/parasthesias. Psychiatric Denies complaints or symptoms of Claustrophobia, Suicidal. Objective Constitutional Well-nourished and well-hydrated in no acute distress. Vitals Time Taken: 10:44 AM, Height: 67 in, Source: Stated, Weight: 222 lbs, Source: Stated, BMI: 34.8, Temperature: 98.4 F, Pulse: 75 bpm, Respiratory Rate: 20 breaths/min, Blood Pressure: 124/78 mmHg. Eyes conjunctiva clear no eyelid edema noted. pupils equal round and reactive to light and accommodation. Ears, Nose, Mouth, and Throat no gross abnormality of ear auricles or external auditory canals. normal hearing noted during conversation. mucus membranes moist. Respiratory normal breathing without difficulty. clear to auscultation bilaterally. Cardiovascular regular rate and rhythm with normal S1, S2. 1+ dorsalis pedis/posterior tibialis pulses. no clubbing, cyanosis, significant edema, Gastrointestinal (GI) soft, non-tender, non-distended, +BS. no ventral hernia noted. Musculoskeletal unsteady while walking. no significant deformity or arthritic changes, no loss or range of motion, no clubbing. Psychiatric this patient is able to make decisions and demonstrates good insight into disease process. Alert and Oriented x 3. pleasant and cooperative. General Notes: Patient's wounds currently actually appear to be multiple lymphedema type open wounds over the bilateral lower extremities. Fortunately there is no signs of active infection at this time. No fever chills noted. Overall I think that the patient really needs mainly compression to try to see things improved from the standpoint of  his lower extremity edema and I think that we will cease the weeping and allow the last final areas to close. Other than that I do not think there is good to be a whole lot we have to do treatment wise definitely no sign of any need for debridement upon evaluation today. Integumentary (Hair, Skin) Wound #1 status is Open. Original cause of wound was Gradually Appeared. The wound is located on the Right,Circumferential Lower Leg. The wound measures 18cm length x 34cm width x 0.1cm depth; 480.664cm^2 area and 48.066cm^3 volume. There is Fat Layer (Subcutaneous Tissue) Exposed exposed. There is no tunneling or undermining noted. There is a large amount of serous drainage noted. There is large (67-100%) pink granulation within the wound bed. There is a small (1-33%) amount of necrotic tissue within the wound bed including Adherent Slough. Wound #2 status is Open. Original cause of wound was Gradually Appeared. The wound is located on the Left,Circumferential Lower Leg. The wound measures 15cm length x 29cm width x 0.1cm depth; 341.648cm^2 area and 34.165cm^3 volume. There is Fat Layer (Subcutaneous Tissue) Exposed exposed. There is no tunneling or undermining noted. There is a large amount of serous drainage noted. There is large (67-100%) pink granulation within the wound bed. There is a small (1-33%) amount of necrotic tissue within the wound bed including Adherent Slough. Wound #3 status is Open. Original cause of wound was Gradually Appeared. The wound is located on  the Right,Dorsal Foot. The wound measures 2.5cm length x 6cm width x 0.1cm depth; 11.781cm^2 area and 1.178cm^3 volume. There is Fat Layer (Subcutaneous Tissue) Exposed exposed. There is no tunneling or undermining noted. There is a large amount of serous drainage noted. There is large (67-100%) pink granulation within the wound bed. There is a small (1-33%) amount of necrotic tissue within the wound bed including Adherent  Slough. Wound #4 status is Open. Original cause of wound was Gradually Appeared. The wound is located on the Left,Dorsal Foot. The wound measures 1.7cm length x 3cm width x 0.1cm depth; 4.006cm^2 area and 0.401cm^3 volume. There is Fat Layer (Subcutaneous Tissue) Exposed exposed. There is no tunneling or undermining noted. There is a large amount of serous drainage noted. There is large (67-100%) pink granulation within the wound bed. There is a small (1-33%) amount of necrotic tissue within the wound bed including Adherent Slough. Wound #5 status is Open. Original cause of wound was Gradually Appeared. The wound is located on the Left Toe Great. The wound measures 0.6cm length x 0.7cm width x 0.1cm depth; 0.33cm^2 area and 0.033cm^3 volume. There is Fat Layer (Subcutaneous Tissue) Exposed exposed. There is no tunneling or undermining noted. There is a large amount of serous drainage noted. There is large (67-100%) pink granulation within the wound bed. There is a small (1-33%) amount of necrotic tissue within the wound bed including Adherent Slough. 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 There was a  Three Layer Compression Therapy Procedure by Shawn Stalleaton, Bobbi, RN. Post procedure Diagnosis Wound #1: Same as Pre-Procedure Wound #2 Pre-procedure diagnosis of Wound #2 is a Venous Leg Ulcer located on the Left,Circumferential Lower Leg . There was a Three Layer Compression Therapy Procedure by Shawn Stalleaton, Bobbi, RN. Post procedure Diagnosis Wound #2: Same as Pre-Procedure Plan Follow-up Appointments: Return Appointment in 1 week. Dressing Change Frequency: Do not change entire dressing for one week. Skin Barriers/Peri-Wound Care: TCA Cream or Ointment - to legs Wound Cleansing: May shower with protection. Primary Wound Dressing: Wound #1 Right,Circumferential Lower Leg: Calcium Alginate with Silver Wound #2 Left,Circumferential Lower Leg: Calcium Alginate with Silver Wound #3 Right,Dorsal Foot: Calcium Alginate with Silver Wound #4 Left,Dorsal Foot: Calcium Alginate with Silver Wound #5 Left Toe Great: Calcium Alginate with Silver Secondary Dressing: Wound #1 Right,Circumferential Lower Leg: ABD pad Kerramax Wound #2 Left,Circumferential Lower Leg: ABD pad Kerramax Wound #3 Right,Dorsal Foot: Dry Gauze Wound #4 Left,Dorsal Foot: Dry Gauze Wound #5 Left Toe Great: Kerlix/Rolled Gauze Dry Gauze Edema Control: 3 Layer Compression System - Bilateral Avoid standing for long periods of time Elevate legs to the level of the heart or above for 30 minutes daily and/or when sitting, a frequency of: - at least 4 times per day Exercise regularly - walking is encouraged 1. I would recommend that we go ahead  and initiate treatment with a silver alginate dressing as I think this will be a good option for helping to control the weeping and dry things up quite a bit. 2. I am also going to suggest that we go ahead and initiate a 3 layer compression wrap with his congestive heart failure I do not want to overdo things we do not want to necessarily do a 4 layer if we do not have to I think the  3 layer may be compression enough for him without causing any other problems. 3. I recommend the patient elevate his legs as much as possible as well and that he exercise regularly to help with edema control as well as avoiding long times spent standing. We will see patient back for reevaluation in 1 week here in the clinic. If anything worsens or changes patient will contact our office for additional recommendations. Electronic Signature(s) Signed: 03/25/2019 2:23:54 PM By: Lenda Kelp PA-C Previous Signature: 03/25/2019 1:28:32 PM Version By: Lenda Kelp PA-C Entered By: Lenda Kelp on 03/25/2019 14:23:54 -------------------------------------------------------------------------------- HxROS Details Patient Name: Date of Service: [name], [name] 03/25/2019 10:30 AM Medical Record [id number] Patient Account Number: [id number] Date of Birth/Sex: Treating RN: [birth year] (68 y.o. Male) Yevonne Pax Primary Care Provider: CLINIC, MEDICAL Other Clinician: Referring Provider: Treating Provider/Extender:Stone III, Lavonna Monarch, Scott Weeks in Treatment: 0 Information Obtained From Patient Constitutional Symptoms (General Health) Complaints and Symptoms: Negative for: Fatigue; Fever; Chills; Marked Weight Change Eyes Complaints and Symptoms: Positive for: Vision Changes - legal blind right Negative for: Dry Eyes; Glasses / Contacts Medical History: Negative for: Cataracts; Glaucoma; Optic Neuritis Ear/Nose/Mouth/Throat Complaints and Symptoms: Negative for: Chronic sinus problems or rhinitis Respiratory Complaints and Symptoms: Negative for: Chronic or frequent coughs; Shortness of Breath Medical History: Negative for: Aspiration; Asthma; Chronic Obstructive Pulmonary Disease (COPD); Pneumothorax; Sleep Apnea; Tuberculosis Cardiovascular Complaints and Symptoms: Negative for: Chest pain Medical History: Positive for: Congestive Heart Failure; Coronary Artery Disease;  Hypertension Negative for: Angina; Arrhythmia; Deep Vein Thrombosis; Hypotension; Myocardial Infarction; Peripheral Arterial Disease; Peripheral Venous Disease; Phlebitis; Vasculitis Gastrointestinal Complaints and Symptoms: Negative for: Frequent diarrhea; Nausea; Vomiting Medical History: Negative for: Cirrhosis ; Colitis; Crohns; Hepatitis A; Hepatitis B; Hepatitis C Endocrine Complaints and Symptoms: Negative for: Heat/cold intolerance Medical History: Positive for: Type II Diabetes Negative for: Type I Diabetes Time with diabetes: 10 Treated with: Oral agents Genitourinary Complaints and Symptoms: Negative for: Frequent urination Medical History: Negative for: End Stage Renal Disease Integumentary (Skin) Complaints and Symptoms: Positive for: Wounds Medical History: Negative for: History of Burn Musculoskeletal Complaints and Symptoms: Negative for: Muscle Pain; Muscle Weakness Medical History: Negative for: Gout; Rheumatoid Arthritis; Osteoarthritis; Osteomyelitis Neurologic Complaints and Symptoms: Negative for: Numbness/parasthesias Medical History: Negative for: Dementia; Neuropathy; Quadriplegia; Paraplegia; Seizure Disorder Psychiatric Complaints and Symptoms: Negative for: Claustrophobia; Suicidal Medical History: Negative for: Anorexia/bulimia; Confinement Anxiety Hematologic/Lymphatic Medical History: Negative for: Anemia; Hemophilia; Human Immunodeficiency Virus; Lymphedema; Sickle Cell Disease Immunological Medical History: Negative for: Lupus Erythematosus; Raynauds; Scleroderma Oncologic Medical History: Negative for: Received Chemotherapy; Received Radiation Immunizations Pneumococcal Vaccine: Received Pneumococcal Vaccination: No Implantable Devices None Family and Social History Cancer: Yes - Siblings,Father; Diabetes: No; Heart Disease: Yes - Maternal Grandparents,Mother,Siblings; Hereditary Spherocytosis: No; Hypertension: Yes -  Mother,Siblings; Kidney Disease: No; Lung Disease: Yes - Father,Mother; Seizures: No; Stroke: Yes - Mother; Thyroid Problems: No; Tuberculosis: No; Never smoker; Marital Status - Married; Alcohol Use: Never; Drug Use: No History; Caffeine Use: Daily; Financial Concerns: No; Food, Clothing or Shelter Needs: No; Support  System Lacking: No; Transportation Concerns: No Electronic Signature(s) Signed: 03/25/2019 5:03:35 PM By: Worthy Keeler PA-C Signed: 06/09/2019 3:04:22 PM By: Carlene Coria RN Entered By: Carlene Coria on 03/25/2019 10:53:52 -------------------------------------------------------------------------------- SuperBill Details Patient Name: Date of Service: [name], [name] 03/25/2019 Medical Record [id number] Patient Account Number: [id number] Date of Birth/Sex: Treating RN: Apr 08, 1951 (68 y.o. 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.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 Modifier Quantity: 1 Physician Procedures CPT4 Code Description: [id number] - WC PHYS LEVEL 4 - NEW PT ICD-10 Diagnosis Description I87.2 Venous insufficiency (chronic) (peripheral) I89.0 Lymphedema, not elsewhere classified L97.822 Non-pressure chronic ulcer of other part of left lower l  L97.812 Non-pressure chronic ulcer of other part of right lower Modifier: eg with fat lay leg with fat la Quantity: 1 er exposed yer exposed Electronic Signature(s) Signed: 03/25/2019 1:28:49 PM By: Worthy Keeler PA-C Entered By: Worthy Keeler on 03/25/2019 13:28:48

## 2019-06-09 NOTE — Progress Notes (Signed)
BERTRAM, HADDIX ([id number]) Visit Report for 03/25/2019 Allergy List Details Patient Name: Date of Service: [name], [name] 03/25/2019 10:30 AM Medical Record [id number] Patient Account Number: [id number] Date of Birth/Sex: Treating RN: 08/07/1950 (68 y.o. Male) Yevonne Pax Primary Care Antonio Creswell: CLINIC, MEDICAL Other Clinician: Referring Katrice Goel: Treating Kelsie Zaborowski/Extender:Stone III, Lavonna Monarch, Scott Weeks in Treatment: 0 Allergies Active Allergies Percocet cashew nut Allergy Notes Electronic Signature(s) Signed: 06/09/2019 3:04:22 PM By: Yevonne Pax RN Entered By: Yevonne Pax on 03/25/2019 10:46:25 -------------------------------------------------------------------------------- Arrival Information Details Patient Name: Date of Service: BENTON, TOOKER 03/25/2019 10:30 AM Medical Record [id number] Patient Account Number: [id number] Date of Birth/Sex: Treating RN: [birth year] (68 y.o. Male) Yevonne Pax Primary Care Bartlett Enke: CLINIC, MEDICAL Other Clinician: Referring Ulani Degrasse: Treating Eurydice Calixto/Extender:Stone III, Lavonna Monarch, Scott Weeks in Treatment: 0 Visit Information Patient Arrived: Ambulatory Arrival Time: 10:38 Accompanied By: wife Transfer Assistance: None Patient Identification Verified: Yes Secondary Verification Process Completed: Yes Patient Requires Transmission-Based No Precautions: Patient Has Alerts: No Electronic Signature(s) Signed: 06/09/2019 3:04:22 PM By: Yevonne Pax RN Entered By: Yevonne Pax on 03/25/2019 10:44:18 -------------------------------------------------------------------------------- Clinic Level of Care Assessment Details Patient Name: Date of Service: TIERNAN, SUTO 03/25/2019 10:30 AM Medical Record [id number] Patient Account Number: [id number] Date of Birth/Sex: Treating RN: 1950-12-30 (68 y.o. Male) Zenaida Deed Primary Care Rawad Bochicchio: CLINIC, MEDICAL Other Clinician: Referring Clarence Cogswell: Treating  Brinly Maietta/Extender:Stone III, Lavonna Monarch, Scott Weeks in Treatment: 0 Clinic Level of Care Assessment Items TOOL 1 Quantity Score  - Use when EandM and Procedure is performed on INITIAL visit 0 ASSESSMENTS - Nursing Assessment / Reassessment X - General Physical Exam (combine w/ comprehensive assessment (listed just below) 1 20 when performed on new pt. evals) X - Comprehensive Assessment (HX, ROS, Risk Assessments, Wounds Hx, etc.) 1 25 ASSESSMENTS - Wound and Skin Assessment / Reassessment  - Dermatologic / Skin Assessment (not related to wound area) 0 ASSESSMENTS - Ostomy and/or Continence Assessment and Care  - Incontinence Assessment and Management 0  - Ostomy Care Assessment and Management (repouching, etc.) 0 PROCESS - Coordination of Care X - Simple Patient / Family Education for ongoing care 1 15  - Complex (extensive) Patient / Family Education for ongoing care 0 X - Staff obtains Chiropractor, Records, Test Results / Process Orders 1 10  - Staff telephones HHA, Nursing Homes / Clarify orders / etc 0  - Routine Transfer to another Facility (non-emergent condition) 0  - Routine Hospital Admission (non-emergent condition) 0 X - New Admissions / Manufacturing engineer / Ordering NPWT, Apligraf, etc. 1 15  - Emergency Hospital Admission (emergent condition) 0 PROCESS - Special Needs  - Pediatric / Minor Patient Management 0  - Isolation Patient Management 0  - Hearing / Language / Visual special needs 0  - Assessment of Community assistance (transportation, D/C planning, etc.) 0  - Additional assistance / Altered mentation 0  - Support Surface(s) Assessment (bed, cushion, seat, etc.) 0 INTERVENTIONS - Miscellaneous  - External ear exam 0  - Patient Transfer (multiple staff / Nurse, adult / Similar devices) 0  - Simple Staple / Suture removal (25 or less) 0  - Complex Staple / Suture removal (26 or more) 0  - Hypo/Hyperglycemic Management  (do not check if billed separately) 0 X - Ankle / Brachial Index (ABI) - do not check if billed separately 1 15 Has the patient been seen at the hospital within the last three years: Yes Total Score: 100 Level Of Care: New/Established - Level 3 Electronic Signature(s) Signed: 03/25/2019 5:29:01 PM By: Daneil Dan,  Bonita QuinLinda RN, BSN Entered By: Zenaida DeedBoehlein, Linda on 03/25/2019 11:59:24 -------------------------------------------------------------------------------- Compression Therapy Details Patient Name: Date of Service: Buelah ManisENLEY, Bridger 03/25/2019 10:30 AM Medical Record [id number] Patient Account Number: [id number] Date of Birth/Sex: 08/27/1950 (68 y.o. Male) Treating RN: Zenaida DeedBoehlein, Linda Primary Care Latacha Texeira: CLINIC, MEDICAL Other Clinician: Referring Keiandre Cygan: Treating Talmage Teaster/Extender:Stone III, Lavonna MonarchHoyt Weaver, Scott Weeks in Treatment: 0 Compression Therapy Performed for Wound Wound #1 Right,Circumferential Lower Leg Assessment: Performed By: Clinician Shawn Stalleaton, Bobbi, RN Compression Type: Three Layer Post Procedure Diagnosis Same as Pre-procedure Electronic Signature(s) Signed: 03/25/2019 5:29:01 PM By: Zenaida DeedBoehlein, Linda RN, BSN Entered By: Zenaida DeedBoehlein, Linda on 03/25/2019 11:52:41 -------------------------------------------------------------------------------- Compression Therapy Details Patient Name: Date of Service: Buelah ManisENLEY, Sulo 03/25/2019 10:30 AM Medical Record [id number] Patient Account Number: [id number] Date of Birth/Sex: 02/14/1951 [age] y.o. Male) Treating RN: Zenaida DeedBoehlein, Linda Primary Care Joniqua Sidle: CLINIC, MEDICAL Other Clinician: Referring Markeya Mincy: Treating Torii Royse/Extender:Stone III, Lavonna MonarchHoyt Weaver, Scott Weeks in Treatment: 0 Compression Therapy Performed for Wound Wound #2 Left,Circumferential Lower Leg Assessment: Performed By: Clinician Shawn Stalleaton, Bobbi, RN Compression Type: Three Layer Post Procedure Diagnosis Same as Pre-procedure Electronic Signature(s) Signed:  03/25/2019 5:29:01 PM By: Zenaida DeedBoehlein, Linda RN, BSN Entered By: Zenaida DeedBoehlein, Linda on 03/25/2019 11:52:41 -------------------------------------------------------------------------------- Encounter Discharge Information Details Patient Name: Date of Service: Buelah ManisENLEY, Amillion 03/25/2019 10:30 AM Medical Record [id number] Patient Account Number: [id number] Date of Birth/Sex: Treating RN: 03/04/1951 [age] y.o. Male) Shawn Stalleaton, Bobbi Primary Care Alizon Schmeling: CLINIC, MEDICAL Other Clinician: Referring Jonathyn Carothers: Treating Soleia Badolato/Extender:Stone III, Lavonna MonarchHoyt Weaver, Scott Weeks in Treatment: 0 Encounter Discharge Information Items Discharge Condition: Stable Ambulatory Status: Ambulatory Discharge Destination: Home Transportation: Private Auto Accompanied By: wife Schedule Follow-up Appointment: Yes Clinical Summary of Care: Electronic Signature(s) Signed: 03/25/2019 5:12:23 PM By: Shawn Stalleaton, Bobbi Entered By: Shawn Stalleaton, Bobbi on 03/25/2019 12:32:20 -------------------------------------------------------------------------------- Lower Extremity Assessment Details Patient Name: Date of Service: Buelah ManisENLEY, Eliga 03/25/2019 10:30 AM Medical Record [id number] Patient Account Number: [id number] Date of Birth/Sex: Treating RN: 01/06/1951 (68 y.o. Male) Yevonne PaxEpps, Carrie Primary Care Triston Skare: CLINIC, MEDICAL Other Clinician: Referring Lota Leamer: Treating Remi Rester/Extender:Stone III, Lavonna MonarchHoyt Weaver, Scott Weeks in Treatment: 0 Edema Assessment Assessed: [Left: Yes] [Right: Yes] Edema: [Left: Yes] [Right: Yes] Calf Left: Right: Point of Measurement: 42 cm From Medial Instep 41 cm 42 cm Ankle Left: Right: Point of Measurement: 10 cm From Medial Instep 29 cm 35 cm Vascular Assessment Blood Pressure: Brachial: [Left:124] [Right:124] Ankle: [Left:Dorsalis Pedis: 148 1.19] [Right:Dorsalis Pedis: 150 1.21] Electronic Signature(s) Signed: 06/09/2019 3:04:22 PM By: Yevonne PaxEpps, Carrie RN Entered By: Yevonne PaxEpps, Carrie on  03/25/2019 11:14:04 -------------------------------------------------------------------------------- Multi-Disciplinary Care Plan Details Patient Name: Date of Service: Buelah ManisENLEY, Dick 03/25/2019 10:30 AM Medical Record [id number] Patient Account Number: [id number] Date of Birth/Sex: Treating RN: 01/11/1951 (68 y.o. Male) Zenaida DeedBoehlein, Linda Primary Care Elsworth Ledin: CLINIC, MEDICAL Other Clinician: Referring Dorien Bessent: Treating Khushbu Pippen/Extender:Stone III, Lavonna MonarchHoyt Weaver, Scott Weeks in Treatment: 0 Active Inactive Nutrition Nursing Diagnoses: Impaired glucose control: actual or potential Potential for alteratiion in Nutrition/Potential for imbalanced nutrition Goals: Patient/caregiver verbalizes understanding of need to maintain therapeutic glucose control per primary care physician Date Initiated: 03/25/2019 Target Resolution Date: 04/22/2019 Goal Status: Active Patient/caregiver will maintain therapeutic glucose control Date Initiated: 03/25/2019 Target Resolution Date: 04/22/2019 Goal Status: Active Interventions: Assess HgA1c results as ordered upon admission and as needed Assess patient nutrition upon admission and as needed per policy Provide education on elevated blood sugars and impact on wound healing Treatment Activities: Patient referred to Primary Care Physician for further nutritional evaluation : 03/25/2019 Notes: Venous Leg Ulcer Nursing Diagnoses: Knowledge deficit related to disease process and management Potential for  venous Insuffiency (use before diagnosis confirmed) Goals: Patient will maintain optimal edema control Date Initiated: 03/25/2019 Target Resolution Date: 04/22/2019 Goal Status: Active Interventions: Assess peripheral edema status every visit. Compression as ordered Treatment Activities: Therapeutic compression applied : 03/25/2019 Notes: Wound/Skin Impairment Nursing Diagnoses: Impaired tissue integrity Knowledge deficit related to  ulceration/compromised skin integrity Goals: Patient/caregiver will verbalize understanding of skin care regimen Date Initiated: 03/25/2019 Target Resolution Date: 04/22/2019 Goal Status: Active Ulcer/skin breakdown will have a volume reduction of 30% by week 4 Date Initiated: 03/25/2019 Target Resolution Date: 04/22/2019 Goal Status: Active Interventions: Assess patient/caregiver ability to obtain necessary supplies Assess patient/caregiver ability to perform ulcer/skin care regimen upon admission and as needed Assess ulceration(s) every visit Treatment Activities: Skin care regimen initiated : 03/25/2019 Topical wound management initiated : 03/25/2019 Notes: Electronic Signature(s) Signed: 03/25/2019 5:29:01 PM By: Zenaida Deed RN, BSN Entered By: Zenaida Deed on 03/25/2019 11:23:35 -------------------------------------------------------------------------------- Pain Assessment Details Patient Name: Date of Service: ERSKIN, ZINDA 03/25/2019 10:30 AM Medical Record [id number] Patient Account Number: [id number] Date of Birth/Sex: Treating RN: [birth year] (68 y.o. Male) Yevonne Pax Primary Care Londyn Hotard: CLINIC, MEDICAL Other Clinician: Referring Shraddha Lebron: Treating Kortlynn Poust/Extender:Stone III, Lavonna Monarch, Scott Weeks in Treatment: 0 Active Problems Location of Pain Severity and Description of Pain Patient Has Paino No Site Locations Pain Management and Medication Current Pain Management: Electronic Signature(s) Signed: 06/09/2019 3:04:22 PM By: Yevonne Pax RN Entered By: Yevonne Pax on 03/25/2019 11:22:22 -------------------------------------------------------------------------------- Patient/Caregiver Education Details Patient Name: [name] 9/23/2020andnbsp10:30 Date of Service: AM Medical Record [id number] Number: Patient Account Number: [id number] Treating RN: Date of Birth/Gender: February 07, 1951 (68 y.o. Zenaida Deed Male) Other Clinician: Primary  Care Treating CLINIC, MEDICAL Lenda Kelp Physician: Physician/Extender: Referring Physician: Lester Lyman in Treatment: 0 Education Assessment Education Provided To: Patient Education Topics Provided Elevated Blood Sugar/ Impact on Healing: Handouts: Elevated Blood Sugars: How Do They Affect Wound Healing Methods: Explain/Verbal Responses: Reinforcements needed, State content correctly Venous: Handouts: Controlling Swelling with Multilayered Compression Wraps Methods: Explain/Verbal, Printed Responses: Reinforcements needed, State content correctly [facility]: Handouts: [facility] Methods: Explain/Verbal, Printed Responses: Reinforcements needed, State content correctly Electronic Signature(s) Signed: 03/25/2019 5:29:01 PM By: Zenaida Deed RN, BSN Entered By: Zenaida Deed on 03/25/2019 11:24:26 -------------------------------------------------------------------------------- Wound Assessment Details Patient Name: Date of Service: SHEENA, SIMONIS 03/25/2019 10:30 AM Medical Record [id number] Patient Account Number: [id number] Date of Birth/Sex: Treating RN: Jul 01, 1951 (68 y.o. Male) Yevonne Pax Primary Care Lamoine Fredricksen: CLINIC, MEDICAL Other Clinician: Referring Amando Ishikawa: Treating Tecia Cinnamon/Extender:Stone III, Lavonna Monarch, Scott Weeks in Treatment: 0 Wound Status Wound Number: 1 Primary Venous Leg Ulcer Etiology: Wound Location: Right Lower Leg - Circumferential Wound Open Wounding Event: Gradually Appeared Status: Date Acquired: 01/31/2019 Comorbid Congestive Heart Failure, Coronary Artery Weeks Of Treatment: 0 History: Disease, Hypertension, Type II Diabetes Clustered Wound: No Photos Wound Measurements Length: (cm) 18 % Reduct Width: (cm) 34 % Reduct Depth: (cm) 0.1 Epitheli Area: (cm) 480.664 Tunneli Volume: (cm) 48.066 Undermi Wound Description Full Thickness Without Exposed Support Foul  Odo Classification: Structures Slough/F Exudate Large Amount: Exudate Serous Type: Exudate amber Color: Wound Bed Granulation Amount: Large (67-100%) Granulation Quality: Pink Fascia E Necrotic Amount: Small (1-33%) Fat Laye Necrotic Quality: Adherent Slough Tendon E Muscle E Joint Ex Bone Exp r After Cleansing: No ibrino Yes Exposed Structure xposed: No r (Subcutaneous Tissue) Exposed: Yes xposed: No xposed: No posed: No osed: No ion in Area: 0% ion in Volume: 0% alization: None ng: No ning: No Electronic  Signature(s) Signed: 03/27/2019 8:42:43 AM By: Mikeal Hawthorne EMT/HBOT Signed: 06/09/2019 3:04:22 PM By: Carlene Coria RN Entered By: Mikeal Hawthorne on 03/26/2019 08:58:48 -------------------------------------------------------------------------------- Wound Assessment Details Patient Name: Date of Service: KENTARIUS, PARTINGTON 03/25/2019 10:30 AM Medical Record [id number] Patient Account Number: [id number] Date of Birth/Sex: Treating RN: 06/28/1951 (68 y.o. Male) Carlene Coria Primary Care Kairee Kozma: CLINIC, MEDICAL Other Clinician: Referring Burke Terry: Treating Chyler Creely/Extender:Stone III, Prudencio Burly, Scott Weeks in Treatment: 0 Wound Status Wound Number: 2 Primary Venous Leg Ulcer Etiology: Wound Location: Left Lower Leg - Circumferential Wound Open Wounding Event: Gradually Appeared Status: Date Acquired: 01/31/2019 Comorbid Congestive Heart Failure, Coronary Artery Weeks Of Treatment: 0 History: Disease, Hypertension, Type II Diabetes Clustered Wound: No Photos Wound Measurements Length: (cm) 15 % Reduct Width: (cm) 29 % Reduct Depth: (cm) 0.1 Epitheli Area: (cm) 341.648 Tunneli Volume: (cm) 34.165 Undermi Wound Description Classification: Full Thickness Without Exposed Support Foul Odo Structures Slough/F Exudate Large Amount: Exudate Serous Type: Exudate amber Color: Wound Bed Granulation Amount: Large (67-100%) Granulation Quality: Pink  Fascia E Necrotic Amount: Small (1-33%) Fat Laye Necrotic Quality: Adherent Slough Tendon E Muscle E Joint Ex Bone Exp r After Cleansing: No ibrino Yes Exposed Structure xposed: No r (Subcutaneous Tissue) Exposed: Yes xposed: No xposed: No posed: No osed: No ion in Area: 0% ion in Volume: 0% alization: None ng: No ning: No Electronic Signature(s) Signed: 03/27/2019 8:42:43 AM By: Mikeal Hawthorne EMT/HBOT Signed: 06/09/2019 3:04:22 PM By: Carlene Coria RN Entered By: Mikeal Hawthorne on 03/26/2019 09:01:32 -------------------------------------------------------------------------------- Wound Assessment Details Patient Name: Date of Service: OSA, CAMPOLI 03/25/2019 10:30 AM Medical Record [id number] Patient Account Number: [id number] Date of Birth/Sex: Treating RN: 01-08-1951 (68 y.o. Male) Carlene Coria Primary Care Cristiano Capri: CLINIC, MEDICAL Other Clinician: Referring Magaly Pollina: Treating Malissia Rabbani/Extender:Stone III, Prudencio Burly, Scott Weeks in Treatment: 0 Wound Status Wound Number: 3 Primary Diabetic Wound/Ulcer of the Lower Extremity Etiology: Wound Location: Right Foot - Dorsal Wound Open Wounding Event: Gradually Appeared Status: Date Acquired: 01/31/2019 Comorbid Congestive Heart Failure, Coronary Artery Weeks Of Treatment: 0 History: Disease, Hypertension, Type II Diabetes Clustered Wound: No Photos Wound Measurements Length: (cm) 2.5 Width: (cm) 6 Depth: (cm) 0.1 Area: (cm) 11.781 Volume: (cm) 1.178 Wound Description Classification: Grade 2 Exudate Amount: Large Exudate Type: Serous Exudate Color: amber Wound Bed Granulation Amount: Large (67-100%) Granulation Quality: Pink Necrotic Amount: Small (1-33%) Necrotic Quality: Adherent Slough er Cleansing: No o Yes Exposed Structure d: No bcutaneous Tissue) Exposed: Yes d: No d: No : No No % Reduction in Area: 0% % Reduction in Volume: 0% Epithelialization: None Tunneling: No Undermining:  No Foul Odor Aft Slough/Fibrin Fascia Expose Fat Layer (Su Tendon Expose Muscle Expose Joint Exposed Bone Exposed: Electronic Signature(s) Signed: 03/27/2019 8:42:43 AM By: Mikeal Hawthorne EMT/HBOT Signed: 06/09/2019 3:04:22 PM By: Carlene Coria RN Entered By: Mikeal Hawthorne on 03/26/2019 08:59:54 -------------------------------------------------------------------------------- Wound Assessment Details Patient Name: Date of Service: SILVER, PARKEY 03/25/2019 10:30 AM Medical Record [id number] Patient Account Number: [id number] Date of Birth/Sex: Treating RN: September 29, 1950 (67 y.o. Male) Epps, Morey Hummingbird Primary Care Darrian Goodwill: CLINIC, MEDICAL Other Clinician: Referring Aldous Housel: Treating Shalamar Crays/Extender:Stone III, Prudencio Burly, Scott Weeks in Treatment: 0 Wound Status Wound Number: 4 Primary Diabetic Wound/Ulcer of the Lower Extremity Etiology: Wound Location: Left Foot - Dorsal Wound Open Wounding Event: Gradually Appeared Status: Date Acquired: 01/31/2019 Comorbid Congestive Heart Failure, Coronary Artery Weeks Of Treatment: 0 History: Disease, Hypertension, Type II Diabetes Clustered Wound: No Photos Wound Measurements Length: (cm) 1.7 % Reduction i Width: (cm) 3 % Reduction i  Depth: (cm) 0.1 Epithelializa Area: (cm) 4.006 Tunneling: Volume: (cm) 0.401 Undermining: Wound Description Classification: Grade 2 Foul Odor Aft Exudate Amount: Large Slough/Fibrin Exudate Type: Serous Exudate Color: amber Wound Bed Granulation Amount: Large (67-100%) Granulation Quality: Pink Fascia Expose Necrotic Amount: Small (1-33%) Fat Layer (Su Necrotic Quality: Adherent Slough Tendon Expose Muscle Expose Joint Exposed Bone Exposed: Electronic Signature(s) Signed: 03/27/2019 8:42:43 AM By: Benjaman Kindler EMT/HBOT Signed: 06/09/2019 3:04:22 PM By: Yevonne Pax RN Entered By: Benjaman Kindler on 03/26/19 er Cleansing: No o Yes Exposed Structure d: No bcutaneous Tissue) Exposed:  Yes d: No d: No : No No 20 09:01:53 n Area: 0% n Volume: 0% tion: None No No -------------------------------------------------------------------------------- Wound Assessment Details Patient Name: Date of Service: MAKO, PELFREY 03/25/2019 10:30 AM Medical Record [id number] Patient Account Number: [id number] Date of Birth/Sex: Treating RN: 08-Apr-1951 (68 y.o. Male) Yevonne Pax Primary Care Michaeljames Milnes: CLINIC, MEDICAL Other Clinician: Referring Choua Chalker: Treating Emigdio Wildeman/Extender:Stone III, Lavonna Monarch, Scott Weeks in Treatment: 0 Wound Status Wound Number: 5 Primary Diabetic Wound/Ulcer of the Lower Extremity Etiology: Wound Location: Left Toe Great Wound Open Wounding Event: Gradually Appeared Status: Date Acquired: 01/31/2019 Comorbid Congestive Heart Failure, Coronary Artery Weeks Of Treatment: 0 History: Disease, Hypertension, Type II Diabetes Clustered Wound: No Photos Wound Measurements Length: (cm) 0.6 Width: (cm) 0.7 Depth: (cm) 0.1 Area: (cm) 0.33 Volume: (cm) 0.033 Wound Description Classification: Grade 2 Exudate Amount: Large Exudate Type: Serous Exudate Color: amber Wound Bed Granulation Amount: Large (67-100%) Granulation Quality: Pink Necrotic Amount: Small (1-33%) Necrotic Quality: Adherent Slough er Cleansing: No o Yes Exposed Structure d: No bcutaneous Tissue) Exposed: Yes d: No d: No ed: No d: No % Reduction in Area: 0% % Reduction in Volume: 0% Epithelialization: None Tunneling: No Undermining: No Foul Odor Aft Slough/Fibrin Fascia Expose Fat Layer (Su Tendon Expose Muscle Expose Joint Expos Bone Expose Electronic Signature(s) Signed: 03/27/2019 8:42:43 AM By: Benjaman Kindler EMT/HBOT Signed: 06/09/2019 3:04:22 PM By: Yevonne Pax RN Entered By: Benjaman Kindler on 03/26/2019 09:08:59 -------------------------------------------------------------------------------- Vitals Details Patient Name: Date of Service: GURDEEP, KEESEY 03/25/2019 10:30 AM Medical Record [id number] Patient Account Number: [id number] Date of Birth/Sex: Treating RN: January 15, 1951 (68 y.o. Male) Epps, Lyla Son Primary Care Oswell Say: CLINIC, MEDICAL Other Clinician: Referring Talena Neira: Treating Rhyan Wolters/Extender:Stone III, Lavonna Monarch, Scott Weeks in Treatment: 0 Vital Signs Time Taken: 10:44 Temperature (F): 98.4 Height (in): 67 Pulse (bpm): 75 Source: Stated Respiratory Rate (breaths/min): 20 Weight (lbs): 222 Blood Pressure (mmHg): 124/78 Source: Stated Reference Range: 80 - 120 mg / dl Body Mass Index (BMI): 34.8 Electronic Signature(s) Signed: 03/25/2019 5:29:01 PM By: Zenaida Deed RN, BSN Entered By: Zenaida Deed on 03/25/2019 13:30:15

## 2019-06-09 NOTE — Progress Notes (Signed)
[name] ([id number]) Visit Report for 03/25/2019 Abuse/Suicide Risk Screen Details Patient Name: Date of Service: [name], [name] 03/25/2019 10:30 AM Medical Record [id number] Patient Account Number: [id number] Date of Birth/Sex: Treating RN: [birth year] (68 y.o. Male) Yevonne Pax Primary Care Viola Kinnick: CLINIC, MEDICAL Other Clinician: Referring Clarkson Rosselli: Treating Aren Cherne/Extender:Stone III, Lavonna Monarch, Scott Weeks in Treatment: 0 Abuse/Suicide Risk Screen Items Answer ABUSE RISK SCREEN: Has anyone close to you tried to hurt or harm you recentlyo No Do you feel uncomfortable with anyone in your familyo No Has anyone forced you do things that you didnt want to doo No Electronic Signature(s) Signed: 06/09/2019 3:04:22 PM By: Yevonne Pax RN Entered By: Yevonne Pax on 03/25/2019 10:54:12 -------------------------------------------------------------------------------- Activities of Daily Living Details Patient Name: Date of Service: [name] 03/25/2019 10:30 AM Medical Record [id number] Patient Account Number: [id number] Date of Birth/Sex: Treating RN: December 03, 1950 (68 y.o. Male) Yevonne Pax Primary Care Dione Petron: CLINIC, MEDICAL Other Clinician: Referring Darreld Hoffer: Treating Brooklynn Brandenburg/Extender:Stone III, Lavonna Monarch, Scott Weeks in Treatment: 0 Activities of Daily Living Items Answer Activities of Daily Living (Please select one for each item) Drive Automobile Not Able Take Medications Need Assistance Use Telephone Completely Able Care for Appearance Need Assistance Use Toilet Completely Able Bath / Shower Need Assistance Dress Self Need Assistance Feed Self Completely Able Walk Completely Able Get In / Out Bed Need Assistance Housework Not Able Prepare Meals Not Able Handle Money Need Assistance Shop for Self Not Able Electronic Signature(s) Signed: 06/09/2019 3:04:22 PM By: Yevonne Pax RN Entered By: Yevonne Pax on 03/25/2019  10:55:34 -------------------------------------------------------------------------------- Education Screening Details Patient Name: Date of Service: [name] 03/25/2019 10:30 AM Medical Record [id number] Patient Account Number: [id number] Date of Birth/Sex: Treating RN: September 03, 1950 (68 y.o. Male) Yevonne Pax Primary Care Markeria Goetsch: CLINIC, MEDICAL Other Clinician: Referring Megha Agnes: Treating Kyndall Amero/Extender:Stone III, Lavonna Monarch, Scott Weeks in Treatment: 0 Primary Learner Assessed: Patient Learning Preferences/Education Level/Primary Language Learning Preference: Explanation Highest Education Level: High School Preferred Language: English Cognitive Barrier Language Barrier: No Translator Needed: No Memory Deficit: No Emotional Barrier: No Cultural/Religious Beliefs Affecting Medical Care: No Physical Barrier Impaired Vision: Yes Legally Blind Impaired Hearing: No Decreased Hand dexterity: No Knowledge/Comprehension Knowledge Level: Medium Comprehension Level: Medium Ability to understand written Medium instructions: Ability to understand verbal Medium instructions: Motivation Anxiety Level: Anxious Cooperation: Cooperative Education Importance: Acknowledges Need Interest in Health Problems: Asks Questions Perception: Confused Willingness to Engage in Self- Medium Management Activities: Readiness to Engage in Self- Medium Management Activities: Electronic Signature(s) Signed: 06/09/2019 3:04:22 PM By: Yevonne Pax RN Entered By: Yevonne Pax on 03/25/2019 10:56:28 -------------------------------------------------------------------------------- Fall Risk Assessment Details Patient Name: Date of Service: [name] 03/25/2019 10:30 AM Medical Record [id number] Patient Account Number: [id number] Date of Birth/Sex: Treating RN: 1950/12/29 (68 y.o. Male) Yevonne Pax Primary Care Talyah Seder: CLINIC, MEDICAL Other Clinician: Referring Rosamary Boudreau:  Treating Claretha Townshend/Extender:Stone III, Lavonna Monarch, Scott Weeks in Treatment: 0 Fall Risk Assessment Items Have you had 2 or more falls in the last 12 monthso 0 No Have you had any fall that resulted in injury in the last 12 monthso 0 No FALLS RISK SCREEN History of falling - immediate or within 3 months 0 No Secondary diagnosis (Do you have 2 or more medical diagnoseso) 0 No Ambulatory aid None/bed rest/wheelchair/nurse 0 No Crutches/cane/walker 0 No Furniture 0 No Intravenous therapy Access/Saline/Heparin Lock 0 No Weak (short steps with or without shuffle, stooped but able to lift head 0 No while walking, may seek support from furniture) Impaired (short steps with shuffle, may have  difficulty arising from chair, 0 No head down, impaired balance) Mental Status Oriented to own ability 0 No Overestimates or forgets limitations 0 No Risk Level: Low Risk Score: 0 Electronic Signature(s) Signed: 06/09/2019 3:04:22 PM By: Carlene Coria RN Entered By: Carlene Coria on 03/25/2019 10:56:41 -------------------------------------------------------------------------------- Foot Assessment Details Patient Name: Date of Service: [name] 03/25/2019 10:30 AM Medical Record [id number] Patient Account Number: [id number] Date of Birth/Sex: Treating RN: 04-28-1951 (68 y.o. Male) Carlene Coria Primary Care Jami Bogdanski: CLINIC, MEDICAL Other Clinician: Referring Amarie Tarte: Treating Sagal Gayton/Extender:Stone III, Prudencio Burly, Scott Weeks in Treatment: 0 Foot Assessment Items Site Locations + = Sensation present, - = Sensation absent, C = Callus, U = Ulcer R = Redness, W = Warmth, M = Maceration, PU = Pre-ulcerative lesion F = Fissure, S = Swelling, D = Dryness Assessment Right: Left: Other Deformity: No No Prior Foot Ulcer: No No Prior Amputation: No No Charcot Joint: No No Ambulatory Status: Ambulatory Without Help Gait: Steady Electronic Signature(s) Signed: 06/09/2019 3:04:22 PM By:  Carlene Coria RN Entered By: Carlene Coria on 03/25/2019 10:59:13 -------------------------------------------------------------------------------- Nutrition Risk Screening Details Patient Name: Date of Service: TREYVON, BLAHUT 03/25/2019 10:30 AM Medical Record [id number] Patient Account Number: [id number] Date of Birth/Sex: Treating RN: 1951/04/03 (68 y.o. Male) Carlene Coria Primary Care [place] Pelto: CLINIC, MEDICAL Other Clinician: Referring Melesa Lecy: Treating Jayjay Littles/Extender:Stone III, Prudencio Burly, Scott Weeks in Treatment: 0 Height (in): 67 Weight (lbs): 222 Body Mass Index (BMI): 34.8 Nutrition Risk Screening Items Score Screening NUTRITION RISK SCREEN: I have an illness or condition that made me change the kind and/or 0 No amount of food I eat I eat fewer than two meals per day 0 No I eat few fruits and vegetables, or milk products 0 No I have three or more drinks of beer, liquor or wine almost every day 0 No I have tooth or mouth problems that make it hard for me to eat 0 No I don't always have enough money to buy the food I need 0 No I eat alone most of the time 0 No I take three or more different prescribed or over-the-counter drugs a day 1 Yes 0 No Without wanting to, I have lost or gained 10 pounds in the last six months I am not always physically able to shop, cook and/or feed myself 2 Yes Nutrition Protocols Good Risk Protocol Provide education on Moderate Risk Protocol 0 nutrition High Risk Proctocol Risk Level: Moderate Risk Score: 3 Electronic Signature(s) Signed: 06/09/2019 3:04:22 PM By: Carlene Coria RN Entered By: Carlene Coria on 03/25/2019 10:57:12

## 2019-06-10 ENCOUNTER — Other Ambulatory Visit (HOSPITAL_COMMUNITY): Payer: Self-pay

## 2019-06-10 NOTE — Progress Notes (Signed)
Paramedicine Encounter    Patient ID: [name], male    DOB: 02-25-1951, 68 y.o.   MRN: [id number]    Patient Care Team: Clinic, Medical, MD (Inactive) as PCP - Cyndia Diver, MD as PCP - Cardiology (Cardiology)  Patient Active Problem List   Diagnosis Date Noted  . Hallux limitus of left foot 06/05/2019  . Hallux limitus of right foot 06/05/2019  . Seasonal allergies 02/12/2019  . Health care maintenance 02/12/2019  . GERD (gastroesophageal reflux disease) 02/12/2019  . Nocturnal hypoxemia 02/12/2019  . OSA (obstructive sleep apnea) 02/12/2019  . Pain due to onychomycosis of toenails of both feet 01/30/2019  . Lobar pneumonia ([place]) 12/29/2018  . Diabetes mellitus type 2, controlled, without complications ([place]) 24/26/8341  . Thoracic aortic aneurysm ([facility]) 12/29/2018  . Acute on chronic combined systolic and diastolic CHF (congestive heart failure) ([place]) 12/25/2018  . Syncope 12/25/2018  . Chronic systolic heart failure ([place]) 11/19/2018  . Chronic venous insufficiency 06/24/2018  . Respiratory failure ([place]) 06/01/2018  . Leg swelling 05/06/2018  . Acute respiratory failure with hypoxemia ([place]) 10/14/2017  . Influenza-like illness 10/14/2017  . Delirium 10/14/2017  . Acute urinary retention 10/14/2017  . (HFpEF) heart failure with preserved ejection fraction ([place]), pulmonary edema and LBBB of unknown chronicity  10/29/2016  . Elevated troponin I level, presume demand ischemia  10/29/2016  . AKI (acute kidney injury) ([place]) 10/29/2016  . BPH (benign prostatic hyperplasia) 10/29/2016  . Sepsis ([place]) 10/29/2016  . Bacteremia due to Escherichia coli 10/29/2016  . Fatty liver 10/29/2016  . Acute pulmonary edema (HCC)   . Acute hypoxic and hypercarbic respiratory failure (HCC) in setting of bilateral pulmonary infiltrates. Presume CAP + pulmonary edema +/-ALI 10/27/2016  . Left-sided weakness 05/04/2015  . TIA (transient ischemic attack) : Rule out. 05/04/2015  .  Hyperlipidemia 05/04/2015  . Chest pain 10/10/2013  . SOB (shortness of breath) 10/10/2013  . Hyperglycemia without ketosis 10/17/2011  . HTN (hypertension) 10/17/2011  . Foot drop, left 10/17/2011  . Impacted cerumen of right ear 10/17/2011  . Dyspnea 10/17/2011  . History of TIA (transient ischemic attack) 10/17/2011  . Neuropathy ([place]) 10/17/2011  . Aortic insufficiency 03/16/2011  . Chest pain 03/16/2011    Current Outpatient Medications:  .  aspirin EC 81 MG tablet, Take 81 mg by mouth daily., Disp: , Rfl:  .  atorvastatin (LIPITOR) 40 MG tablet, Take 1 tablet (40 mg total) by mouth every evening., Disp: 30 tablet, Rfl: 0 .  carvedilol (COREG) 6.25 MG tablet, Take 1.5 tablets (9.375 mg total) by mouth 2 (two) times daily., Disp: 180 tablet, Rfl: 3 .  clopidogrel (PLAVIX) 75 MG tablet, Take 1 tablet (75 mg total) by mouth daily., Disp: 30 tablet, Rfl: 0 .  finasteride (PROSCAR) 5 MG tablet, Take 5 mg by mouth daily., Disp: , Rfl:  .  gabapentin (NEURONTIN) 300 MG capsule, Take 300 mg by mouth 4 (four) times daily. , Disp: , Rfl:  .  hydrOXYzine (VISTARIL) 50 MG capsule, Take 50 mg by mouth 3 (three) times daily., Disp: , Rfl:  .  metFORMIN (GLUCOPHAGE) 1000 MG tablet, Take 1,000 mg by mouth 2 (two) times a day. , Disp: , Rfl:  .  Multiple Vitamin (MULTIVITAMIN) tablet, Take 1 tablet by mouth daily., Disp: , Rfl:  .  pantoprazole (PROTONIX) 40 MG tablet, Take 1 tablet (40 mg total) by mouth daily., Disp: 30 tablet, Rfl: 1 .  potassium chloride SA (KLOR-CON) 20 MEQ tablet, Take 20 mEq  by mouth daily. , Disp: , Rfl:  .  tamsulosin (FLOMAX) 0.4 MG CAPS capsule, Take 0.4 mg by mouth daily., Disp: , Rfl:  .  torsemide (DEMADEX) 20 MG tablet, Take 2 tablets (40 mg total) by mouth every morning AND 1 tablet (20 mg total) every evening., Disp: 90 tablet, Rfl: 6 .  Accu-Chek FastClix Lancets MISC, USE UTD, Disp: , Rfl:  .  ACCU-CHEK GUIDE test strip, U TO TEST BLOOD SUGAR QD, Disp: , Rfl:  .   acetaminophen (TYLENOL) 325 MG tablet, Take 650 mg by mouth every 6 (six) hours as needed for moderate pain or headache., Disp: , Rfl:  .  ARTIFICIAL TEAR SOLUTION OP, Place 1 drop into both eyes daily as needed (dry eyes)., Disp: , Rfl:  .  Blood Glucose Monitoring Suppl (ACCU-CHEK GUIDE) w/Device KIT, U TO TEST BLOOD SUGAR QD, Disp: , Rfl:  .  calcium carbonate (TUMS - DOSED IN MG ELEMENTAL CALCIUM) 500 MG chewable tablet, Chew 1 tablet by mouth daily as needed for indigestion or heartburn., Disp: , Rfl:  .  Dextromethorphan HBr (ROBITUSSIN LINGERING COUGHGELS) 15 MG CAPS, Take 15 mg by mouth daily as needed (cough)., Disp: , Rfl:  .  montelukast (SINGULAIR) 10 MG tablet, Take 10 mg by mouth daily. , Disp: , Rfl:  .  phenylephrine (SUDAFED PE) 10 MG TABS tablet, Take 10 mg by mouth every 4 (four) hours as needed (congestion)., Disp: , Rfl:  Allergies  Allergen Reactions  . Cashew Nut Oil Palpitations  . Percocet [Oxycodone-Acetaminophen] Other (See Comments)    Hallucintaions     Social History   Socioeconomic History  . Marital status: Married    Spouse name: Not on file  . Number of children: Not on file  . Years of education: Not on file  . Highest education level: Not on file  Occupational History  . Not on file  Tobacco Use  . Smoking status: Never Smoker  . Smokeless tobacco: Never Used  Substance and Sexual Activity  . Alcohol use: No  . Drug use: No  . Sexual activity: Not Currently    Partners: Female    Birth control/protection: None  Other Topics Concern  . Not on file  Social History Narrative   Patient lives in private residence with supportive spouse   Social Determinants of Health   Financial Resource Strain: [place]   . Difficulty of Paying Living Expenses: Not very hard  Food Insecurity: No Food Insecurity  . Worried About Charity fundraiser in the Last Year: Never true  . Ran Out of Food in the Last Year: Never true  Transportation Needs: Unknown   . Lack of Transportation (Medical): Patient refused  . Lack of Transportation (Non-Medical): Patient refused  Physical Activity: Inactive  . Days of Exercise per Week: 0 days  . Minutes of Exercise per Session: 0 min  Stress: No Stress Concern Present  . Feeling of Stress : Not at all  Social Connections: Unknown  . Frequency of Communication with Friends and Family: Patient refused  . Frequency of Social Gatherings with Friends and Family: Patient refused  . Attends Religious Services: Patient refused  . Active Member of Clubs or Organizations: Patient refused  . Attends Archivist Meetings: Patient refused  . Marital Status: Patient refused  Intimate Partner Violence: Not At Risk  . Fear of Current or Ex-Partner: No  . Emotionally Abused: No  . Physically Abused: No  . Sexually Abused: No  Physical Exam Pulmonary:     Effort: No respiratory distress.     Breath sounds: No wheezing or rales.  Musculoskeletal:        General: No swelling.     Right lower leg: No edema.     Left lower leg: No edema.  Skin:    General: Skin is warm and dry.         Future Appointments  Date Time Provider [place]  06/15/2019 12:05 PM [facility]ENING [facility] None  06/16/2019 10:30 AM Velora Heckler [facility]  06/17/2019  9:00 AM [facility] LAB [facility] None  06/17/2019  8:00 PM Sueanne Margarita, MD [facility]  06/24/2019 10:15 AM Richardson Dopp T, PA-C [facility]  09/01/2019  1:40 PM Larey Dresser, MD [facility] None  09/04/2019  9:30 AM Gardiner Barefoot, DPM [facility]     BP 110/80 (BP Location: Right Arm, Patient Position: Sitting, Cuff Size: Normal)   Pulse 86   Temp 98 F (36.7 C)   Wt 206 lb (93.4 kg)   BMI 32.26 kg/m  CBG 189  Weight yesterday-207 Last visit weight-212  ATF pt CAO x4; pt has no complaints. He said that he feels better and is sleeping without difficulty.  He does have an appointment to get his CPAP.  He's  taken all of his medications for the past week including this morning.  He denies sob, chest pain and dizziness.    His wife is still putting compression staking's throughout the day.  No swelling noted to his feet and legs.  He's blood sugars has been within normal limits per his wife.  rx bottles verified and pill box refilled.    Medication ordered: His wife said that she'll call them in next week because it's too early to fill   metforim Gabapentin [place] Lashanti Chambless, EMT Paramedic [phone] 06/11/2019    ACTION: Home visit completed

## 2019-06-15 ENCOUNTER — Other Ambulatory Visit (HOSPITAL_COMMUNITY)
Admission: RE | Admit: 2019-06-15 | Discharge: 2019-06-15 | Disposition: A | Discharge status: Home / Self Care | Payer: Medicare Other | Source: Ambulatory Visit | Attending: Cardiology | Admitting: Cardiology

## 2019-06-15 DIAGNOSIS — Z01812 Encounter for preprocedural laboratory examination: Secondary | ICD-10-CM | POA: Insufficient documentation

## 2019-06-15 DIAGNOSIS — Z20828 Contact with and (suspected) exposure to other viral communicable diseases: Secondary | ICD-10-CM | POA: Insufficient documentation

## 2019-06-15 LAB — SARS CORONAVIRUS 2 (TAT 6-24 HRS): SARS Coronavirus 2: NEGATIVE

## 2019-06-16 ENCOUNTER — Ambulatory Visit: Payer: Medicare Other | Admitting: Orthotics

## 2019-06-16 ENCOUNTER — Other Ambulatory Visit: Payer: Self-pay

## 2019-06-16 DIAGNOSIS — M205X2 Other deformities of toe(s) (acquired), left foot: Secondary | ICD-10-CM

## 2019-06-16 DIAGNOSIS — M205X1 Other deformities of toe(s) (acquired), right foot: Secondary | ICD-10-CM

## 2019-06-16 NOTE — Progress Notes (Signed)
Being seen by NP 

## 2019-06-17 ENCOUNTER — Ambulatory Visit (HOSPITAL_COMMUNITY)
Admission: RE | Admit: 2019-06-17 | Discharge: 2019-06-17 | Disposition: A | Discharge status: Home / Self Care | Payer: Medicare Other | Source: Ambulatory Visit | Attending: Internal Medicine | Admitting: Internal Medicine

## 2019-06-17 ENCOUNTER — Ambulatory Visit (HOSPITAL_BASED_OUTPATIENT_CLINIC_OR_DEPARTMENT_OTHER): Payer: Medicare Other | Admitting: Cardiology

## 2019-06-17 ENCOUNTER — Other Ambulatory Visit: Payer: Self-pay

## 2019-06-17 ENCOUNTER — Other Ambulatory Visit (HOSPITAL_COMMUNITY): Payer: Self-pay

## 2019-06-17 DIAGNOSIS — G4733 Obstructive sleep apnea (adult) (pediatric): Secondary | ICD-10-CM

## 2019-06-17 DIAGNOSIS — I5022 Chronic systolic (congestive) heart failure: Secondary | ICD-10-CM | POA: Insufficient documentation

## 2019-06-17 LAB — BASIC METABOLIC PANEL
Anion gap: 12 (ref 5–15)
BUN: 28 mg/dL — ABNORMAL HIGH (ref 8–23)
CO2: 32 mmol/L (ref 22–32)
Calcium: 8.2 mg/dL — ABNORMAL LOW (ref 8.9–10.3)
Chloride: 98 mmol/L (ref 98–111)
Creatinine, Ser: 1.51 mg/dL — ABNORMAL HIGH (ref 0.61–1.24)
GFR calc Af Amer: 54 mL/min — ABNORMAL LOW (ref >60)
GFR calc non Af Amer: 46 mL/min — ABNORMAL LOW (ref >60)
Glucose, Bld: 199 mg/dL — ABNORMAL HIGH (ref 70–99)
Potassium: 4.1 mmol/L (ref 3.5–5.1)
Sodium: 142 mmol/L (ref 135–145)

## 2019-06-17 NOTE — Progress Notes (Signed)
Paramedicine Encounter    Patient ID: [name], male    DOB: April 03, 1951, 68 y.o.   MRN: [id number]    Patient Care Team: Clinic, Medical, MD (Inactive) as PCP - Cyndia Diver, MD as PCP - Cardiology (Cardiology)  Patient Active Problem List   Diagnosis Date Noted  . Hallux limitus of left foot 06/05/2019  . Hallux limitus of right foot 06/05/2019  . Seasonal allergies 02/12/2019  . Health care maintenance 02/12/2019  . GERD (gastroesophageal reflux disease) 02/12/2019  . Nocturnal hypoxemia 02/12/2019  . OSA (obstructive sleep apnea) 02/12/2019  . Pain due to onychomycosis of toenails of both feet 01/30/2019  . Lobar pneumonia ([place]) 12/29/2018  . Diabetes mellitus type 2, controlled, without complications ([place]) 00/76/2263  . Thoracic aortic aneurysm ([place]) 12/29/2018  . Acute on chronic combined systolic and diastolic CHF (congestive heart failure) ([place]) 12/25/2018  . Syncope 12/25/2018  . Chronic systolic heart failure ([place]) 11/19/2018  . Chronic venous insufficiency 06/24/2018  . Respiratory failure ([place]) 06/01/2018  . Leg swelling 05/06/2018  . Acute respiratory failure with hypoxemia ([place]) 10/14/2017  . Influenza-like illness 10/14/2017  . Delirium 10/14/2017  . Acute urinary retention 10/14/2017  . (HFpEF) heart failure with preserved ejection fraction ([place]), pulmonary edema and LBBB of unknown chronicity  10/29/2016  . Elevated troponin I level, presume demand ischemia  10/29/2016  . AKI (acute kidney injury) ([place]) 10/29/2016  . BPH (benign prostatic hyperplasia) 10/29/2016  . Sepsis ([place]) 10/29/2016  . Bacteremia due to Escherichia coli 10/29/2016  . Fatty liver 10/29/2016  . Acute pulmonary edema (HCC)   . Acute hypoxic and hypercarbic respiratory failure (HCC) in setting of bilateral pulmonary infiltrates. Presume CAP + pulmonary edema +/-ALI 10/27/2016  . Left-sided weakness 05/04/2015  . TIA (transient ischemic attack) : Rule out. 05/04/2015  .  Hyperlipidemia 05/04/2015  . Chest pain 10/10/2013  . SOB (shortness of breath) 10/10/2013  . Hyperglycemia without ketosis 10/17/2011  . HTN (hypertension) 10/17/2011  . Foot drop, left 10/17/2011  . Impacted cerumen of right ear 10/17/2011  . Dyspnea 10/17/2011  . History of TIA (transient ischemic attack) 10/17/2011  . Neuropathy ([place]) 10/17/2011  . Aortic insufficiency 03/16/2011  . Chest pain 03/16/2011    Current Outpatient Medications:  .  aspirin EC 81 MG tablet, Take 81 mg by mouth daily., Disp: , Rfl:  .  atorvastatin (LIPITOR) 40 MG tablet, Take 1 tablet (40 mg total) by mouth every evening., Disp: 30 tablet, Rfl: 0 .  clopidogrel (PLAVIX) 75 MG tablet, Take 1 tablet (75 mg total) by mouth daily., Disp: 30 tablet, Rfl: 0 .  finasteride (PROSCAR) 5 MG tablet, Take 5 mg by mouth daily., Disp: , Rfl:  .  gabapentin (NEURONTIN) 300 MG capsule, Take 300 mg by mouth 4 (four) times daily. , Disp: , Rfl:  .  hydrOXYzine (VISTARIL) 50 MG capsule, Take 50 mg by mouth 3 (three) times daily., Disp: , Rfl:  .  metFORMIN (GLUCOPHAGE) 1000 MG tablet, Take 1,000 mg by mouth 2 (two) times a day. , Disp: , Rfl:  .  Multiple Vitamin (MULTIVITAMIN) tablet, Take 1 tablet by mouth daily., Disp: , Rfl:  .  pantoprazole (PROTONIX) 40 MG tablet, Take 1 tablet (40 mg total) by mouth daily., Disp: 30 tablet, Rfl: 1 .  potassium chloride SA (KLOR-CON) 20 MEQ tablet, Take 20 mEq by mouth daily. , Disp: , Rfl:  .  tamsulosin (FLOMAX) 0.4 MG CAPS capsule, Take 0.4 mg by mouth daily., Disp: ,  Rfl:  .  torsemide (DEMADEX) 20 MG tablet, Take 2 tablets (40 mg total) by mouth every morning AND 1 tablet (20 mg total) every evening., Disp: 90 tablet, Rfl: 6 .  Accu-Chek FastClix Lancets MISC, USE UTD, Disp: , Rfl:  .  ACCU-CHEK GUIDE test strip, U TO TEST BLOOD SUGAR QD, Disp: , Rfl:  .  acetaminophen (TYLENOL) 325 MG tablet, Take 650 mg by mouth every 6 (six) hours as needed for moderate pain or headache., Disp:  , Rfl:  .  ARTIFICIAL TEAR SOLUTION OP, Place 1 drop into both eyes daily as needed (dry eyes)., Disp: , Rfl:  .  Blood Glucose Monitoring Suppl (ACCU-CHEK GUIDE) w/Device KIT, U TO TEST BLOOD SUGAR QD, Disp: , Rfl:  .  calcium carbonate (TUMS - DOSED IN MG ELEMENTAL CALCIUM) 500 MG chewable tablet, Chew 1 tablet by mouth daily as needed for indigestion or heartburn., Disp: , Rfl:  .  carvedilol (COREG) 6.25 MG tablet, Take 1.5 tablets (9.375 mg total) by mouth 2 (two) times daily., Disp: 180 tablet, Rfl: 3 .  Dextromethorphan HBr (ROBITUSSIN LINGERING COUGHGELS) 15 MG CAPS, Take 15 mg by mouth daily as needed (cough)., Disp: , Rfl:  .  montelukast (SINGULAIR) 10 MG tablet, Take 10 mg by mouth daily. , Disp: , Rfl:  .  phenylephrine (SUDAFED PE) 10 MG TABS tablet, Take 10 mg by mouth every 4 (four) hours as needed (congestion)., Disp: , Rfl:  Allergies  Allergen Reactions  . Cashew Nut Oil Palpitations  . Percocet [Oxycodone-Acetaminophen] Other (See Comments)    Hallucintaions     Social History   Socioeconomic History  . Marital status: Married    Spouse name: Not on file  . Number of children: Not on file  . Years of education: Not on file  . Highest education level: Not on file  Occupational History  . Not on file  Tobacco Use  . Smoking status: Never Smoker  . Smokeless tobacco: Never Used  Substance and Sexual Activity  . Alcohol use: No  . Drug use: No  . Sexual activity: Not Currently    Partners: Female    Birth control/protection: None  Other Topics Concern  . Not on file  Social History Narrative   Patient lives in private residence with supportive spouse   Social Determinants of Health   Financial Resource Strain:   . Difficulty of Paying Living Expenses: Not on file  Food Insecurity:   . Worried About Charity fundraiser in the Last Year: Not on file  . Ran Out of Food in the Last Year: Not on file  Transportation Needs:   . Lack of Transportation  (Medical): Not on file  . Lack of Transportation (Non-Medical): Not on file  Physical Activity:   . Days of Exercise per Week: Not on file  . Minutes of Exercise per Session: Not on file  Stress:   . Feeling of Stress : Not on file  Social Connections:   . Frequency of Communication with Friends and Family: Not on file  . Frequency of Social Gatherings with Friends and Family: Not on file  . Attends Religious Services: Not on file  . Active Member of Clubs or Organizations: Not on file  . Attends Archivist Meetings: Not on file  . Marital Status: Not on file  Intimate Partner Violence:   . Fear of Current or Ex-Partner: Not on file  . Emotionally Abused: Not on file  . Physically Abused:  Not on file  . Sexually Abused: Not on file    Physical Exam Pulmonary:     Effort: No respiratory distress.     Breath sounds: No wheezing or rales.  Musculoskeletal:        General: No swelling.     Right lower leg: No edema.     Left lower leg: No edema.  Skin:    General: Skin is warm and dry.         Future Appointments  Date Time Provider [place]  06/17/2019  8:00 PM Sueanne Margarita, MD [facility]  06/24/2019 10:15 AM Richardson Dopp T, PA-C [facility]  09/01/2019  1:40 PM Larey Dresser, MD [facility] None  09/04/2019  9:30 AM Gardiner Barefoot, DPM [facility]     BP 118/70 (BP Location: Left Arm, Patient Position: Sitting, Cuff Size: Normal)   Pulse 71   Temp (!) 97.2 F (36.2 C)   Resp 15   Wt 208 lb (94.3 kg)   SpO2 98%   BMI 32.58 kg/m  CBG 148  Weight yesterday-209 Last visit weight-206  ATF pt CAO x4 sitting in the living room with no complaints.  He went to get lab work done today. He also has a home sleep study scheduled for tonight.  He said that he was very tired yesterday after leaving he foot doctor.  He said that he was wheezing right before he laid down yesterday but it stopped after taking tamulsin.  He denies  sob, chest pain and dizziness today.  He's taken all of his medications for the past week including this morning.  rx bottles verified and pill box refilled.    His wife said that she's finding different ways of cooking with limited sodium.  Mr. Brodrick said that he's also cutting back on eating foods high in sugar.    Medication ordered: None  Jalil Lorusso, EMT Paramedic [phone] 06/17/2019    ACTION: Home visit completed

## 2019-06-18 ENCOUNTER — Other Ambulatory Visit (HOSPITAL_BASED_OUTPATIENT_CLINIC_OR_DEPARTMENT_OTHER): Payer: Self-pay

## 2019-06-18 DIAGNOSIS — G4733 Obstructive sleep apnea (adult) (pediatric): Secondary | ICD-10-CM

## 2019-06-22 ENCOUNTER — Other Ambulatory Visit (HOSPITAL_COMMUNITY): Payer: Self-pay | Admitting: Cardiology

## 2019-06-22 ENCOUNTER — Ambulatory Visit: Payer: Medicare Other | Admitting: Physician Assistant

## 2019-06-23 NOTE — Progress Notes (Signed)
Cardiology Office Note:    Date:  06/24/2019   ID:  [name], DOB [birth year], MRN [id number]  PCP:  Clinic, Medical, MD (Inactive)  Cardiologist:  Sherren Mocha, MD  Electrophysiologist:  None  Advanced Heart Failure Clinic:  Loralie Champagne, MD    Referring MD: No ref. provider found   Chief Complaint  Patient presents with  . Follow-up    CHF    History of Present Illness:    [name] is a 68 y.o. male with:  -Coronary artery disease -Non-obs by Cath in 2012 - Myoview 4/15: no ischemia -Combined Systolic and Diastolic CHF -[id number]: EF 45-50 - Followed by CHF clinic - Cardiomems implanted in 05/2019 -AAA -Thoracic aortic aneurysm -CT 12/2018: 5.1 cm >> eval by [facility] (Dr. Orvan Seen) >> poor candidate for repair; no need for serial CTs -Mildaortic insufficiency -Hypertension -Hyperlipidemia -Diabetes mellitus 2 -Chronic kidney disease3 -Probable COPD -Hx of recurrent pneumonia - s/p multiple admx for hypoxic resp failure -S/p prior PEA arrest 10/2018 -Hx of TIA - Sleep apnea  [name] was admitted several times in the last year with hypoxic respiratory failure. He was admitted in April 2020 with hypoxic respiratory failure in the setting of pneumonia and decompensated heart failure/pulmonary edema. His hospitalization was complicated by PEA arrest and AKI. EF was 45-50 by echocardiogram with mild diastolic dysfunction. He was admitted again in June 2020 with hypoxic respiratory failure (sats reportedly 30% upon presentation) in the setting of hypertensive crisis, pneumonia and decompensated heart failure. There was also reported syncope. Follow-up cardiac monitor demonstrated sinus rhythm, PVCs and short runs of NSVT (up to 7 beats). I saw him last in 03/2019.  He was referred to the CHF Clinic.  He underwent Cardiomems implant.  He was last seen by Dr. Aundra Dubin 06/03/2019.  He returns for follow-up. He is here alone. He  has been doing well. He has not had chest pain, syncope. His leg swelling is improved. He has not had significant shortness of breath. He has noted some wheezing from time to time.    Prior CV studies:   The following studies were reviewed today:   R cardiac catheterization 05/27/2019 RA mean 2 RV 33/8 PA 33/8, mean 22 PCWP mean 11 Oxygen saturations: PA 70% AO 95% Cardiac Output (Fick) 6.84  Cardiac Index (Fick) 3.32 PVR 1.6 Cardiac Output (Thermo) 8.33 Cardiac Index (Thermo) 4.04  1. Normal right and left heart filling pressures, preserved cardiac output.  2. Successful placement of Cardiomems pressure sensor.    LE arterial US 04/27/2019 Summary: Right: No evidence of stenosis. Arterial plaque seen throughout leg.  Left: No evidence of stenosis. Arterial plaque seen throughout leg. Slightly elevated PTA velocities but no stenosis seen.   Cardiac Monitor 02/11/2019 1. The basic rhythm is normal sinus with an average heart rate of 78 bpm 2. There are PVCs and short runs of nonsustained VT up to 7 beats 3. No evidence of atrial fibrillation, atrial flutter, or supraventricular arrhythmia 4. No bradycardic events or pathologic pauses 5. Intermittent bundle branch block  Chest CT 12/25/2018 IMPRESSION: Large bilateral airspace opacities are noted, most consistent with multifocal pneumonia.  5.1 cm ascending thoracic aortic aneurysm. Recommend semi-annual imaging followup by CTA or MRA and referral to cardiothoracic surgery if not already obtained. This recommendation follows 2010 ACCF/AHA/AATS/ACR/ASA/SCA/SCAI/SIR/STS/SVM Guidelines for the Diagnosis and Management of Patients With Thoracic Aortic Disease. Circulation. 2010; 121: Y801-K553. Aortic aneurysm NOS (ICD10-I71.9).  Aortic Atherosclerosis (ICD10-I70.0).   Echocardiogram 10/15/2018 EF 45-50, mild conc LVH,  Gr 1 DD, diff HK, normal RVSF, severe MAC, mod calc of AoV, mild AI, Ao root 39, ascending  aorta 44 mm  Echocardiogram 06/02/18 EF 45-50, no RWMA, Gr 2 DD, mod AI, mild MR, mild LAE, PASP 35  Myoview 10/11/13 IMPRESSION: 1. No scintigraphic evidence of prior infarction or pharmacologically induced ischemia. 2. Mildly dilated left ventricular cavity with mild global hypokinesia. Ejection fraction - 51%.  Cardiac catheterization 03/22/11 EF 55 LM ok LAD ok OM1 and OM2 30 RCA prox 30  Past Medical History:  Diagnosis Date  . Blockage of coronary artery of heart ([place]) 10/17/2011   wife states blocked 30%  . Diabetes mellitus 10/17/2011   newly dx today  . Edema leg    right leg has leaky valve and right foot swells  . Emphysema   . Excessive ear wax   . Fatty liver   . Hernia    near navel  . Hyperlipidemia   . Hypertension   . Leaky heart valve   . Neuropathy   . Rheumatic fever   . Slow urinary stream   . TIA (transient ischemic attack)    Surgical Hx: The patient  has a past surgical history that includes Lithotripsy; Circumcision; PRESSURE SENSOR/CARDIOMEMS (N/A, 05/27/2019); and RIGHT HEART CATH (N/A, 05/27/2019).   Current Medications: Current Meds  Medication Sig  . Accu-Chek FastClix Lancets MISC USE UTD  . ACCU-CHEK GUIDE test strip U TO TEST BLOOD SUGAR QD  . aspirin EC 81 MG tablet Take 81 mg by mouth daily.  Marland Kitchen atorvastatin (LIPITOR) 40 MG tablet Take 1 tablet (40 mg total) by mouth every evening.  . Blood Glucose Monitoring Suppl (ACCU-CHEK GUIDE) w/Device KIT U TO TEST BLOOD SUGAR QD  . carvedilol (COREG) 6.25 MG tablet Take 1.5 tablets (9.375 mg total) by mouth 2 (two) times daily.  Marland Kitchen Dextromethorphan HBr (ROBITUSSIN LINGERING COUGHGELS) 15 MG CAPS Take 15 mg by mouth daily as needed (cough).  . finasteride (PROSCAR) 5 MG tablet Take 5 mg by mouth daily.  Marland Kitchen gabapentin (NEURONTIN) 300 MG capsule Take 300 mg by mouth 4 (four) times daily.   . hydrOXYzine (VISTARIL) 50 MG capsule Take 50 mg by mouth 3 (three) times daily.  . metFORMIN  (GLUCOPHAGE) 1000 MG tablet Take 1,000 mg by mouth 2 (two) times a day.   . montelukast (SINGULAIR) 10 MG tablet Take 10 mg by mouth daily.   . Multiple Vitamin (MULTIVITAMIN) tablet Take 1 tablet by mouth daily.  . pantoprazole (PROTONIX) 40 MG tablet Take 1 tablet (40 mg total) by mouth daily.  . potassium chloride SA (KLOR-CON) 20 MEQ tablet Take 20 mEq by mouth daily.   . tamsulosin (FLOMAX) 0.4 MG CAPS capsule Take 0.4 mg by mouth daily.  Marland Kitchen torsemide (DEMADEX) 20 MG tablet Take 2 tablets (40 mg total) by mouth every morning AND 1 tablet (20 mg total) every evening.     Allergies:   Cashew nut oil and Percocet [oxycodone-acetaminophen]   Social History   Tobacco Use  . Smoking status: Never Smoker  . Smokeless tobacco: Never Used  Substance Use Topics  . Alcohol use: No  . Drug use: No     Family Hx: The patient's family history includes Aneurysm in his mother; Coronary artery disease in his brother, brother, and sister; Emphysema in his father and mother.  ROS:   Please see the history of present illness.    ROS All other systems reviewed and are negative.   EKGs/Labs/Other Test Reviewed:  EKG:  EKG is not ordered today.  The ekg ordered today demonstrates n/a  Recent Labs: 12/25/2018: ALT 31; TSH 2.029 12/26/2018: Magnesium 2.0 04/03/2019: B Natriuretic Peptide 138.2 05/25/2019: Platelets 332 05/27/2019: Hemoglobin 11.2 06/17/2019: BUN 28; Creatinine, Ser 1.51; Potassium 4.1; Sodium 142   Recent Lipid Panel Lab Results  Component Value Date/Time   CHOL 165 10/16/2018 04:52 AM   TRIG 204 (H) 10/16/2018 04:52 AM   HDL 33 (L) 10/16/2018 04:52 AM   CHOLHDL 5.0 10/16/2018 04:52 AM   LDLCALC 91 10/16/2018 04:52 AM    Physical Exam:    VS:  BP 126/72   Pulse 78   Ht _0  (1.702 m)   Wt 216 lb (98 kg)   SpO2 96%   BMI 33.83 kg/m     Wt Readings from Last 3 Encounters:  06/24/19 216 lb (98 kg)  06/24/19 210 lb (95.3 kg)  06/17/19 207 lb (93.9 kg)      Physical Exam  Constitutional: He is oriented to person, place, and time. He appears well-developed and well-nourished. No distress.  HENT:  Head: Normocephalic and atraumatic.  Cardiovascular: Normal rate, regular rhythm, S1 normal and S2 normal.  Murmur heard.  Systolic murmur is present with a grade of 2/6 at the upper right sternal border and upper left sternal border. Pulmonary/Chest: He has wheezes (expiratory) in the right upper field, the right lower field, the left upper field and the left lower field. He has no rales.  Abdominal: Soft.  Musculoskeletal:        General: Edema (trace ankle edema bilat; wraps in place) present.     Cervical back: Neck supple.  Neurological: He is alert and oriented to person, place, and time.  Skin: Skin is warm and dry.    ASSESSMENT & PLAN:    1. Chronic combined systolic and diastolic CHF  Primarily diastolic. EF 45-50%. He likely has a right-sided heart failure component as well. He has been managed closely by the heart failure clinic for the last couple of months. He looks better than I have ever seen him. His swelling is significantly improved. He has a CardioMEMS device and is also enrolled in the para-medicine clinic. Continue current therapy. I will check with Dr. Aundra Dubin regarding follow-up going forward. For now, I will make sure he has a 49-monthfollow-up with Dr. CBurt Knackor me.    Dispo:  Return in about 6 months (around 12/23/2019) for Routine Follow Up, w/ Dr. CBurt Knack or SRichardson Dopp PA-C, (virtual or in-person).   Medication Adjustments/Labs and Tests Ordered: Current medicines are reviewed at length with the patient today.  Concerns regarding medicines are outlined above.  Tests Ordered: No orders of the defined types were placed in this encounter.  Medication Changes: No orders of the defined types were placed in this encounter.   Signed, SRichardson Dopp PA-C  06/24/2019 11:01 AM    [facility] [address] [place]  [id number]: [phone] Fax: [phone]

## 2019-06-23 NOTE — Procedures (Signed)
    Patient Name: [name], [name] Date: 06/17/2019 Gender: Male D.O.B: 1950/11/28 Age (years): 89 Referring Provider: Fransico Him MD, ABSM Height (inches): 67 Interpreting Physician: Fransico Him MD, ABSM Weight (lbs): 207 RPSGT: Laren Everts BMI: 32 MRN: [id number] Neck Size: 18.00  CLINICAL INFORMATION The patient is referred for a CPAP titration to treat sleep apnea.  SLEEP STUDY TECHNIQUE As per the AASM Manual for the Scoring of Sleep and Associated Events v2.3 (April 2016) with a hypopnea requiring 4% desaturations.  The channels recorded and monitored were frontal, central and occipital EEG, electrooculogram (EOG), submentalis EMG (chin), nasal and oral airflow, thoracic and abdominal wall motion, anterior tibialis EMG, snore microphone, electrocardiogram, and pulse oximetry. Continuous positive airway pressure (CPAP) was initiated at the beginning of the study and titrated to treat sleep-disordered breathing.  MEDICATIONS Medications self-administered by patient taken the night of the study : N/A  TECHNICIAN COMMENTS Comments added by technician: Patient talked in his/her sleep. Comments added by scorer: N/A  RESPIRATORY PARAMETERS Optimal PAP Pressure (cm): N/A AHI at Optimal Pressure (/hr):N/A Overall Minimal O2 (%):77.0  Supine % at Optimal Pressure (%): N/A Minimal O2 at Optimal Pressure (%): N/A   SLEEP ARCHITECTURE The study was initiated at 10:51:50 PM and ended at 5:00:58 AM.  Sleep onset time was 5.0 minutes and the sleep efficiency was 71.8%. The total sleep time was 265 minutes.  The patient spent 15.3% of the night in stage N1 sleep, 69.4% in stage N2 sleep, 0.0% in stage N3 and 15.3% in REM.Stage REM latency was 174.5 minutes  Wake after sleep onset was 99.1. Alpha intrusion was absent. Supine sleep was 100.00%.  CARDIAC DATA The 2 lead EKG demonstrated sinus rhythm. The mean heart rate was 70.9 beats per minute. Other EKG findings include:  PVCs.  LEG MOVEMENT DATA The total Periodic Limb Movements of Sleep (PLMS) were 0. The PLMS index was 0.0. A PLMS index of <15 is considered normal in adults.  IMPRESSIONS - The optimal PAP pressure could not be obtained due to ongoing respiratory events.   - Central sleep apnea was not noted during this titration (CAI = 1.4/h). - Severe oxygen desaturations were observed during this titration (min O2 = 77.0%). - The patient snored with soft snoring volume during this titration study. - 2-lead EKG demonstrated: PVCs - Clinically significant periodic limb movements were not noted during this study. Arousals associated with PLMs were rare.  DIAGNOSIS - Obstructive Sleep Apnea (327.23 [G47.33 ICD-10]) - Nocturnal Hypoxemia  RECOMMENDATIONS - Repeat in lab study with BiPAP titration.  - Avoid alcohol, sedatives and other CNS depressants that may worsen sleep apnea and disrupt normal sleep architecture. - Sleep hygiene should be reviewed to assess factors that may improve sleep quality. - Weight management and regular exercise should be initiated or continued.  Signed: Fransico Him, MD [facility] 06/23/2019

## 2019-06-24 ENCOUNTER — Ambulatory Visit (INDEPENDENT_AMBULATORY_CARE_PROVIDER_SITE_OTHER): Payer: Medicare Other | Admitting: Physician Assistant

## 2019-06-24 ENCOUNTER — Other Ambulatory Visit: Payer: Self-pay

## 2019-06-24 ENCOUNTER — Encounter: Payer: Self-pay | Admitting: Physician Assistant

## 2019-06-24 ENCOUNTER — Telehealth: Payer: Self-pay | Admitting: *Deleted

## 2019-06-24 ENCOUNTER — Ambulatory Visit: Payer: Medicare Other | Admitting: Physician Assistant

## 2019-06-24 ENCOUNTER — Other Ambulatory Visit (HOSPITAL_COMMUNITY): Payer: Self-pay

## 2019-06-24 VITALS — BP 126/72 | HR 78 | Ht 67.0 in | Wt 216.0 lb

## 2019-06-24 DIAGNOSIS — I2510 Atherosclerotic heart disease of native coronary artery without angina pectoris: Secondary | ICD-10-CM | POA: Diagnosis not present

## 2019-06-24 DIAGNOSIS — G4733 Obstructive sleep apnea (adult) (pediatric): Secondary | ICD-10-CM

## 2019-06-24 DIAGNOSIS — I5042 Chronic combined systolic (congestive) and diastolic (congestive) heart failure: Secondary | ICD-10-CM

## 2019-06-24 MED ORDER — CARVEDILOL 6.25 MG PO TABS: 9.3750 mg | ORAL_TABLET | Freq: Two times a day (BID) | ORAL | 3 refills | Status: DC

## 2019-06-24 NOTE — Patient Instructions (Signed)
Medication Instructions:  Your physician recommends that you continue on your current medications as directed. Please refer to the Current Medication list given to you today.  *If you need a refill on your cardiac medications before your next appointment, please call your pharmacy*  Lab Work:  None ordered today  Testing/Procedures:  None ordered today  Follow-Up: At [facility], you and your health needs are our priority.  As part of our continuing mission to provide you with exceptional heart care, we have created designated Provider Care Teams.  These Care Teams include your primary Cardiologist (physician) and Advanced Practice Providers (APPs -  Physician Assistants and Nurse Practitioners) who all work together to provide you with the care you need, when you need it.  Your next appointment:   6 month(s)  The format for your next appointment:   In Person  Provider:   You may see Michael Cooper, MD or one of the following Advanced Practice Providers on your designated Care Team:    Scott Weaver, PA-C  Vin Bhagat, PA-C  Janine Hammond, NP     

## 2019-06-24 NOTE — Telephone Encounter (Signed)
Staff message sent to [name] ok to schedule BIPAP titration . Patient has Medicare and no PA is required.

## 2019-06-24 NOTE — Telephone Encounter (Signed)
-----   Message from Sueanne Margarita, MD sent at 06/23/2019  2:47 PM EST ----- Please let patient know that they had unsuccessful CPAP titration due to severity of OSA. Please set up BiPAP titration in the sleep lab.

## 2019-06-24 NOTE — Progress Notes (Signed)
Paramedicine Encounter    Patient ID: [name], male    DOB: [birth year], 68 y.o.   MRN: [id number]    Patient Care Team: Clinic, Medical, MD (Inactive) as PCP - General Sherren Mocha, MD as PCP - Cardiology (Cardiology) Larey Dresser, MD as PCP - Advanced Heart Failure (Cardiology)  Patient Active Problem List   Diagnosis Date Noted  . Hallux limitus of left foot 06/05/2019  . Hallux limitus of right foot 06/05/2019  . Seasonal allergies 02/12/2019  . Health care maintenance 02/12/2019  . GERD (gastroesophageal reflux disease) 02/12/2019  . Nocturnal hypoxemia 02/12/2019  . OSA (obstructive sleep apnea) 02/12/2019  . Pain due to onychomycosis of toenails of both feet 01/30/2019  . Lobar pneumonia ([place]) 12/29/2018  . Diabetes mellitus type 2, controlled, without complications ([place]) 37/90/2409  . Thoracic aortic aneurysm ([place]) 12/29/2018  . Acute on chronic combined systolic and diastolic CHF (congestive heart failure) ([place]) 12/25/2018  . Syncope 12/25/2018  . Chronic systolic heart failure ([place]) 11/19/2018  . Chronic venous insufficiency 06/24/2018  . Respiratory failure ([place]) 06/01/2018  . Leg swelling 05/06/2018  . Acute respiratory failure with hypoxemia ([place]) 10/14/2017  . Influenza-like illness 10/14/2017  . Delirium 10/14/2017  . Acute urinary retention 10/14/2017  . (HFpEF) heart failure with preserved ejection fraction ([place]), pulmonary edema and LBBB of unknown chronicity  10/29/2016  . Elevated troponin I level, presume demand ischemia  10/29/2016  . AKI (acute kidney injury) ([place]) 10/29/2016  . BPH (benign prostatic hyperplasia) 10/29/2016  . Sepsis ([place]) 10/29/2016  . Bacteremia due to Escherichia coli 10/29/2016  . Fatty liver 10/29/2016  . Acute pulmonary edema (HCC)   . Acute hypoxic and hypercarbic respiratory failure (HCC) in setting of bilateral pulmonary infiltrates. Presume CAP + pulmonary edema +/-ALI 10/27/2016  . Left-sided weakness 05/04/2015   . TIA (transient ischemic attack) : Rule out. 05/04/2015  . Hyperlipidemia 05/04/2015  . Chest pain 10/10/2013  . SOB (shortness of breath) 10/10/2013  . Hyperglycemia without ketosis 10/17/2011  . HTN (hypertension) 10/17/2011  . Foot drop, left 10/17/2011  . Impacted cerumen of right ear 10/17/2011  . Dyspnea 10/17/2011  . History of TIA (transient ischemic attack) 10/17/2011  . Neuropathy ([place]) 10/17/2011  . Aortic insufficiency 03/16/2011  . Chest pain 03/16/2011    Current Outpatient Medications:  .  aspirin EC 81 MG tablet, Take 81 mg by mouth daily., Disp: , Rfl:  .  atorvastatin (LIPITOR) 40 MG tablet, Take 1 tablet (40 mg total) by mouth every evening., Disp: 30 tablet, Rfl: 0 .  Blood Glucose Monitoring Suppl (ACCU-CHEK GUIDE) w/Device KIT, U TO TEST BLOOD SUGAR QD, Disp: , Rfl:  .  finasteride (PROSCAR) 5 MG tablet, Take 5 mg by mouth daily., Disp: , Rfl:  .  gabapentin (NEURONTIN) 300 MG capsule, Take 300 mg by mouth 4 (four) times daily. , Disp: , Rfl:  .  hydrOXYzine (VISTARIL) 50 MG capsule, Take 50 mg by mouth 3 (three) times daily., Disp: , Rfl:  .  metFORMIN (GLUCOPHAGE) 1000 MG tablet, Take 1,000 mg by mouth 2 (two) times a day. , Disp: , Rfl:  .  Multiple Vitamin (MULTIVITAMIN) tablet, Take 1 tablet by mouth daily., Disp: , Rfl:  .  pantoprazole (PROTONIX) 40 MG tablet, Take 1 tablet (40 mg total) by mouth daily., Disp: 30 tablet, Rfl: 1 .  potassium chloride SA (KLOR-CON) 20 MEQ tablet, Take 20 mEq by mouth daily. , Disp: , Rfl:  .  tamsulosin (FLOMAX) 0.4 MG  CAPS capsule, Take 0.4 mg by mouth daily., Disp: , Rfl:  .  torsemide (DEMADEX) 20 MG tablet, Take 2 tablets (40 mg total) by mouth every morning AND 1 tablet (20 mg total) every evening., Disp: 90 tablet, Rfl: 6 .  Accu-Chek FastClix Lancets MISC, USE UTD, Disp: , Rfl:  .  ACCU-CHEK GUIDE test strip, U TO TEST BLOOD SUGAR QD, Disp: , Rfl:  .  carvedilol (COREG) 6.25 MG tablet, Take 1.5 tablets (9.375 mg  total) by mouth 2 (two) times daily., Disp: 270 tablet, Rfl: 3 .  Dextromethorphan HBr (ROBITUSSIN LINGERING COUGHGELS) 15 MG CAPS, Take 15 mg by mouth daily as needed (cough)., Disp: , Rfl:  .  montelukast (SINGULAIR) 10 MG tablet, Take 10 mg by mouth daily. , Disp: , Rfl:  Allergies  Allergen Reactions  . Cashew Nut Oil Palpitations  . Percocet [Oxycodone-Acetaminophen] Other (See Comments)    Hallucintaions     Social History   Socioeconomic History  . Marital status: Married    Spouse name: Not on file  . Number of children: Not on file  . Years of education: Not on file  . Highest education level: Not on file  Occupational History  . Not on file  Tobacco Use  . Smoking status: Never Smoker  . Smokeless tobacco: Never Used  Substance and Sexual Activity  . Alcohol use: No  . Drug use: No  . Sexual activity: Not Currently    Partners: Female    Birth control/protection: None  Other Topics Concern  . Not on file  Social History Narrative   Patient lives in private residence with supportive spouse   Social Determinants of Health   Financial Resource Strain:   . Difficulty of Paying Living Expenses: Not on file  Food Insecurity:   . Worried About Charity fundraiser in the Last Year: Not on file  . Ran Out of Food in the Last Year: Not on file  Transportation Needs:   . Lack of Transportation (Medical): Not on file  . Lack of Transportation (Non-Medical): Not on file  Physical Activity:   . Days of Exercise per Week: Not on file  . Minutes of Exercise per Session: Not on file  Stress:   . Feeling of Stress : Not on file  Social Connections:   . Frequency of Communication with Friends and Family: Not on file  . Frequency of Social Gatherings with Friends and Family: Not on file  . Attends Religious Services: Not on file  . Active Member of Clubs or Organizations: Not on file  . Attends Archivist Meetings: Not on file  . Marital Status: Not on file   Intimate Partner Violence:   . Fear of Current or Ex-Partner: Not on file  . Emotionally Abused: Not on file  . Physically Abused: Not on file  . Sexually Abused: Not on file    Physical Exam      Future Appointments  Date Time Provider [place]  09/01/2019  1:40 PM Larey Dresser, MD [facility] None  09/04/2019  9:30 AM Gardiner Barefoot, DPM [facility]     BP 112/80 (BP Location: Left Arm, Patient Position: Sitting, Cuff Size: Normal)   Pulse 76   Temp 98.7 F (37.1 C)   Wt 210 lb (95.3 kg)   SpO2 94%   BMI 32.89 kg/m  CBG 183  Weight yesterday-210 Last visit weight-208  ATF pt CAO x4 getting ready to go to a doctors appointment.  He doesn't have any pain, dizziness or sob. This appointment is with his cardiologist. His wife said that she would ask for an refill for the carvedilol during this visit.  Mr. Milles did do the sleep study at the facility.  He was given a bag of supplies which he doesn't know how to use.  I told him that I'll help them with it but it'll be next week, because they have errands to run after the appointment.  rx bottles verified and pill box refilled.    Medication ordered: Carvedilol  Sacred Roa, EMT Paramedic [phone] 06/24/2019    ACTION: Home visit completed

## 2019-06-24 NOTE — Telephone Encounter (Signed)
Informed patient of sleep study results and patient understanding was verbalized. Patient understands his sleep study showed they had unsuccessful CPAP titration due to severity of OSA. Please set up BiPAP titration in the sleep lab.  Pt is aware and agreeable to his results. Bipap titration sent to sleep pool.

## 2019-07-01 ENCOUNTER — Other Ambulatory Visit (HOSPITAL_COMMUNITY): Payer: Self-pay

## 2019-07-01 NOTE — Progress Notes (Signed)
Paramedicine Encounter    Patient ID: [name], male    DOB: 01-07-1951, 68 y.o.   MRN: [id number]    Patient Care Team: Clinic, Medical, MD (Inactive) as PCP - General Sherren Mocha, MD as PCP - Cardiology (Cardiology) Larey Dresser, MD as PCP - Advanced Heart Failure (Cardiology)  Patient Active Problem List   Diagnosis Date Noted  . Hallux limitus of left foot 06/05/2019  . Hallux limitus of right foot 06/05/2019  . Seasonal allergies 02/12/2019  . Health care maintenance 02/12/2019  . GERD (gastroesophageal reflux disease) 02/12/2019  . Nocturnal hypoxemia 02/12/2019  . OSA (obstructive sleep apnea) 02/12/2019  . Pain due to onychomycosis of toenails of both feet 01/30/2019  . Lobar pneumonia ([place]) 12/29/2018  . Diabetes mellitus type 2, controlled, without complications ([place]) 94/85/4627  . Thoracic aortic aneurysm ([place]) 12/29/2018  . Acute on chronic combined systolic and diastolic CHF (congestive heart failure) ([place]) 12/25/2018  . Syncope 12/25/2018  . Chronic systolic heart failure ([place]) 11/19/2018  . Chronic venous insufficiency 06/24/2018  . Respiratory failure ([place]) 06/01/2018  . Leg swelling 05/06/2018  . Acute respiratory failure with hypoxemia ([place]) 10/14/2017  . Influenza-like illness 10/14/2017  . Delirium 10/14/2017  . Acute urinary retention 10/14/2017  . (HFpEF) heart failure with preserved ejection fraction ([place]), pulmonary edema and LBBB of unknown chronicity  10/29/2016  . Elevated troponin I level, presume demand ischemia  10/29/2016  . AKI (acute kidney injury) ([place]) 10/29/2016  . BPH (benign prostatic hyperplasia) 10/29/2016  . Sepsis ([place]) 10/29/2016  . Bacteremia due to Escherichia coli 10/29/2016  . Fatty liver 10/29/2016  . Acute pulmonary edema (HCC)   . Acute hypoxic and hypercarbic respiratory failure (HCC) in setting of bilateral pulmonary infiltrates. Presume CAP + pulmonary edema +/-ALI 10/27/2016  . Left-sided weakness 05/04/2015   . TIA (transient ischemic attack) : Rule out. 05/04/2015  . Hyperlipidemia 05/04/2015  . Chest pain 10/10/2013  . SOB (shortness of breath) 10/10/2013  . Hyperglycemia without ketosis 10/17/2011  . HTN (hypertension) 10/17/2011  . Foot drop, left 10/17/2011  . Impacted cerumen of right ear 10/17/2011  . Dyspnea 10/17/2011  . History of TIA (transient ischemic attack) 10/17/2011  . Neuropathy ([place]) 10/17/2011  . Aortic insufficiency 03/16/2011  . Chest pain 03/16/2011    Current Outpatient Medications:  .  aspirin EC 81 MG tablet, Take 81 mg by mouth daily., Disp: , Rfl:  .  atorvastatin (LIPITOR) 40 MG tablet, Take 1 tablet (40 mg total) by mouth every evening., Disp: 30 tablet, Rfl: 0 .  carvedilol (COREG) 6.25 MG tablet, Take 1.5 tablets (9.375 mg total) by mouth 2 (two) times daily., Disp: 270 tablet, Rfl: 3 .  finasteride (PROSCAR) 5 MG tablet, Take 5 mg by mouth daily., Disp: , Rfl:  .  gabapentin (NEURONTIN) 300 MG capsule, Take 300 mg by mouth 4 (four) times daily. , Disp: , Rfl:  .  hydrOXYzine (VISTARIL) 50 MG capsule, Take 50 mg by mouth 3 (three) times daily., Disp: , Rfl:  .  metFORMIN (GLUCOPHAGE) 1000 MG tablet, Take 1,000 mg by mouth 2 (two) times a day. , Disp: , Rfl:  .  montelukast (SINGULAIR) 10 MG tablet, Take 10 mg by mouth daily. , Disp: , Rfl:  .  Multiple Vitamin (MULTIVITAMIN) tablet, Take 1 tablet by mouth daily., Disp: , Rfl:  .  pantoprazole (PROTONIX) 40 MG tablet, Take 1 tablet (40 mg total) by mouth daily., Disp: 30 tablet, Rfl: 1 .  potassium chloride  SA (KLOR-CON) 20 MEQ tablet, Take 20 mEq by mouth daily. , Disp: , Rfl:  .  tamsulosin (FLOMAX) 0.4 MG CAPS capsule, Take 0.4 mg by mouth daily., Disp: , Rfl:  .  torsemide (DEMADEX) 20 MG tablet, Take 2 tablets (40 mg total) by mouth every morning AND 1 tablet (20 mg total) every evening., Disp: 90 tablet, Rfl: 6 .  Accu-Chek FastClix Lancets MISC, USE UTD, Disp: , Rfl:  .  ACCU-CHEK GUIDE test strip, U  TO TEST BLOOD SUGAR QD, Disp: , Rfl:  .  Blood Glucose Monitoring Suppl (ACCU-CHEK GUIDE) w/Device KIT, U TO TEST BLOOD SUGAR QD, Disp: , Rfl:  .  Dextromethorphan HBr (ROBITUSSIN LINGERING COUGHGELS) 15 MG CAPS, Take 15 mg by mouth daily as needed (cough)., Disp: , Rfl:  Allergies  Allergen Reactions  . Cashew Nut Oil Palpitations  . Percocet [Oxycodone-Acetaminophen] Other (See Comments)    Hallucintaions     Social History   Socioeconomic History  . Marital status: Married    Spouse name: Not on file  . Number of children: Not on file  . Years of education: Not on file  . Highest education level: Not on file  Occupational History  . Not on file  Tobacco Use  . Smoking status: Never Smoker  . Smokeless tobacco: Never Used  Substance and Sexual Activity  . Alcohol use: No  . Drug use: No  . Sexual activity: Not Currently    Partners: Female    Birth control/protection: None  Other Topics Concern  . Not on file  Social History Narrative   Patient lives in private residence with supportive spouse   Social Determinants of Health   Financial Resource Strain:   . Difficulty of Paying Living Expenses: Not on file  Food Insecurity:   . Worried About Charity fundraiser in the Last Year: Not on file  . Ran Out of Food in the Last Year: Not on file  Transportation Needs:   . Lack of Transportation (Medical): Not on file  . Lack of Transportation (Non-Medical): Not on file  Physical Activity:   . Days of Exercise per Week: Not on file  . Minutes of Exercise per Session: Not on file  Stress:   . Feeling of Stress : Not on file  Social Connections:   . Frequency of Communication with Friends and Family: Not on file  . Frequency of Social Gatherings with Friends and Family: Not on file  . Attends Religious Services: Not on file  . Active Member of Clubs or Organizations: Not on file  . Attends Archivist Meetings: Not on file  . Marital Status: Not on file   Intimate Partner Violence:   . Fear of Current or Ex-Partner: Not on file  . Emotionally Abused: Not on file  . Physically Abused: Not on file  . Sexually Abused: Not on file    Physical Exam Pulmonary:     Effort: No respiratory distress.     Breath sounds: No wheezing or rales.  Musculoskeletal:     Right lower leg: No edema.     Left lower leg: No edema.  Skin:    General: Skin is warm and dry.         Future Appointments  Date Time Provider [place]  07/18/2019 12:30 PM [facility]EN[facility] None  07/21/2019  8:00 PM Sueanne Margarita, MD [facility]  09/01/2019  1:40 PM Larey Dresser, MD [facility] None  09/04/2019  9:30  AM Gardiner Barefoot, DPM [facility]     BP 128/84 (BP Location: Left Arm, Patient Position: Sitting, Cuff Size: Normal)   Pulse 71   Temp 98.2 F (36.8 C)   Wt 211 lb (95.7 kg)   SpO2 96%   BMI 33.05 kg/m  cbg 261  Weight yesterday-211 Last visit weight-210  ATF pt CAO x4 sitting in the living room watching tv with his wife with no complaints. He denies sob, chest pain and dizziness.  He's taken all of his medications this week and prior to this visit.  He has to get another sleep study done, so he still doesn't have a cpap.  His wife is still wrapping his legs. He said that the sores on his legs is a lot better than it was before.  rx bottles verified and pill box refilled.     Medication ordered: None  [place] Ice, EMT Paramedic [phone] 07/02/2019    ACTION: Home visit completed

## 2019-07-02 ENCOUNTER — Telehealth: Payer: Self-pay | Admitting: *Deleted

## 2019-07-02 NOTE — Telephone Encounter (Signed)
Patient is scheduled for BIPAP Titration on 07/21/19. Pt is scheduled for COVID screening on 07/18/19 prior to titration.   Patient understands his titration study will be done at [facility] sleep lab. Patient understands he will receive a letter in a week or so detailing appointment, date, time, and location. Patient understands to call if he does not receive the letter  in a timely manner. Patient agrees with treatment and thanked me for call.

## 2019-07-02 NOTE — Telephone Encounter (Signed)
-----   Message from Lauralee Evener, [place] sent at 06/24/2019  3:20 PM EST ----- Regarding: RE: precert Ok to schedule no PA is required. Patient has Medicare. ----- Message ----- From: Freada Bergeron, CMA Sent: 06/24/2019   2:38 PM EST To: Cv Div Sleep Studies Subject: precert                                        BiPAP titration in the sleep lab.

## 2019-07-09 ENCOUNTER — Telehealth (HOSPITAL_COMMUNITY): Payer: Self-pay

## 2019-07-09 ENCOUNTER — Other Ambulatory Visit (HOSPITAL_COMMUNITY): Payer: Self-pay

## 2019-07-09 NOTE — Progress Notes (Signed)
Paramedicine Encounter    Patient ID: [name], male    DOB: 01/23/1951, 69 y.o.   MRN: [id number]   Patient Care Team: Clinic, Medical, MD (Inactive) as PCP - General Sherren Mocha, MD as PCP - Cardiology (Cardiology) Larey Dresser, MD as PCP - Advanced Heart Failure (Cardiology)  Patient Active Problem List   Diagnosis Date Noted  . Hallux limitus of left foot 06/05/2019  . Hallux limitus of right foot 06/05/2019  . Seasonal allergies 02/12/2019  . Health care maintenance 02/12/2019  . GERD (gastroesophageal reflux disease) 02/12/2019  . Nocturnal hypoxemia 02/12/2019  . OSA (obstructive sleep apnea) 02/12/2019  . Pain due to onychomycosis of toenails of both feet 01/30/2019  . Lobar pneumonia ([place]) 12/29/2018  . Diabetes mellitus type 2, controlled, without complications ([place]) 87/56/4332  . Thoracic aortic aneurysm ([place]) 12/29/2018  . Acute on chronic combined systolic and diastolic CHF (congestive heart failure) ([place]) 12/25/2018  . Syncope 12/25/2018  . Chronic systolic heart failure ([place]) 11/19/2018  . Chronic venous insufficiency 06/24/2018  . Respiratory failure ([place]) 06/01/2018  . Leg swelling 05/06/2018  . Acute respiratory failure with hypoxemia ([place]) 10/14/2017  . Influenza-like illness 10/14/2017  . Delirium 10/14/2017  . Acute urinary retention 10/14/2017  . (HFpEF) heart failure with preserved ejection fraction ([place]), pulmonary edema and LBBB of unknown chronicity  10/29/2016  . Elevated troponin I level, presume demand ischemia  10/29/2016  . AKI (acute kidney injury) ([place]) 10/29/2016  . BPH (benign prostatic hyperplasia) 10/29/2016  . Sepsis ([place]) 10/29/2016  . Bacteremia due to Escherichia coli 10/29/2016  . Fatty liver 10/29/2016  . Acute pulmonary edema (HCC)   . Acute hypoxic and hypercarbic respiratory failure (HCC) in setting of bilateral pulmonary infiltrates. Presume CAP + pulmonary edema +/-ALI 10/27/2016  . Left-sided weakness 05/04/2015  .  TIA (transient ischemic attack) : Rule out. 05/04/2015  . Hyperlipidemia 05/04/2015  . Chest pain 10/10/2013  . SOB (shortness of breath) 10/10/2013  . Hyperglycemia without ketosis 10/17/2011  . HTN (hypertension) 10/17/2011  . Foot drop, left 10/17/2011  . Impacted cerumen of right ear 10/17/2011  . Dyspnea 10/17/2011  . History of TIA (transient ischemic attack) 10/17/2011  . Neuropathy ([place]) 10/17/2011  . Aortic insufficiency 03/16/2011  . Chest pain 03/16/2011    Current Outpatient Medications:  .  Accu-Chek FastClix Lancets MISC, USE UTD, Disp: , Rfl:  .  ACCU-CHEK GUIDE test strip, U TO TEST BLOOD SUGAR QD, Disp: , Rfl:  .  aspirin EC 81 MG tablet, Take 81 mg by mouth daily., Disp: , Rfl:  .  atorvastatin (LIPITOR) 40 MG tablet, Take 1 tablet (40 mg total) by mouth every evening., Disp: 30 tablet, Rfl: 0 .  Blood Glucose Monitoring Suppl (ACCU-CHEK GUIDE) w/Device KIT, U TO TEST BLOOD SUGAR QD, Disp: , Rfl:  .  carvedilol (COREG) 6.25 MG tablet, Take 1.5 tablets (9.375 mg total) by mouth 2 (two) times daily., Disp: 270 tablet, Rfl: 3 .  finasteride (PROSCAR) 5 MG tablet, Take 5 mg by mouth daily., Disp: , Rfl:  .  gabapentin (NEURONTIN) 300 MG capsule, Take 300 mg by mouth 4 (four) times daily. , Disp: , Rfl:  .  hydrOXYzine (VISTARIL) 50 MG capsule, Take 50 mg by mouth 3 (three) times daily., Disp: , Rfl:  .  metFORMIN (GLUCOPHAGE) 1000 MG tablet, Take 1,000 mg by mouth 2 (two) times a day. , Disp: , Rfl:  .  montelukast (SINGULAIR) 10 MG tablet, Take 10 mg by mouth  daily. , Disp: , Rfl:  .  Multiple Vitamin (MULTIVITAMIN) tablet, Take 1 tablet by mouth daily., Disp: , Rfl:  .  pantoprazole (PROTONIX) 40 MG tablet, Take 1 tablet (40 mg total) by mouth daily., Disp: 30 tablet, Rfl: 1 .  potassium chloride SA (KLOR-CON) 20 MEQ tablet, Take 20 mEq by mouth daily. , Disp: , Rfl:  .  tamsulosin (FLOMAX) 0.4 MG CAPS capsule, Take 0.4 mg by mouth daily., Disp: , Rfl:  .  torsemide  (DEMADEX) 20 MG tablet, Take 2 tablets (40 mg total) by mouth every morning AND 1 tablet (20 mg total) every evening., Disp: 90 tablet, Rfl: 6 .  Dextromethorphan HBr (ROBITUSSIN LINGERING COUGHGELS) 15 MG CAPS, Take 15 mg by mouth daily as needed (cough)., Disp: , Rfl:  Allergies  Allergen Reactions  . Cashew Nut Oil Palpitations  . Percocet [Oxycodone-Acetaminophen] Other (See Comments)    Hallucintaions     Social History   Socioeconomic History  . Marital status: Married    Spouse name: Not on file  . Number of children: Not on file  . Years of education: Not on file  . Highest education level: Not on file  Occupational History  . Not on file  Tobacco Use  . Smoking status: Never Smoker  . Smokeless tobacco: Never Used  Substance and Sexual Activity  . Alcohol use: No  . Drug use: No  . Sexual activity: Not Currently    Partners: Female    Birth control/protection: None  Other Topics Concern  . Not on file  Social History Narrative   Patient lives in private residence with supportive spouse   Social Determinants of Health   Financial Resource Strain:   . Difficulty of Paying Living Expenses: Not on file  Food Insecurity:   . Worried About Charity fundraiser in the Last Year: Not on file  . Ran Out of Food in the Last Year: Not on file  Transportation Needs:   . Lack of Transportation (Medical): Not on file  . Lack of Transportation (Non-Medical): Not on file  Physical Activity:   . Days of Exercise per Week: Not on file  . Minutes of Exercise per Session: Not on file  Stress:   . Feeling of Stress : Not on file  Social Connections:   . Frequency of Communication with Friends and Family: Not on file  . Frequency of Social Gatherings with Friends and Family: Not on file  . Attends Religious Services: Not on file  . Active Member of Clubs or Organizations: Not on file  . Attends Archivist Meetings: Not on file  . Marital Status: Not on file   Intimate Partner Violence:   . Fear of Current or Ex-Partner: Not on file  . Emotionally Abused: Not on file  . Physically Abused: Not on file  . Sexually Abused: Not on file    Physical Exam Vitals reviewed.  Constitutional:      Appearance: He is normal weight.  HENT:     Head: Normocephalic.     Nose: Nose normal.     Mouth/Throat:     Mouth: Mucous membranes are moist.  Eyes:     Pupils: Pupils are equal, round, and reactive to light.  Cardiovascular:     Rate and Rhythm: Normal rate and regular rhythm.     Pulses: Normal pulses.     Heart sounds: Normal heart sounds.  Pulmonary:     Effort: Pulmonary effort is normal.  Breath sounds: Normal breath sounds.     Comments: congestion with productive cough.  Abdominal:     General: Abdomen is flat.     Palpations: Abdomen is soft.  Musculoskeletal:        General: Normal range of motion.     Right lower leg: No edema.     Left lower leg: No edema.     Comments: Redness to both legs, patient stated he will be wrapping same later today.   Skin:    Capillary Refill: Capillary refill takes less than 2 seconds.  Neurological:     Mental Status: Mental status is at baseline.  Psychiatric:        Mood and Affect: Mood normal.     Arrived for home visit for Mr. Stolp who was alert and oriented ambulating inside his home without difficulty. Ahmet stated he has been feeling okay aside from a productive cough with some congestion which he is taking over the counter medications for and has been helping. No shortness of breath noted, no headache, fever or chills. Lung sounds noted to be clear. Vitals as noted in report. Medications were verified and confirmed. Pill box filled accordingly. Patient was made aware that Karena Addison would be back next week and she would be following up upon her return. Patient understood and home visit was completed.   Weight today- 208lbs Weight last visit- 211lbs  CBG- 241   Future Appointments   Date Time Provider [place]  07/18/2019 12:30 PM [facility]-SCREENING [facility] None  07/21/2019  8:00 PM Sueanne Margarita, MD [facility]  09/01/2019  1:40 PM Larey Dresser, MD [facility] None  09/04/2019  9:30 AM Gardiner Barefoot, DPM [facility]

## 2019-07-09 NOTE — Telephone Encounter (Signed)
Confirmed appointment for 1000.

## 2019-07-15 ENCOUNTER — Other Ambulatory Visit (HOSPITAL_COMMUNITY): Payer: Self-pay

## 2019-07-15 NOTE — Progress Notes (Signed)
Paramedicine Encounter    Patient ID: [name], male    DOB: 01-07-1951, 69 y.o.   MRN: [id number]    Patient Care Team: Clinic, Medical, MD (Inactive) as PCP - General Sherren Mocha, MD as PCP - Cardiology (Cardiology) Larey Dresser, MD as PCP - Advanced Heart Failure (Cardiology)  Patient Active Problem List   Diagnosis Date Noted  . Hallux limitus of left foot 06/05/2019  . Hallux limitus of right foot 06/05/2019  . Seasonal allergies 02/12/2019  . Health care maintenance 02/12/2019  . GERD (gastroesophageal reflux disease) 02/12/2019  . Nocturnal hypoxemia 02/12/2019  . OSA (obstructive sleep apnea) 02/12/2019  . Pain due to onychomycosis of toenails of both feet 01/30/2019  . Lobar pneumonia ([place]) 12/29/2018  . Diabetes mellitus type 2, controlled, without complications ([place]) 94/85/4627  . Thoracic aortic aneurysm ([place]) 12/29/2018  . Acute on chronic combined systolic and diastolic CHF (congestive heart failure) ([place]) 12/25/2018  . Syncope 12/25/2018  . Chronic systolic heart failure ([place]) 11/19/2018  . Chronic venous insufficiency 06/24/2018  . Respiratory failure ([place]) 06/01/2018  . Leg swelling 05/06/2018  . Acute respiratory failure with hypoxemia ([place]) 10/14/2017  . Influenza-like illness 10/14/2017  . Delirium 10/14/2017  . Acute urinary retention 10/14/2017  . (HFpEF) heart failure with preserved ejection fraction ([place]), pulmonary edema and LBBB of unknown chronicity  10/29/2016  . Elevated troponin I level, presume demand ischemia  10/29/2016  . AKI (acute kidney injury) ([place]) 10/29/2016  . BPH (benign prostatic hyperplasia) 10/29/2016  . Sepsis ([place]) 10/29/2016  . Bacteremia due to Escherichia coli 10/29/2016  . Fatty liver 10/29/2016  . Acute pulmonary edema (HCC)   . Acute hypoxic and hypercarbic respiratory failure (HCC) in setting of bilateral pulmonary infiltrates. Presume CAP + pulmonary edema +/-ALI 10/27/2016  . Left-sided weakness 05/04/2015   . TIA (transient ischemic attack) : Rule out. 05/04/2015  . Hyperlipidemia 05/04/2015  . Chest pain 10/10/2013  . SOB (shortness of breath) 10/10/2013  . Hyperglycemia without ketosis 10/17/2011  . HTN (hypertension) 10/17/2011  . Foot drop, left 10/17/2011  . Impacted cerumen of right ear 10/17/2011  . Dyspnea 10/17/2011  . History of TIA (transient ischemic attack) 10/17/2011  . Neuropathy ([place]) 10/17/2011  . Aortic insufficiency 03/16/2011  . Chest pain 03/16/2011    Current Outpatient Medications:  .  aspirin EC 81 MG tablet, Take 81 mg by mouth daily., Disp: , Rfl:  .  atorvastatin (LIPITOR) 40 MG tablet, Take 1 tablet (40 mg total) by mouth every evening., Disp: 30 tablet, Rfl: 0 .  carvedilol (COREG) 6.25 MG tablet, Take 1.5 tablets (9.375 mg total) by mouth 2 (two) times daily., Disp: 270 tablet, Rfl: 3 .  finasteride (PROSCAR) 5 MG tablet, Take 5 mg by mouth daily., Disp: , Rfl:  .  gabapentin (NEURONTIN) 300 MG capsule, Take 300 mg by mouth 4 (four) times daily. , Disp: , Rfl:  .  hydrOXYzine (VISTARIL) 50 MG capsule, Take 50 mg by mouth 3 (three) times daily., Disp: , Rfl:  .  metFORMIN (GLUCOPHAGE) 1000 MG tablet, Take 1,000 mg by mouth 2 (two) times a day. , Disp: , Rfl:  .  montelukast (SINGULAIR) 10 MG tablet, Take 10 mg by mouth daily. , Disp: , Rfl:  .  Multiple Vitamin (MULTIVITAMIN) tablet, Take 1 tablet by mouth daily., Disp: , Rfl:  .  pantoprazole (PROTONIX) 40 MG tablet, Take 1 tablet (40 mg total) by mouth daily., Disp: 30 tablet, Rfl: 1 .  potassium chloride  SA (KLOR-CON) 20 MEQ tablet, Take 20 mEq by mouth daily. , Disp: , Rfl:  .  tamsulosin (FLOMAX) 0.4 MG CAPS capsule, Take 0.4 mg by mouth daily., Disp: , Rfl:  .  torsemide (DEMADEX) 20 MG tablet, Take 2 tablets (40 mg total) by mouth every morning AND 1 tablet (20 mg total) every evening., Disp: 90 tablet, Rfl: 6 .  Accu-Chek FastClix Lancets MISC, USE UTD, Disp: , Rfl:  .  ACCU-CHEK GUIDE test strip, U  TO TEST BLOOD SUGAR QD, Disp: , Rfl:  .  Blood Glucose Monitoring Suppl (ACCU-CHEK GUIDE) w/Device KIT, U TO TEST BLOOD SUGAR QD, Disp: , Rfl:  .  Dextromethorphan HBr (ROBITUSSIN LINGERING COUGHGELS) 15 MG CAPS, Take 15 mg by mouth daily as needed (cough)., Disp: , Rfl:  Allergies  Allergen Reactions  . Cashew Nut Oil Palpitations  . Percocet [Oxycodone-Acetaminophen] Other (See Comments)    Hallucintaions     Social History   Socioeconomic History  . Marital status: Married    Spouse name: Not on file  . Number of children: Not on file  . Years of education: Not on file  . Highest education level: Not on file  Occupational History  . Not on file  Tobacco Use  . Smoking status: Never Smoker  . Smokeless tobacco: Never Used  Substance and Sexual Activity  . Alcohol use: No  . Drug use: No  . Sexual activity: Not Currently    Partners: Female    Birth control/protection: None  Other Topics Concern  . Not on file  Social History Narrative   Patient lives in private residence with supportive spouse   Social Determinants of Health   Financial Resource Strain:   . Difficulty of Paying Living Expenses: Not on file  Food Insecurity:   . Worried About Charity fundraiser in the Last Year: Not on file  . Ran Out of Food in the Last Year: Not on file  Transportation Needs:   . Lack of Transportation (Medical): Not on file  . Lack of Transportation (Non-Medical): Not on file  Physical Activity:   . Days of Exercise per Week: Not on file  . Minutes of Exercise per Session: Not on file  Stress:   . Feeling of Stress : Not on file  Social Connections:   . Frequency of Communication with Friends and Family: Not on file  . Frequency of Social Gatherings with Friends and Family: Not on file  . Attends Religious Services: Not on file  . Active Member of Clubs or Organizations: Not on file  . Attends Archivist Meetings: Not on file  . Marital Status: Not on file   Intimate Partner Violence:   . Fear of Current or Ex-Partner: Not on file  . Emotionally Abused: Not on file  . Physically Abused: Not on file  . Sexually Abused: Not on file    Physical Exam Pulmonary:     Effort: No respiratory distress.     Breath sounds: No wheezing.  Abdominal:     Tenderness: There is no abdominal tenderness.  Musculoskeletal:        General: No swelling.     Right lower leg: No edema.     Left lower leg: No edema.  Skin:    General: Skin is warm and dry.         Future Appointments  Date Time Provider [place]  07/18/2019 12:30 PM [facility]CREENING [facility] None  07/21/2019  8:00 PM Turner,  Eber Hong, MD [facility]  09/01/2019  1:40 PM Larey Dresser, MD [facility] None  09/04/2019  9:30 AM Gardiner Barefoot, DPM [facility]     BP 122/74 (BP Location: Left Arm, Patient Position: Sitting, Cuff Size: Normal)   Pulse 71   Temp 97.9 F (36.6 C)   SpO2 95%   Weight yesterday-208 Last visit weight-208  ATF pt CAO x4 sitting in the recliner watching tv w/no complaints.  He said that he's sleeping a little better at night but still wakes up sometimes.  He denies sob, chest pain and dizziness.  He's taken all of his medications for the past week.  His wife is still wrapping his legs and said that they look good with no blisters or open sores.  rx bottles verified and pill box refilled.    Medication ordered: Finasteride  Lanessa Shill, EMT Paramedic [phone] 07/15/2019    ACTION: Home visit completed

## 2019-07-18 ENCOUNTER — Other Ambulatory Visit (HOSPITAL_COMMUNITY)
Admission: RE | Admit: 2019-07-18 | Discharge: 2019-07-18 | Disposition: A | Discharge status: Home / Self Care | Payer: Medicare Other | Source: Ambulatory Visit | Attending: Cardiology | Admitting: Cardiology

## 2019-07-18 DIAGNOSIS — Z20822 Contact with and (suspected) exposure to covid-19: Secondary | ICD-10-CM | POA: Diagnosis not present

## 2019-07-18 DIAGNOSIS — Z01812 Encounter for preprocedural laboratory examination: Secondary | ICD-10-CM | POA: Insufficient documentation

## 2019-07-18 LAB — SARS CORONAVIRUS 2 (TAT 6-24 HRS): SARS Coronavirus 2: NEGATIVE

## 2019-07-21 ENCOUNTER — Ambulatory Visit (HOSPITAL_BASED_OUTPATIENT_CLINIC_OR_DEPARTMENT_OTHER): Payer: Medicare Other | Attending: Cardiology | Admitting: Cardiology

## 2019-07-21 ENCOUNTER — Other Ambulatory Visit: Payer: Self-pay

## 2019-07-21 VITALS — Temp 97.9°F | Ht 67.0 in | Wt 207.0 lb

## 2019-07-21 DIAGNOSIS — G4731 Primary central sleep apnea: Secondary | ICD-10-CM | POA: Diagnosis not present

## 2019-07-21 DIAGNOSIS — G4733 Obstructive sleep apnea (adult) (pediatric): Secondary | ICD-10-CM | POA: Diagnosis present

## 2019-07-21 DIAGNOSIS — R0683 Snoring: Secondary | ICD-10-CM | POA: Diagnosis not present

## 2019-07-22 ENCOUNTER — Other Ambulatory Visit (HOSPITAL_BASED_OUTPATIENT_CLINIC_OR_DEPARTMENT_OTHER): Payer: Self-pay

## 2019-07-22 DIAGNOSIS — G4733 Obstructive sleep apnea (adult) (pediatric): Secondary | ICD-10-CM

## 2019-07-22 NOTE — Procedures (Signed)
    Patient Name: [name], [name] Date: 07/21/2019 Gender: Male D.O.B: 1951/06/26 Age (years): 62 Referring Provider: Armanda Magic MD, ABSM Height (inches): 67 Interpreting Physician: Armanda Magic MD, ABSM Weight (lbs): 207 RPSGT: Ulyess Mort BMI: 32 MRN: [id number] Neck Size: 16.50  CLINICAL INFORMATION The patient is referred for a BiPAP titration to treat sleep apnea.  SLEEP STUDY TECHNIQUE As per the AASM Manual for the Scoring of Sleep and Associated Events v2.3 (April 2016) with a hypopnea requiring 4% desaturations.  The channels recorded and monitored were frontal, central and occipital EEG, electrooculogram (EOG), submentalis EMG (chin), nasal and oral airflow, thoracic and abdominal wall motion, anterior tibialis EMG, snore microphone, electrocardiogram, and pulse oximetry. Bilevel positive airway pressure (BPAP) was initiated at the beginning of the study and titrated to treat sleep-disordered breathing.  MEDICATIONS Medications self-administered by patient taken the night of the study : N/A  RESPIRATORY PARAMETERS Optimal IPAP Pressure (cm): 27  AHI at Optimal Pressure (/hr) 0.0 Optimal EPAP Pressure (cm):23  Overall Minimal O2 (%):73.0  Minimal O2 at Optimal Pressure (%): 93.0  SLEEP ARCHITECTURE Start Time:10:22:01 PM  Stop Time:4:29:05 AM  Total Time (min):367.1  Total Sleep Time (min):237.5 Sleep Latency (min): 5.3  Sleep Efficiency (%): 64.7%  REM Latency (min):173.5  WASO (min): 124.3 Stage N1 (%): 16.6%  Stage N2 (%): 80.0%  Stage N3 (%): 0.0%  Stage R (%):3.4 Supine (%):100.00  Arousal Index (/hr):34.1   CARDIAC DATA The 2 lead EKG demonstrated sinus rhythm. The mean heart rate was 70.2 beats per minute. Other EKG findings include: PVCs.  LEG MOVEMENT DATA The total Periodic Limb Movements of Sleep (PLMS) were 0. The PLMS index was 0.0. A PLMS index of <15 is considered normal in adults.  IMPRESSIONS - An optimal PAP pressure was  selected for this patient ( 27 / cm of water) - Mild Central Sleep Apnea was noted during this titration (CAI = 6.3/h). - Severe oxygen desaturations were observed during this titration (min O2 = 73.0%). - The patient snored with soft snoring volume. - 2-lead EKG demonstrated: PVCs - Clinically significant periodic limb movements were not noted during this study. Arousals associated with PLMs were rare.  DIAGNOSIS - Obstructive Sleep Apnea (327.23 [G47.33 ICD-10])  RECOMMENDATIONS - Trial of auto BiPAP therapy with IPAP max 20cm H2O and EPAP min 5cm H2O and PS 4cm H2O with a Large size Resmed Full Face Mask Mirage Quattro mask and heated humidification. - Avoid alcohol, sedatives and other CNS depressants that may worsen sleep apnea and disrupt normal sleep architecture. - Sleep hygiene should be reviewed to assess factors that may improve sleep quality. - Weight management and regular exercise should be initiated or continued. - Return to Sleep Center for re-evaluation after 10 weeks of therapy  [Electronically signed] 07/22/2019 10:00 PM  Armanda Magic MD, ABSM Diplomate, American Board of Sleep Medicine

## 2019-07-23 ENCOUNTER — Telehealth: Payer: Self-pay | Admitting: *Deleted

## 2019-07-23 NOTE — Telephone Encounter (Signed)
Informed patient of titration results and verbalized understanding was indicated. Patient understands his titration study showed they had a successful PAP titration and let DME know that orders are in EPIC. Please set up 10 week OV with me.   Patient wants to be advised medically from a doctor if he still needs his oxygen.  His titration reads: - Severe oxygen desaturations were observed during this titration (min O2 = 73.0%). Please advise   Upon patient request DME selection is [facility] Patient understands he will be contacted by [facility] to set up his cpap. Patient understands to call if [facility] does not contact him with new setup in a timely manner. Patient understands they will be called once confirmation has been received from [facility] that they have received their new machine to schedule 10 week follow up appointment.  [facility] notified of new cpap order  Please add to airview Patient was grateful for the call and thanked me.

## 2019-07-23 NOTE — Telephone Encounter (Signed)
-----   Message from Quintella Reichert, MD sent at 07/22/2019 10:04 PM EST ----- Please let patient know that they had a successful PAP titration and let DME know that orders are in EPIC.  Please set up 10 week OV with me.

## 2019-07-24 ENCOUNTER — Telehealth: Payer: Self-pay | Admitting: *Deleted

## 2019-07-24 DIAGNOSIS — G4733 Obstructive sleep apnea (adult) (pediatric): Secondary | ICD-10-CM

## 2019-07-24 NOTE — Telephone Encounter (Signed)
-----   Message from Quintella Reichert, MD sent at 07/24/2019  9:07 AM EST ----- Regarding: RE: OXYGEN Please get an overnight pulse ox on BiPAP to see if suppl O2 is needed at night.  Traci ----- Message ----- From: Reesa Chew, CMA Sent: 07/23/2019   6:05 PM EST To: Quintella Reichert, MD Subject: OXYGEN                                         Patient wants to be advised medically from a doctor if he still needs his oxygen.  His titration reads: - Severe oxygen desaturations were observed during this titration (min O2 = 73.0%). Please advise

## 2019-07-27 NOTE — Telephone Encounter (Signed)
Order placed via community message to [facility]. 

## 2019-07-29 ENCOUNTER — Other Ambulatory Visit (HOSPITAL_COMMUNITY): Payer: Self-pay

## 2019-07-29 NOTE — Progress Notes (Signed)
Paramedicine Encounter    Patient ID: [name], male    DOB: 01-07-1951, 69 y.o.   MRN: [id number]    Patient Care Team: Clinic, Medical, MD (Inactive) as PCP - General Sherren Mocha, MD as PCP - Cardiology (Cardiology) Larey Dresser, MD as PCP - Advanced Heart Failure (Cardiology)  Patient Active Problem List   Diagnosis Date Noted  . Hallux limitus of left foot 06/05/2019  . Hallux limitus of right foot 06/05/2019  . Seasonal allergies 02/12/2019  . Health care maintenance 02/12/2019  . GERD (gastroesophageal reflux disease) 02/12/2019  . Nocturnal hypoxemia 02/12/2019  . OSA (obstructive sleep apnea) 02/12/2019  . Pain due to onychomycosis of toenails of both feet 01/30/2019  . Lobar pneumonia ([place]) 12/29/2018  . Diabetes mellitus type 2, controlled, without complications ([place]) 94/85/4627  . Thoracic aortic aneurysm ([place]) 12/29/2018  . Acute on chronic combined systolic and diastolic CHF (congestive heart failure) ([place]) 12/25/2018  . Syncope 12/25/2018  . Chronic systolic heart failure ([place]) 11/19/2018  . Chronic venous insufficiency 06/24/2018  . Respiratory failure ([place]) 06/01/2018  . Leg swelling 05/06/2018  . Acute respiratory failure with hypoxemia ([place]) 10/14/2017  . Influenza-like illness 10/14/2017  . Delirium 10/14/2017  . Acute urinary retention 10/14/2017  . (HFpEF) heart failure with preserved ejection fraction ([place]), pulmonary edema and LBBB of unknown chronicity  10/29/2016  . Elevated troponin I level, presume demand ischemia  10/29/2016  . AKI (acute kidney injury) ([place]) 10/29/2016  . BPH (benign prostatic hyperplasia) 10/29/2016  . Sepsis ([place]) 10/29/2016  . Bacteremia due to Escherichia coli 10/29/2016  . Fatty liver 10/29/2016  . Acute pulmonary edema (HCC)   . Acute hypoxic and hypercarbic respiratory failure (HCC) in setting of bilateral pulmonary infiltrates. Presume CAP + pulmonary edema +/-ALI 10/27/2016  . Left-sided weakness 05/04/2015   . TIA (transient ischemic attack) : Rule out. 05/04/2015  . Hyperlipidemia 05/04/2015  . Chest pain 10/10/2013  . SOB (shortness of breath) 10/10/2013  . Hyperglycemia without ketosis 10/17/2011  . HTN (hypertension) 10/17/2011  . Foot drop, left 10/17/2011  . Impacted cerumen of right ear 10/17/2011  . Dyspnea 10/17/2011  . History of TIA (transient ischemic attack) 10/17/2011  . Neuropathy ([place]) 10/17/2011  . Aortic insufficiency 03/16/2011  . Chest pain 03/16/2011    Current Outpatient Medications:  .  aspirin EC 81 MG tablet, Take 81 mg by mouth daily., Disp: , Rfl:  .  atorvastatin (LIPITOR) 40 MG tablet, Take 1 tablet (40 mg total) by mouth every evening., Disp: 30 tablet, Rfl: 0 .  carvedilol (COREG) 6.25 MG tablet, Take 1.5 tablets (9.375 mg total) by mouth 2 (two) times daily., Disp: 270 tablet, Rfl: 3 .  finasteride (PROSCAR) 5 MG tablet, Take 5 mg by mouth daily., Disp: , Rfl:  .  gabapentin (NEURONTIN) 300 MG capsule, Take 300 mg by mouth 4 (four) times daily. , Disp: , Rfl:  .  hydrOXYzine (VISTARIL) 50 MG capsule, Take 50 mg by mouth 3 (three) times daily., Disp: , Rfl:  .  metFORMIN (GLUCOPHAGE) 1000 MG tablet, Take 1,000 mg by mouth 2 (two) times a day. , Disp: , Rfl:  .  montelukast (SINGULAIR) 10 MG tablet, Take 10 mg by mouth daily. , Disp: , Rfl:  .  Multiple Vitamin (MULTIVITAMIN) tablet, Take 1 tablet by mouth daily., Disp: , Rfl:  .  pantoprazole (PROTONIX) 40 MG tablet, Take 1 tablet (40 mg total) by mouth daily., Disp: 30 tablet, Rfl: 1 .  potassium chloride  SA (KLOR-CON) 20 MEQ tablet, Take 20 mEq by mouth daily. , Disp: , Rfl:  .  tamsulosin (FLOMAX) 0.4 MG CAPS capsule, Take 0.4 mg by mouth daily., Disp: , Rfl:  .  torsemide (DEMADEX) 20 MG tablet, Take 2 tablets (40 mg total) by mouth every morning AND 1 tablet (20 mg total) every evening., Disp: 90 tablet, Rfl: 6 .  Accu-Chek FastClix Lancets MISC, USE UTD, Disp: , Rfl:  .  ACCU-CHEK GUIDE test strip, U  TO TEST BLOOD SUGAR QD, Disp: , Rfl:  .  Blood Glucose Monitoring Suppl (ACCU-CHEK GUIDE) w/Device KIT, U TO TEST BLOOD SUGAR QD, Disp: , Rfl:  .  Dextromethorphan HBr (ROBITUSSIN LINGERING COUGHGELS) 15 MG CAPS, Take 15 mg by mouth daily as needed (cough)., Disp: , Rfl:  Allergies  Allergen Reactions  . Cashew Nut Oil Palpitations  . Percocet [Oxycodone-Acetaminophen] Other (See Comments)    Hallucintaions     Social History   Socioeconomic History  . Marital status: Married    Spouse name: Not on file  . Number of children: Not on file  . Years of education: Not on file  . Highest education level: Not on file  Occupational History  . Not on file  Tobacco Use  . Smoking status: Never Smoker  . Smokeless tobacco: Never Used  Substance and Sexual Activity  . Alcohol use: No  . Drug use: No  . Sexual activity: Not Currently    Partners: Female    Birth control/protection: None  Other Topics Concern  . Not on file  Social History Narrative   Patient lives in private residence with supportive spouse   Social Determinants of Health   Financial Resource Strain:   . Difficulty of Paying Living Expenses: Not on file  Food Insecurity:   . Worried About Charity fundraiser in the Last Year: Not on file  . Ran Out of Food in the Last Year: Not on file  Transportation Needs:   . Lack of Transportation (Medical): Not on file  . Lack of Transportation (Non-Medical): Not on file  Physical Activity:   . Days of Exercise per Week: Not on file  . Minutes of Exercise per Session: Not on file  Stress:   . Feeling of Stress : Not on file  Social Connections:   . Frequency of Communication with Friends and Family: Not on file  . Frequency of Social Gatherings with Friends and Family: Not on file  . Attends Religious Services: Not on file  . Active Member of Clubs or Organizations: Not on file  . Attends Archivist Meetings: Not on file  . Marital Status: Not on file   Intimate Partner Violence:   . Fear of Current or Ex-Partner: Not on file  . Emotionally Abused: Not on file  . Physically Abused: Not on file  . Sexually Abused: Not on file    Physical Exam Pulmonary:     Effort: No respiratory distress.     Breath sounds: No wheezing or rales.  Abdominal:     General: There is no distension.     Tenderness: There is no guarding.  Musculoskeletal:     Right lower leg: Edema present.     Left lower leg: No edema.     Comments: Edema noted to pt's right leg; blister on right leg  Skin:    General: Skin is warm and dry.         Future Appointments  Date Time Provider  [place]  09/01/2019  1:40 PM Larey Dresser, MD [facility] None  09/04/2019  9:30 AM Gardiner Barefoot, DPM [facility]     BP 140/84 (BP Location: Left Arm, Patient Position: Sitting, Cuff Size: Normal)   Pulse (!) 58   Temp 98.6 F (37 C)   Wt 214 lb (97.1 kg)   SpO2 97%   BMI 33.52 kg/m   Weight yesterday-214 Last visit weight-215  ATF pt CAO x4 sitting in the recliner watching tv with his wife. He has an appointment at 11 to get CPAP machine.  He said that he slept good last night, no change in how he's sleeping (additional pillows).  He's taken all of his medications for the past week.  He denies sob, chest pain and dizziness.  Pt has swelling noted to his right leg, with redness (closed womb) that has been treated at home.   rx bottles verified and pill box refilled. He's out of atorvastatin, I wrote an "A" on the pill box on the slots that are missing atorvastatin.  His wife agrees to put them in once the prescription is picked up.   Pt prescribed famotidine.   Medication ordered: Osa Craver (filled until Thursday)  [place], EMT Paramedic [phone] 07/29/2019    ACTION: Home visit completed

## 2019-08-05 ENCOUNTER — Other Ambulatory Visit (HOSPITAL_COMMUNITY): Payer: Self-pay

## 2019-08-05 ENCOUNTER — Other Ambulatory Visit (HOSPITAL_COMMUNITY): Payer: Self-pay | Admitting: Cardiology

## 2019-08-05 MED ORDER — ATORVASTATIN CALCIUM 40 MG PO TABS: 40.0000 mg | ORAL_TABLET | Freq: Every evening | ORAL | 11 refills | Status: AC

## 2019-08-05 NOTE — Progress Notes (Signed)
Paramedicine Encounter    Patient ID: Amery Minasyan, male    DOB: 04-06-1951, 69 y.o.   MRN: [id number]   Patient Care Team: Clinic, Medical, MD (Inactive) as PCP - General Sherren Mocha, MD as PCP - Cardiology (Cardiology) Larey Dresser, MD as PCP - Advanced Heart Failure (Cardiology)  Patient Active Problem List   Diagnosis Date Noted  . Hallux limitus of left foot 06/05/2019  . Hallux limitus of right foot 06/05/2019  . Seasonal allergies 02/12/2019  . Health care maintenance 02/12/2019  . GERD (gastroesophageal reflux disease) 02/12/2019  . Nocturnal hypoxemia 02/12/2019  . OSA (obstructive sleep apnea) 02/12/2019  . Pain due to onychomycosis of toenails of both feet 01/30/2019  . Lobar pneumonia ([place]) 12/29/2018  . Diabetes mellitus type 2, controlled, without complications ([place]) 78/93/8101  . Thoracic aortic aneurysm ([place]) 12/29/2018  . Acute on chronic combined systolic and diastolic CHF (congestive heart failure) ([place]) 12/25/2018  . Syncope 12/25/2018  . Chronic systolic heart failure ([place]) 11/19/2018  . Chronic venous insufficiency 06/24/2018  . Respiratory failure ([place]) 06/01/2018  . Leg swelling 05/06/2018  . Acute respiratory failure with hypoxemia ([place]) 10/14/2017  . Influenza-like illness 10/14/2017  . Delirium 10/14/2017  . Acute urinary retention 10/14/2017  . (HFpEF) heart failure with preserved ejection fraction ([place]), pulmonary edema and LBBB of unknown chronicity  10/29/2016  . Elevated troponin I level, presume demand ischemia  10/29/2016  . AKI (acute kidney injury) ([place]) 10/29/2016  . BPH (benign prostatic hyperplasia) 10/29/2016  . Sepsis ([place]) 10/29/2016  . Bacteremia due to Escherichia coli 10/29/2016  . Fatty liver 10/29/2016  . Acute pulmonary edema (HCC)   . Acute hypoxic and hypercarbic respiratory failure (HCC) in setting of bilateral pulmonary infiltrates. Presume CAP + pulmonary edema +/-ALI 10/27/2016  . Left-sided weakness 05/04/2015  .  TIA (transient ischemic attack) : Rule out. 05/04/2015  . Hyperlipidemia 05/04/2015  . Chest pain 10/10/2013  . SOB (shortness of breath) 10/10/2013  . Hyperglycemia without ketosis 10/17/2011  . HTN (hypertension) 10/17/2011  . Foot drop, left 10/17/2011  . Impacted cerumen of right ear 10/17/2011  . Dyspnea 10/17/2011  . History of TIA (transient ischemic attack) 10/17/2011  . Neuropathy ([place]) 10/17/2011  . Aortic insufficiency 03/16/2011  . Chest pain 03/16/2011    Current Outpatient Medications:  .  Accu-Chek FastClix Lancets MISC, USE UTD, Disp: , Rfl:  .  ACCU-CHEK GUIDE test strip, U TO TEST BLOOD SUGAR QD, Disp: , Rfl:  .  aspirin EC 81 MG tablet, Take 81 mg by mouth daily., Disp: , Rfl:  .  Blood Glucose Monitoring Suppl (ACCU-CHEK GUIDE) w/Device KIT, U TO TEST BLOOD SUGAR QD, Disp: , Rfl:  .  carvedilol (COREG) 6.25 MG tablet, Take 1.5 tablets (9.375 mg total) by mouth 2 (two) times daily., Disp: 270 tablet, Rfl: 3 .  famotidine (PEPCID) 20 MG tablet, Take 20 mg by mouth at bedtime., Disp: , Rfl:  .  finasteride (PROSCAR) 5 MG tablet, Take 5 mg by mouth daily., Disp: , Rfl:  .  gabapentin (NEURONTIN) 300 MG capsule, Take 300 mg by mouth 4 (four) times daily. , Disp: , Rfl:  .  hydrOXYzine (VISTARIL) 50 MG capsule, Take 50 mg by mouth 3 (three) times daily., Disp: , Rfl:  .  metFORMIN (GLUCOPHAGE) 1000 MG tablet, Take 1,000 mg by mouth 2 (two) times a day. , Disp: , Rfl:  .  montelukast (SINGULAIR) 10 MG tablet, Take 10 mg by mouth daily. , Disp: ,  Rfl:  .  Multiple Vitamin (MULTIVITAMIN) tablet, Take 1 tablet by mouth daily., Disp: , Rfl:  .  pantoprazole (PROTONIX) 40 MG tablet, Take 1 tablet (40 mg total) by mouth daily., Disp: 30 tablet, Rfl: 1 .  potassium chloride SA (KLOR-CON) 20 MEQ tablet, Take 20 mEq by mouth daily. , Disp: , Rfl:  .  tamsulosin (FLOMAX) 0.4 MG CAPS capsule, Take 0.4 mg by mouth daily., Disp: , Rfl:  .  torsemide (DEMADEX) 20 MG tablet, Take 2  tablets (40 mg total) by mouth every morning AND 1 tablet (20 mg total) every evening., Disp: 90 tablet, Rfl: 6 .  atorvastatin (LIPITOR) 40 MG tablet, Take 1 tablet (40 mg total) by mouth every evening., Disp: 30 tablet, Rfl: 11 .  Dextromethorphan HBr (ROBITUSSIN LINGERING COUGHGELS) 15 MG CAPS, Take 15 mg by mouth daily as needed (cough)., Disp: , Rfl:  Allergies  Allergen Reactions  . Cashew Nut Oil Palpitations  . Percocet [Oxycodone-Acetaminophen] Other (See Comments)    Hallucintaions      Social History   Socioeconomic History  . Marital status: Married    Spouse name: Not on file  . Number of children: Not on file  . Years of education: Not on file  . Highest education level: Not on file  Occupational History  . Not on file  Tobacco Use  . Smoking status: Never Smoker  . Smokeless tobacco: Never Used  Substance and Sexual Activity  . Alcohol use: No  . Drug use: No  . Sexual activity: Not Currently    Partners: Female    Birth control/protection: None  Other Topics Concern  . Not on file  Social History Narrative   Patient lives in private residence with supportive spouse   Social Determinants of Health   Financial Resource Strain:   . Difficulty of Paying Living Expenses: Not on file  Food Insecurity:   . Worried About Charity fundraiser in the Last Year: Not on file  . Ran Out of Food in the Last Year: Not on file  Transportation Needs:   . Lack of Transportation (Medical): Not on file  . Lack of Transportation (Non-Medical): Not on file  Physical Activity:   . Days of Exercise per Week: Not on file  . Minutes of Exercise per Session: Not on file  Stress:   . Feeling of Stress : Not on file  Social Connections:   . Frequency of Communication with Friends and Family: Not on file  . Frequency of Social Gatherings with Friends and Family: Not on file  . Attends Religious Services: Not on file  . Active Member of Clubs or Organizations: Not on file  .  Attends Archivist Meetings: Not on file  . Marital Status: Not on file  Intimate Partner Violence:   . Fear of Current or Ex-Partner: Not on file  . Emotionally Abused: Not on file  . Physically Abused: Not on file  . Sexually Abused: Not on file    Physical Exam      Future Appointments  Date Time Provider [place]  09/01/2019  1:40 PM Larey Dresser, MD [facility] None  09/04/2019  9:30 AM Gardiner Barefoot, DPM [facility]    BP 124/74   Pulse 64   Temp 97.7 F (36.5 C)   Resp 18   Wt 213 lb (96.6 kg)   SpO2 97%   BMI 33.36 kg/m  CBG EMS-206  Weight yesterday-213  Last visit weight-214  Pt reports he is doing well. He denies sob, no dizziness, no c/p.  Weight been stable at home.  Wife reports that he did the overnight 02 test and the probe came off his finger. Also family wasn't sure why he needed to do this again, spoke to [place] and she reports that either it could be an insurance requirement or the study didn't get the info that was needed so that info given to family. Called the 1-800 # for [facility] to see if they have the data needed but had to LVM and left the number to call the family back for further instructions.   meds verified and pill box refilled. He is out of ASA. They will go out soon to get it but the weather has been so cold they havent went out.  He did not get the atorvastatin. She said pharmacy called yesterday and she will get it and place it in pill box.  Also needs refill of potassium. Called pharmacy to ensure they have the ator and pot.  Asked chantel to send it in for him and they will send it to pharmacy.   [name], [facility] Paramedic  08/06/19

## 2019-08-12 ENCOUNTER — Telehealth (HOSPITAL_COMMUNITY): Payer: Self-pay | Admitting: Cardiology

## 2019-08-12 ENCOUNTER — Other Ambulatory Visit (HOSPITAL_COMMUNITY): Payer: Self-pay

## 2019-08-12 DIAGNOSIS — I5022 Chronic systolic (congestive) heart failure: Secondary | ICD-10-CM

## 2019-08-12 MED ORDER — TORSEMIDE 20 MG PO TABS: 40.0000 mg | ORAL_TABLET | Freq: Two times a day (BID) | ORAL | 6 refills | Status: DC

## 2019-08-12 NOTE — Progress Notes (Signed)
Paramedicine Encounter    Patient ID: [name], male    DOB: October 08, 1950, 69 y.o.   MRN: [id number]   Patient Care Team: Clinic, Medical, MD (Inactive) as PCP - General Sherren Mocha, MD as PCP - Cardiology (Cardiology) Larey Dresser, MD as PCP - Advanced Heart Failure (Cardiology)  Patient Active Problem List   Diagnosis Date Noted  . Hallux limitus of left foot 06/05/2019  . Hallux limitus of right foot 06/05/2019  . Seasonal allergies 02/12/2019  . Health care maintenance 02/12/2019  . GERD (gastroesophageal reflux disease) 02/12/2019  . Nocturnal hypoxemia 02/12/2019  . OSA (obstructive sleep apnea) 02/12/2019  . Pain due to onychomycosis of toenails of both feet 01/30/2019  . Lobar pneumonia ([place]) 12/29/2018  . Diabetes mellitus type 2, controlled, without complications ([place]) 98/92/1194  . Thoracic aortic aneurysm ([place]) 12/29/2018  . Acute on chronic combined systolic and diastolic CHF (congestive heart failure) ([place]) 12/25/2018  . Syncope 12/25/2018  . Chronic systolic heart failure ([place]) 11/19/2018  . Chronic venous insufficiency 06/24/2018  . Respiratory failure ([place]) 06/01/2018  . Leg swelling 05/06/2018  . Acute respiratory failure with hypoxemia ([place]) 10/14/2017  . Influenza-like illness 10/14/2017  . Delirium 10/14/2017  . Acute urinary retention 10/14/2017  . (HFpEF) heart failure with preserved ejection fraction ([place]), pulmonary edema and LBBB of unknown chronicity  10/29/2016  . Elevated troponin I level, presume demand ischemia  10/29/2016  . AKI (acute kidney injury) ([place]) 10/29/2016  . BPH (benign prostatic hyperplasia) 10/29/2016  . Sepsis ([place]) 10/29/2016  . Bacteremia due to Escherichia coli 10/29/2016  . Fatty liver 10/29/2016  . Acute pulmonary edema (HCC)   . Acute hypoxic and hypercarbic respiratory failure (HCC) in setting of bilateral pulmonary infiltrates. Presume CAP + pulmonary edema +/-ALI 10/27/2016  . Left-sided weakness 05/04/2015  .  TIA (transient ischemic attack) : Rule out. 05/04/2015  . Hyperlipidemia 05/04/2015  . Chest pain 10/10/2013  . SOB (shortness of breath) 10/10/2013  . Hyperglycemia without ketosis 10/17/2011  . HTN (hypertension) 10/17/2011  . Foot drop, left 10/17/2011  . Impacted cerumen of right ear 10/17/2011  . Dyspnea 10/17/2011  . History of TIA (transient ischemic attack) 10/17/2011  . Neuropathy ([place]) 10/17/2011  . Aortic insufficiency 03/16/2011  . Chest pain 03/16/2011    Current Outpatient Medications:  .  Accu-Chek FastClix Lancets MISC, USE UTD, Disp: , Rfl:  .  ACCU-CHEK GUIDE test strip, U TO TEST BLOOD SUGAR QD, Disp: , Rfl:  .  aspirin EC 81 MG tablet, Take 81 mg by mouth daily., Disp: , Rfl:  .  atorvastatin (LIPITOR) 40 MG tablet, Take 1 tablet (40 mg total) by mouth every evening., Disp: 30 tablet, Rfl: 11 .  Blood Glucose Monitoring Suppl (ACCU-CHEK GUIDE) w/Device KIT, U TO TEST BLOOD SUGAR QD, Disp: , Rfl:  .  carvedilol (COREG) 6.25 MG tablet, Take 1.5 tablets (9.375 mg total) by mouth 2 (two) times daily., Disp: 270 tablet, Rfl: 3 .  Dextromethorphan HBr (ROBITUSSIN LINGERING COUGHGELS) 15 MG CAPS, Take 15 mg by mouth daily as needed (cough)., Disp: , Rfl:  .  famotidine (PEPCID) 20 MG tablet, Take 20 mg by mouth at bedtime., Disp: , Rfl:  .  finasteride (PROSCAR) 5 MG tablet, Take 5 mg by mouth daily., Disp: , Rfl:  .  gabapentin (NEURONTIN) 300 MG capsule, Take 300 mg by mouth 4 (four) times daily. , Disp: , Rfl:  .  hydrOXYzine (VISTARIL) 50 MG capsule, Take 50 mg by mouth 3 (  three) times daily., Disp: , Rfl:  .  metFORMIN (GLUCOPHAGE) 1000 MG tablet, Take 1,000 mg by mouth 2 (two) times a day. , Disp: , Rfl:  .  montelukast (SINGULAIR) 10 MG tablet, Take 10 mg by mouth daily. , Disp: , Rfl:  .  Multiple Vitamin (MULTIVITAMIN) tablet, Take 1 tablet by mouth daily., Disp: , Rfl:  .  pantoprazole (PROTONIX) 40 MG tablet, Take 1 tablet (40 mg total) by mouth daily., Disp: 30  tablet, Rfl: 1 .  potassium chloride SA (KLOR-CON) 20 MEQ tablet, Take 20 mEq by mouth daily. , Disp: , Rfl:  .  tamsulosin (FLOMAX) 0.4 MG CAPS capsule, Take 0.4 mg by mouth daily., Disp: , Rfl:  .  torsemide (DEMADEX) 20 MG tablet, Take 2 tablets (40 mg total) by mouth every morning AND 1 tablet (20 mg total) every evening., Disp: 90 tablet, Rfl: 6 Allergies  Allergen Reactions  . Cashew Nut Oil Palpitations  . Percocet [Oxycodone-Acetaminophen] Other (See Comments)    Hallucintaions      Social History   Socioeconomic History  . Marital status: Married    Spouse name: Not on file  . Number of children: Not on file  . Years of education: Not on file  . Highest education level: Not on file  Occupational History  . Not on file  Tobacco Use  . Smoking status: Never Smoker  . Smokeless tobacco: Never Used  Substance and Sexual Activity  . Alcohol use: No  . Drug use: No  . Sexual activity: Not Currently    Partners: Female    Birth control/protection: None  Other Topics Concern  . Not on file  Social History Narrative   Patient lives in private residence with supportive spouse   Social Determinants of Health   Financial Resource Strain:   . Difficulty of Paying Living Expenses: Not on file  Food Insecurity:   . Worried About Charity fundraiser in the Last Year: Not on file  . Ran Out of Food in the Last Year: Not on file  Transportation Needs:   . Lack of Transportation (Medical): Not on file  . Lack of Transportation (Non-Medical): Not on file  Physical Activity:   . Days of Exercise per Week: Not on file  . Minutes of Exercise per Session: Not on file  Stress:   . Feeling of Stress : Not on file  Social Connections:   . Frequency of Communication with Friends and Family: Not on file  . Frequency of Social Gatherings with Friends and Family: Not on file  . Attends Religious Services: Not on file  . Active Member of Clubs or Organizations: Not on file  .  Attends Archivist Meetings: Not on file  . Marital Status: Not on file  Intimate Partner Violence:   . Fear of Current or Ex-Partner: Not on file  . Emotionally Abused: Not on file  . Physically Abused: Not on file  . Sexually Abused: Not on file    Physical Exam      Future Appointments  Date Time Provider [place]  09/01/2019  1:40 PM Larey Dresser, MD [facility] None  09/04/2019  9:30 AM Gardiner Barefoot, DPM [facility]    BP (!) 144/78   Pulse 74   Temp 97.7 F (36.5 C)   Resp 18   Wt 217 lb (98.4 kg)   SpO2 95%   BMI 33.99 kg/m  CBG EMS-216 Weight yesterday-216  Last visit weight-213  Pt reports his weight is up 4lbs from last week. Spoke to chantel and she is going to speak to amy and they will call me back.  He had a lot of cheat days this week and diet indiscretions. He has been drinking a lot of OJ, ate at [employer], cakes at home from his wifes birthday.  No missed doses of his meds.  He states he does have increased sob while laying flat or bending over, he is compliant with CPAP. And also productive cough of phlegm.  His abd is distended. He said he has been eating a lot of potato chips. So I advised him to be cautious with his sodium intake and limiting his fluids.  He said he would. He is also having issues with his cardiomems and not wanting to read the date, wife seems to think its too much interference with his 02 machine and his CPAP machine-this has started since he got the CPAP machine. Which my computer has issues here as well with signals a lot, I suggested them try to move it to the living room and try to do a reading in there and see if it changes. If not will follow up with clinic.  discrepancy  between epic metformin dosage and his pill bottle--pill bottle and last rx pharmacy has on file--579m BID per rx bottle and epic list 10053mBID.  LVM for pts PCP [facility] to return my call regarding dosing.    KaMarylouise StacksEMGlen [facility]ado[facility]amedic  08/12/19

## 2019-08-12 NOTE — Telephone Encounter (Signed)
Patient aware and voiced understanding Repeat labs 2/17 @ 930

## 2019-08-12 NOTE — Telephone Encounter (Signed)
[name] with para medicine called to report increased weight (up 4 lbs since last week), increased SOB, abdominal distension and edema.  Reports he was somewhat relaxed on his dietary restrictions over the past week, however compliant with medication regimen (torsemide 40/20)  B/p today 144/78 Weight 217 lb Follow up 3/2 with Dr Shirlee Latch  Labs 06/17/19 Cr 1.51 BUN 28 K 4.1   Please advise further

## 2019-08-12 NOTE — Telephone Encounter (Signed)
He has Cardiomems, need to check that. In the mean time, can increase torsemide to 40 mg bid with BMET 1 week.

## 2019-08-18 ENCOUNTER — Telehealth: Payer: Self-pay | Admitting: *Deleted

## 2019-08-18 DIAGNOSIS — R0602 Shortness of breath: Secondary | ICD-10-CM

## 2019-08-18 DIAGNOSIS — G4734 Idiopathic sleep related nonobstructive alveolar hypoventilation: Secondary | ICD-10-CM

## 2019-08-18 DIAGNOSIS — G4733 Obstructive sleep apnea (adult) (pediatric): Secondary | ICD-10-CM

## 2019-08-18 NOTE — Telephone Encounter (Signed)
-----   Message from Traci R Turner, MD sent at 08/12/2019  4:22 PM EST ----- Pulse ox confirms continued nocturnal hypoxemia despite BIpAP, start O2 at 2L via BiPAP and get repeat overnight pulse ox 

## 2019-08-18 NOTE — Telephone Encounter (Signed)
Please get a download from DME to see if BiPAP is adequately titrated prior to increasing O2.

## 2019-08-18 NOTE — Telephone Encounter (Signed)
Informed patient of pulse oximetry results and verbalized understanding was indicated. [name] states the patient was on 2 L 02 on his overnight pulse ox test. She states she was too afraid to let him sleep without any oxygen. So he tested with oxygen during the pulse oximetry test. Please advise

## 2019-08-18 NOTE — Telephone Encounter (Deleted)
-----   Message from Quintella Reichert, MD sent at 08/12/2019  4:22 PM EST ----- Pulse ox confirms continued nocturnal hypoxemia despite BIpAP, start O2 at 2L via BiPAP and get repeat overnight pulse ox

## 2019-08-19 ENCOUNTER — Ambulatory Visit (HOSPITAL_COMMUNITY)
Admission: RE | Admit: 2019-08-19 | Discharge: 2019-08-19 | Disposition: A | Discharge status: Home / Self Care | Payer: Medicare Other | Source: Ambulatory Visit | Attending: Internal Medicine | Admitting: Internal Medicine

## 2019-08-19 ENCOUNTER — Other Ambulatory Visit (HOSPITAL_COMMUNITY): Payer: Self-pay

## 2019-08-19 ENCOUNTER — Other Ambulatory Visit: Payer: Self-pay

## 2019-08-19 DIAGNOSIS — I5022 Chronic systolic (congestive) heart failure: Secondary | ICD-10-CM

## 2019-08-19 LAB — BASIC METABOLIC PANEL
Anion gap: 13 (ref 5–15)
BUN: 28 mg/dL — ABNORMAL HIGH (ref 8–23)
CO2: 32 mmol/L (ref 22–32)
Calcium: 8.9 mg/dL (ref 8.9–10.3)
Chloride: 96 mmol/L — ABNORMAL LOW (ref 98–111)
Creatinine, Ser: 1.9 mg/dL — ABNORMAL HIGH (ref 0.61–1.24)
GFR calc Af Amer: 41 mL/min — ABNORMAL LOW (ref >60)
GFR calc non Af Amer: 35 mL/min — ABNORMAL LOW (ref >60)
Glucose, Bld: 239 mg/dL — ABNORMAL HIGH (ref 70–99)
Potassium: 4.1 mmol/L (ref 3.5–5.1)
Sodium: 141 mmol/L (ref 135–145)

## 2019-08-19 NOTE — Progress Notes (Signed)
Paramedicine Encounter    Patient ID: [name], male    DOB: October 08, 1950, 69 y.o.   MRN: [id number]   Patient Care Team: Clinic, Medical, MD (Inactive) as PCP - General Sherren Mocha, MD as PCP - Cardiology (Cardiology) Larey Dresser, MD as PCP - Advanced Heart Failure (Cardiology)  Patient Active Problem List   Diagnosis Date Noted  . Hallux limitus of left foot 06/05/2019  . Hallux limitus of right foot 06/05/2019  . Seasonal allergies 02/12/2019  . Health care maintenance 02/12/2019  . GERD (gastroesophageal reflux disease) 02/12/2019  . Nocturnal hypoxemia 02/12/2019  . OSA (obstructive sleep apnea) 02/12/2019  . Pain due to onychomycosis of toenails of both feet 01/30/2019  . Lobar pneumonia ([place]) 12/29/2018  . Diabetes mellitus type 2, controlled, without complications ([place]) 98/92/1194  . Thoracic aortic aneurysm ([place]) 12/29/2018  . Acute on chronic combined systolic and diastolic CHF (congestive heart failure) ([place]) 12/25/2018  . Syncope 12/25/2018  . Chronic systolic heart failure ([place]) 11/19/2018  . Chronic venous insufficiency 06/24/2018  . Respiratory failure ([place]) 06/01/2018  . Leg swelling 05/06/2018  . Acute respiratory failure with hypoxemia ([place]) 10/14/2017  . Influenza-like illness 10/14/2017  . Delirium 10/14/2017  . Acute urinary retention 10/14/2017  . (HFpEF) heart failure with preserved ejection fraction ([place]), pulmonary edema and LBBB of unknown chronicity  10/29/2016  . Elevated troponin I level, presume demand ischemia  10/29/2016  . AKI (acute kidney injury) ([place]) 10/29/2016  . BPH (benign prostatic hyperplasia) 10/29/2016  . Sepsis ([place]) 10/29/2016  . Bacteremia due to Escherichia coli 10/29/2016  . Fatty liver 10/29/2016  . Acute pulmonary edema (HCC)   . Acute hypoxic and hypercarbic respiratory failure (HCC) in setting of bilateral pulmonary infiltrates. Presume CAP + pulmonary edema +/-ALI 10/27/2016  . Left-sided weakness 05/04/2015  .  TIA (transient ischemic attack) : Rule out. 05/04/2015  . Hyperlipidemia 05/04/2015  . Chest pain 10/10/2013  . SOB (shortness of breath) 10/10/2013  . Hyperglycemia without ketosis 10/17/2011  . HTN (hypertension) 10/17/2011  . Foot drop, left 10/17/2011  . Impacted cerumen of right ear 10/17/2011  . Dyspnea 10/17/2011  . History of TIA (transient ischemic attack) 10/17/2011  . Neuropathy ([place]) 10/17/2011  . Aortic insufficiency 03/16/2011  . Chest pain 03/16/2011    Current Outpatient Medications:  .  Accu-Chek FastClix Lancets MISC, USE UTD, Disp: , Rfl:  .  ACCU-CHEK GUIDE test strip, U TO TEST BLOOD SUGAR QD, Disp: , Rfl:  .  aspirin EC 81 MG tablet, Take 81 mg by mouth daily., Disp: , Rfl:  .  atorvastatin (LIPITOR) 40 MG tablet, Take 1 tablet (40 mg total) by mouth every evening., Disp: 30 tablet, Rfl: 11 .  Blood Glucose Monitoring Suppl (ACCU-CHEK GUIDE) w/Device KIT, U TO TEST BLOOD SUGAR QD, Disp: , Rfl:  .  carvedilol (COREG) 6.25 MG tablet, Take 1.5 tablets (9.375 mg total) by mouth 2 (two) times daily., Disp: 270 tablet, Rfl: 3 .  Dextromethorphan HBr (ROBITUSSIN LINGERING COUGHGELS) 15 MG CAPS, Take 15 mg by mouth daily as needed (cough)., Disp: , Rfl:  .  famotidine (PEPCID) 20 MG tablet, Take 20 mg by mouth at bedtime., Disp: , Rfl:  .  finasteride (PROSCAR) 5 MG tablet, Take 5 mg by mouth daily., Disp: , Rfl:  .  gabapentin (NEURONTIN) 300 MG capsule, Take 300 mg by mouth 4 (four) times daily. , Disp: , Rfl:  .  hydrOXYzine (VISTARIL) 50 MG capsule, Take 50 mg by mouth 3 (  three) times daily., Disp: , Rfl:  .  metFORMIN (GLUCOPHAGE) 1000 MG tablet, Take 1,000 mg by mouth 2 (two) times a day. , Disp: , Rfl:  .  montelukast (SINGULAIR) 10 MG tablet, Take 10 mg by mouth daily. , Disp: , Rfl:  .  Multiple Vitamin (MULTIVITAMIN) tablet, Take 1 tablet by mouth daily., Disp: , Rfl:  .  pantoprazole (PROTONIX) 40 MG tablet, Take 1 tablet (40 mg total) by mouth daily., Disp: 30  tablet, Rfl: 1 .  potassium chloride SA (KLOR-CON) 20 MEQ tablet, Take 20 mEq by mouth daily. , Disp: , Rfl:  .  tamsulosin (FLOMAX) 0.4 MG CAPS capsule, Take 0.4 mg by mouth daily., Disp: , Rfl:  .  torsemide (DEMADEX) 20 MG tablet, Take 2 tablets (40 mg total) by mouth 2 (two) times daily., Disp: 120 tablet, Rfl: 6 Allergies  Allergen Reactions  . Cashew Nut Oil Palpitations  . Percocet [Oxycodone-Acetaminophen] Other (See Comments)    Hallucintaions      Social History   Socioeconomic History  . Marital status: Married    Spouse name: Not on file  . Number of children: Not on file  . Years of education: Not on file  . Highest education level: Not on file  Occupational History  . Not on file  Tobacco Use  . Smoking status: Never Smoker  . Smokeless tobacco: Never Used  Substance and Sexual Activity  . Alcohol use: No  . Drug use: No  . Sexual activity: Not Currently    Partners: Female    Birth control/protection: None  Other Topics Concern  . Not on file  Social History Narrative   Patient lives in private residence with supportive spouse   Social Determinants of Health   Financial Resource Strain:   . Difficulty of Paying Living Expenses: Not on file  Food Insecurity:   . Worried About Charity fundraiser in the Last Year: Not on file  . Ran Out of Food in the Last Year: Not on file  Transportation Needs:   . Lack of Transportation (Medical): Not on file  . Lack of Transportation (Non-Medical): Not on file  Physical Activity:   . Days of Exercise per Week: Not on file  . Minutes of Exercise per Session: Not on file  Stress:   . Feeling of Stress : Not on file  Social Connections:   . Frequency of Communication with Friends and Family: Not on file  . Frequency of Social Gatherings with Friends and Family: Not on file  . Attends Religious Services: Not on file  . Active Member of Clubs or Organizations: Not on file  . Attends Archivist Meetings:  Not on file  . Marital Status: Not on file  Intimate Partner Violence:   . Fear of Current or Ex-Partner: Not on file  . Emotionally Abused: Not on file  . Physically Abused: Not on file  . Sexually Abused: Not on file    Physical Exam      Future Appointments  Date Time Provider [place]  09/01/2019  1:40 PM Larey Dresser, MD [facility] None  09/04/2019  9:30 AM Gardiner Barefoot, DPM [facility]    BP 126/78   Pulse 72   Temp 97.8 F (36.6 C)   Resp 18   Wt 212 lb (96.2 kg)   SpO2 97%   BMI 33.20 kg/m   Weight yesterday-212 Last visit weight-217  Pt states he is doing ok, he has been  having trouble getting his cardiomems to work. I suggested that they call the 1-800 number on the equipment to start there to see if a tech will come out to trouble shoot.  I told her if that doesn't work then to let me know and I will ask clinic for further.  Pt had labs done this morning.  I never heard anything back from [facility] nor did the family about his metformin. Wife states they will just drive there and walk in and discuss this and get it straight.  Pts wife is not open to him changing PCP even though they are not getting the best care and response from them but due to her gut instinct she doesn't want to change, pt wants to change but she isnt going to let him-she even goes there and wants to change but she isnt going to either.  meds verified and pill box refilled.  Ordered the hydroxyzine for refill. He also needs ventolin refilled.  Pt states he is doing good, no increased sob, no dizziness, edema better. His legs are wrapped.     [name], [facility] Paramedic  08/19/19

## 2019-08-19 NOTE — Telephone Encounter (Signed)
Download printed and sent to be scanned 

## 2019-08-21 NOTE — Telephone Encounter (Signed)
Patient's download shows an AHI too high at 12.2 on Auto and only 23% compliant with using PAP > 4 hours. Please find out why patient is not compliant.  Please verify what mask patient is using an if it is leaking and if he is sleeping supine

## 2019-08-26 ENCOUNTER — Other Ambulatory Visit (HOSPITAL_COMMUNITY): Payer: Self-pay

## 2019-08-26 NOTE — Progress Notes (Addendum)
Paramedicine Encounter    Patient ID: [name], male    DOB: June 05, 1951, 69 y.o.   MRN: [id number]   Patient Care Team: Clinic, Medical, MD (Inactive) as PCP - General Sherren Mocha, MD as PCP - Cardiology (Cardiology) Larey Dresser, MD as PCP - Advanced Heart Failure (Cardiology)  Patient Active Problem List   Diagnosis Date Noted  . Hallux limitus of left foot 06/05/2019  . Hallux limitus of right foot 06/05/2019  . Seasonal allergies 02/12/2019  . Health care maintenance 02/12/2019  . GERD (gastroesophageal reflux disease) 02/12/2019  . Nocturnal hypoxemia 02/12/2019  . OSA (obstructive sleep apnea) 02/12/2019  . Pain due to onychomycosis of toenails of both feet 01/30/2019  . Lobar pneumonia ([place]) 12/29/2018  . Diabetes mellitus type 2, controlled, without complications ([place]) 67/59/1638  . Thoracic aortic aneurysm ([place]) 12/29/2018  . Acute on chronic combined systolic and diastolic CHF (congestive heart failure) ([place]) 12/25/2018  . Syncope 12/25/2018  . Chronic systolic heart failure ([place]) 11/19/2018  . Chronic venous insufficiency 06/24/2018  . Respiratory failure ([place]) 06/01/2018  . Leg swelling 05/06/2018  . Acute respiratory failure with hypoxemia ([place]) 10/14/2017  . Influenza-like illness 10/14/2017  . Delirium 10/14/2017  . Acute urinary retention 10/14/2017  . (HFpEF) heart failure with preserved ejection fraction ([place]), pulmonary edema and LBBB of unknown chronicity  10/29/2016  . Elevated troponin I level, presume demand ischemia  10/29/2016  . AKI (acute kidney injury) ([place]) 10/29/2016  . BPH (benign prostatic hyperplasia) 10/29/2016  . Sepsis ([place]) 10/29/2016  . Bacteremia due to Escherichia coli 10/29/2016  . Fatty liver 10/29/2016  . Acute pulmonary edema (HCC)   . Acute hypoxic and hypercarbic respiratory failure (HCC) in setting of bilateral pulmonary infiltrates. Presume CAP + pulmonary edema +/-ALI 10/27/2016  . Left-sided weakness 05/04/2015  .  TIA (transient ischemic attack) : Rule out. 05/04/2015  . Hyperlipidemia 05/04/2015  . Chest pain 10/10/2013  . SOB (shortness of breath) 10/10/2013  . Hyperglycemia without ketosis 10/17/2011  . HTN (hypertension) 10/17/2011  . Foot drop, left 10/17/2011  . Impacted cerumen of right ear 10/17/2011  . Dyspnea 10/17/2011  . History of TIA (transient ischemic attack) 10/17/2011  . Neuropathy ([place]) 10/17/2011  . Aortic insufficiency 03/16/2011  . Chest pain 03/16/2011    Current Outpatient Medications:  .  Accu-Chek FastClix Lancets MISC, USE UTD, Disp: , Rfl:  .  ACCU-CHEK GUIDE test strip, U TO TEST BLOOD SUGAR QD, Disp: , Rfl:  .  aspirin EC 81 MG tablet, Take 81 mg by mouth daily., Disp: , Rfl:  .  atorvastatin (LIPITOR) 40 MG tablet, Take 1 tablet (40 mg total) by mouth every evening., Disp: 30 tablet, Rfl: 11 .  Blood Glucose Monitoring Suppl (ACCU-CHEK GUIDE) w/Device KIT, U TO TEST BLOOD SUGAR QD, Disp: , Rfl:  .  carvedilol (COREG) 6.25 MG tablet, Take 1.5 tablets (9.375 mg total) by mouth 2 (two) times daily., Disp: 270 tablet, Rfl: 3 .  famotidine (PEPCID) 20 MG tablet, Take 20 mg by mouth at bedtime., Disp: , Rfl:  .  finasteride (PROSCAR) 5 MG tablet, Take 5 mg by mouth daily., Disp: , Rfl:  .  gabapentin (NEURONTIN) 300 MG capsule, Take 300 mg by mouth 4 (four) times daily. , Disp: , Rfl:  .  hydrOXYzine (VISTARIL) 50 MG capsule, Take 50 mg by mouth at bedtime. , Disp: , Rfl:  .  metFORMIN (GLUCOPHAGE) 1000 MG tablet, Take 1,000 mg by mouth 2 (two) times a day. ,  Disp: , Rfl:  .  montelukast (SINGULAIR) 10 MG tablet, Take 10 mg by mouth daily. , Disp: , Rfl:  .  Multiple Vitamin (MULTIVITAMIN) tablet, Take 1 tablet by mouth daily., Disp: , Rfl:  .  pantoprazole (PROTONIX) 40 MG tablet, Take 1 tablet (40 mg total) by mouth daily., Disp: 30 tablet, Rfl: 1 .  potassium chloride SA (KLOR-CON) 20 MEQ tablet, Take 20 mEq by mouth daily. , Disp: , Rfl:  .  tamsulosin (FLOMAX) 0.4  MG CAPS capsule, Take 0.4 mg by mouth daily., Disp: , Rfl:  .  torsemide (DEMADEX) 20 MG tablet, Take 2 tablets (40 mg total) by mouth 2 (two) times daily., Disp: 120 tablet, Rfl: 6 .  Dextromethorphan HBr (ROBITUSSIN LINGERING COUGHGELS) 15 MG CAPS, Take 15 mg by mouth daily as needed (cough)., Disp: , Rfl:  Allergies  Allergen Reactions  . Cashew Nut Oil Palpitations  . Percocet [Oxycodone-Acetaminophen] Other (See Comments)    Hallucintaions      Social History   Socioeconomic History  . Marital status: Married    Spouse name: Not on file  . Number of children: Not on file  . Years of education: Not on file  . Highest education level: Not on file  Occupational History  . Not on file  Tobacco Use  . Smoking status: Never Smoker  . Smokeless tobacco: Never Used  Substance and Sexual Activity  . Alcohol use: No  . Drug use: No  . Sexual activity: Not Currently    Partners: Female    Birth control/protection: None  Other Topics Concern  . Not on file  Social History Narrative   Patient lives in private residence with supportive spouse   Social Determinants of Health   Financial Resource Strain:   . Difficulty of Paying Living Expenses: Not on file  Food Insecurity:   . Worried About Charity fundraiser in the Last Year: Not on file  . Ran Out of Food in the Last Year: Not on file  Transportation Needs:   . Lack of Transportation (Medical): Not on file  . Lack of Transportation (Non-Medical): Not on file  Physical Activity:   . Days of Exercise per Week: Not on file  . Minutes of Exercise per Session: Not on file  Stress:   . Feeling of Stress : Not on file  Social Connections:   . Frequency of Communication with Friends and Family: Not on file  . Frequency of Social Gatherings with Friends and Family: Not on file  . Attends Religious Services: Not on file  . Active Member of Clubs or Organizations: Not on file  . Attends Archivist Meetings: Not on  file  . Marital Status: Not on file  Intimate Partner Violence:   . Fear of Current or Ex-Partner: Not on file  . Emotionally Abused: Not on file  . Physically Abused: Not on file  . Sexually Abused: Not on file    Physical Exam      Future Appointments  Date Time Provider [place]  09/01/2019  1:40 PM Larey Dresser, MD [facility] None  09/04/2019  9:30 AM Gardiner Barefoot, DPM [facility]    BP 116/72   Pulse 74   Temp 98 F (36.7 C)   Resp 16   Wt 209 lb (94.8 kg)   SpO2 97%   BMI 32.73 kg/m  CBG EMS-208  Weight yesterday-207 Last visit weight-212  Pt reports doing well. Weight doing good. No increased  sob, no dizziness, no c/p. No missed doses of his meds.  They were able to see his PCP and get his metformin rx sent in for the '100mg'$  BID.  His cardiomems is working now and they have not had any issues with transmitting it through now since they moved the 02 tank from bedroom to hallway. No reported missed doses of his meds. meds verified and pill box refilled. No edema noted, he had on his leg wraps.  He spoke to his PCP last week about his hydroxyzine and how it makes him so sleepy all the time, they had him taking it TID and he would nod off to sleep all day long. So the PCP is going to change his dosing to '25mg'$  but for now he wants to try it only at night time and see how it works and leave out the day doses.   [name], [facility] Paramedic  08/26/19

## 2019-08-27 NOTE — Telephone Encounter (Signed)
Patient is non compliant because: Patient has 5 or 6 night terrors a day and at night and does not sleep well.  His sleep pattern is he Falls asleep sitting up during the day. He goes to bed at 11p or 12 am and he is back up at 3:30 am or 4:30 am. Per his wife mask is not leaking.  He has a Large size Resmed Full Face Mask Mirage Quattro mask . Yes, patient sleeps on his back.

## 2019-08-31 ENCOUNTER — Telehealth (HOSPITAL_COMMUNITY): Payer: Self-pay

## 2019-08-31 NOTE — Telephone Encounter (Signed)
COVID-19 pre-appointment screening questions: PTS WIFE BONNIE ANSWERED QUESTIONS   Do you have a history of COVID-19 or a positive test result in the past 7-10 days? NO  To the best of your knowledge, have you been in close contact with anyone with a confirmed diagnosis of COVID 19? NO  Have you had any one or more of the following: Fever, chills, cough, shortness of breath (out of the normal for you) or any flu-like symptoms? NO  Are you experiencing any of the following symptoms that is new or out of usual for you: NO   Ear, nose or throat discomfort  Sore throat  Headache  Muscle Pain  Diarrhea  Loss of taste or smell   Reviewed all the following with patient: REVIEWED  Use of hand sanitizer when entering the building  Everyone is required to wear a mask in the building, if you do not have a mask we are happy to provide you with one when you arrive  NO Visitor guidelines   If patient answers YES to any of questions they must change to a virtual visit and place note in comments about symptoms

## 2019-09-01 ENCOUNTER — Other Ambulatory Visit (HOSPITAL_COMMUNITY): Payer: Self-pay

## 2019-09-01 ENCOUNTER — Telehealth (HOSPITAL_COMMUNITY): Payer: Self-pay | Admitting: Licensed Clinical Social Worker

## 2019-09-01 ENCOUNTER — Ambulatory Visit (HOSPITAL_COMMUNITY)
Admission: RE | Admit: 2019-09-01 | Discharge: 2019-09-01 | Disposition: A | Discharge status: Home / Self Care | Payer: Medicare Other | Source: Ambulatory Visit | Attending: Cardiology | Admitting: Cardiology

## 2019-09-01 ENCOUNTER — Other Ambulatory Visit: Payer: Self-pay

## 2019-09-01 ENCOUNTER — Encounter (HOSPITAL_COMMUNITY): Payer: Self-pay | Admitting: Cardiology

## 2019-09-01 VITALS — BP 108/70 | HR 85 | Wt 211.6 lb

## 2019-09-01 DIAGNOSIS — I712 Thoracic aortic aneurysm, without rupture: Secondary | ICD-10-CM | POA: Diagnosis not present

## 2019-09-01 DIAGNOSIS — E11621 Type 2 diabetes mellitus with foot ulcer: Secondary | ICD-10-CM | POA: Insufficient documentation

## 2019-09-01 DIAGNOSIS — N183 Chronic kidney disease, stage 3 unspecified: Secondary | ICD-10-CM | POA: Insufficient documentation

## 2019-09-01 DIAGNOSIS — Z8673 Personal history of transient ischemic attack (TIA), and cerebral infarction without residual deficits: Secondary | ICD-10-CM | POA: Insufficient documentation

## 2019-09-01 DIAGNOSIS — Z79899 Other long term (current) drug therapy: Secondary | ICD-10-CM | POA: Diagnosis not present

## 2019-09-01 DIAGNOSIS — I447 Left bundle-branch block, unspecified: Secondary | ICD-10-CM | POA: Diagnosis not present

## 2019-09-01 DIAGNOSIS — I5022 Chronic systolic (congestive) heart failure: Secondary | ICD-10-CM

## 2019-09-01 DIAGNOSIS — Z7984 Long term (current) use of oral hypoglycemic drugs: Secondary | ICD-10-CM | POA: Diagnosis not present

## 2019-09-01 DIAGNOSIS — Z8249 Family history of ischemic heart disease and other diseases of the circulatory system: Secondary | ICD-10-CM | POA: Insufficient documentation

## 2019-09-01 DIAGNOSIS — G4733 Obstructive sleep apnea (adult) (pediatric): Secondary | ICD-10-CM | POA: Insufficient documentation

## 2019-09-01 DIAGNOSIS — Z7951 Long term (current) use of inhaled steroids: Secondary | ICD-10-CM | POA: Insufficient documentation

## 2019-09-01 DIAGNOSIS — Z7982 Long term (current) use of aspirin: Secondary | ICD-10-CM | POA: Diagnosis not present

## 2019-09-01 DIAGNOSIS — I5032 Chronic diastolic (congestive) heart failure: Secondary | ICD-10-CM | POA: Diagnosis present

## 2019-09-01 DIAGNOSIS — E1122 Type 2 diabetes mellitus with diabetic chronic kidney disease: Secondary | ICD-10-CM | POA: Diagnosis not present

## 2019-09-01 DIAGNOSIS — I130 Hypertensive heart and chronic kidney disease with heart failure and stage 1 through stage 4 chronic kidney disease, or unspecified chronic kidney disease: Secondary | ICD-10-CM | POA: Diagnosis not present

## 2019-09-01 DIAGNOSIS — Z8674 Personal history of sudden cardiac arrest: Secondary | ICD-10-CM | POA: Insufficient documentation

## 2019-09-01 DIAGNOSIS — E785 Hyperlipidemia, unspecified: Secondary | ICD-10-CM | POA: Insufficient documentation

## 2019-09-01 DIAGNOSIS — I2510 Atherosclerotic heart disease of native coronary artery without angina pectoris: Secondary | ICD-10-CM | POA: Insufficient documentation

## 2019-09-01 LAB — BASIC METABOLIC PANEL
Anion gap: 13 (ref 5–15)
BUN: 39 mg/dL — ABNORMAL HIGH (ref 8–23)
CO2: 31 mmol/L (ref 22–32)
Calcium: 8.4 mg/dL — ABNORMAL LOW (ref 8.9–10.3)
Chloride: 96 mmol/L — ABNORMAL LOW (ref 98–111)
Creatinine, Ser: 1.75 mg/dL — ABNORMAL HIGH (ref 0.61–1.24)
GFR calc Af Amer: 45 mL/min — ABNORMAL LOW (ref >60)
GFR calc non Af Amer: 39 mL/min — ABNORMAL LOW (ref >60)
Glucose, Bld: 177 mg/dL — ABNORMAL HIGH (ref 70–99)
Potassium: 3.9 mmol/L (ref 3.5–5.1)
Sodium: 140 mmol/L (ref 135–145)

## 2019-09-01 MED ORDER — CARVEDILOL 12.5 MG PO TABS: 12.5000 mg | ORAL_TABLET | Freq: Two times a day (BID) | ORAL | 5 refills | Status: DC

## 2019-09-01 NOTE — Progress Notes (Signed)
Paramedicine Encounter   Patient ID: [name] , male,   DOB: 1951/04/03,68 y.o.,  MRN: [id number]   Met patient in clinic today with provider.  B/p-108/70 p-85 sp02-95 Weight @ clinic-211  Pt states he is doing ok, wife reports they were called about the lack of use of BI-PAP machine, he gets up several times a night, has night terrors and then doesn't get back to sleep so then he nods off asleep a lot during the day. So he isnt always getting the 4 hrs of sleep.  He has noticed now at night time a few times the past week when he is having BM he gets chest pains but its only happening at night time during that BM.   Carvedilol-increased to 12.10m BID Labs done today.  EKG done today.  Will see him tomor for pill box refill.   KMarylouise Stacks [place]3/08/2019

## 2019-09-01 NOTE — Telephone Encounter (Signed)
CSW called pt to discuss Extra Help program to assist with insurance premium costs and medication costs and assess if pt is eligible.  Pt wife answered and spoke with CSW regarding this program.  Pt wife believes that he is already enrolled as she recalls he has a Medicaid program just for his medications and doesn't pay more than $3/medication at this time.   No further needs at this time.  CSW will continue to follow through [employer] and assist as needed,  Burna Sis, LCSW Clinical Social Worker [facility] Desk#: [phone] Cell#: [phone]

## 2019-09-01 NOTE — Patient Instructions (Addendum)
INCREASE Coreg to 12.5mg  (1 tab) twice a day   Labs today We will only contact you if something comes back abnormal or we need to make some changes. Otherwise no news is good news!   You can call the sleep medicine office [phone] and ask for [place].    Your physician recommends that you schedule a follow-up appointment in: 3 months with Dr Shirlee Latch.   Please call office at [phone] option [phone] if you have any questions or concerns.    At the [facility], you and your health needs are our priority. As part of our continuing mission to provide you with exceptional heart care, we have created designated Provider Care Teams. These Care Teams include your primary Cardiologist (physician) and Advanced Practice Providers (APPs- Physician Assistants and Nurse Practitioners) who all work together to provide you with the care you need, when you need it.   You may see any of the following providers on your designated Care Team at your next follow up: Marland Kitchen Dr Arvilla Meres . Dr Marca Ancona . Tonye Becket, NP . Robbie Lis, PA . Karle Plumber, PharmD   Please be sure to bring in all your medications bottles to every appointment.

## 2019-09-02 ENCOUNTER — Other Ambulatory Visit (HOSPITAL_COMMUNITY): Payer: Self-pay

## 2019-09-02 ENCOUNTER — Telehealth: Payer: Self-pay | Admitting: Physician Assistant

## 2019-09-02 NOTE — Telephone Encounter (Signed)
Called by wife okay to increase 3L oxygen on BiPAP. His oxygen was "dropping to 70% few weeks ago". Unable to provider current information. Stays his husband "scrams at day time". This is chronic.  No dyspnea or chest pain.   Will have Dr. Mayford Knife review BiPAP settings and advised to follow up with PCP for "scrams".

## 2019-09-02 NOTE — Progress Notes (Signed)
Came out today for med rec.  meds verified and pill box refilled.  Increased dose of carvedilol started today.   Kerry Hough, EMT-Paramedic  09/02/19

## 2019-09-02 NOTE — Progress Notes (Signed)
[facility] Clinic Note  HF Cardiology: Dr. Aundra Dubin  Reason for Visit: follow-up for chronic Diastolic CHF  69 y.o. with history of CHF, [name], [name] for followup of CHF.  He has a history of mild LV systolic dysfunction (EF 18-29% range) with probably significant RV failure.  Patient was admitted in 4/20 with hypoxic respiratory failure due to PNA and decompensated HF.  Hospitalization was complicated by PEA arrest and AKI.   In 6/20, he was again admitted with hypoxemic respiratory failure with hypertensive crisis, CHF, and PNA.    Due to frequent hospital admission for CHF, he recently underwent Cardiomems implantation, done in 11/20.  RHC at that time showed optimized filling pressures after adjustment of torsemide. He was also recently set up for outpatient sleep study evaluation for fatigue, snoring and excessive daytime sleepiness. He was found to have severe OSA and is being set up for CPAP titration. He was also referred to HF paramedicine program.   Breathing is stable, no dyspnea walking on flat ground.  No peripheral edema.  Weight down 1 lb.  Occasional palpitations.  He has noted some chest tightness, generally at night and often when sitting on the commode.  No NTG use, pain is relatively brief. No exertional chest pain.  No orthopnea/PND. He is now using Bipap at night.   PADP (Cardiomems) 16 mmHg (goal < 18)  Labs (4/20): LDL 91 Labs (9/20): K 4, creatinine 1.95 => 2.1 Labs (10/20): K 4.3, creatinine 1.8 Labs (11/20): K 4.9, creatinine 1.73 Labs (2/21): K 4.1, creatinine 1.9  ECG (personally reviewed): NSR, LVH, inferior Qs  PMH: 1. HTN 2. [name] stage 3 3. Type 2 diabetes 4. Hyperlipidemia 5. COPD: Suspected 6. PNA: Recurrent, suspected aspiration.  7. PEA arrest 4/20: Respiratory arrest.  8. LBBB 9. TIA 10. Ascending aortic aneurysm: CT chest in 6/20 with 5.1 cm ascending aortic aneurysm. 11. Chronic primarily diastolic CHF:  - LHC  (9/37): Nonobstructive CAD.  - Echo (12/19): EF 45-50%.  - Echo (4/20): EF 45-50%, diffuse hypokinesis, normal RV.  - Cardiomems placement (11/20). - RHC (11/20): mean RA 2, PA 33/8, mean PCWP 11, CI 3.32 12. Ventricular ectopy: 8/20 monitor showed PVCs and NSVT.  13. Peripheral arterial dopplers (10/20): No significant stenosis.   14. OSA: Bipap  Social History   Socioeconomic History  . Marital status: Married    Spouse name: Not on file  . Number of children: Not on file  . Years of education: Not on file  . Highest education level: Not on file  Occupational History  . Not on file  Tobacco Use  . Smoking status: Never Smoker  . Smokeless tobacco: Never Used  Substance and Sexual Activity  . Alcohol use: No  . Drug use: No  . Sexual activity: Not Currently    Partners: Female    Birth control/protection: None  Other Topics Concern  . Not on file  Social History Narrative   Patient lives in private residence with supportive spouse   Social Determinants of Health   Financial Resource Strain:   . Difficulty of Paying Living Expenses: Not on file  Food Insecurity:   . Worried About Charity fundraiser in the Last Year: Not on file  . Ran Out of Food in the Last Year: Not on file  Transportation Needs:   . Lack of Transportation (Medical): Not on file  . Lack of Transportation (Non-Medical): Not on file  Physical Activity:   . Days  of Exercise per Week: Not on file  . Minutes of Exercise per Session: Not on file  Stress:   . Feeling of Stress : Not on file  Social Connections:   . Frequency of Communication with Friends and Family: Not on file  . Frequency of Social Gatherings with Friends and Family: Not on file  . Attends Religious Services: Not on file  . Active Member of Clubs or Organizations: Not on file  . Attends Archivist Meetings: Not on file  . Marital Status: Not on file  Intimate Partner Violence:   . Fear of Current or Ex-Partner: Not on  file  . Emotionally Abused: Not on file  . Physically Abused: Not on file  . Sexually Abused: Not on file   Family History  Problem Relation Age of Onset  . Emphysema Mother   . Aneurysm Mother   . Emphysema Father   . Coronary artery disease Brother   . Coronary artery disease Brother   . Coronary artery disease Sister    ROS: All systems reviewed and negative except as per HPI.   Current Outpatient Medications  Medication Sig Dispense Refill  . Accu-Chek FastClix Lancets MISC USE UTD    . ACCU-CHEK GUIDE test strip U TO TEST BLOOD SUGAR QD    . aspirin EC 81 MG tablet Take 81 mg by mouth daily.    Marland Kitchen atorvastatin (LIPITOR) 40 MG tablet Take 1 tablet (40 mg total) by mouth every evening. 30 tablet 11  . Blood Glucose Monitoring Suppl (ACCU-CHEK GUIDE) w/Device KIT U TO TEST BLOOD SUGAR QD    . carvedilol (COREG) 12.5 MG tablet Take 1 tablet (12.5 mg total) by mouth 2 (two) times daily. 60 tablet 5  . Dextromethorphan HBr (ROBITUSSIN LINGERING COUGHGELS) 15 MG CAPS Take 15 mg by mouth daily as needed (cough).    . famotidine (PEPCID) 20 MG tablet Take 20 mg by mouth at bedtime.    . finasteride (PROSCAR) 5 MG tablet Take 5 mg by mouth daily.    Marland Kitchen gabapentin (NEURONTIN) 300 MG capsule Take 300 mg by mouth 4 (four) times daily.     . hydrOXYzine (VISTARIL) 50 MG capsule Take 50 mg by mouth at bedtime.     . metFORMIN (GLUCOPHAGE) 1000 MG tablet Take 1,000 mg by mouth 2 (two) times a day.     . montelukast (SINGULAIR) 10 MG tablet Take 10 mg by mouth daily.     . Multiple Vitamin (MULTIVITAMIN) tablet Take 1 tablet by mouth daily.    . pantoprazole (PROTONIX) 40 MG tablet Take 1 tablet (40 mg total) by mouth daily. 30 tablet 1  . potassium chloride SA (KLOR-CON) 20 MEQ tablet Take 20 mEq by mouth daily.     . SYMBICORT 80-4.5 MCG/ACT inhaler Inhale 2 puffs into the lungs daily as needed.     . tamsulosin (FLOMAX) 0.4 MG CAPS capsule Take 0.4 mg by mouth daily.    Marland Kitchen torsemide (DEMADEX)  20 MG tablet Take 2 tablets by mouth every morning and 1 tablet by mouth every evening    . VENTOLIN HFA 108 (90 Base) MCG/ACT inhaler Inhale 2 puffs into the lungs every 6 (six) hours as needed for wheezing or shortness of breath.      No current facility-administered medications for this encounter.   BP 108/70   Pulse 85   Wt 96 kg (211 lb 9.6 oz)   SpO2 95%   BMI 33.14 kg/m  General: NAD Neck:  No JVD, no thyromegaly or thyroid nodule.  Lungs: Clear to auscultation bilaterally with normal respiratory effort. CV: Nondisplaced PMI.  Heart regular S1/S2, no S3/S4, 1/6 HSM LLSB. Trace ankle edema.  No carotid bruit.  Normal pedal pulses.  Abdomen: Soft, nontender, no hepatosplenomegaly, no distention.  Skin: Intact without lesions or rashes.  Neurologic: Alert and oriented x 3.  Psych: Normal affect. Extremities: No clubbing or cyanosis.  HEENT: Normal.   Assessment/Plan: 1. Chronic primarily diastolic CHF: Suspect that there is also significant RV failure.  Last echo in 4/20 with EF 45-50%.  Cath in 9/12 showed nonobstructive coronary disease.  S/p Cardiomems implant 11/20. Weight is down 1 lb and he looks euvolemic. PADP stable at 16 by Cardiomems.  - Continue torsemide 40 qam/20 qpm, BMET today.  - Increase Coreg to 12.5 mg bid.  2. [name]: Stage 3.  BMET today.  3. OSA: Now on Bipap.  4. Leg/foot ulcers: Peripheral arterial dopplers did not show significant PAD, suspect venous insufficiency.  5. Recurrent PNA: ?Aspiration.  6. LBBB: Chronic.  7. Ascending aortic aneurysm: CT chest 6/20 with 5.1 cm ascending aortic aneurysm.  He currently would be a poor surgical candidate.   Followup 3 months.   Loralie Champagne 09/02/2019

## 2019-09-03 NOTE — Telephone Encounter (Signed)
Harold Strebeck called the after hours line to get an oxygen increase to 3 liters up from 2L and the on call doctor authorized the change for the night and advised [name] to call in the morning to get his doctor to authorize the increase. The patient states he feels better on 3L.

## 2019-09-04 ENCOUNTER — Ambulatory Visit (INDEPENDENT_AMBULATORY_CARE_PROVIDER_SITE_OTHER): Payer: Medicare Other | Admitting: Podiatry

## 2019-09-04 ENCOUNTER — Other Ambulatory Visit: Payer: Self-pay

## 2019-09-04 ENCOUNTER — Encounter: Payer: Self-pay | Admitting: Podiatry

## 2019-09-04 VITALS — Temp 96.7°F

## 2019-09-04 DIAGNOSIS — M79674 Pain in right toe(s): Secondary | ICD-10-CM | POA: Diagnosis not present

## 2019-09-04 DIAGNOSIS — M79675 Pain in left toe(s): Secondary | ICD-10-CM | POA: Diagnosis not present

## 2019-09-04 DIAGNOSIS — B351 Tinea unguium: Secondary | ICD-10-CM | POA: Diagnosis not present

## 2019-09-04 DIAGNOSIS — E119 Type 2 diabetes mellitus without complications: Secondary | ICD-10-CM | POA: Diagnosis not present

## 2019-09-04 NOTE — Telephone Encounter (Signed)
His AHI is too high and shows a mask leak - please set up appt with DME to refit mask.  Also get an overnight pulse ox on BiPAP and 3L O2.  Please find out if he has seen anyone in regards to his night terrors

## 2019-09-04 NOTE — Progress Notes (Signed)
Complaint:  Visit Type: Patient returns to my office for continued preventative foot care services. Complaint: Patient states" my nails have grown long and thick and become painful to walk and wear shoes" Patient has been diagnosed with DM with neuropathy.  . The patient presents for preventative foot care services.   Podiatric Exam: Vascular: dorsalis pedis and posterior tibial pulses are absent  bilateral. Capillary return is immediate. Temperature gradient is WNL. Skin turgor WNL  Sensorium: Diminished/Absent  Semmes Weinstein monofilament test. Diminished  tactile sensation bilaterally. Nail Exam: Pt has thick disfigured discolored nails with subungual debris noted bilateral entire nail hallux through fifth toenails Ulcer Exam: There is no evidence of ulcer or pre-ulcerative changes or infection. Orthopedic Exam: Muscle tone and strength are WNL. No limitations in general ROM. No crepitus or effusions noted. Foot type and digits show no abnormalities.Hallux limitus 1st MPJ  B/L.  Midfoot DJD  B/L. Limitation  ROM rearfoot  B/L. Skin: No Porokeratosis. No infection or ulcers  Diagnosis:  Onychomycosis, , Pain in right toe, pain in left toes  Treatment & Plan Procedures and Treatment: Consent by patient was obtained for treatment procedures.   Debridement of mycotic and hypertrophic toenails, 1 through 5 bilateral and clearing of subungual debris. No ulceration, no infection noted. Patient was reexamined for his diabetic feet and no pulses were noted.  He also was found to have diminished LOPS or absent LOPS.  Patient has not seen doctor but nurse practitioner.  Return Visit-Office Procedure: Patient instructed to return to the office for a follow up visit 3 months for continued  Preventative foot care services.Helane Gunther DPM

## 2019-09-04 NOTE — Telephone Encounter (Signed)
Also please have patient stop sleeping on his back - they can purchase sleep pillows on line to help his stay off his back.  What type of mask Is he using

## 2019-09-07 NOTE — Telephone Encounter (Signed)
Even though he is feeling better on 3L he may still have desats and needs the overnight pulse ox -Insurance should pay for it

## 2019-09-07 NOTE — Telephone Encounter (Signed)
He needs to stay off his back as he is still having too many apneas.  Please get an overnight pulse ox on 3L and PAP

## 2019-09-07 NOTE — Telephone Encounter (Signed)
Mrs Talavera says her husband states he is feeling good on the 3 L of 02 and she does not want to change anything right now concerning him.  He has a resmed airfit f20 mask large mask.

## 2019-09-07 NOTE — Telephone Encounter (Signed)
Mrs Neely says her husband states he is feeling good on the 3 L of 02 and she does not want to change anything right now concerning him.  Mrs Mounsey says the insurance will not pay for another Over Night Pulse oximetry. Wife states his mask does not leak because she sleeps in the bed with him and she would hear it . Mrs Ledet says her husband has not seen anyone concerning his night terrors because they are now getting to be less now that he is on the 3 L of oxygen.

## 2019-09-09 ENCOUNTER — Other Ambulatory Visit (HOSPITAL_COMMUNITY): Payer: Self-pay

## 2019-09-09 NOTE — Progress Notes (Signed)
Paramedicine Encounter    Patient ID: [name], male    DOB: 1950/07/08, 69 y.o.   MRN: [id number]   Patient Care Team: Clinic, Medical, MD (Inactive) as PCP - General Sherren Mocha, MD as PCP - Cardiology (Cardiology) Larey Dresser, MD as PCP - Advanced Heart Failure (Cardiology)  Patient Active Problem List   Diagnosis Date Noted  . Hallux limitus of left foot 06/05/2019  . Hallux limitus of right foot 06/05/2019  . Seasonal allergies 02/12/2019  . Health care maintenance 02/12/2019  . GERD (gastroesophageal reflux disease) 02/12/2019  . Nocturnal hypoxemia 02/12/2019  . OSA (obstructive sleep apnea) 02/12/2019  . Pain due to onychomycosis of toenails of both feet 01/30/2019  . Lobar pneumonia ([place]) 12/29/2018  . Diabetes mellitus type 2, controlled, without complications ([place]) 02/12/4817  . Thoracic aortic aneurysm ([place]) 12/29/2018  . Acute on chronic combined systolic and diastolic CHF (congestive heart failure) ([place]) 12/25/2018  . Syncope 12/25/2018  . Chronic systolic heart failure ([place]) 11/19/2018  . Chronic venous insufficiency 06/24/2018  . Respiratory failure ([place]) 06/01/2018  . Leg swelling 05/06/2018  . Acute respiratory failure with hypoxemia ([place]) 10/14/2017  . Influenza-like illness 10/14/2017  . Delirium 10/14/2017  . Acute urinary retention 10/14/2017  . (HFpEF) heart failure with preserved ejection fraction ([place]), pulmonary edema and LBBB of unknown chronicity  10/29/2016  . Elevated troponin I level, presume demand ischemia  10/29/2016  . AKI (acute kidney injury) ([place]) 10/29/2016  . BPH (benign prostatic hyperplasia) 10/29/2016  . Sepsis ([place]) 10/29/2016  . Bacteremia due to Escherichia coli 10/29/2016  . Fatty liver 10/29/2016  . Acute pulmonary edema (HCC)   . Acute hypoxic and hypercarbic respiratory failure (HCC) in setting of bilateral pulmonary infiltrates. Presume CAP + pulmonary edema +/-ALI 10/27/2016  . Left-sided weakness 05/04/2015  .  TIA (transient ischemic attack) : Rule out. 05/04/2015  . Hyperlipidemia 05/04/2015  . Chest pain 10/10/2013  . SOB (shortness of breath) 10/10/2013  . Hyperglycemia without ketosis 10/17/2011  . HTN (hypertension) 10/17/2011  . Foot drop, left 10/17/2011  . Impacted cerumen of right ear 10/17/2011  . Dyspnea 10/17/2011  . History of TIA (transient ischemic attack) 10/17/2011  . Neuropathy ([place]) 10/17/2011  . Aortic insufficiency 03/16/2011  . Chest pain 03/16/2011    Current Outpatient Medications:  .  Accu-Chek FastClix Lancets MISC, USE UTD, Disp: , Rfl:  .  ACCU-CHEK GUIDE test strip, U TO TEST BLOOD SUGAR QD, Disp: , Rfl:  .  aspirin EC 81 MG tablet, Take 81 mg by mouth daily., Disp: , Rfl:  .  atorvastatin (LIPITOR) 40 MG tablet, Take 1 tablet (40 mg total) by mouth every evening., Disp: 30 tablet, Rfl: 11 .  Blood Glucose Monitoring Suppl (ACCU-CHEK GUIDE) w/Device KIT, U TO TEST BLOOD SUGAR QD, Disp: , Rfl:  .  carvedilol (COREG) 12.5 MG tablet, Take 1 tablet (12.5 mg total) by mouth 2 (two) times daily., Disp: 60 tablet, Rfl: 5 .  famotidine (PEPCID) 20 MG tablet, Take 20 mg by mouth at bedtime., Disp: , Rfl:  .  finasteride (PROSCAR) 5 MG tablet, Take 5 mg by mouth daily., Disp: , Rfl:  .  gabapentin (NEURONTIN) 300 MG capsule, Take 300 mg by mouth 4 (four) times daily. , Disp: , Rfl:  .  hydrOXYzine (VISTARIL) 50 MG capsule, Take 50 mg by mouth at bedtime. , Disp: , Rfl:  .  metFORMIN (GLUCOPHAGE) 1000 MG tablet, Take 1,000 mg by mouth 2 (two) times a day. ,  Disp: , Rfl:  .  montelukast (SINGULAIR) 10 MG tablet, Take 10 mg by mouth daily. , Disp: , Rfl:  .  Multiple Vitamin (MULTIVITAMIN) tablet, Take 1 tablet by mouth daily., Disp: , Rfl:  .  pantoprazole (PROTONIX) 40 MG tablet, Take 1 tablet (40 mg total) by mouth daily., Disp: 30 tablet, Rfl: 1 .  potassium chloride SA (KLOR-CON) 20 MEQ tablet, Take 20 mEq by mouth daily. , Disp: , Rfl:  .  SYMBICORT 80-4.5 MCG/ACT  inhaler, Inhale 2 puffs into the lungs daily as needed. , Disp: , Rfl:  .  tamsulosin (FLOMAX) 0.4 MG CAPS capsule, Take 0.4 mg by mouth daily., Disp: , Rfl:  .  torsemide (DEMADEX) 20 MG tablet, Take 2 tablets by mouth every morning and 1 tablet by mouth every evening, Disp: , Rfl:  .  VENTOLIN HFA 108 (90 Base) MCG/ACT inhaler, Inhale 2 puffs into the lungs every 6 (six) hours as needed for wheezing or shortness of breath. , Disp: , Rfl:  .  Dextromethorphan HBr (ROBITUSSIN LINGERING COUGHGELS) 15 MG CAPS, Take 15 mg by mouth daily as needed (cough)., Disp: , Rfl:  Allergies  Allergen Reactions  . Cashew Nut Oil Palpitations  . Percocet [Oxycodone-Acetaminophen] Other (See Comments)    Hallucintaions      Social History   Socioeconomic History  . Marital status: Married    Spouse name: Not on file  . Number of children: Not on file  . Years of education: Not on file  . Highest education level: Not on file  Occupational History  . Not on file  Tobacco Use  . Smoking status: Never Smoker  . Smokeless tobacco: Never Used  Substance and Sexual Activity  . Alcohol use: No  . Drug use: No  . Sexual activity: Not Currently    Partners: Female    Birth control/protection: None  Other Topics Concern  . Not on file  Social History Narrative   Patient lives in private residence with supportive spouse   Social Determinants of Health   Financial Resource Strain:   . Difficulty of Paying Living Expenses: Not on file  Food Insecurity:   . Worried About Charity fundraiser in the Last Year: Not on file  . Ran Out of Food in the Last Year: Not on file  Transportation Needs:   . Lack of Transportation (Medical): Not on file  . Lack of Transportation (Non-Medical): Not on file  Physical Activity:   . Days of Exercise per Week: Not on file  . Minutes of Exercise per Session: Not on file  Stress:   . Feeling of Stress : Not on file  Social Connections:   . Frequency of  Communication with Friends and Family: Not on file  . Frequency of Social Gatherings with Friends and Family: Not on file  . Attends Religious Services: Not on file  . Active Member of Clubs or Organizations: Not on file  . Attends Archivist Meetings: Not on file  . Marital Status: Not on file  Intimate Partner Violence:   . Fear of Current or Ex-Partner: Not on file  . Emotionally Abused: Not on file  . Physically Abused: Not on file  . Sexually Abused: Not on file    Physical Exam      Future Appointments  Date Time Provider [place]  12/03/2019 11:40 AM Larey Dresser, MD [facility] None  12/09/2019 10:15 AM Gardiner Barefoot, DPM [facility]    BP  124/70   Pulse 65   Resp 16   Wt 211 lb (95.7 kg)   SpO2 93%   BMI 33.05 kg/m   Weight yesterday-209 Last visit weight-211 @ clinic  Pt reports he is feeling good, he thought the overnight pulse ox test was the in office test but last time that test was done in the home-so I told him that he and his wife would have to follow up  with that to see how to get that equipment.  Pt denies sob, no dizziness, no c/p.  His weight is up 2 lbs from yesterday, it seems to fluctuate  between 209-213. His grandson had bday over the wknd so he did indulge a bit. I asked him to cut back on snacks and fluid intake to lower his weight.  meds verified and pill box refilled.  --called in gabapentin for refill   [name], [place] Paramedic  09/09/19

## 2019-09-10 NOTE — Telephone Encounter (Signed)
Reached out to patient and spoke to Mrs Winnett again and gave her your recommendations. Mrs Espaillat says her husband does not sleep on his back but sleeps elevated up in a recliner because of reflux. Mrs Swindler also says she is declining tthe over night pulse ox for now because her husband is doing so much better. He stays awake watching movies now, his screams have diminished and he is getting a couple more hours sleep in the morning. She says the doctor also changed some of his medicines and that could be a contributing factor also. Wife says maybe later in the year she will try another over night pulse ox but not now. She says he is better and she wants to enjoy him like this for awhile.  

## 2019-09-10 NOTE — Telephone Encounter (Signed)
Please let patient know that this is just a small monitor on finger for one night and I need this measured

## 2019-09-10 NOTE — Telephone Encounter (Signed)
Reached out to patient and spoke to Mrs Gervasi again and gave her your recommendations. Mrs Mccree says her husband does not sleep on his back but sleeps elevated up in a recliner because of reflux. Mrs Biegler also says she is declining tthe over night pulse ox for now because her husband is doing so much better. He stays awake watching movies now, his screams have diminished and he is getting a couple more hours sleep in the morning. She says the doctor also changed some of his medicines and that could be a contributing factor also. Wife says maybe later in the year she will try another over night pulse ox but not now. She says he is better and she wants to enjoy him like this for awhile.

## 2019-09-16 ENCOUNTER — Other Ambulatory Visit (HOSPITAL_COMMUNITY): Payer: Self-pay

## 2019-09-16 NOTE — Progress Notes (Signed)
Paramedicine Encounter    Patient ID: [name], male    DOB: [birth year], 69 y.o.   MRN: [id number]   Patient Care Team: Clinic, Medical, MD (Inactive) as PCP - General Sherren Mocha, MD as PCP - Cardiology (Cardiology) Larey Dresser, MD as PCP - Advanced Heart Failure (Cardiology)  Patient Active Problem List   Diagnosis Date Noted  . Hallux limitus of left foot 06/05/2019  . Hallux limitus of right foot 06/05/2019  . Seasonal allergies 02/12/2019  . Health care maintenance 02/12/2019  . GERD (gastroesophageal reflux disease) 02/12/2019  . Nocturnal hypoxemia 02/12/2019  . OSA (obstructive sleep apnea) 02/12/2019  . Pain due to onychomycosis of toenails of both feet 01/30/2019  . Lobar pneumonia ([place]) 12/29/2018  . Diabetes mellitus type 2, controlled, without complications ([place]) 32/99/2426  . Thoracic aortic aneurysm ([place]) 12/29/2018  . Acute on chronic combined systolic and diastolic CHF (congestive heart failure) ([place]) 12/25/2018  . Syncope 12/25/2018  . Chronic systolic heart failure ([place]) 11/19/2018  . Chronic venous insufficiency 06/24/2018  . Respiratory failure ([place]) 06/01/2018  . Leg swelling 05/06/2018  . Acute respiratory failure with hypoxemia ([place]) 10/14/2017  . Influenza-like illness 10/14/2017  . Delirium 10/14/2017  . Acute urinary retention 10/14/2017  . (HFpEF) heart failure with preserved ejection fraction ([place]), pulmonary edema and LBBB of unknown chronicity  10/29/2016  . Elevated troponin I level, presume demand ischemia  10/29/2016  . AKI (acute kidney injury) ([place]) 10/29/2016  . BPH (benign prostatic hyperplasia) 10/29/2016  . Sepsis ([place]) 10/29/2016  . Bacteremia due to Escherichia coli 10/29/2016  . Fatty liver 10/29/2016  . Acute pulmonary edema (HCC)   . Acute hypoxic and hypercarbic respiratory failure (HCC) in setting of bilateral pulmonary infiltrates. Presume CAP + pulmonary edema +/-ALI 10/27/2016  . Left-sided weakness 05/04/2015  .  TIA (transient ischemic attack) : Rule out. 05/04/2015  . Hyperlipidemia 05/04/2015  . Chest pain 10/10/2013  . SOB (shortness of breath) 10/10/2013  . Hyperglycemia without ketosis 10/17/2011  . HTN (hypertension) 10/17/2011  . Foot drop, left 10/17/2011  . Impacted cerumen of right ear 10/17/2011  . Dyspnea 10/17/2011  . History of TIA (transient ischemic attack) 10/17/2011  . Neuropathy ([place]) 10/17/2011  . Aortic insufficiency 03/16/2011  . Chest pain 03/16/2011    Current Outpatient Medications:  .  Accu-Chek FastClix Lancets MISC, USE UTD, Disp: , Rfl:  .  ACCU-CHEK GUIDE test strip, U TO TEST BLOOD SUGAR QD, Disp: , Rfl:  .  aspirin EC 81 MG tablet, Take 81 mg by mouth daily., Disp: , Rfl:  .  atorvastatin (LIPITOR) 40 MG tablet, Take 1 tablet (40 mg total) by mouth every evening., Disp: 30 tablet, Rfl: 11 .  Blood Glucose Monitoring Suppl (ACCU-CHEK GUIDE) w/Device KIT, U TO TEST BLOOD SUGAR QD, Disp: , Rfl:  .  carvedilol (COREG) 12.5 MG tablet, Take 1 tablet (12.5 mg total) by mouth 2 (two) times daily., Disp: 60 tablet, Rfl: 5 .  Dextromethorphan HBr (ROBITUSSIN LINGERING COUGHGELS) 15 MG CAPS, Take 15 mg by mouth daily as needed (cough)., Disp: , Rfl:  .  famotidine (PEPCID) 20 MG tablet, Take 20 mg by mouth at bedtime., Disp: , Rfl:  .  finasteride (PROSCAR) 5 MG tablet, Take 5 mg by mouth daily., Disp: , Rfl:  .  gabapentin (NEURONTIN) 300 MG capsule, Take 300 mg by mouth 4 (four) times daily. , Disp: , Rfl:  .  hydrOXYzine (VISTARIL) 50 MG capsule, Take 50 mg by mouth at  bedtime. , Disp: , Rfl:  .  metFORMIN (GLUCOPHAGE) 1000 MG tablet, Take 1,000 mg by mouth 2 (two) times a day. , Disp: , Rfl:  .  montelukast (SINGULAIR) 10 MG tablet, Take 10 mg by mouth daily. , Disp: , Rfl:  .  Multiple Vitamin (MULTIVITAMIN) tablet, Take 1 tablet by mouth daily., Disp: , Rfl:  .  pantoprazole (PROTONIX) 40 MG tablet, Take 1 tablet (40 mg total) by mouth daily., Disp: 30 tablet, Rfl:  1 .  potassium chloride SA (KLOR-CON) 20 MEQ tablet, Take 20 mEq by mouth daily. , Disp: , Rfl:  .  SYMBICORT 80-4.5 MCG/ACT inhaler, Inhale 2 puffs into the lungs daily as needed. , Disp: , Rfl:  .  tamsulosin (FLOMAX) 0.4 MG CAPS capsule, Take 0.4 mg by mouth daily., Disp: , Rfl:  .  torsemide (DEMADEX) 20 MG tablet, Take 2 tablets by mouth every morning and 1 tablet by mouth every evening, Disp: , Rfl:  .  VENTOLIN HFA 108 (90 Base) MCG/ACT inhaler, Inhale 2 puffs into the lungs every 6 (six) hours as needed for wheezing or shortness of breath. , Disp: , Rfl:  Allergies  Allergen Reactions  . Cashew Nut Oil Palpitations  . Percocet [Oxycodone-Acetaminophen] Other (See Comments)    Hallucintaions      Social History   Socioeconomic History  . Marital status: Married    Spouse name: Not on file  . Number of children: Not on file  . Years of education: Not on file  . Highest education level: Not on file  Occupational History  . Not on file  Tobacco Use  . Smoking status: Never Smoker  . Smokeless tobacco: Never Used  Substance and Sexual Activity  . Alcohol use: No  . Drug use: No  . Sexual activity: Not Currently    Partners: Female    Birth control/protection: None  Other Topics Concern  . Not on file  Social History Narrative   Patient lives in private residence with supportive spouse   Social Determinants of Health   Financial Resource Strain:   . Difficulty of Paying Living Expenses:   Food Insecurity:   . Worried About Charity fundraiser in the Last Year:   . Arboriculturist in the Last Year:   Transportation Needs:   . Film/video editor (Medical):   Marland Kitchen Lack of Transportation (Non-Medical):   Physical Activity:   . Days of Exercise per Week:   . Minutes of Exercise per Session:   Stress:   . Feeling of Stress :   Social Connections:   . Frequency of Communication with Friends and Family:   . Frequency of Social Gatherings with Friends and Family:    . Attends Religious Services:   . Active Member of Clubs or Organizations:   . Attends Archivist Meetings:   Marland Kitchen Marital Status:   Intimate Partner Violence:   . Fear of Current or Ex-Partner:   . Emotionally Abused:   Marland Kitchen Physically Abused:   . Sexually Abused:     Physical Exam      Future Appointments  Date Time Provider [place]  12/03/2019 11:40 AM Larey Dresser, MD [facility] None  12/09/2019 10:15 AM Gardiner Barefoot, DPM [facility]    BP 116/80   Pulse 78   Temp 98.2 F (36.8 C)   Resp 16   Wt 211 lb (95.7 kg)   SpO2 96%   BMI 33.05 kg/m  CBG EMS-263 Weight yesterday-211 Last visit weight-211  Pt reports he is doing well, he denies increased sob, no c/p, no dizziness. They got the vaccine on Saturday and have felt ok.  No missed doses of his meds.  meds verified and pill box refilled.  No edema noted. Legs look good. He does have some scabs on there but no drainage noted.  He goes to PCP in April for A1C check.    [name], [place] [facility] Paramedic  09/16/19

## 2019-09-23 ENCOUNTER — Other Ambulatory Visit (HOSPITAL_COMMUNITY): Payer: Self-pay

## 2019-09-23 NOTE — Progress Notes (Signed)
Paramedicine Encounter    Patient ID: Freddi Schrager, male    DOB: 10-Apr-1951, 69 y.o.   MRN: [id number]   Patient Care Team: Clinic, Medical, MD (Inactive) as PCP - General Sherren Mocha, MD as PCP - Cardiology (Cardiology) Larey Dresser, MD as PCP - Advanced Heart Failure (Cardiology)  Patient Active Problem List   Diagnosis Date Noted  . Hallux limitus of left foot 06/05/2019  . Hallux limitus of right foot 06/05/2019  . Seasonal allergies 02/12/2019  . Health care maintenance 02/12/2019  . GERD (gastroesophageal reflux disease) 02/12/2019  . Nocturnal hypoxemia 02/12/2019  . OSA (obstructive sleep apnea) 02/12/2019  . Pain due to onychomycosis of toenails of both feet 01/30/2019  . Lobar pneumonia ([place]) 12/29/2018  . Diabetes mellitus type 2, controlled, without complications ([place]) 65/99/3570  . Thoracic aortic aneurysm ([place]) 12/29/2018  . Acute on chronic combined systolic and diastolic CHF (congestive heart failure) ([place]) 12/25/2018  . Syncope 12/25/2018  . Chronic systolic heart failure ([place]) 11/19/2018  . Chronic venous insufficiency 06/24/2018  . Respiratory failure ([place]) 06/01/2018  . Leg swelling 05/06/2018  . Acute respiratory failure with hypoxemia ([place]) 10/14/2017  . Influenza-like illness 10/14/2017  . Delirium 10/14/2017  . Acute urinary retention 10/14/2017  . (HFpEF) heart failure with preserved ejection fraction ([place]), pulmonary edema and LBBB of unknown chronicity  10/29/2016  . Elevated troponin I level, presume demand ischemia  10/29/2016  . AKI (acute kidney injury) ([place]) 10/29/2016  . BPH (benign prostatic hyperplasia) 10/29/2016  . Sepsis ([place]) 10/29/2016  . Bacteremia due to Escherichia coli 10/29/2016  . Fatty liver 10/29/2016  . Acute pulmonary edema (HCC)   . Acute hypoxic and hypercarbic respiratory failure (HCC) in setting of bilateral pulmonary infiltrates. Presume CAP + pulmonary edema +/-ALI 10/27/2016  . Left-sided weakness 05/04/2015  .  TIA (transient ischemic attack) : Rule out. 05/04/2015  . Hyperlipidemia 05/04/2015  . Chest pain 10/10/2013  . SOB (shortness of breath) 10/10/2013  . Hyperglycemia without ketosis 10/17/2011  . HTN (hypertension) 10/17/2011  . Foot drop, left 10/17/2011  . Impacted cerumen of right ear 10/17/2011  . Dyspnea 10/17/2011  . History of TIA (transient ischemic attack) 10/17/2011  . Neuropathy ([place]) 10/17/2011  . Aortic insufficiency 03/16/2011  . Chest pain 03/16/2011    Current Outpatient Medications:  .  Accu-Chek FastClix Lancets MISC, USE UTD, Disp: , Rfl:  .  ACCU-CHEK GUIDE test strip, U TO TEST BLOOD SUGAR QD, Disp: , Rfl:  .  aspirin EC 81 MG tablet, Take 81 mg by mouth daily., Disp: , Rfl:  .  atorvastatin (LIPITOR) 40 MG tablet, Take 1 tablet (40 mg total) by mouth every evening., Disp: 30 tablet, Rfl: 11 .  Blood Glucose Monitoring Suppl (ACCU-CHEK GUIDE) w/Device KIT, U TO TEST BLOOD SUGAR QD, Disp: , Rfl:  .  carvedilol (COREG) 12.5 MG tablet, Take 1 tablet (12.5 mg total) by mouth 2 (two) times daily., Disp: 60 tablet, Rfl: 5 .  famotidine (PEPCID) 20 MG tablet, Take 20 mg by mouth at bedtime., Disp: , Rfl:  .  gabapentin (NEURONTIN) 300 MG capsule, Take 300 mg by mouth 4 (four) times daily. , Disp: , Rfl:  .  hydrOXYzine (VISTARIL) 50 MG capsule, Take 50 mg by mouth at bedtime. , Disp: , Rfl:  .  metFORMIN (GLUCOPHAGE) 1000 MG tablet, Take 1,000 mg by mouth 2 (two) times a day. , Disp: , Rfl:  .  montelukast (SINGULAIR) 10 MG tablet, Take 10 mg by mouth  daily. , Disp: , Rfl:  .  Multiple Vitamin (MULTIVITAMIN) tablet, Take 1 tablet by mouth daily., Disp: , Rfl:  .  pantoprazole (PROTONIX) 40 MG tablet, Take 1 tablet (40 mg total) by mouth daily., Disp: 30 tablet, Rfl: 1 .  potassium chloride SA (KLOR-CON) 20 MEQ tablet, Take 20 mEq by mouth daily. , Disp: , Rfl:  .  SYMBICORT 80-4.5 MCG/ACT inhaler, Inhale 2 puffs into the lungs daily as needed. , Disp: , Rfl:  .   tamsulosin (FLOMAX) 0.4 MG CAPS capsule, Take 0.4 mg by mouth daily., Disp: , Rfl:  .  torsemide (DEMADEX) 20 MG tablet, Take 2 tablets by mouth every morning and 1 tablet by mouth every evening, Disp: , Rfl:  .  VENTOLIN HFA 108 (90 Base) MCG/ACT inhaler, Inhale 2 puffs into the lungs every 6 (six) hours as needed for wheezing or shortness of breath. , Disp: , Rfl:  .  Dextromethorphan HBr (ROBITUSSIN LINGERING COUGHGELS) 15 MG CAPS, Take 15 mg by mouth daily as needed (cough)., Disp: , Rfl:  .  finasteride (PROSCAR) 5 MG tablet, Take 5 mg by mouth daily., Disp: , Rfl:  Allergies  Allergen Reactions  . Cashew Nut Oil Palpitations  . Percocet [Oxycodone-Acetaminophen] Other (See Comments)    Hallucintaions      Social History   Socioeconomic History  . Marital status: Married    Spouse name: Not on file  . Number of children: Not on file  . Years of education: Not on file  . Highest education level: Not on file  Occupational History  . Not on file  Tobacco Use  . Smoking status: Never Smoker  . Smokeless tobacco: Never Used  Substance and Sexual Activity  . Alcohol use: No  . Drug use: No  . Sexual activity: Not Currently    Partners: Female    Birth control/protection: None  Other Topics Concern  . Not on file  Social History Narrative   Patient lives in private residence with supportive spouse   Social Determinants of Health   Financial Resource Strain:   . Difficulty of Paying Living Expenses:   Food Insecurity:   . Worried About Charity fundraiser in the Last Year:   . Arboriculturist in the Last Year:   Transportation Needs:   . Film/video editor (Medical):   Marland Kitchen Lack of Transportation (Non-Medical):   Physical Activity:   . Days of Exercise per Week:   . Minutes of Exercise per Session:   Stress:   . Feeling of Stress :   Social Connections:   . Frequency of Communication with Friends and Family:   . Frequency of Social Gatherings with Friends and  Family:   . Attends Religious Services:   . Active Member of Clubs or Organizations:   . Attends Archivist Meetings:   Marland Kitchen Marital Status:   Intimate Partner Violence:   . Fear of Current or Ex-Partner:   . Emotionally Abused:   Marland Kitchen Physically Abused:   . Sexually Abused:     Physical Exam      Future Appointments  Date Time Provider [place]  12/03/2019 11:40 AM Larey Dresser, MD [facility] None  12/09/2019 10:15 AM Gardiner Barefoot, DPM [facility]    BP 116/80   Pulse 68   Temp 98.1 F (36.7 C)   Resp 18   Wt 210 lb (95.3 kg)   SpO2 94%   BMI 32.89 kg/m  CBG EMS-127  Weight yesterday-210 Last visit weight-211  Pt reports he is doing ok, he denies increased sob, no c/p, no dizziness. No missed doses of his meds. Weight stable.  No edema noted. Still wearing leg wraps that his wife puts on him. meds verified and pill box refilled. Lungs clear. No issues or complaints.   [name], [place] [facility] Paramedic  09/23/19

## 2019-09-24 ENCOUNTER — Encounter (HOSPITAL_COMMUNITY): Payer: Self-pay | Admitting: Adult Health

## 2019-09-24 ENCOUNTER — Telehealth (HOSPITAL_COMMUNITY): Payer: Self-pay | Admitting: Adult Health

## 2019-09-24 NOTE — Telephone Encounter (Signed)
I called and left message.  Cardiomems Reading up to 21>17>25  Increase torsemide to 40 mg twice a day x 2 days then back torsemide 40 mg/20 mg.   Amy Clegg NP-C '3:11 PM

## 2019-09-25 NOTE — Telephone Encounter (Signed)
Pts wife called back she is aware and agreeable with plan

## 2019-09-30 ENCOUNTER — Other Ambulatory Visit (HOSPITAL_COMMUNITY): Payer: Self-pay

## 2019-09-30 NOTE — Progress Notes (Signed)
Paramedicine Encounter    Patient ID: [name], male    DOB: 03-09-1951, 69 y.o.   MRN: [id number]   Patient Care Team: Clinic, Medical, MD (Inactive) as PCP - General Sherren Mocha, MD as PCP - Cardiology (Cardiology) Larey Dresser, MD as PCP - Advanced Heart Failure (Cardiology)  Patient Active Problem List   Diagnosis Date Noted  . Hallux limitus of left foot 06/05/2019  . Hallux limitus of right foot 06/05/2019  . Seasonal allergies 02/12/2019  . Health care maintenance 02/12/2019  . GERD (gastroesophageal reflux disease) 02/12/2019  . Nocturnal hypoxemia 02/12/2019  . OSA (obstructive sleep apnea) 02/12/2019  . Pain due to onychomycosis of toenails of both feet 01/30/2019  . Lobar pneumonia ([place]) 12/29/2018  . Diabetes mellitus type 2, controlled, without complications ([place]) 37/34/2876  . Thoracic aortic aneurysm ([place]) 12/29/2018  . Acute on chronic combined systolic and diastolic CHF (congestive heart failure) ([place]) 12/25/2018  . Syncope 12/25/2018  . Chronic systolic heart failure ([place]) 11/19/2018  . Chronic venous insufficiency 06/24/2018  . Respiratory failure ([place]) 06/01/2018  . Leg swelling 05/06/2018  . Acute respiratory failure with hypoxemia ([place]) 10/14/2017  . Influenza-like illness 10/14/2017  . Delirium 10/14/2017  . Acute urinary retention 10/14/2017  . (HFpEF) heart failure with preserved ejection fraction ([place]), pulmonary edema and LBBB of unknown chronicity  10/29/2016  . Elevated troponin I level, presume demand ischemia  10/29/2016  . AKI (acute kidney injury) ([place]) 10/29/2016  . BPH (benign prostatic hyperplasia) 10/29/2016  . Sepsis ([place]) 10/29/2016  . Bacteremia due to Escherichia coli 10/29/2016  . Fatty liver 10/29/2016  . Acute pulmonary edema (HCC)   . Acute hypoxic and hypercarbic respiratory failure (HCC) in setting of bilateral pulmonary infiltrates. Presume CAP + pulmonary edema +/-ALI 10/27/2016  . Left-sided weakness 05/04/2015  .  TIA (transient ischemic attack) : Rule out. 05/04/2015  . Hyperlipidemia 05/04/2015  . Chest pain 10/10/2013  . SOB (shortness of breath) 10/10/2013  . Hyperglycemia without ketosis 10/17/2011  . HTN (hypertension) 10/17/2011  . Foot drop, left 10/17/2011  . Impacted cerumen of right ear 10/17/2011  . Dyspnea 10/17/2011  . History of TIA (transient ischemic attack) 10/17/2011  . Neuropathy ([place]) 10/17/2011  . Aortic insufficiency 03/16/2011  . Chest pain 03/16/2011    Current Outpatient Medications:  .  Accu-Chek FastClix Lancets MISC, USE UTD, Disp: , Rfl:  .  ACCU-CHEK GUIDE test strip, U TO TEST BLOOD SUGAR QD, Disp: , Rfl:  .  aspirin EC 81 MG tablet, Take 81 mg by mouth daily., Disp: , Rfl:  .  atorvastatin (LIPITOR) 40 MG tablet, Take 1 tablet (40 mg total) by mouth every evening. (Patient taking differently: Take 40 mg by mouth every evening. ), Disp: 30 tablet, Rfl: 11 .  Blood Glucose Monitoring Suppl (ACCU-CHEK GUIDE) w/Device KIT, U TO TEST BLOOD SUGAR QD, Disp: , Rfl:  .  carvedilol (COREG) 12.5 MG tablet, Take 1 tablet (12.5 mg total) by mouth 2 (two) times daily., Disp: 60 tablet, Rfl: 5 .  famotidine (PEPCID) 20 MG tablet, Take 20 mg by mouth at bedtime., Disp: , Rfl:  .  finasteride (PROSCAR) 5 MG tablet, Take 5 mg by mouth daily., Disp: , Rfl:  .  gabapentin (NEURONTIN) 300 MG capsule, Take 300 mg by mouth 4 (four) times daily. , Disp: , Rfl:  .  hydrOXYzine (VISTARIL) 50 MG capsule, Take 50 mg by mouth at bedtime. , Disp: , Rfl:  .  metFORMIN (GLUCOPHAGE) 1000 MG  tablet, Take 1,000 mg by mouth 2 (two) times a day. , Disp: , Rfl:  .  montelukast (SINGULAIR) 10 MG tablet, Take 10 mg by mouth daily. , Disp: , Rfl:  .  Multiple Vitamin (MULTIVITAMIN) tablet, Take 1 tablet by mouth daily., Disp: , Rfl:  .  pantoprazole (PROTONIX) 40 MG tablet, Take 1 tablet (40 mg total) by mouth daily., Disp: 30 tablet, Rfl: 1 .  potassium chloride SA (KLOR-CON) 20 MEQ tablet, Take 20 mEq  by mouth daily. , Disp: , Rfl:  .  SYMBICORT 80-4.5 MCG/ACT inhaler, Inhale 2 puffs into the lungs daily as needed. , Disp: , Rfl:  .  tamsulosin (FLOMAX) 0.4 MG CAPS capsule, Take 0.4 mg by mouth daily., Disp: , Rfl:  .  torsemide (DEMADEX) 20 MG tablet, Take 2 tablets by mouth every morning and 1 tablet by mouth every evening , Disp: , Rfl:  .  VENTOLIN HFA 108 (90 Base) MCG/ACT inhaler, Inhale 2 puffs into the lungs every 6 (six) hours as needed for wheezing or shortness of breath. , Disp: , Rfl:  .  Dextromethorphan HBr (ROBITUSSIN LINGERING COUGHGELS) 15 MG CAPS, Take 15 mg by mouth daily as needed (cough)., Disp: , Rfl:  Allergies  Allergen Reactions  . Cashew Nut Oil Palpitations  . Percocet [Oxycodone-Acetaminophen] Other (See Comments)    Hallucintaions      Social History   Socioeconomic History  . Marital status: Married    Spouse name: Not on file  . Number of children: Not on file  . Years of education: Not on file  . Highest education level: Not on file  Occupational History  . Not on file  Tobacco Use  . Smoking status: Never Smoker  . Smokeless tobacco: Never Used  Substance and Sexual Activity  . Alcohol use: No  . Drug use: No  . Sexual activity: Not Currently    Partners: Female    Birth control/protection: None  Other Topics Concern  . Not on file  Social History Narrative   Patient lives in private residence with supportive spouse   Social Determinants of Health   Financial Resource Strain:   . Difficulty of Paying Living Expenses:   Food Insecurity:   . Worried About Charity fundraiser in the Last Year:   . Arboriculturist in the Last Year:   Transportation Needs:   . Film/video editor (Medical):   Marland Kitchen Lack of Transportation (Non-Medical):   Physical Activity:   . Days of Exercise per Week:   . Minutes of Exercise per Session:   Stress:   . Feeling of Stress :   Social Connections:   . Frequency of Communication with Friends and Family:    . Frequency of Social Gatherings with Friends and Family:   . Attends Religious Services:   . Active Member of Clubs or Organizations:   . Attends Archivist Meetings:   Marland Kitchen Marital Status:   Intimate Partner Violence:   . Fear of Current or Ex-Partner:   . Emotionally Abused:   Marland Kitchen Physically Abused:   . Sexually Abused:     Physical Exam      Future Appointments  Date Time Provider [place]  10/05/2019  9:30 AM [facility] None  12/03/2019 11:40 AM Larey Dresser, MD [facility] None  12/09/2019 10:15 AM Gardiner Barefoot, DPM [facility]  02/29/2020  1:45 PM Rankin, Clent Demark, MD [facility] None    BP 130/76  Pulse 66   Temp 97.9 F (36.6 C)   Resp 18   Wt 207 lb (93.9 kg)   SpO2 94%   BMI 32.42 kg/m  CBG EMS-137 Weight yesterday-209 Last visit weight-210  Pt reports he was told by clinic to increase his torsemide with cardiomems reading of increased fluid.  He did that increase dose and his weight came down about 4lbs.  He denies any sob, no dizziness, no c/p, using CPAP about 4-5 hrs. No edema noted. Lungs clear.  No missed doses of his meds, meds verified and pill box refilled.  Nira Conn will see him next week since I will be off work.   [name], [facility] Paramedic  09/30/19

## 2019-10-05 ENCOUNTER — Other Ambulatory Visit: Payer: Self-pay

## 2019-10-05 ENCOUNTER — Ambulatory Visit (HOSPITAL_COMMUNITY): Admission: RE | Admit: 2019-10-05 | Payer: Medicare Other | Source: Ambulatory Visit

## 2019-10-07 ENCOUNTER — Other Ambulatory Visit (HOSPITAL_COMMUNITY): Payer: Self-pay

## 2019-10-07 NOTE — Progress Notes (Signed)
Paramedicine Encounter    Patient ID: [name], male    DOB: 03-09-1951, 69 y.o.   MRN: [id number]   Patient Care Team: Clinic, Medical, MD (Inactive) as PCP - General Sherren Mocha, MD as PCP - Cardiology (Cardiology) Larey Dresser, MD as PCP - Advanced Heart Failure (Cardiology)  Patient Active Problem List   Diagnosis Date Noted  . Hallux limitus of left foot 06/05/2019  . Hallux limitus of right foot 06/05/2019  . Seasonal allergies 02/12/2019  . Health care maintenance 02/12/2019  . GERD (gastroesophageal reflux disease) 02/12/2019  . Nocturnal hypoxemia 02/12/2019  . OSA (obstructive sleep apnea) 02/12/2019  . Pain due to onychomycosis of toenails of both feet 01/30/2019  . Lobar pneumonia ([place]) 12/29/2018  . Diabetes mellitus type 2, controlled, without complications ([place]) 37/34/2876  . Thoracic aortic aneurysm ([place]) 12/29/2018  . Acute on chronic combined systolic and diastolic CHF (congestive heart failure) ([place]) 12/25/2018  . Syncope 12/25/2018  . Chronic systolic heart failure ([place]) 11/19/2018  . Chronic venous insufficiency 06/24/2018  . Respiratory failure ([place]) 06/01/2018  . Leg swelling 05/06/2018  . Acute respiratory failure with hypoxemia ([place]) 10/14/2017  . Influenza-like illness 10/14/2017  . Delirium 10/14/2017  . Acute urinary retention 10/14/2017  . (HFpEF) heart failure with preserved ejection fraction ([place]), pulmonary edema and LBBB of unknown chronicity  10/29/2016  . Elevated troponin I level, presume demand ischemia  10/29/2016  . AKI (acute kidney injury) ([place]) 10/29/2016  . BPH (benign prostatic hyperplasia) 10/29/2016  . Sepsis ([place]) 10/29/2016  . Bacteremia due to Escherichia coli 10/29/2016  . Fatty liver 10/29/2016  . Acute pulmonary edema (HCC)   . Acute hypoxic and hypercarbic respiratory failure (HCC) in setting of bilateral pulmonary infiltrates. Presume CAP + pulmonary edema +/-ALI 10/27/2016  . Left-sided weakness 05/04/2015  .  TIA (transient ischemic attack) : Rule out. 05/04/2015  . Hyperlipidemia 05/04/2015  . Chest pain 10/10/2013  . SOB (shortness of breath) 10/10/2013  . Hyperglycemia without ketosis 10/17/2011  . HTN (hypertension) 10/17/2011  . Foot drop, left 10/17/2011  . Impacted cerumen of right ear 10/17/2011  . Dyspnea 10/17/2011  . History of TIA (transient ischemic attack) 10/17/2011  . Neuropathy ([place]) 10/17/2011  . Aortic insufficiency 03/16/2011  . Chest pain 03/16/2011    Current Outpatient Medications:  .  Accu-Chek FastClix Lancets MISC, USE UTD, Disp: , Rfl:  .  ACCU-CHEK GUIDE test strip, U TO TEST BLOOD SUGAR QD, Disp: , Rfl:  .  aspirin EC 81 MG tablet, Take 81 mg by mouth daily., Disp: , Rfl:  .  atorvastatin (LIPITOR) 40 MG tablet, Take 1 tablet (40 mg total) by mouth every evening. (Patient taking differently: Take 40 mg by mouth every evening. ), Disp: 30 tablet, Rfl: 11 .  Blood Glucose Monitoring Suppl (ACCU-CHEK GUIDE) w/Device KIT, U TO TEST BLOOD SUGAR QD, Disp: , Rfl:  .  carvedilol (COREG) 12.5 MG tablet, Take 1 tablet (12.5 mg total) by mouth 2 (two) times daily., Disp: 60 tablet, Rfl: 5 .  famotidine (PEPCID) 20 MG tablet, Take 20 mg by mouth at bedtime., Disp: , Rfl:  .  finasteride (PROSCAR) 5 MG tablet, Take 5 mg by mouth daily., Disp: , Rfl:  .  gabapentin (NEURONTIN) 300 MG capsule, Take 300 mg by mouth 4 (four) times daily. , Disp: , Rfl:  .  hydrOXYzine (VISTARIL) 50 MG capsule, Take 50 mg by mouth at bedtime. , Disp: , Rfl:  .  metFORMIN (GLUCOPHAGE) 1000 MG  tablet, Take 1,000 mg by mouth 2 (two) times a day. , Disp: , Rfl:  .  montelukast (SINGULAIR) 10 MG tablet, Take 10 mg by mouth daily. , Disp: , Rfl:  .  Multiple Vitamin (MULTIVITAMIN) tablet, Take 1 tablet by mouth daily., Disp: , Rfl:  .  pantoprazole (PROTONIX) 40 MG tablet, Take 1 tablet (40 mg total) by mouth daily., Disp: 30 tablet, Rfl: 1 .  potassium chloride SA (KLOR-CON) 20 MEQ tablet, Take 20 mEq  by mouth daily. , Disp: , Rfl:  .  SYMBICORT 80-4.5 MCG/ACT inhaler, Inhale 2 puffs into the lungs daily as needed. , Disp: , Rfl:  .  tamsulosin (FLOMAX) 0.4 MG CAPS capsule, Take 0.4 mg by mouth daily., Disp: , Rfl:  .  torsemide (DEMADEX) 20 MG tablet, Take 2 tablets by mouth every morning and 1 tablet by mouth every evening , Disp: , Rfl:  .  VENTOLIN HFA 108 (90 Base) MCG/ACT inhaler, Inhale 2 puffs into the lungs every 6 (six) hours as needed for wheezing or shortness of breath. , Disp: , Rfl:  .  Dextromethorphan HBr (ROBITUSSIN LINGERING COUGHGELS) 15 MG CAPS, Take 15 mg by mouth daily as needed (cough)., Disp: , Rfl:  Allergies  Allergen Reactions  . Cashew Nut Oil Palpitations  . Percocet [Oxycodone-Acetaminophen] Other (See Comments)    Hallucintaions     Social History   Socioeconomic History  . Marital status: Married    Spouse name: Not on file  . Number of children: Not on file  . Years of education: Not on file  . Highest education level: Not on file  Occupational History  . Not on file  Tobacco Use  . Smoking status: Never Smoker  . Smokeless tobacco: Never Used  Substance and Sexual Activity  . Alcohol use: No  . Drug use: No  . Sexual activity: Not Currently    Partners: Female    Birth control/protection: None  Other Topics Concern  . Not on file  Social History Narrative   Patient lives in private residence with supportive spouse   Social Determinants of Health   Financial Resource Strain:   . Difficulty of Paying Living Expenses:   Food Insecurity:   . Worried About Charity fundraiser in the Last Year:   . Arboriculturist in the Last Year:   Transportation Needs:   . Film/video editor (Medical):   Marland Kitchen Lack of Transportation (Non-Medical):   Physical Activity:   . Days of Exercise per Week:   . Minutes of Exercise per Session:   Stress:   . Feeling of Stress :   Social Connections:   . Frequency of Communication with Friends and Family:    . Frequency of Social Gatherings with Friends and Family:   . Attends Religious Services:   . Active Member of Clubs or Organizations:   . Attends Archivist Meetings:   Marland Kitchen Marital Status:   Intimate Partner Violence:   . Fear of Current or Ex-Partner:   . Emotionally Abused:   Marland Kitchen Physically Abused:   . Sexually Abused:     Physical Exam Vitals reviewed.  HENT:     Head: Normocephalic.     Nose: Nose normal.     Mouth/Throat:     Mouth: Mucous membranes are moist.     Pharynx: Oropharynx is clear.  Eyes:     Pupils: Pupils are equal, round, and reactive to light.  Cardiovascular:  Rate and Rhythm: Normal rate and regular rhythm.  Pulmonary:     Effort: Pulmonary effort is normal.     Breath sounds: Normal breath sounds.  Abdominal:     General: Abdomen is flat.     Palpations: Abdomen is soft.  Musculoskeletal:        General: Normal range of motion.     Cervical back: Normal range of motion and neck supple.     Comments: Both legs wrapped by wound care.   Skin:    General: Skin is warm and dry.     Capillary Refill: Capillary refill takes less than 2 seconds.  Neurological:     Mental Status: He is alert. Mental status is at baseline.  Psychiatric:        Mood and Affect: Mood normal.     Arrived for home visit for Syon who was seated in a chair at the kitchen table alert and oriented. Trevino denied shortness of breath, dizziness, chest pain or increased weight gain. Skyelar was complaint with medications for the last week. Medications were reviewed and confirmed. Pill box filled accordingly. Weight noted to be up 5 lbs from last week, he stated he ate a decent meal on Sunday and that may be contributing to that. Vitals were obtained and are as noted in report. No refills needed at this time. Both legs wrapped by wound care per patient. Lung sounds clear and equal. Home visit complete. Joellen Jersey will follow up in one week.   CBG- 175 Weight today-  212lbs Weight last week- 207lbs     Future Appointments  Date Time Provider [place]  12/03/2019 11:40 AM Larey Dresser, MD [facility] None  12/09/2019 10:15 AM Gardiner Barefoot, DPM [facility]  02/29/2020  1:45 PM Rankin, Clent Demark, MD [facility] None     ACTION: Home visit completed Next visit planned for one week with Katie.

## 2019-10-14 ENCOUNTER — Other Ambulatory Visit (HOSPITAL_COMMUNITY): Payer: Self-pay

## 2019-10-14 NOTE — Progress Notes (Signed)
Paramedicine Encounter    Patient ID: [name], male    DOB: 02/09/1951, 69 y.o.   MRN: [id number]   Patient Care Team: Clinic, Medical, MD (Inactive) as PCP - General Sherren Mocha, MD as PCP - Cardiology (Cardiology) Larey Dresser, MD as PCP - Advanced Heart Failure (Cardiology)  Patient Active Problem List   Diagnosis Date Noted  . Hallux limitus of left foot 06/05/2019  . Hallux limitus of right foot 06/05/2019  . Seasonal allergies 02/12/2019  . Health care maintenance 02/12/2019  . GERD (gastroesophageal reflux disease) 02/12/2019  . Nocturnal hypoxemia 02/12/2019  . OSA (obstructive sleep apnea) 02/12/2019  . Pain due to onychomycosis of toenails of both feet 01/30/2019  . Lobar pneumonia ([place]) 12/29/2018  . Diabetes mellitus type 2, controlled, without complications ([place]) 12/87/8676  . Thoracic aortic aneurysm ([place]) 12/29/2018  . Acute on chronic combined systolic and diastolic CHF (congestive heart failure) ([place]) 12/25/2018  . Syncope 12/25/2018  . Chronic systolic heart failure ([place]) 11/19/2018  . Chronic venous insufficiency 06/24/2018  . Respiratory failure ([place]) 06/01/2018  . Leg swelling 05/06/2018  . Acute respiratory failure with hypoxemia ([place]) 10/14/2017  . Influenza-like illness 10/14/2017  . Delirium 10/14/2017  . Acute urinary retention 10/14/2017  . (HFpEF) heart failure with preserved ejection fraction ([place]), pulmonary edema and LBBB of unknown chronicity  10/29/2016  . Elevated troponin I level, presume demand ischemia  10/29/2016  . AKI (acute kidney injury) ([place]) 10/29/2016  . BPH (benign prostatic hyperplasia) 10/29/2016  . Sepsis ([place]) 10/29/2016  . Bacteremia due to Escherichia coli 10/29/2016  . Fatty liver 10/29/2016  . Acute pulmonary edema (HCC)   . Acute hypoxic and hypercarbic respiratory failure (HCC) in setting of bilateral pulmonary infiltrates. Presume CAP + pulmonary edema +/-ALI 10/27/2016  . Left-sided weakness 05/04/2015  .  TIA (transient ischemic attack) : Rule out. 05/04/2015  . Hyperlipidemia 05/04/2015  . Chest pain 10/10/2013  . SOB (shortness of breath) 10/10/2013  . Hyperglycemia without ketosis 10/17/2011  . HTN (hypertension) 10/17/2011  . Foot drop, left 10/17/2011  . Impacted cerumen of right ear 10/17/2011  . Dyspnea 10/17/2011  . History of TIA (transient ischemic attack) 10/17/2011  . Neuropathy ([place]) 10/17/2011  . Aortic insufficiency 03/16/2011  . Chest pain 03/16/2011    Current Outpatient Medications:  .  Accu-Chek FastClix Lancets MISC, USE UTD, Disp: , Rfl:  .  ACCU-CHEK GUIDE test strip, U TO TEST BLOOD SUGAR QD, Disp: , Rfl:  .  aspirin EC 81 MG tablet, Take 81 mg by mouth daily., Disp: , Rfl:  .  atorvastatin (LIPITOR) 40 MG tablet, Take 1 tablet (40 mg total) by mouth every evening. (Patient taking differently: Take 40 mg by mouth every evening. ), Disp: 30 tablet, Rfl: 11 .  Blood Glucose Monitoring Suppl (ACCU-CHEK GUIDE) w/Device KIT, U TO TEST BLOOD SUGAR QD, Disp: , Rfl:  .  carvedilol (COREG) 12.5 MG tablet, Take 1 tablet (12.5 mg total) by mouth 2 (two) times daily., Disp: 60 tablet, Rfl: 5 .  Dextromethorphan HBr (ROBITUSSIN LINGERING COUGHGELS) 15 MG CAPS, Take 15 mg by mouth daily as needed (cough)., Disp: , Rfl:  .  famotidine (PEPCID) 20 MG tablet, Take 20 mg by mouth at bedtime., Disp: , Rfl:  .  finasteride (PROSCAR) 5 MG tablet, Take 5 mg by mouth daily., Disp: , Rfl:  .  gabapentin (NEURONTIN) 300 MG capsule, Take 300 mg by mouth 4 (four) times daily. , Disp: , Rfl:  .  hydrOXYzine (VISTARIL) 50 MG capsule, Take 50 mg by mouth at bedtime. , Disp: , Rfl:  .  metFORMIN (GLUCOPHAGE) 1000 MG tablet, Take 1,000 mg by mouth 2 (two) times a day. , Disp: , Rfl:  .  montelukast (SINGULAIR) 10 MG tablet, Take 10 mg by mouth daily. , Disp: , Rfl:  .  Multiple Vitamin (MULTIVITAMIN) tablet, Take 1 tablet by mouth daily., Disp: , Rfl:  .  pantoprazole (PROTONIX) 40 MG tablet,  Take 1 tablet (40 mg total) by mouth daily., Disp: 30 tablet, Rfl: 1 .  potassium chloride SA (KLOR-CON) 20 MEQ tablet, Take 20 mEq by mouth daily. , Disp: , Rfl:  .  SYMBICORT 80-4.5 MCG/ACT inhaler, Inhale 2 puffs into the lungs daily as needed. , Disp: , Rfl:  .  tamsulosin (FLOMAX) 0.4 MG CAPS capsule, Take 0.4 mg by mouth daily., Disp: , Rfl:  .  torsemide (DEMADEX) 20 MG tablet, Take 2 tablets by mouth every morning and 1 tablet by mouth every evening , Disp: , Rfl:  .  VENTOLIN HFA 108 (90 Base) MCG/ACT inhaler, Inhale 2 puffs into the lungs every 6 (six) hours as needed for wheezing or shortness of breath. , Disp: , Rfl:  Allergies  Allergen Reactions  . Cashew Nut Oil Palpitations  . Percocet [Oxycodone-Acetaminophen] Other (See Comments)    Hallucintaions      Social History   Socioeconomic History  . Marital status: Married    Spouse name: Not on file  . Number of children: Not on file  . Years of education: Not on file  . Highest education level: Not on file  Occupational History  . Not on file  Tobacco Use  . Smoking status: Never Smoker  . Smokeless tobacco: Never Used  Substance and Sexual Activity  . Alcohol use: No  . Drug use: No  . Sexual activity: Not Currently    Partners: Female    Birth control/protection: None  Other Topics Concern  . Not on file  Social History Narrative   Patient lives in private residence with supportive spouse   Social Determinants of Health   Financial Resource Strain:   . Difficulty of Paying Living Expenses:   Food Insecurity:   . Worried About Charity fundraiser in the Last Year:   . Arboriculturist in the Last Year:   Transportation Needs:   . Film/video editor (Medical):   Marland Kitchen Lack of Transportation (Non-Medical):   Physical Activity:   . Days of Exercise per Week:   . Minutes of Exercise per Session:   Stress:   . Feeling of Stress :   Social Connections:   . Frequency of Communication with Friends and  Family:   . Frequency of Social Gatherings with Friends and Family:   . Attends Religious Services:   . Active Member of Clubs or Organizations:   . Attends Archivist Meetings:   Marland Kitchen Marital Status:   Intimate Partner Violence:   . Fear of Current or Ex-Partner:   . Emotionally Abused:   Marland Kitchen Physically Abused:   . Sexually Abused:     Physical Exam      Future Appointments  Date Time Provider [place]  12/03/2019 11:40 AM Larey Dresser, MD [facility] None  12/09/2019 10:15 AM Gardiner Barefoot, DPM [facility]  02/29/2020  1:45 PM Rankin, Clent Demark, MD [facility] None    BP 118/72   Pulse 70   Temp 98.1 F (36.7 C)  Resp 18   Wt 209 lb (94.8 kg)   SpO2 97%   BMI 32.73 kg/m   Weight yesterday-209 Last visit weight-212  Pt reports he is doing well, he denies sob, no dizziness, no c/p, no missed doses of his meds.  meds verified and pill box refilled.  He denies coughing, no fevers.  -he is short of gabapentin for a day and half for fri and sat. Called it in to pharmacy and wife will be able to place in pill box where needed.  Ordered-gabapentin, famotidine, finasteride  Lungs clear, no edema noted.   [name], [facility] Paramedic  10/14/19

## 2019-10-21 ENCOUNTER — Other Ambulatory Visit (HOSPITAL_COMMUNITY): Payer: Self-pay

## 2019-10-21 NOTE — Progress Notes (Signed)
Paramedicine Encounter    Patient ID: [name], male    DOB: 03-09-1951, 69 y.o.   MRN: [id number]   Patient Care Team: Clinic, Medical, MD (Inactive) as PCP - General Sherren Mocha, MD as PCP - Cardiology (Cardiology) Larey Dresser, MD as PCP - Advanced Heart Failure (Cardiology)  Patient Active Problem List   Diagnosis Date Noted  . Hallux limitus of left foot 06/05/2019  . Hallux limitus of right foot 06/05/2019  . Seasonal allergies 02/12/2019  . Health care maintenance 02/12/2019  . GERD (gastroesophageal reflux disease) 02/12/2019  . Nocturnal hypoxemia 02/12/2019  . OSA (obstructive sleep apnea) 02/12/2019  . Pain due to onychomycosis of toenails of both feet 01/30/2019  . Lobar pneumonia ([place]) 12/29/2018  . Diabetes mellitus type 2, controlled, without complications ([place]) 37/34/2876  . Thoracic aortic aneurysm ([place]) 12/29/2018  . Acute on chronic combined systolic and diastolic CHF (congestive heart failure) ([place]) 12/25/2018  . Syncope 12/25/2018  . Chronic systolic heart failure ([place]) 11/19/2018  . Chronic venous insufficiency 06/24/2018  . Respiratory failure ([place]) 06/01/2018  . Leg swelling 05/06/2018  . Acute respiratory failure with hypoxemia ([place]) 10/14/2017  . Influenza-like illness 10/14/2017  . Delirium 10/14/2017  . Acute urinary retention 10/14/2017  . (HFpEF) heart failure with preserved ejection fraction ([place]), pulmonary edema and LBBB of unknown chronicity  10/29/2016  . Elevated troponin I level, presume demand ischemia  10/29/2016  . AKI (acute kidney injury) ([place]) 10/29/2016  . BPH (benign prostatic hyperplasia) 10/29/2016  . Sepsis ([place]) 10/29/2016  . Bacteremia due to Escherichia coli 10/29/2016  . Fatty liver 10/29/2016  . Acute pulmonary edema (HCC)   . Acute hypoxic and hypercarbic respiratory failure (HCC) in setting of bilateral pulmonary infiltrates. Presume CAP + pulmonary edema +/-ALI 10/27/2016  . Left-sided weakness 05/04/2015  .  TIA (transient ischemic attack) : Rule out. 05/04/2015  . Hyperlipidemia 05/04/2015  . Chest pain 10/10/2013  . SOB (shortness of breath) 10/10/2013  . Hyperglycemia without ketosis 10/17/2011  . HTN (hypertension) 10/17/2011  . Foot drop, left 10/17/2011  . Impacted cerumen of right ear 10/17/2011  . Dyspnea 10/17/2011  . History of TIA (transient ischemic attack) 10/17/2011  . Neuropathy ([place]) 10/17/2011  . Aortic insufficiency 03/16/2011  . Chest pain 03/16/2011    Current Outpatient Medications:  .  Accu-Chek FastClix Lancets MISC, USE UTD, Disp: , Rfl:  .  ACCU-CHEK GUIDE test strip, U TO TEST BLOOD SUGAR QD, Disp: , Rfl:  .  aspirin EC 81 MG tablet, Take 81 mg by mouth daily., Disp: , Rfl:  .  atorvastatin (LIPITOR) 40 MG tablet, Take 1 tablet (40 mg total) by mouth every evening. (Patient taking differently: Take 40 mg by mouth every evening. ), Disp: 30 tablet, Rfl: 11 .  Blood Glucose Monitoring Suppl (ACCU-CHEK GUIDE) w/Device KIT, U TO TEST BLOOD SUGAR QD, Disp: , Rfl:  .  carvedilol (COREG) 12.5 MG tablet, Take 1 tablet (12.5 mg total) by mouth 2 (two) times daily., Disp: 60 tablet, Rfl: 5 .  famotidine (PEPCID) 20 MG tablet, Take 20 mg by mouth at bedtime., Disp: , Rfl:  .  finasteride (PROSCAR) 5 MG tablet, Take 5 mg by mouth daily., Disp: , Rfl:  .  gabapentin (NEURONTIN) 300 MG capsule, Take 300 mg by mouth 4 (four) times daily. , Disp: , Rfl:  .  hydrOXYzine (VISTARIL) 50 MG capsule, Take 50 mg by mouth at bedtime. , Disp: , Rfl:  .  metFORMIN (GLUCOPHAGE) 1000 MG  tablet, Take 1,000 mg by mouth 2 (two) times a day. , Disp: , Rfl:  .  montelukast (SINGULAIR) 10 MG tablet, Take 10 mg by mouth daily. , Disp: , Rfl:  .  Multiple Vitamin (MULTIVITAMIN) tablet, Take 1 tablet by mouth daily., Disp: , Rfl:  .  pantoprazole (PROTONIX) 40 MG tablet, Take 1 tablet (40 mg total) by mouth daily., Disp: 30 tablet, Rfl: 1 .  potassium chloride SA (KLOR-CON) 20 MEQ tablet, Take 20 mEq  by mouth daily. , Disp: , Rfl:  .  SYMBICORT 80-4.5 MCG/ACT inhaler, Inhale 2 puffs into the lungs daily as needed. , Disp: , Rfl:  .  tamsulosin (FLOMAX) 0.4 MG CAPS capsule, Take 0.4 mg by mouth daily., Disp: , Rfl:  .  torsemide (DEMADEX) 20 MG tablet, Take 2 tablets by mouth every morning and 1 tablet by mouth every evening , Disp: , Rfl:  .  VENTOLIN HFA 108 (90 Base) MCG/ACT inhaler, Inhale 2 puffs into the lungs every 6 (six) hours as needed for wheezing or shortness of breath. , Disp: , Rfl:  .  Dextromethorphan HBr (ROBITUSSIN LINGERING COUGHGELS) 15 MG CAPS, Take 15 mg by mouth daily as needed (cough)., Disp: , Rfl:  Allergies  Allergen Reactions  . Cashew Nut Oil Palpitations  . Percocet [Oxycodone-Acetaminophen] Other (See Comments)    Hallucintaions      Social History   Socioeconomic History  . Marital status: Married    Spouse name: Not on file  . Number of children: Not on file  . Years of education: Not on file  . Highest education level: Not on file  Occupational History  . Not on file  Tobacco Use  . Smoking status: Never Smoker  . Smokeless tobacco: Never Used  Substance and Sexual Activity  . Alcohol use: No  . Drug use: No  . Sexual activity: Not Currently    Partners: Female    Birth control/protection: None  Other Topics Concern  . Not on file  Social History Narrative   Patient lives in private residence with supportive spouse   Social Determinants of Health   Financial Resource Strain:   . Difficulty of Paying Living Expenses:   Food Insecurity:   . Worried About Charity fundraiser in the Last Year:   . Arboriculturist in the Last Year:   Transportation Needs:   . Film/video editor (Medical):   Marland Kitchen Lack of Transportation (Non-Medical):   Physical Activity:   . Days of Exercise per Week:   . Minutes of Exercise per Session:   Stress:   . Feeling of Stress :   Social Connections:   . Frequency of Communication with Friends and Family:    . Frequency of Social Gatherings with Friends and Family:   . Attends Religious Services:   . Active Member of Clubs or Organizations:   . Attends Archivist Meetings:   Marland Kitchen Marital Status:   Intimate Partner Violence:   . Fear of Current or Ex-Partner:   . Emotionally Abused:   Marland Kitchen Physically Abused:   . Sexually Abused:     Physical Exam      Future Appointments  Date Time Provider [place]  12/03/2019 11:40 AM Larey Dresser, MD [facility] None  12/09/2019 10:15 AM Gardiner Barefoot, DPM [facility]  02/29/2020  1:45 PM Rankin, Clent Demark, MD [facility] None    BP 112/68   Pulse 66   Temp 98.6 F (37 C)  Resp 18   Wt 207 lb (93.9 kg)   SpO2 97%   BMI 32.42 kg/m   Weight yesterday-207 Last visit weight-209  Pt reports doing well. He denies increased sob, no dizziness, no c/p.  He missed one pm dose of his meds on Saturday night. Using CPAP. Weight doing great. Wearing compression stockings daily. No edema noted to his legs. abd tight at times.  Sleeping ok.  meds verified and pill box refilled.    [name], [place] [facility] Paramedic  10/21/19

## 2019-10-28 ENCOUNTER — Other Ambulatory Visit (HOSPITAL_COMMUNITY): Payer: Self-pay

## 2019-10-28 NOTE — Progress Notes (Signed)
Paramedicine Encounter    Patient ID: [name], male    DOB: 03-09-1951, 69 y.o.   MRN: [id number]   Patient Care Team: Clinic, Medical, MD (Inactive) as PCP - General Sherren Mocha, MD as PCP - Cardiology (Cardiology) Larey Dresser, MD as PCP - Advanced Heart Failure (Cardiology)  Patient Active Problem List   Diagnosis Date Noted  . Hallux limitus of left foot 06/05/2019  . Hallux limitus of right foot 06/05/2019  . Seasonal allergies 02/12/2019  . Health care maintenance 02/12/2019  . GERD (gastroesophageal reflux disease) 02/12/2019  . Nocturnal hypoxemia 02/12/2019  . OSA (obstructive sleep apnea) 02/12/2019  . Pain due to onychomycosis of toenails of both feet 01/30/2019  . Lobar pneumonia ([place]) 12/29/2018  . Diabetes mellitus type 2, controlled, without complications ([place]) 37/34/2876  . Thoracic aortic aneurysm ([place]) 12/29/2018  . Acute on chronic combined systolic and diastolic CHF (congestive heart failure) ([place]) 12/25/2018  . Syncope 12/25/2018  . Chronic systolic heart failure ([place]) 11/19/2018  . Chronic venous insufficiency 06/24/2018  . Respiratory failure ([place]) 06/01/2018  . Leg swelling 05/06/2018  . Acute respiratory failure with hypoxemia ([place]) 10/14/2017  . Influenza-like illness 10/14/2017  . Delirium 10/14/2017  . Acute urinary retention 10/14/2017  . (HFpEF) heart failure with preserved ejection fraction ([place]), pulmonary edema and LBBB of unknown chronicity  10/29/2016  . Elevated troponin I level, presume demand ischemia  10/29/2016  . AKI (acute kidney injury) ([place]) 10/29/2016  . BPH (benign prostatic hyperplasia) 10/29/2016  . Sepsis ([place]) 10/29/2016  . Bacteremia due to Escherichia coli 10/29/2016  . Fatty liver 10/29/2016  . Acute pulmonary edema (HCC)   . Acute hypoxic and hypercarbic respiratory failure (HCC) in setting of bilateral pulmonary infiltrates. Presume CAP + pulmonary edema +/-ALI 10/27/2016  . Left-sided weakness 05/04/2015  .  TIA (transient ischemic attack) : Rule out. 05/04/2015  . Hyperlipidemia 05/04/2015  . Chest pain 10/10/2013  . SOB (shortness of breath) 10/10/2013  . Hyperglycemia without ketosis 10/17/2011  . HTN (hypertension) 10/17/2011  . Foot drop, left 10/17/2011  . Impacted cerumen of right ear 10/17/2011  . Dyspnea 10/17/2011  . History of TIA (transient ischemic attack) 10/17/2011  . Neuropathy ([place]) 10/17/2011  . Aortic insufficiency 03/16/2011  . Chest pain 03/16/2011    Current Outpatient Medications:  .  Accu-Chek FastClix Lancets MISC, USE UTD, Disp: , Rfl:  .  ACCU-CHEK GUIDE test strip, U TO TEST BLOOD SUGAR QD, Disp: , Rfl:  .  aspirin EC 81 MG tablet, Take 81 mg by mouth daily., Disp: , Rfl:  .  atorvastatin (LIPITOR) 40 MG tablet, Take 1 tablet (40 mg total) by mouth every evening. (Patient taking differently: Take 40 mg by mouth every evening. ), Disp: 30 tablet, Rfl: 11 .  Blood Glucose Monitoring Suppl (ACCU-CHEK GUIDE) w/Device KIT, U TO TEST BLOOD SUGAR QD, Disp: , Rfl:  .  carvedilol (COREG) 12.5 MG tablet, Take 1 tablet (12.5 mg total) by mouth 2 (two) times daily., Disp: 60 tablet, Rfl: 5 .  famotidine (PEPCID) 20 MG tablet, Take 20 mg by mouth at bedtime., Disp: , Rfl:  .  finasteride (PROSCAR) 5 MG tablet, Take 5 mg by mouth daily., Disp: , Rfl:  .  gabapentin (NEURONTIN) 300 MG capsule, Take 300 mg by mouth 4 (four) times daily. , Disp: , Rfl:  .  hydrOXYzine (VISTARIL) 50 MG capsule, Take 50 mg by mouth at bedtime. , Disp: , Rfl:  .  metFORMIN (GLUCOPHAGE) 1000 MG  tablet, Take 1,000 mg by mouth 2 (two) times a day. , Disp: , Rfl:  .  montelukast (SINGULAIR) 10 MG tablet, Take 10 mg by mouth daily. , Disp: , Rfl:  .  Multiple Vitamin (MULTIVITAMIN) tablet, Take 1 tablet by mouth daily., Disp: , Rfl:  .  pantoprazole (PROTONIX) 40 MG tablet, Take 1 tablet (40 mg total) by mouth daily., Disp: 30 tablet, Rfl: 1 .  potassium chloride SA (KLOR-CON) 20 MEQ tablet, Take 20 mEq  by mouth daily. , Disp: , Rfl:  .  SYMBICORT 80-4.5 MCG/ACT inhaler, Inhale 2 puffs into the lungs daily as needed. , Disp: , Rfl:  .  tamsulosin (FLOMAX) 0.4 MG CAPS capsule, Take 0.4 mg by mouth daily., Disp: , Rfl:  .  torsemide (DEMADEX) 20 MG tablet, Take 2 tablets by mouth every morning and 1 tablet by mouth every evening , Disp: , Rfl:  .  VENTOLIN HFA 108 (90 Base) MCG/ACT inhaler, Inhale 2 puffs into the lungs every 6 (six) hours as needed for wheezing or shortness of breath. , Disp: , Rfl:  .  Dextromethorphan HBr (ROBITUSSIN LINGERING COUGHGELS) 15 MG CAPS, Take 15 mg by mouth daily as needed (cough)., Disp: , Rfl:  Allergies  Allergen Reactions  . Cashew Nut Oil Palpitations  . Percocet [Oxycodone-Acetaminophen] Other (See Comments)    Hallucintaions      Social History   Socioeconomic History  . Marital status: Married    Spouse name: Not on file  . Number of children: Not on file  . Years of education: Not on file  . Highest education level: Not on file  Occupational History  . Not on file  Tobacco Use  . Smoking status: Never Smoker  . Smokeless tobacco: Never Used  Substance and Sexual Activity  . Alcohol use: No  . Drug use: No  . Sexual activity: Not Currently    Partners: Female    Birth control/protection: None  Other Topics Concern  . Not on file  Social History Narrative   Patient lives in private residence with supportive spouse   Social Determinants of Health   Financial Resource Strain:   . Difficulty of Paying Living Expenses:   Food Insecurity:   . Worried About Charity fundraiser in the Last Year:   . Arboriculturist in the Last Year:   Transportation Needs:   . Film/video editor (Medical):   Marland Kitchen Lack of Transportation (Non-Medical):   Physical Activity:   . Days of Exercise per Week:   . Minutes of Exercise per Session:   Stress:   . Feeling of Stress :   Social Connections:   . Frequency of Communication with Friends and Family:    . Frequency of Social Gatherings with Friends and Family:   . Attends Religious Services:   . Active Member of Clubs or Organizations:   . Attends Archivist Meetings:   Marland Kitchen Marital Status:   Intimate Partner Violence:   . Fear of Current or Ex-Partner:   . Emotionally Abused:   Marland Kitchen Physically Abused:   . Sexually Abused:     Physical Exam      Future Appointments  Date Time Provider [place]  12/03/2019 11:40 AM Larey Dresser, MD [facility] None  12/09/2019 10:15 AM Gardiner Barefoot, DPM [facility]  02/29/2020  1:45 PM Rankin, Clent Demark, MD [facility] None    BP 126/80   Pulse 74   Temp 98.3 F (36.8 C)  Resp 18   Wt 208 lb (94.3 kg)   SpO2 97%   BMI 32.58 kg/m  CBG EMS-136 Weight yesterday-208 Last visit weight-207  Pt reports doing ok, he did have a fall yesterday where he tripped on a piece of rug near the door, he fell up against the door frame and slid down to the floor. No striking of his head. No loc. He was able to get up on his own using a chair to assist. No sob. No edema.  He is feeling very sore to the left side of ribs, increased pain with movement-no outward bruising. He is using tylenol for pain control. Also advised him he could use cold/heat pack as well.   meds verified and pill box refilled.  Ordered atorvastatin and potassium for refill. Pt reports he has been very thirsty lately-CBG as noted-wife reports he has eaten chinese food and hotdogs. So we talked about that and better food choices.    [name], [place] [facility] Paramedic  10/28/19

## 2019-11-02 ENCOUNTER — Ambulatory Visit (HOSPITAL_COMMUNITY)
Admission: RE | Admit: 2019-11-02 | Discharge: 2019-11-02 | Disposition: A | Discharge status: Home / Self Care | Payer: Medicare Other | Source: Ambulatory Visit | Attending: Internal Medicine | Admitting: Internal Medicine

## 2019-11-02 DIAGNOSIS — I5022 Chronic systolic (congestive) heart failure: Secondary | ICD-10-CM

## 2019-11-02 NOTE — Progress Notes (Addendum)
    Cardiomems Update   Implant Date 05/26/20  RHC RA 2 PA 33/8 (22) PCWP 11 CO 6.8 CI 3.3    PAD Range 20  Current PAD 18   Current Labs  09/01/2019  Creatinine 1.75 BUN 39  Potassium 3.9     Recommendations  I have reviewed the patients PA monitoring at least weekly to bring PA pressures within optimal range.   Franki Stemen NP-C 4:28 PM

## 2019-11-04 ENCOUNTER — Other Ambulatory Visit (HOSPITAL_COMMUNITY): Payer: Self-pay

## 2019-11-04 NOTE — Progress Notes (Signed)
Paramedicine Encounter    Patient ID: [name], male    DOB: 03-09-1951, 69 y.o.   MRN: [id number]   Patient Care Team: Clinic, Medical, MD (Inactive) as PCP - General Sherren Mocha, MD as PCP - Cardiology (Cardiology) Larey Dresser, MD as PCP - Advanced Heart Failure (Cardiology)  Patient Active Problem List   Diagnosis Date Noted  . Hallux limitus of left foot 06/05/2019  . Hallux limitus of right foot 06/05/2019  . Seasonal allergies 02/12/2019  . Health care maintenance 02/12/2019  . GERD (gastroesophageal reflux disease) 02/12/2019  . Nocturnal hypoxemia 02/12/2019  . OSA (obstructive sleep apnea) 02/12/2019  . Pain due to onychomycosis of toenails of both feet 01/30/2019  . Lobar pneumonia ([place]) 12/29/2018  . Diabetes mellitus type 2, controlled, without complications ([place]) 37/34/2876  . Thoracic aortic aneurysm ([place]) 12/29/2018  . Acute on chronic combined systolic and diastolic CHF (congestive heart failure) ([place]) 12/25/2018  . Syncope 12/25/2018  . Chronic systolic heart failure ([place]) 11/19/2018  . Chronic venous insufficiency 06/24/2018  . Respiratory failure ([place]) 06/01/2018  . Leg swelling 05/06/2018  . Acute respiratory failure with hypoxemia ([place]) 10/14/2017  . Influenza-like illness 10/14/2017  . Delirium 10/14/2017  . Acute urinary retention 10/14/2017  . (HFpEF) heart failure with preserved ejection fraction ([place]), pulmonary edema and LBBB of unknown chronicity  10/29/2016  . Elevated troponin I level, presume demand ischemia  10/29/2016  . AKI (acute kidney injury) ([place]) 10/29/2016  . BPH (benign prostatic hyperplasia) 10/29/2016  . Sepsis ([place]) 10/29/2016  . Bacteremia due to Escherichia coli 10/29/2016  . Fatty liver 10/29/2016  . Acute pulmonary edema (HCC)   . Acute hypoxic and hypercarbic respiratory failure (HCC) in setting of bilateral pulmonary infiltrates. Presume CAP + pulmonary edema +/-ALI 10/27/2016  . Left-sided weakness 05/04/2015  .  TIA (transient ischemic attack) : Rule out. 05/04/2015  . Hyperlipidemia 05/04/2015  . Chest pain 10/10/2013  . SOB (shortness of breath) 10/10/2013  . Hyperglycemia without ketosis 10/17/2011  . HTN (hypertension) 10/17/2011  . Foot drop, left 10/17/2011  . Impacted cerumen of right ear 10/17/2011  . Dyspnea 10/17/2011  . History of TIA (transient ischemic attack) 10/17/2011  . Neuropathy ([place]) 10/17/2011  . Aortic insufficiency 03/16/2011  . Chest pain 03/16/2011    Current Outpatient Medications:  .  Accu-Chek FastClix Lancets MISC, USE UTD, Disp: , Rfl:  .  ACCU-CHEK GUIDE test strip, U TO TEST BLOOD SUGAR QD, Disp: , Rfl:  .  aspirin EC 81 MG tablet, Take 81 mg by mouth daily., Disp: , Rfl:  .  atorvastatin (LIPITOR) 40 MG tablet, Take 1 tablet (40 mg total) by mouth every evening. (Patient taking differently: Take 40 mg by mouth every evening. ), Disp: 30 tablet, Rfl: 11 .  Blood Glucose Monitoring Suppl (ACCU-CHEK GUIDE) w/Device KIT, U TO TEST BLOOD SUGAR QD, Disp: , Rfl:  .  carvedilol (COREG) 12.5 MG tablet, Take 1 tablet (12.5 mg total) by mouth 2 (two) times daily., Disp: 60 tablet, Rfl: 5 .  famotidine (PEPCID) 20 MG tablet, Take 20 mg by mouth at bedtime., Disp: , Rfl:  .  finasteride (PROSCAR) 5 MG tablet, Take 5 mg by mouth daily., Disp: , Rfl:  .  gabapentin (NEURONTIN) 300 MG capsule, Take 300 mg by mouth 4 (four) times daily. , Disp: , Rfl:  .  hydrOXYzine (VISTARIL) 50 MG capsule, Take 50 mg by mouth at bedtime. , Disp: , Rfl:  .  metFORMIN (GLUCOPHAGE) 1000 MG  tablet, Take 1,000 mg by mouth 2 (two) times a day. , Disp: , Rfl:  .  montelukast (SINGULAIR) 10 MG tablet, Take 10 mg by mouth daily. , Disp: , Rfl:  .  Multiple Vitamin (MULTIVITAMIN) tablet, Take 1 tablet by mouth daily., Disp: , Rfl:  .  pantoprazole (PROTONIX) 40 MG tablet, Take 1 tablet (40 mg total) by mouth daily., Disp: 30 tablet, Rfl: 1 .  potassium chloride SA (KLOR-CON) 20 MEQ tablet, Take 20 mEq  by mouth daily. , Disp: , Rfl:  .  SYMBICORT 80-4.5 MCG/ACT inhaler, Inhale 2 puffs into the lungs daily as needed. , Disp: , Rfl:  .  tamsulosin (FLOMAX) 0.4 MG CAPS capsule, Take 0.4 mg by mouth daily., Disp: , Rfl:  .  torsemide (DEMADEX) 20 MG tablet, Take 2 tablets by mouth every morning and 1 tablet by mouth every evening , Disp: , Rfl:  .  VENTOLIN HFA 108 (90 Base) MCG/ACT inhaler, Inhale 2 puffs into the lungs every 6 (six) hours as needed for wheezing or shortness of breath. , Disp: , Rfl:  .  Dextromethorphan HBr (ROBITUSSIN LINGERING COUGHGELS) 15 MG CAPS, Take 15 mg by mouth daily as needed (cough)., Disp: , Rfl:  Allergies  Allergen Reactions  . Cashew Nut Oil Palpitations  . Percocet [Oxycodone-Acetaminophen] Other (See Comments)    Hallucintaions      Social History   Socioeconomic History  . Marital status: Married    Spouse name: Not on file  . Number of children: Not on file  . Years of education: Not on file  . Highest education level: Not on file  Occupational History  . Not on file  Tobacco Use  . Smoking status: Never Smoker  . Smokeless tobacco: Never Used  Substance and Sexual Activity  . Alcohol use: No  . Drug use: No  . Sexual activity: Not Currently    Partners: Female    Birth control/protection: None  Other Topics Concern  . Not on file  Social History Narrative   Patient lives in private residence with supportive spouse   Social Determinants of Health   Financial Resource Strain:   . Difficulty of Paying Living Expenses:   Food Insecurity:   . Worried About Charity fundraiser in the Last Year:   . Arboriculturist in the Last Year:   Transportation Needs:   . Film/video editor (Medical):   Marland Kitchen Lack of Transportation (Non-Medical):   Physical Activity:   . Days of Exercise per Week:   . Minutes of Exercise per Session:   Stress:   . Feeling of Stress :   Social Connections:   . Frequency of Communication with Friends and Family:    . Frequency of Social Gatherings with Friends and Family:   . Attends Religious Services:   . Active Member of Clubs or Organizations:   . Attends Archivist Meetings:   Marland Kitchen Marital Status:   Intimate Partner Violence:   . Fear of Current or Ex-Partner:   . Emotionally Abused:   Marland Kitchen Physically Abused:   . Sexually Abused:     Physical Exam      Future Appointments  Date Time Provider [place]  12/03/2019 11:40 AM Larey Dresser, MD [facility] None  12/09/2019 10:15 AM Gardiner Barefoot, DPM [facility]  02/29/2020  1:45 PM Rankin, Clent Demark, MD [facility] None    BP 122/70   Pulse 78   Temp 97.6 F (36.4 C)  Resp 16   Wt 208 lb (94.3 kg)   SpO2 95%   BMI 32.58 kg/m   Weight yesterday-207 Last visit weight-208  Pt states he ate hotdog for breakfast this morning and felt bad after that. He had a lot of indigestion and stomach pains. I have told him many times to stay away from hotdogs.  He does feel better now though.  He denies increased sob, no c/p, no dizziness.  No missed doses of his meds. meds verified. Pill box refilled. No edema noted. He wears compression stockings daily. Lungs clear.    [name], [place] [facility] Paramedic  11/04/19

## 2019-11-10 ENCOUNTER — Telehealth (HOSPITAL_COMMUNITY): Payer: Self-pay | Admitting: Adult Health

## 2019-11-10 NOTE — Telephone Encounter (Signed)
Cardiomems elevated to 26 mm hg   Called and his weight has gone up from 207-210 pounds   Instructed to take an extra 20 mg of torsemide for 2 days.   Keina Mutch NP-C  4:15 PM

## 2019-11-11 ENCOUNTER — Other Ambulatory Visit (HOSPITAL_COMMUNITY): Payer: Self-pay

## 2019-11-11 NOTE — Progress Notes (Signed)
Paramedicine Encounter    Patient ID: [name], male    DOB: January 26, 1951, 69 y.o.   MRN: [id number]   Patient Care Team: Clinic, Medical, MD (Inactive) as PCP - General Sherren Mocha, MD as PCP - Cardiology (Cardiology) Larey Dresser, MD as PCP - Advanced Heart Failure (Cardiology)  Patient Active Problem List   Diagnosis Date Noted  . Hallux limitus of left foot 06/05/2019  . Hallux limitus of right foot 06/05/2019  . Seasonal allergies 02/12/2019  . Health care maintenance 02/12/2019  . GERD (gastroesophageal reflux disease) 02/12/2019  . Nocturnal hypoxemia 02/12/2019  . OSA (obstructive sleep apnea) 02/12/2019  . Pain due to onychomycosis of toenails of both feet 01/30/2019  . Lobar pneumonia ([place]) 12/29/2018  . Diabetes mellitus type 2, controlled, without complications ([place]) 28/76/8115  . Thoracic aortic aneurysm ([place]) 12/29/2018  . Acute on chronic combined systolic and diastolic CHF (congestive heart failure) ([place]) 12/25/2018  . Syncope 12/25/2018  . Chronic systolic heart failure ([place]) 11/19/2018  . Chronic venous insufficiency 06/24/2018  . Respiratory failure ([place]) 06/01/2018  . Leg swelling 05/06/2018  . Acute respiratory failure with hypoxemia ([place]) 10/14/2017  . Influenza-like illness 10/14/2017  . Delirium 10/14/2017  . Acute urinary retention 10/14/2017  . (HFpEF) heart failure with preserved ejection fraction ([place]), pulmonary edema and LBBB of unknown chronicity  10/29/2016  . Elevated troponin I level, presume demand ischemia  10/29/2016  . AKI (acute kidney injury) ([place]) 10/29/2016  . BPH (benign prostatic hyperplasia) 10/29/2016  . Sepsis ([place]) 10/29/2016  . Bacteremia due to Escherichia coli 10/29/2016  . Fatty liver 10/29/2016  . Acute pulmonary edema (HCC)   . Acute hypoxic and hypercarbic respiratory failure (HCC) in setting of bilateral pulmonary infiltrates. Presume CAP + pulmonary edema +/-ALI 10/27/2016  . Left-sided weakness 05/04/2015  .  TIA (transient ischemic attack) : Rule out. 05/04/2015  . Hyperlipidemia 05/04/2015  . Chest pain 10/10/2013  . SOB (shortness of breath) 10/10/2013  . Hyperglycemia without ketosis 10/17/2011  . HTN (hypertension) 10/17/2011  . Foot drop, left 10/17/2011  . Impacted cerumen of right ear 10/17/2011  . Dyspnea 10/17/2011  . History of TIA (transient ischemic attack) 10/17/2011  . Neuropathy ([place]) 10/17/2011  . Aortic insufficiency 03/16/2011  . Chest pain 03/16/2011    Current Outpatient Medications:  .  Accu-Chek FastClix Lancets MISC, USE UTD, Disp: , Rfl:  .  ACCU-CHEK GUIDE test strip, U TO TEST BLOOD SUGAR QD, Disp: , Rfl:  .  aspirin EC 81 MG tablet, Take 81 mg by mouth daily., Disp: , Rfl:  .  atorvastatin (LIPITOR) 40 MG tablet, Take 1 tablet (40 mg total) by mouth every evening. (Patient taking differently: Take 40 mg by mouth every evening. ), Disp: 30 tablet, Rfl: 11 .  Blood Glucose Monitoring Suppl (ACCU-CHEK GUIDE) w/Device KIT, U TO TEST BLOOD SUGAR QD, Disp: , Rfl:  .  carvedilol (COREG) 12.5 MG tablet, Take 1 tablet (12.5 mg total) by mouth 2 (two) times daily., Disp: 60 tablet, Rfl: 5 .  famotidine (PEPCID) 20 MG tablet, Take 20 mg by mouth at bedtime., Disp: , Rfl:  .  finasteride (PROSCAR) 5 MG tablet, Take 5 mg by mouth daily., Disp: , Rfl:  .  gabapentin (NEURONTIN) 300 MG capsule, Take 300 mg by mouth 4 (four) times daily. , Disp: , Rfl:  .  hydrOXYzine (VISTARIL) 50 MG capsule, Take 50 mg by mouth at bedtime. , Disp: , Rfl:  .  metFORMIN (GLUCOPHAGE) 1000 MG  tablet, Take 1,000 mg by mouth 2 (two) times a day. , Disp: , Rfl:  .  montelukast (SINGULAIR) 10 MG tablet, Take 10 mg by mouth daily. , Disp: , Rfl:  .  Multiple Vitamin (MULTIVITAMIN) tablet, Take 1 tablet by mouth daily., Disp: , Rfl:  .  pantoprazole (PROTONIX) 40 MG tablet, Take 1 tablet (40 mg total) by mouth daily., Disp: 30 tablet, Rfl: 1 .  potassium chloride SA (KLOR-CON) 20 MEQ tablet, Take 20 mEq  by mouth daily. , Disp: , Rfl:  .  SYMBICORT 80-4.5 MCG/ACT inhaler, Inhale 2 puffs into the lungs daily as needed. , Disp: , Rfl:  .  tamsulosin (FLOMAX) 0.4 MG CAPS capsule, Take 0.4 mg by mouth daily., Disp: , Rfl:  .  torsemide (DEMADEX) 20 MG tablet, Take 2 tablets by mouth every morning and 1 tablet by mouth every evening , Disp: , Rfl:  .  VENTOLIN HFA 108 (90 Base) MCG/ACT inhaler, Inhale 2 puffs into the lungs every 6 (six) hours as needed for wheezing or shortness of breath. , Disp: , Rfl:  .  Dextromethorphan HBr (ROBITUSSIN LINGERING COUGHGELS) 15 MG CAPS, Take 15 mg by mouth daily as needed (cough)., Disp: , Rfl:  Allergies  Allergen Reactions  . Cashew Nut Oil Palpitations  . Percocet [Oxycodone-Acetaminophen] Other (See Comments)    Hallucintaions      Social History   Socioeconomic History  . Marital status: Married    Spouse name: Not on file  . Number of children: Not on file  . Years of education: Not on file  . Highest education level: Not on file  Occupational History  . Not on file  Tobacco Use  . Smoking status: Never Smoker  . Smokeless tobacco: Never Used  Substance and Sexual Activity  . Alcohol use: No  . Drug use: No  . Sexual activity: Not Currently    Partners: Female    Birth control/protection: None  Other Topics Concern  . Not on file  Social History Narrative   Patient lives in private residence with supportive spouse   Social Determinants of Health   Financial Resource Strain:   . Difficulty of Paying Living Expenses:   Food Insecurity:   . Worried About Charity fundraiser in the Last Year:   . Arboriculturist in the Last Year:   Transportation Needs:   . Film/video editor (Medical):   Marland Kitchen Lack of Transportation (Non-Medical):   Physical Activity:   . Days of Exercise per Week:   . Minutes of Exercise per Session:   Stress:   . Feeling of Stress :   Social Connections:   . Frequency of Communication with Friends and Family:    . Frequency of Social Gatherings with Friends and Family:   . Attends Religious Services:   . Active Member of Clubs or Organizations:   . Attends Archivist Meetings:   Marland Kitchen Marital Status:   Intimate Partner Violence:   . Fear of Current or Ex-Partner:   . Emotionally Abused:   Marland Kitchen Physically Abused:   . Sexually Abused:     Physical Exam      Future Appointments  Date Time Provider [place]  12/03/2019 11:40 AM Larey Dresser, MD [facility] None  12/09/2019 10:15 AM Gardiner Barefoot, DPM [facility]  02/29/2020  1:45 PM Rankin, Clent Demark, MD [facility] None    BP 104/80   Pulse 68   Temp 98.7 F (37.1 C)  Resp 18   Wt 209 lb (94.8 kg)   SpO2 96%   BMI 32.73 kg/m   Weight yesterday-210 Last visit weight-208  When I arrived pt had fallen back onto the couch-he had tripped over and fell in a sitting position to the couch. No injuries. He does have rib soreness from the fall into the door frame last week.  His weight went up over the wknd and it was due to poor food choices and eating late at night.  Per cardiomems he is to take an additional '20mg'$  of torsemide yesterday and today. He did that yesterday and he will take the extra this afternoon dose.  No missed doses of his meds.  meds verified and pill box refilled.  --ordered metformin and gabapentin. The gabapentin needs refills sent in by his doctor.   He has had increased phlegm, has been taking mucinex to help with that.  He denies c/p. No increased sob. No dizziness.    [name], [facility] Paramedic  11/11/19

## 2019-11-18 ENCOUNTER — Other Ambulatory Visit (HOSPITAL_COMMUNITY): Payer: Self-pay

## 2019-11-18 NOTE — Progress Notes (Signed)
Paramedicine Encounter    Patient ID: [name], male    DOB: January 26, 1951, 69 y.o.   MRN: [id number]   Patient Care Team: Clinic, Medical, MD (Inactive) as PCP - General Sherren Mocha, MD as PCP - Cardiology (Cardiology) Larey Dresser, MD as PCP - Advanced Heart Failure (Cardiology)  Patient Active Problem List   Diagnosis Date Noted  . Hallux limitus of left foot 06/05/2019  . Hallux limitus of right foot 06/05/2019  . Seasonal allergies 02/12/2019  . Health care maintenance 02/12/2019  . GERD (gastroesophageal reflux disease) 02/12/2019  . Nocturnal hypoxemia 02/12/2019  . OSA (obstructive sleep apnea) 02/12/2019  . Pain due to onychomycosis of toenails of both feet 01/30/2019  . Lobar pneumonia ([place]) 12/29/2018  . Diabetes mellitus type 2, controlled, without complications ([place]) 28/76/8115  . Thoracic aortic aneurysm ([place]) 12/29/2018  . Acute on chronic combined systolic and diastolic CHF (congestive heart failure) ([place]) 12/25/2018  . Syncope 12/25/2018  . Chronic systolic heart failure ([place]) 11/19/2018  . Chronic venous insufficiency 06/24/2018  . Respiratory failure ([place]) 06/01/2018  . Leg swelling 05/06/2018  . Acute respiratory failure with hypoxemia ([place]) 10/14/2017  . Influenza-like illness 10/14/2017  . Delirium 10/14/2017  . Acute urinary retention 10/14/2017  . (HFpEF) heart failure with preserved ejection fraction ([place]), pulmonary edema and LBBB of unknown chronicity  10/29/2016  . Elevated troponin I level, presume demand ischemia  10/29/2016  . AKI (acute kidney injury) ([place]) 10/29/2016  . BPH (benign prostatic hyperplasia) 10/29/2016  . Sepsis ([place]) 10/29/2016  . Bacteremia due to Escherichia coli 10/29/2016  . Fatty liver 10/29/2016  . Acute pulmonary edema (HCC)   . Acute hypoxic and hypercarbic respiratory failure (HCC) in setting of bilateral pulmonary infiltrates. Presume CAP + pulmonary edema +/-ALI 10/27/2016  . Left-sided weakness 05/04/2015  .  TIA (transient ischemic attack) : Rule out. 05/04/2015  . Hyperlipidemia 05/04/2015  . Chest pain 10/10/2013  . SOB (shortness of breath) 10/10/2013  . Hyperglycemia without ketosis 10/17/2011  . HTN (hypertension) 10/17/2011  . Foot drop, left 10/17/2011  . Impacted cerumen of right ear 10/17/2011  . Dyspnea 10/17/2011  . History of TIA (transient ischemic attack) 10/17/2011  . Neuropathy ([place]) 10/17/2011  . Aortic insufficiency 03/16/2011  . Chest pain 03/16/2011    Current Outpatient Medications:  .  Accu-Chek FastClix Lancets MISC, USE UTD, Disp: , Rfl:  .  ACCU-CHEK GUIDE test strip, U TO TEST BLOOD SUGAR QD, Disp: , Rfl:  .  aspirin EC 81 MG tablet, Take 81 mg by mouth daily., Disp: , Rfl:  .  atorvastatin (LIPITOR) 40 MG tablet, Take 1 tablet (40 mg total) by mouth every evening. (Patient taking differently: Take 40 mg by mouth every evening. ), Disp: 30 tablet, Rfl: 11 .  Blood Glucose Monitoring Suppl (ACCU-CHEK GUIDE) w/Device KIT, U TO TEST BLOOD SUGAR QD, Disp: , Rfl:  .  carvedilol (COREG) 12.5 MG tablet, Take 1 tablet (12.5 mg total) by mouth 2 (two) times daily., Disp: 60 tablet, Rfl: 5 .  famotidine (PEPCID) 20 MG tablet, Take 20 mg by mouth at bedtime., Disp: , Rfl:  .  finasteride (PROSCAR) 5 MG tablet, Take 5 mg by mouth daily., Disp: , Rfl:  .  gabapentin (NEURONTIN) 300 MG capsule, Take 300 mg by mouth 4 (four) times daily. , Disp: , Rfl:  .  hydrOXYzine (VISTARIL) 50 MG capsule, Take 50 mg by mouth at bedtime. , Disp: , Rfl:  .  metFORMIN (GLUCOPHAGE) 1000 MG  tablet, Take 1,000 mg by mouth 2 (two) times a day. , Disp: , Rfl:  .  montelukast (SINGULAIR) 10 MG tablet, Take 10 mg by mouth daily. , Disp: , Rfl:  .  Multiple Vitamin (MULTIVITAMIN) tablet, Take 1 tablet by mouth daily., Disp: , Rfl:  .  pantoprazole (PROTONIX) 40 MG tablet, Take 1 tablet (40 mg total) by mouth daily., Disp: 30 tablet, Rfl: 1 .  potassium chloride SA (KLOR-CON) 20 MEQ tablet, Take 20 mEq  by mouth daily. , Disp: , Rfl:  .  SYMBICORT 80-4.5 MCG/ACT inhaler, Inhale 2 puffs into the lungs daily as needed. , Disp: , Rfl:  .  tamsulosin (FLOMAX) 0.4 MG CAPS capsule, Take 0.4 mg by mouth daily., Disp: , Rfl:  .  torsemide (DEMADEX) 20 MG tablet, Take 2 tablets by mouth every morning and 1 tablet by mouth every evening , Disp: , Rfl:  .  VENTOLIN HFA 108 (90 Base) MCG/ACT inhaler, Inhale 2 puffs into the lungs every 6 (six) hours as needed for wheezing or shortness of breath. , Disp: , Rfl:  .  Dextromethorphan HBr (ROBITUSSIN LINGERING COUGHGELS) 15 MG CAPS, Take 15 mg by mouth daily as needed (cough)., Disp: , Rfl:  Allergies  Allergen Reactions  . Cashew Nut Oil Palpitations  . Percocet [Oxycodone-Acetaminophen] Other (See Comments)    Hallucintaions      Social History   Socioeconomic History  . Marital status: Married    Spouse name: Not on file  . Number of children: Not on file  . Years of education: Not on file  . Highest education level: Not on file  Occupational History  . Not on file  Tobacco Use  . Smoking status: Never Smoker  . Smokeless tobacco: Never Used  Substance and Sexual Activity  . Alcohol use: No  . Drug use: No  . Sexual activity: Not Currently    Partners: Female    Birth control/protection: None  Other Topics Concern  . Not on file  Social History Narrative   Patient lives in private residence with supportive spouse   Social Determinants of Health   Financial Resource Strain:   . Difficulty of Paying Living Expenses:   Food Insecurity:   . Worried About Charity fundraiser in the Last Year:   . Arboriculturist in the Last Year:   Transportation Needs:   . Film/video editor (Medical):   Marland Kitchen Lack of Transportation (Non-Medical):   Physical Activity:   . Days of Exercise per Week:   . Minutes of Exercise per Session:   Stress:   . Feeling of Stress :   Social Connections:   . Frequency of Communication with Friends and Family:    . Frequency of Social Gatherings with Friends and Family:   . Attends Religious Services:   . Active Member of Clubs or Organizations:   . Attends Archivist Meetings:   Marland Kitchen Marital Status:   Intimate Partner Violence:   . Fear of Current or Ex-Partner:   . Emotionally Abused:   Marland Kitchen Physically Abused:   . Sexually Abused:     Physical Exam      Future Appointments  Date Time Provider [place]  12/03/2019 11:40 AM Larey Dresser, MD [facility] None  12/09/2019 10:15 AM Gardiner Barefoot, DPM [facility]  02/29/2020  1:45 PM Rankin, Clent Demark, MD [facility] None    BP 112/70   Pulse 74   Temp 98.7 F (37.1 C)  Resp 18   Wt 207 lb (93.9 kg)   SpO2 97%   BMI 32.42 kg/m   Weight yesterday-207 Last visit weight-209  Pt reports that he has been doing good, he denies any sob, no c/p, no dizziness. He did mention when he ate some soup, mints or caramel that when he ate it, he felt like he had tickle down in his chest from his throat. He denies having trouble swallowing. No choking sensation. He denies any issues like that with other foods or drinks so it must be something in the soup that was too strong for him as well as the peppermint flavoring.  No missed doses of his meds.   meds verified and pill box refilled.  --ordered tamsulosin and torsemide for refill  He is still having some rib soreness from the fall a few wks ago. No more falls since then.   [name], [place] [facility] Paramedic  11/18/19

## 2019-11-25 ENCOUNTER — Other Ambulatory Visit (HOSPITAL_COMMUNITY): Payer: Self-pay

## 2019-11-25 ENCOUNTER — Telehealth (HOSPITAL_COMMUNITY): Payer: Self-pay | Admitting: Licensed Clinical Social Worker

## 2019-11-25 ENCOUNTER — Telehealth (HOSPITAL_COMMUNITY): Payer: Self-pay | Admitting: Adult Health

## 2019-11-25 NOTE — Telephone Encounter (Signed)
Cardiomems Reading has been elevated for the last several days.   Cardiomems Reading 24 mm Hg  Increase torsemide to 40 mg twice a day.   Discussed with him and his wife + Katie HF Paramedic  He has follow up next week. Plan to check BMET next week.   Kedron Uno NP-C  11:34 AM

## 2019-11-25 NOTE — Telephone Encounter (Signed)
CSW informed by [employer] that pt struggling with his diet at home and is contributing to increased fluid levels.  Paramedic inquiring if pt could receive Moms Meals for 4 weeks to help with pt diet.  Patient identified as a candidate to receive 7 heart healthy meals per week for 4 weeks through [facility].  Completed referral sent in for review.  Anticipate patient will receive first shipment of food in 1-3 business days.  Burna Sis, LCSW Clinical Social Worker [facility] Desk#: [phone] Cell#: [phone]

## 2019-11-25 NOTE — Progress Notes (Signed)
Paramedicine Encounter    Patient ID: [name], male    DOB: January 26, 1951, 69 y.o.   MRN: [id number]   Patient Care Team: Clinic, Medical, MD (Inactive) as PCP - General Sherren Mocha, MD as PCP - Cardiology (Cardiology) Larey Dresser, MD as PCP - Advanced Heart Failure (Cardiology)  Patient Active Problem List   Diagnosis Date Noted  . Hallux limitus of left foot 06/05/2019  . Hallux limitus of right foot 06/05/2019  . Seasonal allergies 02/12/2019  . Health care maintenance 02/12/2019  . GERD (gastroesophageal reflux disease) 02/12/2019  . Nocturnal hypoxemia 02/12/2019  . OSA (obstructive sleep apnea) 02/12/2019  . Pain due to onychomycosis of toenails of both feet 01/30/2019  . Lobar pneumonia ([place]) 12/29/2018  . Diabetes mellitus type 2, controlled, without complications ([place]) 28/76/8115  . Thoracic aortic aneurysm ([place]) 12/29/2018  . Acute on chronic combined systolic and diastolic CHF (congestive heart failure) ([place]) 12/25/2018  . Syncope 12/25/2018  . Chronic systolic heart failure ([place]) 11/19/2018  . Chronic venous insufficiency 06/24/2018  . Respiratory failure ([place]) 06/01/2018  . Leg swelling 05/06/2018  . Acute respiratory failure with hypoxemia ([place]) 10/14/2017  . Influenza-like illness 10/14/2017  . Delirium 10/14/2017  . Acute urinary retention 10/14/2017  . (HFpEF) heart failure with preserved ejection fraction ([place]), pulmonary edema and LBBB of unknown chronicity  10/29/2016  . Elevated troponin I level, presume demand ischemia  10/29/2016  . AKI (acute kidney injury) ([place]) 10/29/2016  . BPH (benign prostatic hyperplasia) 10/29/2016  . Sepsis ([place]) 10/29/2016  . Bacteremia due to Escherichia coli 10/29/2016  . Fatty liver 10/29/2016  . Acute pulmonary edema (HCC)   . Acute hypoxic and hypercarbic respiratory failure (HCC) in setting of bilateral pulmonary infiltrates. Presume CAP + pulmonary edema +/-ALI 10/27/2016  . Left-sided weakness 05/04/2015  .  TIA (transient ischemic attack) : Rule out. 05/04/2015  . Hyperlipidemia 05/04/2015  . Chest pain 10/10/2013  . SOB (shortness of breath) 10/10/2013  . Hyperglycemia without ketosis 10/17/2011  . HTN (hypertension) 10/17/2011  . Foot drop, left 10/17/2011  . Impacted cerumen of right ear 10/17/2011  . Dyspnea 10/17/2011  . History of TIA (transient ischemic attack) 10/17/2011  . Neuropathy ([place]) 10/17/2011  . Aortic insufficiency 03/16/2011  . Chest pain 03/16/2011    Current Outpatient Medications:  .  Accu-Chek FastClix Lancets MISC, USE UTD, Disp: , Rfl:  .  ACCU-CHEK GUIDE test strip, U TO TEST BLOOD SUGAR QD, Disp: , Rfl:  .  aspirin EC 81 MG tablet, Take 81 mg by mouth daily., Disp: , Rfl:  .  atorvastatin (LIPITOR) 40 MG tablet, Take 1 tablet (40 mg total) by mouth every evening. (Patient taking differently: Take 40 mg by mouth every evening. ), Disp: 30 tablet, Rfl: 11 .  Blood Glucose Monitoring Suppl (ACCU-CHEK GUIDE) w/Device KIT, U TO TEST BLOOD SUGAR QD, Disp: , Rfl:  .  carvedilol (COREG) 12.5 MG tablet, Take 1 tablet (12.5 mg total) by mouth 2 (two) times daily., Disp: 60 tablet, Rfl: 5 .  famotidine (PEPCID) 20 MG tablet, Take 20 mg by mouth at bedtime., Disp: , Rfl:  .  finasteride (PROSCAR) 5 MG tablet, Take 5 mg by mouth daily., Disp: , Rfl:  .  gabapentin (NEURONTIN) 300 MG capsule, Take 300 mg by mouth 4 (four) times daily. , Disp: , Rfl:  .  hydrOXYzine (VISTARIL) 50 MG capsule, Take 50 mg by mouth at bedtime. , Disp: , Rfl:  .  metFORMIN (GLUCOPHAGE) 1000 MG  tablet, Take 1,000 mg by mouth 2 (two) times a day. , Disp: , Rfl:  .  montelukast (SINGULAIR) 10 MG tablet, Take 10 mg by mouth daily. , Disp: , Rfl:  .  Multiple Vitamin (MULTIVITAMIN) tablet, Take 1 tablet by mouth daily., Disp: , Rfl:  .  pantoprazole (PROTONIX) 40 MG tablet, Take 1 tablet (40 mg total) by mouth daily., Disp: 30 tablet, Rfl: 1 .  potassium chloride SA (KLOR-CON) 20 MEQ tablet, Take 20 mEq  by mouth daily. , Disp: , Rfl:  .  SYMBICORT 80-4.5 MCG/ACT inhaler, Inhale 2 puffs into the lungs daily as needed. , Disp: , Rfl:  .  tamsulosin (FLOMAX) 0.4 MG CAPS capsule, Take 0.4 mg by mouth daily., Disp: , Rfl:  .  torsemide (DEMADEX) 20 MG tablet, Take 40 mg by mouth 2 (two) times daily. Take 2 tablets by mouth every morning and 1 tablet by mouth every evening , Disp: , Rfl:  .  VENTOLIN HFA 108 (90 Base) MCG/ACT inhaler, Inhale 2 puffs into the lungs every 6 (six) hours as needed for wheezing or shortness of breath. , Disp: , Rfl:  .  Dextromethorphan HBr (ROBITUSSIN LINGERING COUGHGELS) 15 MG CAPS, Take 15 mg by mouth daily as needed (cough)., Disp: , Rfl:  Allergies  Allergen Reactions  . Cashew Nut Oil Palpitations  . Percocet [Oxycodone-Acetaminophen] Other (See Comments)    Hallucintaions      Social History   Socioeconomic History  . Marital status: Married    Spouse name: Not on file  . Number of children: Not on file  . Years of education: Not on file  . Highest education level: Not on file  Occupational History  . Not on file  Tobacco Use  . Smoking status: Never Smoker  . Smokeless tobacco: Never Used  Substance and Sexual Activity  . Alcohol use: No  . Drug use: No  . Sexual activity: Not Currently    Partners: Female    Birth control/protection: None  Other Topics Concern  . Not on file  Social History Narrative   Patient lives in private residence with supportive spouse   Social Determinants of Health   Financial Resource Strain:   . Difficulty of Paying Living Expenses:   Food Insecurity:   . Worried About Charity fundraiser in the Last Year:   . Arboriculturist in the Last Year:   Transportation Needs:   . Film/video editor (Medical):   Marland Kitchen Lack of Transportation (Non-Medical):   Physical Activity:   . Days of Exercise per Week:   . Minutes of Exercise per Session:   Stress:   . Feeling of Stress :   Social Connections:   . Frequency  of Communication with Friends and Family:   . Frequency of Social Gatherings with Friends and Family:   . Attends Religious Services:   . Active Member of Clubs or Organizations:   . Attends Archivist Meetings:   Marland Kitchen Marital Status:   Intimate Partner Violence:   . Fear of Current or Ex-Partner:   . Emotionally Abused:   Marland Kitchen Physically Abused:   . Sexually Abused:     Physical Exam      Future Appointments  Date Time Provider [place]  12/03/2019 11:40 AM Larey Dresser, MD [facility] None  12/09/2019 10:15 AM Gardiner Barefoot, DPM [facility]  02/29/2020  1:45 PM Rankin, Clent Demark, MD [facility] None    BP 138/80  Pulse 68   Temp 97.9 F (36.6 C)   Resp 18   Wt 208 lb (94.3 kg)   SpO2 97%   BMI 32.58 kg/m   Weight yesterday-207 Last visit weight-207  Pt reports he is feeling good, when I arrived Amy from clinic called to report his cardiomems reading is up.  She wants him to increase his torsemide '40mg'$  BID until he is seen next week at clinic appointment.  He denies increased sob. No c/p, no dizziness. No c/p.  He does eat things with a lot of sodium in it-tried to speak to wife about it but she is persistent that she monitors that but he reports eating bologna, vienna weiners pizza for supper.  I advised them that him eating that consistently and collectively like he has done recently will make him retain fluid like this.  meds verified and pill box refilled.  Asked jenna about moms meals b/c he seems his food choices here lately have not been best and his fluid level has increased frequently to where he needs med changes by his cardiomems reading.  She will look into that.  They do go to food bank often and not really can be too choosy with what he has to eat.    [name], [facility] Paramedic  11/25/19

## 2019-12-02 ENCOUNTER — Other Ambulatory Visit (HOSPITAL_COMMUNITY): Payer: Self-pay

## 2019-12-02 NOTE — Progress Notes (Signed)
Paramedicine Encounter    Patient ID: [name], male    DOB: January 26, 1951, 69 y.o.   MRN: [id number]   Patient Care Team: Clinic, Medical, MD (Inactive) as PCP - General Sherren Mocha, MD as PCP - Cardiology (Cardiology) Larey Dresser, MD as PCP - Advanced Heart Failure (Cardiology)  Patient Active Problem List   Diagnosis Date Noted  . Hallux limitus of left foot 06/05/2019  . Hallux limitus of right foot 06/05/2019  . Seasonal allergies 02/12/2019  . Health care maintenance 02/12/2019  . GERD (gastroesophageal reflux disease) 02/12/2019  . Nocturnal hypoxemia 02/12/2019  . OSA (obstructive sleep apnea) 02/12/2019  . Pain due to onychomycosis of toenails of both feet 01/30/2019  . Lobar pneumonia ([place]) 12/29/2018  . Diabetes mellitus type 2, controlled, without complications ([place]) 28/76/8115  . Thoracic aortic aneurysm ([place]) 12/29/2018  . Acute on chronic combined systolic and diastolic CHF (congestive heart failure) ([place]) 12/25/2018  . Syncope 12/25/2018  . Chronic systolic heart failure ([place]) 11/19/2018  . Chronic venous insufficiency 06/24/2018  . Respiratory failure ([place]) 06/01/2018  . Leg swelling 05/06/2018  . Acute respiratory failure with hypoxemia ([place]) 10/14/2017  . Influenza-like illness 10/14/2017  . Delirium 10/14/2017  . Acute urinary retention 10/14/2017  . (HFpEF) heart failure with preserved ejection fraction ([place]), pulmonary edema and LBBB of unknown chronicity  10/29/2016  . Elevated troponin I level, presume demand ischemia  10/29/2016  . AKI (acute kidney injury) ([place]) 10/29/2016  . BPH (benign prostatic hyperplasia) 10/29/2016  . Sepsis ([place]) 10/29/2016  . Bacteremia due to Escherichia coli 10/29/2016  . Fatty liver 10/29/2016  . Acute pulmonary edema (HCC)   . Acute hypoxic and hypercarbic respiratory failure (HCC) in setting of bilateral pulmonary infiltrates. Presume CAP + pulmonary edema +/-ALI 10/27/2016  . Left-sided weakness 05/04/2015  .  TIA (transient ischemic attack) : Rule out. 05/04/2015  . Hyperlipidemia 05/04/2015  . Chest pain 10/10/2013  . SOB (shortness of breath) 10/10/2013  . Hyperglycemia without ketosis 10/17/2011  . HTN (hypertension) 10/17/2011  . Foot drop, left 10/17/2011  . Impacted cerumen of right ear 10/17/2011  . Dyspnea 10/17/2011  . History of TIA (transient ischemic attack) 10/17/2011  . Neuropathy ([place]) 10/17/2011  . Aortic insufficiency 03/16/2011  . Chest pain 03/16/2011    Current Outpatient Medications:  .  Accu-Chek FastClix Lancets MISC, USE UTD, Disp: , Rfl:  .  ACCU-CHEK GUIDE test strip, U TO TEST BLOOD SUGAR QD, Disp: , Rfl:  .  aspirin EC 81 MG tablet, Take 81 mg by mouth daily., Disp: , Rfl:  .  atorvastatin (LIPITOR) 40 MG tablet, Take 1 tablet (40 mg total) by mouth every evening. (Patient taking differently: Take 40 mg by mouth every evening. ), Disp: 30 tablet, Rfl: 11 .  Blood Glucose Monitoring Suppl (ACCU-CHEK GUIDE) w/Device KIT, U TO TEST BLOOD SUGAR QD, Disp: , Rfl:  .  carvedilol (COREG) 12.5 MG tablet, Take 1 tablet (12.5 mg total) by mouth 2 (two) times daily., Disp: 60 tablet, Rfl: 5 .  famotidine (PEPCID) 20 MG tablet, Take 20 mg by mouth at bedtime., Disp: , Rfl:  .  finasteride (PROSCAR) 5 MG tablet, Take 5 mg by mouth daily., Disp: , Rfl:  .  gabapentin (NEURONTIN) 300 MG capsule, Take 300 mg by mouth 4 (four) times daily. , Disp: , Rfl:  .  hydrOXYzine (VISTARIL) 50 MG capsule, Take 50 mg by mouth at bedtime. , Disp: , Rfl:  .  metFORMIN (GLUCOPHAGE) 1000 MG  tablet, Take 1,000 mg by mouth 2 (two) times a day. , Disp: , Rfl:  .  montelukast (SINGULAIR) 10 MG tablet, Take 10 mg by mouth daily. , Disp: , Rfl:  .  Multiple Vitamin (MULTIVITAMIN) tablet, Take 1 tablet by mouth daily., Disp: , Rfl:  .  pantoprazole (PROTONIX) 40 MG tablet, Take 1 tablet (40 mg total) by mouth daily., Disp: 30 tablet, Rfl: 1 .  potassium chloride SA (KLOR-CON) 20 MEQ tablet, Take 20 mEq  by mouth daily. , Disp: , Rfl:  .  SYMBICORT 80-4.5 MCG/ACT inhaler, Inhale 2 puffs into the lungs daily as needed. , Disp: , Rfl:  .  tamsulosin (FLOMAX) 0.4 MG CAPS capsule, Take 0.4 mg by mouth daily., Disp: , Rfl:  .  torsemide (DEMADEX) 20 MG tablet, Take 40 mg by mouth 2 (two) times daily. Take 2 tablets by mouth every morning and 1 tablet by mouth every evening , Disp: , Rfl:  .  VENTOLIN HFA 108 (90 Base) MCG/ACT inhaler, Inhale 2 puffs into the lungs every 6 (six) hours as needed for wheezing or shortness of breath. , Disp: , Rfl:  .  Dextromethorphan HBr (ROBITUSSIN LINGERING COUGHGELS) 15 MG CAPS, Take 15 mg by mouth daily as needed (cough)., Disp: , Rfl:  Allergies  Allergen Reactions  . Cashew Nut Oil Palpitations  . Percocet [Oxycodone-Acetaminophen] Other (See Comments)    Hallucintaions      Social History   Socioeconomic History  . Marital status: Married    Spouse name: Not on file  . Number of children: Not on file  . Years of education: Not on file  . Highest education level: Not on file  Occupational History  . Not on file  Tobacco Use  . Smoking status: Never Smoker  . Smokeless tobacco: Never Used  Substance and Sexual Activity  . Alcohol use: No  . Drug use: No  . Sexual activity: Not Currently    Partners: Female    Birth control/protection: None  Other Topics Concern  . Not on file  Social History Narrative   Patient lives in private residence with supportive spouse   Social Determinants of Health   Financial Resource Strain:   . Difficulty of Paying Living Expenses:   Food Insecurity:   . Worried About Charity fundraiser in the Last Year:   . Arboriculturist in the Last Year:   Transportation Needs:   . Film/video editor (Medical):   Marland Kitchen Lack of Transportation (Non-Medical):   Physical Activity:   . Days of Exercise per Week:   . Minutes of Exercise per Session:   Stress:   . Feeling of Stress :   Social Connections:   . Frequency  of Communication with Friends and Family:   . Frequency of Social Gatherings with Friends and Family:   . Attends Religious Services:   . Active Member of Clubs or Organizations:   . Attends Archivist Meetings:   Marland Kitchen Marital Status:   Intimate Partner Violence:   . Fear of Current or Ex-Partner:   . Emotionally Abused:   Marland Kitchen Physically Abused:   . Sexually Abused:     Physical Exam      Future Appointments  Date Time Provider [place]  12/03/2019 11:40 AM Larey Dresser, MD [facility] None  12/09/2019 10:15 AM Gardiner Barefoot, DPM [facility]  02/29/2020  1:45 PM Rankin, Clent Demark, MD [facility] None    BP 118/72  Pulse 70   Temp 97.6 F (36.4 C)   Resp 18   Wt 206 lb (93.4 kg)   SpO2 96%   BMI 32.26 kg/m   Weight yesterday-207 Last visit weight-208  Pt reports that he is doing ok.  He is getting the moms meals and has been helpful and he is enjoying them.  Pt denies sob, no dizziness, no c/p. He has clinic appoint tomor. Will see him tomor there as well.  He wears compression stockings daily.  No missed doses of his meds. meds verified and pill box refilled.     [name], [place] [facility] Paramedic  12/02/19

## 2019-12-03 ENCOUNTER — Ambulatory Visit (HOSPITAL_COMMUNITY)
Admission: RE | Admit: 2019-12-03 | Discharge: 2019-12-03 | Disposition: A | Discharge status: Home / Self Care | Payer: Medicare Other | Source: Ambulatory Visit | Attending: Cardiology | Admitting: Cardiology

## 2019-12-03 ENCOUNTER — Encounter (HOSPITAL_COMMUNITY): Payer: Self-pay | Admitting: Cardiology

## 2019-12-03 ENCOUNTER — Other Ambulatory Visit: Payer: Self-pay

## 2019-12-03 ENCOUNTER — Other Ambulatory Visit (HOSPITAL_COMMUNITY): Payer: Self-pay

## 2019-12-03 VITALS — BP 114/62 | HR 67 | Wt 210.6 lb

## 2019-12-03 DIAGNOSIS — G4733 Obstructive sleep apnea (adult) (pediatric): Secondary | ICD-10-CM | POA: Diagnosis not present

## 2019-12-03 DIAGNOSIS — I5022 Chronic systolic (congestive) heart failure: Secondary | ICD-10-CM | POA: Diagnosis not present

## 2019-12-03 DIAGNOSIS — E11621 Type 2 diabetes mellitus with foot ulcer: Secondary | ICD-10-CM | POA: Insufficient documentation

## 2019-12-03 DIAGNOSIS — I2510 Atherosclerotic heart disease of native coronary artery without angina pectoris: Secondary | ICD-10-CM | POA: Diagnosis not present

## 2019-12-03 DIAGNOSIS — I447 Left bundle-branch block, unspecified: Secondary | ICD-10-CM | POA: Insufficient documentation

## 2019-12-03 DIAGNOSIS — Z8674 Personal history of sudden cardiac arrest: Secondary | ICD-10-CM | POA: Diagnosis not present

## 2019-12-03 DIAGNOSIS — E1122 Type 2 diabetes mellitus with diabetic chronic kidney disease: Secondary | ICD-10-CM | POA: Diagnosis not present

## 2019-12-03 DIAGNOSIS — Z7984 Long term (current) use of oral hypoglycemic drugs: Secondary | ICD-10-CM | POA: Insufficient documentation

## 2019-12-03 DIAGNOSIS — N183 Chronic kidney disease, stage 3 unspecified: Secondary | ICD-10-CM | POA: Diagnosis not present

## 2019-12-03 DIAGNOSIS — I5042 Chronic combined systolic (congestive) and diastolic (congestive) heart failure: Secondary | ICD-10-CM | POA: Insufficient documentation

## 2019-12-03 DIAGNOSIS — Z9989 Dependence on other enabling machines and devices: Secondary | ICD-10-CM | POA: Insufficient documentation

## 2019-12-03 DIAGNOSIS — Z8673 Personal history of transient ischemic attack (TIA), and cerebral infarction without residual deficits: Secondary | ICD-10-CM | POA: Diagnosis not present

## 2019-12-03 DIAGNOSIS — E1165 Type 2 diabetes mellitus with hyperglycemia: Secondary | ICD-10-CM | POA: Diagnosis not present

## 2019-12-03 DIAGNOSIS — Z8249 Family history of ischemic heart disease and other diseases of the circulatory system: Secondary | ICD-10-CM | POA: Insufficient documentation

## 2019-12-03 DIAGNOSIS — Z79899 Other long term (current) drug therapy: Secondary | ICD-10-CM | POA: Insufficient documentation

## 2019-12-03 DIAGNOSIS — L97508 Non-pressure chronic ulcer of other part of unspecified foot with other specified severity: Secondary | ICD-10-CM | POA: Insufficient documentation

## 2019-12-03 DIAGNOSIS — I712 Thoracic aortic aneurysm, without rupture: Secondary | ICD-10-CM | POA: Diagnosis not present

## 2019-12-03 DIAGNOSIS — Z95811 Presence of heart assist device: Secondary | ICD-10-CM | POA: Diagnosis not present

## 2019-12-03 DIAGNOSIS — I130 Hypertensive heart and chronic kidney disease with heart failure and stage 1 through stage 4 chronic kidney disease, or unspecified chronic kidney disease: Secondary | ICD-10-CM | POA: Diagnosis not present

## 2019-12-03 DIAGNOSIS — E785 Hyperlipidemia, unspecified: Secondary | ICD-10-CM | POA: Diagnosis not present

## 2019-12-03 DIAGNOSIS — Z7982 Long term (current) use of aspirin: Secondary | ICD-10-CM | POA: Insufficient documentation

## 2019-12-03 LAB — BASIC METABOLIC PANEL
Anion gap: 11 (ref 5–15)
BUN: 38 mg/dL — ABNORMAL HIGH (ref 8–23)
CO2: 30 mmol/L (ref 22–32)
Calcium: 8.3 mg/dL — ABNORMAL LOW (ref 8.9–10.3)
Chloride: 99 mmol/L (ref 98–111)
Creatinine, Ser: 1.61 mg/dL — ABNORMAL HIGH (ref 0.61–1.24)
GFR calc Af Amer: 49 mL/min — ABNORMAL LOW (ref >60)
GFR calc non Af Amer: 43 mL/min — ABNORMAL LOW (ref >60)
Glucose, Bld: 155 mg/dL — ABNORMAL HIGH (ref 70–99)
Potassium: 4.6 mmol/L (ref 3.5–5.1)
Sodium: 140 mmol/L (ref 135–145)

## 2019-12-03 MED ORDER — TORSEMIDE 20 MG PO TABS: ORAL_TABLET | ORAL | 5 refills | Status: DC

## 2019-12-03 NOTE — Progress Notes (Signed)
Paramedicine Encounter   Patient ID: [name] , male,   DOB: Mar 20, 1951,69 y.o.,  MRN: [id number]   Met patient in clinic today with provider.   Weight @ clinic-210 B/p-114/62 p-67 sp02-96  cardiomems number is trending up.  Denies sob. Denies c/p.  Labs done today and will be done again in 10days.  CAT scan is due to look at his aorta.  Minimal swelling to his ankles.  Torsemide increase to '60mg'$ /'40mg'$ .  ECHO is due as well.  Will come back in a month.  Pt reports he is able to take the additional torsemide on his own. Will f/u next week.   [name], [place] 12/03/2019

## 2019-12-03 NOTE — Patient Instructions (Addendum)
INCREASE Torsemide to 60mg  (3 tabs) in the morning and 40mg  (2 tabs) in the evening  Labs today and repeat in 10 days We will only contact you if something comes back abnormal or we need to make some changes. Otherwise no news is good news!  Your physician has requested that you have an echocardiogram. Echocardiography is a painless test that uses sound waves to create images of your heart. It provides your doctor with information about the size and shape of your heart and how well your heart's chambers and valves are working. This procedure takes approximately one hour. There are no restrictions for this procedure.   Your physician recommends that you schedule a follow-up appointment in: 1 month with the Nurse Practitioner  Please call office at [phone] option 2 if you have any questions or concerns.   At the [facility], you and your health needs are our priority. As part of our continuing mission to provide you with exceptional heart care, we have created designated Provider Care Teams. These Care Teams include your primary Cardiologist (physician) and Advanced Practice Providers (APPs- Physician Assistants and Nurse Practitioners) who all work together to provide you with the care you need, when you need it.   You may see any of the following providers on your designated Care Team at your next follow up: Dr [phone] . Dr Marland Kitchen . Arvilla Meres, NP . Marca Ancona, PA . Tonye Becket, PharmD   Please be sure to bring in all your medications bottles to every appointment.

## 2019-12-03 NOTE — Telephone Encounter (Signed)
Wife says maybe later in the year she will try another over night pulse ox but not now. She says he is better and she wants to enjoy him like this for awhile.

## 2019-12-04 NOTE — Progress Notes (Signed)
Advanced Heart Failure Clinic Note  HF Cardiology: Dr. Aundra Dubin  Reason for Visit: follow-up for chronic Diastolic CHF  69 y.o. with history of CHF, [name], [name] for followup of CHF.  He has a history of mild LV systolic dysfunction (EF 50-93% range) with probably significant RV failure.  Patient was admitted in 4/20 with hypoxic respiratory failure due to PNA and decompensated HF.  Hospitalization was complicated by PEA arrest and AKI.   In 6/20, he was again admitted with hypoxemic respiratory failure with hypertensive crisis, CHF, and PNA.    Due to frequent hospital admission for CHF, he recently underwent Cardiomems implantation, done in 11/20.  RHC at that time showed optimized filling pressures after adjustment of torsemide. He was also recently set up for outpatient sleep study evaluation for fatigue, snoring and excessive daytime sleepiness. He was found to have severe OSA and is now using Bipap. He was also referred to HF paramedicine program.   Weight is stable today.  No dyspnea walking on flat ground, he does get short of breath walking up incline. No orthopnea but sleeps with HOB elevated due to reflux.  No chest pain.  No palpitations.  Cardiomems with elevated PADP recently, torsemide has been increased to 40 mg bid.   PADP (Cardiomems) 24 mmHg (goal < 18), has been elevated recently.   Labs (4/20): LDL 91 Labs (9/20): K 4, creatinine 1.95 => 2.1 Labs (10/20): K 4.3, creatinine 1.8 Labs (11/20): K 4.9, creatinine 1.73 Labs (2/21): K 4.1, creatinine 1.9 Labs (3/21): K 3.9, creatinine 1.75  PMH: 1. HTN 2. [name] stage 3 3. Type 2 diabetes 4. Hyperlipidemia 5. COPD: Suspected 6. PNA: Recurrent, suspected aspiration.  7. PEA arrest 4/20: Respiratory arrest.  8. LBBB 9. TIA 10. Ascending aortic aneurysm: CT chest in 6/20 with 5.1 cm ascending aortic aneurysm. 11. Chronic primarily diastolic CHF:  - LHC (2/67): Nonobstructive CAD.  - Echo (12/19): EF 45-50%.   - Echo (4/20): EF 45-50%, diffuse hypokinesis, normal RV.  - Cardiomems placement (11/20). - RHC (11/20): mean RA 2, PA 33/8, mean PCWP 11, CI 3.32 12. Ventricular ectopy: 8/20 monitor showed PVCs and NSVT.  13. Peripheral arterial dopplers (10/20): No significant stenosis.   14. OSA: Bipap  Social History   Socioeconomic History  . Marital status: Married    Spouse name: Not on file  . Number of children: Not on file  . Years of education: Not on file  . Highest education level: Not on file  Occupational History  . Not on file  Tobacco Use  . Smoking status: Never Smoker  . Smokeless tobacco: Never Used  Substance and Sexual Activity  . Alcohol use: No  . Drug use: No  . Sexual activity: Not Currently    Partners: Female    Birth control/protection: None  Other Topics Concern  . Not on file  Social History Narrative   Patient lives in private residence with supportive spouse   Social Determinants of Health   Financial Resource Strain:   . Difficulty of Paying Living Expenses:   Food Insecurity:   . Worried About Charity fundraiser in the Last Year:   . Arboriculturist in the Last Year:   Transportation Needs:   . Film/video editor (Medical):   Marland Kitchen Lack of Transportation (Non-Medical):   Physical Activity:   . Days of Exercise per Week:   . Minutes of Exercise per Session:   Stress:   . Feeling  of Stress :   Social Connections:   . Frequency of Communication with Friends and Family:   . Frequency of Social Gatherings with Friends and Family:   . Attends Religious Services:   . Active Member of Clubs or Organizations:   . Attends Archivist Meetings:   Marland Kitchen Marital Status:   Intimate Partner Violence:   . Fear of Current or Ex-Partner:   . Emotionally Abused:   Marland Kitchen Physically Abused:   . Sexually Abused:    Family History  Problem Relation Age of Onset  . Emphysema Mother   . Aneurysm Mother   . Emphysema Father   . Coronary artery disease  Brother   . Coronary artery disease Brother   . Coronary artery disease Sister    ROS: All systems reviewed and negative except as per HPI.   Current Outpatient Medications  Medication Sig Dispense Refill  . Accu-Chek FastClix Lancets MISC USE UTD    . ACCU-CHEK GUIDE test strip U TO TEST BLOOD SUGAR QD    . aspirin EC 81 MG tablet Take 81 mg by mouth daily.    Marland Kitchen atorvastatin (LIPITOR) 40 MG tablet Take 1 tablet (40 mg total) by mouth every evening. (Patient taking differently: Take 40 mg by mouth every evening. ) 30 tablet 11  . Blood Glucose Monitoring Suppl (ACCU-CHEK GUIDE) w/Device KIT U TO TEST BLOOD SUGAR QD    . carvedilol (COREG) 12.5 MG tablet Take 1 tablet (12.5 mg total) by mouth 2 (two) times daily. 60 tablet 5  . Dextromethorphan HBr (ROBITUSSIN LINGERING COUGHGELS) 15 MG CAPS Take 15 mg by mouth daily as needed (cough).    . famotidine (PEPCID) 20 MG tablet Take 20 mg by mouth at bedtime.    . gabapentin (NEURONTIN) 300 MG capsule Take 300 mg by mouth 4 (four) times daily.     . hydrOXYzine (VISTARIL) 50 MG capsule Take 50 mg by mouth at bedtime.     . metFORMIN (GLUCOPHAGE) 1000 MG tablet Take 1,000 mg by mouth 2 (two) times a day.     . montelukast (SINGULAIR) 10 MG tablet Take 10 mg by mouth daily.     . Multiple Vitamin (MULTIVITAMIN) tablet Take 1 tablet by mouth daily.    . pantoprazole (PROTONIX) 40 MG tablet Take 1 tablet (40 mg total) by mouth daily. 30 tablet 1  . potassium chloride SA (KLOR-CON) 20 MEQ tablet Take 20 mEq by mouth daily.     . SYMBICORT 80-4.5 MCG/ACT inhaler Inhale 2 puffs into the lungs daily as needed.     . tamsulosin (FLOMAX) 0.4 MG CAPS capsule Take 0.4 mg by mouth daily.    Marland Kitchen torsemide (DEMADEX) 20 MG tablet Take 3 tablets (60 mg total) by mouth every morning AND 2 tablets (40 mg total) every evening. Take 2 tablets by mouth every morning and 1 tablet by mouth every evening . 150 tablet 5  . VENTOLIN HFA 108 (90 Base) MCG/ACT inhaler Inhale 2  puffs into the lungs every 6 (six) hours as needed for wheezing or shortness of breath.     . finasteride (PROSCAR) 5 MG tablet Take 5 mg by mouth daily.     No current facility-administered medications for this encounter.   BP 114/62   Pulse 67   Wt 95.5 kg (210 lb 9.6 oz)   SpO2 96%   BMI 32.98 kg/m  General: NAD Neck: JVP 8 cm with HJR, no thyromegaly or thyroid nodule.  Lungs: Clear to  auscultation bilaterally with normal respiratory effort. CV: Nondisplaced PMI.  Heart regular S1/S2, no S3/S4, 2/6 SEM RUSB. 1+ ankle edema.  No carotid bruit.  Normal pedal pulses.  Abdomen: Soft, nontender, no hepatosplenomegaly, no distention.  Skin: Intact without lesions or rashes.  Neurologic: Alert and oriented x 3.  Psych: Normal affect. Extremities: No clubbing or cyanosis.  HEENT: Normal.   Assessment/Plan: 1. Chronic primarily diastolic CHF: Suspect that there is also significant RV failure.  Last echo in 4/20 with EF 45-50%.  Cath in 9/12 showed nonobstructive coronary disease.  S/p Cardiomems implant 11/20. Weight is stable but he is mildly volume overloaded by exam.  This correlated with elevated PADP by Cardiomems.  NYHA class II symptoms.   - Increase torsemide to 60 qam/40 qpm, BMET today and in 10 days.  - Continue Coreg 12.5 mg bid.  - Continue to follow Cardiomems.  - I will arrange for repeat echo.  2. [name]: Stage 3.  BMET today.  3. OSA: Now on Bipap.  4. Leg/foot ulcers: Peripheral arterial dopplers did not show significant PAD, suspect venous insufficiency. Ulcers are healing.  5. Recurrent PNA: ?Aspiration.  6. LBBB: Chronic.  7. Ascending aortic aneurysm: CT chest 6/20 with 5.1 cm ascending aortic aneurysm.   - With [name] stage 3, will obtain repeat CT chest w/o contrast to follow ascending aorta.    Followup 1 month with NP/PA.   Loralie Champagne 12/04/2019

## 2019-12-07 ENCOUNTER — Ambulatory Visit (HOSPITAL_COMMUNITY)
Admission: RE | Admit: 2019-12-07 | Discharge: 2019-12-07 | Disposition: A | Discharge status: Home / Self Care | Payer: Medicare Other | Source: Ambulatory Visit | Attending: Adult Health | Admitting: Adult Health

## 2019-12-07 ENCOUNTER — Telehealth (HOSPITAL_COMMUNITY): Payer: Self-pay | Admitting: Adult Health

## 2019-12-07 DIAGNOSIS — I5022 Chronic systolic (congestive) heart failure: Secondary | ICD-10-CM

## 2019-12-07 MED ORDER — TORSEMIDE 20 MG PO TABS: 60.0000 mg | ORAL_TABLET | Freq: Two times a day (BID) | ORAL | 5 refills | Status: DC

## 2019-12-07 NOTE — Progress Notes (Signed)
PAD Goal 18  Todays reading is 24 mm hg.   I have reviewed current PAD from cardiomems device. PAD reading elevated.   Increase torsemide to 60 mg twice a day. He was called with medication changes.    Follow monthly readings.   Montrail Mehrer  NP-C  5:34 PM

## 2019-12-07 NOTE — Telephone Encounter (Signed)
Left VM for pt to return my call  

## 2019-12-07 NOTE — Addendum Note (Signed)
Addended by: Modesta Messing on: 12/07/2019 04:22 PM   Modules accepted: Orders

## 2019-12-07 NOTE — Telephone Encounter (Signed)
Pt returned call he verbalized understanding of medication change.

## 2019-12-07 NOTE — Telephone Encounter (Signed)
° °  Please call.  Cardiomems elevated at 24.   Increase torsemide 60 mg twice a day.   Darris Carachure NP-C  11:21 AM

## 2019-12-09 ENCOUNTER — Other Ambulatory Visit (HOSPITAL_COMMUNITY): Payer: Self-pay

## 2019-12-09 ENCOUNTER — Ambulatory Visit (INDEPENDENT_AMBULATORY_CARE_PROVIDER_SITE_OTHER): Payer: Medicare Other | Admitting: Podiatry

## 2019-12-09 ENCOUNTER — Encounter: Payer: Self-pay | Admitting: Podiatry

## 2019-12-09 ENCOUNTER — Other Ambulatory Visit: Payer: Self-pay

## 2019-12-09 DIAGNOSIS — I872 Venous insufficiency (chronic) (peripheral): Secondary | ICD-10-CM

## 2019-12-09 DIAGNOSIS — M205X2 Other deformities of toe(s) (acquired), left foot: Secondary | ICD-10-CM

## 2019-12-09 DIAGNOSIS — E119 Type 2 diabetes mellitus without complications: Secondary | ICD-10-CM | POA: Diagnosis not present

## 2019-12-09 DIAGNOSIS — M79675 Pain in left toe(s): Secondary | ICD-10-CM | POA: Diagnosis not present

## 2019-12-09 DIAGNOSIS — M79674 Pain in right toe(s): Secondary | ICD-10-CM

## 2019-12-09 DIAGNOSIS — M205X1 Other deformities of toe(s) (acquired), right foot: Secondary | ICD-10-CM

## 2019-12-09 DIAGNOSIS — B351 Tinea unguium: Secondary | ICD-10-CM | POA: Diagnosis not present

## 2019-12-09 NOTE — Progress Notes (Signed)
Paramedicine Encounter    Patient ID: [name], male    DOB: [birth year], 69 y.o.   MRN: [id number]   Patient Care Team: Clinic, Medical, MD (Inactive) as PCP - General Sherren Mocha, MD as PCP - Cardiology (Cardiology) Larey Dresser, MD as PCP - Advanced Heart Failure (Cardiology)  Patient Active Problem List   Diagnosis Date Noted  . Hallux limitus of left foot 06/05/2019  . Hallux limitus of right foot 06/05/2019  . Seasonal allergies 02/12/2019  . Health care maintenance 02/12/2019  . GERD (gastroesophageal reflux disease) 02/12/2019  . Nocturnal hypoxemia 02/12/2019  . OSA (obstructive sleep apnea) 02/12/2019  . Pain due to onychomycosis of toenails of both feet 01/30/2019  . Lobar pneumonia ([place]) 12/29/2018  . Diabetes mellitus type 2, controlled, without complications ([place]) 97/58/8325  . Thoracic aortic aneurysm ([place]) 12/29/2018  . Acute on chronic combined systolic and diastolic CHF (congestive heart failure) ([place]) 12/25/2018  . Syncope 12/25/2018  . Chronic systolic heart failure ([place]) 11/19/2018  . Chronic venous insufficiency 06/24/2018  . Respiratory failure ([place]) 06/01/2018  . Leg swelling 05/06/2018  . Acute respiratory failure with hypoxemia ([place]) 10/14/2017  . Influenza-like illness 10/14/2017  . Delirium 10/14/2017  . Acute urinary retention 10/14/2017  . (HFpEF) heart failure with preserved ejection fraction ([place]), pulmonary edema and LBBB of unknown chronicity  10/29/2016  . Elevated troponin I level, presume demand ischemia  10/29/2016  . AKI (acute kidney injury) ([place]) 10/29/2016  . BPH (benign prostatic hyperplasia) 10/29/2016  . Sepsis ([place]) 10/29/2016  . Bacteremia due to Escherichia coli 10/29/2016  . Fatty liver 10/29/2016  . Acute pulmonary edema (HCC)   . Acute hypoxic and hypercarbic respiratory failure (HCC) in setting of bilateral pulmonary infiltrates. Presume CAP + pulmonary edema +/-ALI 10/27/2016  . Left-sided weakness 05/04/2015  .  TIA (transient ischemic attack) : Rule out. 05/04/2015  . Hyperlipidemia 05/04/2015  . Chest pain 10/10/2013  . SOB (shortness of breath) 10/10/2013  . Hyperglycemia without ketosis 10/17/2011  . HTN (hypertension) 10/17/2011  . Foot drop, left 10/17/2011  . Impacted cerumen of right ear 10/17/2011  . Dyspnea 10/17/2011  . History of TIA (transient ischemic attack) 10/17/2011  . Neuropathy ([place]) 10/17/2011  . Aortic insufficiency 03/16/2011  . Chest pain 03/16/2011    Current Outpatient Medications:  .  Accu-Chek FastClix Lancets MISC, USE UTD, Disp: , Rfl:  .  ACCU-CHEK GUIDE test strip, U TO TEST BLOOD SUGAR QD, Disp: , Rfl:  .  aspirin EC 81 MG tablet, Take 81 mg by mouth daily., Disp: , Rfl:  .  atorvastatin (LIPITOR) 40 MG tablet, Take 1 tablet (40 mg total) by mouth every evening. (Patient taking differently: Take 40 mg by mouth every evening. ), Disp: 30 tablet, Rfl: 11 .  Blood Glucose Monitoring Suppl (ACCU-CHEK GUIDE) w/Device KIT, U TO TEST BLOOD SUGAR QD, Disp: , Rfl:  .  carvedilol (COREG) 12.5 MG tablet, Take 1 tablet (12.5 mg total) by mouth 2 (two) times daily., Disp: 60 tablet, Rfl: 5 .  famotidine (PEPCID) 20 MG tablet, Take 20 mg by mouth at bedtime., Disp: , Rfl:  .  finasteride (PROSCAR) 5 MG tablet, Take 5 mg by mouth daily., Disp: , Rfl:  .  gabapentin (NEURONTIN) 300 MG capsule, Take 300 mg by mouth 4 (four) times daily. , Disp: , Rfl:  .  metFORMIN (GLUCOPHAGE) 1000 MG tablet, Take 1,000 mg by mouth 2 (two) times a day. , Disp: , Rfl:  .  montelukast (  SINGULAIR) 10 MG tablet, Take 10 mg by mouth daily. , Disp: , Rfl:  .  Multiple Vitamin (MULTIVITAMIN) tablet, Take 1 tablet by mouth daily., Disp: , Rfl:  .  pantoprazole (PROTONIX) 40 MG tablet, Take 1 tablet (40 mg total) by mouth daily., Disp: 30 tablet, Rfl: 1 .  potassium chloride SA (KLOR-CON) 20 MEQ tablet, Take 20 mEq by mouth daily. , Disp: , Rfl:  .  SYMBICORT 80-4.5 MCG/ACT inhaler, Inhale 2 puffs into  the lungs daily as needed. , Disp: , Rfl:  .  tamsulosin (FLOMAX) 0.4 MG CAPS capsule, Take 0.4 mg by mouth daily., Disp: , Rfl:  .  torsemide (DEMADEX) 20 MG tablet, Take 3 tablets (60 mg total) by mouth 2 (two) times daily., Disp: 180 tablet, Rfl: 5 .  VENTOLIN HFA 108 (90 Base) MCG/ACT inhaler, Inhale 2 puffs into the lungs every 6 (six) hours as needed for wheezing or shortness of breath. , Disp: , Rfl:  .  Dextromethorphan HBr (ROBITUSSIN LINGERING COUGHGELS) 15 MG CAPS, Take 15 mg by mouth daily as needed (cough)., Disp: , Rfl:  .  hydrOXYzine (VISTARIL) 50 MG capsule, Take 50 mg by mouth at bedtime. , Disp: , Rfl:  Allergies  Allergen Reactions  . Cashew Nut Oil Palpitations  . Percocet [Oxycodone-Acetaminophen] Other (See Comments)    Hallucintaions      Social History   Socioeconomic History  . Marital status: Married    Spouse name: Not on file  . Number of children: Not on file  . Years of education: Not on file  . Highest education level: Not on file  Occupational History  . Not on file  Tobacco Use  . Smoking status: Never Smoker  . Smokeless tobacco: Never Used  Substance and Sexual Activity  . Alcohol use: No  . Drug use: No  . Sexual activity: Not Currently    Partners: Female    Birth control/protection: None  Other Topics Concern  . Not on file  Social History Narrative   Patient lives in private residence with supportive spouse   Social Determinants of Health   Financial Resource Strain:   . Difficulty of Paying Living Expenses:   Food Insecurity:   . Worried About Charity fundraiser in the Last Year:   . Arboriculturist in the Last Year:   Transportation Needs:   . Film/video editor (Medical):   Marland Kitchen Lack of Transportation (Non-Medical):   Physical Activity:   . Days of Exercise per Week:   . Minutes of Exercise per Session:   Stress:   . Feeling of Stress :   Social Connections:   . Frequency of Communication with Friends and Family:   .  Frequency of Social Gatherings with Friends and Family:   . Attends Religious Services:   . Active Member of Clubs or Organizations:   . Attends Archivist Meetings:   Marland Kitchen Marital Status:   Intimate Partner Violence:   . Fear of Current or Ex-Partner:   . Emotionally Abused:   Marland Kitchen Physically Abused:   . Sexually Abused:     Physical Exam      Future Appointments  Date Time Provider [place]  12/09/2019  1:15 PM Gardiner Barefoot, DPM [facility]  12/14/2019 12:30 PM [facility] LAB [facility] None  12/14/2019  1:00 PM [place] [facility] OP 1 [facility]  12/31/2019  3:30 PM [facility] PA/NP [facility] None  02/29/2020  1:45 PM Rankin, Clent Demark, MD [facility]  None    BP 130/80   Pulse 66   Temp 98 F (36.7 C)   Resp 18   Wt 204 lb (92.5 kg)   SpO2 96%   BMI 31.95 kg/m   Weight yesterday-204 Last visit weight-210 @ clinic  Pt reports he has been feeling better since the increase of torsemide the other day.  His weight is down. He reports sleeping better than he has in a long time.  No missed doses of his meds.  meds verified and pill box refilled.  He is having cataract surgery next Wednesday morning so they will call me when they return and I will come out.  He denies increased sob, no dizziness, no c/p. No edema noted.  I have cautioned him again with his diet and to be careful what he eats. They do go to food pantry a lot.   [name], [facility] Paramedic  12/09/19

## 2019-12-09 NOTE — Progress Notes (Signed)
This patient returns to my office for at risk foot care.  This patient requires this care by a professional since this patient will be at risk due to having diabetes type 2 and chronic venous insufficiency.  This patient is unable to cut nails himself since the patient cannot reach his nails.These nails are painful walking and wearing shoes.  This patient presents for at risk foot care today.  General Appearance  Alert, conversant and in no acute stress.  Vascular  Dorsalis pedis and posterior tibial  pulses are not  palpable  bilaterally.  Capillary return is within normal limits  bilaterally. Temperature is within normal limits  bilaterally.  Neurologic  Senn-Weinstein monofilament wire test diminished/absent  bilaterally. Muscle power within normal limits bilaterally.  Nails Thick disfigured discolored nails with subungual debris  from hallux to fifth toes bilaterally. No evidence of bacterial infection or drainage bilaterally.  Orthopedic  No limitations of motion  feet .  No crepitus or effusions noted.  No bony pathology or digital deformities noted.  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  Midfoot  DJD  B/L.  Limited rearfoot  ROM  B/L.  Skin  normotropic skin with no porokeratosis noted bilaterally.  No signs of infections or ulcers noted.     Onychomycosis  Pain in right toes  Pain in left toes  Consent was obtained for treatment procedures.   Mechanical debridement of nails 1-5  bilaterally performed with a nail nipper.  Filed with dremel without incident.    Return office visit    3 months                  Told patient to return for periodic foot care and evaluation due to potential at risk complications.   Elta Angell DPM  

## 2019-12-14 ENCOUNTER — Ambulatory Visit (HOSPITAL_BASED_OUTPATIENT_CLINIC_OR_DEPARTMENT_OTHER)
Admission: RE | Admit: 2019-12-14 | Discharge: 2019-12-14 | Disposition: A | Discharge status: Home / Self Care | Payer: Medicare Other | Source: Ambulatory Visit

## 2019-12-14 ENCOUNTER — Ambulatory Visit (HOSPITAL_COMMUNITY)
Admission: RE | Admit: 2019-12-14 | Discharge: 2019-12-14 | Disposition: A | Discharge status: Home / Self Care | Payer: Medicare Other | Source: Ambulatory Visit | Attending: Internal Medicine | Admitting: Internal Medicine

## 2019-12-14 ENCOUNTER — Other Ambulatory Visit: Payer: Self-pay

## 2019-12-14 DIAGNOSIS — I083 Combined rheumatic disorders of mitral, aortic and tricuspid valves: Secondary | ICD-10-CM | POA: Insufficient documentation

## 2019-12-14 DIAGNOSIS — E119 Type 2 diabetes mellitus without complications: Secondary | ICD-10-CM | POA: Insufficient documentation

## 2019-12-14 DIAGNOSIS — E785 Hyperlipidemia, unspecified: Secondary | ICD-10-CM | POA: Diagnosis not present

## 2019-12-14 DIAGNOSIS — I5022 Chronic systolic (congestive) heart failure: Secondary | ICD-10-CM

## 2019-12-14 DIAGNOSIS — I110 Hypertensive heart disease with heart failure: Secondary | ICD-10-CM | POA: Diagnosis not present

## 2019-12-14 LAB — BASIC METABOLIC PANEL
Anion gap: 11 (ref 5–15)
BUN: 44 mg/dL — ABNORMAL HIGH (ref 8–23)
CO2: 29 mmol/L (ref 22–32)
Calcium: 8.9 mg/dL (ref 8.9–10.3)
Chloride: 98 mmol/L (ref 98–111)
Creatinine, Ser: 1.69 mg/dL — ABNORMAL HIGH (ref 0.61–1.24)
GFR calc Af Amer: 47 mL/min — ABNORMAL LOW (ref >60)
GFR calc non Af Amer: 40 mL/min — ABNORMAL LOW (ref >60)
Glucose, Bld: 204 mg/dL — ABNORMAL HIGH (ref 70–99)
Potassium: 4.2 mmol/L (ref 3.5–5.1)
Sodium: 138 mmol/L (ref 135–145)

## 2019-12-14 NOTE — Progress Notes (Signed)
  Echocardiogram 2D Echocardiogram has been performed.  [name] 12/14/2019, 1:36 PM

## 2019-12-16 ENCOUNTER — Telehealth (HOSPITAL_COMMUNITY): Payer: Self-pay | Admitting: Licensed Clinical Social Worker

## 2019-12-16 ENCOUNTER — Other Ambulatory Visit (HOSPITAL_COMMUNITY): Payer: Self-pay

## 2019-12-16 NOTE — Progress Notes (Signed)
Paramedicine Encounter    Patient ID: [name], male    DOB: 21-Jul-1950, 69 y.o.   MRN: [id number]   Patient Care Team: Care, Jinny Blossom [facility] as PCP - General (Family Medicine) Sherren Mocha, MD as PCP - Cardiology (Cardiology) Larey Dresser, MD as PCP - Advanced Heart Failure (Cardiology)  Patient Active Problem List   Diagnosis Date Noted  . Hallux limitus of left foot 06/05/2019  . Hallux limitus of right foot 06/05/2019  . Seasonal allergies 02/12/2019  . Health care maintenance 02/12/2019  . GERD (gastroesophageal reflux disease) 02/12/2019  . Nocturnal hypoxemia 02/12/2019  . OSA (obstructive sleep apnea) 02/12/2019  . Pain due to onychomycosis of toenails of both feet 01/30/2019  . Lobar pneumonia ([place]) 12/29/2018  . Diabetes mellitus type 2, controlled, without complications ([place]) 77/41/2878  . Thoracic aortic aneurysm ([place]) 12/29/2018  . Acute on chronic combined systolic and diastolic CHF (congestive heart failure) ([place]) 12/25/2018  . Syncope 12/25/2018  . Chronic systolic heart failure ([place]) 11/19/2018  . Chronic venous insufficiency 06/24/2018  . Respiratory failure ([place]) 06/01/2018  . Leg swelling 05/06/2018  . Acute respiratory failure with hypoxemia ([place]) 10/14/2017  . Influenza-like illness 10/14/2017  . Delirium 10/14/2017  . Acute urinary retention 10/14/2017  . (HFpEF) heart failure with preserved ejection fraction ([place]), pulmonary edema and LBBB of unknown chronicity  10/29/2016  . Elevated troponin I level, presume demand ischemia  10/29/2016  . AKI (acute kidney injury) ([place]) 10/29/2016  . BPH (benign prostatic hyperplasia) 10/29/2016  . Sepsis ([place]) 10/29/2016  . Bacteremia due to Escherichia coli 10/29/2016  . Fatty liver 10/29/2016  . Acute pulmonary edema (HCC)   . Acute hypoxic and hypercarbic respiratory failure (HCC) in setting of bilateral pulmonary infiltrates. Presume CAP + pulmonary edema +/-ALI 10/27/2016  . Left-sided  weakness 05/04/2015  . TIA (transient ischemic attack) : Rule out. 05/04/2015  . Hyperlipidemia 05/04/2015  . Chest pain 10/10/2013  . SOB (shortness of breath) 10/10/2013  . Hyperglycemia without ketosis 10/17/2011  . HTN (hypertension) 10/17/2011  . Foot drop, left 10/17/2011  . Impacted cerumen of right ear 10/17/2011  . Dyspnea 10/17/2011  . History of TIA (transient ischemic attack) 10/17/2011  . Neuropathy ([place]) 10/17/2011  . Aortic insufficiency 03/16/2011  . Chest pain 03/16/2011    Current Outpatient Medications:  .  Accu-Chek FastClix Lancets MISC, USE UTD, Disp: , Rfl:  .  ACCU-CHEK GUIDE test strip, U TO TEST BLOOD SUGAR QD, Disp: , Rfl:  .  aspirin EC 81 MG tablet, Take 81 mg by mouth daily., Disp: , Rfl:  .  atorvastatin (LIPITOR) 40 MG tablet, Take 1 tablet (40 mg total) by mouth every evening. (Patient taking differently: Take 40 mg by mouth every evening. ), Disp: 30 tablet, Rfl: 11 .  Blood Glucose Monitoring Suppl (ACCU-CHEK GUIDE) w/Device KIT, U TO TEST BLOOD SUGAR QD, Disp: , Rfl:  .  carvedilol (COREG) 12.5 MG tablet, Take 1 tablet (12.5 mg total) by mouth 2 (two) times daily., Disp: 60 tablet, Rfl: 5 .  famotidine (PEPCID) 20 MG tablet, Take 20 mg by mouth at bedtime., Disp: , Rfl:  .  finasteride (PROSCAR) 5 MG tablet, Take 5 mg by mouth daily., Disp: , Rfl:  .  gabapentin (NEURONTIN) 300 MG capsule, Take 300 mg by mouth 4 (four) times daily. , Disp: , Rfl:  .  hydrOXYzine (VISTARIL) 50 MG capsule, Take 50 mg by mouth at bedtime. , Disp: , Rfl:  .  metFORMIN (  GLUCOPHAGE) 1000 MG tablet, Take 1,000 mg by mouth 2 (two) times a day. , Disp: , Rfl:  .  montelukast (SINGULAIR) 10 MG tablet, Take 10 mg by mouth daily. , Disp: , Rfl:  .  Multiple Vitamin (MULTIVITAMIN) tablet, Take 1 tablet by mouth daily., Disp: , Rfl:  .  pantoprazole (PROTONIX) 40 MG tablet, Take 1 tablet (40 mg total) by mouth daily., Disp: 30 tablet, Rfl: 1 .  potassium chloride SA (KLOR-CON) 20  MEQ tablet, Take 20 mEq by mouth daily. , Disp: , Rfl:  .  SYMBICORT 80-4.5 MCG/ACT inhaler, Inhale 2 puffs into the lungs daily as needed. , Disp: , Rfl:  .  tamsulosin (FLOMAX) 0.4 MG CAPS capsule, Take 0.4 mg by mouth daily., Disp: , Rfl:  .  torsemide (DEMADEX) 20 MG tablet, Take 3 tablets (60 mg total) by mouth 2 (two) times daily., Disp: 180 tablet, Rfl: 5 .  VENTOLIN HFA 108 (90 Base) MCG/ACT inhaler, Inhale 2 puffs into the lungs every 6 (six) hours as needed for wheezing or shortness of breath. , Disp: , Rfl:  .  Dextromethorphan HBr (ROBITUSSIN LINGERING COUGHGELS) 15 MG CAPS, Take 15 mg by mouth daily as needed (cough). (Patient not taking: Reported on 12/16/2019), Disp: , Rfl:  Allergies  Allergen Reactions  . Cashew Nut Oil Palpitations  . Percocet [Oxycodone-Acetaminophen] Other (See Comments)    Hallucintaions      Social History   Socioeconomic History  . Marital status: Married    Spouse name: Not on file  . Number of children: Not on file  . Years of education: Not on file  . Highest education level: Not on file  Occupational History  . Not on file  Tobacco Use  . Smoking status: Never Smoker  . Smokeless tobacco: Never Used  Vaping Use  . Vaping Use: Never used  Substance and Sexual Activity  . Alcohol use: No  . Drug use: No  . Sexual activity: Not Currently    Partners: Female    Birth control/protection: None  Other Topics Concern  . Not on file  Social History Narrative   Patient lives in private residence with supportive spouse   Social Determinants of Health   Financial Resource Strain:   . Difficulty of Paying Living Expenses:   Food Insecurity:   . Worried About Programme researcher, broadcasting/film/video in the Last Year:   . Barista in the Last Year:   Transportation Needs:   . Freight forwarder (Medical):   Marland Kitchen Lack of Transportation (Non-Medical):   Physical Activity:   . Days of Exercise per Week:   . Minutes of Exercise per Session:   Stress:    . Feeling of Stress :   Social Connections:   . Frequency of Communication with Friends and Family:   . Frequency of Social Gatherings with Friends and Family:   . Attends Religious Services:   . Active Member of Clubs or Organizations:   . Attends Banker Meetings:   Marland Kitchen Marital Status:   Intimate Partner Violence:   . Fear of Current or Ex-Partner:   . Emotionally Abused:   Marland Kitchen Physically Abused:   . Sexually Abused:     Physical Exam      Future Appointments  Date Time Provider Department Center  12/31/2019  3:30 PM [facility] PA/NP [facility] None  02/29/2020  1:45 PM Rankin, Alford Highland, MD [facility] None  03/15/2020  2:15 PM Helane Gunther, DPM [facility]  BP 126/72   Pulse 68   Temp (!) 97.5 F (36.4 C)   Resp 18   Wt 204 lb (92.5 kg)   SpO2 97%   BMI 31.95 kg/m   Weight yesterday-204 Last visit weight-204  Pt had cataract surgery on his rt eye this morning, feeling fine.  Moms meals is about to run out, it has really helped them out limiting the sodium intake and his weight and they are asking if they can continue it for one more time.  (spoke to [place] and she was able to get him one more month of moms meals)  No missed doses of his meds. Weight staying down. They are going out of town this wknd to [place]-taking a train there.  meds verified and pill box refilled.   Asks-- --charity for rails on steps?? --needs diabetic shoes but they reported he needs an actual MD to sign the order and at his PCP there is not a MD in the office. He is wearing some type of orthopedic boot on both feet, which he trips over them a lot, it looks like something that is used as a short term wear not long term like he is using it for.   [name], [facility] Paramedic  12/16/19

## 2019-12-16 NOTE — Telephone Encounter (Signed)
Community paramedic reached out to request another month of [employer]oms Meals as it has helped pt control his sodium levels and lose weight.  Pt identified as a candidate to receive 7 heart healthy meals per week for 4 weeks through [facility].  Completed referral sent in for review.  Anticipate patient will receive first shipment of food in 1-3 business days.  Burna Sis, LCSW Clinical Social Worker Advanced [facility] Failure Clinic Desk#: [phone] Cell#: [phone]

## 2019-12-23 ENCOUNTER — Other Ambulatory Visit (HOSPITAL_COMMUNITY): Payer: Self-pay

## 2019-12-23 NOTE — Progress Notes (Signed)
Paramedicine Encounter    Patient ID: [name], male    DOB: 21-Jul-1950, 69 y.o.   MRN: [id number]   Patient Care Team: Care, Jinny Blossom [facility] as PCP - General (Family Medicine) Sherren Mocha, MD as PCP - Cardiology (Cardiology) Larey Dresser, MD as PCP - Advanced Heart Failure (Cardiology)  Patient Active Problem List   Diagnosis Date Noted  . Hallux limitus of left foot 06/05/2019  . Hallux limitus of right foot 06/05/2019  . Seasonal allergies 02/12/2019  . Health care maintenance 02/12/2019  . GERD (gastroesophageal reflux disease) 02/12/2019  . Nocturnal hypoxemia 02/12/2019  . OSA (obstructive sleep apnea) 02/12/2019  . Pain due to onychomycosis of toenails of both feet 01/30/2019  . Lobar pneumonia ([place]) 12/29/2018  . Diabetes mellitus type 2, controlled, without complications ([place]) 77/41/2878  . Thoracic aortic aneurysm ([place]) 12/29/2018  . Acute on chronic combined systolic and diastolic CHF (congestive heart failure) ([place]) 12/25/2018  . Syncope 12/25/2018  . Chronic systolic heart failure ([place]) 11/19/2018  . Chronic venous insufficiency 06/24/2018  . Respiratory failure ([place]) 06/01/2018  . Leg swelling 05/06/2018  . Acute respiratory failure with hypoxemia ([place]) 10/14/2017  . Influenza-like illness 10/14/2017  . Delirium 10/14/2017  . Acute urinary retention 10/14/2017  . (HFpEF) heart failure with preserved ejection fraction ([place]), pulmonary edema and LBBB of unknown chronicity  10/29/2016  . Elevated troponin I level, presume demand ischemia  10/29/2016  . AKI (acute kidney injury) ([place]) 10/29/2016  . BPH (benign prostatic hyperplasia) 10/29/2016  . Sepsis ([place]) 10/29/2016  . Bacteremia due to Escherichia coli 10/29/2016  . Fatty liver 10/29/2016  . Acute pulmonary edema (HCC)   . Acute hypoxic and hypercarbic respiratory failure (HCC) in setting of bilateral pulmonary infiltrates. Presume CAP + pulmonary edema +/-ALI 10/27/2016  . Left-sided  weakness 05/04/2015  . TIA (transient ischemic attack) : Rule out. 05/04/2015  . Hyperlipidemia 05/04/2015  . Chest pain 10/10/2013  . SOB (shortness of breath) 10/10/2013  . Hyperglycemia without ketosis 10/17/2011  . HTN (hypertension) 10/17/2011  . Foot drop, left 10/17/2011  . Impacted cerumen of right ear 10/17/2011  . Dyspnea 10/17/2011  . History of TIA (transient ischemic attack) 10/17/2011  . Neuropathy ([place]) 10/17/2011  . Aortic insufficiency 03/16/2011  . Chest pain 03/16/2011    Current Outpatient Medications:  .  Accu-Chek FastClix Lancets MISC, USE UTD, Disp: , Rfl:  .  ACCU-CHEK GUIDE test strip, U TO TEST BLOOD SUGAR QD, Disp: , Rfl:  .  aspirin EC 81 MG tablet, Take 81 mg by mouth daily., Disp: , Rfl:  .  atorvastatin (LIPITOR) 40 MG tablet, Take 1 tablet (40 mg total) by mouth every evening. (Patient taking differently: Take 40 mg by mouth every evening. ), Disp: 30 tablet, Rfl: 11 .  Blood Glucose Monitoring Suppl (ACCU-CHEK GUIDE) w/Device KIT, U TO TEST BLOOD SUGAR QD, Disp: , Rfl:  .  carvedilol (COREG) 12.5 MG tablet, Take 1 tablet (12.5 mg total) by mouth 2 (two) times daily., Disp: 60 tablet, Rfl: 5 .  famotidine (PEPCID) 20 MG tablet, Take 20 mg by mouth at bedtime., Disp: , Rfl:  .  finasteride (PROSCAR) 5 MG tablet, Take 5 mg by mouth daily., Disp: , Rfl:  .  gabapentin (NEURONTIN) 300 MG capsule, Take 300 mg by mouth 4 (four) times daily. , Disp: , Rfl:  .  hydrOXYzine (VISTARIL) 50 MG capsule, Take 50 mg by mouth at bedtime. , Disp: , Rfl:  .  metFORMIN (  GLUCOPHAGE) 1000 MG tablet, Take 1,000 mg by mouth 2 (two) times a day. , Disp: , Rfl:  .  montelukast (SINGULAIR) 10 MG tablet, Take 10 mg by mouth daily. , Disp: , Rfl:  .  Multiple Vitamin (MULTIVITAMIN) tablet, Take 1 tablet by mouth daily., Disp: , Rfl:  .  pantoprazole (PROTONIX) 40 MG tablet, Take 1 tablet (40 mg total) by mouth daily., Disp: 30 tablet, Rfl: 1 .  potassium chloride SA (KLOR-CON) 20  MEQ tablet, Take 20 mEq by mouth daily. , Disp: , Rfl:  .  SYMBICORT 80-4.5 MCG/ACT inhaler, Inhale 2 puffs into the lungs daily as needed. , Disp: , Rfl:  .  tamsulosin (FLOMAX) 0.4 MG CAPS capsule, Take 0.4 mg by mouth daily., Disp: , Rfl:  .  torsemide (DEMADEX) 20 MG tablet, Take 3 tablets (60 mg total) by mouth 2 (two) times daily., Disp: 180 tablet, Rfl: 5 .  VENTOLIN HFA 108 (90 Base) MCG/ACT inhaler, Inhale 2 puffs into the lungs every 6 (six) hours as needed for wheezing or shortness of breath. , Disp: , Rfl:  .  Dextromethorphan HBr (ROBITUSSIN LINGERING COUGHGELS) 15 MG CAPS, Take 15 mg by mouth daily as needed (cough). (Patient not taking: Reported on 12/16/2019), Disp: , Rfl:  Allergies  Allergen Reactions  . Cashew Nut Oil Palpitations  . Percocet [Oxycodone-Acetaminophen] Other (See Comments)    Hallucintaions      Social History   Socioeconomic History  . Marital status: Married    Spouse name: Not on file  . Number of children: Not on file  . Years of education: Not on file  . Highest education level: Not on file  Occupational History  . Not on file  Tobacco Use  . Smoking status: Never Smoker  . Smokeless tobacco: Never Used  Vaping Use  . Vaping Use: Never used  Substance and Sexual Activity  . Alcohol use: No  . Drug use: No  . Sexual activity: Not Currently    Partners: Female    Birth control/protection: None  Other Topics Concern  . Not on file  Social History Narrative   Patient lives in private residence with supportive spouse   Social Determinants of Health   Financial Resource Strain:   . Difficulty of Paying Living Expenses:   Food Insecurity:   . Worried About Charity fundraiser in the Last Year:   . Arboriculturist in the Last Year:   Transportation Needs:   . Film/video editor (Medical):   Marland Kitchen Lack of Transportation (Non-Medical):   Physical Activity:   . Days of Exercise per Week:   . Minutes of Exercise per Session:   Stress:    . Feeling of Stress :   Social Connections:   . Frequency of Communication with Friends and Family:   . Frequency of Social Gatherings with Friends and Family:   . Attends Religious Services:   . Active Member of Clubs or Organizations:   . Attends Archivist Meetings:   Marland Kitchen Marital Status:   Intimate Partner Violence:   . Fear of Current or Ex-Partner:   . Emotionally Abused:   Marland Kitchen Physically Abused:   . Sexually Abused:     Physical Exam      Future Appointments  Date Time Provider [place]  02/23/2020 10:30 AM [facility] PA/NP [facility] None  02/29/2020  1:45 PM Rankin, Clent Demark, MD [facility] None  03/15/2020  2:15 PM Gardiner Barefoot, DPM [facility]  BP 126/70   Pulse 70   Temp 98.6 F (37 C)   Resp 18   Wt 203 lb (92.1 kg)   SpO2 97%   BMI 31.79 kg/m   Weight yesterday-203 Last visit weight-204  Pt reports he is doing well. He went out of town for fathers day to visit [place]. Wife reports he did well.  There was a problem with his 02 refilling device on his 02 concentrator. They will call [facility] to get them back out here.  No missed doses of his meds.  meds verified and pill box refilled.  He denies c/p, no sob, dizziness.  Pt later reported he had diarrhea this morning, he ate cheese early in the morning and that upsets his stomach.   [name], [place] [facility] Paramedic  12/23/19

## 2019-12-25 ENCOUNTER — Telehealth (HOSPITAL_COMMUNITY): Payer: Self-pay | Admitting: Adult Health

## 2019-12-25 NOTE — Telephone Encounter (Signed)
Cardiomems reading elevated.   23 today   Increase torsemide to 80 mg twice a day then go back to 60 mg twice a day.   Discussed low salt choices and limiting fluid intake to < 2 liter per day.    Taras Rask NP-C  4:30 PM

## 2019-12-30 ENCOUNTER — Other Ambulatory Visit (HOSPITAL_COMMUNITY): Payer: Self-pay

## 2019-12-30 NOTE — Progress Notes (Signed)
Paramedicine Encounter    Patient ID: Jayvin Hurrell, male    DOB: 21-Jul-1950, 69 y.o.   MRN: [id number]   Patient Care Team: Care, Jinny Blossom [facility] as PCP - General (Family Medicine) Sherren Mocha, MD as PCP - Cardiology (Cardiology) Larey Dresser, MD as PCP - Advanced Heart Failure (Cardiology)  Patient Active Problem List   Diagnosis Date Noted  . Hallux limitus of left foot 06/05/2019  . Hallux limitus of right foot 06/05/2019  . Seasonal allergies 02/12/2019  . Health care maintenance 02/12/2019  . GERD (gastroesophageal reflux disease) 02/12/2019  . Nocturnal hypoxemia 02/12/2019  . OSA (obstructive sleep apnea) 02/12/2019  . Pain due to onychomycosis of toenails of both feet 01/30/2019  . Lobar pneumonia ([place]) 12/29/2018  . Diabetes mellitus type 2, controlled, without complications ([place]) 77/41/2878  . Thoracic aortic aneurysm ([place]) 12/29/2018  . Acute on chronic combined systolic and diastolic CHF (congestive heart failure) ([place]) 12/25/2018  . Syncope 12/25/2018  . Chronic systolic heart failure ([place]) 11/19/2018  . Chronic venous insufficiency 06/24/2018  . Respiratory failure ([place]) 06/01/2018  . Leg swelling 05/06/2018  . Acute respiratory failure with hypoxemia ([place]) 10/14/2017  . Influenza-like illness 10/14/2017  . Delirium 10/14/2017  . Acute urinary retention 10/14/2017  . (HFpEF) heart failure with preserved ejection fraction ([place]), pulmonary edema and LBBB of unknown chronicity  10/29/2016  . Elevated troponin I level, presume demand ischemia  10/29/2016  . AKI (acute kidney injury) ([place]) 10/29/2016  . BPH (benign prostatic hyperplasia) 10/29/2016  . Sepsis ([place]) 10/29/2016  . Bacteremia due to Escherichia coli 10/29/2016  . Fatty liver 10/29/2016  . Acute pulmonary edema (HCC)   . Acute hypoxic and hypercarbic respiratory failure (HCC) in setting of bilateral pulmonary infiltrates. Presume CAP + pulmonary edema +/-ALI 10/27/2016  . Left-sided  weakness 05/04/2015  . TIA (transient ischemic attack) : Rule out. 05/04/2015  . Hyperlipidemia 05/04/2015  . Chest pain 10/10/2013  . SOB (shortness of breath) 10/10/2013  . Hyperglycemia without ketosis 10/17/2011  . HTN (hypertension) 10/17/2011  . Foot drop, left 10/17/2011  . Impacted cerumen of right ear 10/17/2011  . Dyspnea 10/17/2011  . History of TIA (transient ischemic attack) 10/17/2011  . Neuropathy ([place]) 10/17/2011  . Aortic insufficiency 03/16/2011  . Chest pain 03/16/2011    Current Outpatient Medications:  .  Accu-Chek FastClix Lancets MISC, USE UTD, Disp: , Rfl:  .  ACCU-CHEK GUIDE test strip, U TO TEST BLOOD SUGAR QD, Disp: , Rfl:  .  aspirin EC 81 MG tablet, Take 81 mg by mouth daily., Disp: , Rfl:  .  atorvastatin (LIPITOR) 40 MG tablet, Take 1 tablet (40 mg total) by mouth every evening. (Patient taking differently: Take 40 mg by mouth every evening. ), Disp: 30 tablet, Rfl: 11 .  Blood Glucose Monitoring Suppl (ACCU-CHEK GUIDE) w/Device KIT, U TO TEST BLOOD SUGAR QD, Disp: , Rfl:  .  carvedilol (COREG) 12.5 MG tablet, Take 1 tablet (12.5 mg total) by mouth 2 (two) times daily., Disp: 60 tablet, Rfl: 5 .  famotidine (PEPCID) 20 MG tablet, Take 20 mg by mouth at bedtime., Disp: , Rfl:  .  finasteride (PROSCAR) 5 MG tablet, Take 5 mg by mouth daily., Disp: , Rfl:  .  gabapentin (NEURONTIN) 300 MG capsule, Take 300 mg by mouth 4 (four) times daily. , Disp: , Rfl:  .  hydrOXYzine (VISTARIL) 50 MG capsule, Take 50 mg by mouth at bedtime. , Disp: , Rfl:  .  metFORMIN (  GLUCOPHAGE) 1000 MG tablet, Take 1,000 mg by mouth 2 (two) times a day. , Disp: , Rfl:  .  montelukast (SINGULAIR) 10 MG tablet, Take 10 mg by mouth daily. , Disp: , Rfl:  .  Multiple Vitamin (MULTIVITAMIN) tablet, Take 1 tablet by mouth daily., Disp: , Rfl:  .  pantoprazole (PROTONIX) 40 MG tablet, Take 1 tablet (40 mg total) by mouth daily., Disp: 30 tablet, Rfl: 1 .  potassium chloride SA (KLOR-CON) 20  MEQ tablet, Take 20 mEq by mouth daily. , Disp: , Rfl:  .  SYMBICORT 80-4.5 MCG/ACT inhaler, Inhale 2 puffs into the lungs daily as needed. , Disp: , Rfl:  .  tamsulosin (FLOMAX) 0.4 MG CAPS capsule, Take 0.4 mg by mouth daily., Disp: , Rfl:  .  torsemide (DEMADEX) 20 MG tablet, Take 3 tablets (60 mg total) by mouth 2 (two) times daily., Disp: 180 tablet, Rfl: 5 .  VENTOLIN HFA 108 (90 Base) MCG/ACT inhaler, Inhale 2 puffs into the lungs every 6 (six) hours as needed for wheezing or shortness of breath. , Disp: , Rfl:  .  Dextromethorphan HBr (ROBITUSSIN LINGERING COUGHGELS) 15 MG CAPS, Take 15 mg by mouth daily as needed (cough). (Patient not taking: Reported on 12/16/2019), Disp: , Rfl:  Allergies  Allergen Reactions  . Cashew Nut Oil Palpitations  . Percocet [Oxycodone-Acetaminophen] Other (See Comments)    Hallucintaions      Social History   Socioeconomic History  . Marital status: Married    Spouse name: Not on file  . Number of children: Not on file  . Years of education: Not on file  . Highest education level: Not on file  Occupational History  . Not on file  Tobacco Use  . Smoking status: Never Smoker  . Smokeless tobacco: Never Used  Vaping Use  . Vaping Use: Never used  Substance and Sexual Activity  . Alcohol use: No  . Drug use: No  . Sexual activity: Not Currently    Partners: Female    Birth control/protection: None  Other Topics Concern  . Not on file  Social History Narrative   Patient lives in private residence with supportive spouse   Social Determinants of Health   Financial Resource Strain:   . Difficulty of Paying Living Expenses:   Food Insecurity:   . Worried About Charity fundraiser in the Last Year:   . Arboriculturist in the Last Year:   Transportation Needs:   . Film/video editor (Medical):   Marland Kitchen Lack of Transportation (Non-Medical):   Physical Activity:   . Days of Exercise per Week:   . Minutes of Exercise per Session:   Stress:    . Feeling of Stress :   Social Connections:   . Frequency of Communication with Friends and Family:   . Frequency of Social Gatherings with Friends and Family:   . Attends Religious Services:   . Active Member of Clubs or Organizations:   . Attends Archivist Meetings:   Marland Kitchen Marital Status:   Intimate Partner Violence:   . Fear of Current or Ex-Partner:   . Emotionally Abused:   Marland Kitchen Physically Abused:   . Sexually Abused:     Physical Exam      Future Appointments  Date Time Provider [place]  02/23/2020 10:30 AM [facility] PA/NP [facility] None  02/29/2020  1:45 PM Rankin, Clent Demark, MD RDE-RDE None  03/15/2020  2:15 PM Gardiner Barefoot, DPM [facility]  BP 112/70   Pulse 70   Temp 98.2 F (36.8 C)   Resp 18   Wt 202 lb (91.6 kg)   SpO2 97%   BMI 31.64 kg/m  CBG EMS-160 Weight yesterday-203 Last visit weight-203  Pt reports he is doing well. Last Friday he had a report of his cardiomems of being elevated.  They dont know why b/c they had been trying to limit his salt intake. Pt reports he had a lot of mucous at that time and had to take mucous relief meds but feels better now and thinks that may have caused the cardiomems to be increased. He states he was congested and unable to cough any of it up, but all that has been relieved now. No further issues.  But he did follow the increased torsemide dose.   He denies any sob, no c/p, no dizziness, no edema noted.  meds verified and pill box refilled.    [name], [place] [facility] Paramedic  12/30/19

## 2019-12-31 ENCOUNTER — Encounter (HOSPITAL_COMMUNITY): Payer: Medicare Other

## 2020-01-06 ENCOUNTER — Other Ambulatory Visit (HOSPITAL_COMMUNITY): Payer: Self-pay

## 2020-01-06 NOTE — Progress Notes (Signed)
Paramedicine Encounter    Patient ID: [name], male    DOB: 21-Jul-1950, 69 y.o.   MRN: [id number]   Patient Care Team: Care, Jinny Blossom [facility] as PCP - General (Family Medicine) Sherren Mocha, MD as PCP - Cardiology (Cardiology) Larey Dresser, MD as PCP - Advanced Heart Failure (Cardiology)  Patient Active Problem List   Diagnosis Date Noted  . Hallux limitus of left foot 06/05/2019  . Hallux limitus of right foot 06/05/2019  . Seasonal allergies 02/12/2019  . Health care maintenance 02/12/2019  . GERD (gastroesophageal reflux disease) 02/12/2019  . Nocturnal hypoxemia 02/12/2019  . OSA (obstructive sleep apnea) 02/12/2019  . Pain due to onychomycosis of toenails of both feet 01/30/2019  . Lobar pneumonia ([place]) 12/29/2018  . Diabetes mellitus type 2, controlled, without complications ([place]) 77/41/2878  . Thoracic aortic aneurysm ([place]) 12/29/2018  . Acute on chronic combined systolic and diastolic CHF (congestive heart failure) ([place]) 12/25/2018  . Syncope 12/25/2018  . Chronic systolic heart failure ([place]) 11/19/2018  . Chronic venous insufficiency 06/24/2018  . Respiratory failure ([place]) 06/01/2018  . Leg swelling 05/06/2018  . Acute respiratory failure with hypoxemia ([place]) 10/14/2017  . Influenza-like illness 10/14/2017  . Delirium 10/14/2017  . Acute urinary retention 10/14/2017  . (HFpEF) heart failure with preserved ejection fraction ([place]), pulmonary edema and LBBB of unknown chronicity  10/29/2016  . Elevated troponin I level, presume demand ischemia  10/29/2016  . AKI (acute kidney injury) ([place]) 10/29/2016  . BPH (benign prostatic hyperplasia) 10/29/2016  . Sepsis ([place]) 10/29/2016  . Bacteremia due to Escherichia coli 10/29/2016  . Fatty liver 10/29/2016  . Acute pulmonary edema (HCC)   . Acute hypoxic and hypercarbic respiratory failure (HCC) in setting of bilateral pulmonary infiltrates. Presume CAP + pulmonary edema +/-ALI 10/27/2016  . Left-sided  weakness 05/04/2015  . TIA (transient ischemic attack) : Rule out. 05/04/2015  . Hyperlipidemia 05/04/2015  . Chest pain 10/10/2013  . SOB (shortness of breath) 10/10/2013  . Hyperglycemia without ketosis 10/17/2011  . HTN (hypertension) 10/17/2011  . Foot drop, left 10/17/2011  . Impacted cerumen of right ear 10/17/2011  . Dyspnea 10/17/2011  . History of TIA (transient ischemic attack) 10/17/2011  . Neuropathy ([place]) 10/17/2011  . Aortic insufficiency 03/16/2011  . Chest pain 03/16/2011    Current Outpatient Medications:  .  Accu-Chek FastClix Lancets MISC, USE UTD, Disp: , Rfl:  .  ACCU-CHEK GUIDE test strip, U TO TEST BLOOD SUGAR QD, Disp: , Rfl:  .  aspirin EC 81 MG tablet, Take 81 mg by mouth daily., Disp: , Rfl:  .  atorvastatin (LIPITOR) 40 MG tablet, Take 1 tablet (40 mg total) by mouth every evening. (Patient taking differently: Take 40 mg by mouth every evening. ), Disp: 30 tablet, Rfl: 11 .  Blood Glucose Monitoring Suppl (ACCU-CHEK GUIDE) w/Device KIT, U TO TEST BLOOD SUGAR QD, Disp: , Rfl:  .  carvedilol (COREG) 12.5 MG tablet, Take 1 tablet (12.5 mg total) by mouth 2 (two) times daily., Disp: 60 tablet, Rfl: 5 .  famotidine (PEPCID) 20 MG tablet, Take 20 mg by mouth at bedtime., Disp: , Rfl:  .  finasteride (PROSCAR) 5 MG tablet, Take 5 mg by mouth daily., Disp: , Rfl:  .  gabapentin (NEURONTIN) 300 MG capsule, Take 300 mg by mouth 4 (four) times daily. , Disp: , Rfl:  .  hydrOXYzine (VISTARIL) 50 MG capsule, Take 50 mg by mouth at bedtime. , Disp: , Rfl:  .  metFORMIN (  GLUCOPHAGE) 1000 MG tablet, Take 1,000 mg by mouth 2 (two) times a day. , Disp: , Rfl:  .  montelukast (SINGULAIR) 10 MG tablet, Take 10 mg by mouth daily. , Disp: , Rfl:  .  Multiple Vitamin (MULTIVITAMIN) tablet, Take 1 tablet by mouth daily., Disp: , Rfl:  .  pantoprazole (PROTONIX) 40 MG tablet, Take 1 tablet (40 mg total) by mouth daily., Disp: 30 tablet, Rfl: 1 .  potassium chloride SA (KLOR-CON) 20  MEQ tablet, Take 20 mEq by mouth daily. , Disp: , Rfl:  .  SYMBICORT 80-4.5 MCG/ACT inhaler, Inhale 2 puffs into the lungs daily as needed. , Disp: , Rfl:  .  tamsulosin (FLOMAX) 0.4 MG CAPS capsule, Take 0.4 mg by mouth daily., Disp: , Rfl:  .  torsemide (DEMADEX) 20 MG tablet, Take 3 tablets (60 mg total) by mouth 2 (two) times daily., Disp: 180 tablet, Rfl: 5 .  VENTOLIN HFA 108 (90 Base) MCG/ACT inhaler, Inhale 2 puffs into the lungs every 6 (six) hours as needed for wheezing or shortness of breath. , Disp: , Rfl:  .  Dextromethorphan HBr (ROBITUSSIN LINGERING COUGHGELS) 15 MG CAPS, Take 15 mg by mouth daily as needed (cough). (Patient not taking: Reported on 12/16/2019), Disp: , Rfl:  Allergies  Allergen Reactions  . Cashew Nut Oil Palpitations  . Percocet [Oxycodone-Acetaminophen] Other (See Comments)    Hallucintaions      Social History   Socioeconomic History  . Marital status: Married    Spouse name: Not on file  . Number of children: Not on file  . Years of education: Not on file  . Highest education level: Not on file  Occupational History  . Not on file  Tobacco Use  . Smoking status: Never Smoker  . Smokeless tobacco: Never Used  Vaping Use  . Vaping Use: Never used  Substance and Sexual Activity  . Alcohol use: No  . Drug use: No  . Sexual activity: Not Currently    Partners: Female    Birth control/protection: None  Other Topics Concern  . Not on file  Social History Narrative   Patient lives in private residence with supportive spouse   Social Determinants of Health   Financial Resource Strain:   . Difficulty of Paying Living Expenses:   Food Insecurity:   . Worried About Charity fundraiser in the Last Year:   . Arboriculturist in the Last Year:   Transportation Needs:   . Film/video editor (Medical):   Marland Kitchen Lack of Transportation (Non-Medical):   Physical Activity:   . Days of Exercise per Week:   . Minutes of Exercise per Session:   Stress:    . Feeling of Stress :   Social Connections:   . Frequency of Communication with Friends and Family:   . Frequency of Social Gatherings with Friends and Family:   . Attends Religious Services:   . Active Member of Clubs or Organizations:   . Attends Archivist Meetings:   Marland Kitchen Marital Status:   Intimate Partner Violence:   . Fear of Current or Ex-Partner:   . Emotionally Abused:   Marland Kitchen Physically Abused:   . Sexually Abused:     Physical Exam      Future Appointments  Date Time Provider [place]  02/23/2020 10:30 AM [facility] PA/NP [facility] None  02/29/2020  1:45 PM Rankin, Clent Demark, MD [facility] None  03/15/2020  2:15 PM Gardiner Barefoot, DPM [facility]  BP 122/72   Pulse 68   Temp (!) 96.5 F (35.8 C)   Resp 18   Wt 204 lb (92.5 kg)   SpO2 97%   BMI 31.95 kg/m   Weight yesterday-204 Last visit weight-202  Pt reports he is feeling good. He denies sob, no dizziness, no edema. He has not been wearing his compression stockings in a few days. He has a sore on his leg that is trying to heal up.  No missed doses of his meds. meds verified and pill box refilled.   [name], [place] [facility] Paramedic  01/06/20

## 2020-01-13 ENCOUNTER — Other Ambulatory Visit (HOSPITAL_COMMUNITY): Payer: Self-pay

## 2020-01-13 NOTE — Progress Notes (Signed)
Paramedicine Encounter    Patient ID: [name], male    DOB: 21-Jul-1950, 69 y.o.   MRN: [id number]   Patient Care Team: Care, Jinny Blossom [facility] as PCP - General (Family Medicine) Sherren Mocha, MD as PCP - Cardiology (Cardiology) Larey Dresser, MD as PCP - Advanced Heart Failure (Cardiology)  Patient Active Problem List   Diagnosis Date Noted  . Hallux limitus of left foot 06/05/2019  . Hallux limitus of right foot 06/05/2019  . Seasonal allergies 02/12/2019  . Health care maintenance 02/12/2019  . GERD (gastroesophageal reflux disease) 02/12/2019  . Nocturnal hypoxemia 02/12/2019  . OSA (obstructive sleep apnea) 02/12/2019  . Pain due to onychomycosis of toenails of both feet 01/30/2019  . Lobar pneumonia ([place]) 12/29/2018  . Diabetes mellitus type 2, controlled, without complications ([place]) 77/41/2878  . Thoracic aortic aneurysm ([place]) 12/29/2018  . Acute on chronic combined systolic and diastolic CHF (congestive heart failure) ([place]) 12/25/2018  . Syncope 12/25/2018  . Chronic systolic heart failure ([place]) 11/19/2018  . Chronic venous insufficiency 06/24/2018  . Respiratory failure ([place]) 06/01/2018  . Leg swelling 05/06/2018  . Acute respiratory failure with hypoxemia ([place]) 10/14/2017  . Influenza-like illness 10/14/2017  . Delirium 10/14/2017  . Acute urinary retention 10/14/2017  . (HFpEF) heart failure with preserved ejection fraction ([place]), pulmonary edema and LBBB of unknown chronicity  10/29/2016  . Elevated troponin I level, presume demand ischemia  10/29/2016  . AKI (acute kidney injury) ([place]) 10/29/2016  . BPH (benign prostatic hyperplasia) 10/29/2016  . Sepsis ([place]) 10/29/2016  . Bacteremia due to Escherichia coli 10/29/2016  . Fatty liver 10/29/2016  . Acute pulmonary edema (HCC)   . Acute hypoxic and hypercarbic respiratory failure (HCC) in setting of bilateral pulmonary infiltrates. Presume CAP + pulmonary edema +/-ALI 10/27/2016  . Left-sided  weakness 05/04/2015  . TIA (transient ischemic attack) : Rule out. 05/04/2015  . Hyperlipidemia 05/04/2015  . Chest pain 10/10/2013  . SOB (shortness of breath) 10/10/2013  . Hyperglycemia without ketosis 10/17/2011  . HTN (hypertension) 10/17/2011  . Foot drop, left 10/17/2011  . Impacted cerumen of right ear 10/17/2011  . Dyspnea 10/17/2011  . History of TIA (transient ischemic attack) 10/17/2011  . Neuropathy ([place]) 10/17/2011  . Aortic insufficiency 03/16/2011  . Chest pain 03/16/2011    Current Outpatient Medications:  .  Accu-Chek FastClix Lancets MISC, USE UTD, Disp: , Rfl:  .  ACCU-CHEK GUIDE test strip, U TO TEST BLOOD SUGAR QD, Disp: , Rfl:  .  aspirin EC 81 MG tablet, Take 81 mg by mouth daily., Disp: , Rfl:  .  atorvastatin (LIPITOR) 40 MG tablet, Take 1 tablet (40 mg total) by mouth every evening. (Patient taking differently: Take 40 mg by mouth every evening. ), Disp: 30 tablet, Rfl: 11 .  Blood Glucose Monitoring Suppl (ACCU-CHEK GUIDE) w/Device KIT, U TO TEST BLOOD SUGAR QD, Disp: , Rfl:  .  carvedilol (COREG) 12.5 MG tablet, Take 1 tablet (12.5 mg total) by mouth 2 (two) times daily., Disp: 60 tablet, Rfl: 5 .  famotidine (PEPCID) 20 MG tablet, Take 20 mg by mouth at bedtime., Disp: , Rfl:  .  finasteride (PROSCAR) 5 MG tablet, Take 5 mg by mouth daily., Disp: , Rfl:  .  gabapentin (NEURONTIN) 300 MG capsule, Take 300 mg by mouth 4 (four) times daily. , Disp: , Rfl:  .  hydrOXYzine (VISTARIL) 50 MG capsule, Take 50 mg by mouth at bedtime. , Disp: , Rfl:  .  metFORMIN (  GLUCOPHAGE) 1000 MG tablet, Take 1,000 mg by mouth 2 (two) times a day. , Disp: , Rfl:  .  montelukast (SINGULAIR) 10 MG tablet, Take 10 mg by mouth daily. , Disp: , Rfl:  .  Multiple Vitamin (MULTIVITAMIN) tablet, Take 1 tablet by mouth daily., Disp: , Rfl:  .  pantoprazole (PROTONIX) 40 MG tablet, Take 1 tablet (40 mg total) by mouth daily., Disp: 30 tablet, Rfl: 1 .  potassium chloride SA (KLOR-CON) 20  MEQ tablet, Take 20 mEq by mouth daily. , Disp: , Rfl:  .  SYMBICORT 80-4.5 MCG/ACT inhaler, Inhale 2 puffs into the lungs daily as needed. , Disp: , Rfl:  .  tamsulosin (FLOMAX) 0.4 MG CAPS capsule, Take 0.4 mg by mouth daily., Disp: , Rfl:  .  torsemide (DEMADEX) 20 MG tablet, Take 3 tablets (60 mg total) by mouth 2 (two) times daily., Disp: 180 tablet, Rfl: 5 .  VENTOLIN HFA 108 (90 Base) MCG/ACT inhaler, Inhale 2 puffs into the lungs every 6 (six) hours as needed for wheezing or shortness of breath. , Disp: , Rfl:  .  Dextromethorphan HBr (ROBITUSSIN LINGERING COUGHGELS) 15 MG CAPS, Take 15 mg by mouth daily as needed (cough). (Patient not taking: Reported on 12/16/2019), Disp: , Rfl:  Allergies  Allergen Reactions  . Cashew Nut Oil Palpitations  . Percocet [Oxycodone-Acetaminophen] Other (See Comments)    Hallucintaions      Social History   Socioeconomic History  . Marital status: Married    Spouse name: Not on file  . Number of children: Not on file  . Years of education: Not on file  . Highest education level: Not on file  Occupational History  . Not on file  Tobacco Use  . Smoking status: Never Smoker  . Smokeless tobacco: Never Used  Vaping Use  . Vaping Use: Never used  Substance and Sexual Activity  . Alcohol use: No  . Drug use: No  . Sexual activity: Not Currently    Partners: Female    Birth control/protection: None  Other Topics Concern  . Not on file  Social History Narrative   Patient lives in private residence with supportive spouse   Social Determinants of Health   Financial Resource Strain:   . Difficulty of Paying Living Expenses:   Food Insecurity:   . Worried About Charity fundraiser in the Last Year:   . Arboriculturist in the Last Year:   Transportation Needs:   . Film/video editor (Medical):   Marland Kitchen Lack of Transportation (Non-Medical):   Physical Activity:   . Days of Exercise per Week:   . Minutes of Exercise per Session:   Stress:    . Feeling of Stress :   Social Connections:   . Frequency of Communication with Friends and Family:   . Frequency of Social Gatherings with Friends and Family:   . Attends Religious Services:   . Active Member of Clubs or Organizations:   . Attends Archivist Meetings:   Marland Kitchen Marital Status:   Intimate Partner Violence:   . Fear of Current or Ex-Partner:   . Emotionally Abused:   Marland Kitchen Physically Abused:   . Sexually Abused:     Physical Exam      Future Appointments  Date Time Provider [place]  02/23/2020 10:30 AM [facility] PA/NP [facility] None  02/29/2020  1:45 PM Rankin, Clent Demark, MD [facility] None  03/15/2020  2:15 PM Gardiner Barefoot, DPM [facility]  BP 112/68   Pulse 68   Temp (!) 97.2 F (36.2 C)   Resp 18   Wt 203 lb (92.1 kg)   SpO2 97%   BMI 31.79 kg/m   Weight yesterday-203 Last visit weight-204  Pt reports he has been doing good however last night the 02 had become d/c from the CPAP machine and he has some phelgm this morning but took a plain mucinex and is feeling better.  Pt denies c/p, no dizziness, weight stable. No phone calls from cardiomems readings.  F/u with jenna about assist for hand rails.  He did miss last night dose of meds.  meds verified and pill box refilled.    [name], [facility] Paramedic  01/13/20

## 2020-01-15 IMAGING — DX CHEST - 2 VIEW
2 series · 2 of 2 positions shown · non-contrast
Comparison: 12/25/2018 chest radiograph.

CLINICAL DATA: Follow-up pneumonia

EXAM:
CHEST - 2 VIEW

[chest pa]
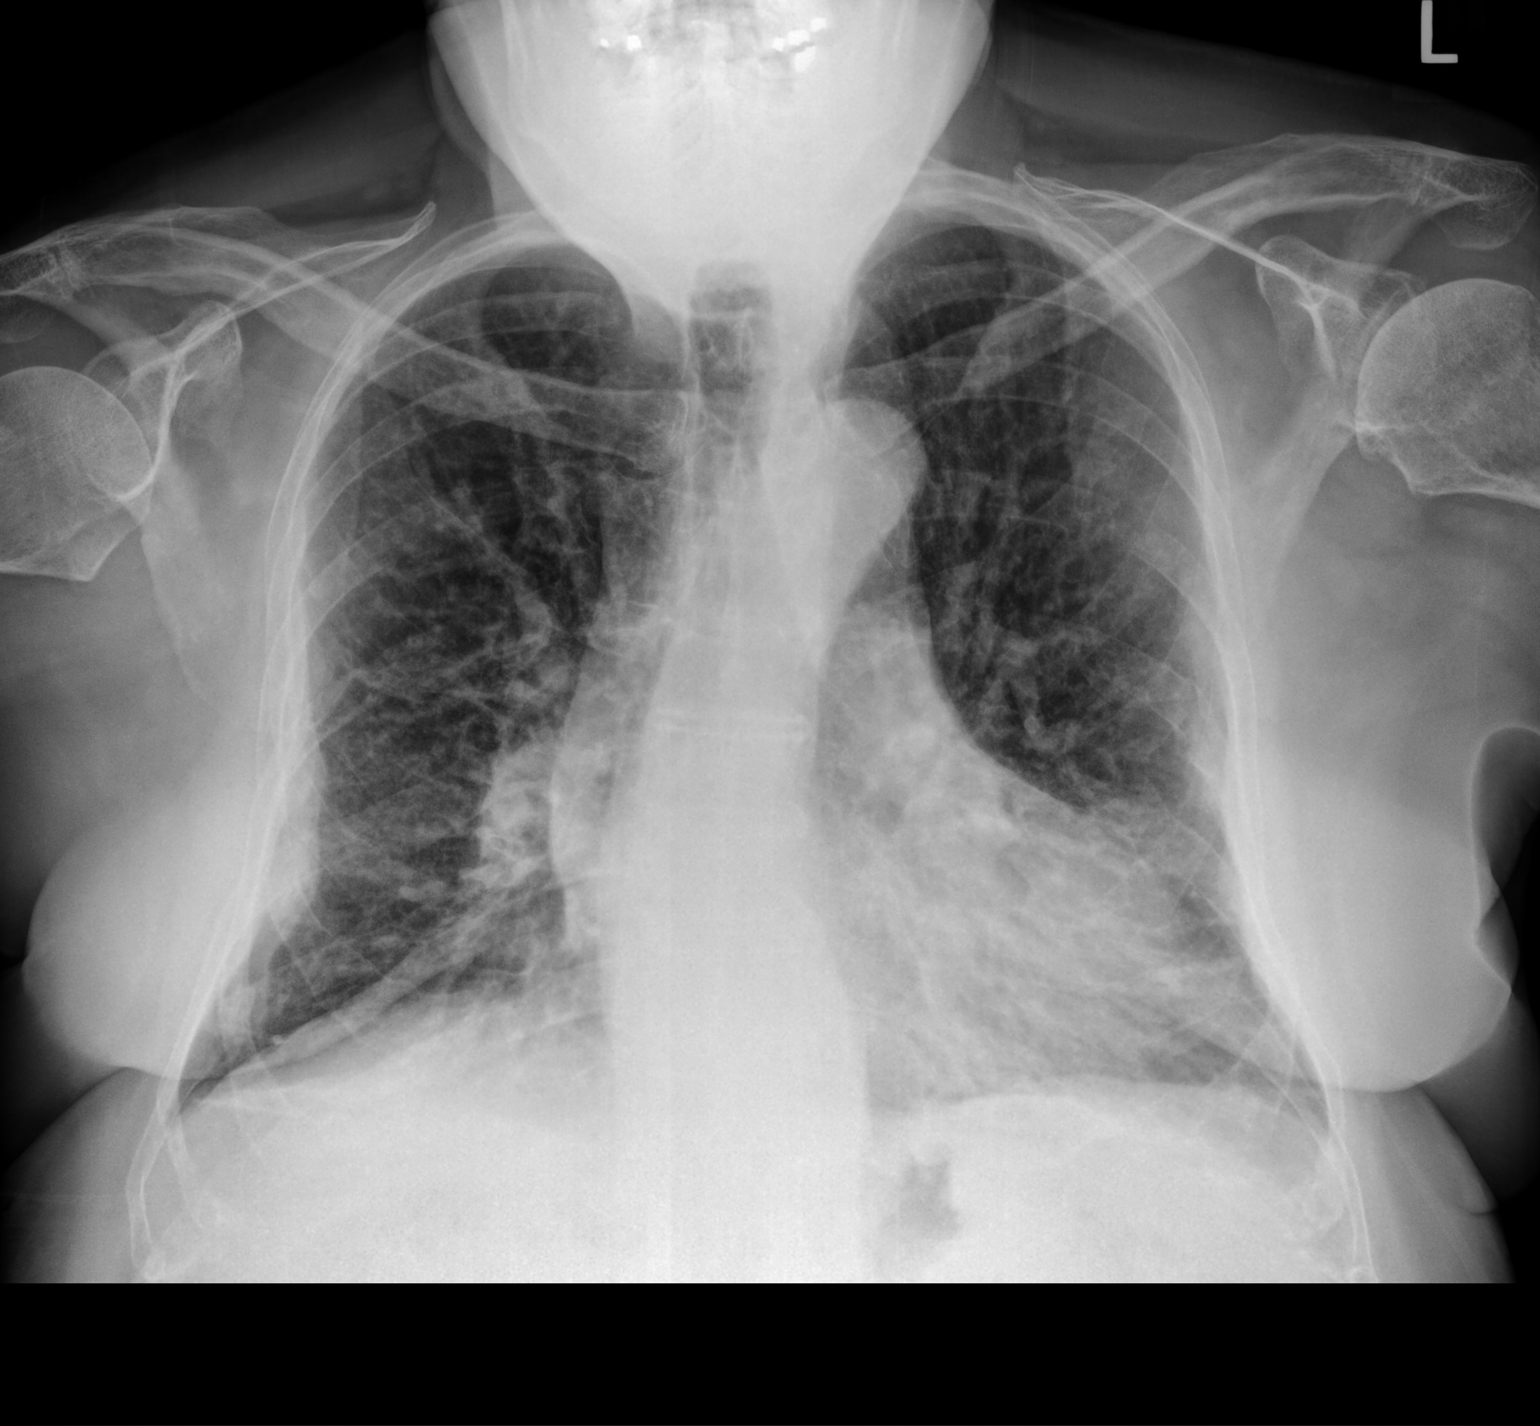

[chest lat]
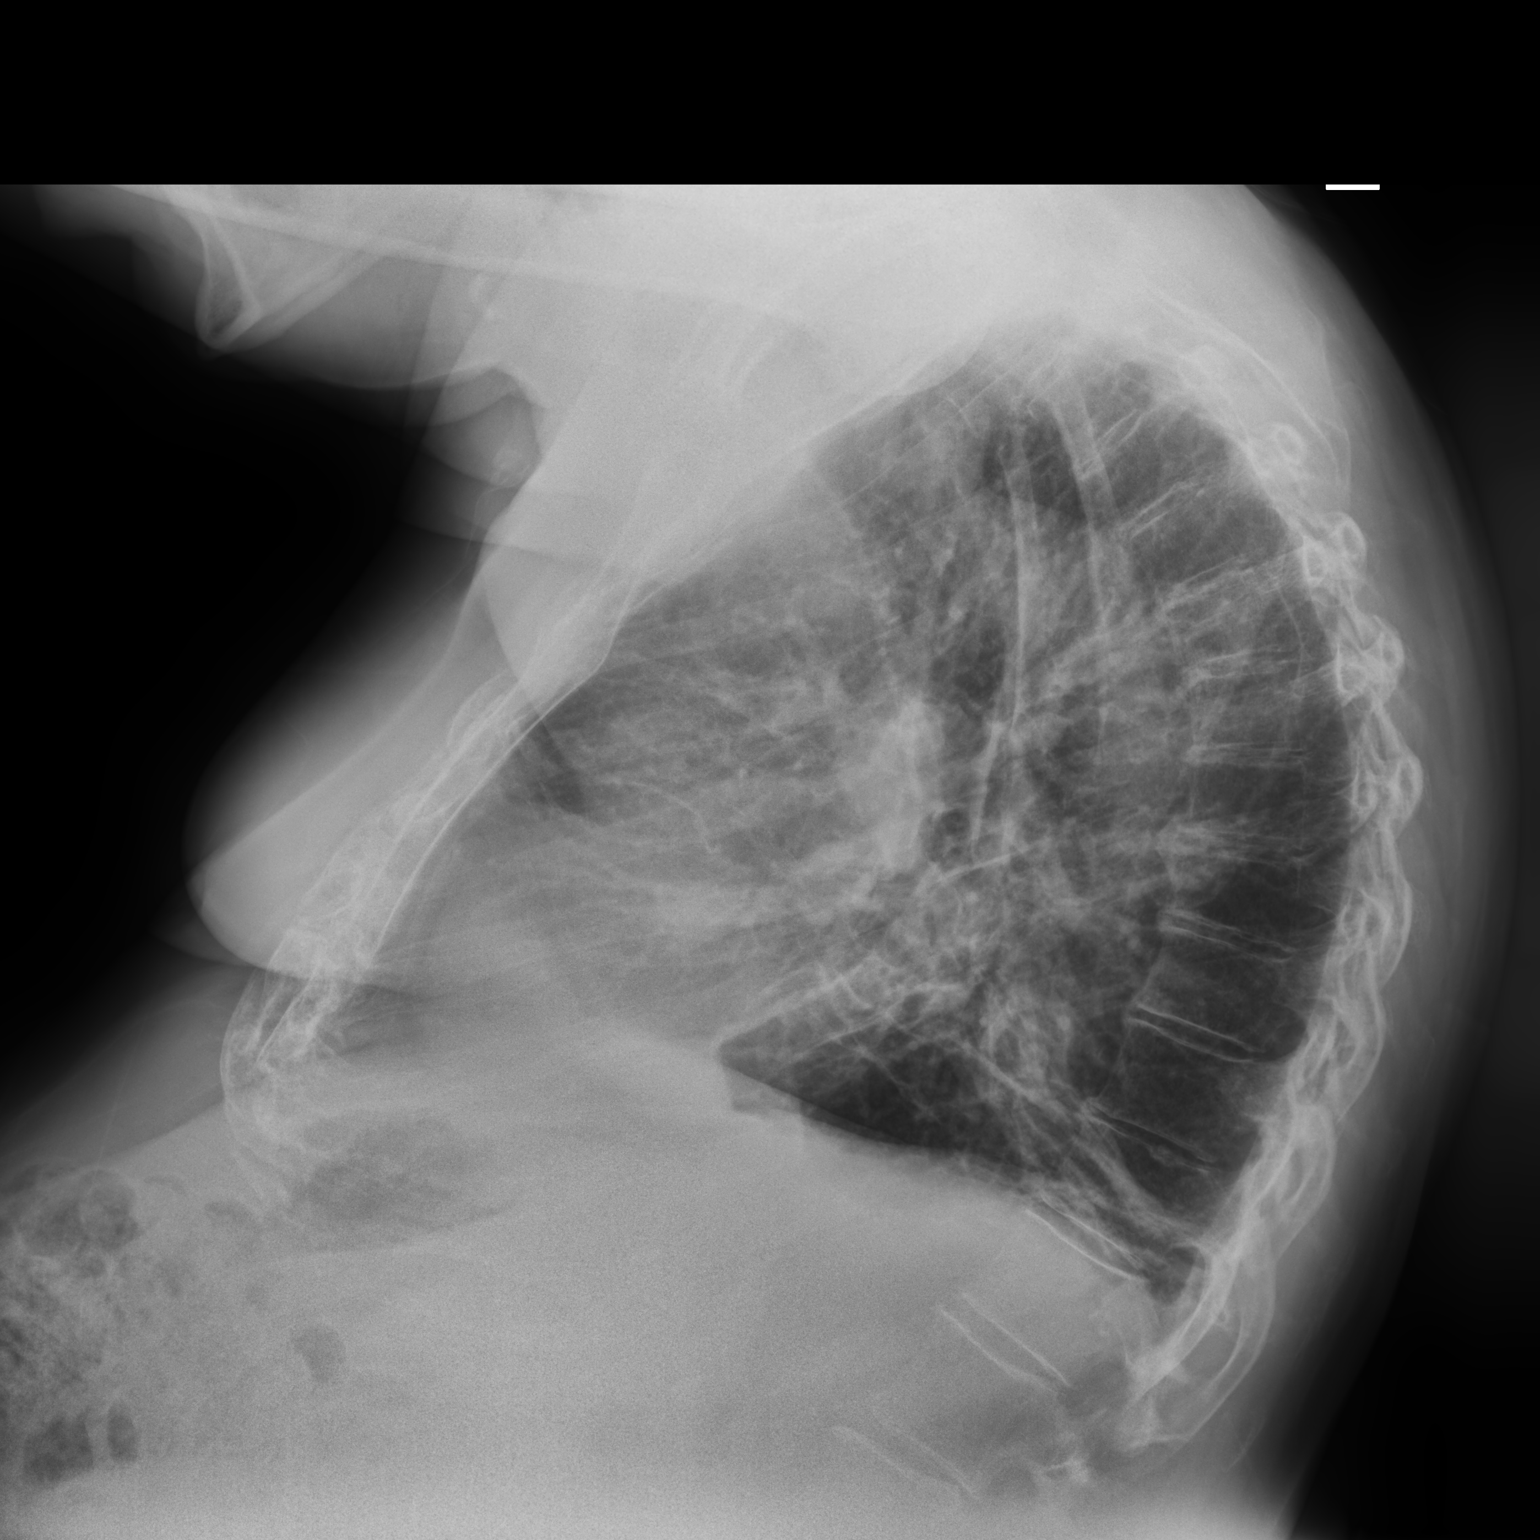

[2 of 2 positions shown; findings below may reference images not displayed]

FINDINGS: Stable cardiomediastinal silhouette with top-normal heart size. No
pneumothorax. No pleural effusion. Resolved consolidative airspace
disease. Mild diffuse prominence of the parahilar interstitial
markings. Mild bibasilar scarring versus atelectasis. Stable
peripheral bilateral pleural thickening, right greater than left.
IMPRESSION: Resolved consolidative airspace disease. Persistent mild diffuse
prominence of the parahilar interstitial markings, favor
cephalization of the pulmonary vasculature, without overt pulmonary
edema.

Mild bibasilar scarring versus atelectasis.

## 2020-01-20 ENCOUNTER — Other Ambulatory Visit (HOSPITAL_COMMUNITY): Payer: Self-pay

## 2020-01-20 NOTE — Progress Notes (Signed)
Paramedicine Encounter ° ° ° Patient ID: [name], male    DOB: 05/24/1951, 69 y.o.   MRN: [id number] ° ° °Patient Care Team: °Care, Evans Blount [facility] as PCP - General (Family Medicine) °Cooper, Michael, MD as PCP - Cardiology (Cardiology) °McLean, Dalton S, MD as PCP - Advanced Heart Failure (Cardiology) ° °Patient Active Problem List  ° Diagnosis Date Noted  °• Hallux limitus of left foot 06/05/2019  °• Hallux limitus of right foot 06/05/2019  °• Seasonal allergies 02/12/2019  °• Health care maintenance 02/12/2019  °• GERD (gastroesophageal reflux disease) 02/12/2019  °• Nocturnal hypoxemia 02/12/2019  °• OSA (obstructive sleep apnea) 02/12/2019  °• Pain due to onychomycosis of toenails of both feet 01/30/2019  °• Lobar pneumonia (HCC) 12/29/2018  °• Diabetes mellitus type 2, controlled, without complications (HCC) 12/29/2018  °• Thoracic aortic aneurysm (HCC) 12/29/2018  °• Acute on chronic combined systolic and diastolic CHF (congestive heart failure) (HCC) 12/25/2018  °• Syncope 12/25/2018  °• Chronic systolic heart failure (HCC) 11/19/2018  °• Chronic venous insufficiency 06/24/2018  °• Respiratory failure (HCC) 06/01/2018  °• Leg swelling 05/06/2018  °• Acute respiratory failure with hypoxemia (HCC) 10/14/2017  °• Influenza-like illness 10/14/2017  °• Delirium 10/14/2017  °• Acute urinary retention 10/14/2017  °• (HFpEF) heart failure with preserved ejection fraction (HCC), pulmonary edema and LBBB of unknown chronicity  10/29/2016  °• Elevated troponin I level, presume demand ischemia  10/29/2016  °• AKI (acute kidney injury) (HCC) 10/29/2016  °• BPH (benign prostatic hyperplasia) 10/29/2016  °• Sepsis (HCC) 10/29/2016  °• Bacteremia due to Escherichia coli 10/29/2016  °• Fatty liver 10/29/2016  °• Acute pulmonary edema (HCC)   °• Acute hypoxic and hypercarbic respiratory failure (HCC) in setting of bilateral pulmonary infiltrates. Presume CAP + pulmonary edema +/-ALI 10/27/2016  °• Left-sided  weakness 05/04/2015  °• TIA (transient ischemic attack) : Rule out. 05/04/2015  °• Hyperlipidemia 05/04/2015  °• Chest pain 10/10/2013  °• SOB (shortness of breath) 10/10/2013  °• Hyperglycemia without ketosis 10/17/2011  °• HTN (hypertension) 10/17/2011  °• Foot drop, left 10/17/2011  °• Impacted cerumen of right ear 10/17/2011  °• Dyspnea 10/17/2011  °• History of TIA (transient ischemic attack) 10/17/2011  °• Neuropathy (HCC) 10/17/2011  °• Aortic insufficiency 03/16/2011  °• Chest pain 03/16/2011  ° ° °Current Outpatient Medications:  °•  Accu-Chek FastClix Lancets MISC, USE UTD, Disp: , Rfl:  °•  ACCU-CHEK GUIDE test strip, U TO TEST BLOOD SUGAR QD, Disp: , Rfl:  °•  aspirin EC 81 MG tablet, Take 81 mg by mouth daily., Disp: , Rfl:  °•  atorvastatin (LIPITOR) 40 MG tablet, Take 1 tablet (40 mg total) by mouth every evening. (Patient taking differently: Take 40 mg by mouth every evening. ), Disp: 30 tablet, Rfl: 11 °•  Blood Glucose Monitoring Suppl (ACCU-CHEK GUIDE) w/Device KIT, U TO TEST BLOOD SUGAR QD, Disp: , Rfl:  °•  carvedilol (COREG) 12.5 MG tablet, Take 1 tablet (12.5 mg total) by mouth 2 (two) times daily., Disp: 60 tablet, Rfl: 5 °•  famotidine (PEPCID) 20 MG tablet, Take 20 mg by mouth at bedtime., Disp: , Rfl:  °•  finasteride (PROSCAR) 5 MG tablet, Take 5 mg by mouth daily., Disp: , Rfl:  °•  gabapentin (NEURONTIN) 300 MG capsule, Take 300 mg by mouth 4 (four) times daily. , Disp: , Rfl:  °•  hydrOXYzine (VISTARIL) 50 MG capsule, Take 50 mg by mouth at bedtime. , Disp: , Rfl:  °•  metFORMIN (  GLUCOPHAGE) 1000 MG tablet, Take 1,000 mg by mouth 2 (two) times a day. , Disp: , Rfl:    montelukast (SINGULAIR) 10 MG tablet, Take 10 mg by mouth daily. , Disp: , Rfl:    Multiple Vitamin (MULTIVITAMIN) tablet, Take 1 tablet by mouth daily., Disp: , Rfl:    pantoprazole (PROTONIX) 40 MG tablet, Take 1 tablet (40 mg total) by mouth daily., Disp: 30 tablet, Rfl: 1   potassium chloride SA (KLOR-CON) 20  MEQ tablet, Take 20 mEq by mouth daily. , Disp: , Rfl:    SYMBICORT 80-4.5 MCG/ACT inhaler, Inhale 2 puffs into the lungs daily as needed. , Disp: , Rfl:    tamsulosin (FLOMAX) 0.4 MG CAPS capsule, Take 0.4 mg by mouth daily., Disp: , Rfl:    torsemide (DEMADEX) 20 MG tablet, Take 3 tablets (60 mg total) by mouth 2 (two) times daily., Disp: 180 tablet, Rfl: 5   VENTOLIN HFA 108 (90 Base) MCG/ACT inhaler, Inhale 2 puffs into the lungs every 6 (six) hours as needed for wheezing or shortness of breath. , Disp: , Rfl:    Dextromethorphan HBr (ROBITUSSIN LINGERING COUGHGELS) 15 MG CAPS, Take 15 mg by mouth daily as needed (cough). (Patient not taking: Reported on 12/16/2019), Disp: , Rfl:  Allergies  Allergen Reactions   Cashew Nut Oil Palpitations   Percocet [Oxycodone-Acetaminophen] Other (See Comments)    Hallucintaions      Social History   Socioeconomic History   Marital status: Married    Spouse name: Not on file   Number of children: Not on file   Years of education: Not on file   Highest education level: Not on file  Occupational History   Not on file  Tobacco Use   Smoking status: Never Smoker   Smokeless tobacco: Never Used  Vaping Use   Vaping Use: Never used  Substance and Sexual Activity   Alcohol use: No   Drug use: No   Sexual activity: Not Currently    Partners: Female    Birth control/protection: None  Other Topics Concern   Not on file  Social History Narrative   Patient lives in private residence with supportive spouse   Social Determinants of Health   Financial Resource Strain:    Difficulty of Paying Living Expenses:   Food Insecurity:    Worried About Charity fundraiser in the Last Year:    Arboriculturist in the Last Year:   Transportation Needs:    Film/video editor (Medical):    Lack of Transportation (Non-Medical):   Physical Activity:    Days of Exercise per Week:    Minutes of Exercise per Session:   Stress:     Feeling of Stress :   Social Connections:    Frequency of Communication with Friends and Family:    Frequency of Social Gatherings with Friends and Family:    Attends Religious Services:    Active Member of Clubs or Organizations:    Attends Archivist Meetings:    Marital Status:   Intimate Partner Violence:    Fear of Current or Ex-Partner:    Emotionally Abused:    Physically Abused:    Sexually Abused:     Physical Exam      Future Appointments  Date Time Provider [place]  02/23/2020 10:30 AM [facility] PA/NP [facility] None  02/29/2020  1:45 PM Rankin, Clent Demark, MD [facility] None  03/15/2020  2:15 PM Gardiner Barefoot, DPM [facility]  BP 122/82    Pulse 74    Resp 18    Wt 199 lb (90.3 kg)    SpO2 97%    BMI 31.17 kg/m   Weight yesterday-202  Last visit weight-203  Pt reports he hasnt felt good past few days. He states it started Sunday while he was eating walnuts, he has not had trouble in the past eating walnuts. No allergy symptoms-no hives/itching/no oral swelling. But he reports it as a dull c/p to the left side of his chest, a pain in his elbow and he has been more sob past few days. The air quality is very poor right now with the [place]. But his sob has improved. No c/p since yesterday and it went away when he took a tylenol. No falls or injuries.   He is not having c/p today.  12 lead done today  No acute changes noted. No changes from his previous ekg.   -potassium, atorvastatin, hydroxyzine, torsemide, needs refilled before I see them in 2 wks. They will tend to that next week.   meds verified and 2 pill boxes refilled-I will be out of office next week.    [name], [facility] Paramedic  01/20/20

## 2020-01-21 ENCOUNTER — Telehealth (HOSPITAL_COMMUNITY): Payer: Self-pay | Admitting: *Deleted

## 2020-01-21 NOTE — Telephone Encounter (Signed)
-----   Message from Kerry Hough, EMT sent at 01/20/2020  5:55 PM EDT ----- Hey girl,   I seen him today and he reported to have had a few episodes of c/p on Sunday and yesterday with increased sob. He states his breathing has improved today. No c/p today.  12 lead done with no changes from his previous EKG.  He has f/u late august in clinic but not sure if you want to try (if possible, I know yalls schedule is tight) to get him in sooner. I am off tomorrow through next week so if you are able to get him in sooner can you call him to let them know?   Told him if it continues/worsens then of course go to ER.    Thank you!  Kerry Hough, EMT-Paramedic  01/20/20

## 2020-02-03 ENCOUNTER — Other Ambulatory Visit (HOSPITAL_COMMUNITY): Payer: Self-pay | Admitting: *Deleted

## 2020-02-03 ENCOUNTER — Other Ambulatory Visit (HOSPITAL_COMMUNITY): Payer: Self-pay

## 2020-02-03 MED ORDER — TORSEMIDE 20 MG PO TABS: 60.0000 mg | ORAL_TABLET | Freq: Two times a day (BID) | ORAL | 3 refills | Status: DC

## 2020-02-03 NOTE — Progress Notes (Signed)
Paramedicine Encounter    Patient ID: [name], male    DOB: 21-Jul-1950, 69 y.o.   MRN: [id number]   Patient Care Team: Care, Jinny Blossom [facility] as PCP - General (Family Medicine) Sherren Mocha, MD as PCP - Cardiology (Cardiology) Larey Dresser, MD as PCP - Advanced Heart Failure (Cardiology)  Patient Active Problem List   Diagnosis Date Noted  . Hallux limitus of left foot 06/05/2019  . Hallux limitus of right foot 06/05/2019  . Seasonal allergies 02/12/2019  . Health care maintenance 02/12/2019  . GERD (gastroesophageal reflux disease) 02/12/2019  . Nocturnal hypoxemia 02/12/2019  . OSA (obstructive sleep apnea) 02/12/2019  . Pain due to onychomycosis of toenails of both feet 01/30/2019  . Lobar pneumonia ([place]) 12/29/2018  . Diabetes mellitus type 2, controlled, without complications ([place]) 77/41/2878  . Thoracic aortic aneurysm ([place]) 12/29/2018  . Acute on chronic combined systolic and diastolic CHF (congestive heart failure) ([place]) 12/25/2018  . Syncope 12/25/2018  . Chronic systolic heart failure ([place]) 11/19/2018  . Chronic venous insufficiency 06/24/2018  . Respiratory failure ([place]) 06/01/2018  . Leg swelling 05/06/2018  . Acute respiratory failure with hypoxemia ([place]) 10/14/2017  . Influenza-like illness 10/14/2017  . Delirium 10/14/2017  . Acute urinary retention 10/14/2017  . (HFpEF) heart failure with preserved ejection fraction ([place]), pulmonary edema and LBBB of unknown chronicity  10/29/2016  . Elevated troponin I level, presume demand ischemia  10/29/2016  . AKI (acute kidney injury) ([place]) 10/29/2016  . BPH (benign prostatic hyperplasia) 10/29/2016  . Sepsis ([place]) 10/29/2016  . Bacteremia due to Escherichia coli 10/29/2016  . Fatty liver 10/29/2016  . Acute pulmonary edema (HCC)   . Acute hypoxic and hypercarbic respiratory failure (HCC) in setting of bilateral pulmonary infiltrates. Presume CAP + pulmonary edema +/-ALI 10/27/2016  . Left-sided  weakness 05/04/2015  . TIA (transient ischemic attack) : Rule out. 05/04/2015  . Hyperlipidemia 05/04/2015  . Chest pain 10/10/2013  . SOB (shortness of breath) 10/10/2013  . Hyperglycemia without ketosis 10/17/2011  . HTN (hypertension) 10/17/2011  . Foot drop, left 10/17/2011  . Impacted cerumen of right ear 10/17/2011  . Dyspnea 10/17/2011  . History of TIA (transient ischemic attack) 10/17/2011  . Neuropathy ([place]) 10/17/2011  . Aortic insufficiency 03/16/2011  . Chest pain 03/16/2011    Current Outpatient Medications:  .  Accu-Chek FastClix Lancets MISC, USE UTD, Disp: , Rfl:  .  ACCU-CHEK GUIDE test strip, U TO TEST BLOOD SUGAR QD, Disp: , Rfl:  .  aspirin EC 81 MG tablet, Take 81 mg by mouth daily., Disp: , Rfl:  .  atorvastatin (LIPITOR) 40 MG tablet, Take 1 tablet (40 mg total) by mouth every evening. (Patient taking differently: Take 40 mg by mouth every evening. ), Disp: 30 tablet, Rfl: 11 .  Blood Glucose Monitoring Suppl (ACCU-CHEK GUIDE) w/Device KIT, U TO TEST BLOOD SUGAR QD, Disp: , Rfl:  .  carvedilol (COREG) 12.5 MG tablet, Take 1 tablet (12.5 mg total) by mouth 2 (two) times daily., Disp: 60 tablet, Rfl: 5 .  famotidine (PEPCID) 20 MG tablet, Take 20 mg by mouth at bedtime., Disp: , Rfl:  .  finasteride (PROSCAR) 5 MG tablet, Take 5 mg by mouth daily., Disp: , Rfl:  .  gabapentin (NEURONTIN) 300 MG capsule, Take 300 mg by mouth 4 (four) times daily. , Disp: , Rfl:  .  hydrOXYzine (VISTARIL) 50 MG capsule, Take 50 mg by mouth at bedtime. , Disp: , Rfl:  .  metFORMIN (  GLUCOPHAGE) 1000 MG tablet, Take 1,000 mg by mouth 2 (two) times a day. , Disp: , Rfl:  .  montelukast (SINGULAIR) 10 MG tablet, Take 10 mg by mouth daily. , Disp: , Rfl:  .  Multiple Vitamin (MULTIVITAMIN) tablet, Take 1 tablet by mouth daily., Disp: , Rfl:  .  pantoprazole (PROTONIX) 40 MG tablet, Take 1 tablet (40 mg total) by mouth daily., Disp: 30 tablet, Rfl: 1 .  potassium chloride SA (KLOR-CON) 20  MEQ tablet, Take 20 mEq by mouth daily. , Disp: , Rfl:  .  SYMBICORT 80-4.5 MCG/ACT inhaler, Inhale 2 puffs into the lungs daily as needed. , Disp: , Rfl:  .  tamsulosin (FLOMAX) 0.4 MG CAPS capsule, Take 0.4 mg by mouth daily., Disp: , Rfl:  .  VENTOLIN HFA 108 (90 Base) MCG/ACT inhaler, Inhale 2 puffs into the lungs every 6 (six) hours as needed for wheezing or shortness of breath. , Disp: , Rfl:  .  Dextromethorphan HBr (ROBITUSSIN LINGERING COUGHGELS) 15 MG CAPS, Take 15 mg by mouth daily as needed (cough). (Patient not taking: Reported on 12/16/2019), Disp: , Rfl:  .  torsemide (DEMADEX) 20 MG tablet, Take 3 tablets (60 mg total) by mouth 2 (two) times daily., Disp: 180 tablet, Rfl: 3 Allergies  Allergen Reactions  . Cashew Nut Oil Palpitations  . Percocet [Oxycodone-Acetaminophen] Other (See Comments)    Hallucintaions      Social History   Socioeconomic History  . Marital status: Married    Spouse name: Not on file  . Number of children: Not on file  . Years of education: Not on file  . Highest education level: Not on file  Occupational History  . Not on file  Tobacco Use  . Smoking status: Never Smoker  . Smokeless tobacco: Never Used  Vaping Use  . Vaping Use: Never used  Substance and Sexual Activity  . Alcohol use: No  . Drug use: No  . Sexual activity: Not Currently    Partners: Female    Birth control/protection: None  Other Topics Concern  . Not on file  Social History Narrative   Patient lives in private residence with supportive spouse   Social Determinants of Health   Financial Resource Strain:   . Difficulty of Paying Living Expenses:   Food Insecurity:   . Worried About Charity fundraiser in the Last Year:   . Arboriculturist in the Last Year:   Transportation Needs:   . Film/video editor (Medical):   Marland Kitchen Lack of Transportation (Non-Medical):   Physical Activity:   . Days of Exercise per Week:   . Minutes of Exercise per Session:   Stress:    . Feeling of Stress :   Social Connections:   . Frequency of Communication with Friends and Family:   . Frequency of Social Gatherings with Friends and Family:   . Attends Religious Services:   . Active Member of Clubs or Organizations:   . Attends Archivist Meetings:   Marland Kitchen Marital Status:   Intimate Partner Violence:   . Fear of Current or Ex-Partner:   . Emotionally Abused:   Marland Kitchen Physically Abused:   . Sexually Abused:     Physical Exam      Future Appointments  Date Time Provider [place]  02/23/2020 10:30 AM [facility] PA/NP [facility] None  02/29/2020  1:45 PM Rankin, Clent Demark, MD [facility] None  03/15/2020  2:15 PM Gardiner Barefoot, DPM [facility]  BP 112/70   Pulse 70   Temp (!) 97.5 F (36.4 C)   Resp 18   Wt 203 lb (92.1 kg)   SpO2 96%   BMI 31.79 kg/m   Weight yesterday-204 Last visit weight-199  Pt reports he is doing ok. His wife reports that he had a couple of episodes of feeling sob. He states he is fine, occasionally gets sob when he walks too far. But cant really explain the circumstances around his sob last week his wife reported.  He has not had any more of those c/p.  His weight is up 4-5 lbs from 2 wks ago but he reported cardiomems reading was done yesterday and got a call stating everything is good.  He is out of the torsemide-has only enough for about 2 days--the pharmacy does not have the most recent dose on file- I will call clinic to get them to send over correct instructions.  No missed doses of his meds. meds verified and pill box refilled.  For right now they want to continue with weekly visits, will think about every other week visits down the road.   [name], [facility] Paramedic  02/03/20

## 2020-02-10 ENCOUNTER — Other Ambulatory Visit (HOSPITAL_COMMUNITY): Payer: Self-pay

## 2020-02-10 NOTE — Progress Notes (Signed)
Paramedicine Encounter ° ° ° Patient ID: [name], male    DOB: 11/28/1950, 69 y.o.   MRN: [id number] ° ° °Patient Care Team: °Care, Evans Blount Total Access as PCP - General (Family Medicine) °Cooper, Michael, MD as PCP - Cardiology (Cardiology) °McLean, Dalton S, MD as PCP - Advanced Heart Failure (Cardiology) ° °Patient Active Problem List  ° Diagnosis Date Noted  °• Hallux limitus of left foot 06/05/2019  °• Hallux limitus of right foot 06/05/2019  °• Seasonal allergies 02/12/2019  °• Health care maintenance 02/12/2019  °• GERD (gastroesophageal reflux disease) 02/12/2019  °• Nocturnal hypoxemia 02/12/2019  °• OSA (obstructive sleep apnea) 02/12/2019  °• Pain due to onychomycosis of toenails of both feet 01/30/2019  °• Lobar pneumonia (HCC) 12/29/2018  °• Diabetes mellitus type 2, controlled, without complications (HCC) 12/29/2018  °• Thoracic aortic aneurysm (HCC) 12/29/2018  °• Acute on chronic combined systolic and diastolic CHF (congestive heart failure) (HCC) 12/25/2018  °• Syncope 12/25/2018  °• Chronic systolic heart failure (HCC) 11/19/2018  °• Chronic venous insufficiency 06/24/2018  °• Respiratory failure (HCC) 06/01/2018  °• Leg swelling 05/06/2018  °• Acute respiratory failure with hypoxemia (HCC) 10/14/2017  °• Influenza-like illness 10/14/2017  °• Delirium 10/14/2017  °• Acute urinary retention 10/14/2017  °• (HFpEF) heart failure with preserved ejection fraction (HCC), pulmonary edema and LBBB of unknown chronicity  10/29/2016  °• Elevated troponin I level, presume demand ischemia  10/29/2016  °• AKI (acute kidney injury) (HCC) 10/29/2016  °• BPH (benign prostatic hyperplasia) 10/29/2016  °• Sepsis (HCC) 10/29/2016  °• Bacteremia due to Escherichia coli 10/29/2016  °• Fatty liver 10/29/2016  °• Acute pulmonary edema (HCC)   °• Acute hypoxic and hypercarbic respiratory failure (HCC) in setting of bilateral pulmonary infiltrates. Presume CAP + pulmonary edema +/-ALI 10/27/2016  °• Left-sided  weakness 05/04/2015  °• TIA (transient ischemic attack) : Rule out. 05/04/2015  °• Hyperlipidemia 05/04/2015  °• Chest pain 10/10/2013  °• SOB (shortness of breath) 10/10/2013  °• Hyperglycemia without ketosis 10/17/2011  °• HTN (hypertension) 10/17/2011  °• Foot drop, left 10/17/2011  °• Impacted cerumen of right ear 10/17/2011  °• Dyspnea 10/17/2011  °• History of TIA (transient ischemic attack) 10/17/2011  °• Neuropathy (HCC) 10/17/2011  °• Aortic insufficiency 03/16/2011  °• Chest pain 03/16/2011  ° ° °Current Outpatient Medications:  °•  Accu-Chek FastClix Lancets MISC, USE UTD, Disp: , Rfl:  °•  ACCU-CHEK GUIDE test strip, U TO TEST BLOOD SUGAR QD, Disp: , Rfl:  °•  aspirin EC 81 MG tablet, Take 81 mg by mouth daily., Disp: , Rfl:  °•  atorvastatin (LIPITOR) 40 MG tablet, Take 1 tablet (40 mg total) by mouth every evening. (Patient taking differently: Take 40 mg by mouth every evening. ), Disp: 30 tablet, Rfl: 11 °•  Blood Glucose Monitoring Suppl (ACCU-CHEK GUIDE) w/Device KIT, U TO TEST BLOOD SUGAR QD, Disp: , Rfl:  °•  carvedilol (COREG) 12.5 MG tablet, Take 1 tablet (12.5 mg total) by mouth 2 (two) times daily., Disp: 60 tablet, Rfl: 5 °•  famotidine (PEPCID) 20 MG tablet, Take 20 mg by mouth at bedtime., Disp: , Rfl:  °•  finasteride (PROSCAR) 5 MG tablet, Take 5 mg by mouth daily., Disp: , Rfl:  °•  gabapentin (NEURONTIN) 300 MG capsule, Take 300 mg by mouth 4 (four) times daily. , Disp: , Rfl:  °•  hydrOXYzine (VISTARIL) 50 MG capsule, Take 50 mg by mouth at bedtime. , Disp: , Rfl:  °•  metFORMIN (  GLUCOPHAGE) 1000 MG tablet, Take 1,000 mg by mouth 2 (two) times a day. , Disp: , Rfl:  °•  montelukast (SINGULAIR) 10 MG tablet, Take 10 mg by mouth daily. , Disp: , Rfl:  °•  Multiple Vitamin (MULTIVITAMIN) tablet, Take 1 tablet by mouth daily., Disp: , Rfl:  °•  pantoprazole (PROTONIX) 40 MG tablet, Take 1 tablet (40 mg total) by mouth daily., Disp: 30 tablet, Rfl: 1 °•  potassium chloride SA (KLOR-CON) 20  MEQ tablet, Take 20 mEq by mouth daily. , Disp: , Rfl:  °•  SYMBICORT 80-4.5 MCG/ACT inhaler, Inhale 2 puffs into the lungs daily as needed. , Disp: , Rfl:  °•  tamsulosin (FLOMAX) 0.4 MG CAPS capsule, Take 0.4 mg by mouth daily., Disp: , Rfl:  °•  torsemide (DEMADEX) 20 MG tablet, Take 3 tablets (60 mg total) by mouth 2 (two) times daily., Disp: 180 tablet, Rfl: 3 °•  VENTOLIN HFA 108 (90 Base) MCG/ACT inhaler, Inhale 2 puffs into the lungs every 6 (six) hours as needed for wheezing or shortness of breath. , Disp: , Rfl:  °•  Dextromethorphan HBr (ROBITUSSIN LINGERING COUGHGELS) 15 MG CAPS, Take 15 mg by mouth daily as needed (cough). (Patient not taking: Reported on 12/16/2019), Disp: , Rfl:  °Allergies  °Allergen Reactions  °• Cashew Nut Oil Palpitations  °• Percocet [Oxycodone-Acetaminophen] Other (See Comments)  °  Hallucintaions  ° ° ° ° °Social History  ° °Socioeconomic History  °• Marital status: Married  °  Spouse name: Not on file  °• Number of children: Not on file  °• Years of education: Not on file  °• Highest education level: Not on file  °Occupational History  °• Not on file  °Tobacco Use  °• Smoking status: Never Smoker  °• Smokeless tobacco: Never Used  °Vaping Use  °• Vaping Use: Never used  °Substance and Sexual Activity  °• Alcohol use: No  °• Drug use: No  °• Sexual activity: Not Currently  °  Partners: Female  °  Birth control/protection: None  °Other Topics Concern  °• Not on file  °Social History Narrative  ° Patient lives in private residence with supportive spouse  ° °Social Determinants of Health  ° °Financial Resource Strain:   °• Difficulty of Paying Living Expenses:   °Food Insecurity:   °• Worried About Running Out of Food in the Last Year:   °• Ran Out of Food in the Last Year:   °Transportation Needs:   °• Lack of Transportation (Medical):   °• Lack of Transportation (Non-Medical):   °Physical Activity:   °• Days of Exercise per Week:   °• Minutes of Exercise per Session:   °Stress:    °• Feeling of Stress :   °Social Connections:   °• Frequency of Communication with Friends and Family:   °• Frequency of Social Gatherings with Friends and Family:   °• Attends Religious Services:   °• Active Member of Clubs or Organizations:   °• Attends Club or Organization Meetings:   °• Marital Status:   °Intimate Partner Violence:   °• Fear of Current or Ex-Partner:   °• Emotionally Abused:   °• Physically Abused:   °• Sexually Abused:   ° ° °Physical Exam ° ° ° ° ° °Future Appointments  °Date Time Provider Department Center  °02/23/2020 10:30 AM [facility] PA/NP [facility] None  °03/15/2020  2:15 PM Mayer, Gregory, DPM [facility]  ° ° °BP 118/70    Pulse 70      Temp (!) 97.5 F (36.4 C)    Resp 20    Wt 204 lb (92.5 kg)    SpO2 98%    BMI 31.95 kg/m   Weight yesterday-201 Last visit weight-203  Pt reports he is doing ok. He states he ran out of torsemide yesterday for his PM dose but he did have the AM dose yesterday.  We called it in last week but the pharmacy had a shortage of the torsemide and wife picked it up today. His weight is up. He did not get on cardiomems yet this morning b/c he knew his weight is up. He took his meds this morning and plan on getting on cardiomems later today.  He states he feels a tad increased sob, his abd is distended slightly. I advised him to be very watchful about what he is eating and how much he is drinking.  He is wearing the compression wraps that his wife puts on for him, no swelling noted to legs.  Spoke to [place] at clinic and she consulted with [place] and for right now no changes and for him to resume his normal dosing and monitor his weight and how he is feeling. If he feels worse or his weight does not improve then he will call clinic or myself to report that.   [name], [facility] Paramedic  02/10/20

## 2020-02-17 ENCOUNTER — Other Ambulatory Visit (HOSPITAL_COMMUNITY): Payer: Self-pay

## 2020-02-17 NOTE — Progress Notes (Signed)
Paramedicine Encounter ° ° ° Patient ID: [name], male    DOB: 04/03/1951, 69 y.o.   MRN: [id number] ° ° °Patient Care Team: °Care, Evans Blount Total Access as PCP - General (Family Medicine) °Cooper, Michael, MD as PCP - Cardiology (Cardiology) °McLean, Dalton S, MD as PCP - Advanced Heart Failure (Cardiology) ° °Patient Active Problem List  ° Diagnosis Date Noted  °• Hallux limitus of left foot 06/05/2019  °• Hallux limitus of right foot 06/05/2019  °• Seasonal allergies 02/12/2019  °• Health care maintenance 02/12/2019  °• GERD (gastroesophageal reflux disease) 02/12/2019  °• Nocturnal hypoxemia 02/12/2019  °• OSA (obstructive sleep apnea) 02/12/2019  °• Pain due to onychomycosis of toenails of both feet 01/30/2019  °• Lobar pneumonia (HCC) 12/29/2018  °• Diabetes mellitus type 2, controlled, without complications (HCC) 12/29/2018  °• Thoracic aortic aneurysm (HCC) 12/29/2018  °• Acute on chronic combined systolic and diastolic CHF (congestive heart failure) (HCC) 12/25/2018  °• Syncope 12/25/2018  °• Chronic systolic heart failure (HCC) 11/19/2018  °• Chronic venous insufficiency 06/24/2018  °• Respiratory failure (HCC) 06/01/2018  °• Leg swelling 05/06/2018  °• Acute respiratory failure with hypoxemia (HCC) 10/14/2017  °• Influenza-like illness 10/14/2017  °• Delirium 10/14/2017  °• Acute urinary retention 10/14/2017  °• (HFpEF) heart failure with preserved ejection fraction (HCC), pulmonary edema and LBBB of unknown chronicity  10/29/2016  °• Elevated troponin I level, presume demand ischemia  10/29/2016  °• AKI (acute kidney injury) (HCC) 10/29/2016  °• BPH (benign prostatic hyperplasia) 10/29/2016  °• Sepsis (HCC) 10/29/2016  °• Bacteremia due to Escherichia coli 10/29/2016  °• Fatty liver 10/29/2016  °• Acute pulmonary edema (HCC)   °• Acute hypoxic and hypercarbic respiratory failure (HCC) in setting of bilateral pulmonary infiltrates. Presume CAP + pulmonary edema +/-ALI 10/27/2016  °• Left-sided  weakness 05/04/2015  °• TIA (transient ischemic attack) : Rule out. 05/04/2015  °• Hyperlipidemia 05/04/2015  °• Chest pain 10/10/2013  °• SOB (shortness of breath) 10/10/2013  °• Hyperglycemia without ketosis 10/17/2011  °• HTN (hypertension) 10/17/2011  °• Foot drop, left 10/17/2011  °• Impacted cerumen of right ear 10/17/2011  °• Dyspnea 10/17/2011  °• History of TIA (transient ischemic attack) 10/17/2011  °• Neuropathy (HCC) 10/17/2011  °• Aortic insufficiency 03/16/2011  °• Chest pain 03/16/2011  ° ° °Current Outpatient Medications:  °•  Accu-Chek FastClix Lancets MISC, USE UTD, Disp: , Rfl:  °•  ACCU-CHEK GUIDE test strip, U TO TEST BLOOD SUGAR QD, Disp: , Rfl:  °•  aspirin EC 81 MG tablet, Take 81 mg by mouth daily., Disp: , Rfl:  °•  atorvastatin (LIPITOR) 40 MG tablet, Take 1 tablet (40 mg total) by mouth every evening. (Patient taking differently: Take 40 mg by mouth every evening. ), Disp: 30 tablet, Rfl: 11 °•  Blood Glucose Monitoring Suppl (ACCU-CHEK GUIDE) w/Device KIT, U TO TEST BLOOD SUGAR QD, Disp: , Rfl:  °•  carvedilol (COREG) 12.5 MG tablet, Take 1 tablet (12.5 mg total) by mouth 2 (two) times daily., Disp: 60 tablet, Rfl: 5 °•  famotidine (PEPCID) 20 MG tablet, Take 20 mg by mouth at bedtime., Disp: , Rfl:  °•  finasteride (PROSCAR) 5 MG tablet, Take 5 mg by mouth daily., Disp: , Rfl:  °•  gabapentin (NEURONTIN) 300 MG capsule, Take 300 mg by mouth 4 (four) times daily. , Disp: , Rfl:  °•  hydrOXYzine (VISTARIL) 50 MG capsule, Take 50 mg by mouth at bedtime. , Disp: , Rfl:  °•  metFORMIN (  GLUCOPHAGE) 1000 MG tablet, Take 1,000 mg by mouth 2 (two) times a day. , Disp: , Rfl:    montelukast (SINGULAIR) 10 MG tablet, Take 10 mg by mouth daily. , Disp: , Rfl:    Multiple Vitamin (MULTIVITAMIN) tablet, Take 1 tablet by mouth daily., Disp: , Rfl:    pantoprazole (PROTONIX) 40 MG tablet, Take 1 tablet (40 mg total) by mouth daily., Disp: 30 tablet, Rfl: 1   potassium chloride SA (KLOR-CON) 20  MEQ tablet, Take 20 mEq by mouth daily. , Disp: , Rfl:    SYMBICORT 80-4.5 MCG/ACT inhaler, Inhale 2 puffs into the lungs daily as needed. , Disp: , Rfl:    tamsulosin (FLOMAX) 0.4 MG CAPS capsule, Take 0.4 mg by mouth daily., Disp: , Rfl:    torsemide (DEMADEX) 20 MG tablet, Take 3 tablets (60 mg total) by mouth 2 (two) times daily., Disp: 180 tablet, Rfl: 3   VENTOLIN HFA 108 (90 Base) MCG/ACT inhaler, Inhale 2 puffs into the lungs every 6 (six) hours as needed for wheezing or shortness of breath. , Disp: , Rfl:    Dextromethorphan HBr (ROBITUSSIN LINGERING COUGHGELS) 15 MG CAPS, Take 15 mg by mouth daily as needed (cough). (Patient not taking: Reported on 12/16/2019), Disp: , Rfl:  Allergies  Allergen Reactions   Cashew Nut Oil Palpitations   Percocet [Oxycodone-Acetaminophen] Other (See Comments)    Hallucintaions      Social History   Socioeconomic History   Marital status: Married    Spouse name: Not on file   Number of children: Not on file   Years of education: Not on file   Highest education level: Not on file  Occupational History   Not on file  Tobacco Use   Smoking status: Never Smoker   Smokeless tobacco: Never Used  Vaping Use   Vaping Use: Never used  Substance and Sexual Activity   Alcohol use: No   Drug use: No   Sexual activity: Not Currently    Partners: Female    Birth control/protection: None  Other Topics Concern   Not on file  Social History Narrative   Patient lives in private residence with supportive spouse   Social Determinants of Health   Financial Resource Strain:    Difficulty of Paying Living Expenses:   Food Insecurity:    Worried About Charity fundraiser in the Last Year:    Arboriculturist in the Last Year:   Transportation Needs:    Film/video editor (Medical):    Lack of Transportation (Non-Medical):   Physical Activity:    Days of Exercise per Week:    Minutes of Exercise per Session:   Stress:     Feeling of Stress :   Social Connections:    Frequency of Communication with Friends and Family:    Frequency of Social Gatherings with Friends and Family:    Attends Religious Services:    Active Member of Clubs or Organizations:    Attends Archivist Meetings:    Marital Status:   Intimate Partner Violence:    Fear of Current or Ex-Partner:    Emotionally Abused:    Physically Abused:    Sexually Abused:     Physical Exam      Future Appointments  Date Time Provider [place]  02/23/2020 10:30 AM [facility] PA/NP [facility] None  03/15/2020  2:15 PM Gardiner Barefoot, DPM [facility]    BP 118/70    Pulse 68  Temp 97.6 F (36.4 C)    Resp 18    Wt 203 lb (92.1 kg)    SpO2 96%    BMI 31.79 kg/m   Weight yesterday-202 Last visit weight-204  Pt reports he is doing ok. He states his rt leg has been hurting him, stinging, burning, and it is more reddened. Wife states she will call wound clinic to get him in to be seen there. He states he has felt more tired. I warned him that if he is getting an infection then that could cause sluggish/tiredness feeling.  He felt sob yesterday when he got on the cardiomems.   Refilled his gabapentin, metformin-gabapentin is out of refills--he said he has to go PCP for f/u as the pharmacy hasnt heard back from doctor yet.   meds verified and pill box refilled.  He is out of gabapentin for Wednesday am/afternoon/even dose but has it for tonight.   Will f/u next week.   [name], [place] [facility] Paramedic  02/17/20

## 2020-02-22 NOTE — Progress Notes (Signed)
Advanced Heart Failure Clinic Note  HF Cardiology: Dr. Aundra Dubin  Reason for Visit: Follow up for Diastolic Heart Failure   69 y.o. with history of CHF, [name], [name] for followup of CHF.  He has a history of mild LV systolic dysfunction (EF 37-10% range) with probably significant RV failure.  Patient was admitted in 4/20 with hypoxic respiratory failure due to PNA and decompensated HF.  Hospitalization was complicated by PEA arrest and AKI.   In 6/20, he was again admitted with hypoxemic respiratory failure with hypertensive crisis, CHF, and PNA.    Due to frequent hospital admission for CHF, he recently underwent Cardiomems implantation, done in 11/20.  RHC at that time showed optimized filling pressures after adjustment of torsemide. He was also recently set up for outpatient sleep study evaluation for fatigue, snoring and excessive daytime sleepiness. He was found to have severe OSA and is now using Bipap. He was also referred to HF paramedicine program.   Today he returns for HF follow up.Overall feeling fine. Active with wife volunteering. Denies SOB/PND/Orthopnea. Using BiPAP every night.  Appetite ok. No fever or chills. Weight at home 203  pounds. Taking all medications. Followed by HF Paramedicine for medications management.   PAD (Cardiomems) 54mmHg --> Todays reading is 22 mmhg.   Labs (4/20): LDL 91 Labs (9/20): K 4, creatinine 1.95 => 2.1 Labs (10/20): K 4.3, creatinine 1.8 Labs (11/20): K 4.9, creatinine 1.73 Labs (2/21): K 4.1, creatinine 1.9 Labs (3/21): K 3.9, creatinine 1.75  PMH: 1. HTN 2. [name] stage 3 3. Type 2 diabetes 4. Hyperlipidemia 5. COPD: Suspected 6. PNA: Recurrent, suspected aspiration.  7. PEA arrest 4/20: Respiratory arrest.  8. LBBB 9. TIA 10. Ascending aortic aneurysm: CT chest in 6/20 with 5.1 cm ascending aortic aneurysm. 11. Chronic primarily diastolic CHF:  - LHC (6/26): Nonobstructive CAD.  - Echo (12/19): EF 45-50%.  - Echo  (4/20): EF 45-50%, diffuse hypokinesis, normal RV.  - Cardiomems placement (11/20). - RHC (11/20): mean RA 2, PA 33/8, mean PCWP 11, CI 3.32 12. Ventricular ectopy: 8/20 monitor showed PVCs and NSVT.  13. Peripheral arterial dopplers (10/20): No significant stenosis.   14. OSA: Bipap  Social History   Socioeconomic History  . Marital status: Married    Spouse name: Not on file  . Number of children: Not on file  . Years of education: Not on file  . Highest education level: Not on file  Occupational History  . Not on file  Tobacco Use  . Smoking status: Never Smoker  . Smokeless tobacco: Never Used  Vaping Use  . Vaping Use: Never used  Substance and Sexual Activity  . Alcohol use: No  . Drug use: No  . Sexual activity: Not Currently    Partners: Female    Birth control/protection: None  Other Topics Concern  . Not on file  Social History Narrative   Patient lives in private residence with supportive spouse   Social Determinants of Health   Financial Resource Strain:   . Difficulty of Paying Living Expenses: Not on file  Food Insecurity:   . Worried About Charity fundraiser in the Last Year: Not on file  . Ran Out of Food in the Last Year: Not on file  Transportation Needs:   . Lack of Transportation (Medical): Not on file  . Lack of Transportation (Non-Medical): Not on file  Physical Activity:   . Days of Exercise per Week: Not on file  .  Minutes of Exercise per Session: Not on file  Stress:   . Feeling of Stress : Not on file  Social Connections:   . Frequency of Communication with Friends and Family: Not on file  . Frequency of Social Gatherings with Friends and Family: Not on file  . Attends Religious Services: Not on file  . Active Member of Clubs or Organizations: Not on file  . Attends Archivist Meetings: Not on file  . Marital Status: Not on file  Intimate Partner Violence:   . Fear of Current or Ex-Partner: Not on file  . Emotionally  Abused: Not on file  . Physically Abused: Not on file  . Sexually Abused: Not on file   Family History  Problem Relation Age of Onset  . Emphysema Mother   . Aneurysm Mother   . Emphysema Father   . Coronary artery disease Brother   . Coronary artery disease Brother   . Coronary artery disease Sister    ROS: All systems reviewed and negative except as per HPI.   Current Outpatient Medications  Medication Sig Dispense Refill  . Accu-Chek FastClix Lancets MISC USE UTD    . ACCU-CHEK GUIDE test strip U TO TEST BLOOD SUGAR QD    . aspirin EC 81 MG tablet Take 81 mg by mouth daily.    Marland Kitchen atorvastatin (LIPITOR) 40 MG tablet Take 1 tablet (40 mg total) by mouth every evening. (Patient taking differently: Take 40 mg by mouth every evening. ) 30 tablet 11  . Blood Glucose Monitoring Suppl (ACCU-CHEK GUIDE) w/Device KIT U TO TEST BLOOD SUGAR QD    . carvedilol (COREG) 12.5 MG tablet Take 1 tablet (12.5 mg total) by mouth 2 (two) times daily. 60 tablet 5  . famotidine (PEPCID) 20 MG tablet Take 20 mg by mouth at bedtime.    . gabapentin (NEURONTIN) 300 MG capsule Take 300 mg by mouth 4 (four) times daily.     . hydrOXYzine (VISTARIL) 50 MG capsule Take 50 mg by mouth at bedtime.     . metFORMIN (GLUCOPHAGE) 1000 MG tablet Take 1,000 mg by mouth 2 (two) times a day.     . montelukast (SINGULAIR) 10 MG tablet Take 10 mg by mouth daily.     . Multiple Vitamin (MULTIVITAMIN) tablet Take 1 tablet by mouth daily.    . pantoprazole (PROTONIX) 40 MG tablet Take 1 tablet (40 mg total) by mouth daily. 30 tablet 1  . potassium chloride SA (KLOR-CON) 20 MEQ tablet Take 20 mEq by mouth daily.     . SYMBICORT 80-4.5 MCG/ACT inhaler Inhale 2 puffs into the lungs daily as needed.     . tamsulosin (FLOMAX) 0.4 MG CAPS capsule Take 0.4 mg by mouth daily.    Marland Kitchen torsemide (DEMADEX) 20 MG tablet Take 3 tablets (60 mg total) by mouth 2 (two) times daily. 180 tablet 3  . VENTOLIN HFA 108 (90 Base) MCG/ACT inhaler  Inhale 2 puffs into the lungs every 6 (six) hours as needed for wheezing or shortness of breath.     . Dextromethorphan HBr (ROBITUSSIN LINGERING COUGHGELS) 15 MG CAPS Take 15 mg by mouth daily as needed (cough). (Patient not taking: Reported on 12/16/2019)    . finasteride (PROSCAR) 5 MG tablet Take 5 mg by mouth daily.     No current facility-administered medications for this encounter.   BP 132/78   Pulse 78   Wt 92.8 kg (204 lb 9.6 oz)   SpO2 95%  BMI 32.04 kg/m   Wt Readings from Last 3 Encounters:  02/23/20 92.8 kg (204 lb 9.6 oz)  02/17/20 92.1 kg (203 lb)  02/10/20 92.5 kg (204 lb)  Physical Exam  General:  Well appearing. No resp difficulty HEENT: normal Neck: supple. no JVD. Carotids 2+ bilat; no bruits. No lymphadenopathy or thryomegaly appreciated. Cor: PMI nondisplaced. Regular rate & rhythm. No rubs, gallops or murmurs. Lungs: clear Abdomen: soft, nontender, nondistended. No hepatosplenomegaly. No bruits or masses. Good bowel sounds. Extremities: no cyanosis, clubbing, rash, R and LLE compression wraps.  Neuro: alert & orientedx3, cranial nerves grossly intact. moves all 4 extremities w/o difficulty. Affect pleasant  Assessment/Plan: 1. Chronic primarily diastolic CHF: Suspect that there is also significant RV failure.  Last echo in 4/20 with EF 45-50%.  Cath in 9/12 showed nonobstructive coronary disease.  S/p Cardiomems implant 11/20. Weight is stable but he is mildly volume overloaded by exam.  This correlated with elevated PADP by Cardiomems.   Cardiomems - 22 mm Hg, stable NYHA II. Volume status stable. Continue torswemide 60 mg twice a day.   - Continue Coreg 12.5 mg bid.  - Continue to follow Cardiomems. 2. [name]: Stage 3 - Check BMET daily.  3. OSA: Now on Bipap. Continue nightly.  4. Leg/foot ulcers: Peripheral arterial dopplers did not show significant PAD, suspect venous insufficiency. Ulcers are healing.  5 LBBB: Chronic.  6. Ascending aortic aneurysm: CT  chest 6/20 with 5.1 cm ascending aortic aneurysm.   - With [name] stage 3, will obtain repeat CT chest w/o contrast to follow ascending aorta.     Follow up 6 months with Dr Aundra Dubin.   Tashai Catino NP-C  02/23/2020

## 2020-02-23 ENCOUNTER — Other Ambulatory Visit: Payer: Self-pay

## 2020-02-23 ENCOUNTER — Other Ambulatory Visit (HOSPITAL_COMMUNITY): Payer: Self-pay

## 2020-02-23 ENCOUNTER — Encounter (HOSPITAL_COMMUNITY): Payer: Self-pay

## 2020-02-23 ENCOUNTER — Ambulatory Visit (HOSPITAL_COMMUNITY)
Admission: RE | Admit: 2020-02-23 | Discharge: 2020-02-23 | Disposition: A | Discharge status: Home / Self Care | Payer: Medicare Other | Source: Ambulatory Visit | Attending: Adult Health | Admitting: Adult Health

## 2020-02-23 VITALS — BP 132/78 | HR 78 | Wt 204.6 lb

## 2020-02-23 DIAGNOSIS — E1122 Type 2 diabetes mellitus with diabetic chronic kidney disease: Secondary | ICD-10-CM | POA: Diagnosis not present

## 2020-02-23 DIAGNOSIS — I5022 Chronic systolic (congestive) heart failure: Secondary | ICD-10-CM | POA: Diagnosis not present

## 2020-02-23 DIAGNOSIS — E11621 Type 2 diabetes mellitus with foot ulcer: Secondary | ICD-10-CM | POA: Diagnosis not present

## 2020-02-23 DIAGNOSIS — Z8673 Personal history of transient ischemic attack (TIA), and cerebral infarction without residual deficits: Secondary | ICD-10-CM | POA: Diagnosis not present

## 2020-02-23 DIAGNOSIS — Z8701 Personal history of pneumonia (recurrent): Secondary | ICD-10-CM | POA: Insufficient documentation

## 2020-02-23 DIAGNOSIS — I712 Thoracic aortic aneurysm, without rupture: Secondary | ICD-10-CM | POA: Diagnosis not present

## 2020-02-23 DIAGNOSIS — I2510 Atherosclerotic heart disease of native coronary artery without angina pectoris: Secondary | ICD-10-CM | POA: Diagnosis not present

## 2020-02-23 DIAGNOSIS — I5032 Chronic diastolic (congestive) heart failure: Secondary | ICD-10-CM | POA: Insufficient documentation

## 2020-02-23 DIAGNOSIS — Z7951 Long term (current) use of inhaled steroids: Secondary | ICD-10-CM | POA: Diagnosis not present

## 2020-02-23 DIAGNOSIS — I5030 Unspecified diastolic (congestive) heart failure: Secondary | ICD-10-CM

## 2020-02-23 DIAGNOSIS — Z7984 Long term (current) use of oral hypoglycemic drugs: Secondary | ICD-10-CM | POA: Diagnosis not present

## 2020-02-23 DIAGNOSIS — E785 Hyperlipidemia, unspecified: Secondary | ICD-10-CM | POA: Insufficient documentation

## 2020-02-23 DIAGNOSIS — Z8674 Personal history of sudden cardiac arrest: Secondary | ICD-10-CM | POA: Insufficient documentation

## 2020-02-23 DIAGNOSIS — L97529 Non-pressure chronic ulcer of other part of left foot with unspecified severity: Secondary | ICD-10-CM | POA: Diagnosis not present

## 2020-02-23 DIAGNOSIS — Z79899 Other long term (current) drug therapy: Secondary | ICD-10-CM | POA: Insufficient documentation

## 2020-02-23 DIAGNOSIS — I130 Hypertensive heart and chronic kidney disease with heart failure and stage 1 through stage 4 chronic kidney disease, or unspecified chronic kidney disease: Secondary | ICD-10-CM | POA: Insufficient documentation

## 2020-02-23 DIAGNOSIS — G4733 Obstructive sleep apnea (adult) (pediatric): Secondary | ICD-10-CM | POA: Diagnosis not present

## 2020-02-23 DIAGNOSIS — Z7982 Long term (current) use of aspirin: Secondary | ICD-10-CM | POA: Insufficient documentation

## 2020-02-23 DIAGNOSIS — I447 Left bundle-branch block, unspecified: Secondary | ICD-10-CM | POA: Insufficient documentation

## 2020-02-23 DIAGNOSIS — N183 Chronic kidney disease, stage 3 unspecified: Secondary | ICD-10-CM | POA: Diagnosis not present

## 2020-02-23 DIAGNOSIS — Z8249 Family history of ischemic heart disease and other diseases of the circulatory system: Secondary | ICD-10-CM | POA: Insufficient documentation

## 2020-02-23 LAB — BASIC METABOLIC PANEL
Anion gap: 14 (ref 5–15)
BUN: 36 mg/dL — ABNORMAL HIGH (ref 8–23)
CO2: 28 mmol/L (ref 22–32)
Calcium: 8.2 mg/dL — ABNORMAL LOW (ref 8.9–10.3)
Chloride: 98 mmol/L (ref 98–111)
Creatinine, Ser: 1.63 mg/dL — ABNORMAL HIGH (ref 0.61–1.24)
GFR calc Af Amer: 49 mL/min — ABNORMAL LOW (ref >60)
GFR calc non Af Amer: 42 mL/min — ABNORMAL LOW (ref >60)
Glucose, Bld: 226 mg/dL — ABNORMAL HIGH (ref 70–99)
Potassium: 3.6 mmol/L (ref 3.5–5.1)
Sodium: 140 mmol/L (ref 135–145)

## 2020-02-23 NOTE — Progress Notes (Signed)
Paramedicine Encounter   Patient ID: [name] , male,   DOB: 1950-12-26,69 y.o.,  MRN: [id number]   Met patient in clinic today with provider.  Pt reports he went to see PCP on Friday for f/u and to get some refills on meds. He thinks she put him back on glipizide but he decided to not take it from the pharmacy.   Today his fluid level looks good.  Amy will see him back in 6 months.  Will need to plan exit strategy for future to be d/c.  He has been doing very well.   B/p-132/78 p-78 sp02-95 Weight @ [place], [place] 02/23/2020

## 2020-02-23 NOTE — Patient Instructions (Addendum)
Lab work done today. We will notify you of any abnormal lab work. No news is good news!  Please follow up with the [facility] in 6 months. We currently do not have that schedule. Please give [place] a call in Nov 2021 in order to schedule your appointment for Feb 2022. Please call [phone] option #3.  At the [facility], you and your health needs are our priority. As part of our continuing mission to provide you with exceptional heart care, we have created designated Provider Care Teams. These Care Teams include your primary Cardiologist (physician) and Advanced Practice Providers (APPs- Physician Assistants and Nurse Practitioners) who all work together to provide you with the care you need, when you need it.   You may see any of the following providers on your designated Care Team at your next follow up: Marland Kitchen Dr Arvilla Meres . Dr Marca Ancona . Tonye Becket, NP . Robbie Lis, PA . Karle Plumber, PharmD   Please be sure to bring in all your medications bottles to every appointment.

## 2020-02-24 ENCOUNTER — Other Ambulatory Visit (HOSPITAL_COMMUNITY): Payer: Self-pay

## 2020-02-24 NOTE — Progress Notes (Signed)
Paramedicine Encounter    Patient ID: [name], male    DOB: 21-Jul-1950, 69 y.o.   MRN: [id number]   Patient Care Team: Care, Jinny Blossom [facility] as PCP - General (Family Medicine) Sherren Mocha, MD as PCP - Cardiology (Cardiology) Larey Dresser, MD as PCP - Advanced Heart Failure (Cardiology)  Patient Active Problem List   Diagnosis Date Noted  . Hallux limitus of left foot 06/05/2019  . Hallux limitus of right foot 06/05/2019  . Seasonal allergies 02/12/2019  . Health care maintenance 02/12/2019  . GERD (gastroesophageal reflux disease) 02/12/2019  . Nocturnal hypoxemia 02/12/2019  . OSA (obstructive sleep apnea) 02/12/2019  . Pain due to onychomycosis of toenails of both feet 01/30/2019  . Lobar pneumonia ([place]) 12/29/2018  . Diabetes mellitus type 2, controlled, without complications ([place]) 77/41/2878  . Thoracic aortic aneurysm ([place]) 12/29/2018  . Acute on chronic combined systolic and diastolic CHF (congestive heart failure) ([place]) 12/25/2018  . Syncope 12/25/2018  . Chronic systolic heart failure ([place]) 11/19/2018  . Chronic venous insufficiency 06/24/2018  . Respiratory failure ([place]) 06/01/2018  . Leg swelling 05/06/2018  . Acute respiratory failure with hypoxemia ([place]) 10/14/2017  . Influenza-like illness 10/14/2017  . Delirium 10/14/2017  . Acute urinary retention 10/14/2017  . (HFpEF) heart failure with preserved ejection fraction ([place]), pulmonary edema and LBBB of unknown chronicity  10/29/2016  . Elevated troponin I level, presume demand ischemia  10/29/2016  . AKI (acute kidney injury) ([place]) 10/29/2016  . BPH (benign prostatic hyperplasia) 10/29/2016  . Sepsis ([place]) 10/29/2016  . Bacteremia due to Escherichia coli 10/29/2016  . Fatty liver 10/29/2016  . Acute pulmonary edema (HCC)   . Acute hypoxic and hypercarbic respiratory failure (HCC) in setting of bilateral pulmonary infiltrates. Presume CAP + pulmonary edema +/-ALI 10/27/2016  . Left-sided  weakness 05/04/2015  . TIA (transient ischemic attack) : Rule out. 05/04/2015  . Hyperlipidemia 05/04/2015  . Chest pain 10/10/2013  . SOB (shortness of breath) 10/10/2013  . Hyperglycemia without ketosis 10/17/2011  . HTN (hypertension) 10/17/2011  . Foot drop, left 10/17/2011  . Impacted cerumen of right ear 10/17/2011  . Dyspnea 10/17/2011  . History of TIA (transient ischemic attack) 10/17/2011  . Neuropathy ([place]) 10/17/2011  . Aortic insufficiency 03/16/2011  . Chest pain 03/16/2011    Current Outpatient Medications:  .  Accu-Chek FastClix Lancets MISC, USE UTD, Disp: , Rfl:  .  ACCU-CHEK GUIDE test strip, U TO TEST BLOOD SUGAR QD, Disp: , Rfl:  .  aspirin EC 81 MG tablet, Take 81 mg by mouth daily., Disp: , Rfl:  .  atorvastatin (LIPITOR) 40 MG tablet, Take 1 tablet (40 mg total) by mouth every evening. (Patient taking differently: Take 40 mg by mouth every evening. ), Disp: 30 tablet, Rfl: 11 .  Blood Glucose Monitoring Suppl (ACCU-CHEK GUIDE) w/Device KIT, U TO TEST BLOOD SUGAR QD, Disp: , Rfl:  .  carvedilol (COREG) 12.5 MG tablet, Take 1 tablet (12.5 mg total) by mouth 2 (two) times daily., Disp: 60 tablet, Rfl: 5 .  famotidine (PEPCID) 20 MG tablet, Take 20 mg by mouth at bedtime., Disp: , Rfl:  .  finasteride (PROSCAR) 5 MG tablet, Take 5 mg by mouth daily., Disp: , Rfl:  .  gabapentin (NEURONTIN) 300 MG capsule, Take 300 mg by mouth 4 (four) times daily. , Disp: , Rfl:  .  hydrOXYzine (VISTARIL) 50 MG capsule, Take 50 mg by mouth at bedtime. , Disp: , Rfl:  .  metFORMIN (  GLUCOPHAGE) 1000 MG tablet, Take 1,000 mg by mouth 2 (two) times a day. , Disp: , Rfl:  .  montelukast (SINGULAIR) 10 MG tablet, Take 10 mg by mouth daily. , Disp: , Rfl:  .  Multiple Vitamin (MULTIVITAMIN) tablet, Take 1 tablet by mouth daily., Disp: , Rfl:  .  pantoprazole (PROTONIX) 40 MG tablet, Take 1 tablet (40 mg total) by mouth daily., Disp: 30 tablet, Rfl: 1 .  potassium chloride SA (KLOR-CON) 20  MEQ tablet, Take 20 mEq by mouth daily. , Disp: , Rfl:  .  SYMBICORT 80-4.5 MCG/ACT inhaler, Inhale 2 puffs into the lungs daily as needed. , Disp: , Rfl:  .  tamsulosin (FLOMAX) 0.4 MG CAPS capsule, Take 0.4 mg by mouth daily., Disp: , Rfl:  .  torsemide (DEMADEX) 20 MG tablet, Take 3 tablets (60 mg total) by mouth 2 (two) times daily., Disp: 180 tablet, Rfl: 3 .  VENTOLIN HFA 108 (90 Base) MCG/ACT inhaler, Inhale 2 puffs into the lungs every 6 (six) hours as needed for wheezing or shortness of breath. , Disp: , Rfl:  .  Dextromethorphan HBr (ROBITUSSIN LINGERING COUGHGELS) 15 MG CAPS, Take 15 mg by mouth daily as needed (cough). (Patient not taking: Reported on 12/16/2019), Disp: , Rfl:  Allergies  Allergen Reactions  . Cashew Nut Oil Palpitations  . Percocet [Oxycodone-Acetaminophen] Other (See Comments)    Hallucintaions      Social History   Socioeconomic History  . Marital status: Married    Spouse name: Not on file  . Number of children: Not on file  . Years of education: Not on file  . Highest education level: Not on file  Occupational History  . Not on file  Tobacco Use  . Smoking status: Never Smoker  . Smokeless tobacco: Never Used  Vaping Use  . Vaping Use: Never used  Substance and Sexual Activity  . Alcohol use: No  . Drug use: No  . Sexual activity: Not Currently    Partners: Female    Birth control/protection: None  Other Topics Concern  . Not on file  Social History Narrative   Patient lives in private residence with supportive spouse   Social Determinants of Health   Financial Resource Strain:   . Difficulty of Paying Living Expenses: Not on file  Food Insecurity:   . Worried About Charity fundraiser in the Last Year: Not on file  . Ran Out of Food in the Last Year: Not on file  Transportation Needs:   . Lack of Transportation (Medical): Not on file  . Lack of Transportation (Non-Medical): Not on file  Physical Activity:   . Days of Exercise per  Week: Not on file  . Minutes of Exercise per Session: Not on file  Stress:   . Feeling of Stress : Not on file  Social Connections:   . Frequency of Communication with Friends and Family: Not on file  . Frequency of Social Gatherings with Friends and Family: Not on file  . Attends Religious Services: Not on file  . Active Member of Clubs or Organizations: Not on file  . Attends Archivist Meetings: Not on file  . Marital Status: Not on file  Intimate Partner Violence:   . Fear of Current or Ex-Partner: Not on file  . Emotionally Abused: Not on file  . Physically Abused: Not on file  . Sexually Abused: Not on file    Physical Exam      Future Appointments  Date Time Provider [place]  03/15/2020  2:15 PM Gardiner Barefoot, DPM [facility]    BP 118/84   Pulse 74   Temp (!) 97.1 F (36.2 C)   Resp 18   Wt 201 lb (91.2 kg)   SpO2 97%   BMI 31.48 kg/m   Weight yesterday-203 Last visit weight-203  Pt reports he doesn't feel too well this morning. Wife reports he got upset this morning over a pillow. He also has been having gas on his stomach with belching a lot  this morning, having to take a tums.   He states the gas pains feel like this and catches his breath at times and feels better when he stands. He has felt like this before and the tums usually helps.   No missed doses of his meds. meds verified and pill box refilled.  He states pharmacy is calling them regarding med p/u. I told them unless it was an inhaler everything on his list he has and doesn't need refilled.  Will see next week. I told him in the next couple months we will have to d/c him and will work on his meds to get him comfortable with either him filling pill box or his wife.    [name], [facility] Paramedic  02/24/20

## 2020-02-28 ENCOUNTER — Other Ambulatory Visit (HOSPITAL_COMMUNITY): Payer: Self-pay | Admitting: Cardiology

## 2020-02-29 ENCOUNTER — Encounter (INDEPENDENT_AMBULATORY_CARE_PROVIDER_SITE_OTHER): Payer: Medicare Other | Admitting: Ophthalmology

## 2020-03-02 ENCOUNTER — Other Ambulatory Visit (HOSPITAL_COMMUNITY): Payer: Self-pay

## 2020-03-02 NOTE — Progress Notes (Signed)
Paramedicine Encounter    Patient ID: [name], male    DOB: [birth year], 69 y.o.   MRN: [id number]   Patient Care Team: Care, Jinny Blossom [facility] as PCP - General (Family Medicine) Sherren Mocha, MD as PCP - Cardiology (Cardiology) Larey Dresser, MD as PCP - Advanced Heart Failure (Cardiology)  Patient Active Problem List   Diagnosis Date Noted  . Hallux limitus of left foot 06/05/2019  . Hallux limitus of right foot 06/05/2019  . Seasonal allergies 02/12/2019  . Health care maintenance 02/12/2019  . GERD (gastroesophageal reflux disease) 02/12/2019  . Nocturnal hypoxemia 02/12/2019  . OSA (obstructive sleep apnea) 02/12/2019  . Pain due to onychomycosis of toenails of both feet 01/30/2019  . Lobar pneumonia ([place]) 12/29/2018  . Diabetes mellitus type 2, controlled, without complications ([place]) 62/83/6629  . Thoracic aortic aneurysm ([place]) 12/29/2018  . Acute on chronic combined systolic and diastolic CHF (congestive heart failure) ([place]) 12/25/2018  . Syncope 12/25/2018  . Chronic systolic heart failure ([place]) 11/19/2018  . Chronic venous insufficiency 06/24/2018  . Respiratory failure ([place]) 06/01/2018  . Leg swelling 05/06/2018  . Acute respiratory failure with hypoxemia ([place]) 10/14/2017  . Influenza-like illness 10/14/2017  . Delirium 10/14/2017  . Acute urinary retention 10/14/2017  . (HFpEF) heart failure with preserved ejection fraction ([place]), pulmonary edema and LBBB of unknown chronicity  10/29/2016  . Elevated troponin I level, presume demand ischemia  10/29/2016  . AKI (acute kidney injury) ([place]) 10/29/2016  . BPH (benign prostatic hyperplasia) 10/29/2016  . Sepsis ([place]) 10/29/2016  . Bacteremia due to Escherichia coli 10/29/2016  . Fatty liver 10/29/2016  . Acute pulmonary edema (HCC)   . Acute hypoxic and hypercarbic respiratory failure (HCC) in setting of bilateral pulmonary infiltrates. Presume CAP + pulmonary edema +/-ALI 10/27/2016  . Left-sided  weakness 05/04/2015  . TIA (transient ischemic attack) : Rule out. 05/04/2015  . Hyperlipidemia 05/04/2015  . Chest pain 10/10/2013  . SOB (shortness of breath) 10/10/2013  . Hyperglycemia without ketosis 10/17/2011  . HTN (hypertension) 10/17/2011  . Foot drop, left 10/17/2011  . Impacted cerumen of right ear 10/17/2011  . Dyspnea 10/17/2011  . History of TIA (transient ischemic attack) 10/17/2011  . Neuropathy ([place]) 10/17/2011  . Aortic insufficiency 03/16/2011  . Chest pain 03/16/2011    Current Outpatient Medications:  .  Accu-Chek FastClix Lancets MISC, USE UTD, Disp: , Rfl:  .  ACCU-CHEK GUIDE test strip, U TO TEST BLOOD SUGAR QD, Disp: , Rfl:  .  aspirin EC 81 MG tablet, Take 81 mg by mouth daily., Disp: , Rfl:  .  atorvastatin (LIPITOR) 40 MG tablet, Take 1 tablet (40 mg total) by mouth every evening. (Patient taking differently: Take 40 mg by mouth every evening. ), Disp: 30 tablet, Rfl: 11 .  Blood Glucose Monitoring Suppl (ACCU-CHEK GUIDE) w/Device KIT, U TO TEST BLOOD SUGAR QD, Disp: , Rfl:  .  carvedilol (COREG) 12.5 MG tablet, TAKE ONE TABLET BY MOUTH TWICE A DAY, Disp: 180 tablet, Rfl: 3 .  famotidine (PEPCID) 20 MG tablet, Take 20 mg by mouth at bedtime., Disp: , Rfl:  .  finasteride (PROSCAR) 5 MG tablet, Take 5 mg by mouth daily., Disp: , Rfl:  .  gabapentin (NEURONTIN) 300 MG capsule, Take 300 mg by mouth 4 (four) times daily. , Disp: , Rfl:  .  hydrOXYzine (VISTARIL) 50 MG capsule, Take 50 mg by mouth at bedtime. , Disp: , Rfl:  .  metFORMIN (GLUCOPHAGE) 1000 MG tablet,  Take 1,000 mg by mouth 2 (two) times a day. , Disp: , Rfl:  .  montelukast (SINGULAIR) 10 MG tablet, Take 10 mg by mouth daily. , Disp: , Rfl:  .  Multiple Vitamin (MULTIVITAMIN) tablet, Take 1 tablet by mouth daily., Disp: , Rfl:  .  pantoprazole (PROTONIX) 40 MG tablet, Take 1 tablet (40 mg total) by mouth daily., Disp: 30 tablet, Rfl: 1 .  potassium chloride SA (KLOR-CON) 20 MEQ tablet, Take 20  mEq by mouth daily. , Disp: , Rfl:  .  SYMBICORT 80-4.5 MCG/ACT inhaler, Inhale 2 puffs into the lungs daily as needed. , Disp: , Rfl:  .  tamsulosin (FLOMAX) 0.4 MG CAPS capsule, Take 0.4 mg by mouth daily., Disp: , Rfl:  .  torsemide (DEMADEX) 20 MG tablet, Take 3 tablets (60 mg total) by mouth 2 (two) times daily., Disp: 180 tablet, Rfl: 3 .  VENTOLIN HFA 108 (90 Base) MCG/ACT inhaler, Inhale 2 puffs into the lungs every 6 (six) hours as needed for wheezing or shortness of breath. , Disp: , Rfl:  .  Dextromethorphan HBr (ROBITUSSIN LINGERING COUGHGELS) 15 MG CAPS, Take 15 mg by mouth daily as needed (cough). (Patient not taking: Reported on 12/16/2019), Disp: , Rfl:  Allergies  Allergen Reactions  . Cashew Nut Oil Palpitations  . Percocet [Oxycodone-Acetaminophen] Other (See Comments)    Hallucintaions      Social History   Socioeconomic History  . Marital status: Married    Spouse name: Not on file  . Number of children: Not on file  . Years of education: Not on file  . Highest education level: Not on file  Occupational History  . Not on file  Tobacco Use  . Smoking status: Never Smoker  . Smokeless tobacco: Never Used  Vaping Use  . Vaping Use: Never used  Substance and Sexual Activity  . Alcohol use: No  . Drug use: No  . Sexual activity: Not Currently    Partners: Female    Birth control/protection: None  Other Topics Concern  . Not on file  Social History Narrative   Patient lives in private residence with supportive spouse   Social Determinants of Health   Financial Resource Strain:   . Difficulty of Paying Living Expenses: Not on file  Food Insecurity:   . Worried About Charity fundraiser in the Last Year: Not on file  . Ran Out of Food in the Last Year: Not on file  Transportation Needs:   . Lack of Transportation (Medical): Not on file  . Lack of Transportation (Non-Medical): Not on file  Physical Activity:   . Days of Exercise per Week: Not on file  .  Minutes of Exercise per Session: Not on file  Stress:   . Feeling of Stress : Not on file  Social Connections:   . Frequency of Communication with Friends and Family: Not on file  . Frequency of Social Gatherings with Friends and Family: Not on file  . Attends Religious Services: Not on file  . Active Member of Clubs or Organizations: Not on file  . Attends Archivist Meetings: Not on file  . Marital Status: Not on file  Intimate Partner Violence:   . Fear of Current or Ex-Partner: Not on file  . Emotionally Abused: Not on file  . Physically Abused: Not on file  . Sexually Abused: Not on file    Physical Exam      Future Appointments  Date Time Provider  [place]  03/15/2020  2:15 PM Gardiner Barefoot, DPM [facility]    BP 118/72   Pulse 97   Temp (!) 97.3 F (36.3 C)   Resp 18   Wt 201 lb (91.2 kg)   SpO2 (!) 70%   BMI 31.48 kg/m   Weight yesterday-202 Last visit weight-201  Pt reports he is doing well. His weight is stable.  He has scratched a few sores on his leg--wife has placed some cream on it. Advised him to please not do this with his diabetes it takes longer to heal and increased risk of infection.  No missed doses of his meds.  meds verified and pill box refilled.  He states he was able to take his meds on his own but unable to fill pill box, so I filled pill box for this week. The next week he will take on his own dosing for meds and I will f/u the week after to see how he is doing that. After a few wks of him dosing meds himself if all goes well then we can d/c from paramedicine.   [name], [place] [facility] Paramedic  03/02/20

## 2020-03-15 ENCOUNTER — Other Ambulatory Visit: Payer: Self-pay

## 2020-03-15 ENCOUNTER — Encounter: Payer: Self-pay | Admitting: Podiatry

## 2020-03-15 ENCOUNTER — Ambulatory Visit (INDEPENDENT_AMBULATORY_CARE_PROVIDER_SITE_OTHER): Payer: Medicare Other | Admitting: Podiatry

## 2020-03-15 DIAGNOSIS — B351 Tinea unguium: Secondary | ICD-10-CM | POA: Diagnosis not present

## 2020-03-15 DIAGNOSIS — M79674 Pain in right toe(s): Secondary | ICD-10-CM

## 2020-03-15 DIAGNOSIS — M79675 Pain in left toe(s): Secondary | ICD-10-CM

## 2020-03-15 DIAGNOSIS — E119 Type 2 diabetes mellitus without complications: Secondary | ICD-10-CM | POA: Diagnosis not present

## 2020-03-15 DIAGNOSIS — I872 Venous insufficiency (chronic) (peripheral): Secondary | ICD-10-CM | POA: Diagnosis not present

## 2020-03-15 NOTE — Progress Notes (Signed)
This patient returns to my office for at risk foot care.  This patient requires this care by a professional since this patient will be at risk due to having diabetes type 2 and chronic venous insufficiency.  This patient is unable to cut nails himself since the patient cannot reach his nails.These nails are painful walking and wearing shoes.  This patient presents for at risk foot care today.  General Appearance  Alert, conversant and in no acute stress.  Vascular  Dorsalis pedis and posterior tibial  pulses are not  palpable  bilaterally.  Capillary return is within normal limits  bilaterally. Temperature is within normal limits  bilaterally.  Neurologic  Senn-Weinstein monofilament wire test diminished/absent  bilaterally. Muscle power within normal limits bilaterally.  Nails Thick disfigured discolored nails with subungual debris  from hallux to fifth toes bilaterally. No evidence of bacterial infection or drainage bilaterally.  Orthopedic  No limitations of motion  feet .  No crepitus or effusions noted.  No bony pathology or digital deformities noted.  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  Midfoot  DJD  B/L.  Limited rearfoot  ROM  B/L.  Skin  normotropic skin with no porokeratosis noted bilaterally.  No signs of infections or ulcers noted.     Onychomycosis  Pain in right toes  Pain in left toes  Consent was obtained for treatment procedures.   Mechanical debridement of nails 1-5  bilaterally performed with a nail nipper.  Filed with dremel without incident.    Return office visit    3 months                  Told patient to return for periodic foot care and evaluation due to potential at risk complications.   Geran Haithcock DPM  

## 2020-03-16 ENCOUNTER — Other Ambulatory Visit (HOSPITAL_COMMUNITY): Payer: Self-pay

## 2020-03-16 NOTE — Progress Notes (Signed)
Paramedicine Encounter    Patient ID: [name], male    DOB: 1950/08/12, 69 y.o.   MRN: [id number]   Patient Care Team: Care, Jinny Blossom [facility] as PCP - General (Family Medicine) Sherren Mocha, MD as PCP - Cardiology (Cardiology) Larey Dresser, MD as PCP - Advanced Heart Failure (Cardiology)  Patient Active Problem List   Diagnosis Date Noted  . Hallux limitus of left foot 06/05/2019  . Hallux limitus of right foot 06/05/2019  . Seasonal allergies 02/12/2019  . Health care maintenance 02/12/2019  . GERD (gastroesophageal reflux disease) 02/12/2019  . Nocturnal hypoxemia 02/12/2019  . OSA (obstructive sleep apnea) 02/12/2019  . Pain due to onychomycosis of toenails of both feet 01/30/2019  . Lobar pneumonia ([place]) 12/29/2018  . Diabetes mellitus type 2, controlled, without complications ([place]) 60/04/9322  . Thoracic aortic aneurysm ([place]) 12/29/2018  . Acute on chronic combined systolic and diastolic CHF (congestive heart failure) ([place]) 12/25/2018  . Syncope 12/25/2018  . Chronic systolic heart failure ([place]) 11/19/2018  . Chronic venous insufficiency 06/24/2018  . Respiratory failure ([place]) 06/01/2018  . Leg swelling 05/06/2018  . Acute respiratory failure with hypoxemia ([place]) 10/14/2017  . Influenza-like illness 10/14/2017  . Delirium 10/14/2017  . Acute urinary retention 10/14/2017  . (HFpEF) heart failure with preserved ejection fraction ([place]), pulmonary edema and LBBB of unknown chronicity  10/29/2016  . Elevated troponin I level, presume demand ischemia  10/29/2016  . AKI (acute kidney injury) ([place]) 10/29/2016  . BPH (benign prostatic hyperplasia) 10/29/2016  . Sepsis ([place]) 10/29/2016  . Bacteremia due to Escherichia coli 10/29/2016  . Fatty liver 10/29/2016  . Acute pulmonary edema (HCC)   . Acute hypoxic and hypercarbic respiratory failure (HCC) in setting of bilateral pulmonary infiltrates. Presume CAP + pulmonary edema +/-ALI 10/27/2016  . Left-sided  weakness 05/04/2015  . TIA (transient ischemic attack) : Rule out. 05/04/2015  . Hyperlipidemia 05/04/2015  . Chest pain 10/10/2013  . SOB (shortness of breath) 10/10/2013  . Hyperglycemia without ketosis 10/17/2011  . HTN (hypertension) 10/17/2011  . Foot drop, left 10/17/2011  . Impacted cerumen of right ear 10/17/2011  . Dyspnea 10/17/2011  . History of TIA (transient ischemic attack) 10/17/2011  . Neuropathy ([place]) 10/17/2011  . Aortic insufficiency 03/16/2011  . Chest pain 03/16/2011    Current Outpatient Medications:  .  Accu-Chek FastClix Lancets MISC, USE UTD, Disp: , Rfl:  .  ACCU-CHEK GUIDE test strip, U TO TEST BLOOD SUGAR QD, Disp: , Rfl:  .  aspirin EC 81 MG tablet, Take 81 mg by mouth daily., Disp: , Rfl:  .  atorvastatin (LIPITOR) 40 MG tablet, Take 1 tablet (40 mg total) by mouth every evening. (Patient taking differently: Take 40 mg by mouth every evening. ), Disp: 30 tablet, Rfl: 11 .  Blood Glucose Monitoring Suppl (ACCU-CHEK GUIDE) w/Device KIT, U TO TEST BLOOD SUGAR QD, Disp: , Rfl:  .  carvedilol (COREG) 12.5 MG tablet, TAKE ONE TABLET BY MOUTH TWICE A DAY, Disp: 180 tablet, Rfl: 3 .  famotidine (PEPCID) 20 MG tablet, Take 20 mg by mouth at bedtime., Disp: , Rfl:  .  finasteride (PROSCAR) 5 MG tablet, Take 5 mg by mouth daily., Disp: , Rfl:  .  gabapentin (NEURONTIN) 300 MG capsule, Take 300 mg by mouth 4 (four) times daily. , Disp: , Rfl:  .  hydrOXYzine (VISTARIL) 50 MG capsule, Take 50 mg by mouth at bedtime. , Disp: , Rfl:  .  metFORMIN (GLUCOPHAGE) 1000 MG tablet,  Take 1,000 mg by mouth 2 (two) times a day. , Disp: , Rfl:  .  montelukast (SINGULAIR) 10 MG tablet, Take 10 mg by mouth daily. , Disp: , Rfl:  .  Multiple Vitamin (MULTIVITAMIN) tablet, Take 1 tablet by mouth daily., Disp: , Rfl:  .  pantoprazole (PROTONIX) 40 MG tablet, Take 1 tablet (40 mg total) by mouth daily., Disp: 30 tablet, Rfl: 1 .  potassium chloride SA (KLOR-CON) 20 MEQ tablet, Take 20  mEq by mouth daily. , Disp: , Rfl:  .  SYMBICORT 80-4.5 MCG/ACT inhaler, Inhale 2 puffs into the lungs daily as needed. , Disp: , Rfl:  .  tamsulosin (FLOMAX) 0.4 MG CAPS capsule, Take 0.4 mg by mouth daily., Disp: , Rfl:  .  torsemide (DEMADEX) 20 MG tablet, Take 3 tablets (60 mg total) by mouth 2 (two) times daily., Disp: 180 tablet, Rfl: 3 .  VENTOLIN HFA 108 (90 Base) MCG/ACT inhaler, Inhale 2 puffs into the lungs every 6 (six) hours as needed for wheezing or shortness of breath. , Disp: , Rfl:  .  Dextromethorphan HBr (ROBITUSSIN LINGERING COUGHGELS) 15 MG CAPS, Take 15 mg by mouth daily as needed (cough).  (Patient not taking: Reported on 03/16/2020), Disp: , Rfl:  Allergies  Allergen Reactions  . Cashew Nut Oil Palpitations  . Percocet [Oxycodone-Acetaminophen] Other (See Comments)    Hallucintaions      Social History   Socioeconomic History  . Marital status: Married    Spouse name: Not on file  . Number of children: Not on file  . Years of education: Not on file  . Highest education level: Not on file  Occupational History  . Not on file  Tobacco Use  . Smoking status: Never Smoker  . Smokeless tobacco: Never Used  Vaping Use  . Vaping Use: Never used  Substance and Sexual Activity  . Alcohol use: No  . Drug use: No  . Sexual activity: Not Currently    Partners: Female    Birth control/protection: None  Other Topics Concern  . Not on file  Social History Narrative   Patient lives in private residence with supportive spouse   Social Determinants of Health   Financial Resource Strain:   . Difficulty of Paying Living Expenses: Not on file  Food Insecurity:   . Worried About Programme researcher, broadcasting/film/video in the Last Year: Not on file  . Ran Out of Food in the Last Year: Not on file  Transportation Needs:   . Lack of Transportation (Medical): Not on file  . Lack of Transportation (Non-Medical): Not on file  Physical Activity:   . Days of Exercise per Week: Not on file   . Minutes of Exercise per Session: Not on file  Stress:   . Feeling of Stress : Not on file  Social Connections:   . Frequency of Communication with Friends and Family: Not on file  . Frequency of Social Gatherings with Friends and Family: Not on file  . Attends Religious Services: Not on file  . Active Member of Clubs or Organizations: Not on file  . Attends Banker Meetings: Not on file  . Marital Status: Not on file  Intimate Partner Violence:   . Fear of Current or Ex-Partner: Not on file  . Emotionally Abused: Not on file  . Physically Abused: Not on file  . Sexually Abused: Not on file    Physical Exam      Future Appointments  Date Time  Provider [place]  06/15/2020 11:30 AM Gardiner Barefoot, DPM [facility]    BP 126/70   Pulse 66   Temp 97.7 F (36.5 C)   Resp 18   Wt 201 lb (91.2 kg)   SpO2 97%   BMI 31.48 kg/m   Weight yesterday-201 Last visit weight-201  Pt reports he is doing well. He is feeling well.  For the past week he has been dosing himself with his meds. He feels comfortable with his system that he is using-each day once he takes his morning meds he adds the afternoon, evening and bedtime doses in the pill tray. He cannot do the whole week just one day at a time  There have been no issues with his cardiomems. No phone calls from abnormal readings.  He denies increased sob. No edema noted. No c/p, no dizziness.  Will see him again in 2wks and look to d/c from paramedicine after that.   [name], [facility] Paramedic  03/16/20

## 2020-03-30 ENCOUNTER — Other Ambulatory Visit (HOSPITAL_COMMUNITY): Payer: Self-pay | Admitting: Adult Health

## 2020-03-30 ENCOUNTER — Other Ambulatory Visit (HOSPITAL_COMMUNITY): Payer: Self-pay

## 2020-03-30 NOTE — Progress Notes (Signed)
Paramedicine Encounter    Patient ID: [name], male    DOB: 04/21/1951, 69 y.o.   MRN: [id number]   Patient Care Team: Care, Jinny Blossom [facility] as PCP - General (Family Medicine) Sherren Mocha, MD as PCP - Cardiology (Cardiology) Larey Dresser, MD as PCP - Advanced Heart Failure (Cardiology)  Patient Active Problem List   Diagnosis Date Noted  . Hallux limitus of left foot 06/05/2019  . Hallux limitus of right foot 06/05/2019  . Seasonal allergies 02/12/2019  . Health care maintenance 02/12/2019  . GERD (gastroesophageal reflux disease) 02/12/2019  . Nocturnal hypoxemia 02/12/2019  . OSA (obstructive sleep apnea) 02/12/2019  . Pain due to onychomycosis of toenails of both feet 01/30/2019  . Lobar pneumonia ([place]) 12/29/2018  . Diabetes mellitus type 2, controlled, without complications ([place]) 95/28/4132  . Thoracic aortic aneurysm ([place]) 12/29/2018  . Acute on chronic combined systolic and diastolic CHF (congestive heart failure) ([place]) 12/25/2018  . Syncope 12/25/2018  . Chronic systolic heart failure ([place]) 11/19/2018  . Chronic venous insufficiency 06/24/2018  . Respiratory failure ([place]) 06/01/2018  . Leg swelling 05/06/2018  . Acute respiratory failure with hypoxemia ([place]) 10/14/2017  . Influenza-like illness 10/14/2017  . Delirium 10/14/2017  . Acute urinary retention 10/14/2017  . (HFpEF) heart failure with preserved ejection fraction ([place]), pulmonary edema and LBBB of unknown chronicity  10/29/2016  . Elevated troponin I level, presume demand ischemia  10/29/2016  . AKI (acute kidney injury) ([place]) 10/29/2016  . BPH (benign prostatic hyperplasia) 10/29/2016  . Sepsis ([place]) 10/29/2016  . Bacteremia due to Escherichia coli 10/29/2016  . Fatty liver 10/29/2016  . Acute pulmonary edema (HCC)   . Acute hypoxic and hypercarbic respiratory failure (HCC) in setting of bilateral pulmonary infiltrates. Presume CAP + pulmonary edema +/-ALI 10/27/2016  . Left-sided  weakness 05/04/2015  . TIA (transient ischemic attack) : Rule out. 05/04/2015  . Hyperlipidemia 05/04/2015  . Chest pain 10/10/2013  . SOB (shortness of breath) 10/10/2013  . Hyperglycemia without ketosis 10/17/2011  . HTN (hypertension) 10/17/2011  . Foot drop, left 10/17/2011  . Impacted cerumen of right ear 10/17/2011  . Dyspnea 10/17/2011  . History of TIA (transient ischemic attack) 10/17/2011  . Neuropathy ([place]) 10/17/2011  . Aortic insufficiency 03/16/2011  . Chest pain 03/16/2011    Current Outpatient Medications:  .  aspirin EC 81 MG tablet, Take 81 mg by mouth daily., Disp: , Rfl:  .  atorvastatin (LIPITOR) 40 MG tablet, Take 1 tablet (40 mg total) by mouth every evening. (Patient taking differently: Take 40 mg by mouth every evening. ), Disp: 30 tablet, Rfl: 11 .  carvedilol (COREG) 12.5 MG tablet, TAKE ONE TABLET BY MOUTH TWICE A DAY, Disp: 180 tablet, Rfl: 3 .  famotidine (PEPCID) 20 MG tablet, Take 20 mg by mouth at bedtime., Disp: , Rfl:  .  finasteride (PROSCAR) 5 MG tablet, Take 5 mg by mouth daily., Disp: , Rfl:  .  gabapentin (NEURONTIN) 300 MG capsule, Take 300 mg by mouth 4 (four) times daily. , Disp: , Rfl:  .  hydrOXYzine (VISTARIL) 50 MG capsule, Take 50 mg by mouth at bedtime. , Disp: , Rfl:  .  metFORMIN (GLUCOPHAGE) 1000 MG tablet, Take 1,000 mg by mouth 2 (two) times a day. , Disp: , Rfl:  .  montelukast (SINGULAIR) 10 MG tablet, Take 10 mg by mouth daily. , Disp: , Rfl:  .  Multiple Vitamin (MULTIVITAMIN) tablet, Take 1 tablet by mouth daily., Disp: , Rfl:  .  pantoprazole (PROTONIX) 40 MG tablet, Take 1 tablet (40 mg total) by mouth daily., Disp: 30 tablet, Rfl: 1 .  potassium chloride SA (KLOR-CON) 20 MEQ tablet, Take 20 mEq by mouth daily. , Disp: , Rfl:  .  SYMBICORT 80-4.5 MCG/ACT inhaler, Inhale 2 puffs into the lungs daily as needed. , Disp: , Rfl:  .  tamsulosin (FLOMAX) 0.4 MG CAPS capsule, Take 0.4 mg by mouth daily., Disp: , Rfl:  .  torsemide  (DEMADEX) 20 MG tablet, Take 3 tablets (60 mg total) by mouth 2 (two) times daily., Disp: 180 tablet, Rfl: 3 .  VENTOLIN HFA 108 (90 Base) MCG/ACT inhaler, Inhale 2 puffs into the lungs every 6 (six) hours as needed for wheezing or shortness of breath. , Disp: , Rfl:  .  Accu-Chek FastClix Lancets MISC, USE UTD (Patient not taking: Reported on 03/30/2020), Disp: , Rfl:  .  ACCU-CHEK GUIDE test strip, U TO TEST BLOOD SUGAR QD (Patient not taking: Reported on 03/30/2020), Disp: , Rfl:  .  Blood Glucose Monitoring Suppl (ACCU-CHEK GUIDE) w/Device KIT, U TO TEST BLOOD SUGAR QD (Patient not taking: Reported on 03/30/2020), Disp: , Rfl:  .  Dextromethorphan HBr (ROBITUSSIN LINGERING COUGHGELS) 15 MG CAPS, Take 15 mg by mouth daily as needed (cough).  (Patient not taking: Reported on 03/16/2020), Disp: , Rfl:  Allergies  Allergen Reactions  . Cashew Nut Oil Palpitations  . Percocet [Oxycodone-Acetaminophen] Other (See Comments)    Hallucintaions      Social History   Socioeconomic History  . Marital status: Married    Spouse name: Not on file  . Number of children: Not on file  . Years of education: Not on file  . Highest education level: Not on file  Occupational History  . Not on file  Tobacco Use  . Smoking status: Never Smoker  . Smokeless tobacco: Never Used  Vaping Use  . Vaping Use: Never used  Substance and Sexual Activity  . Alcohol use: No  . Drug use: No  . Sexual activity: Not Currently    Partners: Female    Birth control/protection: None  Other Topics Concern  . Not on file  Social History Narrative   Patient lives in private residence with supportive spouse   Social Determinants of Health   Financial Resource Strain:   . Difficulty of Paying Living Expenses: Not on file  Food Insecurity:   . Worried About Charity fundraiser in the Last Year: Not on file  . Ran Out of Food in the Last Year: Not on file  Transportation Needs:   . Lack of Transportation (Medical):  Not on file  . Lack of Transportation (Non-Medical): Not on file  Physical Activity:   . Days of Exercise per Week: Not on file  . Minutes of Exercise per Session: Not on file  Stress:   . Feeling of Stress : Not on file  Social Connections:   . Frequency of Communication with Friends and Family: Not on file  . Frequency of Social Gatherings with Friends and Family: Not on file  . Attends Religious Services: Not on file  . Active Member of Clubs or Organizations: Not on file  . Attends Archivist Meetings: Not on file  . Marital Status: Not on file  Intimate Partner Violence:   . Fear of Current or Ex-Partner: Not on file  . Emotionally Abused: Not on file  . Physically Abused: Not on file  . Sexually Abused: Not on file  Physical Exam      Future Appointments  Date Time Provider [place]  04/05/2020 10:30 AM [place], Clent Demark, MD [facility] None  06/15/2020 11:30 AM Gardiner Barefoot, DPM [facility]    BP 122/70   Pulse 66   Temp 98.1 F (36.7 C)   Resp 18   Wt 204 lb (92.5 kg)   SpO2 97%   BMI 31.95 kg/m  CBG EMS-131 x Weight yesterday-202 Last visit weight-201  Pt reports he is doing well. He denies increased sob. However his weight is up 2lbs from yesterday.  He ate nacho cheese sauce on his macaroni last night and felt gassy. 1/4 cup of that cheese sauce is 560m of sodium. I told him he should not be using that at all.  He said past few days he has been seeing spots/letter A and rings. He goes back to eye doc next week.  He denies dizziness, no numbness/tingling. No c/p. Just gas last night.  Not checking his CBG's daily. Or really at all. I told him he should check it often.  meds reviewed-he has his own system in taking them.  I tested him on them and he passed with 100.  He is out of his torsemide and that was called in already.  I went ahead and called in the gabapentin, metformin and finasteride too since those have no refills and  since his PCP is hit or miss with refilling them. Sometimes he has to go down to the office to get them to send over refills.  Pt feels comfortable now with him managing his own meds.  Wife feels same.  At this time he can be d/c from paramedicine.     KMarylouise Stacks EFranklinCAdvanced Ambulatory Surgical Center IncParamedic  03/30/20

## 2020-04-05 ENCOUNTER — Encounter (INDEPENDENT_AMBULATORY_CARE_PROVIDER_SITE_OTHER): Payer: Self-pay | Admitting: Ophthalmology

## 2020-04-05 ENCOUNTER — Ambulatory Visit (INDEPENDENT_AMBULATORY_CARE_PROVIDER_SITE_OTHER): Payer: Medicare Other | Admitting: Ophthalmology

## 2020-04-05 ENCOUNTER — Other Ambulatory Visit: Payer: Self-pay

## 2020-04-05 DIAGNOSIS — H353221 Exudative age-related macular degeneration, left eye, with active choroidal neovascularization: Secondary | ICD-10-CM | POA: Diagnosis not present

## 2020-04-05 DIAGNOSIS — H353212 Exudative age-related macular degeneration, right eye, with inactive choroidal neovascularization: Secondary | ICD-10-CM

## 2020-04-05 DIAGNOSIS — E119 Type 2 diabetes mellitus without complications: Secondary | ICD-10-CM | POA: Insufficient documentation

## 2020-04-05 DIAGNOSIS — H353211 Exudative age-related macular degeneration, right eye, with active choroidal neovascularization: Secondary | ICD-10-CM | POA: Insufficient documentation

## 2020-04-05 MED ORDER — BEVACIZUMAB CHEMO INJECTION 1.25MG/0.05ML SYRINGE FOR KALEIDOSCOPE
1.2500 mg | INTRAVITREAL | Status: AC | PRN
  Administered 2020-04-05: 1.25 mg via INTRAVITREAL

## 2020-04-05 NOTE — Progress Notes (Signed)
04/05/2020     CHIEF COMPLAINT Patient presents for Retina Follow Up   HISTORY OF PRESENT ILLNESS: [name] is a 69 y.o. male who presents to the clinic today for:   HPI    Retina Follow Up    Patient presents with  Wet AMD.  In both eyes.  This started 7 months ago.  Severity is mild.  Duration of 7 months.  Since onset it is stable.          Comments    7 Month AMD F/U OU  Pt c/o floaters like "alphabet or pineapple slices" in [place] off and on. VA overall stable. No ocular pain reported OU. A1c: pt does not recall LBS: 131 a few days ago       Last edited by Rockie Neighbours, [place] on 04/05/2020 10:49 AM. (History)      Referring physician: Care, Jinny Blossom [address] [place],  [place] [address]  HISTORICAL INFORMATION:   Selected notes from the MEDICAL RECORD NUMBER    Lab Results  Component Value Date   HGBA1C 8.0 (H) 10/16/2018     CURRENT MEDICATIONS: No current outpatient medications on file. (Ophthalmic Drugs)   No current facility-administered medications for this visit. (Ophthalmic Drugs)   Current Outpatient Medications (Other)  Medication Sig  . Accu-Chek FastClix Lancets MISC USE UTD (Patient not taking: Reported on 03/30/2020)  . ACCU-CHEK GUIDE test strip U TO TEST BLOOD SUGAR QD (Patient not taking: Reported on 03/30/2020)  . aspirin EC 81 MG tablet Take 81 mg by mouth daily.  Marland Kitchen atorvastatin (LIPITOR) 40 MG tablet Take 1 tablet (40 mg total) by mouth every evening. (Patient taking differently: Take 40 mg by mouth every evening. )  . Blood Glucose Monitoring Suppl (ACCU-CHEK GUIDE) w/Device KIT U TO TEST BLOOD SUGAR QD (Patient not taking: Reported on 03/30/2020)  . carvedilol (COREG) 12.5 MG tablet TAKE ONE TABLET BY MOUTH TWICE A DAY  . Dextromethorphan HBr (ROBITUSSIN LINGERING COUGHGELS) 15 MG CAPS Take 15 mg by mouth daily as needed (cough).  (Patient not taking: Reported on 03/16/2020)  . famotidine  (PEPCID) 20 MG tablet Take 20 mg by mouth at bedtime.  . finasteride (PROSCAR) 5 MG tablet Take 5 mg by mouth daily.  Marland Kitchen gabapentin (NEURONTIN) 300 MG capsule Take 300 mg by mouth 4 (four) times daily.   . hydrOXYzine (VISTARIL) 50 MG capsule Take 50 mg by mouth at bedtime.   . metFORMIN (GLUCOPHAGE) 1000 MG tablet Take 1,000 mg by mouth 2 (two) times a day.   . montelukast (SINGULAIR) 10 MG tablet Take 10 mg by mouth daily.   . Multiple Vitamin (MULTIVITAMIN) tablet Take 1 tablet by mouth daily.  . pantoprazole (PROTONIX) 40 MG tablet Take 1 tablet (40 mg total) by mouth daily.  . potassium chloride SA (KLOR-CON) 20 MEQ tablet Take 20 mEq by mouth daily.   . SYMBICORT 80-4.5 MCG/ACT inhaler Inhale 2 puffs into the lungs daily as needed.   . tamsulosin (FLOMAX) 0.4 MG CAPS capsule Take 0.4 mg by mouth daily.  Marland Kitchen torsemide (DEMADEX) 20 MG tablet Take 3 tablets (60 mg total) by mouth 2 (two) times daily.  . VENTOLIN HFA 108 (90 Base) MCG/ACT inhaler Inhale 2 puffs into the lungs every 6 (six) hours as needed for wheezing or shortness of breath.    No current facility-administered medications for this visit. (Other)      REVIEW OF SYSTEMS:  ALLERGIES Allergies  Allergen Reactions  . Cashew Nut Oil Palpitations  . Percocet [Oxycodone-Acetaminophen] Other (See Comments)    Hallucintaions    PAST MEDICAL HISTORY Past Medical History:  Diagnosis Date  . Blockage of coronary artery of heart ([place]) 10/17/2011   wife states blocked 30%  . Diabetes mellitus 10/17/2011   newly dx today  . Edema leg    right leg has leaky valve and right foot swells  . Emphysema   . Excessive ear wax   . Fatty liver   . Hernia    near navel  . Hyperlipidemia   . Hypertension   . Leaky heart valve   . Neuropathy   . Rheumatic fever   . Slow urinary stream   . TIA (transient ischemic attack)    Past Surgical History:  Procedure Laterality Date  . CIRCUMCISION    . LITHOTRIPSY    . PRESSURE  SENSOR/CARDIOMEMS N/A 05/27/2019   Procedure: PRESSURE SENSOR/CARDIOMEMS;  Surgeon: Larey Dresser, MD;  Location: [facility];  Service: Cardiovascular;  Laterality: N/A;  . RIGHT HEART CATH N/A 05/27/2019   Procedure: RIGHT HEART CATH;  Surgeon: Larey Dresser, MD;  Location: [facility];  Service: Cardiovascular;  Laterality: N/A;    FAMILY HISTORY Family History  Problem Relation Age of Onset  . Emphysema Mother   . Aneurysm Mother   . Emphysema Father   . Coronary artery disease Brother   . Coronary artery disease Brother   . Coronary artery disease Sister     SOCIAL HISTORY Social History   Tobacco Use  . Smoking status: Never Smoker  . Smokeless tobacco: Never Used  Vaping Use  . Vaping Use: Never used  Substance Use Topics  . Alcohol use: No  . Drug use: No         OPHTHALMIC EXAM: Base Eye Exam    Visual Acuity (ETDRS)      Right Left   Dist [place] E card @ 2' 20/25 +2   Dist ph  NI        Tonometry (Tonopen, 10:49 AM)      Right Left   Pressure 12 12       Pupils      Dark Light Shape React APD   Right 1 1 Irregular Minimal None   Left 1 1 Round Minimal None       Visual Fields (Counting fingers)      Left Right    Full    Restrictions  Total superior nasal deficiency       Extraocular Movement      Right Left    Full Full       Neuro/Psych    Oriented x3: Yes   Mood/Affect: Normal       Dilation    Both eyes: 1.0% Mydriacyl, 2.5% Phenylephrine @ 10:53 AM        Slit Lamp and Fundus Exam    External Exam      Right Left   External Normal Normal       Slit Lamp Exam      Right Left   Lids/Lashes Normal Normal   Conjunctiva/Sclera White and quiet White and quiet   Cornea Clear Clear   Anterior Chamber Deep and quiet Deep and quiet   Iris Round and reactive Round and reactive   Anterior Vitreous Normal Normal       Fundus Exam      Right Left  Disc  Normal   C/D Ratio  0.45   Macula Disciform scar  Macular thickening, Subretinal hemorrhage          IMAGING AND PROCEDURES  Imaging and Procedures for 04/05/20  OCT, Retina - OU - Both Eyes       Right Eye Quality was good. Scan locations included subfoveal. Central Foveal Thickness: 480. Progression has been stable. Findings include cystoid macular edema, disciform scar.   Left Eye Quality was good. Scan locations included subfoveal. Central Foveal Thickness: 301. Progression has worsened.        Intravitreal Injection, Pharmacologic Agent - OS - Left Eye       Time Out 04/05/2020. 11:49 AM. Confirmed correct patient, procedure, site, and patient consented.   Anesthesia Topical anesthesia was used. Anesthetic medications included Akten 3.5%.   Procedure Preparation included Ofloxacin , 10% betadine to eyelids, 5% betadine to ocular surface. A 30 gauge needle was used.   Injection:  1.25 mg Bevacizumab (AVASTIN) SOLN   NDC: 70360-001-02, Lot: [id number]   Route: Intravitreal, Site: Left Eye, Waste: 0 mg  Post-op Post injection exam found visual acuity of at least counting fingers. The patient tolerated the procedure well. There were no complications. The patient received written and verbal post procedure care education. Post injection medications were not given.                 ASSESSMENT/PLAN:  Exudative age-related macular degeneration of left eye with active choroidal neovascularization ([place]) Will need to restart intravitreal Avastin OS today to preserve functionality and this was central monocular patient      ICD-10-CM   1. Exudative age-related macular degeneration of left eye with active choroidal neovascularization (HCC)  H35.3221 OCT, Retina - OU - Both Eyes    Intravitreal Injection, Pharmacologic Agent - OS - Left Eye    Bevacizumab (AVASTIN) SOLN 1.25 mg  2. Exudative age-related macular degeneration of right eye with inactive choroidal neovascularization ([place])  H35.3212   3. Diabetes mellitus  without complication (HCC)  S01.0     1.  2.  3.  Ophthalmic Meds Ordered this visit:  Meds ordered this encounter  Medications  . Bevacizumab (AVASTIN) SOLN 1.25 mg       Return in about 5 weeks (around 05/10/2020) for dilate, OS, AVASTIN OCT.  There are no Patient Instructions on file for this visit.   Explained the diagnoses, plan, and follow up with the patient and they expressed understanding.  Patient expressed understanding of the importance of proper follow up care.   Clent Demark Zamarion Longest M.D. Diseases & Surgery of the Retina and Vitreous Retina & Diabetic [place] 04/05/20     Abbreviations: M myopia (nearsighted); A astigmatism; H hyperopia (farsighted); P presbyopia; Mrx spectacle prescription;  CTL contact lenses; OD right eye; OS left eye; OU both eyes  XT exotropia; ET esotropia; PEK punctate epithelial keratitis; PEE punctate epithelial erosions; DES dry eye syndrome; MGD meibomian gland dysfunction; ATs artificial tears; PFAT's preservative free artificial tears; [place] nuclear sclerotic cataract; PSC posterior subcapsular cataract; ERM epi-retinal membrane; PVD posterior vitreous detachment; RD retinal detachment; DM diabetes mellitus; DR diabetic retinopathy; NPDR non-proliferative diabetic retinopathy; PDR proliferative diabetic retinopathy; CSME clinically significant macular edema; DME diabetic macular edema; dbh dot blot hemorrhages; CWS cotton wool spot; POAG primary open angle glaucoma; C/D cup-to-disc ratio; HVF humphrey visual field; GVF goldmann visual field; OCT optical coherence tomography; IOP intraocular pressure; BRVO Branch retinal vein occlusion; CRVO central retinal vein occlusion; CRAO central  retinal artery occlusion; BRAO branch retinal artery occlusion; RT retinal tear; SB scleral buckle; PPV pars plana vitrectomy; VH Vitreous hemorrhage; PRP panretinal laser photocoagulation; IVK intravitreal kenalog; VMT vitreomacular traction; MH Macular hole;  NVD  neovascularization of the disc; NVE neovascularization elsewhere; AREDS age related eye disease study; ARMD age related macular degeneration; POAG primary open angle glaucoma; EBMD epithelial/anterior basement membrane dystrophy; ACIOL anterior chamber intraocular lens; IOL intraocular lens; PCIOL posterior chamber intraocular lens; Phaco/IOL phacoemulsification with intraocular lens placement; [place] photorefractive keratectomy; LASIK laser assisted in situ keratomileusis; HTN hypertension; DM diabetes mellitus; COPD chronic obstructive pulmonary disease

## 2020-04-05 NOTE — Assessment & Plan Note (Signed)
Will need to restart intravitreal Avastin OS today to preserve functionality and this was central monocular patient

## 2020-04-14 ENCOUNTER — Telehealth (HOSPITAL_COMMUNITY): Payer: Self-pay

## 2020-04-14 MED ORDER — TORSEMIDE 20 MG PO TABS: ORAL_TABLET | ORAL | 3 refills | Status: DC

## 2020-04-14 MED ORDER — GABAPENTIN 300 MG PO CAPS: 300.0000 mg | ORAL_CAPSULE | Freq: Two times a day (BID) | ORAL | 3 refills | Status: DC

## 2020-04-14 MED ORDER — METFORMIN HCL 1000 MG PO TABS: 500.0000 mg | ORAL_TABLET | Freq: Two times a day (BID) | ORAL | 3 refills | Status: DC

## 2020-04-14 NOTE — Telephone Encounter (Signed)
Received fax of lab results from Cari Caraway FNP Office.  Per Dr Shirlee Latch: "his creatinine is not up that much from baseline, would take torsemide 60qam 40qpm with BMET again in 1 week"  Spoke to Patient and wife and advised. Pt states Cari Caraway changed several medications to the following doses:  Torsemide 40mg  qam 20mg  qpm Gabapentin 300mg  BID Metformin 500mg  BID  Pt states he is feeling a lot better and lost weight. Pt refuses to schedule lab appt due to feeling better and financial reasons. I advised the importance of the lab work and pt states they will continue current medication regimen and will call to schedule if they feel bad again. Med list updated. Will route to provider

## 2020-05-10 ENCOUNTER — Ambulatory Visit (INDEPENDENT_AMBULATORY_CARE_PROVIDER_SITE_OTHER): Payer: Medicare Other | Admitting: Ophthalmology

## 2020-05-10 ENCOUNTER — Other Ambulatory Visit: Payer: Self-pay

## 2020-05-10 ENCOUNTER — Encounter (INDEPENDENT_AMBULATORY_CARE_PROVIDER_SITE_OTHER): Payer: Self-pay | Admitting: Ophthalmology

## 2020-05-10 DIAGNOSIS — E119 Type 2 diabetes mellitus without complications: Secondary | ICD-10-CM

## 2020-05-10 DIAGNOSIS — H353221 Exudative age-related macular degeneration, left eye, with active choroidal neovascularization: Secondary | ICD-10-CM

## 2020-05-10 DIAGNOSIS — H2512 Age-related nuclear cataract, left eye: Secondary | ICD-10-CM | POA: Diagnosis not present

## 2020-05-10 MED ORDER — BEVACIZUMAB CHEMO INJECTION 1.25MG/0.05ML SYRINGE FOR KALEIDOSCOPE
1.2500 mg | INTRAVITREAL | Status: AC | PRN
  Administered 2020-05-10: 1.25 mg via INTRAVITREAL

## 2020-05-10 NOTE — Assessment & Plan Note (Signed)
Improved less subretinal fluid less macular edema secondary to recent Avastin OS, 5-week interval, repeat today

## 2020-05-10 NOTE — Progress Notes (Addendum)
05/10/2020     CHIEF COMPLAINT Patient presents for Retina Follow Up   HISTORY OF PRESENT ILLNESS: [name] is a 69 y.o. male who presents to the clinic today for:   HPI    Retina Follow Up    Patient presents with  Wet AMD.  In left eye.  This started 5 weeks ago.  Severity is mild.  Duration of 5 weeks.  Since onset it is stable.          Comments    5 Week AMD F/U OS, poss Avastin OS  Pt denies noticeable changes to [place] OU since last visit. Pt denies ocular pain, flashes of light, or floaters OU.  LBS: pt does not recall, "it was normal"        Last edited by Rockie Neighbours, [place] on 05/10/2020  9:24 AM. (History)      Referring physician: Care, Jinny Blossom [address] [place],  [place] [address]  HISTORICAL INFORMATION:   Selected notes from the MEDICAL RECORD NUMBER    Lab Results  Component Value Date   HGBA1C 8.0 (H) 10/16/2018     CURRENT MEDICATIONS: No current outpatient medications on file. (Ophthalmic Drugs)   No current facility-administered medications for this visit. (Ophthalmic Drugs)   Current Outpatient Medications (Other)  Medication Sig  . Accu-Chek FastClix Lancets MISC USE UTD (Patient not taking: Reported on 03/30/2020)  . ACCU-CHEK GUIDE test strip U TO TEST BLOOD SUGAR QD (Patient not taking: Reported on 03/30/2020)  . aspirin EC 81 MG tablet Take 81 mg by mouth daily.  Marland Kitchen atorvastatin (LIPITOR) 40 MG tablet Take 1 tablet (40 mg total) by mouth every evening. (Patient taking differently: Take 40 mg by mouth every evening. )  . Blood Glucose Monitoring Suppl (ACCU-CHEK GUIDE) w/Device KIT U TO TEST BLOOD SUGAR QD (Patient not taking: Reported on 03/30/2020)  . carvedilol (COREG) 12.5 MG tablet TAKE ONE TABLET BY MOUTH TWICE A DAY  . Dextromethorphan HBr (ROBITUSSIN LINGERING COUGHGELS) 15 MG CAPS Take 15 mg by mouth daily as needed (cough).  (Patient not taking: Reported on 03/16/2020)  . famotidine  (PEPCID) 20 MG tablet Take 20 mg by mouth at bedtime.  . finasteride (PROSCAR) 5 MG tablet Take 5 mg by mouth daily.  Marland Kitchen gabapentin (NEURONTIN) 300 MG capsule Take 1 capsule (300 mg total) by mouth 2 (two) times daily.  . hydrOXYzine (VISTARIL) 50 MG capsule Take 50 mg by mouth at bedtime.   . metFORMIN (GLUCOPHAGE) 1000 MG tablet Take 0.5 tablets (500 mg total) by mouth 2 (two) times daily with a meal.  . montelukast (SINGULAIR) 10 MG tablet Take 10 mg by mouth daily.   . Multiple Vitamin (MULTIVITAMIN) tablet Take 1 tablet by mouth daily.  . pantoprazole (PROTONIX) 40 MG tablet Take 1 tablet (40 mg total) by mouth daily.  . potassium chloride SA (KLOR-CON) 20 MEQ tablet Take 20 mEq by mouth daily.   . SYMBICORT 80-4.5 MCG/ACT inhaler Inhale 2 puffs into the lungs daily as needed.   . tamsulosin (FLOMAX) 0.4 MG CAPS capsule Take 0.4 mg by mouth daily.  Marland Kitchen torsemide (DEMADEX) 20 MG tablet Take 2 tablets (40 mg total) by mouth in the morning AND 1 tablet (20 mg total) every evening.  . VENTOLIN HFA 108 (90 Base) MCG/ACT inhaler Inhale 2 puffs into the lungs every 6 (six) hours as needed for wheezing or shortness of breath.    No current facility-administered  medications for this visit. (Other)      REVIEW OF SYSTEMS:    ALLERGIES Allergies  Allergen Reactions  . Cashew Nut Oil Palpitations  . Percocet [Oxycodone-Acetaminophen] Other (See Comments)    Hallucintaions    PAST MEDICAL HISTORY Past Medical History:  Diagnosis Date  . Blockage of coronary artery of heart ([place]) 10/17/2011   wife states blocked 30%  . Diabetes mellitus 10/17/2011   newly dx today  . Edema leg    right leg has leaky valve and right foot swells  . Emphysema   . Excessive ear wax   . Fatty liver   . Hernia    near navel  . Hyperlipidemia   . Hypertension   . Leaky heart valve   . Neuropathy   . Rheumatic fever   . Slow urinary stream   . TIA (transient ischemic attack)    Past Surgical History:    Procedure Laterality Date  . CIRCUMCISION    . LITHOTRIPSY    . PRESSURE SENSOR/CARDIOMEMS N/A 05/27/2019   Procedure: PRESSURE SENSOR/CARDIOMEMS;  Surgeon: Larey Dresser, MD;  Location: [facility];  Service: Cardiovascular;  Laterality: N/A;  . RIGHT HEART CATH N/A 05/27/2019   Procedure: RIGHT HEART CATH;  Surgeon: Larey Dresser, MD;  Location: [facility];  Service: Cardiovascular;  Laterality: N/A;    FAMILY HISTORY Family History  Problem Relation Age of Onset  . Emphysema Mother   . Aneurysm Mother   . Emphysema Father   . Coronary artery disease Brother   . Coronary artery disease Brother   . Coronary artery disease Sister     SOCIAL HISTORY Social History   Tobacco Use  . Smoking status: Never Smoker  . Smokeless tobacco: Never Used  Vaping Use  . Vaping Use: Never used  Substance Use Topics  . Alcohol use: No  . Drug use: No         OPHTHALMIC EXAM:  Base Eye Exam    Visual Acuity (ETDRS)      Right Left   Dist [place] E card @ 2' 20/30 -2   Dist ph [place] NI 20/25 +1       Tonometry (Tonopen, 9:18 AM)      Right Left   Pressure 15 13       Pupils      Dark Light Shape React APD   Right 1 1 Irregular Minimal None   Left 1 1 Round Minimal None       Visual Fields (Counting fingers)      Left Right    Full    Restrictions  Total superior nasal deficiency       Extraocular Movement      Right Left    Full Full       Neuro/Psych    Oriented x3: Yes   Mood/Affect: Normal       Dilation    Left eye: 1.0% Mydriacyl, 2.5% Phenylephrine @ 9:27 AM        Slit Lamp and Fundus Exam    External Exam      Right Left   External Normal Normal       Slit Lamp Exam      Right Left   Lids/Lashes Normal Normal   Conjunctiva/Sclera White and quiet White and quiet   Cornea Clear Clear   Anterior Chamber Deep and quiet Deep and quiet   Iris Round and reactive Round and reactive   Lens  2+ Nuclear sclerosis   Anterior Vitreous  Normal Normal       Fundus Exam      Right Left   Posterior Vitreous  Normal   Disc  Normal   C/D Ratio  0.45   Macula  Macular thickening,  No Subretinal hemorrhage   Vessels  Normal,,, no DR   Periphery  Normal          IMAGING AND PROCEDURES  Imaging and Procedures for 05/10/20  OCT, Retina - OU - Both Eyes       Right Eye Quality was good. Scan locations included subfoveal. Progression has been stable. Findings include intraretinal fluid, cystoid macular edema, choroidal neovascular membrane, subretinal scarring.   Left Eye Quality was good. Scan locations included subfoveal. Central Foveal Thickness: 260. Findings include abnormal foveal contour, no IRF, no SRF, subretinal scarring.   Notes OS now improved status post intravitreal Avastin for subretinal fluid CNVM recurrence, at 5-week interval.  We will repeat injection today and examination again in 5 weeks  OD with chronic disciform scar, not treatable       Intravitreal Injection, Pharmacologic Agent - OS - Left Eye       Time Out 05/10/2020. 10:01 AM. Confirmed correct patient, procedure, site, and patient consented.   Anesthesia Topical anesthesia was used. Anesthetic medications included Akten 3.5%.   Procedure Preparation included Tobramycin 0.3%. A 30 gauge needle was used.   Injection:  1.25 mg Bevacizumab (AVASTIN) SOLN   NDC: 70360-001-02, Lot: [id number]   Route: Intravitreal, Site: Left Eye, Waste: 0 mg  Post-op Post injection exam found visual acuity of at least counting fingers. The patient tolerated the procedure well. There were no complications. The patient received written and verbal post procedure care education. Post injection medications were not given.                 ASSESSMENT/PLAN:  Exudative age-related macular degeneration of left eye with active choroidal neovascularization (HCC) Improved less subretinal fluid less macular edema secondary to recent Avastin OS, 5-week  interval, repeat today  Diabetes mellitus type 2, controlled, without complications (HCC) The patient has diabetes without any evidence of retinopathy. The patient advised to maintain good blood glucose control, excellent blood pressure control, and favorable levels of cholesterol, low density lipoprotein, and high density lipoproteins. Follow up in 1 year was recommended. Explained that fluctuations in visual acuity , or "out of focus", may result from large variations of blood sugar control.  Age-related nuclear cataract, left The nature of cataract was discussed with the patient as well as the elective nature of surgery. The patient was reassured that surgery at a later date does not put the patient at risk for a worse outcome. It was emphasized that the need for surgery is dictated by the patient's quality of life as influenced by the cataract. Patient was instructed to maintain close follow up with their general eye care doctor.      ICD-10-CM   1. Exudative age-related macular degeneration of left eye with active choroidal neovascularization (HCC)  H35.3221 OCT, Retina - OU - Both Eyes    Intravitreal Injection, Pharmacologic Agent - OS - Left Eye    Bevacizumab (AVASTIN) SOLN 1.25 mg  2. Controlled type 2 diabetes mellitus without complication, without long-term current use of insulin (HCC)  E11.9   3. Age-related nuclear cataract, left  H25.12     1.  Recent reactivation of CNVM OS, now 5 weeks status post Avastin No. 1, repeat  intravitreal Avastin today OS and examination repeat in 5 weeks  2.  3.  Ophthalmic Meds Ordered this visit:  Meds ordered this encounter  Medications  . Bevacizumab (AVASTIN) SOLN 1.25 mg       Return in about 5 weeks (around 06/14/2020) for dilate, OS, AVASTIN OCT.  There are no Patient Instructions on file for this visit.   Explained the diagnoses, plan, and follow up with the patient and they expressed understanding.  Patient expressed  understanding of the importance of proper follow up care.   [name] M.D. Diseases & Surgery of the Retina and Vitreous Retina & Diabetic [place] 05/10/20     Abbreviations: M myopia (nearsighted); A astigmatism; H hyperopia (farsighted); P presbyopia; Mrx spectacle prescription;  CTL contact lenses; OD right eye; OS left eye; OU both eyes  XT exotropia; ET esotropia; PEK punctate epithelial keratitis; PEE punctate epithelial erosions; DES dry eye syndrome; MGD meibomian gland dysfunction; ATs artificial tears; PFAT's preservative free artificial tears; [place] nuclear sclerotic cataract; PSC posterior subcapsular cataract; ERM epi-retinal membrane; PVD posterior vitreous detachment; RD retinal detachment; DM diabetes mellitus; DR diabetic retinopathy; NPDR non-proliferative diabetic retinopathy; PDR proliferative diabetic retinopathy; CSME clinically significant macular edema; DME diabetic macular edema; dbh dot blot hemorrhages; CWS cotton wool spot; POAG primary open angle glaucoma; C/D cup-to-disc ratio; HVF humphrey visual field; GVF goldmann visual field; OCT optical coherence tomography; IOP intraocular pressure; BRVO Branch retinal vein occlusion; CRVO central retinal vein occlusion; CRAO central retinal artery occlusion; BRAO branch retinal artery occlusion; RT retinal tear; SB scleral buckle; PPV pars plana vitrectomy; VH Vitreous hemorrhage; PRP panretinal laser photocoagulation; IVK intravitreal kenalog; VMT vitreomacular traction; MH Macular hole;  NVD neovascularization of the disc; NVE neovascularization elsewhere; AREDS age related eye disease study; ARMD age related macular degeneration; POAG primary open angle glaucoma; EBMD epithelial/anterior basement membrane dystrophy; ACIOL anterior chamber intraocular lens; IOL intraocular lens; PCIOL posterior chamber intraocular lens; Phaco/IOL phacoemulsification with intraocular lens placement; [place] photorefractive keratectomy; LASIK laser  assisted in situ keratomileusis; HTN hypertension; DM diabetes mellitus; COPD chronic obstructive pulmonary disease

## 2020-05-10 NOTE — Assessment & Plan Note (Signed)

## 2020-05-10 NOTE — Assessment & Plan Note (Signed)

## 2020-05-11 ENCOUNTER — Encounter (INDEPENDENT_AMBULATORY_CARE_PROVIDER_SITE_OTHER): Payer: Medicare Other | Admitting: Ophthalmology

## 2020-05-20 ENCOUNTER — Telehealth (HOSPITAL_COMMUNITY): Payer: Self-pay | Admitting: Adult Health

## 2020-05-20 DIAGNOSIS — I5022 Chronic systolic (congestive) heart failure: Secondary | ICD-10-CM

## 2020-05-20 NOTE — Telephone Encounter (Signed)
Left message to return call 

## 2020-05-20 NOTE — Telephone Encounter (Signed)
Cardiomems reading 26 today.   Number is above his goal. Please call and instruct to take extra 20 mg torsemide for the next 2 days. Then go back 40 mg in am and 20 mg in pm.   Set up for BMET next week. Please call.

## 2020-05-23 ENCOUNTER — Telehealth (HOSPITAL_COMMUNITY): Payer: Self-pay | Admitting: Adult Health

## 2020-05-23 NOTE — Telephone Encounter (Signed)
Cardiomems Reading elevated.  Says he missed dose of torsemide last night.   Instructed to take an extra torsemide this evening.   He verbalized understanding .   Vilda Zollner NP-C  3:29 PM

## 2020-05-30 NOTE — Addendum Note (Signed)
Addended by: Samara Snide on: 05/30/2020 12:46 PM   Modules accepted: Orders

## 2020-05-30 NOTE — Telephone Encounter (Signed)
Patient advised and verbalized understanding. Lab orders placed and appt scheduled

## 2020-06-01 ENCOUNTER — Encounter (INDEPENDENT_AMBULATORY_CARE_PROVIDER_SITE_OTHER): Payer: Self-pay | Admitting: Otolaryngology

## 2020-06-01 ENCOUNTER — Other Ambulatory Visit: Payer: Self-pay

## 2020-06-01 ENCOUNTER — Ambulatory Visit (INDEPENDENT_AMBULATORY_CARE_PROVIDER_SITE_OTHER): Payer: Medicare Other | Admitting: Otolaryngology

## 2020-06-01 VITALS — Temp 97.3°F

## 2020-06-01 DIAGNOSIS — H903 Sensorineural hearing loss, bilateral: Secondary | ICD-10-CM | POA: Diagnosis not present

## 2020-06-01 DIAGNOSIS — H6123 Impacted cerumen, bilateral: Secondary | ICD-10-CM

## 2020-06-01 NOTE — Progress Notes (Signed)
HPI: [name] is a 69 y.o. male who presents for evaluation of hearing problems.  More recently has noticed increased hearing loss in the right ear.  He has not had a hearing test.  He apparently has had longstanding hearing problems over a number of years.  Past Medical History:  Diagnosis Date  . Blockage of coronary artery of heart ([place]) 10/17/2011   wife states blocked 30%  . Diabetes mellitus 10/17/2011   newly dx today  . Edema leg    right leg has leaky valve and right foot swells  . Emphysema   . Excessive ear wax   . Fatty liver   . Hernia    near navel  . Hyperlipidemia   . Hypertension   . Leaky heart valve   . Neuropathy   . Rheumatic fever   . Slow urinary stream   . TIA (transient ischemic attack)    Past Surgical History:  Procedure Laterality Date  . CIRCUMCISION    . LITHOTRIPSY    . PRESSURE SENSOR/CARDIOMEMS N/A 05/27/2019   Procedure: PRESSURE SENSOR/CARDIOMEMS;  Surgeon: Larey Dresser, MD;  Location: [facility];  Service: Cardiovascular;  Laterality: N/A;  . RIGHT HEART CATH N/A 05/27/2019   Procedure: RIGHT HEART CATH;  Surgeon: Larey Dresser, MD;  Location: [facility];  Service: Cardiovascular;  Laterality: N/A;   Social History   Socioeconomic History  . Marital status: Married    Spouse name: Not on file  . Number of children: Not on file  . Years of education: Not on file  . Highest education level: Not on file  Occupational History  . Not on file  Tobacco Use  . Smoking status: Never Smoker  . Smokeless tobacco: Never Used  Vaping Use  . Vaping Use: Never used  Substance and Sexual Activity  . Alcohol use: No  . Drug use: No  . Sexual activity: Not Currently    Partners: Female    Birth control/protection: None  Other Topics Concern  . Not on file  Social History Narrative   Patient lives in private residence with supportive spouse   Social Determinants of Health   Financial Resource Strain:   .  Difficulty of Paying Living Expenses: Not on file  Food Insecurity:   . Worried About Charity fundraiser in the Last Year: Not on file  . Ran Out of Food in the Last Year: Not on file  Transportation Needs:   . Lack of Transportation (Medical): Not on file  . Lack of Transportation (Non-Medical): Not on file  Physical Activity:   . Days of Exercise per Week: Not on file  . Minutes of Exercise per Session: Not on file  Stress:   . Feeling of Stress : Not on file  Social Connections:   . Frequency of Communication with Friends and Family: Not on file  . Frequency of Social Gatherings with Friends and Family: Not on file  . Attends Religious Services: Not on file  . Active Member of Clubs or Organizations: Not on file  . Attends Archivist Meetings: Not on file  . Marital Status: Not on file   Family History  Problem Relation Age of Onset  . Emphysema Mother   . Aneurysm Mother   . Emphysema Father   . Coronary artery disease Brother   . Coronary artery disease Brother   . Coronary artery disease Sister    Allergies  Allergen Reactions  . Cashew  Nut Oil Palpitations  . Percocet [Oxycodone-Acetaminophen] Other (See Comments)    Hallucintaions   Prior to Admission medications   Medication Sig Start Date End Date Taking? Authorizing Provider  Accu-Chek FastClix Lancets MISC USE UTD 01/23/19  Yes [provider]  ACCU-CHEK GUIDE test strip U TO TEST BLOOD SUGAR QD 01/22/19  Yes [provider]  aspirin EC 81 MG tablet Take 81 mg by mouth daily.   Yes [provider]  atorvastatin (LIPITOR) 40 MG tablet Take 1 tablet (40 mg total) by mouth every evening. Patient taking differently: Take 40 mg by mouth every evening.  08/05/19  Yes Larey Dresser, MD  Blood Glucose Monitoring Suppl (ACCU-CHEK GUIDE) w/Device KIT U TO TEST BLOOD SUGAR QD 01/22/19  Yes [provider]  carvedilol (COREG) 12.5 MG tablet TAKE ONE TABLET BY MOUTH TWICE A DAY  02/29/20  Yes Larey Dresser, MD  Dextromethorphan HBr (ROBITUSSIN LINGERING COUGHGELS) 15 MG CAPS Take 15 mg by mouth daily as needed (cough).    Yes [provider]  famotidine (PEPCID) 20 MG tablet Take 20 mg by mouth at bedtime.   Yes [provider]  finasteride (PROSCAR) 5 MG tablet Take 5 mg by mouth daily.   Yes [provider]  gabapentin (NEURONTIN) 300 MG capsule Take 1 capsule (300 mg total) by mouth 2 (two) times daily. 04/14/20  Yes Larey Dresser, MD  hydrOXYzine (VISTARIL) 50 MG capsule Take 50 mg by mouth at bedtime.    Yes [provider]  metFORMIN (GLUCOPHAGE) 1000 MG tablet Take 0.5 tablets (500 mg total) by mouth 2 (two) times daily with a meal. 04/14/20  Yes Larey Dresser, MD  montelukast (SINGULAIR) 10 MG tablet Take 10 mg by mouth daily.    Yes [provider]  Multiple Vitamin (MULTIVITAMIN) tablet Take 1 tablet by mouth daily.   Yes [provider]  pantoprazole (PROTONIX) 40 MG tablet Take 1 tablet (40 mg total) by mouth daily. 10/11/13  Yes Isaac Bliss, Rayford Halsted, MD  potassium chloride SA (KLOR-CON) 20 MEQ tablet Take 20 mEq by mouth daily.    Yes [provider]  SYMBICORT 80-4.5 MCG/ACT inhaler Inhale 2 puffs into the lungs daily as needed.  08/28/19  Yes [provider]  tamsulosin (FLOMAX) 0.4 MG CAPS capsule Take 0.4 mg by mouth daily. 05/03/15  Yes [provider]  torsemide (DEMADEX) 20 MG tablet Take 2 tablets (40 mg total) by mouth in the morning AND 1 tablet (20 mg total) every evening. 04/14/20  Yes Larey Dresser, MD  VENTOLIN HFA 108 [phone] Base) MCG/ACT inhaler Inhale 2 puffs into the lungs every 6 (six) hours as needed for wheezing or shortness of breath.  08/19/19  Yes [provider]     Positive ROS: Otherwise negative  All other systems have been reviewed and were otherwise negative with the exception of those mentioned in the HPI and as  above.  Physical Exam: Constitutional: Alert, well-appearing, no acute distress Ears: External ears without lesions or tenderness.  Right ear canal is completely occluded with cerumen that was removed with hydroperoxide and suction.  Left TM was partially occluded with cerumen that was cleaned with suction and curette.  TMs were clear bilaterally although the left TM was retracted. Nasal: External nose without lesions. . Clear nasal passages Oral: Lips and gums without lesions. Tongue and palate mucosa without lesions. Posterior oropharynx clear. Neck: No palpable adenopathy or masses Respiratory: Breathing comfortably  Skin: No facial/neck lesions or rash noted.  Cerumen impaction removal  Date/Time: 06/01/2020 11:43 AM Performed by: Rozetta Nunnery, MD Authorized by: Rozetta Nunnery, MD   Consent:    Consent obtained:  Verbal   Consent given by:  Patient   Risks discussed:  Pain and bleeding Procedure details:    Location:  L ear and R ear   Procedure type: curette and suction   Post-procedure details:    Inspection:  TM intact and canal normal   Hearing quality:  Improved   Patient tolerance of procedure:  Tolerated well, no immediate complications Comments:     Right ear canal was completely occluded with cerumen.  Left ear canal was partially occluded with cerumen.  TMs were clear otherwise.  Audiogram today demonstrated moderate severe hearing loss in both ears with type C tympanogram on the right side and a type B tympanogram on the left side.  Assessment: Bilateral moderate severe sensorineural hearing loss. Left ear eustachian tube dysfunction  Plan: Recommend use of Nasacort or Flonase as this will help some with congestion eustachian tube function. Would strongly recommend obtaining hearing aids as he has moderate severe bilateral sensorineural hearing loss  Radene Journey, MD

## 2020-06-06 ENCOUNTER — Other Ambulatory Visit: Payer: Self-pay

## 2020-06-06 ENCOUNTER — Ambulatory Visit (HOSPITAL_COMMUNITY)
Admission: RE | Admit: 2020-06-06 | Discharge: 2020-06-06 | Disposition: A | Discharge status: Home / Self Care | Payer: Medicare Other | Source: Ambulatory Visit | Attending: Internal Medicine | Admitting: Internal Medicine

## 2020-06-06 DIAGNOSIS — I5022 Chronic systolic (congestive) heart failure: Secondary | ICD-10-CM | POA: Insufficient documentation

## 2020-06-06 LAB — BASIC METABOLIC PANEL
Anion gap: 10 (ref 5–15)
BUN: 30 mg/dL — ABNORMAL HIGH (ref 8–23)
CO2: 29 mmol/L (ref 22–32)
Calcium: 8.8 mg/dL — ABNORMAL LOW (ref 8.9–10.3)
Chloride: 101 mmol/L (ref 98–111)
Creatinine, Ser: 1.53 mg/dL — ABNORMAL HIGH (ref 0.61–1.24)
GFR, Estimated: 49 mL/min — ABNORMAL LOW (ref >60)
Glucose, Bld: 193 mg/dL — ABNORMAL HIGH (ref 70–99)
Potassium: 3.8 mmol/L (ref 3.5–5.1)
Sodium: 140 mmol/L (ref 135–145)

## 2020-06-14 ENCOUNTER — Encounter (HOSPITAL_COMMUNITY): Payer: Self-pay | Admitting: Family Medicine

## 2020-06-14 ENCOUNTER — Inpatient Hospital Stay (HOSPITAL_COMMUNITY)
Admission: EM | Admit: 2020-06-14 | Discharge: 2020-06-19 | DRG: 521 | Disposition: A | Discharge status: Home Health | Payer: Medicare Other | Attending: Family Medicine | Admitting: Family Medicine

## 2020-06-14 ENCOUNTER — Other Ambulatory Visit: Payer: Self-pay

## 2020-06-14 ENCOUNTER — Emergency Department (HOSPITAL_COMMUNITY): Payer: Medicare Other

## 2020-06-14 DIAGNOSIS — Z885 Allergy status to narcotic agent status: Secondary | ICD-10-CM

## 2020-06-14 DIAGNOSIS — I130 Hypertensive heart and chronic kidney disease with heart failure and stage 1 through stage 4 chronic kidney disease, or unspecified chronic kidney disease: Secondary | ICD-10-CM | POA: Diagnosis present

## 2020-06-14 DIAGNOSIS — E1165 Type 2 diabetes mellitus with hyperglycemia: Secondary | ICD-10-CM | POA: Diagnosis present

## 2020-06-14 DIAGNOSIS — K219 Gastro-esophageal reflux disease without esophagitis: Secondary | ICD-10-CM | POA: Diagnosis present

## 2020-06-14 DIAGNOSIS — Z8249 Family history of ischemic heart disease and other diseases of the circulatory system: Secondary | ICD-10-CM

## 2020-06-14 DIAGNOSIS — N179 Acute kidney failure, unspecified: Secondary | ICD-10-CM | POA: Diagnosis present

## 2020-06-14 DIAGNOSIS — Z7952 Long term (current) use of systemic steroids: Secondary | ICD-10-CM

## 2020-06-14 DIAGNOSIS — W19XXXA Unspecified fall, initial encounter: Secondary | ICD-10-CM

## 2020-06-14 DIAGNOSIS — J189 Pneumonia, unspecified organism: Secondary | ICD-10-CM | POA: Diagnosis present

## 2020-06-14 DIAGNOSIS — K5900 Constipation, unspecified: Secondary | ICD-10-CM | POA: Diagnosis not present

## 2020-06-14 DIAGNOSIS — E1142 Type 2 diabetes mellitus with diabetic polyneuropathy: Secondary | ICD-10-CM | POA: Diagnosis present

## 2020-06-14 DIAGNOSIS — I5042 Chronic combined systolic (congestive) and diastolic (congestive) heart failure: Secondary | ICD-10-CM | POA: Diagnosis present

## 2020-06-14 DIAGNOSIS — Z7984 Long term (current) use of oral hypoglycemic drugs: Secondary | ICD-10-CM

## 2020-06-14 DIAGNOSIS — E785 Hyperlipidemia, unspecified: Secondary | ICD-10-CM | POA: Diagnosis present

## 2020-06-14 DIAGNOSIS — G4733 Obstructive sleep apnea (adult) (pediatric): Secondary | ICD-10-CM | POA: Diagnosis present

## 2020-06-14 DIAGNOSIS — N401 Enlarged prostate with lower urinary tract symptoms: Secondary | ICD-10-CM | POA: Diagnosis present

## 2020-06-14 DIAGNOSIS — S72012A Unspecified intracapsular fracture of left femur, initial encounter for closed fracture: Secondary | ICD-10-CM | POA: Diagnosis present

## 2020-06-14 DIAGNOSIS — R9431 Abnormal electrocardiogram [ECG] [EKG]: Secondary | ICD-10-CM | POA: Diagnosis not present

## 2020-06-14 DIAGNOSIS — E1122 Type 2 diabetes mellitus with diabetic chronic kidney disease: Secondary | ICD-10-CM | POA: Diagnosis present

## 2020-06-14 DIAGNOSIS — Z8673 Personal history of transient ischemic attack (TIA), and cerebral infarction without residual deficits: Secondary | ICD-10-CM

## 2020-06-14 DIAGNOSIS — Z91018 Allergy to other foods: Secondary | ICD-10-CM

## 2020-06-14 DIAGNOSIS — W010XXA Fall on same level from slipping, tripping and stumbling without subsequent striking against object, initial encounter: Secondary | ICD-10-CM | POA: Diagnosis present

## 2020-06-14 DIAGNOSIS — N1831 Chronic kidney disease, stage 3a: Secondary | ICD-10-CM | POA: Diagnosis present

## 2020-06-14 DIAGNOSIS — N183 Chronic kidney disease, stage 3 unspecified: Secondary | ICD-10-CM

## 2020-06-14 DIAGNOSIS — Z79899 Other long term (current) drug therapy: Secondary | ICD-10-CM

## 2020-06-14 DIAGNOSIS — S82122A Displaced fracture of lateral condyle of left tibia, initial encounter for closed fracture: Secondary | ICD-10-CM

## 2020-06-14 DIAGNOSIS — E119 Type 2 diabetes mellitus without complications: Secondary | ICD-10-CM

## 2020-06-14 DIAGNOSIS — R739 Hyperglycemia, unspecified: Secondary | ICD-10-CM | POA: Diagnosis not present

## 2020-06-14 DIAGNOSIS — S72002A Fracture of unspecified part of neck of left femur, initial encounter for closed fracture: Secondary | ICD-10-CM

## 2020-06-14 DIAGNOSIS — I5022 Chronic systolic (congestive) heart failure: Secondary | ICD-10-CM

## 2020-06-14 DIAGNOSIS — I872 Venous insufficiency (chronic) (peripheral): Secondary | ICD-10-CM | POA: Diagnosis present

## 2020-06-14 DIAGNOSIS — S72002G Fracture of unspecified part of neck of left femur, subsequent encounter for closed fracture with delayed healing: Secondary | ICD-10-CM | POA: Diagnosis not present

## 2020-06-14 DIAGNOSIS — D62 Acute posthemorrhagic anemia: Secondary | ICD-10-CM | POA: Diagnosis not present

## 2020-06-14 DIAGNOSIS — R338 Other retention of urine: Secondary | ICD-10-CM | POA: Diagnosis present

## 2020-06-14 DIAGNOSIS — Z20822 Contact with and (suspected) exposure to covid-19: Secondary | ICD-10-CM | POA: Diagnosis present

## 2020-06-14 DIAGNOSIS — Z7982 Long term (current) use of aspirin: Secondary | ICD-10-CM

## 2020-06-14 DIAGNOSIS — H353212 Exudative age-related macular degeneration, right eye, with inactive choroidal neovascularization: Secondary | ICD-10-CM | POA: Diagnosis present

## 2020-06-14 DIAGNOSIS — H353221 Exudative age-related macular degeneration, left eye, with active choroidal neovascularization: Secondary | ICD-10-CM | POA: Diagnosis present

## 2020-06-14 DIAGNOSIS — Z825 Family history of asthma and other chronic lower respiratory diseases: Secondary | ICD-10-CM

## 2020-06-14 DIAGNOSIS — I2510 Atherosclerotic heart disease of native coronary artery without angina pectoris: Secondary | ICD-10-CM | POA: Diagnosis present

## 2020-06-14 DIAGNOSIS — I712 Thoracic aortic aneurysm, without rupture: Secondary | ICD-10-CM | POA: Diagnosis present

## 2020-06-14 DIAGNOSIS — Z96652 Presence of left artificial knee joint: Secondary | ICD-10-CM

## 2020-06-14 DIAGNOSIS — I5023 Acute on chronic systolic (congestive) heart failure: Secondary | ICD-10-CM | POA: Diagnosis present

## 2020-06-14 DIAGNOSIS — I10 Essential (primary) hypertension: Secondary | ICD-10-CM

## 2020-06-14 DIAGNOSIS — K760 Fatty (change of) liver, not elsewhere classified: Secondary | ICD-10-CM | POA: Diagnosis present

## 2020-06-14 DIAGNOSIS — J439 Emphysema, unspecified: Secondary | ICD-10-CM | POA: Diagnosis present

## 2020-06-14 DIAGNOSIS — S72002D Fracture of unspecified part of neck of left femur, subsequent encounter for closed fracture with routine healing: Secondary | ICD-10-CM | POA: Diagnosis not present

## 2020-06-14 HISTORY — DX: Heart failure, unspecified: I50.9

## 2020-06-14 HISTORY — DX: Chronic kidney disease, unspecified: N18.9

## 2020-06-14 HISTORY — DX: Sleep apnea, unspecified: G47.30

## 2020-06-14 LAB — CBC WITH DIFFERENTIAL/PLATELET
Abs Immature Granulocytes: 0.09 10*3/uL — ABNORMAL HIGH (ref 0.00–0.07)
Basophils Absolute: 0.1 10*3/uL (ref 0.0–0.1)
Basophils Relative: 1 %
Eosinophils Absolute: 0.1 10*3/uL (ref 0.0–0.5)
Eosinophils Relative: 0 %
HCT: 36.1 % — ABNORMAL LOW (ref 39.0–52.0)
Hemoglobin: 11.4 g/dL — ABNORMAL LOW (ref 13.0–17.0)
Immature Granulocytes: 1 %
Lymphocytes Relative: 5 %
Lymphs Abs: 0.9 10*3/uL (ref 0.7–4.0)
MCH: 28.2 pg (ref 26.0–34.0)
MCHC: 31.6 g/dL (ref 30.0–36.0)
MCV: 89.4 fL (ref 80.0–100.0)
Monocytes Absolute: 0.5 10*3/uL (ref 0.1–1.0)
Monocytes Relative: 3 %
Neutro Abs: 15 10*3/uL — ABNORMAL HIGH (ref 1.7–7.7)
Neutrophils Relative %: 90 %
Platelets: 358 10*3/uL (ref 150–400)
RBC: 4.04 MIL/uL — ABNORMAL LOW (ref 4.22–5.81)
RDW: 13.6 % (ref 11.5–15.5)
WBC: 16.5 10*3/uL — ABNORMAL HIGH (ref 4.0–10.5)
nRBC: 0 % (ref 0.0–0.2)

## 2020-06-14 LAB — BASIC METABOLIC PANEL
Anion gap: 15 (ref 5–15)
BUN: 44 mg/dL — ABNORMAL HIGH (ref 8–23)
CO2: 24 mmol/L (ref 22–32)
Calcium: 8.7 mg/dL — ABNORMAL LOW (ref 8.9–10.3)
Chloride: 98 mmol/L (ref 98–111)
Creatinine, Ser: 1.94 mg/dL — ABNORMAL HIGH (ref 0.61–1.24)
GFR, Estimated: 37 mL/min — ABNORMAL LOW (ref >60)
Glucose, Bld: 257 mg/dL — ABNORMAL HIGH (ref 70–99)
Potassium: 3.7 mmol/L (ref 3.5–5.1)
Sodium: 137 mmol/L (ref 135–145)

## 2020-06-14 LAB — CBG MONITORING, ED
Glucose-Capillary: 166 mg/dL — ABNORMAL HIGH (ref 70–99)
Glucose-Capillary: 184 mg/dL — ABNORMAL HIGH (ref 70–99)
Glucose-Capillary: 218 mg/dL — ABNORMAL HIGH (ref 70–99)
Glucose-Capillary: 164 mg/dL — ABNORMAL HIGH (ref 70–99)

## 2020-06-14 LAB — CBC
HCT: 33.9 % — ABNORMAL LOW (ref 39.0–52.0)
Hemoglobin: 10.8 g/dL — ABNORMAL LOW (ref 13.0–17.0)
MCH: 28.7 pg (ref 26.0–34.0)
MCHC: 31.9 g/dL (ref 30.0–36.0)
MCV: 90.2 fL (ref 80.0–100.0)
Platelets: 329 10*3/uL (ref 150–400)
RBC: 3.76 MIL/uL — ABNORMAL LOW (ref 4.22–5.81)
RDW: 13.7 % (ref 11.5–15.5)
WBC: 12.6 10*3/uL — ABNORMAL HIGH (ref 4.0–10.5)
nRBC: 0 % (ref 0.0–0.2)

## 2020-06-14 LAB — RESP PANEL BY RT-PCR (FLU A&B, COVID) ARPGX2
Influenza A by PCR: NEGATIVE
Influenza B by PCR: NEGATIVE
SARS Coronavirus 2 by RT PCR: NEGATIVE

## 2020-06-14 LAB — PROTIME-INR
INR: 1 (ref 0.8–1.2)
Prothrombin Time: 12.3 seconds (ref 11.4–15.2)

## 2020-06-14 LAB — BRAIN NATRIURETIC PEPTIDE: B Natriuretic Peptide: 195.2 pg/mL — ABNORMAL HIGH (ref 0.0–100.0)

## 2020-06-14 LAB — ABO/RH: ABO/RH(D): A POS

## 2020-06-14 LAB — HEMOGLOBIN A1C
Hgb A1c MFr Bld: 7.8 % — ABNORMAL HIGH (ref 4.8–5.6)
Mean Plasma Glucose: 177.16 mg/dL

## 2020-06-14 LAB — TYPE AND SCREEN
ABO/RH(D): A POS
Antibody Screen: NEGATIVE

## 2020-06-14 LAB — CREATININE, SERUM
Creatinine, Ser: 1.63 mg/dL — ABNORMAL HIGH (ref 0.61–1.24)
GFR, Estimated: 45 mL/min — ABNORMAL LOW (ref >60)

## 2020-06-14 LAB — VITAMIN D 25 HYDROXY (VIT D DEFICIENCY, FRACTURES): Vit D, 25-Hydroxy: 38.91 ng/mL (ref 30–100)

## 2020-06-14 LAB — PROCALCITONIN: Procalcitonin: 0.13 ng/mL

## 2020-06-14 LAB — TSH: TSH: 1.833 u[IU]/mL (ref 0.350–4.500)

## 2020-06-14 LAB — HIV ANTIBODY (ROUTINE TESTING W REFLEX): HIV Screen 4th Generation wRfx: NONREACTIVE

## 2020-06-14 LAB — GLUCOSE, CAPILLARY: Glucose-Capillary: 159 mg/dL — ABNORMAL HIGH (ref 70–99)

## 2020-06-14 MED ORDER — HYDRALAZINE HCL 20 MG/ML IJ SOLN: 10.0000 mg | INTRAMUSCULAR | Status: DC | PRN

## 2020-06-14 MED ORDER — FENTANYL CITRATE (PF) 100 MCG/2ML IJ SOLN
50.0000 ug | INTRAMUSCULAR | Status: AC | PRN
  Administered 2020-06-14: 50 ug via INTRAVENOUS
  Filled 2020-06-14: qty 2

## 2020-06-14 MED ORDER — FLUTICASONE FUROATE-VILANTEROL 100-25 MCG/INH IN AEPB
1.0000 | INHALATION_SPRAY | Freq: Every day | RESPIRATORY_TRACT | Status: DC
  Administered 2020-06-16 – 2020-06-18 (×2): 1 via RESPIRATORY_TRACT
  Filled 2020-06-14 (×2): qty 28

## 2020-06-14 MED ORDER — ENSURE PRE-SURGERY PO LIQD
296.0000 mL | Freq: Once | ORAL | Status: AC
  Administered 2020-06-14: 296 mL via ORAL
  Filled 2020-06-14: qty 296

## 2020-06-14 MED ORDER — GABAPENTIN 400 MG PO CAPS
400.0000 mg | ORAL_CAPSULE | Freq: Two times a day (BID) | ORAL | Status: DC
  Administered 2020-06-14 – 2020-06-19 (×10): 400 mg via ORAL
  Filled 2020-06-14 (×10): qty 1

## 2020-06-14 MED ORDER — CEFAZOLIN SODIUM-DEXTROSE 2-4 GM/100ML-% IV SOLN: 2.0000 g | INTRAVENOUS | Status: DC

## 2020-06-14 MED ORDER — FINASTERIDE 5 MG PO TABS
5.0000 mg | ORAL_TABLET | Freq: Every day | ORAL | Status: DC
  Administered 2020-06-14 – 2020-06-19 (×5): 5 mg via ORAL
  Filled 2020-06-14 (×5): qty 1

## 2020-06-14 MED ORDER — FAMOTIDINE 20 MG PO TABS
20.0000 mg | ORAL_TABLET | Freq: Every day | ORAL | Status: DC
  Administered 2020-06-14 – 2020-06-18 (×5): 20 mg via ORAL
  Filled 2020-06-14 (×5): qty 1

## 2020-06-14 MED ORDER — TORSEMIDE 20 MG PO TABS
20.0000 mg | ORAL_TABLET | Freq: Two times a day (BID) | ORAL | Status: DC
  Administered 2020-06-14 – 2020-06-19 (×10): 20 mg via ORAL
  Filled 2020-06-14 (×11): qty 1

## 2020-06-14 MED ORDER — MUPIROCIN 2 % EX OINT: 1.0000 "application " | TOPICAL_OINTMENT | Freq: Two times a day (BID) | CUTANEOUS | Status: DC

## 2020-06-14 MED ORDER — SODIUM CHLORIDE 0.9 % IV SOLN
1.0000 g | Freq: Once | INTRAVENOUS | Status: AC
  Administered 2020-06-14: 1 g via INTRAVENOUS
  Filled 2020-06-14: qty 10

## 2020-06-14 MED ORDER — TRANEXAMIC ACID-NACL 1000-0.7 MG/100ML-% IV SOLN
1000.0000 mg | INTRAVENOUS | Status: AC
  Administered 2020-06-16: 1000 mg via INTRAVENOUS
  Filled 2020-06-14: qty 100

## 2020-06-14 MED ORDER — OXYCODONE HCL 5 MG PO TABS
5.0000 mg | ORAL_TABLET | ORAL | Status: DC | PRN
Start: 2020-06-14 — End: 2020-06-20
  Administered 2020-06-15 – 2020-06-19 (×5): 5 mg via ORAL
  Filled 2020-06-14 (×5): qty 1

## 2020-06-14 MED ORDER — FENTANYL CITRATE (PF) 100 MCG/2ML IJ SOLN
50.0000 ug | INTRAMUSCULAR | Status: DC | PRN
  Administered 2020-06-14: 50 ug via INTRAVENOUS
  Filled 2020-06-14: qty 2

## 2020-06-14 MED ORDER — SODIUM CHLORIDE 0.9 % IV SOLN
500.0000 mg | Freq: Once | INTRAVENOUS | Status: AC
  Administered 2020-06-14: 500 mg via INTRAVENOUS
  Filled 2020-06-14: qty 500

## 2020-06-14 MED ORDER — FENOFIBRATE 54 MG PO TABS
54.0000 mg | ORAL_TABLET | Freq: Every day | ORAL | Status: DC
  Administered 2020-06-14 – 2020-06-19 (×5): 54 mg via ORAL
  Filled 2020-06-14 (×6): qty 1

## 2020-06-14 MED ORDER — TAMSULOSIN HCL 0.4 MG PO CAPS
0.4000 mg | ORAL_CAPSULE | Freq: Every day | ORAL | Status: DC
  Administered 2020-06-14 – 2020-06-19 (×5): 0.4 mg via ORAL
  Filled 2020-06-14 (×5): qty 1

## 2020-06-14 MED ORDER — SODIUM CHLORIDE 0.9 % IV SOLN: 1.0000 g | INTRAVENOUS | Status: DC

## 2020-06-14 MED ORDER — HYDROMORPHONE HCL 1 MG/ML IJ SOLN
1.0000 mg | INTRAMUSCULAR | Status: DC | PRN
  Administered 2020-06-14 – 2020-06-17 (×6): 1 mg via INTRAVENOUS
  Filled 2020-06-14 (×7): qty 1

## 2020-06-14 MED ORDER — POVIDONE-IODINE 10 % EX SWAB
2.0000 "application " | Freq: Once | CUTANEOUS | Status: AC
  Administered 2020-06-16: 2 via TOPICAL

## 2020-06-14 MED ORDER — DOXYCYCLINE HYCLATE 100 MG PO TABS
100.0000 mg | ORAL_TABLET | Freq: Two times a day (BID) | ORAL | Status: DC
  Administered 2020-06-14 – 2020-06-19 (×10): 100 mg via ORAL
  Filled 2020-06-14 (×12): qty 1

## 2020-06-14 MED ORDER — ALBUTEROL SULFATE HFA 108 (90 BASE) MCG/ACT IN AERS: 2.0000 | INHALATION_SPRAY | Freq: Four times a day (QID) | RESPIRATORY_TRACT | Status: DC | PRN

## 2020-06-14 MED ORDER — METFORMIN HCL 500 MG PO TABS: 500.0000 mg | ORAL_TABLET | Freq: Two times a day (BID) | ORAL | Status: DC

## 2020-06-14 MED ORDER — CARVEDILOL 12.5 MG PO TABS
12.5000 mg | ORAL_TABLET | Freq: Two times a day (BID) | ORAL | Status: DC
  Administered 2020-06-14 – 2020-06-19 (×10): 12.5 mg via ORAL
  Filled 2020-06-14 (×5): qty 1
  Filled 2020-06-14 (×2): qty 4
  Filled 2020-06-14 (×5): qty 1

## 2020-06-14 MED ORDER — HEPARIN SODIUM (PORCINE) 5000 UNIT/ML IJ SOLN
5000.0000 [IU] | Freq: Three times a day (TID) | INTRAMUSCULAR | Status: DC
  Administered 2020-06-14 – 2020-06-15 (×4): 5000 [IU] via SUBCUTANEOUS
  Filled 2020-06-14 (×4): qty 1

## 2020-06-14 MED ORDER — SODIUM CHLORIDE 0.9 % IV SOLN
2.0000 g | INTRAVENOUS | Status: DC
  Administered 2020-06-15 – 2020-06-19 (×5): 2 g via INTRAVENOUS
  Filled 2020-06-14: qty 2
  Filled 2020-06-14: qty 20
  Filled 2020-06-14: qty 2
  Filled 2020-06-14: qty 20
  Filled 2020-06-14: qty 2
  Filled 2020-06-14: qty 20

## 2020-06-14 MED ORDER — CHLORHEXIDINE GLUCONATE 4 % EX LIQD
60.0000 mL | Freq: Once | CUTANEOUS | Status: DC
  Filled 2020-06-14: qty 60

## 2020-06-14 MED ORDER — INSULIN ASPART 100 UNIT/ML ~~LOC~~ SOLN
0.0000 [IU] | Freq: Three times a day (TID) | SUBCUTANEOUS | Status: DC
  Administered 2020-06-14: 5 [IU] via SUBCUTANEOUS
  Administered 2020-06-15: 3 [IU] via SUBCUTANEOUS
  Administered 2020-06-15: 5 [IU] via SUBCUTANEOUS
  Administered 2020-06-15: 2 [IU] via SUBCUTANEOUS
  Administered 2020-06-17 – 2020-06-19 (×6): 3 [IU] via SUBCUTANEOUS
  Administered 2020-06-19 (×2): 2 [IU] via SUBCUTANEOUS

## 2020-06-14 MED ORDER — DM-GUAIFENESIN ER 30-600 MG PO TB12
1.0000 | ORAL_TABLET | Freq: Two times a day (BID) | ORAL | Status: DC | PRN
  Administered 2020-06-14 – 2020-06-15 (×2): 1 via ORAL
  Filled 2020-06-14 (×3): qty 1

## 2020-06-14 MED ORDER — HYDROCODONE-ACETAMINOPHEN 5-325 MG PO TABS
1.0000 | ORAL_TABLET | Freq: Four times a day (QID) | ORAL | Status: DC | PRN
  Administered 2020-06-14: 2 via ORAL
  Administered 2020-06-14: 1 via ORAL
  Administered 2020-06-15 – 2020-06-19 (×12): 2 via ORAL
  Filled 2020-06-14 (×7): qty 2
  Filled 2020-06-14: qty 1
  Filled 2020-06-14 (×7): qty 2

## 2020-06-14 MED ORDER — SENNOSIDES-DOCUSATE SODIUM 8.6-50 MG PO TABS: 1.0000 | ORAL_TABLET | Freq: Every evening | ORAL | Status: DC | PRN

## 2020-06-14 MED ORDER — ATORVASTATIN CALCIUM 40 MG PO TABS
40.0000 mg | ORAL_TABLET | Freq: Every evening | ORAL | Status: DC
  Administered 2020-06-14 – 2020-06-19 (×5): 40 mg via ORAL
  Filled 2020-06-14 (×6): qty 1

## 2020-06-14 MED ORDER — INSULIN ASPART 100 UNIT/ML ~~LOC~~ SOLN
0.0000 [IU] | Freq: Every day | SUBCUTANEOUS | Status: DC
  Administered 2020-06-15: 5 [IU] via SUBCUTANEOUS
  Administered 2020-06-16: 2 [IU] via SUBCUTANEOUS

## 2020-06-14 NOTE — Progress Notes (Signed)
69 year old with history of DM2, HTN, CHF, CAD, [name] stage III admitted after a fall suffering from left-sided hip fracture.  Although we do not currently have been reviewed also diagnosed with possible community-acquired pneumonia therefore started on doxycycline.  Has prolonged QTC on EKG.  Seen and examined in the ED reporting of some productive coughing.  Also reports of left hip pain.  No other complaints at this time.  His vital signs are overall stable. Not in any acute distress, 2 L nasal cannula.  Bibasilar rhonchi.  Abdomen is nontender nondistended.  His lab work have been reviewed.  Left femoral neck fracture-plans for OR on Wednesday per orthopedic.  In the meantime pain control, bowel regimen  Community-acquired pneumonia-check procalcitonin level.  Infiltrate seen on chest x-ray with elevated WBC.  Elevated WBC could also be reactive.  Currently on doxycycline.  Incentive spirometer, flutter valve when out of bed to chair.  Bronchodilators as needed.  Prolonged QTC-repeat EKG  Urinary retention-bladder scan as needed.  Diabetes mellitus type 2-insulin sliding scale and Accu-Chek, Metformin on hold.  Congestive heart failure with reduced ejection fraction, EF 45%, grade 1 DD-appears euvolemic.  Closely monitor.  Continue home medicines  Peripheral neuropathy-gabapentin  DVT prophylaxis-subcu heparin   Time spent-15 minutes  Stephania Fragmin MD [facility]

## 2020-06-14 NOTE — ED Triage Notes (Signed)
Pt arrived via [facility] from home after falling 3 hours ago after going to bathroom. L leg is shortened and externally rotated. EMS gave pt of fentanyl in route.

## 2020-06-14 NOTE — Progress Notes (Signed)
PT Cancellation Note  Patient Details Name: [name] MRN: [id number] DOB: Oct 28, 1950   Cancelled Treatment:    Reason Eval/Treat Not Completed: Medical issues which prohibited therapy Pt with femoral neck fx and awaiting surgery. Will hold until pt medically appropriate and follow up as schedule allows.   Cindee Salt, DPT  Acute Rehabilitation Services  Pager: [phone] Office: [phone]    [name] 06/14/2020, 9:29 AM

## 2020-06-14 NOTE — ED Notes (Signed)
Related to pts urinary retention and hip fracture, Dr Rachael Darby okay to place foley

## 2020-06-14 NOTE — Consult Note (Signed)
Reason for Consult:   Left femoral neck fracture Referring Physician: ED Physcian  [name] is an 69 y.o. male.  HPI: [name] is a 69 y.o. male who presented to the ER via EMS for left hip pain after a fall at home.  He reports he was walking to the house when he tripped over a ethernet cord and fell landing on his left hip.  He was not able to stand up and sat on the floor for approximately 3 hours before EMS was called.  His wife was home with him, but he thought he just sat and rested he be able to get up but after a few hours with the pain continuing he was not able to stand up his wife went ahead and called EMS.  He denies any chest pain or pressure.  He does not have any head trauma or loss of consciousness.  He does report having a dry cough for the last day.  He states he has not had any fever or chills. He is found to have a left femoral neck fracture on x-ray.  Ortho was consulted.  The need for surgical repair of the hip was discussed.  Risks, benefits and expectations were discussed with the patient.  Risks including but not limited to the risk of anesthesia, blood clots, nerve damage, blood vessel damage, failure of the prosthesis, infection and up to and including death.  Patient understand the risks, benefits and expectations and wishes to proceed with surgery.    Past Medical History:  Diagnosis Date  . 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 CHF (congestive heart failure) ([facility])   . Chronic kidney disease   . Diabetes mellitus 10/17/2011   newly dx today  . Edema leg    right leg has leaky valve and right foot swells  . Emphysema   . Excessive ear wax   . Fatty liver   . Hernia    near navel  . Hyperlipidemia   . Hypertension   . Leaky heart valve   . Neuropathy   . Rheumatic fever   . Sleep apnea   . Slow urinary stream   . TIA (transient ischemic attack)     Past Surgical History:  Procedure Laterality Date  .  CIRCUMCISION    . LITHOTRIPSY    . PRESSURE SENSOR/CARDIOMEMS N/A 05/27/2019   Procedure: PRESSURE SENSOR/CARDIOMEMS;  Surgeon: Laurey Morale, MD;  Location: [facility];  Service: Cardiovascular;  Laterality: N/A;  . RIGHT HEART CATH N/A 05/27/2019   Procedure: RIGHT HEART CATH;  Surgeon: Laurey Morale, MD;  Location: [facility];  Service: Cardiovascular;  Laterality: N/A;    Family History  Problem Relation Age of Onset  . Emphysema Mother   . Aneurysm Mother   . Emphysema Father   . Coronary artery disease Brother   . Coronary artery disease Brother   . Coronary artery disease Sister     Social History:  reports that he has never smoked. He has never used smokeless tobacco. He reports that he does not drink alcohol and does not use drugs.  Allergies:  Allergies  Allergen Reactions  . Cashew Nut Oil Palpitations  . Percocet [Oxycodone-Acetaminophen] Other (See Comments)    Hallucintaions     Results for orders placed or performed during the hospital encounter of 06/14/20 (from the past 48 hour(s))  Type and screen [facility]  Status: None   Collection Time: 06/14/20  1:25 AM  Result Value Ref Range   ABO/RH(D) A POS    Antibody Screen NEG    Sample Expiration      06/17/2020,2359 Performed at [facility], [address] [place], [place] [address]   Basic metabolic panel     Status: Abnormal   Collection Time: 06/14/20  1:33 AM  Result Value Ref Range   Sodium 137 135 - 145 mmol/L   Potassium 3.7 3.5 - 5.1 mmol/L   Chloride 98 98 - 111 mmol/L   CO2 24 22 - 32 mmol/L   Glucose, Bld 257 (H) 70 - 99 mg/dL    Comment: Glucose reference range applies only to samples taken after fasting for at least 8 hours.   BUN 44 (H) 8 - 23 mg/dL   Creatinine, Ser 8.67 (H) 0.61 - 1.24 mg/dL   Calcium 8.7 (L) 8.9 - 10.3 mg/dL   GFR, Estimated 37 (L) >60 mL/min    Comment: (NOTE) Calculated using the [name]-EPI Creatinine Equation  (2021)    Anion gap 15 5 - 15    Comment: Performed at [facility], [address], [place], [place] [address]  CBC WITH DIFFERENTIAL     Status: Abnormal   Collection Time: 06/14/20  1:33 AM  Result Value Ref Range   WBC 16.5 (H) 4.0 - 10.5 K/uL   RBC 4.04 (L) 4.22 - 5.81 MIL/uL   Hemoglobin 11.4 (L) 13.0 - 17.0 g/dL   HCT 47.0 (L) 96.2 - 83.6 %   MCV 89.4 80.0 - 100.0 fL   MCH 28.2 26.0 - 34.0 pg   MCHC 31.6 30.0 - 36.0 g/dL   RDW 62.9 47.6 - 54.6 %   Platelets 358 150 - 400 K/uL   nRBC 0.0 0.0 - 0.2 %   Neutrophils Relative % 90 %   Neutro Abs 15.0 (H) 1.7 - 7.7 K/uL   Lymphocytes Relative 5 %   Lymphs Abs 0.9 0.7 - 4.0 K/uL   Monocytes Relative 3 %   Monocytes Absolute 0.5 0.1 - 1.0 K/uL   Eosinophils Relative 0 %   Eosinophils Absolute 0.1 0.0 - 0.5 K/uL   Basophils Relative 1 %   Basophils Absolute 0.1 0.0 - 0.1 K/uL   Immature Granulocytes 1 %   Abs Immature Granulocytes 0.09 (H) 0.00 - 0.07 K/uL    Comment: Performed at [facility], [address] [place], [place] [address]  Protime-INR     Status: None   Collection Time: 06/14/20  1:33 AM  Result Value Ref Range   Prothrombin Time 12.3 11.4 - 15.2 seconds   INR 1.0 0.8 - 1.2    Comment: (NOTE) INR goal varies based on device and disease states. Performed at [facility], [address], [place], [place] [address]   Brain natriuretic peptide     Status: Abnormal   Collection Time: 06/14/20  1:33 AM  Result Value Ref Range   B Natriuretic Peptide 195.2 (H) 0.0 - 100.0 pg/mL    Comment: Performed at [facility], [address] [place], [place] [address]  Resp Panel by RT-PCR (Flu A&B, Covid) Nasopharyngeal Swab     Status: None   Collection Time: 06/14/20  2:28 AM   Specimen: Nasopharyngeal Swab; Nasopharyngeal(NP) swabs in vial transport medium  Result Value Ref Range   SARS Coronavirus 2 by RT PCR NEGATIVE NEGATIVE    Comment: (NOTE) SARS-CoV-2  target nucleic acids are NOT  DETECTED.  The SARS-CoV-2 RNA is generally detectable in upper respiratory specimens during the acute phase of infection. The lowest concentration of SARS-CoV-2 viral copies this assay can detect is 138 copies/mL. A negative result does not preclude SARS-Cov-2 infection and should not be used as the sole basis for treatment or other patient management decisions. A negative result may occur with  improper specimen collection/handling, submission of specimen other than nasopharyngeal swab, presence of viral mutation(s) within the areas targeted by this assay, and inadequate number of viral copies(<138 copies/mL). A negative result must be combined with clinical observations, patient history, and epidemiological information. The expected result is Negative.  Fact Sheet for Patients:  BloggerCourse.com  Fact Sheet for Healthcare Providers:  SeriousBroker.it  This test is no t yet approved or cleared by the [place] FDA and  has been authorized for detection and/or diagnosis of SARS-CoV-2 by FDA under an Emergency Use Authorization (EUA). This EUA will remain  in effect (meaning this test can be used) for the duration of the COVID-19 declaration under Section 564(b)(1) of the Act, 21 U.S.C.section 360bbb-3(b)(1), unless the authorization is terminated  or revoked sooner.       Influenza A by PCR NEGATIVE NEGATIVE   Influenza B by PCR NEGATIVE NEGATIVE    Comment: (NOTE) The Xpert Xpress SARS-CoV-2/FLU/RSV plus assay is intended as an aid in the diagnosis of influenza from Nasopharyngeal swab specimens and should not be used as a sole basis for treatment. Nasal washings and aspirates are unacceptable for Xpert Xpress SARS-CoV-2/FLU/RSV testing.  Fact Sheet for Patients: BloggerCourse.com  Fact Sheet for Healthcare Providers: SeriousBroker.it  This test is not yet approved or  cleared by the [place] FDA and has been authorized for detection and/or diagnosis of SARS-CoV-2 by FDA under an Emergency Use Authorization (EUA). This EUA will remain in effect (meaning this test can be used) for the duration of the COVID-19 declaration under Section 564(b)(1) of the Act, 21 U.S.C. section 360bbb-3(b)(1), unless the authorization is terminated or revoked.  Performed at [facility], [address] [place], [place] [address]   Hemoglobin A1c     Status: Abnormal   Collection Time: 06/14/20  7:27 AM  Result Value Ref Range   Hgb A1c MFr Bld 7.8 (H) 4.8 - 5.6 %    Comment: (NOTE) Pre diabetes:          5.7%-6.4%  Diabetes:              >6.4%  Glycemic control for   <7.0% adults with diabetes    Mean Plasma Glucose 177.16 mg/dL    Comment: Performed at [facility], [address], [place], [place] [address]  CBC     Status: Abnormal   Collection Time: 06/14/20  7:27 AM  Result Value Ref Range   WBC 12.6 (H) 4.0 - 10.5 K/uL   RBC 3.76 (L) 4.22 - 5.81 MIL/uL   Hemoglobin 10.8 (L) 13.0 - 17.0 g/dL   HCT 32.6 (L) 71.2 - 45.8 %   MCV 90.2 80.0 - 100.0 fL   MCH 28.7 26.0 - 34.0 pg   MCHC 31.9 30.0 - 36.0 g/dL   RDW 09.9 83.3 - 82.5 %   Platelets 329 150 - 400 K/uL   nRBC 0.0 0.0 - 0.2 %    Comment: Performed at [facility], [address], [place], [place] [address]  Creatinine, serum     Status: Abnormal  Collection Time: 06/14/20  7:27 AM  Result Value Ref Range   Creatinine, Ser 1.63 (H) 0.61 - 1.24 mg/dL   GFR, Estimated 45 (L) >60 mL/min    Comment: (NOTE) Calculated using the [name]-EPI Creatinine Equation (2021) Performed at [facility], [address], [place], [place] [address]   CBG monitoring, ED     Status: Abnormal   Collection Time: 06/14/20  7:47 AM  Result Value Ref Range   Glucose-Capillary 166 (H) 70 - 99 mg/dL    Comment: Glucose reference range applies only to samples taken after fasting for at least 8  hours.    DG Chest 1 View  Result Date: 06/14/2020 CLINICAL DATA:  Left hip pain after fall EXAM: CHEST  1 VIEW COMPARISON:  02/25/2019 FINDINGS: Cardiomegaly. Patchy bilateral airspace disease. Mild vascular congestion. No effusions or pneumothorax. No acute bony abnormality. IMPRESSION: Cardiomegaly, vascular congestion. Patchy bilateral airspace disease concerning for pneumonia. Electronically Signed   By: Charlett Nose M.D.   On: 06/14/2020 02:18   DG Hip Unilat W or Wo Pelvis 2-3 Views Left  Result Date: 06/14/2020 CLINICAL DATA:  Fall, left hip pain EXAM: DG HIP (WITH OR WITHOUT PELVIS) 2-3V LEFT COMPARISON:  None. FINDINGS: There is a left femoral neck fracture with angulation. No subluxation or dislocation. SI joints symmetric and unremarkable. IMPRESSION: Angulated left femoral neck fracture. Electronically Signed   By: Charlett Nose M.D.   On: 06/14/2020 02:13    Review of Systems  Constitutional: Negative.   HENT: Negative.   Eyes: Negative.        Some loss of sight in the right eye  Respiratory: Positive for cough.   Cardiovascular: Negative.   Gastrointestinal: Negative.   Genitourinary: Negative.   Musculoskeletal: Positive for joint pain.  Skin: Negative.   Neurological: Negative.   Endo/Heme/Allergies: Negative.   Psychiatric/Behavioral: Negative.     Blood pressure 128/90, pulse 87, temperature 98 F (36.7 C), temperature source Oral, resp. rate 18, SpO2 98 %. Physical Exam Constitutional:      Appearance: He is well-developed.  HENT:     Head: Normocephalic.     Mouth/Throat:     Dentition: Abnormal dentition (poor).  Eyes:     Pupils: Pupils are equal, round, and reactive to light.  Neck:     Thyroid: No thyromegaly.     Vascular: No JVD.     Trachea: No tracheal deviation.  Cardiovascular:     Rate and Rhythm: Normal rate and regular rhythm.     Pulses: Intact distal pulses.  Pulmonary:     Effort: Pulmonary effort is normal. No respiratory  distress.     Breath sounds: Normal breath sounds. No wheezing.  Abdominal:     Palpations: Abdomen is soft.     Tenderness: There is no abdominal tenderness. There is no guarding.  Musculoskeletal:     Cervical back: Neck supple.     Left hip: Tenderness and bony tenderness present. Decreased range of motion. Decreased strength.  Lymphadenopathy:     Cervical: No cervical adenopathy.  Skin:    General: Skin is warm and dry.  Neurological:     Mental Status: He is alert and oriented to person, place, and time.  Psychiatric:        Mood and Affect: Mood and affect normal.     Assessment/Plan: Left femoral neck fracture   Plan:   Discussed the need for surgery  Risks, benefits and expectations were discussed with the patient.  Risks including but not limited to the risk of anesthesia, blood clots, nerve damage, blood vessel damage, failure of the prosthesis, infection and up to and including death.  Patient understand the risks, benefits and expectations and wishes to proceed with surgery.   Dr. Charlann Boxerlin talked to Dr. Linna CapriceSwinteck who plans on bringing the patient to the OR tomorrow to perform a THA  Orders were placed  NPO after midnight     Genelle GatherMatthew Scott Center For Digestive Health And Pain ManagementBabish 06/14/2020, 9:12 AM

## 2020-06-14 NOTE — ED Notes (Signed)
Lunch Tray Ordered @ 1048. °

## 2020-06-14 NOTE — ED Provider Notes (Signed)
[facility] EMERGENCY DEPARTMENT Provider Note   CSN: [id number] Arrival date & time: 06/14/20  0109     History Chief Complaint  Patient presents with  . Fall    [name] is a 69 y.o. male.  The history is provided by the patient.  Fall This is a new problem. The current episode started 3 to 5 hours ago. The problem occurs constantly. The problem has not changed since onset.Pertinent negatives include no chest pain, no headaches and no shortness of breath. Exacerbated by: movement. Nothing relieves the symptoms.  Patient with extensive history including diabetes, CAD, emphysema presents after a fall.  Patient reports he tripped over a cord and fell landing on his left side.  He reports pain in his left hip.  Denies any head injury or LOC.  No neck or back pain.  Denies any chest pain.      Past Medical History:  Diagnosis Date  . Blockage of coronary artery of heart ([place]) 10/17/2011   wife states blocked 30%  . Diabetes mellitus 10/17/2011   newly dx today  . Edema leg    right leg has leaky valve and right foot swells  . Emphysema   . Excessive ear wax   . Fatty liver   . Hernia    near navel  . Hyperlipidemia   . Hypertension   . Leaky heart valve   . Neuropathy   . Rheumatic fever   . Slow urinary stream   . TIA (transient ischemic attack)     Patient Active Problem List   Diagnosis Date Noted  . Age-related nuclear cataract, left 05/10/2020  . Exudative age-related macular degeneration of left eye with active choroidal neovascularization ([place]) 04/05/2020  . Exudative age-related macular degeneration of right eye with inactive choroidal neovascularization ([place]) 04/05/2020  . Diabetes mellitus without complication ([place]) 57/32/2025  . Hallux limitus of left foot 06/05/2019  . Hallux limitus of right foot 06/05/2019  . Seasonal allergies 02/12/2019  . Health care maintenance 02/12/2019  . GERD (gastroesophageal reflux disease) 02/12/2019   . Nocturnal hypoxemia 02/12/2019  . OSA (obstructive sleep apnea) 02/12/2019  . Pain due to onychomycosis of toenails of both feet 01/30/2019  . Lobar pneumonia ([place]) 12/29/2018  . Diabetes mellitus type 2, controlled, without complications ([place]) 42/70/6237  . Thoracic aortic aneurysm ([place]) 12/29/2018  . Acute on chronic combined systolic and diastolic CHF (congestive heart failure) ([place]) 12/25/2018  . Syncope 12/25/2018  . Chronic systolic heart failure ([place]) 11/19/2018  . Chronic venous insufficiency 06/24/2018  . Respiratory failure ([place]) 06/01/2018  . Leg swelling 05/06/2018  . Acute respiratory failure with hypoxemia ([place]) 10/14/2017  . Influenza-like illness 10/14/2017  . Delirium 10/14/2017  . Acute urinary retention 10/14/2017  . (HFpEF) heart failure with preserved ejection fraction ([place]), pulmonary edema and LBBB of unknown chronicity  10/29/2016  . Elevated troponin I level, presume demand ischemia  10/29/2016  . AKI (acute kidney injury) ([place]) 10/29/2016  . BPH (benign prostatic hyperplasia) 10/29/2016  . Sepsis ([place]) 10/29/2016  . Bacteremia due to Escherichia coli 10/29/2016  . Fatty liver 10/29/2016  . Acute pulmonary edema (HCC)   . Acute hypoxic and hypercarbic respiratory failure (HCC) in setting of bilateral pulmonary infiltrates. Presume CAP + pulmonary edema +/-ALI 10/27/2016  . Left-sided weakness 05/04/2015  . TIA (transient ischemic attack) : Rule out. 05/04/2015  . Hyperlipidemia 05/04/2015  . Chest pain 10/10/2013  . SOB (shortness of breath) 10/10/2013  . Hyperglycemia without ketosis 10/17/2011  .  HTN (hypertension) 10/17/2011  . Foot drop, left 10/17/2011  . Impacted cerumen of right ear 10/17/2011  . Dyspnea 10/17/2011  . History of TIA (transient ischemic attack) 10/17/2011  . Neuropathy ([place]) 10/17/2011  . Aortic insufficiency 03/16/2011  . Chest pain 03/16/2011    Past Surgical History:  Procedure Laterality Date  . CIRCUMCISION    .  LITHOTRIPSY    . PRESSURE SENSOR/CARDIOMEMS N/A 05/27/2019   Procedure: PRESSURE SENSOR/CARDIOMEMS;  Surgeon: Larey Dresser, MD;  Location: [facility];  Service: Cardiovascular;  Laterality: N/A;  . RIGHT HEART CATH N/A 05/27/2019   Procedure: RIGHT HEART CATH;  Surgeon: Larey Dresser, MD;  Location: [facility];  Service: Cardiovascular;  Laterality: N/A;       Family History  Problem Relation Age of Onset  . Emphysema Mother   . Aneurysm Mother   . Emphysema Father   . Coronary artery disease Brother   . Coronary artery disease Brother   . Coronary artery disease Sister     Social History   Tobacco Use  . Smoking status: Never Smoker  . Smokeless tobacco: Never Used  Vaping Use  . Vaping Use: Never used  Substance Use Topics  . Alcohol use: No  . Drug use: No    Home Medications Prior to Admission medications   Medication Sig Start Date End Date Taking? Authorizing Provider  Accu-Chek FastClix Lancets MISC USE UTD 01/23/19   [provider]  ACCU-CHEK GUIDE test strip U TO TEST BLOOD SUGAR QD 01/22/19   [provider]  aspirin EC 81 MG tablet Take 81 mg by mouth daily.    [provider]  atorvastatin (LIPITOR) 40 MG tablet Take 1 tablet (40 mg total) by mouth every evening. Patient taking differently: Take 40 mg by mouth every evening.  08/05/19   Larey Dresser, MD  Blood Glucose Monitoring Suppl (ACCU-CHEK GUIDE) w/Device KIT U TO TEST BLOOD SUGAR QD 01/22/19   [provider]  carvedilol (COREG) 12.5 MG tablet TAKE ONE TABLET BY MOUTH TWICE A DAY 02/29/20   Larey Dresser, MD  Dextromethorphan HBr (ROBITUSSIN LINGERING COUGHGELS) 15 MG CAPS Take 15 mg by mouth daily as needed (cough).     [provider]  famotidine (PEPCID) 20 MG tablet Take 20 mg by mouth at bedtime.    [provider]  finasteride (PROSCAR) 5 MG tablet Take 5 mg by mouth daily.    [provider]  gabapentin  (NEURONTIN) 300 MG capsule Take 1 capsule (300 mg total) by mouth 2 (two) times daily. 04/14/20   Larey Dresser, MD  hydrOXYzine (VISTARIL) 50 MG capsule Take 50 mg by mouth at bedtime.     [provider]  metFORMIN (GLUCOPHAGE) 1000 MG tablet Take 0.5 tablets (500 mg total) by mouth 2 (two) times daily with a meal. 04/14/20   Larey Dresser, MD  montelukast (SINGULAIR) 10 MG tablet Take 10 mg by mouth daily.     [provider]  Multiple Vitamin (MULTIVITAMIN) tablet Take 1 tablet by mouth daily.    [provider]  pantoprazole (PROTONIX) 40 MG tablet Take 1 tablet (40 mg total) by mouth daily. 10/11/13   Isaac Bliss, Rayford Halsted, MD  potassium chloride SA (KLOR-CON) 20 MEQ tablet Take 20 mEq by mouth daily.     [provider]  SYMBICORT 80-4.5 MCG/ACT inhaler Inhale 2 puffs into the lungs daily as needed.  08/28/19   [provider]  tamsulosin (FLOMAX) 0.4 MG CAPS capsule Take 0.4 mg by mouth daily. 05/03/15   [provider]  torsemide (DEMADEX) 20 MG tablet Take 2 tablets (40 mg total) by mouth in the morning AND 1 tablet (20 mg total) every evening. 04/14/20   Larey Dresser, MD  VENTOLIN HFA 108 [phone] Base) MCG/ACT inhaler Inhale 2 puffs into the lungs every 6 (six) hours as needed for wheezing or shortness of breath.  08/19/19   [provider]    Allergies    Cashew nut oil and Percocet [oxycodone-acetaminophen]  Review of Systems   Review of Systems  Constitutional: Negative for fever.  Respiratory: Positive for cough. Negative for shortness of breath.   Cardiovascular: Negative for chest pain.  Musculoskeletal: Positive for arthralgias and myalgias. Negative for back pain and neck pain.  Neurological: Negative for headaches.  All other systems reviewed and are negative.   Physical Exam Updated Vital Signs BP (!) 143/95   Pulse (!) 103   Temp 98.5 F (36.9 C) (Oral)   Resp 18   SpO2 92%   Physical  Exam CONSTITUTIONAL: Elderly, chronically ill-appearing HEAD: Normocephalic/atraumatic EYES: EOMI/PERRL ENMT: Mucous membranes moist NECK: supple no meningeal signs SPINE/BACK:entire spine nontender CV: S1/S2 noted, no murmurs/rubs/gallops noted LUNGS: Lungs are clear to auscultation bilaterally, no apparent distress ABDOMEN: soft, obese, reducible umbilical hernia GU:no cva tenderness NEURO: Pt is awake/alert/appropriate, moves all extremitiesx4.  No facial droop.   EXTREMITIES: Left lower extremity is shortened and rotated.  Tenderness noted to left hip.  Distal pulses intact. All other extremities/joints palpated/ranged and nontender SKIN: warm, color normal PSYCH: no abnormalities of mood noted, alert and oriented to situation  ED Results / Procedures / Treatments   Labs (all labs ordered are listed, but only abnormal results are displayed) Labs Reviewed  BASIC METABOLIC PANEL - Abnormal; Notable for the following components:      Result Value   Glucose, Bld 257 (*)    BUN 44 (*)    Creatinine, Ser 1.94 (*)    Calcium 8.7 (*)    GFR, Estimated 37 (*)    All other components within normal limits  CBC WITH DIFFERENTIAL/PLATELET - Abnormal; Notable for the following components:   WBC 16.5 (*)    RBC 4.04 (*)    Hemoglobin 11.4 (*)    HCT 36.1 (*)    Neutro Abs 15.0 (*)    Abs Immature Granulocytes 0.09 (*)    All other components within normal limits  RESP PANEL BY RT-PCR (FLU A&B, COVID) ARPGX2  PROTIME-INR  BRAIN NATRIURETIC PEPTIDE  TYPE AND SCREEN  ABO/RH    EKG EKG Interpretation  Date/Time:  Tuesday June 14 2020 01:57:39 EST Ventricular Rate:  97 PR Interval:    QRS Duration: 164 QT Interval:  419 QTC Calculation: 533 R Axis:   7 Text Interpretation: Sinus or ectopic atrial rhythm Left bundle branch block Confirmed by Ripley Fraise [phone]) on 06/14/2020 2:08:49 AM   Radiology DG Chest 1 View  Result Date: 06/14/2020 CLINICAL DATA:  Left hip  pain after fall EXAM: CHEST  1 VIEW COMPARISON:  02/25/2019 FINDINGS: Cardiomegaly. Patchy bilateral airspace disease. Mild vascular congestion. No effusions or pneumothorax. No acute bony abnormality. IMPRESSION: Cardiomegaly, vascular congestion. Patchy bilateral airspace disease concerning for pneumonia. Electronically Signed   By: Rolm Baptise M.D.   On: 06/14/2020 02:18   DG Hip Unilat W or Wo Pelvis 2-3 Views Left  Result Date: 06/14/2020 CLINICAL DATA:  Fall, left  hip pain EXAM: DG HIP (WITH OR WITHOUT PELVIS) 2-3V LEFT COMPARISON:  None. FINDINGS: There is a left femoral neck fracture with angulation. No subluxation or dislocation. SI joints symmetric and unremarkable. IMPRESSION: Angulated left femoral neck fracture. Electronically Signed   By: Rolm Baptise M.D.   On: 06/14/2020 02:13    Procedures Procedures    Medications Ordered in ED Medications  fentaNYL (SUBLIMAZE) injection 50 mcg (has no administration in time range)  cefTRIAXone (ROCEPHIN) 1 g in sodium chloride 0.9 % 100 mL IVPB (has no administration in time range)  azithromycin (ZITHROMAX) 500 mg in sodium chloride 0.9 % 250 mL IVPB (has no administration in time range)    ED Course  I have reviewed the triage vital signs and the nursing notes.  Pertinent labs & imaging results that were available during my care of the patient were reviewed by me and considered in my medical decision making (see chart for details).    MDM Rules/Calculators/A&P                          2:53 AM Patient with multiple medical conditions presents after a fall. Patient sustained a left femoral neck fracture. He denies any other pain or any other traumatic injury. Patient has an abnormal chest x-ray as well he reports recent cough. He will be screened for COVID-19 and also added on a BNP. Wife was updated via phone 3:12 AM Discussed with Dr. Roxy Manns with orthopedics. Patient will be admitted to the medical service and Ortho will be  consulting. He does not expect surgery to occur prior to December 15 3:50 AM Discussed with Dr. Tonie Griffith for admission due to elevated white count and abnormal chest x-ray, will treat for pneumonia while needing operative management of hip fracture  Final Clinical Impression(s) / ED Diagnoses Final diagnoses:  Left displaced femoral neck fracture ([place])  Fall, initial encounter  Hyperglycemia  AKI (acute kidney injury) [facility])    Rx / DC Orders ED Discharge Orders    None       Ripley Fraise, MD 06/14/20 [phone]

## 2020-06-14 NOTE — ED Notes (Signed)
Pt placed on condom cath 

## 2020-06-14 NOTE — ED Notes (Signed)
50 mcg of fentanyl unable to be wasted in pyxis. [place], RN, Press photographer witnessed waste.

## 2020-06-14 NOTE — Progress Notes (Signed)
Asked by Dr. Charlann Boxer to help coordinate care. Patient remains in ED due to lack of beds. [facility] OR unavailable tomorrow. Plan for surgery at [facility]. Spoke with [facility] regarding transfer. NPO after MN. Hold chemical DVT ppx. Full consult to follow.

## 2020-06-14 NOTE — ED Notes (Signed)
Called [facility] for transportation to [facility]

## 2020-06-14 NOTE — Progress Notes (Signed)
Pt. s/u with CPAP per order, auto mode, using 2 lpm oxygen, placed lg ffmask, made aware to notify if needed.

## 2020-06-14 NOTE — ED Notes (Signed)
Dinner Tray Ordered @ 1657. 

## 2020-06-14 NOTE — ED Notes (Signed)
[facility] did not give a time for transport, simply placed patient on the list.

## 2020-06-14 NOTE — H&P (View-Only) (Signed)
Reason for Consult:   Left femoral neck fracture Referring Physician: ED Physcian  [name] is an 69 y.o. male.  HPI: [name] is a 69 y.o. male who presented to the ER via EMS for left hip pain after a fall at home.  He reports he was walking to the house when he tripped over a ethernet cord and fell landing on his left hip.  He was not able to stand up and sat on the floor for approximately 3 hours before EMS was called.  His wife was home with him, but he thought he just sat and rested he be able to get up but after a few hours with the pain continuing he was not able to stand up his wife went ahead and called EMS.  He denies any chest pain or pressure.  He does not have any head trauma or loss of consciousness.  He does report having a dry cough for the last day.  He states he has not had any fever or chills. He is found to have a left femoral neck fracture on x-ray.  Ortho was consulted.  The need for surgical repair of the hip was discussed.  Risks, benefits and expectations were discussed with the patient.  Risks including but not limited to the risk of anesthesia, blood clots, nerve damage, blood vessel damage, failure of the prosthesis, infection and up to and including death.  Patient understand the risks, benefits and expectations and wishes to proceed with surgery.    Past Medical History:  Diagnosis Date  . 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 CHF (congestive heart failure) (HCC)   . Chronic kidney disease   . Diabetes mellitus 10/17/2011   newly dx today  . Edema leg    right leg has leaky valve and right foot swells  . Emphysema   . Excessive ear wax   . Fatty liver   . Hernia    near navel  . Hyperlipidemia   . Hypertension   . Leaky heart valve   . Neuropathy   . Rheumatic fever   . Sleep apnea   . Slow urinary stream   . TIA (transient ischemic attack)     Past Surgical History:  Procedure Laterality Date  .  CIRCUMCISION    . LITHOTRIPSY    . PRESSURE SENSOR/CARDIOMEMS N/A 05/27/2019   Procedure: PRESSURE SENSOR/CARDIOMEMS;  Surgeon: McLean, Dalton S, MD;  Location: [facility];  Service: Cardiovascular;  Laterality: N/A;  . RIGHT HEART CATH N/A 05/27/2019   Procedure: RIGHT HEART CATH;  Surgeon: McLean, Dalton S, MD;  Location: [facility];  Service: Cardiovascular;  Laterality: N/A;    Family History  Problem Relation Age of Onset  . Emphysema Mother   . Aneurysm Mother   . Emphysema Father   . Coronary artery disease Brother   . Coronary artery disease Brother   . Coronary artery disease Sister     Social History:  reports that he has never smoked. He has never used smokeless tobacco. He reports that he does not drink alcohol and does not use drugs.  Allergies:  Allergies  Allergen Reactions  . Cashew Nut Oil Palpitations  . Percocet [Oxycodone-Acetaminophen] Other (See Comments)    Hallucintaions     Results for orders placed or performed during the hospital encounter of 06/14/20 (from the past 48 hour(s))  Type and screen [facility]       Status: None   Collection Time: 06/14/20  1:25 AM  Result Value Ref Range   ABO/RH(D) A POS    Antibody Screen NEG    Sample Expiration      06/17/2020,2359 Performed at [facility], [address] [place], [place] [address]   Basic metabolic panel     Status: Abnormal   Collection Time: 06/14/20  1:33 AM  Result Value Ref Range   Sodium 137 135 - 145 mmol/L   Potassium 3.7 3.5 - 5.1 mmol/L   Chloride 98 98 - 111 mmol/L   CO2 24 22 - 32 mmol/L   Glucose, Bld 257 (H) 70 - 99 mg/dL    Comment: Glucose reference range applies only to samples taken after fasting for at least 8 hours.   BUN 44 (H) 8 - 23 mg/dL   Creatinine, Ser 8.67 (H) 0.61 - 1.24 mg/dL   Calcium 8.7 (L) 8.9 - 10.3 mg/dL   GFR, Estimated 37 (L) >60 mL/min    Comment: (NOTE) Calculated using the [name]-EPI Creatinine Equation  (2021)    Anion gap 15 5 - 15    Comment: Performed at [facility], [address], [place], [place] [address]  CBC WITH DIFFERENTIAL     Status: Abnormal   Collection Time: 06/14/20  1:33 AM  Result Value Ref Range   WBC 16.5 (H) 4.0 - 10.5 K/uL   RBC 4.04 (L) 4.22 - 5.81 MIL/uL   Hemoglobin 11.4 (L) 13.0 - 17.0 g/dL   HCT 47.0 (L) 96.2 - 83.6 %   MCV 89.4 80.0 - 100.0 fL   MCH 28.2 26.0 - 34.0 pg   MCHC 31.6 30.0 - 36.0 g/dL   RDW 62.9 47.6 - 54.6 %   Platelets 358 150 - 400 K/uL   nRBC 0.0 0.0 - 0.2 %   Neutrophils Relative % 90 %   Neutro Abs 15.0 (H) 1.7 - 7.7 K/uL   Lymphocytes Relative 5 %   Lymphs Abs 0.9 0.7 - 4.0 K/uL   Monocytes Relative 3 %   Monocytes Absolute 0.5 0.1 - 1.0 K/uL   Eosinophils Relative 0 %   Eosinophils Absolute 0.1 0.0 - 0.5 K/uL   Basophils Relative 1 %   Basophils Absolute 0.1 0.0 - 0.1 K/uL   Immature Granulocytes 1 %   Abs Immature Granulocytes 0.09 (H) 0.00 - 0.07 K/uL    Comment: Performed at [facility], [address] [place], [place] [address]  Protime-INR     Status: None   Collection Time: 06/14/20  1:33 AM  Result Value Ref Range   Prothrombin Time 12.3 11.4 - 15.2 seconds   INR 1.0 0.8 - 1.2    Comment: (NOTE) INR goal varies based on device and disease states. Performed at [facility], [address], [place], [place] [address]   Brain natriuretic peptide     Status: Abnormal   Collection Time: 06/14/20  1:33 AM  Result Value Ref Range   B Natriuretic Peptide 195.2 (H) 0.0 - 100.0 pg/mL    Comment: Performed at [facility], [address] [place], [place] [address]  Resp Panel by RT-PCR (Flu A&B, Covid) Nasopharyngeal Swab     Status: None   Collection Time: 06/14/20  2:28 AM   Specimen: Nasopharyngeal Swab; Nasopharyngeal(NP) swabs in vial transport medium  Result Value Ref Range   SARS Coronavirus 2 by RT PCR NEGATIVE NEGATIVE    Comment: (NOTE) SARS-CoV-2  target nucleic acids are NOT  DETECTED.  The SARS-CoV-2 RNA is generally detectable in upper respiratory specimens during the acute phase of infection. The lowest concentration of SARS-CoV-2 viral copies this assay can detect is 138 copies/mL. A negative result does not preclude SARS-Cov-2 infection and should not be used as the sole basis for treatment or other patient management decisions. A negative result may occur with  improper specimen collection/handling, submission of specimen other than nasopharyngeal swab, presence of viral mutation(s) within the areas targeted by this assay, and inadequate number of viral copies(<138 copies/mL). A negative result must be combined with clinical observations, patient history, and epidemiological information. The expected result is Negative.  Fact Sheet for Patients:  BloggerCourse.com  Fact Sheet for Healthcare Providers:  SeriousBroker.it  This test is no t yet approved or cleared by the [place] FDA and  has been authorized for detection and/or diagnosis of SARS-CoV-2 by FDA under an Emergency Use Authorization (EUA). This EUA will remain  in effect (meaning this test can be used) for the duration of the COVID-19 declaration under Section 564(b)(1) of the Act, 21 U.S.C.section 360bbb-3(b)(1), unless the authorization is terminated  or revoked sooner.       Influenza A by PCR NEGATIVE NEGATIVE   Influenza B by PCR NEGATIVE NEGATIVE    Comment: (NOTE) The Xpert Xpress SARS-CoV-2/FLU/RSV plus assay is intended as an aid in the diagnosis of influenza from Nasopharyngeal swab specimens and should not be used as a sole basis for treatment. Nasal washings and aspirates are unacceptable for Xpert Xpress SARS-CoV-2/FLU/RSV testing.  Fact Sheet for Patients: BloggerCourse.com  Fact Sheet for Healthcare Providers: SeriousBroker.it  This test is not yet approved or  cleared by the [place] FDA and has been authorized for detection and/or diagnosis of SARS-CoV-2 by FDA under an Emergency Use Authorization (EUA). This EUA will remain in effect (meaning this test can be used) for the duration of the COVID-19 declaration under Section 564(b)(1) of the Act, 21 U.S.C. section 360bbb-3(b)(1), unless the authorization is terminated or revoked.  Performed at [facility], [address] [place], [place] [address]   Hemoglobin A1c     Status: Abnormal   Collection Time: 06/14/20  7:27 AM  Result Value Ref Range   Hgb A1c MFr Bld 7.8 (H) 4.8 - 5.6 %    Comment: (NOTE) Pre diabetes:          5.7%-6.4%  Diabetes:              >6.4%  Glycemic control for   <7.0% adults with diabetes    Mean Plasma Glucose 177.16 mg/dL    Comment: Performed at [facility], [address], [place], [place] [address]  CBC     Status: Abnormal   Collection Time: 06/14/20  7:27 AM  Result Value Ref Range   WBC 12.6 (H) 4.0 - 10.5 K/uL   RBC 3.76 (L) 4.22 - 5.81 MIL/uL   Hemoglobin 10.8 (L) 13.0 - 17.0 g/dL   HCT 32.6 (L) 71.2 - 45.8 %   MCV 90.2 80.0 - 100.0 fL   MCH 28.7 26.0 - 34.0 pg   MCHC 31.9 30.0 - 36.0 g/dL   RDW 09.9 83.3 - 82.5 %   Platelets 329 150 - 400 K/uL   nRBC 0.0 0.0 - 0.2 %    Comment: Performed at [facility], [address], [place], [place] [address]  Creatinine, serum     Status: Abnormal  Collection Time: 06/14/20  7:27 AM  Result Value Ref Range   Creatinine, Ser 1.63 (H) 0.61 - 1.24 mg/dL   GFR, Estimated 45 (L) >60 mL/min    Comment: (NOTE) Calculated using the [name]-EPI Creatinine Equation (2021) Performed at [facility], [address], [place], [place] [address]   CBG monitoring, ED     Status: Abnormal   Collection Time: 06/14/20  7:47 AM  Result Value Ref Range   Glucose-Capillary 166 (H) 70 - 99 mg/dL    Comment: Glucose reference range applies only to samples taken after fasting for at least 8  hours.    DG Chest 1 View  Result Date: 06/14/2020 CLINICAL DATA:  Left hip pain after fall EXAM: CHEST  1 VIEW COMPARISON:  02/25/2019 FINDINGS: Cardiomegaly. Patchy bilateral airspace disease. Mild vascular congestion. No effusions or pneumothorax. No acute bony abnormality. IMPRESSION: Cardiomegaly, vascular congestion. Patchy bilateral airspace disease concerning for pneumonia. Electronically Signed   By: Charlett Nose M.D.   On: 06/14/2020 02:18   DG Hip Unilat W or Wo Pelvis 2-3 Views Left  Result Date: 06/14/2020 CLINICAL DATA:  Fall, left hip pain EXAM: DG HIP (WITH OR WITHOUT PELVIS) 2-3V LEFT COMPARISON:  None. FINDINGS: There is a left femoral neck fracture with angulation. No subluxation or dislocation. SI joints symmetric and unremarkable. IMPRESSION: Angulated left femoral neck fracture. Electronically Signed   By: Charlett Nose M.D.   On: 06/14/2020 02:13    Review of Systems  Constitutional: Negative.   HENT: Negative.   Eyes: Negative.        Some loss of sight in the right eye  Respiratory: Positive for cough.   Cardiovascular: Negative.   Gastrointestinal: Negative.   Genitourinary: Negative.   Musculoskeletal: Positive for joint pain.  Skin: Negative.   Neurological: Negative.   Endo/Heme/Allergies: Negative.   Psychiatric/Behavioral: Negative.     Blood pressure 128/90, pulse 87, temperature 98 F (36.7 C), temperature source Oral, resp. rate 18, SpO2 98 %. Physical Exam Constitutional:      Appearance: He is well-developed.  HENT:     Head: Normocephalic.     Mouth/Throat:     Dentition: Abnormal dentition (poor).  Eyes:     Pupils: Pupils are equal, round, and reactive to light.  Neck:     Thyroid: No thyromegaly.     Vascular: No JVD.     Trachea: No tracheal deviation.  Cardiovascular:     Rate and Rhythm: Normal rate and regular rhythm.     Pulses: Intact distal pulses.  Pulmonary:     Effort: Pulmonary effort is normal. No respiratory  distress.     Breath sounds: Normal breath sounds. No wheezing.  Abdominal:     Palpations: Abdomen is soft.     Tenderness: There is no abdominal tenderness. There is no guarding.  Musculoskeletal:     Cervical back: Neck supple.     Left hip: Tenderness and bony tenderness present. Decreased range of motion. Decreased strength.  Lymphadenopathy:     Cervical: No cervical adenopathy.  Skin:    General: Skin is warm and dry.  Neurological:     Mental Status: He is alert and oriented to person, place, and time.  Psychiatric:        Mood and Affect: Mood and affect normal.     Assessment/Plan: Left femoral neck fracture   Plan:   Discussed the need for surgery  Risks, benefits and expectations were discussed with the patient.  Risks including but not limited to the risk of anesthesia, blood clots, nerve damage, blood vessel damage, failure of the prosthesis, infection and up to and including death.  Patient understand the risks, benefits and expectations and wishes to proceed with surgery.   Dr. Charlann Boxerlin talked to Dr. Linna CapriceSwinteck who plans on bringing the patient to the OR tomorrow to perform a THA  Orders were placed  NPO after midnight     Genelle GatherMatthew Scott Center For Digestive Health And Pain ManagementBabish 06/14/2020, 9:12 AM

## 2020-06-14 NOTE — H&P (Signed)
History and Physical    Bobbi Kozakiewicz ZOX:[id number] DOB: [birth year] DOA: 06/14/2020  PCP: [facility]   Patient coming from:   Home  Chief Complaint:  Left hip pain after fall at home  HPI: Daronte Shostak is a 69 y.o. male with medical history significant for DMT2, HTN, CHF, OSA, CAD, BPH, HLD, [name] 3 presents by EMS for left hip pain after a fall at home.  He reports he was walking to the house when he tripped over a ether cord and fell landing on his left hip.  He was not able to stand up and sat on the floor for approximately 3 hours before EMS was called.  His wife is home with him but he thought he just sat and rested he be able to get up but after a few hours with the pain continuing he was not able to stand up his wife went ahead and called EMS.  He denies any chest pain or pressure.  He does not have any head trauma or loss of consciousness.  He does report having a dry cough for the last day.  He states he has not had any fever or chills.  He does not take any medications for his hip pain after the fall at home.  ED Course: He is found to have a left femoral neck fracture on x-ray.  He also has an elevated WBC of 16,000.  Chest x-ray shows early infiltrates.  Patient having coughing and elevated white blood cell count he is started on antibiotic therapy.  He was given Rocephin and azithromycin in the emergency room.  Azithromycin was changed to doxycycline when I saw the patient and admitted him due to prolonged QTC on EKG  Review of Systems:  General: Denies weakness, fever, chills, weight loss, night sweats.  Denies dizziness.  Denies change in appetite HENT: Denies head trauma, headache, denies change in hearing, tinnitus.  Denies nasal congestion or bleeding.  Denies sore throat, sores in mouth.  Denies difficulty swallowing Eyes: Denies blurry vision, pain in eye, drainage.  Denies discoloration of eyes. Neck: Denies pain.  Denies swelling.  Denies pain with  movement. Cardiovascular: Denies chest pain, palpitations.  Denies edema.  Denies orthopnea Respiratory: Denies shortness of breath, cough.  Denies wheezing.  Denies sputum production Gastrointestinal: Denies abdominal pain, swelling.  Denies nausea, vomiting, diarrhea.  Denies melena.  Denies hematemesis. Musculoskeletal: Reports pain in left hip and limitation of movement of left lower extremity. Denies arthralgias or myalgias. Genitourinary: Denies pelvic pain.  Denies urinary frequency or hesitancy.  Denies dysuria.  Skin: Denies rash.  Denies petechiae, purpura, ecchymosis. Neurological: Denies headache.  Denies syncope.  Denies seizure activity.  Denies weakness or paresthesia.  Denies slurred speech, drooping face.  Denies visual change. Psychiatric: Denies depression, anxiety.  Denies hallucinations.  Past Medical History:  Diagnosis Date  . Blockage of coronary artery of heart ([place]) 10/17/2011   wife states blocked 30%  . CHF (congestive heart failure) ([place])   . Chronic kidney disease   . Diabetes mellitus 10/17/2011   newly dx today  . Edema leg    right leg has leaky valve and right foot swells  . Emphysema   . Excessive ear wax   . Fatty liver   . Hernia    near navel  . Hyperlipidemia   . Hypertension   . Leaky heart valve   . Neuropathy   . Rheumatic fever   . Sleep apnea   .  Slow urinary stream   . TIA (transient ischemic attack)     Past Surgical History:  Procedure Laterality Date  . CIRCUMCISION    . LITHOTRIPSY    . PRESSURE SENSOR/CARDIOMEMS N/A 05/27/2019   Procedure: PRESSURE SENSOR/CARDIOMEMS;  Surgeon: Larey Dresser, MD;  Location: [facility];  Service: Cardiovascular;  Laterality: N/A;  . RIGHT HEART CATH N/A 05/27/2019   Procedure: RIGHT HEART CATH;  Surgeon: Larey Dresser, MD;  Location: [facility];  Service: Cardiovascular;  Laterality: N/A;    Social History  reports that he has never smoked. He has never used smokeless  tobacco. He reports that he does not drink alcohol and does not use drugs.  Allergies  Allergen Reactions  . Cashew Nut Oil Palpitations  . Percocet [Oxycodone-Acetaminophen] Other (See Comments)    Hallucintaions    Family History  Problem Relation Age of Onset  . Emphysema Mother   . Aneurysm Mother   . Emphysema Father   . Coronary artery disease Brother   . Coronary artery disease Brother   . Coronary artery disease Sister      Prior to Admission medications   Medication Sig Start Date End Date Taking? Authorizing Provider  Accu-Chek FastClix Lancets MISC USE UTD 01/23/19  Yes [provider]  ACCU-CHEK GUIDE test strip U TO TEST BLOOD SUGAR QD 01/22/19  Yes [provider]  aspirin EC 81 MG tablet Take 81 mg by mouth daily.   Yes [provider]  atorvastatin (LIPITOR) 40 MG tablet Take 1 tablet (40 mg total) by mouth every evening. Patient taking differently: Take 40 mg by mouth every evening. 08/05/19  Yes Larey Dresser, MD  Blood Glucose Monitoring Suppl (ACCU-CHEK GUIDE) w/Device KIT U TO TEST BLOOD SUGAR QD 01/22/19  Yes [provider]  carvedilol (COREG) 12.5 MG tablet TAKE ONE TABLET BY MOUTH TWICE A DAY Patient taking differently: Take 12.5 mg by mouth 2 (two) times daily with a meal. 02/29/20  Yes Larey Dresser, MD  Dextromethorphan HBr (ROBITUSSIN LINGERING COUGHGELS) 15 MG CAPS Take 15 mg by mouth daily as needed (cough).    Yes [provider]  famotidine (PEPCID) 20 MG tablet Take 20 mg by mouth at bedtime.   Yes [provider]  fenofibrate (TRICOR) 48 MG tablet Take 48 mg by mouth daily. 04/09/20  Yes [provider]  finasteride (PROSCAR) 5 MG tablet Take 5 mg by mouth daily.   Yes [provider]  gabapentin (NEURONTIN) 400 MG capsule Take 400 mg by mouth 2 (two) times daily. 04/13/20  Yes [provider]  hydrOXYzine (VISTARIL) 50 MG capsule Take 50 mg by mouth at bedtime.    Yes  [provider]  metFORMIN (GLUCOPHAGE) 1000 MG tablet Take 0.5 tablets (500 mg total) by mouth 2 (two) times daily with a meal. 04/14/20  Yes Larey Dresser, MD  montelukast (SINGULAIR) 10 MG tablet Take 10 mg by mouth daily.    Yes [provider]  Multiple Vitamin (MULTIVITAMIN) tablet Take 1 tablet by mouth daily.   Yes [provider]  pantoprazole (PROTONIX) 40 MG tablet Take 1 tablet (40 mg total) by mouth daily. 10/11/13  Yes Isaac Bliss, Rayford Halsted, MD  potassium chloride SA (KLOR-CON) 20 MEQ tablet Take 20 mEq by mouth daily.    Yes [provider]  SYMBICORT 80-4.5 MCG/ACT inhaler Inhale 2 puffs into the lungs daily as needed (wheezing). 08/28/19  Yes [provider]  tamsulosin (FLOMAX) 0.4 MG CAPS capsule Take 0.4 mg by mouth daily. 05/03/15  Yes [provider]  torsemide (DEMADEX) 20 MG tablet Take 2 tablets (40 mg total) by mouth in the morning AND 1 tablet (20 mg total) every evening. 04/14/20  Yes Larey Dresser, MD  VENTOLIN HFA [phone] Base) MCG/ACT inhaler Inhale 2 puffs into the lungs every 6 (six) hours as needed for wheezing or shortness of breath.  08/19/19  Yes [provider]    Physical Exam: Vitals:   06/14/20 0115 06/14/20 0332 06/14/20 0400  BP: (!) 143/95 130/78 (!) 163/87  Pulse: (!) 103 90 94  Resp: 18 (!) 28 (!) 25  Temp: 98.5 F (36.9 C)    TempSrc: Oral    SpO2: 92% 96% 100%    Constitutional: NAD, calm, comfortable Vitals:   06/14/20 0115 06/14/20 0332 06/14/20 0400  BP: (!) 143/95 130/78 (!) 163/87  Pulse: (!) 103 90 94  Resp: 18 (!) 28 (!) 25  Temp: 98.5 F (36.9 C)    TempSrc: Oral    SpO2: 92% 96% 100%   General: WDWN, Alert and oriented x3.  Eyes: EOMI, PERRL, conjunctivae normal.  Sclera nonicteric HENT:  [place]/AT, external ears normal.  Nares patent without epistasis.  Mucous membranes are moist. Posterior pharynx clear of any exudate or lesions.   Neck: Soft, normal range  of motion, supple, no masses, no thyromegaly.  Trachea midline Respiratory: clear to auscultation bilaterally, no wheezing, no crackles. Normal respiratory effort. No accessory muscle use.  Cardiovascular: Regular rate and rhythm, no murmurs / rubs / gallops. No extremity edema. 2+ pedal pulses Abdomen: Soft, no tenderness, nondistended, no rebound or guarding. Obese. No masses palpated. Bowel sounds normoactive Musculoskeletal: Left hip tender to palpation anteriorly and laterally. Does not move left leg due to pain. All other extremities with ROM. No cyanosis. No joint deformity upper and lower extremities. Normal muscle tone.  Skin: Warm, dry, intact no rashes, lesions, ulcers. No induration Neurologic: CN 2-12 grossly intact.  Normal speech.  Sensation intact, Strength 5/5 in all extremities.   Psychiatric: Normal judgment and insight.  Normal mood.    Labs on Admission: I have personally reviewed following labs and imaging studies  CBC: Recent Labs  Lab 06/14/20 0133  WBC 16.5*  NEUTROABS 15.0*  HGB 11.4*  HCT 36.1*  MCV 89.4  PLT 295    Basic Metabolic Panel: Recent Labs  Lab 06/14/20 0133  NA 137  K 3.7  CL 98  CO2 24  GLUCOSE 257*  BUN 44*  CREATININE 1.94*  CALCIUM 8.7*    GFR: CrCl cannot be calculated (Unknown ideal weight.).  Liver Function Tests: No results for input(s): AST, ALT, ALKPHOS, BILITOT, PROT, ALBUMIN in the last 168 hours.  Urine analysis:    Component Value Date/Time   COLORURINE YELLOW 06/01/2018 2157   APPEARANCEUR CLEAR 06/01/2018 2157   LABSPEC 1.011 06/01/2018 2157   PHURINE 5.0 06/01/2018 2157   GLUCOSEU 50 (A) 06/01/2018 2157   HGBUR NEGATIVE 06/01/2018 2157   [place] NEGATIVE 06/01/2018 2157   [place] NEGATIVE 06/01/2018 2157   PROTEINUR 100 (A) 06/01/2018 2157   UROBILINOGEN 1.0 05/04/2015 1415   NITRITE NEGATIVE 06/01/2018 2157   LEUKOCYTESUR NEGATIVE 06/01/2018 2157    Radiological Exams on Admission: DG Chest 1  View  Result Date: 06/14/2020 CLINICAL DATA:  Left hip pain after fall EXAM: CHEST  1 VIEW COMPARISON:  02/25/2019 FINDINGS: Cardiomegaly. Patchy bilateral airspace disease. Mild vascular congestion. No  effusions or pneumothorax. No acute bony abnormality. IMPRESSION: Cardiomegaly, vascular congestion. Patchy bilateral airspace disease concerning for pneumonia. Electronically Signed   By: Rolm Baptise M.D.   On: 06/14/2020 02:18   DG Hip Unilat W or Wo Pelvis 2-3 Views Left  Result Date: 06/14/2020 CLINICAL DATA:  Fall, left hip pain EXAM: DG HIP (WITH OR WITHOUT PELVIS) 2-3V LEFT COMPARISON:  None. FINDINGS: There is a left femoral neck fracture with angulation. No subluxation or dislocation. SI joints symmetric and unremarkable. IMPRESSION: Angulated left femoral neck fracture. Electronically Signed   By: Rolm Baptise M.D.   On: 06/14/2020 02:13    EKG: Independently reviewed.  Normal sinus rhythm with no acute ST elevation or depression.  Left bundle branch block present.  QTc prolonged at 533  Assessment/Plan Principal Problem:   Fracture of femoral neck, left, closed  Mr. Soderholm is admitted to med/surg floor.  Orthopedic service has been consulted and will see patient in the morning.  Patient would not be able to be taken to the OR before Wednesday 12/16 according to orthopedist on call so patient can have a diet. Pain control provided. PT to be consulted. Consult discharge planning team to start working on rehab services that will be needed after surgery  Active Problems:   CAP (community acquired pneumonia)  Patient is placed on Rocephin and doxycycline for community-acquired pneumonia on chest x-ray.  Patient has mild elevated white blood cell count of 16,000.  Does report having productive cough that started within the last 24 hours but he has not had any fever.    HTN (hypertension) Continue home dose of Coreg.  Monitor blood pressure.  Blood pressure remains elevated make  adjustments to regimen    Chronic systolic and diastolic heart failure [facility]) Patient with chronic CHF.  Patient may benefit from addition of Entresto and a SLGT-2 inhibitor to his regimen    Diabetes mellitus type 2, controlled, without complications  Continue Metformin twice a day.  Monitor blood sugars before every meal nightly.  Sliding scale insulin provide as needed for glycemic control. Check hemoglobin A1c If A1c level is elevated patient would benefit from addition of an SLGT-2 inhibitor to his therapy which would benefit both his diabetes and his CHF    [name] (chronic kidney disease), stage III (HCC) Stable Monitor electrolytes and renal function with labs in morning    OSA (obstructive sleep apnea) Continue home CPAP at night when sleeping    Prolonged QT interval Avoid medications which could further prolong    DVT prophylaxis: Heparin for DVT prophylaxis, will need to be held 12 hours before going to OR Code Status:   Full code  Family Communication:  Diagnosis and plan discussed with patient.  Patient verbalized understanding and agrees with plan.  Further recommendations to follow as clinically indicated Disposition Plan:   Patient is from:  Home  Anticipated DC to:  Rehab facility  Anticipated DC date:  Anticipate greater than 2 midnight stay in the hospital  Anticipated DC barriers: We will have discharge planning team start working on acquiring rehab      bed for patient so there will be no barriers to discharge  Consults called:  Orthopedic surgery-stated they would not be able to take to OR until Wednesday 06/16/2020 so pt can have diet now.  Admission status:  Inpatient   Yevonne Aline Oaklyn Mans MD [facility]  How to contact the [facility] Attending or Consulting provider [place] or covering provider during after  hours 7P -7A, for this patient?   1. Check the care team in [place] and look for a) attending/consulting [facility] provider listed and b) the [facility] team  listed 2. Log into [email or web address] and use [place]'s universal password to access. If you do not have the password, please contact the hospital operator. 3. Locate the [facility] provider you are looking for under [facility] and page to a number that you can be directly reached. 4. If you still have difficulty reaching the provider, please page the  [facility] (Director on Call) for the Hospitalists listed on amion for assistance.  06/14/2020, 4:40 AM

## 2020-06-14 NOTE — Plan of Care (Signed)
Consents obtained. Plan of care discussed.

## 2020-06-15 ENCOUNTER — Encounter (HOSPITAL_COMMUNITY)
Admission: EM | Disposition: A | Discharge status: Home Health | Payer: Self-pay | Source: Home / Self Care | Attending: Family Medicine

## 2020-06-15 ENCOUNTER — Ambulatory Visit: Payer: Medicare Other | Admitting: Podiatry

## 2020-06-15 LAB — GLUCOSE, CAPILLARY
Glucose-Capillary: 219 mg/dL — ABNORMAL HIGH (ref 70–99)
Glucose-Capillary: 129 mg/dL — ABNORMAL HIGH (ref 70–99)
Glucose-Capillary: 179 mg/dL — ABNORMAL HIGH (ref 70–99)
Glucose-Capillary: 233 mg/dL — ABNORMAL HIGH (ref 70–99)

## 2020-06-15 LAB — SURGICAL PCR SCREEN
MRSA, PCR: NEGATIVE
Staphylococcus aureus: NEGATIVE

## 2020-06-15 LAB — TYPE AND SCREEN
ABO/RH(D): A POS
Antibody Screen: NEGATIVE

## 2020-06-15 SURGERY — ARTHROPLASTY, HIP, TOTAL, ANTERIOR APPROACH
Anesthesia: General | Site: Hip | Laterality: Left

## 2020-06-15 MED ORDER — ENOXAPARIN SODIUM 30 MG/0.3ML ~~LOC~~ SOLN
30.0000 mg | Freq: Once | SUBCUTANEOUS | Status: AC
  Administered 2020-06-15: 30 mg via SUBCUTANEOUS
  Filled 2020-06-15: qty 0.3

## 2020-06-15 MED ORDER — CHLORHEXIDINE GLUCONATE CLOTH 2 % EX PADS
6.0000 | MEDICATED_PAD | Freq: Every day | CUTANEOUS | Status: DC
  Administered 2020-06-15 – 2020-06-16 (×2): 6 via TOPICAL

## 2020-06-15 MED ORDER — GUAIFENESIN 100 MG/5ML PO SOLN
5.0000 mL | ORAL | Status: DC | PRN
  Administered 2020-06-15: 100 mg via ORAL
  Filled 2020-06-15: qty 10

## 2020-06-15 NOTE — Progress Notes (Signed)
PT Cancellation Note  Patient Details Name: [name] MRN: [id number] DOB: [birth year]   Cancelled Treatment:    Reason Eval/Treat Not Completed: Other (comment)scheduled for surgery today.   Rada Hay 06/15/2020, 6:46 AM Blanchard Kelch PT Acute Rehabilitation Services Pager [phone] Office [phone]

## 2020-06-15 NOTE — Plan of Care (Signed)
  Problem: Clinical Measurements: Goal: Respiratory complications will improve Outcome: Progressing   Problem: Nutrition: Goal: Adequate nutrition will be maintained Outcome: Progressing   

## 2020-06-15 NOTE — Progress Notes (Signed)
PROGRESS NOTE    [name]  RDE:[id number] DOB: December 06, 1950 DOA: 06/14/2020 PCP: [facility]    Brief Narrative:  This 68 years old male with history of DM 2, hypertension, CHF, CAD, [name] stage IIIa admitted status post mechanical fall with left hip fracture. Patient was also recently diagnosed with community-acquired pneumonia and was prescribed doxycycline.  Orthopedics consulted ,  scheduled to have open reduction internal fixation 12/16.  Assessment & Plan:   Principal Problem:   Fracture of femoral neck, left, closed (HCC) Active Problems:   HTN (hypertension)   Chronic systolic heart failure (HCC)   Diabetes mellitus type 2, controlled, without complications (HCC)   OSA (obstructive sleep apnea)   CAP (community acquired pneumonia)   [name] (chronic kidney disease), stage III (HCC)   Prolonged QT interval  Fracture of femoral neck left closed: Patient is s/p mechanical fall with left femoral neck fracture. Orthopedic consulted , scheduled to have ORIF 12/16 Adequate pain control, NPO midnight PT OT evaluation after surgery.  Community-acquired pneumonia: Infiltrate seen on chest x-ray with elevated WBC.  Elevated WBC could also be reactive.   Currently on doxycycline.  Incentive spirometer, flutter valve when out of bed to chair. Bronchodilators as needed. Procalcitonin 0.13.  Prolonged QTC- Avoid QT prolongation medications.  Repeat EKG in a.m.  Hypertension:  Continue Home blood pressure medications.  Diabetes mellitus type 2- insulin sliding scale and Accu-Chek, Metformin on hold.  Congestive heart failure with reduced EF, EF 45%, grade 1 DD-appears euvolemic.   Closely monitor.  Continue home medicines  Peripheral neuropathy-continue gabapentin  [name] stage IIIa:  renal functions are stable.  Obstructive sleep apnea: Continue CPAP at night.   DVT prophylaxis: Lovenox Code Status: Full code Family Communication: No family at  bedside Disposition Plan: Status is: Inpatient  Remains inpatient appropriate because:Inpatient level of care appropriate due to severity of illness   Dispo: The patient is from: Home              Anticipated d/c is to: SNF              Anticipated d/c date is: 2 days              Patient currently is not medically stable to d/c.   Consultants:   Orthopedics  Procedures:  Antimicrobials:  Anti-infectives (From admission, onward)   Start     Dose/Rate Route Frequency Ordered Stop   06/15/20 1400  ceFAZolin (ANCEF) IVPB 2g/100 mL premix        2 g 200 mL/hr over 30 Minutes Intravenous On call to O.R. 06/14/20 2254 06/16/20 0559   06/15/20 0600  cefTRIAXone (ROCEPHIN) 2 g in sodium chloride 0.9 % 100 mL IVPB        2 g 200 mL/hr over 30 Minutes Intravenous Every 24 hours 06/14/20 0620     06/14/20 1000  doxycycline (VIBRA-TABS) tablet 100 mg        100 mg Oral Every 12 hours 06/14/20 0614     06/14/20 0615  cefTRIAXone (ROCEPHIN) 1 g in sodium chloride 0.9 % 100 mL IVPB  Status:  Discontinued        1 g 200 mL/hr over 30 Minutes Intravenous Every 24 hours 06/14/20 0614 06/14/20 0619   06/14/20 0400  cefTRIAXone (ROCEPHIN) 1 g in sodium chloride 0.9 % 100 mL IVPB        1 g 200 mL/hr over 30 Minutes Intravenous  Once 06/14/20 0349 06/14/20 6314  06/14/20 0400  azithromycin (ZITHROMAX) 500 mg in sodium chloride 0.9 % 250 mL IVPB        500 mg 250 mL/hr over 60 Minutes Intravenous  Once 06/14/20 0349 06/14/20 0732     Subjective: Seen and examined at bedside.  Overnight events noted,  Patient reports having pain while moving otherwise pain is controlled.   He reports having cough with phlegm.  Objective: Vitals:   06/15/20 0227 06/15/20 0601 06/15/20 0924 06/15/20 1400  BP: 128/77 (!) 162/90 (!) 148/81 140/83  Pulse: 86 88 88 89  Resp: 16 20 20 16   Temp: 98.1 F (36.7 C) 98.8 F (37.1 C) 97.8 F (36.6 C) 98.3 F (36.8 C)  TempSrc: Oral Oral Oral Oral  SpO2: 96% 95%  97% 97%  Weight:      Height:        Intake/Output Summary (Last 24 hours) at 06/15/2020 1639 Last data filed at 06/15/2020 1500 Gross per 24 hour  Intake 720 ml  Output 1900 ml  Net -1180 ml   Filed Weights   06/14/20 2247  Weight: 91.7 kg    Examination:  General exam: Appears calm and comfortable , Not in any distress. Respiratory system: Clear to auscultation. Respiratory effort normal. Cardiovascular system: S1 & S2 heard, RRR. No JVD, murmurs, rubs, gallops or clicks. No pedal edema. Gastrointestinal system: Abdomen is nondistended, soft and nontender. No organomegaly or masses felt. Normal bowel sounds heard. Central nervous system: Alert and oriented. No focal neurological deficits. Extremities: Left hip tenderness noted.  Skin: No rashes, lesions or ulcers Psychiatry: Judgement and insight appear normal. Mood & affect appropriate.     Data Reviewed: I have personally reviewed following labs and imaging studies  CBC: Recent Labs  Lab 06/14/20 0133 06/14/20 0727  WBC 16.5* 12.6*  NEUTROABS 15.0*  --   HGB 11.4* 10.8*  HCT 36.1* 33.9*  MCV 89.4 90.2  PLT 358 329   Basic Metabolic Panel: Recent Labs  Lab 06/14/20 0133 06/14/20 0727  NA 137  --   K 3.7  --   CL 98  --   CO2 24  --   GLUCOSE 257*  --   BUN 44*  --   CREATININE 1.94* 1.63*  CALCIUM 8.7*  --    GFR: Estimated Creatinine Clearance: 45.5 mL/min (A) (by C-G formula based on SCr of 1.63 mg/dL (H)). Liver Function Tests: No results for input(s): AST, ALT, ALKPHOS, BILITOT, PROT, ALBUMIN in the last 168 hours. No results for input(s): LIPASE, AMYLASE in the last 168 hours. No results for input(s): AMMONIA in the last 168 hours. Coagulation Profile: Recent Labs  Lab 06/14/20 0133  INR 1.0   Cardiac Enzymes: No results for input(s): CKTOTAL, CKMB, CKMBINDEX, TROPONINI in the last 168 hours. BNP (last 3 results) No results for input(s): PROBNP in the last 8760 hours. HbA1C: Recent  Labs    06/14/20 0727  HGBA1C 7.8*   CBG: Recent Labs  Lab 06/14/20 2137 06/14/20 2253 06/15/20 0744 06/15/20 1200 06/15/20 1636  GLUCAP 164* 159* 219* 129* 179*   Lipid Profile: No results for input(s): CHOL, HDL, LDLCALC, TRIG, CHOLHDL, LDLDIRECT in the last 72 hours. Thyroid Function Tests: Recent Labs    06/14/20 1145  TSH 1.833   Anemia Panel: No results for input(s): VITAMINB12, FOLATE, FERRITIN, TIBC, IRON, RETICCTPCT in the last 72 hours. Sepsis Labs: Recent Labs  Lab 06/14/20 0133  PROCALCITON 0.13    Recent Results (from the past 240 hour(s))  Resp  Panel by RT-PCR (Flu A&B, Covid) Nasopharyngeal Swab     Status: None   Collection Time: 06/14/20  2:28 AM   Specimen: Nasopharyngeal Swab; Nasopharyngeal(NP) swabs in vial transport medium  Result Value Ref Range Status   SARS Coronavirus 2 by RT PCR NEGATIVE NEGATIVE Final    Comment: (NOTE) SARS-CoV-2 target nucleic acids are NOT DETECTED.  The SARS-CoV-2 RNA is generally detectable in upper respiratory specimens during the acute phase of infection. The lowest concentration of SARS-CoV-2 viral copies this assay can detect is 138 copies/mL. A negative result does not preclude SARS-Cov-2 infection and should not be used as the sole basis for treatment or other patient management decisions. A negative result may occur with  improper specimen collection/handling, submission of specimen other than nasopharyngeal swab, presence of viral mutation(s) within the areas targeted by this assay, and inadequate number of viral copies(<138 copies/mL). A negative result must be combined with clinical observations, patient history, and epidemiological information. The expected result is Negative.  Fact Sheet for Patients:  BloggerCourse.com  Fact Sheet for Healthcare Providers:  SeriousBroker.it  This test is no t yet approved or cleared by the [place] FDA and   has been authorized for detection and/or diagnosis of SARS-CoV-2 by FDA under an Emergency Use Authorization (EUA). This EUA will remain  in effect (meaning this test can be used) for the duration of the COVID-19 declaration under Section 564(b)(1) of the Act, 21 U.S.C.section 360bbb-3(b)(1), unless the authorization is terminated  or revoked sooner.       Influenza A by PCR NEGATIVE NEGATIVE Final   Influenza B by PCR NEGATIVE NEGATIVE Final    Comment: (NOTE) The Xpert Xpress SARS-CoV-2/FLU/RSV plus assay is intended as an aid in the diagnosis of influenza from Nasopharyngeal swab specimens and should not be used as a sole basis for treatment. Nasal washings and aspirates are unacceptable for Xpert Xpress SARS-CoV-2/FLU/RSV testing.  Fact Sheet for Patients: BloggerCourse.com  Fact Sheet for Healthcare Providers: SeriousBroker.it  This test is not yet approved or cleared by the [place] FDA and has been authorized for detection and/or diagnosis of SARS-CoV-2 by FDA under an Emergency Use Authorization (EUA). This EUA will remain in effect (meaning this test can be used) for the duration of the COVID-19 declaration under Section 564(b)(1) of the Act, 21 U.S.C. section 360bbb-3(b)(1), unless the authorization is terminated or revoked.  Performed at [facility], [address], [place], [place] [address]   Surgical PCR screen     Status: None   Collection Time: 06/14/20 11:30 PM   Specimen: Nasal Mucosa; Nasal Swab  Result Value Ref Range Status   MRSA, PCR NEGATIVE NEGATIVE Final   Staphylococcus aureus NEGATIVE NEGATIVE Final    Comment: (NOTE) The Xpert SA Assay (FDA approved for NASAL specimens in patients 23 years of age and older), is one component of a comprehensive surveillance program. It is not intended to diagnose infection nor to guide or monitor treatment. Performed at [facility], [address] [place], [place] [address]     Radiology Studies: DG Chest 1 View  Result Date: 06/14/2020 CLINICAL DATA:  Left hip pain after fall EXAM: CHEST  1 VIEW COMPARISON:  02/25/2019 FINDINGS: Cardiomegaly. Patchy bilateral airspace disease. Mild vascular congestion. No effusions or pneumothorax. No acute bony abnormality. IMPRESSION: Cardiomegaly, vascular congestion. Patchy bilateral airspace disease concerning for pneumonia. Electronically Signed   By: Charlett Nose M.D.   On: 06/14/2020 02:18   DG  Hip Unilat W or Wo Pelvis 2-3 Views Left  Result Date: 06/14/2020 CLINICAL DATA:  Fall, left hip pain EXAM: DG HIP (WITH OR WITHOUT PELVIS) 2-3V LEFT COMPARISON:  None. FINDINGS: There is a left femoral neck fracture with angulation. No subluxation or dislocation. SI joints symmetric and unremarkable. IMPRESSION: Angulated left femoral neck fracture. Electronically Signed   By: Charlett Nose M.D.   On: 06/14/2020 02:13    Scheduled Meds: . atorvastatin  40 mg Oral QPM  . carvedilol  12.5 mg Oral BID WC  . chlorhexidine  60 mL Topical Once  . Chlorhexidine Gluconate Cloth  6 each Topical Daily  . doxycycline  100 mg Oral Q12H  . famotidine  20 mg Oral QHS  . fenofibrate  54 mg Oral Daily  . finasteride  5 mg Oral Daily  . fluticasone furoate-vilanterol  1 puff Inhalation Daily  . gabapentin  400 mg Oral BID  . insulin aspart  0-15 Units Subcutaneous TID WC  . insulin aspart  0-5 Units Subcutaneous QHS  . povidone-iodine  2 application Topical Once  . tamsulosin  0.4 mg Oral Daily  . torsemide  20 mg Oral BID   Continuous Infusions: .  ceFAZolin (ANCEF) IV    . cefTRIAXone (ROCEPHIN)  IV 2 g (06/15/20 1020)  . tranexamic acid       LOS: 1 day    Time spent: 25 mins.    Cipriano Bunker, MD [facility]   If 7PM-7AM, please contact night-coverage

## 2020-06-15 NOTE — Progress Notes (Signed)
OT Cancellation Note  Patient Details Name: [name] MRN: [id number] DOB: 09/15/1950   Cancelled Treatment:    Reason Eval/Treat Not Completed: Patient not medically ready. Received order for evaluation. Patient awaiting surgery. Will f/u after surgery.  [name] [name] 06/15/2020, 6:46 AM

## 2020-06-15 NOTE — Progress Notes (Signed)
Due to lack of OR availability today, surgery rescheduled for tomorrow. NPO after MN tonight. One time dose of lovenox ordered, then hold blood thinners.

## 2020-06-16 ENCOUNTER — Inpatient Hospital Stay (HOSPITAL_COMMUNITY): Payer: Medicare Other

## 2020-06-16 ENCOUNTER — Encounter (HOSPITAL_COMMUNITY)
Admission: EM | Disposition: A | Discharge status: Home Health | Payer: Self-pay | Source: Home / Self Care | Attending: Family Medicine

## 2020-06-16 ENCOUNTER — Inpatient Hospital Stay (HOSPITAL_COMMUNITY): Payer: Medicare Other | Admitting: Anesthesiology

## 2020-06-16 ENCOUNTER — Encounter (INDEPENDENT_AMBULATORY_CARE_PROVIDER_SITE_OTHER): Payer: Medicare Other | Admitting: Ophthalmology

## 2020-06-16 ENCOUNTER — Encounter (HOSPITAL_COMMUNITY): Payer: Self-pay | Admitting: Family Medicine

## 2020-06-16 HISTORY — PX: TOTAL HIP ARTHROPLASTY: SHX124

## 2020-06-16 LAB — MAGNESIUM: Magnesium: 1.7 mg/dL (ref 1.7–2.4)

## 2020-06-16 LAB — BASIC METABOLIC PANEL
Anion gap: 10 (ref 5–15)
BUN: 31 mg/dL — ABNORMAL HIGH (ref 8–23)
CO2: 29 mmol/L (ref 22–32)
Calcium: 8.2 mg/dL — ABNORMAL LOW (ref 8.9–10.3)
Chloride: 99 mmol/L (ref 98–111)
Creatinine, Ser: 1.46 mg/dL — ABNORMAL HIGH (ref 0.61–1.24)
GFR, Estimated: 51 mL/min — ABNORMAL LOW (ref >60)
Glucose, Bld: 143 mg/dL — ABNORMAL HIGH (ref 70–99)
Potassium: 3.6 mmol/L (ref 3.5–5.1)
Sodium: 138 mmol/L (ref 135–145)

## 2020-06-16 LAB — CBC
HCT: 31.7 % — ABNORMAL LOW (ref 39.0–52.0)
Hemoglobin: 10.3 g/dL — ABNORMAL LOW (ref 13.0–17.0)
MCH: 29.3 pg (ref 26.0–34.0)
MCHC: 32.5 g/dL (ref 30.0–36.0)
MCV: 90.3 fL (ref 80.0–100.0)
Platelets: 261 10*3/uL (ref 150–400)
RBC: 3.51 MIL/uL — ABNORMAL LOW (ref 4.22–5.81)
RDW: 14 % (ref 11.5–15.5)
WBC: 11.3 10*3/uL — ABNORMAL HIGH (ref 4.0–10.5)
nRBC: 0 % (ref 0.0–0.2)

## 2020-06-16 LAB — GLUCOSE, CAPILLARY
Glucose-Capillary: 140 mg/dL — ABNORMAL HIGH (ref 70–99)
Glucose-Capillary: 152 mg/dL — ABNORMAL HIGH (ref 70–99)
Glucose-Capillary: 150 mg/dL — ABNORMAL HIGH (ref 70–99)
Glucose-Capillary: 211 mg/dL — ABNORMAL HIGH (ref 70–99)

## 2020-06-16 SURGERY — ARTHROPLASTY, HIP, TOTAL, ANTERIOR APPROACH
Anesthesia: General | Site: Hip | Laterality: Left

## 2020-06-16 MED ORDER — LACTATED RINGERS IV SOLN: INTRAVENOUS | Status: DC | PRN

## 2020-06-16 MED ORDER — PROPOFOL 10 MG/ML IV BOLUS
INTRAVENOUS | Status: DC | PRN
  Administered 2020-06-16: 100 mg via INTRAVENOUS
  Administered 2020-06-16: 50 mg via INTRAVENOUS

## 2020-06-16 MED ORDER — DM-GUAIFENESIN ER 30-600 MG PO TB12: 1.0000 | ORAL_TABLET | Freq: Two times a day (BID) | ORAL | Status: DC | PRN

## 2020-06-16 MED ORDER — PROPOFOL 1000 MG/100ML IV EMUL
INTRAVENOUS | Status: AC
  Filled 2020-06-16: qty 100

## 2020-06-16 MED ORDER — SORBITOL 70 % SOLN
30.0000 mL | Freq: Every day | Status: DC | PRN
  Filled 2020-06-16: qty 30

## 2020-06-16 MED ORDER — ENOXAPARIN SODIUM 40 MG/0.4ML ~~LOC~~ SOLN
40.0000 mg | SUBCUTANEOUS | Status: DC
  Administered 2020-06-17 – 2020-06-19 (×3): 40 mg via SUBCUTANEOUS
  Filled 2020-06-16 (×3): qty 0.4

## 2020-06-16 MED ORDER — BUPIVACAINE-EPINEPHRINE 0.5% -1:200000 IJ SOLN
INTRAMUSCULAR | Status: DC | PRN
  Administered 2020-06-16: 30 mL

## 2020-06-16 MED ORDER — FENTANYL CITRATE (PF) 100 MCG/2ML IJ SOLN: 25.0000 ug | INTRAMUSCULAR | Status: DC | PRN

## 2020-06-16 MED ORDER — MIDAZOLAM HCL 2 MG/2ML IJ SOLN
INTRAMUSCULAR | Status: AC
  Filled 2020-06-16: qty 2

## 2020-06-16 MED ORDER — PHENOL 1.4 % MT LIQD: 1.0000 | OROMUCOSAL | Status: DC | PRN

## 2020-06-16 MED ORDER — MAGNESIUM CITRATE PO SOLN: 1.0000 | Freq: Once | ORAL | Status: DC | PRN

## 2020-06-16 MED ORDER — DOCUSATE SODIUM 100 MG PO CAPS
100.0000 mg | ORAL_CAPSULE | Freq: Two times a day (BID) | ORAL | Status: DC
  Administered 2020-06-16 – 2020-06-19 (×6): 100 mg via ORAL
  Filled 2020-06-16 (×6): qty 1

## 2020-06-16 MED ORDER — ISOPROPYL ALCOHOL 70 % SOLN
Status: DC | PRN
  Administered 2020-06-16: 1 via TOPICAL

## 2020-06-16 MED ORDER — PROPOFOL 10 MG/ML IV BOLUS
INTRAVENOUS | Status: DC | PRN
  Administered 2020-06-16 (×2): 20 mg via INTRAVENOUS

## 2020-06-16 MED ORDER — MIDAZOLAM HCL 5 MG/5ML IJ SOLN
INTRAMUSCULAR | Status: DC | PRN
  Administered 2020-06-16 (×2): 1 mg via INTRAVENOUS

## 2020-06-16 MED ORDER — PROPOFOL 500 MG/50ML IV EMUL
INTRAVENOUS | Status: DC | PRN
  Administered 2020-06-16: 75 ug/kg/min via INTRAVENOUS

## 2020-06-16 MED ORDER — POVIDONE-IODINE 10 % EX SWAB
2.0000 "application " | Freq: Once | CUTANEOUS | Status: AC
  Administered 2020-06-16: 2 via TOPICAL

## 2020-06-16 MED ORDER — FENTANYL CITRATE (PF) 250 MCG/5ML IJ SOLN
INTRAMUSCULAR | Status: AC
  Filled 2020-06-16: qty 5

## 2020-06-16 MED ORDER — KETOROLAC TROMETHAMINE 30 MG/ML IJ SOLN
INTRAMUSCULAR | Status: DC | PRN
  Administered 2020-06-16: 30 mg via INTRAVENOUS

## 2020-06-16 MED ORDER — FENTANYL CITRATE (PF) 100 MCG/2ML IJ SOLN
INTRAMUSCULAR | Status: AC
  Filled 2020-06-16: qty 2

## 2020-06-16 MED ORDER — ONDANSETRON HCL 4 MG/2ML IJ SOLN
4.0000 mg | Freq: Four times a day (QID) | INTRAMUSCULAR | Status: DC | PRN
Start: 2020-06-16 — End: 2020-06-16

## 2020-06-16 MED ORDER — SODIUM CHLORIDE (PF) 0.9 % IJ SOLN
INTRAMUSCULAR | Status: AC
  Filled 2020-06-16: qty 50

## 2020-06-16 MED ORDER — DEXAMETHASONE SODIUM PHOSPHATE 10 MG/ML IJ SOLN
INTRAMUSCULAR | Status: AC
  Filled 2020-06-16: qty 1

## 2020-06-16 MED ORDER — MENTHOL 3 MG MT LOZG: 1.0000 | LOZENGE | OROMUCOSAL | Status: DC | PRN

## 2020-06-16 MED ORDER — KETAMINE HCL 10 MG/ML IJ SOLN
INTRAMUSCULAR | Status: DC | PRN
  Administered 2020-06-16: 30 mg via INTRAVENOUS

## 2020-06-16 MED ORDER — PROPOFOL 10 MG/ML IV BOLUS
INTRAVENOUS | Status: AC
  Filled 2020-06-16: qty 20

## 2020-06-16 MED ORDER — PHENYLEPHRINE HCL (PRESSORS) 10 MG/ML IV SOLN
INTRAVENOUS | Status: AC
  Filled 2020-06-16: qty 1

## 2020-06-16 MED ORDER — PHENYLEPHRINE HCL (PRESSORS) 10 MG/ML IV SOLN
INTRAVENOUS | Status: DC | PRN
  Administered 2020-06-16: 80 ug via INTRAVENOUS

## 2020-06-16 MED ORDER — SUCCINYLCHOLINE CHLORIDE 20 MG/ML IJ SOLN
INTRAMUSCULAR | Status: DC | PRN
  Administered 2020-06-16: 100 mg via INTRAVENOUS

## 2020-06-16 MED ORDER — KETAMINE HCL 10 MG/ML IJ SOLN
INTRAMUSCULAR | Status: AC
  Filled 2020-06-16: qty 1

## 2020-06-16 MED ORDER — SODIUM CHLORIDE (PF) 0.9 % IJ SOLN
INTRAMUSCULAR | Status: DC | PRN
  Administered 2020-06-16: 30 mL via INTRAVENOUS

## 2020-06-16 MED ORDER — ACETAMINOPHEN 500 MG PO TABS
1000.0000 mg | ORAL_TABLET | Freq: Once | ORAL | Status: AC
  Administered 2020-06-16: 1000 mg via ORAL
  Filled 2020-06-16: qty 2

## 2020-06-16 MED ORDER — SODIUM CHLORIDE 0.9 % IR SOLN
Status: DC | PRN
  Administered 2020-06-16: 3000 mL

## 2020-06-16 MED ORDER — FENTANYL CITRATE (PF) 100 MCG/2ML IJ SOLN
INTRAMUSCULAR | Status: DC | PRN
  Administered 2020-06-16 (×2): 50 ug via INTRAVENOUS
  Administered 2020-06-16: 100 ug via INTRAVENOUS
  Administered 2020-06-16 (×2): 50 ug via INTRAVENOUS

## 2020-06-16 MED ORDER — PHENYLEPHRINE HCL-NACL 10-0.9 MG/250ML-% IV SOLN
INTRAVENOUS | Status: DC | PRN
  Administered 2020-06-16: 50 ug/min via INTRAVENOUS

## 2020-06-16 MED ORDER — ONDANSETRON HCL 4 MG PO TABS: 4.0000 mg | ORAL_TABLET | Freq: Four times a day (QID) | ORAL | Status: DC | PRN

## 2020-06-16 MED ORDER — CEFAZOLIN SODIUM-DEXTROSE 2-4 GM/100ML-% IV SOLN
2.0000 g | INTRAVENOUS | Status: DC
  Filled 2020-06-16: qty 100

## 2020-06-16 MED ORDER — DEXAMETHASONE SODIUM PHOSPHATE 10 MG/ML IJ SOLN
INTRAMUSCULAR | Status: DC | PRN
  Administered 2020-06-16: 10 mg via INTRAVENOUS

## 2020-06-16 MED ORDER — WATER FOR IRRIGATION, STERILE IR SOLN
Status: DC | PRN
  Administered 2020-06-16: 2000 mL

## 2020-06-16 MED ORDER — BUPIVACAINE IN DEXTROSE 0.75-8.25 % IT SOLN
INTRATHECAL | Status: DC | PRN
  Administered 2020-06-16: 1.8 mL via INTRATHECAL

## 2020-06-16 MED ORDER — ALBUMIN HUMAN 5 % IV SOLN: INTRAVENOUS | Status: DC | PRN

## 2020-06-16 MED ORDER — LACTATED RINGERS IV SOLN: INTRAVENOUS | Status: DC

## 2020-06-16 MED ORDER — BUPIVACAINE-EPINEPHRINE (PF) 0.5% -1:200000 IJ SOLN
INTRAMUSCULAR | Status: AC
  Filled 2020-06-16: qty 30

## 2020-06-16 MED ORDER — ONDANSETRON HCL 4 MG/2ML IJ SOLN
INTRAMUSCULAR | Status: AC
  Filled 2020-06-16: qty 2

## 2020-06-16 MED ORDER — SENNOSIDES-DOCUSATE SODIUM 8.6-50 MG PO TABS: 1.0000 | ORAL_TABLET | Freq: Every evening | ORAL | Status: DC | PRN

## 2020-06-16 MED ORDER — CEFAZOLIN SODIUM-DEXTROSE 2-4 GM/100ML-% IV SOLN: 2.0000 g | Freq: Four times a day (QID) | INTRAVENOUS | Status: DC

## 2020-06-16 MED ORDER — KETOROLAC TROMETHAMINE 30 MG/ML IJ SOLN
INTRAMUSCULAR | Status: AC
  Filled 2020-06-16: qty 1

## 2020-06-16 MED ORDER — ONDANSETRON HCL 4 MG/2ML IJ SOLN: 4.0000 mg | Freq: Once | INTRAMUSCULAR | Status: DC | PRN

## 2020-06-16 MED ORDER — ONDANSETRON HCL 4 MG/2ML IJ SOLN
INTRAMUSCULAR | Status: DC | PRN
  Administered 2020-06-16: 4 mg via INTRAVENOUS

## 2020-06-16 SURGICAL SUPPLY — 65 items
ADH SKN CLS APL DERMABOND .7 (GAUZE/BANDAGES/DRESSINGS) ×1
APL PRP STRL LF DISP 70% ISPRP (MISCELLANEOUS) ×1
BAG DECANTER FOR FLEXI CONT (MISCELLANEOUS) IMPLANT
BAG SPEC THK2 15X12 ZIP CLS (MISCELLANEOUS)
BAG ZIPLOCK 12X15 (MISCELLANEOUS) IMPLANT
BLADE SURG SZ10 CARB STEEL (BLADE) IMPLANT
CHLORAPREP W/TINT 26 (MISCELLANEOUS) ×2 IMPLANT
COVER PERINEAL POST (MISCELLANEOUS) ×2 IMPLANT
COVER SURGICAL LIGHT HANDLE (MISCELLANEOUS) ×2 IMPLANT
COVER WAND RF STERILE (DRAPES) IMPLANT
CUP ACET PNNCL SECTR W/GRIP 56 (Hips) IMPLANT
DECANTER SPIKE VIAL GLASS SM (MISCELLANEOUS) ×2 IMPLANT
DERMABOND ADVANCED (GAUZE/BANDAGES/DRESSINGS) ×1
DERMABOND ADVANCED .7 DNX12 (GAUZE/BANDAGES/DRESSINGS) ×2 IMPLANT
DRAPE IMP U-DRAPE 54X76 (DRAPES) ×2 IMPLANT
DRAPE SHEET LG 3/4 BI-LAMINATE (DRAPES) ×6 IMPLANT
DRAPE STERI IOBAN 125X83 (DRAPES) IMPLANT
DRAPE U-SHAPE 47X51 STRL (DRAPES) ×4 IMPLANT
DRSG AQUACEL AG ADV 3.5X10 (GAUZE/BANDAGES/DRESSINGS) ×2 IMPLANT
ELECT REM PT RETURN 15FT ADLT (MISCELLANEOUS) ×2 IMPLANT
GAUZE SPONGE 4X4 12PLY STRL (GAUZE/BANDAGES/DRESSINGS) ×2 IMPLANT
GLOVE BIO SURGEON STRL SZ8.5 (GLOVE) ×4 IMPLANT
GLOVE BIOGEL M STRL SZ7.5 (GLOVE) ×4 IMPLANT
GLOVE BIOGEL PI IND STRL 8 (GLOVE) ×1 IMPLANT
GLOVE BIOGEL PI IND STRL 8.5 (GLOVE) ×1 IMPLANT
GLOVE BIOGEL PI INDICATOR 8 (GLOVE) ×1
GLOVE BIOGEL PI INDICATOR 8.5 (GLOVE) ×1
GOWN SPEC L3 XXLG W/TWL (GOWN DISPOSABLE) ×2 IMPLANT
GOWN STRL REUS W/ TWL LRG LVL3 (GOWN DISPOSABLE) ×1 IMPLANT
GOWN STRL REUS W/TWL LRG LVL3 (GOWN DISPOSABLE) ×2
HANDPIECE INTERPULSE COAX TIP (DISPOSABLE) ×2
HEAD CERAMIC BIOLOX 36 T1 +3 (Head) ×1 IMPLANT
HOLDER FOLEY CATH W/STRAP (MISCELLANEOUS) ×2 IMPLANT
HOOD PEEL AWAY FLYTE STAYCOOL (MISCELLANEOUS) ×8 IMPLANT
JET LAVAGE IRRISEPT WOUND (IRRIGATION / IRRIGATOR) ×2
KIT TURNOVER KIT A (KITS) IMPLANT
LAVAGE JET IRRISEPT WOUND (IRRIGATION / IRRIGATOR) ×1 IMPLANT
MANIFOLD NEPTUNE II (INSTRUMENTS) ×2 IMPLANT
MARKER SKIN DUAL TIP RULER LAB (MISCELLANEOUS) ×2 IMPLANT
NDL SAFETY ECLIPSE 18X1.5 (NEEDLE) ×1 IMPLANT
NDL SPNL 18GX3.5 QUINCKE PK (NEEDLE) ×1 IMPLANT
NEEDLE HYPO 18GX1.5 SHARP (NEEDLE) ×2
NEEDLE SPNL 18GX3.5 QUINCKE PK (NEEDLE) ×2 IMPLANT
PACK ANTERIOR HIP CUSTOM (KITS) ×2 IMPLANT
PENCIL SMOKE EVACUATOR (MISCELLANEOUS) IMPLANT
PINN SECTOR W/GRIP ACE CUP 56 (Hips) ×2 IMPLANT
PINNACLE ALTRX PLUS 4 N 36X56 (Hips) ×1 IMPLANT
SAW OSC TIP CART 19.5X105X1.3 (SAW) ×2 IMPLANT
SEALER BIPOLAR AQUA 6.0 (INSTRUMENTS) ×2 IMPLANT
SET HNDPC FAN SPRY TIP SCT (DISPOSABLE) ×1 IMPLANT
STAPLER VISISTAT 35W (STAPLE) ×1 IMPLANT
STEM FEM CEMTL 133D 14X113X196 (Stem) ×1 IMPLANT
SUT ETHIBOND NAB CT1 #1 30IN (SUTURE) ×4 IMPLANT
SUT MNCRL AB 3-0 PS2 18 (SUTURE) ×2 IMPLANT
SUT MNCRL AB 4-0 PS2 18 (SUTURE) ×2 IMPLANT
SUT MON AB 2-0 CT1 36 (SUTURE) ×4 IMPLANT
SUT STRATAFIX PDO 1 14 VIOLET (SUTURE) ×2
SUT STRATFX PDO 1 14 VIOLET (SUTURE) ×1
SUT VIC AB 2-0 CT1 27 (SUTURE) ×2
SUT VIC AB 2-0 CT1 TAPERPNT 27 (SUTURE) ×1 IMPLANT
SUTURE STRATFX PDO 1 14 VIOLET (SUTURE) ×1 IMPLANT
SYR 3ML LL SCALE MARK (SYRINGE) ×2 IMPLANT
TRAY FOLEY MTR SLVR 16FR STAT (SET/KITS/TRAYS/PACK) IMPLANT
TUBE SUCTION HIGH CAP CLEAR NV (SUCTIONS) ×2 IMPLANT
WATER STERILE IRR 1000ML POUR (IV SOLUTION) ×2 IMPLANT

## 2020-06-16 NOTE — Progress Notes (Signed)
OT Cancellation Note  Patient Details Name: [name] MRN: [id number] DOB: March 26, 1951   Cancelled Treatment:    Reason Eval/Treat Not Completed: Patient not medically ready. Patient did not have surgery yesterday. Planned surgery this afternoon. Will hold therapy evaluation and f/u tomorrow.  Zalia Hautala L Leann Mayweather 06/16/2020, 8:05 AM

## 2020-06-16 NOTE — Anesthesia Preprocedure Evaluation (Addendum)
Anesthesia Evaluation  Patient identified by MRN, date of birth, ID band Patient awake    Reviewed: NPO status , Patient's Chart, lab work & pertinent test results, reviewed documented beta blocker date and time   Airway Mallampati: II  TM Distance: >3 FB Neck ROM: Full    Dental  (+) Teeth Intact   Pulmonary sleep apnea and Continuous Positive Airway Pressure Ventilation , COPD,  COPD inhaler,    Pulmonary exam normal        Cardiovascular hypertension, Pt. on medications and Pt. on home beta blockers + CAD and +CHF   Rhythm:Regular Rate:Normal     Neuro/Psych TIAnegative psych ROS   GI/Hepatic Neg liver ROS, GERD  Medicated and Controlled,  Endo/Other  diabetes, Type 2, Insulin Dependent, Oral Hypoglycemic Agents  Renal/GU CRFRenal disease  negative genitourinary   Musculoskeletal Left femoral neck fx   Abdominal (+)  Abdomen: soft. Bowel sounds: normal.  Peds  Hematology negative hematology ROS (+)   Anesthesia Other Findings   Reproductive/Obstetrics                            Anesthesia Physical Anesthesia Plan  ASA: III  Anesthesia Plan: Spinal and MAC   Post-op Pain Management:    Induction: Intravenous  PONV Risk Score and Plan: 1 and Ondansetron, Treatment may vary due to age or medical condition and Propofol infusion  Airway Management Planned: Simple Face Mask, Natural Airway and Nasal Cannula  Additional Equipment: None  Intra-op Plan:   Post-operative Plan: Extubation in OR  Informed Consent: I have reviewed the patients History and Physical, chart, labs and discussed the procedure including the risks, benefits and alternatives for the proposed anesthesia with the patient or authorized representative who has indicated his/her understanding and acceptance.     Dental advisory given  Plan Discussed with: CRNA  Anesthesia Plan Comments: (ECHO 06/21: 1. Left  ventricular ejection fraction, by estimation, is 45 to 50%. The  left ventricle has mildly decreased function. The left ventricle has no  regional wall motion abnormalities. Left ventricular diastolic parameters  are consistent with Grade I  diastolic dysfunction (impaired relaxation).  2. Right ventricular systolic function is normal. The right ventricular  size is normal. Tricuspid regurgitation signal is inadequate for assessing  PA pressure.  3. The mitral valve is grossly normal. Mild mitral valve regurgitation.  No evidence of mitral stenosis.  4. The aortic valve is tricuspid. Aortic valve regurgitation is mild to  moderate. Mild to moderate aortic valve sclerosis/calcification is  present, without any evidence of aortic stenosis.  5. There is mild dilatation of the ascending aorta measuring 42 mm.  6. The inferior vena cava is normal in size with greater than 50%  respiratory variability, suggesting right atrial pressure of 3 mmHg.  Lab Results      Component                Value               Date                      WBC                      11.3 (H)            06/16/2020                HGB  10.3 (L)            06/16/2020                HCT                      31.7 (L)            06/16/2020                MCV                      90.3                06/16/2020                PLT                      261                 06/16/2020           Lab Results      Component                Value               Date                      NA                       138                 06/16/2020                K                        3.6                 06/16/2020                CO2                      29                  06/16/2020                GLUCOSE                  143 (H)             06/16/2020                BUN                      31 (H)              06/16/2020                CREATININE               1.46 (H)            06/16/2020                 CALCIUM                  8.2 (L)             06/16/2020  GFRNONAA                 51 (L)              06/16/2020                GFRAA                    49 (L)              02/23/2020          )     Anesthesia Quick Evaluation

## 2020-06-16 NOTE — Transfer of Care (Signed)
Immediate Anesthesia Transfer of Care Note  Patient: [name]  Procedure(s) Performed: TOTAL HIP ARTHROPLASTY ANTERIOR APPROACH (Left Hip)  Patient Location: PACU  Anesthesia Type:Spinal  Level of Consciousness: awake and patient cooperative  Airway & Oxygen Therapy: Patient Spontanous Breathing and Patient connected to face mask oxygen  Post-op Assessment: Report given to RN, Post -op Vital signs reviewed and stable and Patient moving all extremities X 4  Post vital signs: stable  Last Vitals:  Vitals Value Taken Time  BP 153/79 06/16/20 1732  Temp 36.7 C 06/16/20 1732  Pulse 91 06/16/20 1736  Resp 19 06/16/20 1736  SpO2 95 % 06/16/20 1736  Vitals shown include unvalidated device data.  Last Pain:  Vitals:   06/16/20 1732  TempSrc:   PainSc: Asleep      Patients Stated Pain Goal: 2 (06/16/20 0542)  Complications: No complications documented.

## 2020-06-16 NOTE — Anesthesia Postprocedure Evaluation (Signed)
Anesthesia Post Note  Patient: Moua Rasmusson  Procedure(s) Performed: TOTAL HIP ARTHROPLASTY ANTERIOR APPROACH (Left Hip)     Patient location during evaluation: PACU Anesthesia Type: General Level of consciousness: awake and alert Pain management: pain level controlled Vital Signs Assessment: post-procedure vital signs reviewed and stable Respiratory status: spontaneous breathing, nonlabored ventilation, respiratory function stable and patient connected to nasal cannula oxygen Cardiovascular status: blood pressure returned to baseline and stable Postop Assessment: no apparent nausea or vomiting Anesthetic complications: no   No complications documented.  Last Vitals:  Vitals:   06/16/20 1850 06/16/20 2002  BP: (!) 155/94 (!) 148/84  Pulse: 87 88  Resp: 18 16  Temp: 36.7 C 36.8 C  SpO2: 98% 98%    Last Pain:  Vitals:   06/16/20 2002  TempSrc: Oral  PainSc:                  Genese Quebedeaux DAVID

## 2020-06-16 NOTE — Op Note (Signed)
OPERATIVE REPORT  SURGEON: Rod Can, MD   ASSISTANT: Cherlynn June, PA-C  PREOPERATIVE DIAGNOSIS: Displaced Left femoral neck fracture.   POSTOPERATIVE DIAGNOSIS: Displaced Left femoral neck fracture.   PROCEDURE: Left total hip arthroplasty, anterior approach.   IMPLANTS: Biomet Taperloc microplasty stem, size 14 x 16m, high offset. DePuy Pinnacle Cup, size 56 mm. DePuy Altrx liner, size 36 by 56 mm, +4 neutral. Biomet Biolox ceramic head ball, size 36 + 3 mm.  ANESTHESIA:  MAC, General and Spinal  ANTIBIOTICS: 2g ancef.  ESTIMATED BLOOD LOSS:-350 mL    DRAINS: None.  COMPLICATIONS: None   CONDITION: PACU - hemodynamically stable.   BRIEF CLINICAL NOTE: Harold Westrupis a 69y.o. male with a displaced Left femoral neck fracture. The patient was admitted to the hospitalist service and underwent perioperative risk stratification and medical optimization. The risks, benefits, and alternatives to total hip arthroplasty were explained, and the patient elected to proceed.  PROCEDURE IN DETAIL: The patient was taken to the operating room and general anesthesia was induced on the hospital bed.  SPinal was not adequate, and therefore general anesthsia was obtained. The patient was then positioned on the Hana table.  All bony prominences were well padded.  The hip was prepped and draped in the normal sterile surgical fashion.  A time-out was called verifying side and site of surgery. Antibiotics were given within 60 minutes of beginning the procedure.  The direct anterior approach to the hip was performed through the Hueter interval.  Lateral femoral circumflex vessels were treated with the Auqumantys. The anterior capsule was exposed and an inverted T capsulotomy was made.  Fracture hematoma was encountered and evacuated. The patient was found to have a comminuted Left subcapital femoral neck fracture.  I freshened the femoral neck cut with a saw.  I removed the femoral neck  fragment.  A corkscrew was placed into the head and the head was removed.  This was passed to the back table and was measured.   Acetabular exposure was achieved, and the pulvinar and labrum were excised. Sequential reaming of the acetabulum was then performed up to a size 55 mm reamer. A 56 mm cup was then opened and impacted into place at approximately 40 degrees of abduction and 20 degrees of anteversion. The final polyethylene liner was impacted into place.    I then gained femoral exposure taking care to protect the abductors and greater trochanter.  This was performed using standard external rotation, extension, and adduction.  The capsule was peeled off the inner aspect of the greater trochanter, taking care to preserve the short external rotators. A cookie cutter was used to enter the femoral canal, and then the femoral canal finder was used to confirm location.  I then sequentially broached up to a size 14.  Calcar planer was used on the femoral neck remnant.  I paced a high offset neck and a trial head ball. The hip was reduced.  Leg lengths were checked fluoroscopically.  The hip was dislocated and trial components were removed.  I placed the real stem followed by the real head ball.  The hip was reduced.  Fluoroscopy was used to confirm component position and leg lengths.  At 90 degrees of external rotation and extension, the hip was stable to an anterior directed force.   The wound was copiously irrigated with Irrisept solution and normal saline using pule lavage.  Marcaine solution was injected into the periarticular soft tissue.  The wound was closed in layers  using #1 Vicryl and V-Loc for the fascia, 2-0 Vicryl for the subcutaneous fat, 2-0 Monocryl for the deep dermal layer, and staples + glue for the skin.  Once the glue was fully dried, an Aquacell Ag dressing was applied.  The patient was then awakened from anesthesia and transported to the recovery room in stable condition.  Sponge,  needle, and instrument counts were correct at the end of the case x2.  The patient tolerated the procedure well and there were no known complications.  Please note that a surgical assistant was a medical necessity for this procedure to perform it in a safe and expeditious manner. Assistant was necessary to provide appropriate retraction of vital neurovascular structures, to prevent femoral fracture, and to allow for anatomic placement of the prosthesis.

## 2020-06-16 NOTE — Discharge Instructions (Signed)
°Dr. Maxen Rowland °Joint Replacement Specialist °[facility] °[address] °[place], [place] [address] °[phone] ° ° °TOTAL HIP REPLACEMENT POSTOPERATIVE DIRECTIONS ° ° ° °Hip Rehabilitation, Guidelines Following Surgery  ° °WEIGHT BEARING °Weight bearing as tolerated with assist device (walker, cane, etc) as directed, use it as long as suggested by your surgeon or therapist, typically at least 4-6 weeks. ° °The results of a hip operation are greatly improved after range of motion and muscle strengthening exercises. Follow all safety measures which are given to protect your hip. If any of these exercises cause increased pain or swelling in your joint, decrease the amount until you are comfortable again. Then slowly increase the exercises. Call your caregiver if you have problems or questions.  ° °HOME CARE INSTRUCTIONS  °Most of the following instructions are designed to prevent the dislocation of your new hip.  °Remove items at home which could result in a fall. This includes throw rugs or furniture in walking pathways.  °Continue medications as instructed at time of discharge. °· You may have some home medications which will be placed on hold until you complete the course of blood thinner medication. °· You may start showering once you are discharged home. Do not remove your dressing. °Do not put on socks or shoes without following the instructions of your caregivers.   °Sit on chairs with arms. Use the chair arms to help push yourself up when arising.  °Arrange for the use of a toilet seat elevator so you are not sitting low.  °· Walk with walker as instructed.  °You may resume a sexual relationship in one month or when given the OK by your caregiver.  °Use walker as long as suggested by your caregivers.  °You may put full weight on your legs and walk as much as is comfortable. °Avoid periods of inactivity such as sitting longer than an hour when not asleep. This helps prevent  blood clots.  °You may return to work once you are cleared by your surgeon.  °Do not drive a car for 6 weeks or until released by your surgeon.  °Do not drive while taking narcotics.  °Wear elastic stockings for two weeks following surgery during the day but you may remove then at night.  °Make sure you keep all of your appointments after your operation with all of your doctors and caregivers. You should call the office at the above phone number and make an appointment for approximately two weeks after the date of your surgery. °Please pick up a stool softener and laxative for home use as long as you are requiring pain medications. °· ICE to the affected hip every three hours for 30 minutes at a time and then as needed for pain and swelling. Continue to use ice on the hip for pain and swelling from surgery. You may notice swelling that will progress down to the foot and ankle.  This is normal after surgery.  Elevate the leg when you are not up walking on it.   °It is important for you to complete the blood thinner medication as prescribed by your doctor. °· Continue to use the breathing machine which will help keep your temperature down.  It is common for your temperature to cycle up and down following surgery, especially at night when you are not up moving around and exerting yourself.  The breathing machine keeps your lungs expanded and your temperature down. ° °RANGE OF MOTION AND STRENGTHENING EXERCISES  °These exercises are   designed to help you keep full movement of your hip joint. Follow your caregiver's or physical therapist's instructions. Perform all exercises about fifteen times, three times per day or as directed. Exercise both hips, even if you have had only one joint replacement. These exercises can be done on a training (exercise) mat, on the floor, on a table or on a bed. Use whatever works the best and is most comfortable for you. Use music or television while you are exercising so that the exercises  are a pleasant break in your day. This will make your life better with the exercises acting as a break in routine you can look forward to.  °Lying on your back, slowly slide your foot toward your buttocks, raising your knee up off the floor. Then slowly slide your foot back down until your leg is straight again.  °Lying on your back spread your legs as far apart as you can without causing discomfort.  °Lying on your side, raise your upper leg and foot straight up from the floor as far as is comfortable. Slowly lower the leg and repeat.  °Lying on your back, tighten up the muscle in the front of your thigh (quadriceps muscles). You can do this by keeping your leg straight and trying to raise your heel off the floor. This helps strengthen the largest muscle supporting your knee.  °Lying on your back, tighten up the muscles of your buttocks both with the legs straight and with the knee bent at a comfortable angle while keeping your heel on the floor.  ° °SKILLED REHAB INSTRUCTIONS: °If the patient is transferred to a skilled rehab facility following release from the hospital, a list of the current medications will be sent to the facility for the patient to continue.  When discharged from the skilled rehab facility, please have the facility set up the patient's Home Health Physical Therapy prior to being released. Also, the skilled facility will be responsible for providing the patient with their medications at time of release from the facility to include their pain medication and their blood thinner medication. If the patient is still at the rehab facility at time of the two week follow up appointment, the skilled rehab facility will also need to assist the patient in arranging follow up appointment in our office and any transportation needs. ° °MAKE SURE YOU:  °Understand these instructions.  °Will watch your condition.  °Will get help right away if you are not doing well or get worse. ° °Pick up stool softner and  laxative for home use following surgery while on pain medications. °Do not remove your dressing. °The dressing is waterproof--it is OK to take showers. °Continue to use ice for pain and swelling after surgery. °Do not use any lotions or creams on the incision until instructed by your surgeon. °Total Hip Protocol. ° ° °

## 2020-06-16 NOTE — Interval H&P Note (Signed)
History and Physical Interval Note:  06/16/2020 1:01 PM  [name]  has presented today for surgery, with the diagnosis of LEFT FEMORAL NECK FRACTURE.  The various methods of treatment have been discussed with the patient and family. After consideration of risks, benefits and other options for treatment, the patient has consented to  Procedure(s): TOTAL HIP ARTHROPLASTY ANTERIOR APPROACH (Left) as a surgical intervention.  The patient's history has been reviewed, patient examined, no change in status, stable for surgery.  I have reviewed the patient's chart and labs.  Questions were answered to the patient's satisfaction.    The risks, benefits, and alternatives were discussed with the patient. There are risks associated with the surgery including, but not limited to, problems with anesthesia (death), infection, instability (giving out of the joint), dislocation, differences in leg length/angulation/rotation, fracture of bones, loosening or failure of implants, hematoma (blood accumulation) which may require surgical drainage, blood clots, pulmonary embolism, nerve injury (foot drop and lateral thigh numbness), and blood vessel injury. The patient understands these risks and elects to proceed.    [name]

## 2020-06-16 NOTE — Anesthesia Procedure Notes (Addendum)
Procedure Name: Intubation Date/Time: 06/16/2020 3:32 PM Performed by: Lissa Morales, CRNA Pre-anesthesia Checklist: Patient identified, Emergency Drugs available, Suction available and Patient being monitored Patient Re-evaluated:Patient Re-evaluated prior to induction Oxygen Delivery Method: Circle system utilized Preoxygenation: Pre-oxygenation with 100% oxygen Induction Type: IV induction, Cricoid Pressure applied and Rapid sequence Ventilation: Mask ventilation without difficulty Laryngoscope Size: Mac and 4 Grade View: Grade II Tube type: Oral Tube size: 7.0 mm Number of attempts: 1 Airway Equipment and Method: Stylet and Oral airway Placement Confirmation: ETT inserted through vocal cords under direct vision,  positive ETCO2 and breath sounds checked- equal and bilateral Secured at: 23 cm Tube secured with: Tape Dental Injury: Teeth and Oropharynx as per pre-operative assessment  Comments: Spinal not working. Proceeded to GA. RSI good visualization of cords

## 2020-06-16 NOTE — Anesthesia Procedure Notes (Signed)
Procedure Name: MAC Date/Time: 06/16/2020 2:30 PM Performed by: Lissa Morales, CRNA Pre-anesthesia Checklist: Emergency Drugs available, Patient identified, Suction available, Patient being monitored and Timeout performed Patient Re-evaluated:Patient Re-evaluated prior to induction Oxygen Delivery Method: Simple face mask Placement Confirmation: positive ETCO2

## 2020-06-16 NOTE — Progress Notes (Signed)
PROGRESS NOTE    [name]  NOB:[id number] DOB: 03-Jun-1951 DOA: 06/14/2020 PCP: [facility]    Brief Narrative:  This 69 years old male with history of DM 2, hypertension, CHF, CAD, [name] stage IIIa admitted status post mechanical fall with left hip fracture. Patient was also recently diagnosed with community-acquired pneumonia and was prescribed doxycycline.  Orthopedics consulted ,  scheduled to have left total hip arthroplasty 12/16.  Assessment & Plan:   Principal Problem:   Fracture of femoral neck, left, closed (HCC) Active Problems:   HTN (hypertension)   Chronic systolic heart failure (HCC)   Diabetes mellitus type 2, controlled, without complications (HCC)   OSA (obstructive sleep apnea)   CAP (community acquired pneumonia)   [name] (chronic kidney disease), stage III (HCC)   Prolonged QT interval  Fracture of femoral neck left closed: Patient is s/p mechanical fall with left femoral neck fracture. Orthopedic consulted , Scheduled to have left total hip arthroplasty 12/16. Adequate pain control, NPO midnight PT/ OT evaluation after surgery.  Community-acquired pneumonia: Infiltrate seen on chest x-ray with elevated WBC.  Elevated WBC could also be reactive.   Currently on doxycycline.  Incentive spirometer, flutter valve when out of bed to chair. Bronchodilators as needed. Procalcitonin 0.13.  Prolonged QTC- Avoid QT prolongation medications.  Repeat EKG in a.m.  Hypertension:  Continue Home blood pressure medications.  Diabetes mellitus type 2- insulin sliding scale and Accu-Chek, Metformin on hold.  Congestive heart failure with reduced EF, EF 45%, grade 1 DD-appears euvolemic.   Closely monitor.  Continue home medicines  Peripheral neuropathy-continue gabapentin  [name] stage IIIa:  renal functions are stable.  Obstructive sleep apnea: Continue CPAP at night.   DVT prophylaxis: Lovenox Code Status: Full code Family Communication:  No family at bedside Disposition Plan: Status is: Inpatient  Remains inpatient appropriate because:Inpatient level of care appropriate due to severity of illness   Dispo: The patient is from: Home              Anticipated d/c is to: SNF              Anticipated d/c date is: 2 days              Patient currently is not medically stable to d/c.   Consultants:   Orthopedics  Procedures:  Antimicrobials:  Anti-infectives (From admission, onward)   Start     Dose/Rate Route Frequency Ordered Stop   06/16/20 1245  ceFAZolin (ANCEF) IVPB 2g/100 mL premix        2 g 200 mL/hr over 30 Minutes Intravenous On call to O.R. 06/16/20 1236 06/17/20 0559   06/15/20 1400  ceFAZolin (ANCEF) IVPB 2g/100 mL premix        2 g 200 mL/hr over 30 Minutes Intravenous On call to O.R. 06/14/20 2254 06/16/20 0559   06/15/20 0600  [MAR Hold]  cefTRIAXone (ROCEPHIN) 2 g in sodium chloride 0.9 % 100 mL IVPB        (MAR Hold since Thu 06/16/2020 at 1233.Hold Reason: Transfer to a Procedural area.)   2 g 200 mL/hr over 30 Minutes Intravenous Every 24 hours 06/14/20 0620     06/14/20 1000  [MAR Hold]  doxycycline (VIBRA-TABS) tablet 100 mg        (MAR Hold since Thu 06/16/2020 at 1233.Hold Reason: Transfer to a Procedural area.)   100 mg Oral Every 12 hours 06/14/20 0614     06/14/20 0615  cefTRIAXone (ROCEPHIN) 1 g  in sodium chloride 0.9 % 100 mL IVPB  Status:  Discontinued        1 g 200 mL/hr over 30 Minutes Intravenous Every 24 hours 06/14/20 0614 06/14/20 0619   06/14/20 0400  cefTRIAXone (ROCEPHIN) 1 g in sodium chloride 0.9 % 100 mL IVPB        1 g 200 mL/hr over 30 Minutes Intravenous  Once 06/14/20 0349 06/14/20 0613   06/14/20 0400  azithromycin (ZITHROMAX) 500 mg in sodium chloride 0.9 % 250 mL IVPB        500 mg 250 mL/hr over 60 Minutes Intravenous  Once 06/14/20 0349 06/14/20 0732     Subjective: Seen and examined at bedside.  Overnight events noted,  Patient reports feeling better rested  pain is better controlled with pain medication. He is going to have surgery scheduled today afternoon.  Objective: Vitals:   06/16/20 0527 06/16/20 0853 06/16/20 0932 06/16/20 1234  BP: 135/77  (!) 150/82 (!) 162/97  Pulse: 86  84 97  Resp: 16  16 18   Temp: (!) 97.4 F (36.3 C)  98 F (36.7 C) 98.2 F (36.8 C)  TempSrc: Oral  Oral Oral  SpO2: 98% 98% 98% 93%  Weight:      Height:        Intake/Output Summary (Last 24 hours) at 06/16/2020 1450 Last data filed at 06/16/2020 1213 Gross per 24 hour  Intake 700 ml  Output 1900 ml  Net -1200 ml   Filed Weights   06/14/20 2247  Weight: 91.7 kg    Examination:  General exam: Appears calm and comfortable , Not in any distress. Respiratory system: Clear to auscultation. Respiratory effort normal. Cardiovascular system: S1 & S2 heard, RRR. No JVD, murmurs, rubs, gallops or clicks. No pedal edema. Gastrointestinal system: Abdomen is nondistended, soft and nontender. No organomegaly or masses felt. Normal bowel sounds heard. Central nervous system: Alert and oriented. No focal neurological deficits. Extremities: Left hip tenderness noted.  Skin: No rashes, lesions or ulcers Psychiatry: Judgement and insight appear normal. Mood & affect appropriate.     Data Reviewed: I have personally reviewed following labs and imaging studies  CBC: Recent Labs  Lab 06/14/20 0133 06/14/20 0727 06/16/20 0328  WBC 16.5* 12.6* 11.3*  NEUTROABS 15.0*  --   --   HGB 11.4* 10.8* 10.3*  HCT 36.1* 33.9* 31.7*  MCV 89.4 90.2 90.3  PLT 358 329 261   Basic Metabolic Panel: Recent Labs  Lab 06/14/20 0133 06/14/20 0727 06/16/20 0328  NA 137  --  138  K 3.7  --  3.6  CL 98  --  99  CO2 24  --  29  GLUCOSE 257*  --  143*  BUN 44*  --  31*  CREATININE 1.94* 1.63* 1.46*  CALCIUM 8.7*  --  8.2*  MG  --   --  1.7   GFR: Estimated Creatinine Clearance: 50.8 mL/min (A) (by C-G formula based on SCr of 1.46 mg/dL (H)). Liver Function  Tests: No results for input(s): AST, ALT, ALKPHOS, BILITOT, PROT, ALBUMIN in the last 168 hours. No results for input(s): LIPASE, AMYLASE in the last 168 hours. No results for input(s): AMMONIA in the last 168 hours. Coagulation Profile: Recent Labs  Lab 06/14/20 0133  INR 1.0   Cardiac Enzymes: No results for input(s): CKTOTAL, CKMB, CKMBINDEX, TROPONINI in the last 168 hours. BNP (last 3 results) No results for input(s): PROBNP in the last 8760 hours. HbA1C: Recent Labs  06/14/20 0727  HGBA1C 7.8*   CBG: Recent Labs  Lab 06/15/20 1200 06/15/20 1636 06/15/20 2151 06/16/20 0727 06/16/20 1130  GLUCAP 129* 179* 233* 140* 152*   Lipid Profile: No results for input(s): CHOL, HDL, LDLCALC, TRIG, CHOLHDL, LDLDIRECT in the last 72 hours. Thyroid Function Tests: Recent Labs    06/14/20 1145  TSH 1.833   Anemia Panel: No results for input(s): VITAMINB12, FOLATE, FERRITIN, TIBC, IRON, RETICCTPCT in the last 72 hours. Sepsis Labs: Recent Labs  Lab 06/14/20 0133  PROCALCITON 0.13    Recent Results (from the past 240 hour(s))  Resp Panel by RT-PCR (Flu A&B, Covid) Nasopharyngeal Swab     Status: None   Collection Time: 06/14/20  2:28 AM   Specimen: Nasopharyngeal Swab; Nasopharyngeal(NP) swabs in vial transport medium  Result Value Ref Range Status   SARS Coronavirus 2 by RT PCR NEGATIVE NEGATIVE Final    Comment: (NOTE) SARS-CoV-2 target nucleic acids are NOT DETECTED.  The SARS-CoV-2 RNA is generally detectable in upper respiratory specimens during the acute phase of infection. The lowest concentration of SARS-CoV-2 viral copies this assay can detect is 138 copies/mL. A negative result does not preclude SARS-Cov-2 infection and should not be used as the sole basis for treatment or other patient management decisions. A negative result may occur with  improper specimen collection/handling, submission of specimen other than nasopharyngeal swab, presence of viral  mutation(s) within the areas targeted by this assay, and inadequate number of viral copies(<138 copies/mL). A negative result must be combined with clinical observations, patient history, and epidemiological information. The expected result is Negative.  Fact Sheet for Patients:  BloggerCourse.com  Fact Sheet for Healthcare Providers:  SeriousBroker.it  This test is no t yet approved or cleared by the [place] FDA and  has been authorized for detection and/or diagnosis of SARS-CoV-2 by FDA under an Emergency Use Authorization (EUA). This EUA will remain  in effect (meaning this test can be used) for the duration of the COVID-19 declaration under Section 564(b)(1) of the Act, 21 U.S.C.section 360bbb-3(b)(1), unless the authorization is terminated  or revoked sooner.       Influenza A by PCR NEGATIVE NEGATIVE Final   Influenza B by PCR NEGATIVE NEGATIVE Final    Comment: (NOTE) The Xpert Xpress SARS-CoV-2/FLU/RSV plus assay is intended as an aid in the diagnosis of influenza from Nasopharyngeal swab specimens and should not be used as a sole basis for treatment. Nasal washings and aspirates are unacceptable for Xpert Xpress SARS-CoV-2/FLU/RSV testing.  Fact Sheet for Patients: BloggerCourse.com  Fact Sheet for Healthcare Providers: SeriousBroker.it  This test is not yet approved or cleared by the [place] FDA and has been authorized for detection and/or diagnosis of SARS-CoV-2 by FDA under an Emergency Use Authorization (EUA). This EUA will remain in effect (meaning this test can be used) for the duration of the COVID-19 declaration under Section 564(b)(1) of the Act, 21 U.S.C. section 360bbb-3(b)(1), unless the authorization is terminated or revoked.  Performed at [facility], [address] [place], [place] [address]   Surgical PCR screen     Status: None    Collection Time: 06/14/20 11:30 PM   Specimen: Nasal Mucosa; Nasal Swab  Result Value Ref Range Status   MRSA, PCR NEGATIVE NEGATIVE Final   Staphylococcus aureus NEGATIVE NEGATIVE Final    Comment: (NOTE) The Xpert SA Assay (FDA approved for NASAL specimens in patients 35 years of age and older), is one component of a comprehensive  surveillance program. It is not intended to diagnose infection nor to guide or monitor treatment. Performed at [facility], [address] [place], [place] [address]     Radiology Studies: No results found.  Scheduled Meds: . [MAR Hold] atorvastatin  40 mg Oral QPM  . [MAR Hold] carvedilol  12.5 mg Oral BID WC  . chlorhexidine  60 mL Topical Once  . [MAR Hold] Chlorhexidine Gluconate Cloth  6 each Topical Daily  . [MAR Hold] doxycycline  100 mg Oral Q12H  . [MAR Hold] famotidine  20 mg Oral QHS  . [MAR Hold] fenofibrate  54 mg Oral Daily  . [MAR Hold] finasteride  5 mg Oral Daily  . [MAR Hold] fluticasone furoate-vilanterol  1 puff Inhalation Daily  . [MAR Hold] gabapentin  400 mg Oral BID  . [MAR Hold] insulin aspart  0-15 Units Subcutaneous TID WC  . [MAR Hold] insulin aspart  0-5 Units Subcutaneous QHS  . [MAR Hold] tamsulosin  0.4 mg Oral Daily  . [MAR Hold] torsemide  20 mg Oral BID   Continuous Infusions: .  ceFAZolin (ANCEF) IV    . [MAR Hold] cefTRIAXone (ROCEPHIN)  IV 2 g (06/16/20 0516)  . lactated ringers 50 mL/hr at 06/16/20 1245     LOS: 2 days    Time spent: 25 mins.    Cipriano BunkerPARDEEP Gwendolin Briel, MD [facility]   If 7PM-7AM, please contact night-coverage

## 2020-06-16 NOTE — Plan of Care (Signed)
  Problem: Education: Goal: Knowledge of General Education information will improve Description: Including pain rating scale, medication(s)/side effects and non-pharmacologic comfort measures Outcome: Progressing   Problem: Coping: Goal: Level of anxiety will decrease Outcome: Progressing   Problem: Pain Managment: Goal: General experience of comfort will improve Outcome: Progressing   

## 2020-06-16 NOTE — Anesthesia Procedure Notes (Addendum)
Spinal  Patient location during procedure: OR Start time: 06/16/2020 2:45 PM End time: 06/16/2020 2:49 PM Staffing Performed: anesthesiologist  Anesthesiologist: Atilano Median, DO Preanesthetic Checklist Completed: patient identified, IV checked, site marked, risks and benefits discussed, surgical consent, monitors and equipment checked, pre-op evaluation and timeout performed Spinal Block Patient position: left lateral decubitus Prep: DuraPrep Patient monitoring: heart rate, cardiac monitor, continuous pulse ox and blood pressure Approach: midline Location: L3-4 Injection technique: single-shot Needle Needle type: Pencan  Needle gauge: 24 G Needle length: 10 cm Additional Notes Patient identified. Risks/Benefits/Options discussed with patient including but not limited to bleeding, infection, nerve damage, paralysis, failed block, incomplete pain control, headache, blood pressure changes, nausea, vomiting, reactions to medications, itching and postpartum back pain. Confirmed with bedside nurse the patient's most recent platelet count. Confirmed with patient that they are not currently taking any anticoagulation, have any bleeding history or any family history of bleeding disorders. Patient expressed understanding and wished to proceed. All questions were answered. Sterile technique was used throughout the entire procedure. Please see nursing notes for vital signs. Warning signs of high block given to the patient including shortness of breath, tingling/numbness in hands, complete motor block, or any concerning symptoms with instructions to call for help. Patient was given instructions on fall risk and not to get out of bed. All questions and concerns addressed with instructions to call with any issues or inadequate analgesia.

## 2020-06-17 ENCOUNTER — Encounter (HOSPITAL_COMMUNITY): Payer: Self-pay | Admitting: Orthopedic Surgery

## 2020-06-17 LAB — CBC
HCT: 30.3 % — ABNORMAL LOW (ref 39.0–52.0)
Hemoglobin: 9.6 g/dL — ABNORMAL LOW (ref 13.0–17.0)
MCH: 29.3 pg (ref 26.0–34.0)
MCHC: 31.7 g/dL (ref 30.0–36.0)
MCV: 92.4 fL (ref 80.0–100.0)
Platelets: 250 10*3/uL (ref 150–400)
RBC: 3.28 MIL/uL — ABNORMAL LOW (ref 4.22–5.81)
RDW: 13.9 % (ref 11.5–15.5)
WBC: 13 10*3/uL — ABNORMAL HIGH (ref 4.0–10.5)
nRBC: 0 % (ref 0.0–0.2)

## 2020-06-17 LAB — PHOSPHORUS: Phosphorus: 4.4 mg/dL (ref 2.5–4.6)

## 2020-06-17 LAB — BASIC METABOLIC PANEL
Anion gap: 13 (ref 5–15)
BUN: 34 mg/dL — ABNORMAL HIGH (ref 8–23)
CO2: 25 mmol/L (ref 22–32)
Calcium: 8.1 mg/dL — ABNORMAL LOW (ref 8.9–10.3)
Chloride: 99 mmol/L (ref 98–111)
Creatinine, Ser: 1.41 mg/dL — ABNORMAL HIGH (ref 0.61–1.24)
GFR, Estimated: 54 mL/min — ABNORMAL LOW (ref >60)
Glucose, Bld: 189 mg/dL — ABNORMAL HIGH (ref 70–99)
Potassium: 4.1 mmol/L (ref 3.5–5.1)
Sodium: 137 mmol/L (ref 135–145)

## 2020-06-17 LAB — GLUCOSE, CAPILLARY
Glucose-Capillary: 153 mg/dL — ABNORMAL HIGH (ref 70–99)
Glucose-Capillary: 169 mg/dL — ABNORMAL HIGH (ref 70–99)
Glucose-Capillary: 186 mg/dL — ABNORMAL HIGH (ref 70–99)
Glucose-Capillary: 178 mg/dL — ABNORMAL HIGH (ref 70–99)

## 2020-06-17 LAB — MAGNESIUM: Magnesium: 1.7 mg/dL (ref 1.7–2.4)

## 2020-06-17 MED ORDER — SENNOSIDES-DOCUSATE SODIUM 8.6-50 MG PO TABS
1.0000 | ORAL_TABLET | Freq: Two times a day (BID) | ORAL | Status: AC
  Administered 2020-06-17 – 2020-06-18 (×3): 1 via ORAL
  Filled 2020-06-17 (×3): qty 1

## 2020-06-17 MED ORDER — ASPIRIN 81 MG PO CHEW: 81.0000 mg | CHEWABLE_TABLET | Freq: Two times a day (BID) | ORAL | 0 refills | Status: AC

## 2020-06-17 MED ORDER — HYDROCODONE-ACETAMINOPHEN 5-325 MG PO TABS: 1.0000 | ORAL_TABLET | Freq: Four times a day (QID) | ORAL | 0 refills | Status: DC | PRN

## 2020-06-17 NOTE — Care Management Important Message (Signed)
Important Message  Patient Details IM Letter given to the Patient Name: [name] MRN: [id number] Date of Birth: 16-Apr-1951   Medicare Important Message Given:  Yes     Caren Macadam 06/17/2020, 11:05 AM

## 2020-06-17 NOTE — Progress Notes (Addendum)
PROGRESS NOTE    [name]  NUU:[id number] DOB: 03/17/1951 DOA: 06/14/2020 PCP: [facility]    Brief Narrative:  This 69 years old male with history of DM 2, hypertension, CHF, CAD, [name] stage IIIa admitted status post mechanical fall with left hip fracture. Patient was also recently diagnosed with community-acquired pneumonia and was prescribed doxycycline.  Orthopedics consulted ,   He underwent  left total hip arthroplasty 12/16,  tolerated well,  Ortho recommended weightbearing as tolerated.  PT recommended home with home PT.  Assessment & Plan:   Principal Problem:   Fracture of femoral neck, left, closed (HCC) Active Problems:   HTN (hypertension)   Chronic systolic heart failure (HCC)   Diabetes mellitus type 2, controlled, without complications (HCC)   OSA (obstructive sleep apnea)   CAP (community acquired pneumonia)   [name] (chronic kidney disease), stage III (HCC)   Prolonged QT interval  Fracture of femoral neck left closed: Patient is s/p mechanical fall with left femoral neck fracture. Orthopedic consulted , underwent left total hip arthroplasty 12/16. Adequate pain control with oxycodone as needed. PT/ OT: Recommended skilled nursing facility but patient prefers home with home PT. Ortho advised weight bearing as tolerated, pain control.  Community-acquired pneumonia: Infiltrate seen on chest x-ray with elevated WBC.  Elevated WBC could also be reactive.   Currently on doxycycline.  Incentive spirometer, flutter valve when out of bed to chair.  Bronchodilators as needed. Procalcitonin 0.13. Finish antibiotics course.  Prolonged QTC- Avoid QT prolongation medications.   Recheck EKG   Hypertension:  Continue Home blood pressure medications.  Diabetes mellitus type 2- insulin sliding scale and Accu-Chek, Metformin on hold.  Congestive heart failure with reduced EF: EF 45%, grade 1 DD-appears euvolemic.   Closely monitor.  Continue  home medicines.  Peripheral neuropathy-Continue gabapentin  [name] stage IIIa:  renal functions are stable.  Obstructive sleep apnea: Continue CPAP at night.   DVT prophylaxis: Lovenox Code Status: Full code Family Communication: No family at bedside Disposition Plan: Status is: Inpatient  Remains inpatient appropriate because:Inpatient level of care appropriate due to severity of illness   Dispo: The patient is from: Home              Anticipated d/c is HK:VQQV with home PT              Anticipated d/c date is: 12/18              Patient currently is not medically stable to d/c.   Consultants:   Orthopedics  Procedures:  Antimicrobials:  Anti-infectives (From admission, onward)   Start     Dose/Rate Route Frequency Ordered Stop   06/16/20 2015  ceFAZolin (ANCEF) IVPB 2g/100 mL premix  Status:  Discontinued        2 g 200 mL/hr over 30 Minutes Intravenous Every 6 hours 06/16/20 1922 06/16/20 2047   06/16/20 1245  ceFAZolin (ANCEF) IVPB 2g/100 mL premix  Status:  Discontinued        2 g 200 mL/hr over 30 Minutes Intravenous On call to O.R. 06/16/20 1236 06/16/20 1857   06/15/20 1400  ceFAZolin (ANCEF) IVPB 2g/100 mL premix  Status:  Discontinued        2 g 200 mL/hr over 30 Minutes Intravenous On call to O.R. 06/14/20 2254 06/16/20 0559   06/15/20 0600  cefTRIAXone (ROCEPHIN) 2 g in sodium chloride 0.9 % 100 mL IVPB        2 g 200 mL/hr  over 30 Minutes Intravenous Every 24 hours 06/14/20 0620     06/14/20 1000  doxycycline (VIBRA-TABS) tablet 100 mg        100 mg Oral Every 12 hours 06/14/20 0614     06/14/20 0615  cefTRIAXone (ROCEPHIN) 1 g in sodium chloride 0.9 % 100 mL IVPB  Status:  Discontinued        1 g 200 mL/hr over 30 Minutes Intravenous Every 24 hours 06/14/20 0614 06/14/20 0619   06/14/20 0400  cefTRIAXone (ROCEPHIN) 1 g in sodium chloride 0.9 % 100 mL IVPB        1 g 200 mL/hr over 30 Minutes Intravenous  Once 06/14/20 0349 06/14/20 0613   06/14/20 0400   azithromycin (ZITHROMAX) 500 mg in sodium chloride 0.9 % 250 mL IVPB        500 mg 250 mL/hr over 60 Minutes Intravenous  Once 06/14/20 0349 06/14/20 0732     Subjective: Seen and examined at bedside.  Overnight events noted,  Patient reports feeling improved, he has participated in physical therapy. Patient is a s/p left hip hemiarthroplasty postoperative day 1.   Objective: Vitals:   06/16/20 2330 06/17/20 0128 06/17/20 0536 06/17/20 1347  BP:  (!) 141/76 119/77 113/72  Pulse:  71 82 81  Resp: 18 16 16 16   Temp:   98.4 F (36.9 C) 97.8 F (36.6 C)  TempSrc:   Oral Oral  SpO2:  100% 93% 90%  Weight:      Height:        Intake/Output Summary (Last 24 hours) at 06/17/2020 1532 Last data filed at 06/17/2020 1300 Gross per 24 hour  Intake 2310 ml  Output 1250 ml  Net 1060 ml   Filed Weights   06/14/20 2247  Weight: 91.7 kg    Examination:  General exam: Appears calm and comfortable , Not in any distress. Respiratory system: Clear to auscultation. Respiratory effort normal. Cardiovascular system: S1 & S2 heard, RRR. No JVD, murmurs, rubs, gallops or clicks. No pedal edema. Gastrointestinal system: Abdomen is nondistended, soft and nontender. No organomegaly or masses felt. Normal bowel sounds heard. Central nervous system: Alert and oriented. No focal neurological deficits. Extremities: Left hip tenderness noted.  Able to ambulate with support. Skin: No rashes, lesions or ulcers Psychiatry: Judgement and insight appear normal. Mood & affect appropriate.     Data Reviewed: I have personally reviewed following labs and imaging studies  CBC: Recent Labs  Lab 06/14/20 0133 06/14/20 0727 06/16/20 0328 06/17/20 0333  WBC 16.5* 12.6* 11.3* 13.0*  NEUTROABS 15.0*  --   --   --   HGB 11.4* 10.8* 10.3* 9.6*  HCT 36.1* 33.9* 31.7* 30.3*  MCV 89.4 90.2 90.3 92.4  PLT 358 329 261 250   Basic Metabolic Panel: Recent Labs  Lab 06/14/20 0133 06/14/20 0727  06/16/20 0328 06/17/20 0333  NA 137  --  138 137  K 3.7  --  3.6 4.1  CL 98  --  99 99  CO2 24  --  29 25  GLUCOSE 257*  --  143* 189*  BUN 44*  --  31* 34*  CREATININE 1.94* 1.63* 1.46* 1.41*  CALCIUM 8.7*  --  8.2* 8.1*  MG  --   --  1.7 1.7  PHOS  --   --   --  4.4   GFR: Estimated Creatinine Clearance: 52.6 mL/min (A) (by C-G formula based on SCr of 1.41 mg/dL (H)). Liver Function Tests: No results for  input(s): AST, ALT, ALKPHOS, BILITOT, PROT, ALBUMIN in the last 168 hours. No results for input(s): LIPASE, AMYLASE in the last 168 hours. No results for input(s): AMMONIA in the last 168 hours. Coagulation Profile: Recent Labs  Lab 06/14/20 0133  INR 1.0   Cardiac Enzymes: No results for input(s): CKTOTAL, CKMB, CKMBINDEX, TROPONINI in the last 168 hours. BNP (last 3 results) No results for input(s): PROBNP in the last 8760 hours. HbA1C: No results for input(s): HGBA1C in the last 72 hours. CBG: Recent Labs  Lab 06/16/20 1130 06/16/20 1736 06/16/20 2223 06/17/20 0722 06/17/20 1133  GLUCAP 152* 150* 211* 153* 169*   Lipid Profile: No results for input(s): CHOL, HDL, LDLCALC, TRIG, CHOLHDL, LDLDIRECT in the last 72 hours. Thyroid Function Tests: No results for input(s): TSH, T4TOTAL, FREET4, T3FREE, THYROIDAB in the last 72 hours. Anemia Panel: No results for input(s): VITAMINB12, FOLATE, FERRITIN, TIBC, IRON, RETICCTPCT in the last 72 hours. Sepsis Labs: Recent Labs  Lab 06/14/20 0133  PROCALCITON 0.13    Recent Results (from the past 240 hour(s))  Resp Panel by RT-PCR (Flu A&B, Covid) Nasopharyngeal Swab     Status: None   Collection Time: 06/14/20  2:28 AM   Specimen: Nasopharyngeal Swab; Nasopharyngeal(NP) swabs in vial transport medium  Result Value Ref Range Status   SARS Coronavirus 2 by RT PCR NEGATIVE NEGATIVE Final    Comment: (NOTE) SARS-CoV-2 target nucleic acids are NOT DETECTED.  The SARS-CoV-2 RNA is generally detectable in upper  respiratory specimens during the acute phase of infection. The lowest concentration of SARS-CoV-2 viral copies this assay can detect is 138 copies/mL. A negative result does not preclude SARS-Cov-2 infection and should not be used as the sole basis for treatment or other patient management decisions. A negative result may occur with  improper specimen collection/handling, submission of specimen other than nasopharyngeal swab, presence of viral mutation(s) within the areas targeted by this assay, and inadequate number of viral copies(<138 copies/mL). A negative result must be combined with clinical observations, patient history, and epidemiological information. The expected result is Negative.  Fact Sheet for Patients:  BloggerCourse.com  Fact Sheet for Healthcare Providers:  SeriousBroker.it  This test is no t yet approved or cleared by the [place] FDA and  has been authorized for detection and/or diagnosis of SARS-CoV-2 by FDA under an Emergency Use Authorization (EUA). This EUA will remain  in effect (meaning this test can be used) for the duration of the COVID-19 declaration under Section 564(b)(1) of the Act, 21 U.S.C.section 360bbb-3(b)(1), unless the authorization is terminated  or revoked sooner.       Influenza A by PCR NEGATIVE NEGATIVE Final   Influenza B by PCR NEGATIVE NEGATIVE Final    Comment: (NOTE) The Xpert Xpress SARS-CoV-2/FLU/RSV plus assay is intended as an aid in the diagnosis of influenza from Nasopharyngeal swab specimens and should not be used as a sole basis for treatment. Nasal washings and aspirates are unacceptable for Xpert Xpress SARS-CoV-2/FLU/RSV testing.  Fact Sheet for Patients: BloggerCourse.com  Fact Sheet for Healthcare Providers: SeriousBroker.it  This test is not yet approved or cleared by the [place] FDA and has been  authorized for detection and/or diagnosis of SARS-CoV-2 by FDA under an Emergency Use Authorization (EUA). This EUA will remain in effect (meaning this test can be used) for the duration of the COVID-19 declaration under Section 564(b)(1) of the Act, 21 U.S.C. section 360bbb-3(b)(1), unless the authorization is terminated or revoked.  Performed at [facility]  Lab, [address] [place], [place] [address]   Surgical PCR screen     Status: None   Collection Time: 06/14/20 11:30 PM   Specimen: Nasal Mucosa; Nasal Swab  Result Value Ref Range Status   MRSA, PCR NEGATIVE NEGATIVE Final   Staphylococcus aureus NEGATIVE NEGATIVE Final    Comment: (NOTE) The Xpert SA Assay (FDA approved for NASAL specimens in patients 45 years of age and older), is one component of a comprehensive surveillance program. It is not intended to diagnose infection nor to guide or monitor treatment. Performed at [facility], [address] [place], [place] [address]     Radiology Studies: Pelvis Portable  Result Date: 06/16/2020 CLINICAL DATA:  Post left hip replacement. EXAM: PORTABLE PELVIS 1-2 VIEWS COMPARISON:  Preoperative radiograph 06/14/2020 FINDINGS: Left hip arthroplasty in expected alignment. There is no periprosthetic lucency or fracture. Recent postsurgical change includes air and edema in the joint space and soft tissues. Lateral skin staples in place. Pubic rami are intact. IMPRESSION: Left hip arthroplasty without immediate postoperative complication. Electronically Signed   By: Narda Rutherford M.D.   On: 06/16/2020 18:25   DG C-Arm 1-60 Min-No Report  Result Date: 06/16/2020 Fluoroscopy was utilized by the requesting physician.  No radiographic interpretation.   DG HIP OPERATIVE UNILAT W OR W/O PELVIS LEFT  Result Date: 06/16/2020 CLINICAL DATA:  Left hip arthroplasty EXAM: OPERATIVE LEFT HIP (WITH PELVIS IF PERFORMED) TECHNIQUE: Fluoroscopic spot image(s) were  submitted for interpretation post-operatively. COMPARISON:  Preoperative radiograph 06/14/2020 FINDINGS: Two fluoroscopic spot views of the pelvis and left hip in frontal projection. Left hip arthroplasty in expected alignment. Total fluoroscopy time 17 seconds. IMPRESSION: Intraoperative fluoroscopy during left hip arthroplasty. Electronically Signed   By: Narda Rutherford M.D.   On: 06/16/2020 17:09    Scheduled Meds: . atorvastatin  40 mg Oral QPM  . carvedilol  12.5 mg Oral BID WC  . docusate sodium  100 mg Oral BID  . doxycycline  100 mg Oral Q12H  . enoxaparin (LOVENOX) injection  40 mg Subcutaneous Q24H  . famotidine  20 mg Oral QHS  . fenofibrate  54 mg Oral Daily  . finasteride  5 mg Oral Daily  . fluticasone furoate-vilanterol  1 puff Inhalation Daily  . gabapentin  400 mg Oral BID  . insulin aspart  0-15 Units Subcutaneous TID WC  . insulin aspart  0-5 Units Subcutaneous QHS  . senna-docusate  1 tablet Oral BID  . tamsulosin  0.4 mg Oral Daily  . torsemide  20 mg Oral BID   Continuous Infusions: . cefTRIAXone (ROCEPHIN)  IV 2 g (06/17/20 0642)     LOS: 3 days    Time spent: 25 mins.    Cipriano Bunker, MD [facility]   If 7PM-7AM, please contact night-coverage

## 2020-06-17 NOTE — Progress Notes (Signed)
Physical Therapy Treatment Patient Details Name: [name] MRN: [id number] DOB: [birth year] Today's Date: 06/17/2020    History of Present Illness Pt s/p fall with L hip fx and now s/p THR.  Pt with hx of TIA, DM, [name], and CHF    PT Comments    Pt continues very cooperative and motivated to return home but limited by increased pain this pm despite premed.   Follow Up Recommendations  Home health PT     Equipment Recommendations  3in1 (PT)    Recommendations for Other Services OT consult     Precautions / Restrictions Precautions Precautions: Fall Restrictions Weight Bearing Restrictions: No LLE Weight Bearing: Weight bearing as tolerated    Mobility  Bed Mobility Overal bed mobility: Needs Assistance Bed Mobility: Sit to Supine     Supine to sit: HOB elevated;Min assist Sit to supine: Min assist;Mod assist   General bed mobility comments: cues for sequence and use of R LE to self assist.  Physical assist to manage LEs  Transfers Overall transfer level: Needs assistance Equipment used: Rolling walker (2 wheeled) Transfers: Sit to/from Stand Sit to Stand: Min guard         General transfer comment: Steady assist wtih cues for use of UEs to self assist -  Ambulation/Gait Ambulation/Gait assistance: Min assist Gait Distance (Feet): 27 Feet Assistive device: Rolling walker (2 wheeled) Gait Pattern/deviations: Step-to pattern;Decreased step length - right;Decreased step length - left;Shuffle;Trunk flexed Gait velocity: decr   General Gait Details: cues for posture, position from RW and sequence; distance ltd by c/o increasing pain   Stairs             Wheelchair Mobility    Modified Rankin (Stroke Patients Only)       Balance Overall balance assessment: Needs assistance Sitting-balance support: No upper extremity supported;Feet supported Sitting balance-Leahy Scale: Fair     Standing balance support: Bilateral upper extremity  supported;During functional activity Standing balance-Leahy Scale: Fair Standing balance comment: able to take hands off of walker for clothng management                            Cognition Arousal/Alertness: Awake/alert Behavior During Therapy: WFL for tasks assessed/performed Overall Cognitive Status: Within Functional Limits for tasks assessed                                        Exercises      General Comments        Pertinent Vitals/Pain Pain Assessment: 0-10 Pain Score: 6  Pain Location: L hip Pain Descriptors / Indicators: Aching;Sore Pain Intervention(s): Limited activity within patient's tolerance;Monitored during session;Premedicated before session;Ice applied    Home Living Family/patient expects to be discharged to:: Private residence Living Arrangements: Spouse/significant other;Other relatives Available Help at Discharge: Family;Available 24 hours/day Type of Home: House Home Access: Stairs to enter Entrance Stairs-Rails: None Home Layout: One level Home Equipment: Environmental consultant - 2 wheels      Prior Function Level of Independence: Independent      Comments: was completely IND   PT Goals (current goals can now be found in the care plan section) Acute Rehab PT Goals Patient Stated Goal: HOME and NOT TO SNF PT Goal Formulation: With patient Time For Goal Achievement: 06/24/20 Potential to Achieve Goals: Good Progress towards PT goals: Progressing toward goals    Frequency  7X/week      PT Plan Current plan remains appropriate    Co-evaluation              AM-PAC PT "6 Clicks" Mobility   Outcome Measure  Help needed turning from your back to your side while in a flat bed without using bedrails?: A Lot Help needed moving from lying on your back to sitting on the side of a flat bed without using bedrails?: A Lot Help needed moving to and from a bed to a chair (including a wheelchair)?: A Little Help needed  standing up from a chair using your arms (e.g., wheelchair or bedside chair)?: A Little Help needed to walk in hospital room?: A Little Help needed climbing 3-5 steps with a railing? : A Lot 6 Click Score: 15    End of Session Equipment Utilized During Treatment: Gait belt Activity Tolerance: Patient tolerated treatment well Patient left: in bed;with call bell/phone within reach;with bed alarm set Nurse Communication: Mobility status PT Visit Diagnosis: Difficulty in walking, not elsewhere classified (R26.2)     Time: 2440-1027 PT Time Calculation (min) (ACUTE ONLY): 20 min  Charges:  $Gait Training: 8-22 mins                     Mauro Kaufmann PT Acute Rehabilitation Services Pager [phone] Office [phone]    Tyree Fluharty 06/17/2020, 2:57 PM

## 2020-06-17 NOTE — Evaluation (Signed)
Physical Therapy Evaluation Patient Details Name: [name] MRN: [id number] DOB: 1951/05/31 Today's Date: 06/17/2020   History of Present Illness  Pt s/p fall with L hip fx and now s/p THR.  Pt with hx of TIA, DM, [name], and CHF  Clinical Impression  Pt s/p L THR and presents with decreased L LE strength/ROM and post op pain limiting functional mobility.  Pt should progress to dc home with family assist and would greatly benefit from follow up HHPT to maximize IND and safety.    Follow Up Recommendations Home health PT    Equipment Recommendations  3in1 (PT)    Recommendations for Other Services OT consult     Precautions / Restrictions Precautions Precautions: Fall Restrictions Weight Bearing Restrictions: No LLE Weight Bearing: Weight bearing as tolerated      Mobility  Bed Mobility Overal bed mobility: Needs Assistance Bed Mobility: Supine to Sit     Supine to sit: Min assist;Mod assist     General bed mobility comments: cues for sequence and use of R LE to self assist; Physical assist to manage L LE and to bring trunk to upright    Transfers Overall transfer level: Needs assistance Equipment used: Rolling walker (2 wheeled) Transfers: Sit to/from Stand Sit to Stand: Min assist;Mod assist         General transfer comment: cues for LE management and use of UEs to self assist  Ambulation/Gait Ambulation/Gait assistance: Min assist Gait Distance (Feet): 15 Feet Assistive device: Rolling walker (2 wheeled) Gait Pattern/deviations: Step-to pattern;Decreased step length - right;Decreased step length - left;Shuffle;Trunk flexed Gait velocity: decr   General Gait Details: cues for posture, position from RW and sequence; distance ltd by c/o dizziness  Stairs            Wheelchair Mobility    Modified Rankin (Stroke Patients Only)       Balance Overall balance assessment: Needs assistance Sitting-balance support: No upper extremity supported;Feet  supported Sitting balance-Leahy Scale: Fair     Standing balance support: Bilateral upper extremity supported Standing balance-Leahy Scale: Poor                               Pertinent Vitals/Pain Pain Assessment: 0-10 Pain Score: 4  Pain Location: L hip Pain Descriptors / Indicators: Aching;Sore Pain Intervention(s): Limited activity within patient's tolerance;Monitored during session;Premedicated before session;Ice applied    Home Living Family/patient expects to be discharged to:: Private residence Living Arrangements: Spouse/significant other;Other relatives Available Help at Discharge: Family;Available 24 hours/day Type of Home: House Home Access: Stairs to enter Entrance Stairs-Rails: None Entrance Stairs-Number of Steps: 34 Home Layout: One level Home Equipment: Environmental consultant - 2 wheels      Prior Function Level of Independence: Independent         Comments: was completely IND     Hand Dominance   Dominant Hand: Right    Extremity/Trunk Assessment   Upper Extremity Assessment Upper Extremity Assessment: Overall WFL for tasks assessed    Lower Extremity Assessment Lower Extremity Assessment: Generalized weakness;LLE deficits/detail LLE Deficits / Details: strength at hip 2/5 with AAROM at hip to 80 flex and 15 abd       Communication   Communication: No difficulties  Cognition Arousal/Alertness: Awake/alert Behavior During Therapy: WFL for tasks assessed/performed Overall Cognitive Status: Within Functional Limits for tasks assessed  General Comments      Exercises Total Joint Exercises Ankle Circles/Pumps: AROM;Both;15 reps;Supine Quad Sets: AROM;Both;10 reps;Supine Heel Slides: AAROM;Left;20 reps;Supine Hip ABduction/ADduction: AAROM;Left;15 reps;Supine   Assessment/Plan    PT Assessment Patient needs continued PT services  PT Problem List Decreased strength;Decreased range of  motion;Decreased activity tolerance;Decreased balance;Decreased mobility;Decreased knowledge of use of DME;Pain       PT Treatment Interventions DME instruction;Gait training;Stair training;Functional mobility training;Therapeutic activities;Therapeutic exercise;Balance training;Patient/family education    PT Goals (Current goals can be found in the Care Plan section)  Acute Rehab PT Goals Patient Stated Goal: HOME and NOT TO SNF PT Goal Formulation: With patient Time For Goal Achievement: 06/24/20 Potential to Achieve Goals: Good    Frequency 7X/week   Barriers to discharge        Co-evaluation               AM-PAC PT "6 Clicks" Mobility  Outcome Measure Help needed turning from your back to your side while in a flat bed without using bedrails?: A Lot Help needed moving from lying on your back to sitting on the side of a flat bed without using bedrails?: A Lot Help needed moving to and from a bed to a chair (including a wheelchair)?: A Lot Help needed standing up from a chair using your arms (e.g., wheelchair or bedside chair)?: A Lot Help needed to walk in hospital room?: A Lot Help needed climbing 3-5 steps with a railing? : A Lot 6 Click Score: 12    End of Session Equipment Utilized During Treatment: Gait belt Activity Tolerance: Patient tolerated treatment well Patient left: in chair;with call bell/phone within reach;with chair alarm set Nurse Communication: Mobility status PT Visit Diagnosis: Difficulty in walking, not elsewhere classified (R26.2)    Time: 9892-1194 PT Time Calculation (min) (ACUTE ONLY): 42 min   Charges:   PT Evaluation $PT Eval Low Complexity: 1 Low PT Treatments $Gait Training: 8-22 mins $Therapeutic Exercise: 8-22 mins        Mauro Kaufmann PT Acute Rehabilitation Services Pager [phone] Office [phone]     Sven Pinheiro 06/17/2020, 12:21 PM

## 2020-06-17 NOTE — Progress Notes (Signed)
    Subjective:  Patient reports pain as mild to moderate.  Denies N/V/CP/SOB. Patient has had no bowel movement in 4 days  Objective:   VITALS:   Vitals:   06/16/20 2215 06/16/20 2330 06/17/20 0128 06/17/20 0536  BP: (!) 143/85  (!) 141/76 119/77  Pulse: 78  71 82  Resp: 16 18 16 16   Temp: 98.3 F (36.8 C)   98.4 F (36.9 C)  TempSrc: Oral   Oral  SpO2: 98%  100% 93%  Weight:      Height:        NAD ABD soft Neurovascular intact Sensation intact distally Intact pulses distally Dorsiflexion/Plantar flexion intact Incision: dressing C/D/I   Lab Results  Component Value Date   WBC 13.0 (H) 06/17/2020   HGB 9.6 (L) 06/17/2020   HCT 30.3 (L) 06/17/2020   MCV 92.4 06/17/2020   PLT 250 06/17/2020   BMET    Component Value Date/Time   NA 137 06/17/2020 0333   NA 138 04/03/2019 0952   K 4.1 06/17/2020 0333   CL 99 06/17/2020 0333   CO2 25 06/17/2020 0333   GLUCOSE 189 (H) 06/17/2020 0333   BUN 34 (H) 06/17/2020 0333   BUN 28 (H) 04/03/2019 0952   CREATININE 1.41 (H) 06/17/2020 0333   CALCIUM 8.1 (L) 06/17/2020 0333   GFRNONAA 54 (L) 06/17/2020 0333   GFRAA 49 (L) 02/23/2020 1120     Assessment/Plan: 1 Day Post-Op   Principal Problem:   Fracture of femoral neck, left, closed (HCC) Active Problems:   HTN (hypertension)   Chronic systolic heart failure (HCC)   Diabetes mellitus type 2, controlled, without complications (HCC)   OSA (obstructive sleep apnea)   CAP (community acquired pneumonia)   [name] (chronic kidney disease), stage III (HCC)   Prolonged QT interval   WBAT with walker DVT ppx: Aspirin, SCDs, TEDS PO pain control PT/OT Constipation: Hospitalist ordered bowel regimen Dispo: D/C home once cleared by PT/OT and hospitalist     02/25/2020 06/17/2020, 11:54 AM  [facility] is now [facility] [address], [place], [place] Phone:  [phone] [email or web address] Facebook  [phone]

## 2020-06-17 NOTE — Evaluation (Signed)
Occupational Therapy Evaluation Patient Details Name: [name] MRN: [id number] DOB: 1950-12-06 Today's Date: 06/17/2020    History of Present Illness Pt s/p fall with L hip fx and now s/p THR.  Pt with hx of TIA, DM, [name], and CHF   Clinical Impression   [name] is a 69 year old man s/p THR who presents with complaints of pain and decreased ROM and strength of LLE and decreased balance. On evaluation patient demonstrates impaired ability to independently ambulate and perform baseline ADLs. Patient will benefit from skilled OT services while in hospital to improve deficits and learn compensatory strategies as needed in order to return PLOF.      Follow Up Recommendations  Home health OT    Equipment Recommendations       Recommendations for Other Services       Precautions / Restrictions Precautions Precautions: Fall Restrictions Weight Bearing Restrictions: No LLE Weight Bearing: Weight bearing as tolerated      Mobility Bed Mobility Overal bed mobility: Needs Assistance Bed Mobility: Supine to Sit     Supine to sit: HOB elevated;Min assist     General bed mobility comments: Min assist to transfer to side of bed, needed increased time, use of bed rail and min assist for LLE    Transfers Overall transfer level: Needs assistance Equipment used: Rolling walker (2 wheeled) Transfers: Sit to/from Stand Sit to Stand: Min guard         General transfer comment: min guard to ambulate with RW    Balance Overall balance assessment: Needs assistance Sitting-balance support: No upper extremity supported;Feet supported Sitting balance-Leahy Scale: Fair     Standing balance support: Bilateral upper extremity supported;During functional activity Standing balance-Leahy Scale: Fair Standing balance comment: able to take hands off of walker for clothng management                           ADL either performed or assessed with clinical judgement    ADL Overall ADL's : Needs assistance/impaired Eating/Feeding: Independent   Grooming: Min guard;Standing;Wash/dry hands   Upper Body Bathing: Set up   Lower Body Bathing: Moderate assistance;Sit to/from stand   Upper Body Dressing : Set up;Sitting   Lower Body Dressing: Maximal assistance;Sit to/from stand Lower Body Dressing Details (indicate cue type and reason): able to manage clothing knees Toilet Transfer: Min Chief of Staff Details (indicate cue type and reason): transferred to BSc placed over toilet with min guard - verbal cues for technique. Toileting- Architect and Hygiene: Min guard;Sit to/from stand               Vision   Vision Assessment?: No apparent visual deficits     Perception     Praxis      Pertinent Vitals/Pain Pain Assessment: No/denies pain     Hand Dominance Right   Extremity/Trunk Assessment Upper Extremity Assessment Upper Extremity Assessment: Overall WFL for tasks assessed   Lower Extremity Assessment Lower Extremity Assessment: Defer to PT evaluation       Communication Communication Communication: No difficulties   Cognition Arousal/Alertness: Awake/alert Behavior During Therapy: WFL for tasks assessed/performed Overall Cognitive Status: Within Functional Limits for tasks assessed                                     General Comments       Exercises  Shoulder Instructions      Home Living Family/patient expects to be discharged to:: Private residence Living Arrangements: Spouse/significant other;Other relatives Available Help at Discharge: Family;Available 24 hours/day Type of Home: House Home Access: Stairs to enter [employer] of Steps: 34 Entrance Stairs-Rails: None Home Layout: One level     Bathroom Shower/Tub: Chief Strategy Officer: Standard     Home Equipment: Environmental consultant - 2 wheels          Prior  Functioning/Environment Level of Independence: Independent        Comments: was completely IND        OT Problem List: Impaired balance (sitting and/or standing);Decreased knowledge of use of DME or AE;Decreased activity tolerance;Pain      OT Treatment/Interventions: Self-care/ADL training;Therapeutic exercise;DME and/or AE instruction;Therapeutic activities;Balance training;Patient/family education    OT Goals(Current goals can be found in the care plan section) Acute Rehab OT Goals Patient Stated Goal: HOME and NOT TO SNF OT Goal Formulation: With patient Time For Goal Achievement: 07/01/20 Potential to Achieve Goals: Good  OT Frequency: Min 2X/week   Barriers to D/C:            Co-evaluation              AM-PAC OT "6 Clicks" Daily Activity     Outcome Measure Help from another person eating meals?: None Help from another person taking care of personal grooming?: A Little Help from another person toileting, which includes using toliet, bedpan, or urinal?: A Little Help from another person bathing (including washing, rinsing, drying)?: A Lot Help from another person to put on and taking off regular upper body clothing?: A Little Help from another person to put on and taking off regular lower body clothing?: A Lot 6 Click Score: 17   End of Session Equipment Utilized During Treatment: Gait belt;Rolling walker Nurse Communication: Mobility status  Activity Tolerance: Patient tolerated treatment well Patient left: in chair;with call bell/phone within reach;with chair alarm set;with family/visitor present  OT Visit Diagnosis: Unsteadiness on feet (R26.81);Other abnormalities of gait and mobility (R26.89);Pain Pain - Right/Left: Left Pain - part of body: Hip                Time: 1135-1208 OT Time Calculation (min): 33 min Charges:  OT General Charges $OT Visit: 1 Visit OT Evaluation $OT Eval Moderate Complexity: 1 Mod OT Treatments $Self Care/Home Management :  8-22 mins  Ura Yingling, OTR/L Acute Care Rehab Services  Office [phone] Pager: [phone]   Kelli Churn 06/17/2020, 1:11 PM

## 2020-06-18 LAB — CBC
HCT: 25.7 % — ABNORMAL LOW (ref 39.0–52.0)
Hemoglobin: 8.2 g/dL — ABNORMAL LOW (ref 13.0–17.0)
MCH: 29.4 pg (ref 26.0–34.0)
MCHC: 31.9 g/dL (ref 30.0–36.0)
MCV: 92.1 fL (ref 80.0–100.0)
Platelets: 231 10*3/uL (ref 150–400)
RBC: 2.79 MIL/uL — ABNORMAL LOW (ref 4.22–5.81)
RDW: 14.1 % (ref 11.5–15.5)
WBC: 10.5 10*3/uL (ref 4.0–10.5)
nRBC: 0 % (ref 0.0–0.2)

## 2020-06-18 LAB — BASIC METABOLIC PANEL
Anion gap: 11 (ref 5–15)
BUN: 47 mg/dL — ABNORMAL HIGH (ref 8–23)
CO2: 27 mmol/L (ref 22–32)
Calcium: 7.8 mg/dL — ABNORMAL LOW (ref 8.9–10.3)
Chloride: 100 mmol/L (ref 98–111)
Creatinine, Ser: 1.49 mg/dL — ABNORMAL HIGH (ref 0.61–1.24)
GFR, Estimated: 50 mL/min — ABNORMAL LOW (ref >60)
Glucose, Bld: 167 mg/dL — ABNORMAL HIGH (ref 70–99)
Potassium: 3.8 mmol/L (ref 3.5–5.1)
Sodium: 138 mmol/L (ref 135–145)

## 2020-06-18 LAB — GLUCOSE, CAPILLARY
Glucose-Capillary: 140 mg/dL — ABNORMAL HIGH (ref 70–99)
Glucose-Capillary: 200 mg/dL — ABNORMAL HIGH (ref 70–99)
Glucose-Capillary: 196 mg/dL — ABNORMAL HIGH (ref 70–99)
Glucose-Capillary: 189 mg/dL — ABNORMAL HIGH (ref 70–99)

## 2020-06-18 LAB — PHOSPHORUS: Phosphorus: 3.6 mg/dL (ref 2.5–4.6)

## 2020-06-18 LAB — MAGNESIUM: Magnesium: 1.7 mg/dL (ref 1.7–2.4)

## 2020-06-18 LAB — HEMOGLOBIN AND HEMATOCRIT, BLOOD
HCT: 28.7 % — ABNORMAL LOW (ref 39.0–52.0)
Hemoglobin: 9.1 g/dL — ABNORMAL LOW (ref 13.0–17.0)

## 2020-06-18 NOTE — Plan of Care (Signed)
  Problem: Education: Goal: Knowledge of General Education information will improve Description: Including pain rating scale, medication(s)/side effects and non-pharmacologic comfort measures Outcome: Progressing   Problem: Activity: Goal: Risk for activity intolerance will decrease Outcome: Progressing   Problem: Clinical Measurements: Goal: Will remain free from infection Outcome: Progressing   

## 2020-06-18 NOTE — Progress Notes (Signed)
[name]  MRN: [id number] DOB/Age: 1950/10/12 69 y.o. [facility] Procedure: Procedure(s) (LRB): TOTAL HIP ARTHROPLASTY ANTERIOR APPROACH (Left)     Subjective: Up in chair, no specific complaints.   Vital Signs Temp:  [97.8 F (36.6 C)-98.9 F (37.2 C)] 98.9 F (37.2 C) (12/18 0554) Pulse Rate:  [81-102] 102 (12/18 0554) Resp:  [16] 16 (12/18 0554) BP: (113-140)/(67-87) 140/87 (12/18 0554) SpO2:  [84 %-98 %] 92 % (12/18 0844)  Lab Results Recent Labs    06/17/20 0333 06/18/20 0256 06/18/20 0753  WBC 13.0* 10.5  --   HGB 9.6* 8.2* 9.1*  HCT 30.3* 25.7* 28.7*  PLT 250 231  --    BMET Recent Labs    06/17/20 0333 06/18/20 0256  NA 137 138  K 4.1 3.8  CL 99 100  CO2 25 27  GLUCOSE 189* 167*  BUN 34* 47*  CREATININE 1.41* 1.49*  CALCIUM 8.1* 7.8*   INR  Date Value Ref Range Status  06/14/2020 1.0 0.8 - 1.2 Final    Comment:    (NOTE) INR goal varies based on device and disease states. Performed at [facility], [address] [place], [place] [address]      Exam Sitting up in chair Moving extremities well        Plan Continue current therapies Mobilize with PT  Ralene Bathe PA-C  06/18/2020, 9:26 AM Contact # [phone]

## 2020-06-18 NOTE — Plan of Care (Signed)
Plan of care reviewed and discussed with the patient. 

## 2020-06-18 NOTE — Progress Notes (Signed)
PROGRESS NOTE    [name]  URK:[id number] DOB: 08/17/1950 DOA: 06/14/2020 PCP: [facility]    Brief Narrative:  This 69 years old male with history of DM 2, hypertension, CHF, CAD, [name] stage IIIa admitted status post mechanical fall with left hip fracture. Patient was also recently diagnosed with community-acquired pneumonia and was prescribed doxycycline.  Orthopedics consulted ,   He underwent  left total hip arthroplasty 12/16,  tolerated well,  Ortho recommended weightbearing as tolerated.  PT recommended SNF but patient prefers home with home services.    Assessment & Plan:   Principal Problem:   Fracture of femoral neck, left, closed (HCC) Active Problems:   HTN (hypertension)   Chronic systolic heart failure (HCC)   Diabetes mellitus type 2, controlled, without complications (HCC)   OSA (obstructive sleep apnea)   CAP (community acquired pneumonia)   [name] (chronic kidney disease), stage III (HCC)   Prolonged QT interval  Fracture of femoral neck left closed: Patient is s/p mechanical fall with left femoral neck fracture. Orthopedic consulted , underwent left total hip arthroplasty 12/16.  Postoperative day 2. Adequate pain control with oxycodone as needed. PT/ OT: Recommended skilled nursing facility but patient prefers home with home PT. Ortho advised weight bearing as tolerated, pain control. Home health services needs to be arranged.   Community-acquired pneumonia: Infiltrate seen on chest x-ray with elevated WBC.  Elevated WBC could also be reactive.   Currently on doxycycline.  Incentive spirometer, flutter valve when out of bed to chair.  Bronchodilators as needed. Procalcitonin 0.13. Finish antibiotics course.  Prolonged QTC- Avoid QT prolongation medications.   Recheck EKG QTC normal.  Hypertension:  Continue Home blood pressure medications.  Diabetes mellitus type 2- insulin sliding scale and Accu-Chek, Metformin on  hold.  Congestive heart failure with reduced EF: EF 45%, grade 1 DD-appears euvolemic.   Closely monitor.  Continue home medications  Peripheral neuropathy-Continue gabapentin  [name] stage IIIa:  renal functions are stable.  Obstructive sleep apnea: Continue CPAP at night.   DVT prophylaxis: Lovenox Code Status: Full code Family Communication: No family at bedside Disposition Plan: Status is: Inpatient  Remains inpatient appropriate because:Inpatient level of care appropriate due to severity of illness   Dispo: The patient is from: Home              Anticipated d/c is GB:TDVV with home PT              Anticipated d/c date is: 12/19              Patient currently is medically stable.   Consultants:   Orthopedics  Procedures:  Antimicrobials:  Anti-infectives (From admission, onward)   Start     Dose/Rate Route Frequency Ordered Stop   06/16/20 2015  ceFAZolin (ANCEF) IVPB 2g/100 mL premix  Status:  Discontinued        2 g 200 mL/hr over 30 Minutes Intravenous Every 6 hours 06/16/20 1922 06/16/20 2047   06/16/20 1245  ceFAZolin (ANCEF) IVPB 2g/100 mL premix  Status:  Discontinued        2 g 200 mL/hr over 30 Minutes Intravenous On call to O.R. 06/16/20 1236 06/16/20 1857   06/15/20 1400  ceFAZolin (ANCEF) IVPB 2g/100 mL premix  Status:  Discontinued        2 g 200 mL/hr over 30 Minutes Intravenous On call to O.R. 06/14/20 2254 06/16/20 0559   06/15/20 0600  cefTRIAXone (ROCEPHIN) 2 g in sodium chloride  0.9 % 100 mL IVPB        2 g 200 mL/hr over 30 Minutes Intravenous Every 24 hours 06/14/20 0620     06/14/20 1000  doxycycline (VIBRA-TABS) tablet 100 mg        100 mg Oral Every 12 hours 06/14/20 0614     06/14/20 0615  cefTRIAXone (ROCEPHIN) 1 g in sodium chloride 0.9 % 100 mL IVPB  Status:  Discontinued        1 g 200 mL/hr over 30 Minutes Intravenous Every 24 hours 06/14/20 0614 06/14/20 0619   06/14/20 0400  cefTRIAXone (ROCEPHIN) 1 g in sodium chloride 0.9 %  100 mL IVPB        1 g 200 mL/hr over 30 Minutes Intravenous  Once 06/14/20 0349 06/14/20 0613   06/14/20 0400  azithromycin (ZITHROMAX) 500 mg in sodium chloride 0.9 % 250 mL IVPB        500 mg 250 mL/hr over 60 Minutes Intravenous  Once 06/14/20 0349 06/14/20 0732     Subjective: Patient was seen and examined at bedside.  Overnight events noted,  Patient reports feeling improved, he has participated in physical therapy, screaming in pain. Patient is a s/p left hip hemiarthroplasty postoperative day 2.   Objective: Vitals:   06/18/20 0554 06/18/20 0843 06/18/20 0844 06/18/20 1428  BP: 140/87   110/72  Pulse: (!) 102   84  Resp: 16   18  Temp: 98.9 F (37.2 C)   98.4 F (36.9 C)  TempSrc: Oral   Oral  SpO2: (!) 84% 92% 92% 100%  Weight:      Height:        Intake/Output Summary (Last 24 hours) at 06/18/2020 1510 Last data filed at 06/18/2020 1429 Gross per 24 hour  Intake 840 ml  Output 500 ml  Net 340 ml   Filed Weights   06/14/20 2247  Weight: 91.7 kg    Examination:  General exam: Appears calm and comfortable , Not in any distress. Respiratory system: Clear to auscultation. Respiratory effort normal. Cardiovascular system: S1 & S2 heard, RRR. No JVD, murmurs, rubs, gallops or clicks. No pedal edema. Gastrointestinal system: Abdomen is nondistended, soft and nontender. No organomegaly or masses felt. Normal bowel sounds heard. Central nervous system: Alert and oriented. No focal neurological deficits. Extremities: Left hip tenderness noted.  Able to ambulate with support. Skin: No rashes, lesions or ulcers Psychiatry: Judgement and insight appear normal. Mood & affect appropriate.     Data Reviewed: I have personally reviewed following labs and imaging studies  CBC: Recent Labs  Lab 06/14/20 0133 06/14/20 0727 06/16/20 0328 06/17/20 0333 06/18/20 0256 06/18/20 0753  WBC 16.5* 12.6* 11.3* 13.0* 10.5  --   NEUTROABS 15.0*  --   --   --   --   --    HGB 11.4* 10.8* 10.3* 9.6* 8.2* 9.1*  HCT 36.1* 33.9* 31.7* 30.3* 25.7* 28.7*  MCV 89.4 90.2 90.3 92.4 92.1  --   PLT 358 329 261 250 231  --    Basic Metabolic Panel: Recent Labs  Lab 06/14/20 0133 06/14/20 0727 06/16/20 0328 06/17/20 0333 06/18/20 0256  NA 137  --  138 137 138  K 3.7  --  3.6 4.1 3.8  CL 98  --  99 99 100  CO2 24  --  29 25 27   GLUCOSE 257*  --  143* 189* 167*  BUN 44*  --  31* 34* 47*  CREATININE 1.94* 1.63* 1.46*  1.41* 1.49*  CALCIUM 8.7*  --  8.2* 8.1* 7.8*  MG  --   --  1.7 1.7 1.7  PHOS  --   --   --  4.4 3.6   GFR: Estimated Creatinine Clearance: 49.8 mL/min (A) (by C-G formula based on SCr of 1.49 mg/dL (H)). Liver Function Tests: No results for input(s): AST, ALT, ALKPHOS, BILITOT, PROT, ALBUMIN in the last 168 hours. No results for input(s): LIPASE, AMYLASE in the last 168 hours. No results for input(s): AMMONIA in the last 168 hours. Coagulation Profile: Recent Labs  Lab 06/14/20 0133  INR 1.0   Cardiac Enzymes: No results for input(s): CKTOTAL, CKMB, CKMBINDEX, TROPONINI in the last 168 hours. BNP (last 3 results) No results for input(s): PROBNP in the last 8760 hours. HbA1C: No results for input(s): HGBA1C in the last 72 hours. CBG: Recent Labs  Lab 06/17/20 1133 06/17/20 1639 06/17/20 2229 06/18/20 0801 06/18/20 1157  GLUCAP 169* 186* 178* 140* 200*   Lipid Profile: No results for input(s): CHOL, HDL, LDLCALC, TRIG, CHOLHDL, LDLDIRECT in the last 72 hours. Thyroid Function Tests: No results for input(s): TSH, T4TOTAL, FREET4, T3FREE, THYROIDAB in the last 72 hours. Anemia Panel: No results for input(s): VITAMINB12, FOLATE, FERRITIN, TIBC, IRON, RETICCTPCT in the last 72 hours. Sepsis Labs: Recent Labs  Lab 06/14/20 0133  PROCALCITON 0.13    Recent Results (from the past 240 hour(s))  Resp Panel by RT-PCR (Flu A&B, Covid) Nasopharyngeal Swab     Status: None   Collection Time: 06/14/20  2:28 AM   Specimen:  Nasopharyngeal Swab; Nasopharyngeal(NP) swabs in vial transport medium  Result Value Ref Range Status   SARS Coronavirus 2 by RT PCR NEGATIVE NEGATIVE Final    Comment: (NOTE) SARS-CoV-2 target nucleic acids are NOT DETECTED.  The SARS-CoV-2 RNA is generally detectable in upper respiratory specimens during the acute phase of infection. The lowest concentration of SARS-CoV-2 viral copies this assay can detect is 138 copies/mL. A negative result does not preclude SARS-Cov-2 infection and should not be used as the sole basis for treatment or other patient management decisions. A negative result may occur with  improper specimen collection/handling, submission of specimen other than nasopharyngeal swab, presence of viral mutation(s) within the areas targeted by this assay, and inadequate number of viral copies(<138 copies/mL). A negative result must be combined with clinical observations, patient history, and epidemiological information. The expected result is Negative.  Fact Sheet for Patients:  BloggerCourse.com  Fact Sheet for Healthcare Providers:  SeriousBroker.it  This test is no t yet approved or cleared by the [place] FDA and  has been authorized for detection and/or diagnosis of SARS-CoV-2 by FDA under an Emergency Use Authorization (EUA). This EUA will remain  in effect (meaning this test can be used) for the duration of the COVID-19 declaration under Section 564(b)(1) of the Act, 21 U.S.C.section 360bbb-3(b)(1), unless the authorization is terminated  or revoked sooner.       Influenza A by PCR NEGATIVE NEGATIVE Final   Influenza B by PCR NEGATIVE NEGATIVE Final    Comment: (NOTE) The Xpert Xpress SARS-CoV-2/FLU/RSV plus assay is intended as an aid in the diagnosis of influenza from Nasopharyngeal swab specimens and should not be used as a sole basis for treatment. Nasal washings and aspirates are unacceptable for  Xpert Xpress SARS-CoV-2/FLU/RSV testing.  Fact Sheet for Patients: BloggerCourse.com  Fact Sheet for Healthcare Providers: SeriousBroker.it  This test is not yet approved or cleared by the [place] FDA  and has been authorized for detection and/or diagnosis of SARS-CoV-2 by FDA under an Emergency Use Authorization (EUA). This EUA will remain in effect (meaning this test can be used) for the duration of the COVID-19 declaration under Section 564(b)(1) of the Act, 21 U.S.C. section 360bbb-3(b)(1), unless the authorization is terminated or revoked.  Performed at [facility], [address] [place], [place] [address]   Surgical PCR screen     Status: None   Collection Time: 06/14/20 11:30 PM   Specimen: Nasal Mucosa; Nasal Swab  Result Value Ref Range Status   MRSA, PCR NEGATIVE NEGATIVE Final   Staphylococcus aureus NEGATIVE NEGATIVE Final    Comment: (NOTE) The Xpert SA Assay (FDA approved for NASAL specimens in patients 5 years of age and older), is one component of a comprehensive surveillance program. It is not intended to diagnose infection nor to guide or monitor treatment. Performed at [facility], [address] [place], [place] [address]     Radiology Studies: Pelvis Portable  Result Date: 06/16/2020 CLINICAL DATA:  Post left hip replacement. EXAM: PORTABLE PELVIS 1-2 VIEWS COMPARISON:  Preoperative radiograph 06/14/2020 FINDINGS: Left hip arthroplasty in expected alignment. There is no periprosthetic lucency or fracture. Recent postsurgical change includes air and edema in the joint space and soft tissues. Lateral skin staples in place. Pubic rami are intact. IMPRESSION: Left hip arthroplasty without immediate postoperative complication. Electronically Signed   By: Narda Rutherford M.D.   On: 06/16/2020 18:25   DG C-Arm 1-60 Min-No Report  Result Date: 06/16/2020 Fluoroscopy was  utilized by the requesting physician.  No radiographic interpretation.   DG HIP OPERATIVE UNILAT W OR W/O PELVIS LEFT  Result Date: 06/16/2020 CLINICAL DATA:  Left hip arthroplasty EXAM: OPERATIVE LEFT HIP (WITH PELVIS IF PERFORMED) TECHNIQUE: Fluoroscopic spot image(s) were submitted for interpretation post-operatively. COMPARISON:  Preoperative radiograph 06/14/2020 FINDINGS: Two fluoroscopic spot views of the pelvis and left hip in frontal projection. Left hip arthroplasty in expected alignment. Total fluoroscopy time 17 seconds. IMPRESSION: Intraoperative fluoroscopy during left hip arthroplasty. Electronically Signed   By: Narda Rutherford M.D.   On: 06/16/2020 17:09    Scheduled Meds: . atorvastatin  40 mg Oral QPM  . carvedilol  12.5 mg Oral BID WC  . docusate sodium  100 mg Oral BID  . doxycycline  100 mg Oral Q12H  . enoxaparin (LOVENOX) injection  40 mg Subcutaneous Q24H  . famotidine  20 mg Oral QHS  . fenofibrate  54 mg Oral Daily  . finasteride  5 mg Oral Daily  . fluticasone furoate-vilanterol  1 puff Inhalation Daily  . gabapentin  400 mg Oral BID  . insulin aspart  0-15 Units Subcutaneous TID WC  . insulin aspart  0-5 Units Subcutaneous QHS  . senna-docusate  1 tablet Oral BID  . tamsulosin  0.4 mg Oral Daily  . torsemide  20 mg Oral BID   Continuous Infusions: . cefTRIAXone (ROCEPHIN)  IV Stopped (06/18/20 0651)     LOS: 4 days    Time spent: 25 mins.    Cipriano Bunker, MD [facility]   If 7PM-7AM, please contact night-coverage

## 2020-06-18 NOTE — Progress Notes (Signed)
Occupational Therapy Treatment Patient Details Name: [name] MRN: [id number] DOB: Feb 13, 1951 Today's Date: 06/18/2020    History of present illness Pt s/p fall with L hip fx and now s/p THR.  Pt with hx of TIA, DM, [name], and CHF   OT comments  Treatment focused on teaching patient how to use reacher to assist with lower body dressing as well as improving activity tolerance. Patient did well with reacher but limited by pain in regards to mobility. Cont POC.  Follow Up Recommendations  Home health OT    Equipment Recommendations  None recommended by OT    Recommendations for Other Services      Precautions / Restrictions Precautions Precautions: Fall Restrictions Weight Bearing Restrictions: No LLE Weight Bearing: Weight bearing as tolerated       Mobility Bed Mobility Overal bed mobility: Needs Assistance Bed Mobility: Supine to Sit     Supine to sit: Min assist Sit to supine: Min assist;Mod assist   General bed mobility comments: Increased time with use of bed rail and assist to manage LLE to side of bed.  Transfers Overall transfer level: Needs assistance Equipment used: Rolling walker (2 wheeled) Transfers: Sit to/from [employer] Sit to Stand: Min guard;From elevated surface Stand pivot transfers: Min guard       General transfer comment: cues for LE management and use of UEs to self assist.  Physical assist to bring wt up and fwd and to control descent    Balance Overall balance assessment: Mild deficits observed, not formally tested Sitting-balance support: No upper extremity supported;Feet supported Sitting balance-Leahy Scale: Good     Standing balance support: Bilateral upper extremity supported;During functional activity Standing balance-Leahy Scale: Fair                             ADL either performed or assessed with clinical judgement   ADL                       Lower Body Dressing: Set up;Min  guard;Sit to/from stand Lower Body Dressing Details (indicate cue type and reason): Demonstrated ability to don pants with min guard assist. Patient provided with LHR to assist with lower body dressing and grasping items off floor. Patient demonstrated use by picking up items x 4 off the floor. Patient able to don pants without reacher due to increased fabric. Verbalized understanding of how to use.     Toileting- Architect and Hygiene: Min guard;Sit to/from stand Toileting - Clothing Manipulation Details (indicate cue type and reason): used urinal standing at side of bed with min guard and use of rw to stabilize.             Vision Patient Visual Report: No change from baseline Vision Assessment?: No apparent visual deficits   Perception     Praxis      Cognition Arousal/Alertness: Awake/alert Behavior During Therapy: WFL for tasks assessed/performed Overall Cognitive Status: Within Functional Limits for tasks assessed                                          Exercises     Shoulder Instructions       General Comments      Pertinent Vitals/ Pain       Pain Assessment: 0-10 Pain Score: 6  Pain  Location: L hip Pain Descriptors / Indicators: Aching;Sore Pain Intervention(s): Monitored during session;Patient requesting pain meds-RN notified;Limited activity within patient's tolerance  Home Living                                          Prior Functioning/Environment              Frequency  Min 2X/week        Progress Toward Goals  OT Goals(current goals can now be found in the care plan section)  Progress towards OT goals: Progressing toward goals  Acute Rehab OT Goals Patient Stated Goal: HOME and NOT TO SNF OT Goal Formulation: With patient Time For Goal Achievement: 07/01/20 Potential to Achieve Goals: Good  Plan Discharge plan remains appropriate    Co-evaluation                 AM-PAC OT "6  Clicks" Daily Activity     Outcome Measure   Help from another person eating meals?: None Help from another person taking care of personal grooming?: A Little Help from another person toileting, which includes using toliet, bedpan, or urinal?: A Little Help from another person bathing (including washing, rinsing, drying)?: A Lot Help from another person to put on and taking off regular upper body clothing?: A Little Help from another person to put on and taking off regular lower body clothing?: A Lot (set up for just pants, has assistance for shoes and socks from spouse at baseline.) 6 Click Score: 17    End of Session Equipment Utilized During Treatment: Rolling walker  OT Visit Diagnosis: Unsteadiness on feet (R26.81);Other abnormalities of gait and mobility (R26.89);Pain Pain - Right/Left: Right Pain - part of body: Hip   Activity Tolerance Patient limited by pain   Patient Left in chair;with call bell/phone within reach;with chair alarm set;with family/visitor present   Nurse Communication Mobility status;Patient requests pain meds        Time: 1355-1412 OT Time Calculation (min): 17 min  Charges: OT General Charges $OT Visit: 1 Visit OT Treatments $Self Care/Home Management : 8-22 mins  Waldron Session, OTR/L Acute Care Rehab Services  Office [phone] Pager: [phone]    Kelli Churn 06/18/2020, 2:33 PM

## 2020-06-18 NOTE — TOC Initial Note (Addendum)
Transition of Care [facility]) - Initial/Assessment Note    Patient Details  Name: [name] MRN: [id number] Date of Birth: [birth year]  Transition of Care (TOC) CM/SW Contact:    Armanda Heritage, RN Phone Number: 06/18/2020, 2:02 PM  Clinical Narrative:                 CM spoke with patient who reports he has rolling walker at home, [employer] to deliver 3in1.  [facility] to provide HHPT. MD please place [facility] order prior to discharge.  Expected Discharge Plan: Home w Home Health Services Barriers to Discharge: Continued Medical Work up   Patient Goals and CMS Choice Patient states their goals for this hospitalization and ongoing recovery are:: to go home with therapy CMS Medicare.gov Compare Post Acute Care list provided to:: Patient Choice offered to / list presented to : Patient  Expected Discharge Plan and Services Expected Discharge Plan: Home w Home Health Services   Discharge Planning Services: CM Consult Post Acute Care Choice: Home Health Living arrangements for the past 2 months: Single Family Home                 DME Arranged: 3-N-1 DME Agency: [employer] Date DME Agency Contacted: 06/18/20 Time DME Agency Contacted: 1401 Representative spoke with at DME Agency: [facility] HH Arranged: PT HH Agency: [facility] Date [facility] Agency Contacted: 06/18/20 Time HH Agency Contacted: 1402 Representative spoke with at [facility] Agency: Kandee Keen  Prior Living Arrangements/Services Living arrangements for the past 2 months: Single Family Home   Patient language and need for interpreter reviewed:: Yes Do you feel safe going back to the place where you live?: Yes      Need for Family Participation in Patient Care: Yes (Comment) Care giver support system in place?: Yes (comment)   Criminal Activity/Legal Involvement Pertinent to Current Situation/Hospitalization: No - Comment as needed  Activities of Daily Living Home Assistive Devices/Equipment: Grab bars around toilet,Grab bars  in shower,Shower chair with back ADL Screening (condition at time of admission) Patient's cognitive ability adequate to safely complete daily activities?: Yes Is the patient deaf or have difficulty hearing?: Yes Does the patient have difficulty seeing, even when wearing glasses/contacts?: Yes Does the patient have difficulty concentrating, remembering, or making decisions?: No Patient able to express need for assistance with ADLs?: Yes Does the patient have difficulty dressing or bathing?: Yes Independently performs ADLs?: No Communication: Independent Dressing (OT): Needs assistance Is this a change from baseline?: Change from baseline, expected to last <3days Grooming: Needs assistance Is this a change from baseline?: Change from baseline, expected to last <3 days Feeding: Independent Bathing: Needs assistance Is this a change from baseline?: Change from baseline, expected to last <3 days Toileting: Needs assistance Is this a change from baseline?: Change from baseline, expected to last <3 days In/Out Bed: Needs assistance Is this a change from baseline?: Change from baseline, expected to last <3 days Walks in Home: Independent with device (comment) (has a walker) Does the patient have difficulty walking or climbing stairs?: Yes Weakness of Legs: Left Weakness of Arms/Hands: None  Permission Sought/Granted                  Emotional Assessment Appearance:: Appears stated age Attitude/Demeanor/Rapport: Engaged Affect (typically observed): Accepting Orientation: : Oriented to Self,Oriented to Place,Oriented to  Time,Oriented to Situation   Psych Involvement: No (comment)  Admission diagnosis:  Hyperglycemia [R73.9] AKI (acute kidney injury) (HCC) [N17.9] Fracture of femoral neck, left, closed (HCC) [S72.002A]  Left displaced femoral neck fracture (HCC) [S72.002A] Fall, initial encounter [W19.XXXA] Patient Active Problem List   Diagnosis Date Noted  . Fracture of  femoral neck, left, closed (HCC) 06/14/2020  . CAP (community acquired pneumonia) 06/14/2020  . [name] (chronic kidney disease), stage III (HCC) 06/14/2020  . Prolonged QT interval 06/14/2020  . Age-related nuclear cataract, left 05/10/2020  . Exudative age-related macular degeneration of left eye with active choroidal neovascularization (HCC) 04/05/2020  . Exudative age-related macular degeneration of right eye with inactive choroidal neovascularization (HCC) 04/05/2020  . Diabetes mellitus without complication (HCC) 04/05/2020  . Hallux limitus of left foot 06/05/2019  . Hallux limitus of right foot 06/05/2019  . Seasonal allergies 02/12/2019  . Health care maintenance 02/12/2019  . GERD (gastroesophageal reflux disease) 02/12/2019  . Nocturnal hypoxemia 02/12/2019  . OSA (obstructive sleep apnea) 02/12/2019  . Pain due to onychomycosis of toenails of both feet 01/30/2019  . Lobar pneumonia (HCC) 12/29/2018  . Diabetes mellitus type 2, controlled, without complications (HCC) 12/29/2018  . Thoracic aortic aneurysm (HCC) 12/29/2018  . Acute on chronic combined systolic and diastolic CHF (congestive heart failure) (HCC) 12/25/2018  . Syncope 12/25/2018  . Chronic systolic heart failure (HCC) 11/19/2018  . Chronic venous insufficiency 06/24/2018  . Respiratory failure (HCC) 06/01/2018  . Leg swelling 05/06/2018  . Acute respiratory failure with hypoxemia (HCC) 10/14/2017  . Influenza-like illness 10/14/2017  . Delirium 10/14/2017  . Acute urinary retention 10/14/2017  . (HFpEF) heart failure with preserved ejection fraction (HCC), pulmonary edema and LBBB of unknown chronicity  10/29/2016  . Elevated troponin I level, presume demand ischemia  10/29/2016  . AKI (acute kidney injury) (HCC) 10/29/2016  . BPH (benign prostatic hyperplasia) 10/29/2016  . Sepsis (HCC) 10/29/2016  . Bacteremia due to Escherichia coli 10/29/2016  . Fatty liver 10/29/2016  . Acute pulmonary edema (HCC)   .  Acute hypoxic and hypercarbic respiratory failure (HCC) in setting of bilateral pulmonary infiltrates. Presume CAP + pulmonary edema +/-ALI 10/27/2016  . Left-sided weakness 05/04/2015  . TIA (transient ischemic attack) : Rule out. 05/04/2015  . Hyperlipidemia 05/04/2015  . Chest pain 10/10/2013  . SOB (shortness of breath) 10/10/2013  . Hyperglycemia without ketosis 10/17/2011  . HTN (hypertension) 10/17/2011  . Foot drop, left 10/17/2011  . Impacted cerumen of right ear 10/17/2011  . Dyspnea 10/17/2011  . History of TIA (transient ischemic attack) 10/17/2011  . Neuropathy (HCC) 10/17/2011  . Aortic insufficiency 03/16/2011  . Chest pain 03/16/2011   PCP:  Care, Jovita Kussmaul [facility]:   [facility] [address] [place] [address] Phone: [phone] Fax: [phone]  [facility] [address] [place] - [address] [place] [address] Phone: [phone] Fax: [phone]     Social Determinants of Health (SDOH) Interventions    Readmission Risk Interventions Readmission Risk Prevention Plan 10/17/2018  Transportation Screening Complete  PCP or Specialist Appt within 3-5 Days Complete  HRI or Home Care Consult Complete  Social Work Consult for Recovery Care Planning/Counseling Complete  Palliative Care Screening Not Applicable  Medication Review Oceanographer) Complete  Some recent data might be hidden

## 2020-06-18 NOTE — Progress Notes (Signed)
Physical Therapy Treatment Patient Details Name: [name] MRN: [id number] DOB: [birth year] Today's Date: 06/18/2020    History of Present Illness Pt s/p fall with L hip fx and now s/p THR.  Pt with hx of TIA, DM, [name], and CHF    PT Comments    Pt continues cooperative but progressing slowly with mobility 2* pain and limited endurance (premorbid deconditioning)   Follow Up Recommendations  Home health PT     Equipment Recommendations  3in1 (PT)    Recommendations for Other Services OT consult     Precautions / Restrictions Precautions Precautions: Fall Restrictions Weight Bearing Restrictions: No LLE Weight Bearing: Weight bearing as tolerated    Mobility  Bed Mobility Overal bed mobility: Needs Assistance Bed Mobility: Sit to Supine     Supine to sit: Min assist Sit to supine: Min assist;Mod assist   General bed mobility comments: Increased time with use of bed rail and assist to manage  bil LEs to side  Transfers Overall transfer level: Needs assistance Equipment used: Rolling walker (2 wheeled) Transfers: Sit to/from [employer] Sit to Stand: Min guard;From elevated surface Stand pivot transfers: Min guard       General transfer comment: min cues for transition position and use of UEs to self assist  Ambulation/Gait Ambulation/Gait assistance: Min assist;Min guard Gait Distance (Feet): 7 Feet Assistive device: Rolling walker (2 wheeled) Gait Pattern/deviations: Step-to pattern;Decreased step length - right;Decreased step length - left;Shuffle;Trunk flexed Gait velocity: decr   General Gait Details: cues for posture, position from RW and sequence; distance ltd by c/o increasing fatigue/pain   Optometrist    Modified Rankin (Stroke Patients Only)       Balance Overall balance assessment: Needs assistance Sitting-balance support: No upper extremity supported;Feet supported Sitting  balance-Leahy Scale: Good     Standing balance support: Bilateral upper extremity supported;During functional activity Standing balance-Leahy Scale: Fair Standing balance comment: able to take hands off of walker for clothng management                            Cognition Arousal/Alertness: Awake/alert Behavior During Therapy: WFL for tasks assessed/performed Overall Cognitive Status: Within Functional Limits for tasks assessed                                        Exercises Total Joint Exercises Ankle Circles/Pumps: AROM;Both;15 reps;Supine Quad Sets: AROM;Both;10 reps;Supine Heel Slides: AAROM;Left;20 reps;Supine Hip ABduction/ADduction: AAROM;Left;15 reps;Supine    General Comments        Pertinent Vitals/Pain Pain Assessment: 0-10 Pain Score: 7  Pain Location: L hip Pain Descriptors / Indicators: Aching;Sore Pain Intervention(s): Limited activity within patient's tolerance;Monitored during session;Premedicated before session    Home Living                      Prior Function            PT Goals (current goals can now be found in the care plan section) Acute Rehab PT Goals Patient Stated Goal: HOME and NOT TO SNF PT Goal Formulation: With patient Time For Goal Achievement: 06/24/20 Potential to Achieve Goals: Good Progress towards PT goals: Progressing toward goals    Frequency    7X/week      PT Plan  Current plan remains appropriate    Co-evaluation              AM-PAC PT "6 Clicks" Mobility   Outcome Measure  Help needed turning from your back to your side while in a flat bed without using bedrails?: A Lot Help needed moving from lying on your back to sitting on the side of a flat bed without using bedrails?: A Lot Help needed moving to and from a bed to a chair (including a wheelchair)?: A Little Help needed standing up from a chair using your arms (e.g., wheelchair or bedside chair)?: A Little Help  needed to walk in hospital room?: A Little Help needed climbing 3-5 steps with a railing? : A Lot 6 Click Score: 15    End of Session Equipment Utilized During Treatment: Gait belt Activity Tolerance: Patient limited by fatigue Patient left: in bed Nurse Communication: Mobility status PT Visit Diagnosis: Difficulty in walking, not elsewhere classified (R26.2)     Time: 2130-8657 PT Time Calculation (min) (ACUTE ONLY): 34 min  Charges:  $Gait Training: 8-22 mins $Therapeutic Exercise: 8-22 mins                     Mauro Kaufmann PT Acute Rehabilitation Services Pager [phone] Office [phone]    [name] 06/18/2020, 4:00 PM

## 2020-06-18 NOTE — Progress Notes (Signed)
Physical Therapy Treatment Patient Details Name: [name] MRN: [id number] DOB: Jun 11, 1951 Today's Date: 06/18/2020    History of Present Illness Pt s/p fall with L hip fx and now s/p THR.  Pt with hx of TIA, DM, [name], and CHF    PT Comments    Pt requesting assist to use bathroom.  Ltd by fatigue and pain this am and unable to complete distance to walk to bathroom - use of BSC and then assisted from [facility] to ambulate short distance and transfer to chair for bfast.   Follow Up Recommendations  Home health PT     Equipment Recommendations  3in1 (PT)    Recommendations for Other Services OT consult     Precautions / Restrictions Precautions Precautions: Fall Restrictions Weight Bearing Restrictions: No LLE Weight Bearing: Weight bearing as tolerated    Mobility  Bed Mobility Overal bed mobility: Needs Assistance Bed Mobility: Supine to Sit     Supine to sit: Min assist     General bed mobility comments: Increased time with use of bedrails, cues for sequence and use of R LE to self assist; physical assist to manage L LE and to complete rotation to EOB sitting  Transfers Overall transfer level: Needs assistance Equipment used: Rolling walker (2 wheeled) Transfers: Sit to/from Stand Sit to Stand: Min assist         General transfer comment: cues for LE management and use of UEs to self assist.  Physical assist to bring wt up and fwd and to control descent  Ambulation/Gait Ambulation/Gait assistance: Min assist;Mod assist Gait Distance (Feet): 5 Feet (twice) Assistive device: Rolling walker (2 wheeled) Gait Pattern/deviations: Step-to pattern;Decreased step length - right;Decreased step length - left;Shuffle;Trunk flexed Gait velocity: decr   General Gait Details: cues for posture, position from RW and sequence; distance ltd by c/o increasing pain   Stairs             Wheelchair Mobility    Modified Rankin (Stroke Patients Only)       Balance  Overall balance assessment: Needs assistance Sitting-balance support: No upper extremity supported;Feet supported Sitting balance-Leahy Scale: Fair     Standing balance support: Bilateral upper extremity supported;During functional activity Standing balance-Leahy Scale: Poor                              Cognition Arousal/Alertness: Awake/alert Behavior During Therapy: WFL for tasks assessed/performed Overall Cognitive Status: Within Functional Limits for tasks assessed                                        Exercises      General Comments        Pertinent Vitals/Pain Pain Assessment: 0-10 Pain Score: 7  Pain Location: L hip Pain Descriptors / Indicators: Aching;Sore Pain Intervention(s): Limited activity within patient's tolerance;Monitored during session;Patient requesting pain meds-RN notified    Home Living                      Prior Function            PT Goals (current goals can now be found in the care plan section) Acute Rehab PT Goals Patient Stated Goal: HOME and NOT TO SNF PT Goal Formulation: With patient Time For Goal Achievement: 06/24/20 Potential to Achieve Goals: Good Progress towards PT goals: Not  progressing toward goals - comment    Frequency    7X/week      PT Plan Current plan remains appropriate    Co-evaluation              AM-PAC PT "6 Clicks" Mobility   Outcome Measure  Help needed turning from your back to your side while in a flat bed without using bedrails?: A Lot Help needed moving from lying on your back to sitting on the side of a flat bed without using bedrails?: A Lot Help needed moving to and from a bed to a chair (including a wheelchair)?: A Little Help needed standing up from a chair using your arms (e.g., wheelchair or bedside chair)?: A Little Help needed to walk in hospital room?: A Little Help needed climbing 3-5 steps with a railing? : A Lot 6 Click Score: 15    End  of Session Equipment Utilized During Treatment: Gait belt Activity Tolerance: Patient limited by pain;Patient limited by fatigue Patient left: in chair;with call bell/phone within reach;with chair alarm set Nurse Communication: Mobility status PT Visit Diagnosis: Difficulty in walking, not elsewhere classified (R26.2)     Time: 7169-6789 PT Time Calculation (min) (ACUTE ONLY): 25 min  Charges:  $Gait Training: 8-22 mins $Therapeutic Activity: 8-22 mins                     Mauro Kaufmann PT Acute Rehabilitation Services Pager [phone] Office [phone]    Aanika Defoor 06/18/2020, 12:16 PM

## 2020-06-18 NOTE — Progress Notes (Signed)
Physical Therapy Treatment Patient Details Name: [name] MRN: [id number] DOB: 1951/01/09 Today's Date: 06/18/2020    History of Present Illness Pt s/p fall with L hip fx and now s/p THR.  Pt with hx of TIA, DM, [name], and CHF    PT Comments    Pt continues cooperative and progressing slowly but steadily with mobility.  This am, pt ambulated increased distance and completed toileting with min assist.   Follow Up Recommendations  Home health PT     Equipment Recommendations  3in1 (PT)    Recommendations for Other Services OT consult     Precautions / Restrictions Precautions Precautions: Fall Restrictions Weight Bearing Restrictions: No LLE Weight Bearing: Weight bearing as tolerated    Mobility  Bed Mobility Overal bed mobility: Needs Assistance Bed Mobility: Sit to Supine     Supine to sit: Min assist Sit to supine: Min assist;Mod assist   General bed mobility comments: Increased time with cues and assist to manage LEs onto bed  Transfers Overall transfer level: Needs assistance Equipment used: Rolling walker (2 wheeled) Transfers: Sit to/from Stand Sit to Stand: Min assist;Min guard         General transfer comment: cues for LE management and use of UEs to self assist.  Physical assist to bring wt up and fwd and to control descent  Ambulation/Gait Ambulation/Gait assistance: Min assist Gait Distance (Feet): 25 Feet (and additional 5' from [facility] to bedside) Assistive device: Rolling walker (2 wheeled) Gait Pattern/deviations: Step-to pattern;Decreased step length - right;Decreased step length - left;Shuffle;Trunk flexed Gait velocity: decr   General Gait Details: cues for posture, position from RW and sequence; distance ltd by c/o increasing fatigue/pain   Optometrist    Modified Rankin (Stroke Patients Only)       Balance Overall balance assessment: Needs assistance Sitting-balance support: No upper extremity  supported;Feet supported Sitting balance-Leahy Scale: Good     Standing balance support: Bilateral upper extremity supported;During functional activity Standing balance-Leahy Scale: Fair                              Cognition Arousal/Alertness: Awake/alert Behavior During Therapy: WFL for tasks assessed/performed Overall Cognitive Status: Within Functional Limits for tasks assessed                                        Exercises      General Comments        Pertinent Vitals/Pain Pain Assessment: 0-10 Pain Score: 6  Pain Location: L hip Pain Descriptors / Indicators: Aching;Sore Pain Intervention(s): Limited activity within patient's tolerance;Monitored during session;Premedicated before session;Ice applied    Home Living                      Prior Function            PT Goals (current goals can now be found in the care plan section) Acute Rehab PT Goals Patient Stated Goal: HOME and NOT TO SNF PT Goal Formulation: With patient Time For Goal Achievement: 06/24/20 Potential to Achieve Goals: Good Progress towards PT goals: Progressing toward goals    Frequency    7X/week      PT Plan Current plan remains appropriate    Co-evaluation  AM-PAC PT "6 Clicks" Mobility   Outcome Measure  Help needed turning from your back to your side while in a flat bed without using bedrails?: A Lot Help needed moving from lying on your back to sitting on the side of a flat bed without using bedrails?: A Lot Help needed moving to and from a bed to a chair (including a wheelchair)?: A Little Help needed standing up from a chair using your arms (e.g., wheelchair or bedside chair)?: A Little Help needed to walk in hospital room?: A Little Help needed climbing 3-5 steps with a railing? : A Lot 6 Click Score: 15    End of Session Equipment Utilized During Treatment: Gait belt Activity Tolerance: Patient limited by  fatigue Patient left: in bed Nurse Communication: Mobility status PT Visit Diagnosis: Difficulty in walking, not elsewhere classified (R26.2)     Time: 0254-2706 PT Time Calculation (min) (ACUTE ONLY): 38 min  Charges:  $Gait Training: 23-37 mins $Therapeutic Activity: 8-22 mins                     Mauro Kaufmann PT Acute Rehabilitation Services Pager [phone] Office [phone]    Kaya Klausing 06/18/2020, 12:25 PM

## 2020-06-19 DIAGNOSIS — R9431 Abnormal electrocardiogram [ECG] [EKG]: Secondary | ICD-10-CM

## 2020-06-19 DIAGNOSIS — J189 Pneumonia, unspecified organism: Secondary | ICD-10-CM

## 2020-06-19 DIAGNOSIS — S72002D Fracture of unspecified part of neck of left femur, subsequent encounter for closed fracture with routine healing: Secondary | ICD-10-CM

## 2020-06-19 DIAGNOSIS — E119 Type 2 diabetes mellitus without complications: Secondary | ICD-10-CM

## 2020-06-19 LAB — BASIC METABOLIC PANEL
Anion gap: 11 (ref 5–15)
BUN: 42 mg/dL — ABNORMAL HIGH (ref 8–23)
CO2: 27 mmol/L (ref 22–32)
Calcium: 8.3 mg/dL — ABNORMAL LOW (ref 8.9–10.3)
Chloride: 100 mmol/L (ref 98–111)
Creatinine, Ser: 1.29 mg/dL — ABNORMAL HIGH (ref 0.61–1.24)
GFR, Estimated: 60 mL/min — ABNORMAL LOW (ref >60)
Glucose, Bld: 186 mg/dL — ABNORMAL HIGH (ref 70–99)
Potassium: 4.1 mmol/L (ref 3.5–5.1)
Sodium: 138 mmol/L (ref 135–145)

## 2020-06-19 LAB — CBC
HCT: 25.9 % — ABNORMAL LOW (ref 39.0–52.0)
Hemoglobin: 8.3 g/dL — ABNORMAL LOW (ref 13.0–17.0)
MCH: 29 pg (ref 26.0–34.0)
MCHC: 32 g/dL (ref 30.0–36.0)
MCV: 90.6 fL (ref 80.0–100.0)
Platelets: 236 10*3/uL (ref 150–400)
RBC: 2.86 MIL/uL — ABNORMAL LOW (ref 4.22–5.81)
RDW: 14.1 % (ref 11.5–15.5)
WBC: 10.4 10*3/uL (ref 4.0–10.5)
nRBC: 0 % (ref 0.0–0.2)

## 2020-06-19 LAB — MAGNESIUM: Magnesium: 1.8 mg/dL (ref 1.7–2.4)

## 2020-06-19 LAB — GLUCOSE, CAPILLARY
Glucose-Capillary: 145 mg/dL — ABNORMAL HIGH (ref 70–99)
Glucose-Capillary: 140 mg/dL — ABNORMAL HIGH (ref 70–99)
Glucose-Capillary: 159 mg/dL — ABNORMAL HIGH (ref 70–99)

## 2020-06-19 MED ORDER — DOCUSATE SODIUM 100 MG PO CAPS: 100.0000 mg | ORAL_CAPSULE | Freq: Two times a day (BID) | ORAL | 0 refills | Status: AC

## 2020-06-19 MED ORDER — SODIUM CHLORIDE 0.9 % IV SOLN: INTRAVENOUS | Status: DC | PRN

## 2020-06-19 NOTE — Progress Notes (Signed)
Subjective: 3 Days Post-Op Procedure(s) (LRB): TOTAL HIP ARTHROPLASTY ANTERIOR APPROACH (Left) Patient reports pain as mild.   Sitting up comfortably in a chair Says he is going home today with home health  Objective: Vital signs in last 24 hours: Temp:  [98.1 F (36.7 C)-98.4 F (36.9 C)] 98.1 F (36.7 C) (12/19 0510) Pulse Rate:  [77-85] 77 (12/19 0510) Resp:  [18-20] 19 (12/19 0510) BP: (110-131)/(64-76) 131/76 (12/19 0510) SpO2:  [92 %-100 %] 99 % (12/19 0510)  Intake/Output from previous day: 12/18 0701 - 12/19 0700 In: 948.3 [P.O.:840; I.V.:8.3; IV Piggyback:100] Out: 650 [Urine:650] Intake/Output this shift: No intake/output data recorded.  Recent Labs    06/17/20 0333 06/18/20 0256 06/18/20 0753 06/19/20 0253  HGB 9.6* 8.2* 9.1* 8.3*   Recent Labs    06/18/20 0256 06/18/20 0753 06/19/20 0253  WBC 10.5  --  10.4  RBC 2.79*  --  2.86*  HCT 25.7* 28.7* 25.9*  PLT 231  --  236   Recent Labs    06/18/20 0256 06/19/20 0253  NA 138 138  K 3.8 4.1  CL 100 100  CO2 27 27  BUN 47* 42*  CREATININE 1.49* 1.29*  GLUCOSE 167* 186*  CALCIUM 7.8* 8.3*   No results for input(s): LABPT, INR in the last 72 hours.  Dorsiflexion/Plantar flexion intact  Mild edema BLE Incision clean without erythema or exudate   Assessment/Plan: 3 Days Post-Op Procedure(s) (LRB): TOTAL HIP ARTHROPLASTY ANTERIOR APPROACH (Left) Up with therapy Discharge home with home health  F/U in clinic with Dr [name] in 2 weeks     [name] [name] 06/19/2020, 7:55 AM

## 2020-06-19 NOTE — Progress Notes (Signed)
Physical Therapy Treatment Patient Details Name: [name] MRN: [id number] DOB: Nov 12, 1950 Today's Date: 06/19/2020    History of Present Illness Pt s/p fall with L hip fx and now s/p THR.  Pt with hx of TIA, DM, [name], and CHF    PT Comments    Pt assisted up to ambulate with min guard but limited by bowel urgency and placed on BSC.  Attempted to re-initiate session x 2 but with each attempt to stand pt states "oh, I think I still have to go"  Pt left on [facility] with call light and CNA alerted.   Follow Up Recommendations  Home health PT     Equipment Recommendations  3in1 (PT)    Recommendations for Other Services OT consult     Precautions / Restrictions Precautions Precautions: Fall Restrictions Weight Bearing Restrictions: No LLE Weight Bearing: Weight bearing as tolerated    Mobility  Bed Mobility               General bed mobility comments: Pt up in chair on arrival and on BSC at end  Transfers Overall transfer level: Needs assistance Equipment used: Rolling walker (2 wheeled) Transfers: Sit to/from [employer] Sit to Stand: Min guard Stand pivot transfers: Min guard       General transfer comment: min cues for transition position and use of UEs to self assist  Ambulation/Gait Ambulation/Gait assistance: Min guard Gait Distance (Feet): 5 Feet Assistive device: Rolling walker (2 wheeled) Gait Pattern/deviations: Step-to pattern;Decreased step length - right;Decreased step length - left;Shuffle;Trunk flexed Gait velocity: decr   General Gait Details: cues for posture, position from RW and sequence; distance ltd byneed for [facility]   Stairs             Wheelchair Mobility    Modified Rankin (Stroke Patients Only)       Balance Overall balance assessment: Needs assistance Sitting-balance support: No upper extremity supported;Feet supported Sitting balance-Leahy Scale: Good     Standing balance support: Bilateral upper  extremity supported;During functional activity Standing balance-Leahy Scale: Fair                              Cognition Arousal/Alertness: Awake/alert Behavior During Therapy: WFL for tasks assessed/performed Overall Cognitive Status: Within Functional Limits for tasks assessed                                        Exercises      General Comments        Pertinent Vitals/Pain Pain Assessment: 0-10 Pain Score: 6  Pain Location: L hip Pain Descriptors / Indicators: Aching;Sore Pain Intervention(s): Limited activity within patient's tolerance;Monitored during session;Premedicated before session    Home Living                      Prior Function            PT Goals (current goals can now be found in the care plan section) Acute Rehab PT Goals Patient Stated Goal: HOME and NOT TO SNF PT Goal Formulation: With patient Time For Goal Achievement: 06/24/20 Potential to Achieve Goals: Good Progress towards PT goals: Progressing toward goals    Frequency    7X/week      PT Plan Current plan remains appropriate    Co-evaluation  AM-PAC PT "6 Clicks" Mobility   Outcome Measure  Help needed turning from your back to your side while in a flat bed without using bedrails?: A Lot Help needed moving from lying on your back to sitting on the side of a flat bed without using bedrails?: A Lot Help needed moving to and from a bed to a chair (including a wheelchair)?: A Little Help needed standing up from a chair using your arms (e.g., wheelchair or bedside chair)?: A Little Help needed to walk in hospital room?: A Little Help needed climbing 3-5 steps with a railing? : A Lot 6 Click Score: 15    End of Session Equipment Utilized During Treatment: Gait belt Activity Tolerance: Patient tolerated treatment well Patient left: Other (comment) (BSC) Nurse Communication: Mobility status PT Visit Diagnosis: Difficulty in  walking, not elsewhere classified (R26.2)     Time: 8938-1017 PT Time Calculation (min) (ACUTE ONLY): 19 min  Charges:  $Therapeutic Activity: 8-22 mins                     Mauro Kaufmann PT Acute Rehabilitation Services Pager [phone] Office [phone]    Deavion Dobbs 06/19/2020, 1:18 PM

## 2020-06-19 NOTE — Progress Notes (Addendum)
Physical Therapy Treatment Patient Details Name: [name] MRN: [id number] DOB: [birth year] Today's Date: 06/19/2020    History of Present Illness Pt s/p fall with L hip fx and now s/p THR.  Pt with hx of TIA, DM, [name], and CHF    PT Comments    Pt up to ambulate in room and requiring decreased assist for all tasks but continues limited by c/o pain and by poor endurance. Pt goal is to return home with [facility] and family assist.  Pt will need non-emergency transport home to safely enter home which includes stairs with no handrails.  Follow Up Recommendations  Home health PT     Equipment Recommendations  3in1 (PT)    Recommendations for Other Services OT consult     Precautions / Restrictions Precautions Precautions: Fall Restrictions Weight Bearing Restrictions: No LLE Weight Bearing: Weight bearing as tolerated    Mobility  Bed Mobility               General bed mobility comments: Pt up in chair and requests back to same  Transfers Overall transfer level: Needs assistance Equipment used: Rolling walker (2 wheeled) Transfers: Sit to/from Stand Sit to Stand: Min guard;Supervision Stand pivot transfers: Min guard       General transfer comment: min cues for transition position and use of UEs to self assist  Ambulation/Gait Ambulation/Gait assistance: Min guard Gait Distance (Feet): 22 Feet Assistive device: Rolling walker (2 wheeled) Gait Pattern/deviations: Step-to pattern;Decreased step length - right;Decreased step length - left;Shuffle;Trunk flexed Gait velocity: decr   General Gait Details: cues for posture, position from RW and sequence; distance ltd byneed for [facility]   Stairs             Wheelchair Mobility    Modified Rankin (Stroke Patients Only)       Balance Overall balance assessment: Needs assistance Sitting-balance support: No upper extremity supported;Feet supported Sitting balance-Leahy Scale: Good     Standing balance support:  Bilateral upper extremity supported;During functional activity Standing balance-Leahy Scale: Fair Standing balance comment: able to take hands off of walker to manage urinal                            Cognition Arousal/Alertness: Awake/alert Behavior During Therapy: WFL for tasks assessed/performed Overall Cognitive Status: Within Functional Limits for tasks assessed                                        Exercises      General Comments        Pertinent Vitals/Pain Pain Assessment: 0-10 Pain Score: 6  Pain Location: L hip Pain Descriptors / Indicators: Aching;Sore Pain Intervention(s): Limited activity within patient's tolerance;Monitored during session;Premedicated before session;Ice applied    Home Living                      Prior Function            PT Goals (current goals can now be found in the care plan section) Acute Rehab PT Goals Patient Stated Goal: HOME and NOT TO SNF PT Goal Formulation: With patient Time For Goal Achievement: 06/24/20 Potential to Achieve Goals: Good Progress towards PT goals: Progressing toward goals    Frequency    7X/week      PT Plan Current plan remains appropriate  Co-evaluation              AM-PAC PT "6 Clicks" Mobility   Outcome Measure  Help needed turning from your back to your side while in a flat bed without using bedrails?: A Lot Help needed moving from lying on your back to sitting on the side of a flat bed without using bedrails?: A Lot Help needed moving to and from a bed to a chair (including a wheelchair)?: A Little Help needed standing up from a chair using your arms (e.g., wheelchair or bedside chair)?: A Little Help needed to walk in hospital room?: A Little Help needed climbing 3-5 steps with a railing? : A Lot 6 Click Score: 15    End of Session Equipment Utilized During Treatment: Gait belt Activity Tolerance: Patient tolerated treatment well;Patient  limited by fatigue Patient left: in chair;with call bell/phone within reach;with chair alarm set Nurse Communication: Mobility status PT Visit Diagnosis: Difficulty in walking, not elsewhere classified (R26.2)     Time: 1040-1108 PT Time Calculation (min) (ACUTE ONLY): 28 min  Charges:  $Gait Training: 23-37 mins $Therapeutic Activity: 8-22 mins                     Mauro Kaufmann PT Acute Rehabilitation Services Pager [phone] Office [phone]    Tanya Crothers 06/19/2020, 1:24 PM

## 2020-06-19 NOTE — Plan of Care (Signed)
  Problem: Health Behavior/Discharge Planning: Goal: Ability to manage health-related needs will improve Outcome: Progressing   Problem: Activity: Goal: Risk for activity intolerance will decrease Outcome: Progressing   

## 2020-06-19 NOTE — Progress Notes (Signed)
Pt is discharged home with PTAR. 3 in 1 in the nursing station to be returned. Charge RN is aware. Pt left in stable condition. Spoke with Genia Del and updated.

## 2020-06-19 NOTE — TOC Progression Note (Signed)
Transition of Care [facility]) - Progression Note    Patient Details  Name: [name] MRN: [id number] Date of Birth: 05/10/1951  Transition of Care [facility]) CM/SW Contact  Armanda Heritage, RN Phone Number: 06/19/2020, 12:20 PM  Clinical Narrative:    CM received request from bedside RN to contact patient's spouse who had some questions.  CM spoke with Spouse who reports patient has 3in1 at home and requests that the 3in1 provided by [employer] be returned and patient not billed.  CM reached out to [employer] rep Lucrecia and asked for 3in1 to be picked up and return to [employer] and that patient not be billed since he does not need this equipment.  Spouse also asks about any financial assistance available.  Reports that they are running out of money as they had a very expensive repair in the home and have no money left for any more bills. Spouse reports that they are raising their grandchildren.  CM spoke with spouse about calling the financial services department when bills arrive to discuss assistance and payment plan options.  CM also spoke with spouse and provided information about DSS on [address] in [place] and reaching out to them to see if any financial assistance is available in the form of food stamps/ energy bills assistance/ or housing costs assistance since the patient and spouse are elderly and have minors in the home.  Patient to transport by ambulance to home today, there are steps to get into the home with no hand railings or any safe way for patient to navigate them in light of his surgery.    Expected Discharge Plan: Home w Home Health Services Barriers to Discharge: No Barriers Identified  Expected Discharge Plan and Services Expected Discharge Plan: Home w Home Health Services   Discharge Planning Services: CM Consult Post Acute Care Choice: Home Health Living arrangements for the past 2 months: Single Family Home Expected Discharge Date: 06/19/20               DME Arranged:  3-N-1 DME Agency: [employer] Date DME Agency Contacted: 06/18/20 Time DME Agency Contacted: [phone] Representative spoke with at DME Agency: lucrecia HH Arranged: PT HH Agency: [facility] Date [facility] Agency Contacted: 06/18/20 Time HH Agency Contacted: 1402 Representative spoke with at [facility] Agency: Kandee Keen   Social Determinants of Health (SDOH) Interventions    Readmission Risk Interventions Readmission Risk Prevention Plan 10/17/2018  Transportation Screening Complete  PCP or Specialist Appt within 3-5 Days Complete  HRI or Home Care Consult Complete  Social Work Consult for Recovery Care Planning/Counseling Complete  Palliative Care Screening Not Applicable  Medication Review Oceanographer) Complete  Some recent data might be hidden

## 2020-06-19 NOTE — Discharge Summary (Addendum)
Physician Discharge Summary  [name] HGD:[id number] DOB: 10-28-1950 DOA: 06/14/2020  PCP: [facility]  Admit date: 06/14/2020 Discharge date: 06/19/2020  Time spent: 50 minutes  Recommendations for Outpatient Follow-up:  1. Follow-up Dr. Lyla Glassing in 2 weeks   Discharge Diagnoses:  Principal Problem:   Fracture of femoral neck, left, closed ([place]) Active Problems:   HTN (hypertension)   Chronic systolic heart failure (HCC)   Diabetes mellitus type 2, controlled, without complications (HCC)   OSA (obstructive sleep apnea)   CAP (community acquired pneumonia)   [name] (chronic kidney disease), stage III ([place])   Prolonged QT interval   Discharge Condition: Stable  Diet recommendation: Carb modified diet  Filed Weights   06/14/20 2247  Weight: 91.7 kg    History of present illness:  69 year old male with history of diabetes mellitus type 2, hypertension, CHF, CAD, [name] stage III was admitted with mechanical fall with left hip fracture.  He was recently diagnosed with community-acquired pneumonia and was prescribed doxycycline.  Orthopedics was consulted.  Patient underwent left total hip arthroplasty on 06/16/2020.  Ortho commended weightbearing as tolerated.  PT recommended skilled nursing facility but patient prefer to go home.  Hospital Course:   Left femoral neck fracture-s/p ORIF-patient s/p mechanical fall with left femoral neck fracture.  Orthopedics was consulted.  Patient underwent left total hip arthroplasty on 06/16/2020.  Patient wants to go home with physical therapy.  DVT prophylaxis with aspirin as per orthopedics.  Community-acquired pneumonia-patient was started on ceftriaxone in the hospital he was already on doxycycline as outpatient.  He has received 5 days of antibiotics in the hospital.  Will discontinue antibiotics.  Patient is asymptomatic at this time.  Prolonged QTC-QTC was prolonged at 533 on admission 06/14/2020, magnesium was 1.7,  replaced.  Today magnesium 1.8.  Potassium is 4.1.  Avoid QTC prolonging medications.  Repeat EKG done today shows QTC 497.  Diabetes mellitus type 2-hemoglobin A1c 7.8.  Continue Metformin.  Follow-up PCP for further management of diabetes.  Hypertension-continue home medications  Congestive heart failure with reduced EF-EF 45% with grade 1 diastolic dysfunction.  Continue home medications.  Peripheral neuropathy-continue gabapentin   OSA-continue CPAP at bedtime   Procedures:  ORIF left femoral neck fracture  Consultations:  Orthopedics  Discharge Exam: Vitals:   06/18/20 2232 06/19/20 0510  BP: 111/64 131/76  Pulse: 80 77  Resp: 18 19  Temp: 98.4 F (36.9 C) 98.1 F (36.7 C)  SpO2: 99% 99%    General: Appears in no acute distress Cardiovascular: S1-S2, regular Respiratory: Clear to auscultation bilaterally  Discharge Instructions   Discharge Instructions    Diet - low sodium heart healthy   Complete by: As directed    Increase activity slowly   Complete by: As directed    No wound care   Complete by: As directed      Allergies as of 06/19/2020      Reactions   Cashew Nut Oil Palpitations   Percocet [oxycodone-acetaminophen] Other (See Comments)   Hallucintaions      Medication List    STOP taking these medications   aspirin EC 81 MG tablet Replaced by: aspirin 81 MG chewable tablet   hydrOXYzine 50 MG capsule Commonly known as: VISTARIL     TAKE these medications   Accu-Chek FastClix Lancets Misc USE UTD   Accu-Chek Guide test strip Generic drug: glucose blood U TO TEST BLOOD SUGAR QD   Accu-Chek Guide w/Device Kit U TO TEST BLOOD SUGAR  QD   aspirin 81 MG chewable tablet Commonly known as: Aspirin Childrens Chew 1 tablet (81 mg total) by mouth 2 (two) times daily with a meal. Replaces: aspirin EC 81 MG tablet   atorvastatin 40 MG tablet Commonly known as: LIPITOR Take 1 tablet (40 mg total) by mouth every evening.   carvedilol  12.5 MG tablet Commonly known as: COREG TAKE ONE TABLET BY MOUTH TWICE A DAY What changed: when to take this   docusate sodium 100 MG capsule Commonly known as: COLACE Take 1 capsule (100 mg total) by mouth 2 (two) times daily for 14 days.   famotidine 20 MG tablet Commonly known as: PEPCID Take 20 mg by mouth at bedtime.   fenofibrate 48 MG tablet Commonly known as: TRICOR Take 48 mg by mouth daily.   finasteride 5 MG tablet Commonly known as: PROSCAR Take 5 mg by mouth daily.   gabapentin 400 MG capsule Commonly known as: NEURONTIN Take 400 mg by mouth 2 (two) times daily.   HYDROcodone-acetaminophen 5-325 MG tablet Commonly known as: NORCO/VICODIN Take 1-2 tablets by mouth every 6 (six) hours as needed for moderate pain.   metFORMIN 1000 MG tablet Commonly known as: GLUCOPHAGE Take 0.5 tablets (500 mg total) by mouth 2 (two) times daily with a meal.   montelukast 10 MG tablet Commonly known as: SINGULAIR Take 10 mg by mouth daily.   multivitamin tablet Take 1 tablet by mouth daily.   pantoprazole 40 MG tablet Commonly known as: Protonix Take 1 tablet (40 mg total) by mouth daily.   potassium chloride SA 20 MEQ tablet Commonly known as: KLOR-CON Take 20 mEq by mouth daily.   Robitussin Lingering CoughGels 15 MG Caps Generic drug: Dextromethorphan HBr Take 15 mg by mouth daily as needed (cough).   Symbicort 80-4.5 MCG/ACT inhaler Generic drug: budesonide-formoterol Inhale 2 puffs into the lungs daily as needed (wheezing).   tamsulosin 0.4 MG Caps capsule Commonly known as: FLOMAX Take 0.4 mg by mouth daily.   torsemide 20 MG tablet Commonly known as: DEMADEX Take 2 tablets (40 mg total) by mouth in the morning AND 1 tablet (20 mg total) every evening.   Ventolin HFA 108 (90 Base) MCG/ACT inhaler Generic drug: albuterol Inhale 2 puffs into the lungs every 6 (six) hours as needed for wheezing or shortness of breath.            Durable Medical  Equipment  (From admission, onward)         Start     Ordered   06/18/20 1410  For home use only DME 3 n 1  Once        06/18/20 1410         Allergies  Allergen Reactions  . Cashew Nut Oil Palpitations  . Percocet [Oxycodone-Acetaminophen] Other (See Comments)    Hallucintaions    Follow-up Information    Swinteck, Arlys John, MD. Schedule an appointment as soon as possible for a visit in 2 weeks.   Specialty: Orthopedic Surgery Why: For wound re-check, For suture removal Contact information: [address] [place] [address] [phone]        [facility] Follow up.   Specialty: Home Health Services Why: agency will provide home health physical therapy. Contact information: [address] [place] [address] [phone]                The results of significant diagnostics from this hospitalization (including imaging, microbiology, ancillary and  laboratory) are listed below for reference.    Significant Diagnostic Studies: DG Chest 1 View  Result Date: 06/14/2020 CLINICAL DATA:  Left hip pain after fall EXAM: CHEST  1 VIEW COMPARISON:  02/25/2019 FINDINGS: Cardiomegaly. Patchy bilateral airspace disease. Mild vascular congestion. No effusions or pneumothorax. No acute bony abnormality. IMPRESSION: Cardiomegaly, vascular congestion. Patchy bilateral airspace disease concerning for pneumonia. Electronically Signed   By: Rolm Baptise M.D.   On: 06/14/2020 02:18   Pelvis Portable  Result Date: 06/16/2020 CLINICAL DATA:  Post left hip replacement. EXAM: PORTABLE PELVIS 1-2 VIEWS COMPARISON:  Preoperative radiograph 06/14/2020 FINDINGS: Left hip arthroplasty in expected alignment. There is no periprosthetic lucency or fracture. Recent postsurgical change includes air and edema in the joint space and soft tissues. Lateral skin staples in place. Pubic rami are intact. IMPRESSION: Left hip arthroplasty without immediate  postoperative complication. Electronically Signed   By: Keith Rake M.D.   On: 06/16/2020 18:25   DG C-Arm 1-60 Min-No Report  Result Date: 06/16/2020 Fluoroscopy was utilized by the requesting physician.  No radiographic interpretation.   DG HIP OPERATIVE UNILAT W OR W/O PELVIS LEFT  Result Date: 06/16/2020 CLINICAL DATA:  Left hip arthroplasty EXAM: OPERATIVE LEFT HIP (WITH PELVIS IF PERFORMED) TECHNIQUE: Fluoroscopic spot image(s) were submitted for interpretation post-operatively. COMPARISON:  Preoperative radiograph 06/14/2020 FINDINGS: Two fluoroscopic spot views of the pelvis and left hip in frontal projection. Left hip arthroplasty in expected alignment. Total fluoroscopy time 17 seconds. IMPRESSION: Intraoperative fluoroscopy during left hip arthroplasty. Electronically Signed   By: Keith Rake M.D.   On: 06/16/2020 17:09   DG Hip Unilat W or Wo Pelvis 2-3 Views Left  Result Date: 06/14/2020 CLINICAL DATA:  Fall, left hip pain EXAM: DG HIP (WITH OR WITHOUT PELVIS) 2-3V LEFT COMPARISON:  None. FINDINGS: There is a left femoral neck fracture with angulation. No subluxation or dislocation. SI joints symmetric and unremarkable. IMPRESSION: Angulated left femoral neck fracture. Electronically Signed   By: Rolm Baptise M.D.   On: 06/14/2020 02:13    Microbiology: Recent Results (from the past 240 hour(s))  Resp Panel by RT-PCR (Flu A&B, Covid) Nasopharyngeal Swab     Status: None   Collection Time: 06/14/20  2:28 AM   Specimen: Nasopharyngeal Swab; Nasopharyngeal(NP) swabs in vial transport medium  Result Value Ref Range Status   SARS Coronavirus 2 by RT PCR NEGATIVE NEGATIVE Final    Comment: (NOTE) SARS-CoV-2 target nucleic acids are NOT DETECTED.  The SARS-CoV-2 RNA is generally detectable in upper respiratory specimens during the acute phase of infection. The lowest concentration of SARS-CoV-2 viral copies this assay can detect is 138 copies/mL. A negative result does  not preclude SARS-Cov-2 infection and should not be used as the sole basis for treatment or other patient management decisions. A negative result may occur with  improper specimen collection/handling, submission of specimen other than nasopharyngeal swab, presence of viral mutation(s) within the areas targeted by this assay, and inadequate number of viral copies(<138 copies/mL). A negative result must be combined with clinical observations, patient history, and epidemiological information. The expected result is Negative.  Fact Sheet for Patients:  EntrepreneurPulse.com.au  Fact Sheet for Healthcare Providers:  IncredibleEmployment.be  This test is no t yet approved or cleared by the [place] FDA and  has been authorized for detection and/or diagnosis of SARS-CoV-2 by FDA under an Emergency Use Authorization (EUA). This EUA will remain  in effect (meaning this test can be used) for the duration of the  COVID-19 declaration under Section 564(b)(1) of the Act, 21 U.S.C.section 360bbb-3(b)(1), unless the authorization is terminated  or revoked sooner.       Influenza A by PCR NEGATIVE NEGATIVE Final   Influenza B by PCR NEGATIVE NEGATIVE Final    Comment: (NOTE) The Xpert Xpress SARS-CoV-2/FLU/RSV plus assay is intended as an aid in the diagnosis of influenza from Nasopharyngeal swab specimens and should not be used as a sole basis for treatment. Nasal washings and aspirates are unacceptable for Xpert Xpress SARS-CoV-2/FLU/RSV testing.  Fact Sheet for Patients: EntrepreneurPulse.com.au  Fact Sheet for Healthcare Providers: IncredibleEmployment.be  This test is not yet approved or cleared by the [place] FDA and has been authorized for detection and/or diagnosis of SARS-CoV-2 by FDA under an Emergency Use Authorization (EUA). This EUA will remain in effect (meaning this test can be used) for the  duration of the COVID-19 declaration under Section 564(b)(1) of the Act, 21 U.S.C. section 360bbb-3(b)(1), unless the authorization is terminated or revoked.  Performed at [facility], [place] [address] [place], [place] [address]   Surgical PCR screen     Status: None   Collection Time: 06/14/20 11:30 PM   Specimen: Nasal Mucosa; Nasal Swab  Result Value Ref Range Status   MRSA, PCR NEGATIVE NEGATIVE Final   Staphylococcus aureus NEGATIVE NEGATIVE Final    Comment: (NOTE) The Xpert SA Assay (FDA approved for NASAL specimens in patients 37 years of age and older), is one component of a comprehensive surveillance program. It is not intended to diagnose infection nor to guide or monitor treatment. Performed at [facility], [place] [address], [place], [place] [address]      Labs: Basic Metabolic Panel: Recent Labs  Lab 06/14/20 0133 06/14/20 0727 06/16/20 0328 06/17/20 0333 06/18/20 0256 06/19/20 0253  NA 137  --  138 137 138 138  K 3.7  --  3.6 4.1 3.8 4.1  CL 98  --  99 99 100 100  CO2 24  --  $R'29 25 27 27  'Pi$ GLUCOSE 257*  --  143* 189* 167* 186*  BUN 44*  --  31* 34* 47* 42*  CREATININE 1.94* 1.63* 1.46* 1.41* 1.49* 1.29*  CALCIUM 8.7*  --  8.2* 8.1* 7.8* 8.3*  MG  --   --  1.7 1.7 1.7 1.8  PHOS  --   --   --  4.4 3.6  --    Liver Function Tests: No results for input(s): AST, ALT, ALKPHOS, BILITOT, PROT, ALBUMIN in the last 168 hours. No results for input(s): LIPASE, AMYLASE in the last 168 hours. No results for input(s): AMMONIA in the last 168 hours. CBC: Recent Labs  Lab 06/14/20 0133 06/14/20 0727 06/16/20 0328 06/17/20 0333 06/18/20 0256 06/18/20 0753 06/19/20 0253  WBC 16.5* 12.6* 11.3* 13.0* 10.5  --  10.4  NEUTROABS 15.0*  --   --   --   --   --   --   HGB 11.4* 10.8* 10.3* 9.6* 8.2* 9.1* 8.3*  HCT 36.1* 33.9* 31.7* 30.3* 25.7* 28.7* 25.9*  MCV 89.4 90.2 90.3 92.4 92.1  --  90.6  PLT 358 329 261 250 231  --  236   Cardiac  Enzymes: No results for input(s): CKTOTAL, CKMB, CKMBINDEX, TROPONINI in the last 168 hours. BNP: BNP (last 3 results) Recent Labs    06/14/20 0133  BNP 195.2*    ProBNP (last 3 results) No results for input(s): PROBNP in the last 8760 hours.  CBG: Recent Labs  Lab 06/18/20 1157 06/18/20 1707 06/18/20 2234 06/19/20 0743 06/19/20 1138  GLUCAP 200* 196* 189* 145* 140*       Signed:  Oswald Hillock MD.  [facility] 06/19/2020, 11:52 AM

## 2020-06-21 ENCOUNTER — Encounter (INDEPENDENT_AMBULATORY_CARE_PROVIDER_SITE_OTHER): Payer: Medicare Other | Admitting: Ophthalmology

## 2020-08-16 ENCOUNTER — Telehealth (HOSPITAL_COMMUNITY): Payer: Self-pay | Admitting: Adult Health

## 2020-08-16 NOTE — Telephone Encounter (Signed)
Goal 21- Cardiomems Reading 29    Weight 203 pounds today which has been stable.    I called and talked to his wife. She is concerned about his allergies and does not feel like this fluid. She agreed to give him and extra torsemide today and tomorrow.   He does not have another follow up for some time. She would like to hold off for now. Will continue follow readings.   Lila Lufkin NP-C  11:38 AM

## 2020-08-22 ENCOUNTER — Encounter (INDEPENDENT_AMBULATORY_CARE_PROVIDER_SITE_OTHER): Payer: Self-pay

## 2020-08-23 ENCOUNTER — Ambulatory Visit (INDEPENDENT_AMBULATORY_CARE_PROVIDER_SITE_OTHER): Payer: Medicare Other | Admitting: Ophthalmology

## 2020-08-23 ENCOUNTER — Other Ambulatory Visit: Payer: Self-pay

## 2020-08-23 DIAGNOSIS — H2512 Age-related nuclear cataract, left eye: Secondary | ICD-10-CM

## 2020-08-23 DIAGNOSIS — H353221 Exudative age-related macular degeneration, left eye, with active choroidal neovascularization: Secondary | ICD-10-CM | POA: Diagnosis not present

## 2020-08-23 DIAGNOSIS — E119 Type 2 diabetes mellitus without complications: Secondary | ICD-10-CM

## 2020-08-23 MED ORDER — BEVACIZUMAB 2.5 MG/0.1ML IZ SOSY
2.5000 mg | PREFILLED_SYRINGE | INTRAVITREAL | Status: AC | PRN
  Administered 2020-08-23: 2.5 mg via INTRAVITREAL

## 2020-08-23 NOTE — Assessment & Plan Note (Signed)
Some element of visual acuity impact yet not limiting

## 2020-08-23 NOTE — Progress Notes (Signed)
08/23/2020     CHIEF COMPLAINT Patient presents for Retina Follow Up (3 Month Wet AMD f\u OS. Possible Avastin OS. OCT/Pt states he does not see well out of OD. Pt c/o OU being dry./BGL: did not check)   HISTORY OF PRESENT ILLNESS: [name] is a 70 y.o. male who presents to the clinic today for:   HPI    Retina Follow Up    Patient presents with  Wet AMD.  In left eye.  Severity is moderate.  Duration of 3 months.  Since onset it is stable.  I, the attending physician,  performed the HPI with the patient and updated documentation appropriately. Additional comments: 3 Month Wet AMD f\u OS. Possible Avastin OS. OCT Pt states he does not see well out of OD. Pt c/o OU being dry. BGL: did not check       Last edited by Tilda Franco on 08/23/2020 10:05 AM. (History)      Referring physician: Care, Jinny Blossom [id number],  [place] [id number]  HISTORICAL INFORMATION:   Selected notes from the MEDICAL RECORD NUMBER    Lab Results  Component Value Date   HGBA1C 7.8 (H) 06/14/2020     CURRENT MEDICATIONS: No current outpatient medications on file. (Ophthalmic Drugs)   No current facility-administered medications for this visit. (Ophthalmic Drugs)   Current Outpatient Medications (Other)  Medication Sig  . Accu-Chek FastClix Lancets MISC USE UTD  . ACCU-CHEK GUIDE test strip U TO TEST BLOOD SUGAR QD  . atorvastatin (LIPITOR) 40 MG tablet Take 1 tablet (40 mg total) by mouth every evening. (Patient taking differently: Take 40 mg by mouth every evening.)  . Blood Glucose Monitoring Suppl (ACCU-CHEK GUIDE) w/Device KIT U TO TEST BLOOD SUGAR QD  . carvedilol (COREG) 12.5 MG tablet TAKE ONE TABLET BY MOUTH TWICE A DAY (Patient taking differently: Take 12.5 mg by mouth 2 (two) times daily with a meal.)  . Dextromethorphan HBr (ROBITUSSIN LINGERING COUGHGELS) 15 MG CAPS Take 15 mg by mouth daily as needed (cough).   . famotidine  (PEPCID) 20 MG tablet Take 20 mg by mouth at bedtime.  . fenofibrate (TRICOR) 48 MG tablet Take 48 mg by mouth daily.  . finasteride (PROSCAR) 5 MG tablet Take 5 mg by mouth daily.  Marland Kitchen gabapentin (NEURONTIN) 400 MG capsule Take 400 mg by mouth 2 (two) times daily.  Marland Kitchen HYDROcodone-acetaminophen (NORCO/VICODIN) 5-325 MG tablet Take 1-2 tablets by mouth every 6 (six) hours as needed for moderate pain.  . metFORMIN (GLUCOPHAGE) 1000 MG tablet Take 0.5 tablets (500 mg total) by mouth 2 (two) times daily with a meal.  . montelukast (SINGULAIR) 10 MG tablet Take 10 mg by mouth daily.   . Multiple Vitamin (MULTIVITAMIN) tablet Take 1 tablet by mouth daily.  . pantoprazole (PROTONIX) 40 MG tablet Take 1 tablet (40 mg total) by mouth daily.  . potassium chloride SA (KLOR-CON) 20 MEQ tablet Take 20 mEq by mouth daily.   . SYMBICORT 80-4.5 MCG/ACT inhaler Inhale 2 puffs into the lungs daily as needed (wheezing).  . tamsulosin (FLOMAX) 0.4 MG CAPS capsule Take 0.4 mg by mouth daily.  Marland Kitchen torsemide (DEMADEX) 20 MG tablet Take 2 tablets (40 mg total) by mouth in the morning AND 1 tablet (20 mg total) every evening.  . VENTOLIN HFA 108 (90 Base) MCG/ACT inhaler Inhale 2 puffs into the lungs every 6 (six) hours as needed for wheezing or shortness  of breath.    No current facility-administered medications for this visit. (Other)      REVIEW OF SYSTEMS:    ALLERGIES Allergies  Allergen Reactions  . Cashew Nut Oil Palpitations  . Percocet [Oxycodone-Acetaminophen] Other (See Comments)    Hallucintaions    PAST MEDICAL HISTORY Past Medical History:  Diagnosis Date  . 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 CHF (congestive heart failure) ([place])   . Chronic kidney disease   . Diabetes mellitus 10/17/2011   newly dx today  . Edema leg    right leg has leaky valve and right foot swells  . Emphysema   . Excessive ear wax   . Fatty liver   . Hernia    near  navel  . Hyperlipidemia   . Hypertension   . Leaky heart valve   . Neuropathy   . Rheumatic fever   . Sleep apnea   . Slow urinary stream   . TIA (transient ischemic attack)    Past Surgical History:  Procedure Laterality Date  . CIRCUMCISION    . LITHOTRIPSY    . PRESSURE SENSOR/CARDIOMEMS N/A 05/27/2019   Procedure: PRESSURE SENSOR/CARDIOMEMS;  Surgeon: Larey Dresser, MD;  Location: [facility];  Service: Cardiovascular;  Laterality: N/A;  . RIGHT HEART CATH N/A 05/27/2019   Procedure: RIGHT HEART CATH;  Surgeon: Larey Dresser, MD;  Location: [facility];  Service: Cardiovascular;  Laterality: N/A;  . TOTAL HIP ARTHROPLASTY Left 06/16/2020   Procedure: TOTAL HIP ARTHROPLASTY ANTERIOR APPROACH;  Surgeon: Rod Can, MD;  Location: [facility];  Service: Orthopedics;  Laterality: Left;    FAMILY HISTORY Family History  Problem Relation Age of Onset  . Emphysema Mother   . Aneurysm Mother   . Emphysema Father   . Coronary artery disease Brother   . Coronary artery disease Brother   . Coronary artery disease Sister     SOCIAL HISTORY Social History   Tobacco Use  . Smoking status: Never Smoker  . Smokeless tobacco: Never Used  Vaping Use  . Vaping Use: Never used  Substance Use Topics  . Alcohol use: No  . Drug use: No         OPHTHALMIC EXAM: Base Eye Exam    Visual Acuity (Snellen - Linear)      Right Left   Dist [place] CF @ 2' 20/30   Dist ph [place] NI        Tonometry (Tonopen, 10:10 AM)      Right Left   Pressure 9 10       Pupils      Pupils Dark Light Shape React APD   Right PERRL 2 2 Irregular Minimal None   Left PERRL 1 1 Round Sluggish None       Visual Fields (Counting fingers)      Left Right   Restrictions  Total superior nasal deficiency       Neuro/Psych    Oriented x3: Yes   Mood/Affect: Normal       Dilation    Left eye: 1.0% Mydriacyl, 2.5% Phenylephrine @ 10:10 AM        Slit Lamp and Fundus Exam     External Exam      Right Left   External Normal Normal       Slit Lamp Exam      Right Left   Lids/Lashes Normal Normal   Conjunctiva/Sclera White  and quiet White and quiet   Cornea Clear Clear   Anterior Chamber Deep and quiet Deep and quiet   Iris Round and reactive Round and reactive   Lens  2+ Nuclear sclerosis   Anterior Vitreous Normal Normal       Fundus Exam      Right Left   Posterior Vitreous  Posterior vitreous detachment   Disc Not dilated Normal   C/D Ratio  0.45   Macula  Macular thickening,  No Subretinal hemorrhage   Vessels  Normal,,, no DR   Periphery  Normal          IMAGING AND PROCEDURES  Imaging and Procedures for 08/23/20  OCT, Retina - OS - Left Eye       Quality was good. Scan locations included subfoveal. Central Foveal Thickness: 252. Findings include retinal drusen , abnormal foveal contour, no SRF, intraretinal fluid, cystoid macular edema.   Notes Region superior to the fovea left eye has increased in size at extended 62-monthfollow-up.  Will need repeat injection Avastin today and again in 6 weeks evaluate       Intravitreal Injection, Pharmacologic Agent - OS - Left Eye       Time Out 08/23/2020. 10:26 AM. Confirmed correct patient, procedure, site, and patient consented.   Anesthesia Topical anesthesia was used. Anesthetic medications included Akten 3.5%.   Procedure Preparation included Tobramycin 0.3%. A 30 gauge needle was used.   Injection:  2.5 mg Bevacizumab (AVASTIN) 2.520m0.1mL SOSY   NDC: : 31497-026-37Lot: : [id number] Route: Intravitreal, Site: Left Eye  Post-op Post injection exam found visual acuity of at least counting fingers. The patient tolerated the procedure well. There were no complications. The patient received written and verbal post procedure care education. Post injection medications were not given.                 ASSESSMENT/PLAN:  Age-related nuclear cataract, left Some element of visual  acuity impact yet not limiting  Diabetes mellitus type 2, controlled, without complications (HCC) No DR will observe  Exudative age-related macular degeneration of left eye with active choroidal neovascularization (HCC) Region superior portion of the macula left eye with intraretinal fluid and CME has worsened at 3-18-monthllow-up, missed follow-ups in the past due to a hip fracture injury and recovery.  We will need to repeat injection Avastin OS today and examination again dilate OU in 6 weeks and likely retreat left eye today to restore control of active CNVM superior portion of macula      ICD-10-CM   1. Exudative age-related macular degeneration of left eye with active choroidal neovascularization (HCC)  H35.3221 OCT, Retina - OS - Left Eye    Intravitreal Injection, Pharmacologic Agent - OS - Left Eye    bevacizumab (AVASTIN) SOSY 2.5 mg  2. Age-related nuclear cataract, left  H25.12   3. Controlled type 2 diabetes mellitus without complication, without long-term current use of insulin (HCC)  E11.9     1.  CNVM increase in size and activity superior portion of macula now at 3.5-m38-monthlow-up, will need repeat injection Avastin OS today and examination again in 6 weeks to bring this lesion under control  2.  3.  Ophthalmic Meds Ordered this visit:  Meds ordered this encounter  Medications  . bevacizumab (AVASTIN) SOSY 2.5 mg       Return in about 6 weeks (around 10/04/2020) for DILATE OU, AVASTIN OCT, OS.  There are no Patient Instructions on file for  this visit.   Explained the diagnoses, plan, and follow up with the patient and they expressed understanding.  Patient expressed understanding of the importance of proper follow up care.   Clent Demark Derrik Mceachern M.D. Diseases & Surgery of the Retina and Vitreous [facility] [place] 08/23/20     Abbreviations: M myopia (nearsighted); A astigmatism; H hyperopia (farsighted); P presbyopia; Mrx spectacle prescription;   CTL contact lenses; OD right eye; OS left eye; OU both eyes  XT exotropia; ET esotropia; PEK punctate epithelial keratitis; PEE punctate epithelial erosions; DES dry eye syndrome; MGD meibomian gland dysfunction; ATs artificial tears; PFAT's preservative free artificial tears; [place] nuclear sclerotic cataract; PSC posterior subcapsular cataract; ERM epi-retinal membrane; PVD posterior vitreous detachment; RD retinal detachment; DM diabetes mellitus; DR diabetic retinopathy; NPDR non-proliferative diabetic retinopathy; PDR proliferative diabetic retinopathy; CSME clinically significant macular edema; DME diabetic macular edema; dbh dot blot hemorrhages; CWS cotton wool spot; POAG primary open angle glaucoma; C/D cup-to-disc ratio; HVF humphrey visual field; GVF goldmann visual field; OCT optical coherence tomography; IOP intraocular pressure; BRVO Branch retinal vein occlusion; CRVO central retinal vein occlusion; CRAO central retinal artery occlusion; BRAO branch retinal artery occlusion; RT retinal tear; SB scleral buckle; PPV pars plana vitrectomy; VH Vitreous hemorrhage; PRP panretinal laser photocoagulation; IVK intravitreal kenalog; VMT vitreomacular traction; MH Macular hole;  NVD neovascularization of the disc; NVE neovascularization elsewhere; AREDS age related eye disease study; ARMD age related macular degeneration; POAG primary open angle glaucoma; EBMD epithelial/anterior basement membrane dystrophy; ACIOL anterior chamber intraocular lens; IOL intraocular lens; PCIOL posterior chamber intraocular lens; Phaco/IOL phacoemulsification with intraocular lens placement; [place] photorefractive keratectomy; LASIK laser assisted in situ keratomileusis; HTN hypertension; DM diabetes mellitus; COPD chronic obstructive pulmonary disease

## 2020-08-23 NOTE — Patient Instructions (Signed)
Patient instructed to contact the office promptly new onset visual acuity declines or distortions 

## 2020-08-23 NOTE — Assessment & Plan Note (Signed)
No DR will observe

## 2020-08-23 NOTE — Assessment & Plan Note (Signed)
Region superior portion of the macula left eye with intraretinal fluid and CME has worsened at 67-month follow-up, missed follow-ups in the past due to a hip fracture injury and recovery.  We will need to repeat injection Avastin OS today and examination again dilate OU in 6 weeks and likely retreat left eye today to restore control of active CNVM superior portion of macula

## 2020-10-04 ENCOUNTER — Other Ambulatory Visit: Payer: Self-pay

## 2020-10-04 ENCOUNTER — Ambulatory Visit (INDEPENDENT_AMBULATORY_CARE_PROVIDER_SITE_OTHER): Payer: Medicare Other | Admitting: Ophthalmology

## 2020-10-04 ENCOUNTER — Encounter (INDEPENDENT_AMBULATORY_CARE_PROVIDER_SITE_OTHER): Payer: Self-pay | Admitting: Ophthalmology

## 2020-10-04 DIAGNOSIS — H353212 Exudative age-related macular degeneration, right eye, with inactive choroidal neovascularization: Secondary | ICD-10-CM

## 2020-10-04 DIAGNOSIS — H2512 Age-related nuclear cataract, left eye: Secondary | ICD-10-CM

## 2020-10-04 DIAGNOSIS — H353114 Nonexudative age-related macular degeneration, right eye, advanced atrophic with subfoveal involvement: Secondary | ICD-10-CM

## 2020-10-04 DIAGNOSIS — H353221 Exudative age-related macular degeneration, left eye, with active choroidal neovascularization: Secondary | ICD-10-CM | POA: Diagnosis not present

## 2020-10-04 MED ORDER — BEVACIZUMAB 2.5 MG/0.1ML IZ SOSY
2.5000 mg | PREFILLED_SYRINGE | INTRAVITREAL | Status: AC | PRN
  Administered 2020-10-04: 2.5 mg via INTRAVITREAL

## 2020-10-04 NOTE — Assessment & Plan Note (Signed)
Stable over time. 

## 2020-10-04 NOTE — Assessment & Plan Note (Signed)
The nature of dry age related macular degeneration was discussed with the patient as well as its possible conversion to wet. The results of the AREDS 2 study was discussed with the patient. A diet rich in dark leafy green vegetables was advised and specific recommendations were made regarding supplements with AREDS 2 formulation . Control of hypertension and serum cholesterol may slow the disease. Smoking cessation is mandatory to slow the disease and diminish the risk of progressing to wet age related macular degeneration. The patient was instructed in the use of an [place] Grid and was told to return immediately for any changes in the Grid. Stressed to the patient do not rub eyes  Center involved atrophy, not progressing

## 2020-10-04 NOTE — Assessment & Plan Note (Signed)
The nature of wet macular degeneration was discussed with the patient.  Forms of therapy reviewed include the use of Anti-VEGF medications injected painlessly into the eye, as well as other possible treatment modalities, including thermal laser therapy. Fellow eye involvement and risks were discussed with the patient. Upon the finding of wet age related macular degeneration, treatment will be offered. The treatment regimen is on a treat as needed basis with the intent to treat if necessary and extend interval of exams when possible. On average 1 out of 6 patients do not need lifetime therapy. However, the risk of recurrent disease is high for a lifetime.  Initially monthly, then periodic, examinations and evaluations will determine whether the next treatment is required on the day of the examination.  OS with noncentral involved CNVM superior portion of macula still active at 6 weeks repeat injection today Avastin

## 2020-10-04 NOTE — Progress Notes (Addendum)
10/04/2020     CHIEF COMPLAINT Patient presents for Retina Follow Up (6 Wk F/U OU, poss Avastin OS//Pt denies noticeable changes to [place] OU since last visit. Pt denies ocular pain, flashes of light, or floaters OU. //)   HISTORY OF PRESENT ILLNESS: [name] is a 70 y.o. male who presents to the clinic today for:   HPI    Retina Follow Up    Patient presents with  Wet AMD.  In left eye.  This started 6 weeks ago.  Severity is mild.  Duration of 6 weeks.  Since onset it is stable. Additional comments: 6 Wk F/U OU, poss Avastin OS  Pt denies noticeable changes to [place] OU since last visit. Pt denies ocular pain, flashes of light, or floaters OU.          Last edited by Rockie Neighbours, [place] on 10/04/2020  9:54 AM. (History)      Referring physician: Care, Jinny Blossom [facility] [address],  [place] [address]  HISTORICAL INFORMATION:   Selected notes from the MEDICAL RECORD NUMBER    Lab Results  Component Value Date   HGBA1C 7.8 (H) 06/14/2020     CURRENT MEDICATIONS: No current outpatient medications on file. (Ophthalmic Drugs)   No current facility-administered medications for this visit. (Ophthalmic Drugs)   Current Outpatient Medications (Other)  Medication Sig  . Accu-Chek FastClix Lancets MISC USE UTD  . ACCU-CHEK GUIDE test strip U TO TEST BLOOD SUGAR QD  . atorvastatin (LIPITOR) 40 MG tablet Take 1 tablet (40 mg total) by mouth every evening. (Patient taking differently: Take 40 mg by mouth every evening.)  . Blood Glucose Monitoring Suppl (ACCU-CHEK GUIDE) w/Device KIT U TO TEST BLOOD SUGAR QD  . carvedilol (COREG) 12.5 MG tablet TAKE ONE TABLET BY MOUTH TWICE A DAY (Patient taking differently: Take 12.5 mg by mouth 2 (two) times daily with a meal.)  . Dextromethorphan HBr (ROBITUSSIN LINGERING COUGHGELS) 15 MG CAPS Take 15 mg by mouth daily as needed (cough).   . famotidine (PEPCID) 20 MG tablet Take 20 mg by mouth at bedtime.  .  fenofibrate (TRICOR) 48 MG tablet Take 48 mg by mouth daily.  . finasteride (PROSCAR) 5 MG tablet Take 5 mg by mouth daily.  Marland Kitchen gabapentin (NEURONTIN) 400 MG capsule Take 400 mg by mouth 2 (two) times daily.  Marland Kitchen HYDROcodone-acetaminophen (NORCO/VICODIN) 5-325 MG tablet Take 1-2 tablets by mouth every 6 (six) hours as needed for moderate pain.  . metFORMIN (GLUCOPHAGE) 1000 MG tablet Take 0.5 tablets (500 mg total) by mouth 2 (two) times daily with a meal.  . montelukast (SINGULAIR) 10 MG tablet Take 10 mg by mouth daily.   . Multiple Vitamin (MULTIVITAMIN) tablet Take 1 tablet by mouth daily.  . pantoprazole (PROTONIX) 40 MG tablet Take 1 tablet (40 mg total) by mouth daily.  . potassium chloride SA (KLOR-CON) 20 MEQ tablet Take 20 mEq by mouth daily.   . SYMBICORT 80-4.5 MCG/ACT inhaler Inhale 2 puffs into the lungs daily as needed (wheezing).  . tamsulosin (FLOMAX) 0.4 MG CAPS capsule Take 0.4 mg by mouth daily.  Marland Kitchen torsemide (DEMADEX) 20 MG tablet Take 2 tablets (40 mg total) by mouth in the morning AND 1 tablet (20 mg total) every evening.  . VENTOLIN HFA 108 (90 Base) MCG/ACT inhaler Inhale 2 puffs into the lungs every 6 (six) hours as needed for wheezing or shortness of breath.    No current  facility-administered medications for this visit. (Other)      REVIEW OF SYSTEMS:    ALLERGIES Allergies  Allergen Reactions  . Cashew Nut Oil Palpitations  . Percocet [Oxycodone-Acetaminophen] Other (See Comments)    Hallucintaions    PAST MEDICAL HISTORY Past Medical History:  Diagnosis Date  . Blockage of coronary artery of heart ([place]) 10/17/2011   wife states blocked 30%  . CHF (congestive heart failure) ([place])   . Chronic kidney disease   . Diabetes mellitus 10/17/2011   newly dx today  . Edema leg    right leg has leaky valve and right foot swells  . Emphysema   . Excessive ear wax   . Fatty liver   . Hernia    near navel  . Hyperlipidemia   . Hypertension   . Leaky heart  valve   . Neuropathy   . Rheumatic fever   . Sleep apnea   . Slow urinary stream   . TIA (transient ischemic attack)    Past Surgical History:  Procedure Laterality Date  . CIRCUMCISION    . LITHOTRIPSY    . PRESSURE SENSOR/CARDIOMEMS N/A 05/27/2019   Procedure: PRESSURE SENSOR/CARDIOMEMS;  Surgeon: Larey Dresser, MD;  Location: [facility];  Service: Cardiovascular;  Laterality: N/A;  . RIGHT HEART CATH N/A 05/27/2019   Procedure: RIGHT HEART CATH;  Surgeon: Larey Dresser, MD;  Location: [facility];  Service: Cardiovascular;  Laterality: N/A;  . TOTAL HIP ARTHROPLASTY Left 06/16/2020   Procedure: TOTAL HIP ARTHROPLASTY ANTERIOR APPROACH;  Surgeon: Rod Can, MD;  Location: [facility];  Service: Orthopedics;  Laterality: Left;    FAMILY HISTORY Family History  Problem Relation Age of Onset  . Emphysema Mother   . Aneurysm Mother   . Emphysema Father   . Coronary artery disease Brother   . Coronary artery disease Brother   . Coronary artery disease Sister     SOCIAL HISTORY Social History   Tobacco Use  . Smoking status: Never Smoker  . Smokeless tobacco: Never Used  Vaping Use  . Vaping Use: Never used  Substance Use Topics  . Alcohol use: No  . Drug use: No         OPHTHALMIC EXAM:  Base Eye Exam    Visual Acuity (ETDRS)      Right Left   Dist [place] CF at 3' 20/25 +2   Dist ph Bivalve NI        Tonometry (Tonopen, 9:54 AM)      Right Left   Pressure 12 10       Pupils      Dark Light Shape React APD   Right 2 2 Irregular Minimal None   Left 1 1 Round Minimal None       Visual Fields (Counting fingers)      Left Right    Full    Restrictions  Total superior nasal deficiency       Extraocular Movement      Right Left    Full Full       Neuro/Psych    Oriented x3: Yes   Mood/Affect: Normal       Dilation    Both eyes: 1.0% Mydriacyl, 2.5% Phenylephrine @ 9:57 AM        Slit Lamp and Fundus Exam    External Exam       Right Left   External Normal Normal       Slit Lamp Exam  Right Left   Lids/Lashes Normal Normal   Conjunctiva/Sclera White and quiet White and quiet   Cornea Clear Clear   Anterior Chamber Deep and quiet Deep and quiet   Iris Round and reactive Round and reactive   Lens  2+ Nuclear sclerosis   Anterior Vitreous Normal Normal       Fundus Exam      Right Left   Posterior Vitreous Posterior vitreous detachment Posterior vitreous detachment   Disc Peripapillary atrophy Normal   C/D Ratio 0.45 0.45   Macula Geographic atrophy into the fovea Macular thickening,  No Subretinal hemorrhage   Vessels Normal Normal,,, no DR   Periphery Normal Normal          IMAGING AND PROCEDURES  Imaging and Procedures for 10/04/20  OCT, Retina - OU - Both Eyes       Right Eye Quality was borderline. Scan locations included subfoveal. Central Foveal Thickness: 327. Progression has been stable. Findings include intraretinal fluid, cystoid macular edema, choroidal neovascular membrane, subretinal scarring.   Left Eye Quality was good. Scan locations included subfoveal. Central Foveal Thickness: 261. Findings include abnormal foveal contour, no IRF, no SRF, subretinal scarring, cystoid macular edema.   Notes OS now improved status post intravitreal Avastin for subretinal fluid CNVM recurrence in the superior portion of the macula, not threatening fovea, at 6-week interval.  We will repeat injection today and examination again in 6 weeks  OD with chronic disciform scar, not treatable       Intravitreal Injection, Pharmacologic Agent - OS - Left Eye       Time Out 10/04/2020. 10:24 AM. Confirmed correct patient, procedure, site, and patient consented.   Anesthesia Topical anesthesia was used. Anesthetic medications included Akten 3.5%.   Procedure Preparation included Tobramycin 0.3%. A 30 gauge needle was used.   Injection:  2.5 mg Bevacizumab (AVASTIN) 2.5mg /0.14mL SOSY   NDC:  54008-676-19   Route: Intravitreal, Site: Left Eye  Post-op Post injection exam found visual acuity of at least counting fingers. The patient tolerated the procedure well. There were no complications. The patient received written and verbal post procedure care education. Post injection medications were not given.                 ASSESSMENT/PLAN:  Exudative age-related macular degeneration of left eye with active choroidal neovascularization (HCC) The nature of wet macular degeneration was discussed with the patient.  Forms of therapy reviewed include the use of Anti-VEGF medications injected painlessly into the eye, as well as other possible treatment modalities, including thermal laser therapy. Fellow eye involvement and risks were discussed with the patient. Upon the finding of wet age related macular degeneration, treatment will be offered. The treatment regimen is on a treat as needed basis with the intent to treat if necessary and extend interval of exams when possible. On average 1 out of 6 patients do not need lifetime therapy. However, the risk of recurrent disease is high for a lifetime.  Initially monthly, then periodic, examinations and evaluations will determine whether the next treatment is required on the day of the examination.  OS with noncentral involved CNVM superior portion of macula still active at 6 weeks repeat injection today Avastin  Exudative age-related macular degeneration of right eye with inactive choroidal neovascularization (HCC) Disciform scar, geographic atrophy no active disease OD  Advanced nonexudative age-related macular degeneration of right eye with subfoveal involvement The nature of dry age related macular degeneration was discussed with the patient as  well as its possible conversion to wet. The results of the AREDS 2 study was discussed with the patient. A diet rich in dark leafy green vegetables was advised and specific recommendations were made  regarding supplements with AREDS 2 formulation . Control of hypertension and serum cholesterol may slow the disease. Smoking cessation is mandatory to slow the disease and diminish the risk of progressing to wet age related macular degeneration. The patient was instructed in the use of an [place] and was told to return immediately for any changes in the Grid. Stressed to the patient do not rub eyes  Center involved atrophy, not progressing  Age-related nuclear cataract, left Stable over time      ICD-10-CM   1. Exudative age-related macular degeneration of left eye with active choroidal neovascularization (HCC)  H35.3221 OCT, Retina - OU - Both Eyes    Intravitreal Injection, Pharmacologic Agent - OS - Left Eye    bevacizumab (AVASTIN) SOSY 2.5 mg  2. Exudative age-related macular degeneration of right eye with inactive choroidal neovascularization ([place])  H35.3212   3. 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 Age-related nuclear cataract, left  H25.12     1.  OD with chronic scarring, not active, counts are acuity with atrophy and previous disciform scar observe    2.  OS, chronic active lesion superior portion of macula improved overall at 6-week follow-up post Avastin.  Repeat injection today  3.  Ophthalmic Meds Ordered this visit:  Meds ordered this encounter  Medications  . bevacizumab (AVASTIN) SOSY 2.5 mg       Return in about 8 weeks (around 11/29/2020) for dilate, OS, AVASTIN OCT.  There are no Patient Instructions on file for this visit.   Explained the diagnoses, plan, and follow up with the patient and they expressed understanding.  Patient expressed understanding of the importance of proper follow up care.   Clent Demark Retha Bither M.D. Diseases & Surgery of the Retina and Vitreous [facility] [place] 10/04/20     Abbreviations: M myopia (nearsighted); A astigmatism; H hyperopia  (farsighted); P presbyopia; Mrx spectacle prescription;  CTL contact lenses; OD right eye; OS left eye; OU both eyes  XT exotropia; ET esotropia; PEK punctate epithelial keratitis; PEE punctate epithelial erosions; DES dry eye syndrome; MGD meibomian gland dysfunction; ATs artificial tears; PFAT's preservative free artificial tears; [place] nuclear sclerotic cataract; PSC posterior subcapsular cataract; ERM epi-retinal membrane; PVD posterior vitreous detachment; RD retinal detachment; DM diabetes mellitus; DR diabetic retinopathy; NPDR non-proliferative diabetic retinopathy; PDR proliferative diabetic retinopathy; CSME clinically significant macular edema; DME diabetic macular edema; dbh dot blot hemorrhages; CWS cotton wool spot; POAG primary open angle glaucoma; C/D cup-to-disc ratio; HVF humphrey visual field; GVF goldmann visual field; OCT optical coherence tomography; IOP intraocular pressure; BRVO Branch retinal vein occlusion; CRVO central retinal vein occlusion; CRAO central retinal artery occlusion; BRAO branch retinal artery occlusion; RT retinal tear; SB scleral buckle; PPV pars plana vitrectomy; VH Vitreous hemorrhage; PRP panretinal laser photocoagulation; IVK intravitreal kenalog; VMT vitreomacular traction; MH Macular hole;  NVD neovascularization of the disc; NVE neovascularization elsewhere; AREDS age related eye disease study; ARMD age related macular degeneration; POAG primary open angle glaucoma; EBMD epithelial/anterior basement membrane dystrophy; ACIOL anterior chamber intraocular lens; IOL intraocular lens; PCIOL posterior chamber intraocular lens; Phaco/IOL phacoemulsification with intraocular lens placement; [place] photorefractive keratectomy; LASIK laser assisted in situ keratomileusis; HTN hypertension; DM diabetes mellitus; COPD chronic obstructive pulmonary disease

## 2020-10-04 NOTE — Assessment & Plan Note (Signed)
Disciform scar, geographic atrophy no active disease OD

## 2020-10-07 ENCOUNTER — Other Ambulatory Visit: Payer: Self-pay

## 2020-10-07 ENCOUNTER — Encounter: Payer: Self-pay | Admitting: Podiatry

## 2020-10-07 ENCOUNTER — Ambulatory Visit (INDEPENDENT_AMBULATORY_CARE_PROVIDER_SITE_OTHER): Payer: Medicare Other | Admitting: Podiatry

## 2020-10-07 DIAGNOSIS — B351 Tinea unguium: Secondary | ICD-10-CM

## 2020-10-07 DIAGNOSIS — M79675 Pain in left toe(s): Secondary | ICD-10-CM | POA: Diagnosis not present

## 2020-10-07 DIAGNOSIS — E119 Type 2 diabetes mellitus without complications: Secondary | ICD-10-CM | POA: Diagnosis not present

## 2020-10-07 DIAGNOSIS — I872 Venous insufficiency (chronic) (peripheral): Secondary | ICD-10-CM | POA: Diagnosis not present

## 2020-10-07 DIAGNOSIS — M79674 Pain in right toe(s): Secondary | ICD-10-CM

## 2020-10-07 NOTE — Progress Notes (Signed)
This patient returns to my office for at risk foot care.  This patient requires this care by a professional since this patient will be at risk due to having diabetes type 2 and chronic venous insufficiency.  This patient is unable to cut nails himself since the patient cannot reach his nails.These nails are painful walking and wearing shoes.  This patient presents for at risk foot care today.  General Appearance  Alert, conversant and in no acute stress.  Vascular  Dorsalis pedis and posterior tibial  pulses are not  palpable  bilaterally.  Capillary return is within normal limits  bilaterally. Temperature is within normal limits  bilaterally.  Neurologic  Senn-Weinstein monofilament wire test diminished/absent  bilaterally. Muscle power within normal limits bilaterally.  Nails Thick disfigured discolored nails with subungual debris  from hallux to fifth toes bilaterally. No evidence of bacterial infection or drainage bilaterally.  Orthopedic  No limitations of motion  feet .  No crepitus or effusions noted.  No bony pathology or digital deformities noted.  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  Midfoot  DJD  B/L.  Limited rearfoot  ROM  B/L.  Skin  normotropic skin with no porokeratosis noted bilaterally.  No signs of infections or ulcers noted.     Onychomycosis  Pain in right toes  Pain in left toes  Consent was obtained for treatment procedures.   Mechanical debridement of nails 1-5  bilaterally performed with a nail nipper.  Filed with dremel without incident.    Return office visit    3 months                  Told patient to return for periodic foot care and evaluation due to potential at risk complications.   Jeanice Dempsey DPM  

## 2020-10-17 ENCOUNTER — Other Ambulatory Visit (HOSPITAL_COMMUNITY): Payer: Self-pay | Admitting: Cardiology

## 2020-10-25 ENCOUNTER — Telehealth (HOSPITAL_COMMUNITY): Payer: Self-pay | Admitting: Adult Health

## 2020-10-25 NOTE — Telephone Encounter (Signed)
Cardiomems Goal   Todays reading is 25.  Left message to take an extra 20 mg of torsemide. Will continue to monitor   Laquisha Northcraft NP-C  12:06 PM

## 2020-11-20 ENCOUNTER — Other Ambulatory Visit (HOSPITAL_COMMUNITY): Payer: Self-pay | Admitting: Cardiology

## 2020-11-29 ENCOUNTER — Encounter (INDEPENDENT_AMBULATORY_CARE_PROVIDER_SITE_OTHER): Payer: Medicare Other | Admitting: Ophthalmology

## 2020-11-29 ENCOUNTER — Other Ambulatory Visit (HOSPITAL_COMMUNITY): Payer: Self-pay | Admitting: Cardiology

## 2020-12-05 ENCOUNTER — Ambulatory Visit (INDEPENDENT_AMBULATORY_CARE_PROVIDER_SITE_OTHER): Payer: Medicare Other | Admitting: Ophthalmology

## 2020-12-05 ENCOUNTER — Other Ambulatory Visit: Payer: Self-pay

## 2020-12-05 ENCOUNTER — Encounter (INDEPENDENT_AMBULATORY_CARE_PROVIDER_SITE_OTHER): Payer: Self-pay | Admitting: Ophthalmology

## 2020-12-05 DIAGNOSIS — H353212 Exudative age-related macular degeneration, right eye, with inactive choroidal neovascularization: Secondary | ICD-10-CM

## 2020-12-05 DIAGNOSIS — H353114 Nonexudative age-related macular degeneration, right eye, advanced atrophic with subfoveal involvement: Secondary | ICD-10-CM | POA: Diagnosis not present

## 2020-12-05 DIAGNOSIS — H353221 Exudative age-related macular degeneration, left eye, with active choroidal neovascularization: Secondary | ICD-10-CM | POA: Diagnosis not present

## 2020-12-05 MED ORDER — BEVACIZUMAB 2.5 MG/0.1ML IZ SOSY
2.5000 mg | PREFILLED_SYRINGE | INTRAVITREAL | Status: AC | PRN
  Administered 2020-12-05: 2.5 mg via INTRAVITREAL

## 2020-12-05 NOTE — Assessment & Plan Note (Signed)
Chronic active lesion superonasal to the fovea currently stabilized still at 9-week interval we will repeat injection today and this monocular patient yet maintain 9-week interval

## 2020-12-05 NOTE — Progress Notes (Signed)
12/05/2020     CHIEF COMPLAINT Patient presents for Retina Follow Up (9 Wk F/U OS, poss Avastin OS//Pt c/o blurry VA OS when reading or trying to read a clock. No other new symptoms OU./LBS: does not check)   HISTORY OF PRESENT ILLNESS: [name] is a 70 y.o. male who presents to the clinic today for:   HPI    Retina Follow Up    Diagnosis: Wet AMD   Laterality: left eye   Onset: 9 weeks ago   Severity: mild   Duration: 9 weeks   Course: gradually worsening   Comments: 9 Wk F/U OS, poss Avastin OS  Pt c/o blurry VA OS when reading or trying to read a clock. No other new symptoms OU. LBS: does not check       Last edited by Rockie Neighbours, [place] on 12/05/2020 10:38 AM. (History)      Referring physician: Care, Jinny Blossom [address],  [place] [address]  HISTORICAL INFORMATION:   Selected notes from the MEDICAL RECORD NUMBER    Lab Results  Component Value Date   HGBA1C 7.8 (H) 06/14/2020     CURRENT MEDICATIONS: No current outpatient medications on file. (Ophthalmic Drugs)   No current facility-administered medications for this visit. (Ophthalmic Drugs)   Current Outpatient Medications (Other)  Medication Sig  . Accu-Chek FastClix Lancets MISC USE UTD  . ACCU-CHEK GUIDE test strip U TO TEST BLOOD SUGAR QD  . atorvastatin (LIPITOR) 40 MG tablet Take 1 tablet (40 mg total) by mouth every evening. (Patient taking differently: Take 40 mg by mouth every evening.)  . Blood Glucose Monitoring Suppl (ACCU-CHEK GUIDE) w/Device KIT U TO TEST BLOOD SUGAR QD  . carvedilol (COREG) 12.5 MG tablet TAKE ONE TABLET BY MOUTH TWICE A DAY (Patient taking differently: Take 12.5 mg by mouth 2 (two) times daily with a meal.)  . Dextromethorphan HBr (ROBITUSSIN LINGERING COUGHGELS) 15 MG CAPS Take 15 mg by mouth daily as needed (cough).   . famotidine (PEPCID) 20 MG tablet Take 20 mg by mouth at bedtime.  . fenofibrate (TRICOR) 48 MG tablet  Take 48 mg by mouth daily.  . finasteride (PROSCAR) 5 MG tablet Take 5 mg by mouth daily.  Marland Kitchen gabapentin (NEURONTIN) 400 MG capsule Take 400 mg by mouth 2 (two) times daily.  Marland Kitchen HYDROcodone-acetaminophen (NORCO/VICODIN) 5-325 MG tablet Take 1-2 tablets by mouth every 6 (six) hours as needed for moderate pain.  . metFORMIN (GLUCOPHAGE) 1000 MG tablet Take 0.5 tablets (500 mg total) by mouth 2 (two) times daily with a meal.  . montelukast (SINGULAIR) 10 MG tablet Take 10 mg by mouth daily.   . Multiple Vitamin (MULTIVITAMIN) tablet Take 1 tablet by mouth daily.  . pantoprazole (PROTONIX) 40 MG tablet Take 1 tablet (40 mg total) by mouth daily.  . potassium chloride SA (KLOR-CON) 20 MEQ tablet Take 20 mEq by mouth daily.   . SYMBICORT 80-4.5 MCG/ACT inhaler Inhale 2 puffs into the lungs daily as needed (wheezing).  . tamsulosin (FLOMAX) 0.4 MG CAPS capsule Take 0.4 mg by mouth daily.  Marland Kitchen torsemide (DEMADEX) 20 MG tablet TAKE TWO TABLETS BY MOUTH EVERY MORNING AND 1 TABLET IN THE EVENING  . VENTOLIN HFA 108 (90 Base) MCG/ACT inhaler Inhale 2 puffs into the lungs every 6 (six) hours as needed for wheezing or shortness of breath.    No current facility-administered medications for this visit. (Other)  REVIEW OF SYSTEMS:    ALLERGIES Allergies  Allergen Reactions  . Cashew Nut Oil Palpitations  . Percocet [Oxycodone-Acetaminophen] Other (See Comments)    Hallucintaions    PAST MEDICAL HISTORY Past Medical History:  Diagnosis Date  . Blockage of coronary artery of heart ([place]) 10/17/2011   wife states blocked 30%  . CHF (congestive heart failure) ([place])   . Chronic kidney disease   . Diabetes mellitus 10/17/2011   newly dx today  . Edema leg    right leg has leaky valve and right foot swells  . Emphysema   . Excessive ear wax   . Fatty liver   . Hernia    near navel  . Hyperlipidemia   . Hypertension   . Leaky heart valve   . Neuropathy   . Rheumatic fever   . Sleep apnea    . Slow urinary stream   . TIA (transient ischemic attack)    Past Surgical History:  Procedure Laterality Date  . CIRCUMCISION    . LITHOTRIPSY    . PRESSURE SENSOR/CARDIOMEMS N/A 05/27/2019   Procedure: PRESSURE SENSOR/CARDIOMEMS;  Surgeon: Larey Dresser, MD;  Location: [facility];  Service: Cardiovascular;  Laterality: N/A;  . RIGHT HEART CATH N/A 05/27/2019   Procedure: RIGHT HEART CATH;  Surgeon: Larey Dresser, MD;  Location: [facility];  Service: Cardiovascular;  Laterality: N/A;  . TOTAL HIP ARTHROPLASTY Left 06/16/2020   Procedure: TOTAL HIP ARTHROPLASTY ANTERIOR APPROACH;  Surgeon: Rod Can, MD;  Location: [facility];  Service: Orthopedics;  Laterality: Left;    FAMILY HISTORY Family History  Problem Relation Age of Onset  . Emphysema Mother   . Aneurysm Mother   . Emphysema Father   . Coronary artery disease Brother   . Coronary artery disease Brother   . Coronary artery disease Sister     SOCIAL HISTORY Social History   Tobacco Use  . Smoking status: Never Smoker  . Smokeless tobacco: Never Used  Vaping Use  . Vaping Use: Never used  Substance Use Topics  . Alcohol use: No  . Drug use: No         OPHTHALMIC EXAM: Base Eye Exam    Visual Acuity (ETDRS)      Right Left   Dist [place] CF at 3' 20/30 +1   Dist ph [place] NI NI       Tonometry (Tonopen, 10:41 AM)      Right Left   Pressure 13 13       Pupils      Dark Light Shape React APD   Right 3 3 Irregular Minimal None   Left 2 2 Round Minimal None       Visual Fields (Counting fingers)      Left Right    Full    Restrictions  Total superior nasal deficiency       Extraocular Movement      Right Left    Full Full       Neuro/Psych    Oriented x3: Yes   Mood/Affect: Normal       Dilation    Left eye: 1.0% Mydriacyl, 2.5% Phenylephrine @ 10:41 AM        Slit Lamp and Fundus Exam    External Exam      Right Left   External Normal Normal       Slit Lamp  Exam      Right Left   Lids/Lashes Normal Normal  Conjunctiva/Sclera White and quiet White and quiet   Cornea Clear Clear   Anterior Chamber Deep and quiet Deep and quiet   Iris Round and reactive Round and reactive   Lens  2+ Nuclear sclerosis   Anterior Vitreous Normal Normal       Fundus Exam      Right Left   Posterior Vitreous  Posterior vitreous detachment   Disc  Normal   C/D Ratio  0.45   Macula  Macular thickening,  No Subretinal hemorrhage   Vessels  Normal,,, no DR   Periphery  Normal          IMAGING AND PROCEDURES  Imaging and Procedures for 12/05/20  OCT, Retina - OU - Both Eyes       Right Eye Quality was borderline. Scan locations included subfoveal. Central Foveal Thickness: 317. Progression has been stable. Findings include intraretinal fluid, cystoid macular edema, choroidal neovascular membrane, subretinal scarring.   Left Eye Quality was good. Scan locations included subfoveal. Central Foveal Thickness: 264. Findings include abnormal foveal contour, no IRF, no SRF, subretinal scarring, cystoid macular edema.   Notes OS now improved status post intravitreal Avastin for subretinal fluid CNVM recurrence in the superior portion of the macula, not threatening fovea, at 9-week interval.  We will repeat injection today and examination again in 6 weeks  OD with chronic disciform scar, not treatable       Intravitreal Injection, Pharmacologic Agent - OS - Left Eye       Time Out 12/05/2020. 10:58 AM. Confirmed correct patient, procedure, site, and patient consented.   Anesthesia Topical anesthesia was used. Anesthetic medications included Akten 3.5%.   Procedure Preparation included Tobramycin 0.3%. A 30 gauge needle was used.   Injection:  2.5 mg Bevacizumab (AVASTIN) 2.5mg /0.42mL SOSY   NDC: 69485-462-70, Lot: [id number]   Route: Intravitreal, Site: Left Eye  Post-op Post injection exam found visual acuity of at least counting fingers. The  patient tolerated the procedure well. There were no complications. The patient received written and verbal post procedure care education. Post injection medications were not given.                 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 Exudative age-related macular degeneration of left eye with active choroidal neovascularization (HCC)  H35.3221 OCT, Retina - OU - Both Eyes    Intravitreal Injection, Pharmacologic Agent - OS - Left Eye    bevacizumab (AVASTIN) SOSY 2.5 mg  2. Advanced nonexudative age-related macular degeneration of right eye with subfoveal involvement  H35.3114   3. Exudative age-related macular degeneration of right eye with inactive choroidal neovascularization ([place])  H35.3212     1.  At 9-week interval today and this monocular patient, repeat injection intravitreal Avastin to control CNVM superonasal to the foveal avascular region OS.  2.  In this monocular patient will maintain 9-week follow-up interval dilate OU next possible Avastin OS  3.  Ophthalmic Meds Ordered this visit:  Meds ordered this encounter  Medications  . bevacizumab (  AVASTIN) SOSY 2.5 mg       Return in about 9 weeks (around 02/06/2021) for DILATE OU, AVASTIN OCT, OS.  There are no Patient Instructions on file for this visit.   Explained the diagnoses, plan, and follow up with the patient and they expressed understanding.  Patient  expressed understanding of the importance of proper follow up care.   Clent Demark Hawa Henly M.D. Diseases & Surgery of the Retina and Vitreous [facility] [place] 12/05/20     Abbreviations: M myopia (nearsighted); A astigmatism; H hyperopia (farsighted); P presbyopia; Mrx spectacle prescription;  CTL contact lenses; OD right eye; OS left eye; OU both eyes  XT exotropia; ET esotropia; PEK punctate epithelial keratitis; PEE punctate epithelial erosions; DES dry eye syndrome; MGD meibomian gland dysfunction; ATs artificial tears; PFAT's preservative free artificial tears; [place] nuclear sclerotic cataract; PSC posterior subcapsular cataract; ERM epi-retinal membrane; PVD posterior vitreous detachment; RD retinal detachment; DM diabetes mellitus; DR diabetic retinopathy; NPDR non-proliferative diabetic retinopathy; PDR proliferative diabetic retinopathy; CSME clinically significant macular edema; DME diabetic macular edema; dbh dot blot hemorrhages; CWS cotton wool spot; POAG primary open angle glaucoma; C/D cup-to-disc ratio; HVF humphrey visual field; GVF goldmann visual field; OCT optical coherence tomography; IOP intraocular pressure; BRVO Branch retinal vein occlusion; CRVO central retinal vein occlusion; CRAO central retinal artery occlusion; BRAO branch retinal artery occlusion; RT retinal tear; SB scleral buckle; PPV pars plana vitrectomy; VH Vitreous hemorrhage; PRP panretinal laser photocoagulation; IVK intravitreal kenalog; VMT vitreomacular traction; MH Macular hole;  NVD neovascularization of the disc; NVE neovascularization elsewhere; AREDS age related eye disease study; ARMD age related macular degeneration; POAG primary open angle glaucoma; EBMD epithelial/anterior basement membrane dystrophy; ACIOL anterior chamber intraocular lens; IOL intraocular lens; PCIOL posterior chamber intraocular lens; Phaco/IOL phacoemulsification with intraocular lens placement; [place] photorefractive keratectomy;  LASIK laser assisted in situ keratomileusis; HTN hypertension; DM diabetes mellitus; COPD chronic obstructive pulmonary disease

## 2020-12-05 NOTE — Assessment & Plan Note (Signed)
Atrophy and prior disciform scar account for acuity OD

## 2020-12-05 NOTE — Assessment & Plan Note (Signed)
No signs of recurrence 

## 2021-01-06 ENCOUNTER — Encounter: Payer: Self-pay | Admitting: Podiatry

## 2021-01-06 ENCOUNTER — Ambulatory Visit (INDEPENDENT_AMBULATORY_CARE_PROVIDER_SITE_OTHER): Payer: Medicare Other | Admitting: Podiatry

## 2021-01-06 ENCOUNTER — Other Ambulatory Visit: Payer: Self-pay

## 2021-01-06 DIAGNOSIS — B351 Tinea unguium: Secondary | ICD-10-CM | POA: Diagnosis not present

## 2021-01-06 DIAGNOSIS — M205X2 Other deformities of toe(s) (acquired), left foot: Secondary | ICD-10-CM

## 2021-01-06 DIAGNOSIS — I872 Venous insufficiency (chronic) (peripheral): Secondary | ICD-10-CM

## 2021-01-06 DIAGNOSIS — M79674 Pain in right toe(s): Secondary | ICD-10-CM

## 2021-01-06 DIAGNOSIS — M205X1 Other deformities of toe(s) (acquired), right foot: Secondary | ICD-10-CM

## 2021-01-06 DIAGNOSIS — M79675 Pain in left toe(s): Secondary | ICD-10-CM

## 2021-01-06 DIAGNOSIS — E119 Type 2 diabetes mellitus without complications: Secondary | ICD-10-CM | POA: Diagnosis not present

## 2021-01-06 NOTE — Progress Notes (Signed)
This patient returns to my office for at risk foot care.  This patient requires this care by a professional since this patient will be at risk due to having diabetes type 2 and chronic venous insufficiency.  This patient is unable to cut nails himself since the patient cannot reach his nails.These nails are painful walking and wearing shoes.  This patient presents for at risk foot care today.  General Appearance  Alert, conversant and in no acute stress.  Vascular  Dorsalis pedis and posterior tibial  pulses are not  palpable  bilaterally.  Capillary return is within normal limits  bilaterally. Temperature is within normal limits  bilaterally.  Neurologic  Senn-Weinstein monofilament wire test diminished/absent  bilaterally. Muscle power within normal limits bilaterally.  Nails Thick disfigured discolored nails with subungual debris  from hallux to fifth toes bilaterally. No evidence of bacterial infection or drainage bilaterally.  Orthopedic  No limitations of motion  feet .  No crepitus or effusions noted.  No bony pathology or digital deformities noted.  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  Midfoot  DJD  B/L.  Limited rearfoot  ROM  B/L.  Skin  normotropic skin with no porokeratosis noted bilaterally.  No signs of infections or ulcers noted.     Onychomycosis  Pain in right toes  Pain in left toes  Consent was obtained for treatment procedures.   Mechanical debridement of nails 1-5  bilaterally performed with a nail nipper.  Filed with dremel without incident.    Return office visit    3 months                  Told patient to return for periodic foot care and evaluation due to potential at risk complications.   Rily Nickey DPM  

## 2021-02-06 ENCOUNTER — Encounter (INDEPENDENT_AMBULATORY_CARE_PROVIDER_SITE_OTHER): Payer: Medicare Other | Admitting: Ophthalmology

## 2021-02-07 ENCOUNTER — Ambulatory Visit (INDEPENDENT_AMBULATORY_CARE_PROVIDER_SITE_OTHER): Payer: Medicare Other | Admitting: Ophthalmology

## 2021-02-07 ENCOUNTER — Other Ambulatory Visit: Payer: Self-pay

## 2021-02-07 DIAGNOSIS — H353114 Nonexudative age-related macular degeneration, right eye, advanced atrophic with subfoveal involvement: Secondary | ICD-10-CM | POA: Diagnosis not present

## 2021-02-07 DIAGNOSIS — H353212 Exudative age-related macular degeneration, right eye, with inactive choroidal neovascularization: Secondary | ICD-10-CM

## 2021-02-07 DIAGNOSIS — H353221 Exudative age-related macular degeneration, left eye, with active choroidal neovascularization: Secondary | ICD-10-CM

## 2021-02-07 MED ORDER — BEVACIZUMAB 2.5 MG/0.1ML IZ SOSY
2.5000 mg | PREFILLED_SYRINGE | INTRAVITREAL | Status: AC | PRN
  Administered 2021-02-07: 2.5 mg via INTRAVITREAL

## 2021-02-07 NOTE — Assessment & Plan Note (Signed)
No signs of recurrence or today

## 2021-02-07 NOTE — Assessment & Plan Note (Signed)
Geographic atrophy centrally 

## 2021-02-07 NOTE — Assessment & Plan Note (Signed)
CNVM OS, Feeder portion of the macula completely resolved now at 9-week interval yet will need chronic suppression thus repeat today and extend interval next to 3 months

## 2021-02-07 NOTE — Progress Notes (Signed)
02/07/2021     CHIEF COMPLAINT Patient presents for Macular Degeneration and Retina Follow Up (9 Wk F/U OS, poss Avastin OS for wet AMD OS//Pt c/o stable VA OS when reading or trying to read a clock. No other new symptoms OU./LBS: does not check)   HISTORY OF PRESENT ILLNESS: [name] is a 70 y.o. male who presents to the clinic today for:   HPI     Retina Follow Up           Diagnosis: Wet AMD   Laterality: left eye   Onset: 9 weeks ago   Severity: mild   Duration: 9 weeks   Course: gradually worsening   Comments: 9 Wk F/U OS, poss Avastin OS for wet AMD OS  Pt c/o stable VA OS when reading or trying to read a clock. No other new symptoms OU. LBS: does not check       Last edited by Hurman Horn, MD on 02/07/2021 10:37 AM.      Referring physician: Care, Jinny Blossom [address],  [place] [address]  HISTORICAL INFORMATION:   Selected notes from the MEDICAL RECORD NUMBER    Lab Results  Component Value Date   HGBA1C 7.8 (H) 06/14/2020     CURRENT MEDICATIONS: No current outpatient medications on file. (Ophthalmic Drugs)   No current facility-administered medications for this visit. (Ophthalmic Drugs)   Current Outpatient Medications (Other)  Medication Sig   Accu-Chek FastClix Lancets MISC USE UTD   ACCU-CHEK GUIDE test strip U TO TEST BLOOD SUGAR QD   atorvastatin (LIPITOR) 40 MG tablet Take 1 tablet (40 mg total) by mouth every evening. (Patient taking differently: Take 40 mg by mouth every evening.)   Blood Glucose Monitoring Suppl (ACCU-CHEK GUIDE) w/Device KIT U TO TEST BLOOD SUGAR QD   carvedilol (COREG) 12.5 MG tablet TAKE ONE TABLET BY MOUTH TWICE A DAY (Patient taking differently: Take 12.5 mg by mouth 2 (two) times daily with a meal.)   Dextromethorphan HBr (ROBITUSSIN LINGERING COUGHGELS) 15 MG CAPS Take 15 mg by mouth daily as needed (cough).    famotidine (PEPCID) 20 MG tablet Take 20 mg by mouth at  bedtime.   fenofibrate (TRICOR) 48 MG tablet Take 48 mg by mouth daily.   finasteride (PROSCAR) 5 MG tablet Take 5 mg by mouth daily.   gabapentin (NEURONTIN) 400 MG capsule Take 400 mg by mouth 2 (two) times daily.   HYDROcodone-acetaminophen (NORCO/VICODIN) 5-325 MG tablet Take 1-2 tablets by mouth every 6 (six) hours as needed for moderate pain.   metFORMIN (GLUCOPHAGE) 1000 MG tablet Take 0.5 tablets (500 mg total) by mouth 2 (two) times daily with a meal.   montelukast (SINGULAIR) 10 MG tablet Take 10 mg by mouth daily.    Multiple Vitamin (MULTIVITAMIN) tablet Take 1 tablet by mouth daily.   pantoprazole (PROTONIX) 40 MG tablet Take 1 tablet (40 mg total) by mouth daily.   potassium chloride SA (KLOR-CON) 20 MEQ tablet Take 20 mEq by mouth daily.    SYMBICORT 80-4.5 MCG/ACT inhaler Inhale 2 puffs into the lungs daily as needed (wheezing).   tamsulosin (FLOMAX) 0.4 MG CAPS capsule Take 0.4 mg by mouth daily.   torsemide (DEMADEX) 20 MG tablet TAKE TWO TABLETS BY MOUTH EVERY MORNING AND 1 TABLET IN THE EVENING   VENTOLIN HFA 108 (90 Base) MCG/ACT inhaler Inhale 2 puffs into the lungs every 6 (six) hours as needed for wheezing  or shortness of breath.    No current facility-administered medications for this visit. (Other)      REVIEW OF SYSTEMS:    ALLERGIES Allergies  Allergen Reactions   Cashew Nut Oil Palpitations   Percocet [Oxycodone-Acetaminophen] Other (See Comments)    Hallucintaions    PAST MEDICAL HISTORY Past Medical History:  Diagnosis Date   Blockage of coronary artery of heart ([place]) 10/17/2011   wife states blocked 30%   CHF (congestive heart failure) (HCC)    Chronic kidney disease    Diabetes mellitus 10/17/2011   newly dx today   Edema leg    right leg has leaky valve and right foot swells   Emphysema    Excessive ear wax    Fatty liver    Hernia    near navel   Hyperlipidemia    Hypertension    Leaky heart valve    Neuropathy    Rheumatic fever     Sleep apnea    Slow urinary stream    TIA (transient ischemic attack)    Past Surgical History:  Procedure Laterality Date   CIRCUMCISION     LITHOTRIPSY     PRESSURE SENSOR/CARDIOMEMS N/A 05/27/2019   Procedure: PRESSURE SENSOR/CARDIOMEMS;  Surgeon: Larey Dresser, MD;  Location: [facility];  Service: Cardiovascular;  Laterality: N/A;   RIGHT HEART CATH N/A 05/27/2019   Procedure: RIGHT HEART CATH;  Surgeon: Larey Dresser, MD;  Location: [facility];  Service: Cardiovascular;  Laterality: N/A;   TOTAL HIP ARTHROPLASTY Left 06/16/2020   Procedure: TOTAL HIP ARTHROPLASTY ANTERIOR APPROACH;  Surgeon: Rod Can, MD;  Location: [facility];  Service: Orthopedics;  Laterality: Left;    FAMILY HISTORY Family History  Problem Relation Age of Onset   Emphysema Mother    Aneurysm Mother    Emphysema Father    Coronary artery disease Brother    Coronary artery disease Brother    Coronary artery disease Sister     SOCIAL HISTORY Social History   Tobacco Use   Smoking status: Never   Smokeless tobacco: Never  Vaping Use   Vaping Use: Never used  Substance Use Topics   Alcohol use: No   Drug use: No         OPHTHALMIC EXAM:  Base Eye Exam     Visual Acuity (ETDRS)       Right Left   Dist [place] CF at 3' 20/30 +2         Tonometry (Tonopen, 10:35 AM)       Right Left   Pressure 10 10         Pupils       Pupils Shape APD   Right PERRL Round None   Left PERRL Round None         Visual Fields       Left Right    Full    Restrictions  Central scotoma         Extraocular Movement       Right Left    Full, Ortho Full, Ortho         Neuro/Psych     Oriented x3: Yes   Mood/Affect: Normal         Dilation     Both eyes: 1.0% Mydriacyl, 2.5% Phenylephrine @ 10:34 AM           Slit Lamp and Fundus Exam     External Exam       Right  Left   External Normal Normal         Slit Lamp Exam       Right Left    Lids/Lashes Normal Normal   Conjunctiva/Sclera White and quiet White and quiet   Cornea Clear Clear   Anterior Chamber Deep and quiet Deep and quiet   Iris Round and reactive Round and reactive   Lens  2+ Nuclear sclerosis   Anterior Vitreous Normal Normal         Fundus Exam       Right Left   Posterior Vitreous Posterior vitreous detachment Posterior vitreous detachment   Disc Peripapillary atrophy Normal   C/D Ratio 0.45 0.45   Macula Geographic atrophy into the fovea Macular thickening,  No Subretinal hemorrhage   Vessels Normal Normal,,, no DR   Periphery Normal Normal            IMAGING AND PROCEDURES  Imaging and Procedures for 02/07/21  OCT, Retina - OU - Both Eyes       Right Eye Quality was borderline. Scan locations included subfoveal. Central Foveal Thickness: 431. Progression has been stable. Findings include intraretinal fluid, cystoid macular edema, choroidal neovascular membrane, subretinal scarring.   Left Eye Quality was good. Scan locations included subfoveal. Central Foveal Thickness: 263. Findings include abnormal foveal contour, no IRF, no SRF, subretinal scarring, cystoid macular edema.   Notes OS now improved status post intravitreal Avastin for subretinal fluid CNVM recurrence in the superior portion of the macula, not threatening fovea, at 9-week interval.  We will repeat injection today and examination again in 9 weeks, region superior to fovea has completely resolved at 9-week interval  OD with chronic disciform scar, not treatable     Intravitreal Injection, Pharmacologic Agent - OS - Left Eye       Time Out 02/07/2021. 11:20 AM. Confirmed correct patient, procedure, site, and patient consented.   Anesthesia Topical anesthesia was used. Anesthetic medications included [place] 3.5%.   Procedure Preparation included Tobramycin 0.3%, 5% betadine to ocular surface, 10% betadine to eyelids. A 30 gauge needle was used.   Injection: 2.5 mg  bevacizumab 2.5 MG/0.1ML   Route: Intravitreal, Site: Left Eye   NDC: [phone], Lot: [id number]   Post-op Post injection exam found visual acuity of at least counting fingers. The patient tolerated the procedure well. There were no complications. The patient received written and verbal post procedure care education. Post injection medications were not given.              ASSESSMENT/PLAN:  Exudative age-related macular degeneration of left eye with active choroidal neovascularization (HCC) CNVM OS, [place]eder portion of the macula completely resolved now at 9-week interval yet will need chronic suppression thus repeat today and extend interval next to 3 months  Advanced nonexudative age-related macular degeneration of right eye with subfoveal involvement Geographic atrophy centrally  Exudative age-related macular degeneration of right eye with inactive choroidal neovascularization (HCC) No signs of recurrence or today     ICD-10-CM   1. Exudative age-related macular degeneration of left eye with active choroidal neovascularization (HCC)  H35.3221 OCT, Retina - OU - Both Eyes    Intravitreal Injection, Pharmacologic Agent - OS - Left Eye    bevacizumab (AVASTIN) SOSY 2.5 mg    2. Advanced nonexudative age-related macular degeneration of right eye with subfoveal involvement  H35.3114     3. Exudative age-related macular degeneration of right eye with inactive choroidal neovascularization ([place])  G31.5176  1.  OU, much less involved CNVM superior portion of macula monocular patient.  Currently 9-week follow-up.  Repeat injection today for maintenance and follow-up exam dilate OU in 3 months  2.  3.  Ophthalmic Meds Ordered this visit:  Meds ordered this encounter  Medications   bevacizumab (AVASTIN) SOSY 2.5 mg       Return in about 3 months (around 05/10/2021) for DILATE OU, AVASTIN OCT, OS.  There are no Patient Instructions on file for this visit.   Explained the  diagnoses, plan, and follow up with the patient and they expressed understanding.  Patient expressed understanding of the importance of proper follow up care.   Clent Demark Boleslaus Holloway M.D. Diseases & Surgery of the Retina and Vitreous [facility] [place] 02/07/21     Abbreviations: M myopia (nearsighted); A astigmatism; H hyperopia (farsighted); P presbyopia; Mrx spectacle prescription;  CTL contact lenses; OD right eye; OS left eye; OU both eyes  XT exotropia; ET esotropia; PEK punctate epithelial keratitis; PEE punctate epithelial erosions; DES dry eye syndrome; MGD meibomian gland dysfunction; ATs artificial tears; PFAT's preservative free artificial tears; [place] nuclear sclerotic cataract; PSC posterior subcapsular cataract; ERM epi-retinal membrane; PVD posterior vitreous detachment; RD retinal detachment; DM diabetes mellitus; DR diabetic retinopathy; NPDR non-proliferative diabetic retinopathy; PDR proliferative diabetic retinopathy; CSME clinically significant macular edema; DME diabetic macular edema; dbh dot blot hemorrhages; CWS cotton wool spot; POAG primary open angle glaucoma; C/D cup-to-disc ratio; HVF humphrey visual field; GVF goldmann visual field; OCT optical coherence tomography; IOP intraocular pressure; BRVO Branch retinal vein occlusion; CRVO central retinal vein occlusion; CRAO central retinal artery occlusion; BRAO branch retinal artery occlusion; RT retinal tear; SB scleral buckle; PPV pars plana vitrectomy; VH Vitreous hemorrhage; PRP panretinal laser photocoagulation; IVK intravitreal kenalog; VMT vitreomacular traction; MH Macular hole;  NVD neovascularization of the disc; NVE neovascularization elsewhere; AREDS age related eye disease study; ARMD age related macular degeneration; POAG primary open angle glaucoma; EBMD epithelial/anterior basement membrane dystrophy; ACIOL anterior chamber intraocular lens; IOL intraocular lens; PCIOL posterior chamber intraocular lens;  Phaco/IOL phacoemulsification with intraocular lens placement; [place] photorefractive keratectomy; LASIK laser assisted in situ keratomileusis; HTN hypertension; DM diabetes mellitus; COPD chronic obstructive pulmonary disease

## 2021-04-05 ENCOUNTER — Encounter: Payer: Self-pay | Admitting: Podiatry

## 2021-04-05 ENCOUNTER — Other Ambulatory Visit: Payer: Self-pay

## 2021-04-05 ENCOUNTER — Ambulatory Visit (INDEPENDENT_AMBULATORY_CARE_PROVIDER_SITE_OTHER): Payer: Medicare Other | Admitting: Podiatry

## 2021-04-05 DIAGNOSIS — M79674 Pain in right toe(s): Secondary | ICD-10-CM

## 2021-04-05 DIAGNOSIS — E119 Type 2 diabetes mellitus without complications: Secondary | ICD-10-CM | POA: Diagnosis not present

## 2021-04-05 DIAGNOSIS — M79675 Pain in left toe(s): Secondary | ICD-10-CM

## 2021-04-05 DIAGNOSIS — B351 Tinea unguium: Secondary | ICD-10-CM | POA: Diagnosis not present

## 2021-04-05 DIAGNOSIS — I872 Venous insufficiency (chronic) (peripheral): Secondary | ICD-10-CM

## 2021-04-05 NOTE — Progress Notes (Signed)
This patient returns to my office for at risk foot care.  This patient requires this care by a professional since this patient will be at risk due to having diabetes type 2 and chronic venous insufficiency.  This patient is unable to cut nails himself since the patient cannot reach his nails.These nails are painful walking and wearing shoes.  This patient presents for at risk foot care today.  General Appearance  Alert, conversant and in no acute stress.  Vascular  Dorsalis pedis and posterior tibial  pulses are not  palpable  bilaterally.  Capillary return is within normal limits  bilaterally. Temperature is within normal limits  bilaterally.  Neurologic  Senn-Weinstein monofilament wire test diminished/absent  bilaterally. Muscle power within normal limits bilaterally.  Nails Thick disfigured discolored nails with subungual debris  from hallux to fifth toes bilaterally. No evidence of bacterial infection or drainage bilaterally.  Orthopedic  No limitations of motion  feet .  No crepitus or effusions noted.  No bony pathology or digital deformities noted.  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  Midfoot  DJD  B/L.  Limited rearfoot  ROM  B/L.  Skin  normotropic skin with no porokeratosis noted bilaterally.  No signs of infections or ulcers noted.     Onychomycosis  Pain in right toes  Pain in left toes  Consent was obtained for treatment procedures.   Mechanical debridement of nails 1-5  bilaterally performed with a nail nipper.  Filed with dremel without incident.    Return office visit    3 months                  Told patient to return for periodic foot care and evaluation due to potential at risk complications.   Timiya Howells DPM  

## 2021-05-04 ENCOUNTER — Telehealth (HOSPITAL_COMMUNITY): Payer: Self-pay | Admitting: Adult Health

## 2021-05-04 NOTE — Telephone Encounter (Signed)
  I left a message. Please call  [employer] Goal 21  Todays reading trending up over the last few days . Today up to 24.   Please take extra 20 mg torsemide for the next 2 days.   Please call.   Miaya Lafontant NP-C  2:43 PM

## 2021-05-04 NOTE — Telephone Encounter (Signed)
Left vm for return call

## 2021-05-09 ENCOUNTER — Telehealth (HOSPITAL_COMMUNITY): Payer: Self-pay | Admitting: Adult Health

## 2021-05-09 NOTE — Telephone Encounter (Signed)
   Called and left message.   Card[employer]ems Goal 21  Todays reading 25. Suggestive of fluid accumulation.   Left message to take an extra 20 mg of torsemide for the next 2 days.   Takota Cahalan NP-C  2:21 PM

## 2021-05-16 ENCOUNTER — Encounter (INDEPENDENT_AMBULATORY_CARE_PROVIDER_SITE_OTHER): Payer: Self-pay | Admitting: Ophthalmology

## 2021-05-16 ENCOUNTER — Other Ambulatory Visit: Payer: Self-pay

## 2021-05-16 ENCOUNTER — Ambulatory Visit (INDEPENDENT_AMBULATORY_CARE_PROVIDER_SITE_OTHER): Payer: Medicare Other | Admitting: Ophthalmology

## 2021-05-16 DIAGNOSIS — E119 Type 2 diabetes mellitus without complications: Secondary | ICD-10-CM | POA: Diagnosis not present

## 2021-05-16 DIAGNOSIS — H2512 Age-related nuclear cataract, left eye: Secondary | ICD-10-CM

## 2021-05-16 DIAGNOSIS — H353221 Exudative age-related macular degeneration, left eye, with active choroidal neovascularization: Secondary | ICD-10-CM

## 2021-05-16 DIAGNOSIS — H353212 Exudative age-related macular degeneration, right eye, with inactive choroidal neovascularization: Secondary | ICD-10-CM

## 2021-05-16 MED ORDER — BEVACIZUMAB 2.5 MG/0.1ML IZ SOSY
2.5000 mg | PREFILLED_SYRINGE | INTRAVITREAL | Status: AC | PRN
  Administered 2021-05-16: 2.5 mg via INTRAVITREAL

## 2021-05-16 NOTE — Assessment & Plan Note (Signed)
OS today, much less CNVM activity in this monocular patient.  History of multiple recurrences we will thus retreat today at 38-month interval to maintain current status and prevent recurrences  Follow-up OS in 3 months

## 2021-05-16 NOTE — Assessment & Plan Note (Signed)
Old chronic subfoveal disciform with CME not involving the fovea, overall less active, will monitor and observe only

## 2021-05-16 NOTE — Progress Notes (Signed)
05/16/2021     CHIEF COMPLAINT Patient presents for  Chief Complaint  Patient presents with   Retina Follow Up      HISTORY OF PRESENT ILLNESS: [name] is a 70 y.o. male who presents to the clinic today for:   HPI     Retina Follow Up   Patient presents with  Wet AMD.  In both eyes.  This started 3 months ago.  Duration of 3 months.  Since onset it is gradually worsening.        Comments   3 month f/u OU with OCT with possible Avastin injection OS  Pt c/o increased blurriness in the right eye since previous visit.      Last edited by Frederik Pear, COA on 05/16/2021 10:58 AM.      Referring physician: Jethro Bolus, MD [address] [place],  [place] [address]  HISTORICAL INFORMATION:   Selected notes from the MEDICAL RECORD NUMBER    Lab Results  Component Value Date   HGBA1C 7.8 (H) 06/14/2020     CURRENT MEDICATIONS: No current outpatient medications on file. (Ophthalmic Drugs)   No current facility-administered medications for this visit. (Ophthalmic Drugs)   Current Outpatient Medications (Other)  Medication Sig   Accu-Chek FastClix Lancets MISC USE UTD   ACCU-CHEK GUIDE test strip U TO TEST BLOOD SUGAR QD   atorvastatin (LIPITOR) 40 MG tablet Take 1 tablet (40 mg total) by mouth every evening. (Patient taking differently: Take 40 mg by mouth every evening.)   Blood Glucose Monitoring Suppl (ACCU-CHEK GUIDE) w/Device KIT U TO TEST BLOOD SUGAR QD   carvedilol (COREG) 12.5 MG tablet TAKE ONE TABLET BY MOUTH TWICE A DAY (Patient taking differently: Take 12.5 mg by mouth 2 (two) times daily with a meal.)   Dextromethorphan HBr (ROBITUSSIN LINGERING COUGHGELS) 15 MG CAPS Take 15 mg by mouth daily as needed (cough).    famotidine (PEPCID) 20 MG tablet Take 20 mg by mouth at bedtime.   fenofibrate (TRICOR) 48 MG tablet Take 48 mg by mouth daily.   finasteride (PROSCAR) 5 MG tablet Take 5 mg by mouth daily.   gabapentin (NEURONTIN) 400 MG  capsule Take 400 mg by mouth 2 (two) times daily.   HYDROcodone-acetaminophen (NORCO/VICODIN) 5-325 MG tablet Take 1-2 tablets by mouth every 6 (six) hours as needed for moderate pain.   metFORMIN (GLUCOPHAGE) 1000 MG tablet Take 0.5 tablets (500 mg total) by mouth 2 (two) times daily with a meal.   montelukast (SINGULAIR) 10 MG tablet Take 10 mg by mouth daily.    Multiple Vitamin (MULTIVITAMIN) tablet Take 1 tablet by mouth daily.   pantoprazole (PROTONIX) 40 MG tablet Take 1 tablet (40 mg total) by mouth daily.   potassium chloride SA (KLOR-CON) 20 MEQ tablet Take 20 mEq by mouth daily.    SYMBICORT 80-4.5 MCG/ACT inhaler Inhale 2 puffs into the lungs daily as needed (wheezing).   tamsulosin (FLOMAX) 0.4 MG CAPS capsule Take 0.4 mg by mouth daily.   torsemide (DEMADEX) 20 MG tablet TAKE TWO TABLETS BY MOUTH EVERY MORNING AND 1 TABLET IN THE EVENING   VENTOLIN HFA 108 (90 Base) MCG/ACT inhaler Inhale 2 puffs into the lungs every 6 (six) hours as needed for wheezing or shortness of breath.    No current facility-administered medications for this visit. (Other)      REVIEW OF SYSTEMS:    ALLERGIES Allergies  Allergen Reactions   Cashew Nut Oil Palpitations   Percocet [Oxycodone-Acetaminophen]  Other (See Comments)    Hallucintaions    PAST MEDICAL HISTORY Past Medical History:  Diagnosis Date   Blockage of coronary artery of heart ([place]) 10/17/2011   wife states blocked 30%   CHF (congestive heart failure) (HCC)    Chronic kidney disease    Diabetes mellitus 10/17/2011   newly dx today   Edema leg    right leg has leaky valve and right foot swells   Emphysema    Excessive ear wax    Fatty liver    Hernia    near navel   Hyperlipidemia    Hypertension    Leaky heart valve    Neuropathy    Rheumatic fever    Sleep apnea    Slow urinary stream    TIA (transient ischemic attack)    Past Surgical History:  Procedure Laterality Date   CIRCUMCISION     LITHOTRIPSY      PRESSURE SENSOR/CARDIOMEMS N/A 05/27/2019   Procedure: PRESSURE SENSOR/CARDIOMEMS;  Surgeon: Larey Dresser, MD;  Location: [facility];  Service: Cardiovascular;  Laterality: N/A;   RIGHT HEART CATH N/A 05/27/2019   Procedure: RIGHT HEART CATH;  Surgeon: Larey Dresser, MD;  Location: [facility];  Service: Cardiovascular;  Laterality: N/A;   TOTAL HIP ARTHROPLASTY Left 06/16/2020   Procedure: TOTAL HIP ARTHROPLASTY ANTERIOR APPROACH;  Surgeon: Rod Can, MD;  Location: [facility];  Service: Orthopedics;  Laterality: Left;    FAMILY HISTORY Family History  Problem Relation Age of Onset   Emphysema Mother    Aneurysm Mother    Emphysema Father    Coronary artery disease Brother    Coronary artery disease Brother    Coronary artery disease Sister     SOCIAL HISTORY Social History   Tobacco Use   Smoking status: Never   Smokeless tobacco: Never  Vaping Use   Vaping Use: Never used  Substance Use Topics   Alcohol use: No   Drug use: No         OPHTHALMIC EXAM:  Base Eye Exam     Visual Acuity (ETDRS)       Right Left   Dist [place] CF$RemoveB'@3ft'MNBcIHbM$  20/40 -1   Dist ph  NI NI         Tonometry (Tonopen, 11:06 AM)       Right Left   Pressure 12 12         Pupils       Dark Light Shape React APD   Right 3 3 Irregular Minimal None   Left 2 2 Round Minimal None         Visual Fields (Counting fingers)       Left Right    Full    Restrictions  Partial inner superior temporal, superior nasal deficiencies         Extraocular Movement       Right Left    Full, Ortho Full, Ortho         Neuro/Psych     Oriented x3: Yes   Mood/Affect: Normal         Dilation     Both eyes: 1.0% Mydriacyl, 2.5% Phenylephrine @ 11:06 AM           Slit Lamp and Fundus Exam     External Exam       Right Left   External Normal Normal         Slit Lamp Exam  Right Left   Lids/Lashes Normal Normal   Conjunctiva/Sclera White and  quiet White and quiet   Cornea Clear Clear   Anterior Chamber Deep and quiet Deep and quiet   Iris Round and reactive Round and reactive   Lens Centered posterior chamber intraocular lens 2+ Nuclear sclerosis   Anterior Vitreous Normal Normal         Fundus Exam       Right Left   Posterior Vitreous Posterior vitreous detachment Posterior vitreous detachment   Disc Peripapillary atrophy Normal   C/D Ratio 0.45 0.45   Macula Geographic atrophy into the fovea  No Subretinal hemorrhage, no macular thickening, Retinal pigment epithelial mottling   Vessels Normal Normal,,, no DR   Periphery Normal Normal            IMAGING AND PROCEDURES  Imaging and Procedures for 05/16/21  OCT, Retina - OU - Both Eyes       Right Eye Quality was borderline. Scan locations included subfoveal. Central Foveal Thickness: 399. Progression has been stable. Findings include intraretinal fluid, cystoid macular edema, choroidal neovascular membrane, subretinal scarring, abnormal foveal contour.   Left Eye Quality was good. Scan locations included subfoveal. Central Foveal Thickness: 260. Progression has improved. Findings include abnormal foveal contour, no IRF, no SRF, subretinal scarring, cystoid macular edema.   Notes OS now improved status post intravitreal Avastin for subretinal fluid CNVM recurrence in the superior portion of the macula, not threatening fovea, 3 months interval.  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 11:32 AM. Confirmed correct patient, procedure, site, and patient consented.   Anesthesia Topical anesthesia was used. Anesthetic medications included Lidocaine 4%.   Procedure Preparation included Tobramycin  0.3%, 5% betadine to ocular surface, 10% betadine to eyelids. A 30 gauge needle was used.   Injection: 2.5 mg bevacizumab 2.5 MG/0.1ML   Route: Intravitreal, Site: Left Eye   NDC: [phone], Lot: [id number]   Post-op Post injection exam found visual acuity of at least counting fingers. The patient tolerated the procedure well. There were no complications. The patient received written and verbal post procedure care education. Post injection medications included ocuflox.              ASSESSMENT/PLAN:  Exudative age-related macular degeneration of left eye with active choroidal neovascularization (HCC) OS today, much less CNVM activity in this monocular patient.  History of multiple recurrences we will thus retreat today at 105-month interval to maintain current status and prevent recurrences  Follow-up OS in 3 months  Exudative age-related macular degeneration of right eye with inactive choroidal neovascularization (HCC) Old chronic subfoveal disciform with CME not involving the fovea, overall less active, will monitor and observe only  Diabetes mellitus type 2, controlled, without complications (HCC) No detectable diabetic retinopathy  Age-related nuclear cataract, left The nature of cataract was discussed with the patient as well as the elective nature of surgery. The patient was reassured that surgery at a later date does not put the patient at risk for a worse outcome. It was emphasized that the need for surgery is dictated by the patient's quality of life as influenced by the cataract. Patient was instructed to maintain close follow up with their general eye  care doctor.     ICD-10-CM   1. Exudative age-related macular degeneration of left eye with active choroidal neovascularization (HCC)  H35.3221 OCT, Retina - OU - Both Eyes    Intravitreal Injection, Pharmacologic Agent - OS - Left Eye    bevacizumab (AVASTIN) SOSY 2.5 mg    2. Exudative age-related macular degeneration  of right eye with inactive choroidal neovascularization ([place])  H35.3212     3. Controlled type 2 diabetes mellitus without complication, without long-term current use of insulin (HCC)  E11.9     4. Age-related nuclear cataract, left  H25.12       1.  OS with a history of chronic and occasionally recurrent CNVM and this monocular patient preserved good acuity even with the cataract present OS.  Repeat injection Avastin OS today for wet AMD to maintain  2.  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  OS patient has option for cataract surgery anytime of his choosing  Ophthalmic Meds Ordered this visit:  Meds ordered this encounter  Medications   bevacizumab (AVASTIN) SOSY 2.5 mg       Return in about 3 months (around 08/16/2021) for DILATE OU, OS, COLOR FP.  There are no Patient Instructions on file for this visit.   Explained the diagnoses, plan, and follow up with the patient and they expressed understanding.  Patient expressed understanding of the importance of proper follow up care.   Clent Demark Keili Hasten M.D. Diseases & Surgery of the Retina and Vitreous [facility] 05/16/21     Abbreviations: M myopia (nearsighted); A astigmatism; H hyperopia (farsighted); P presbyopia; Mrx spectacle prescription;  CTL contact lenses; OD right eye; OS left eye; OU both eyes  XT exotropia; ET esotropia; PEK punctate epithelial keratitis; PEE punctate epithelial erosions; DES dry eye syndrome; MGD meibomian gland dysfunction; ATs artificial tears; PFAT's preservative free artificial tears; [place] nuclear sclerotic cataract; PSC posterior subcapsular cataract; ERM epi-retinal membrane; PVD posterior vitreous detachment; RD retinal detachment; DM diabetes mellitus; DR diabetic retinopathy; NPDR non-proliferative diabetic retinopathy; PDR proliferative diabetic retinopathy; CSME clinically significant macular edema; DME diabetic macular edema; dbh dot  blot hemorrhages; CWS cotton wool spot; POAG primary open angle glaucoma; C/D cup-to-disc ratio; HVF humphrey visual field; GVF goldmann visual field; OCT optical coherence tomography; IOP intraocular pressure; BRVO Branch retinal vein occlusion; CRVO central retinal vein occlusion; CRAO central retinal artery occlusion; BRAO branch retinal artery occlusion; RT retinal tear; SB scleral buckle; PPV pars plana vitrectomy; VH Vitreous hemorrhage; PRP panretinal laser photocoagulation; IVK intravitreal kenalog; VMT vitreomacular traction; MH Macular hole;  NVD neovascularization of the disc; NVE neovascularization elsewhere; AREDS age related eye disease study; ARMD age related macular degeneration; POAG primary open angle glaucoma; EBMD epithelial/anterior basement membrane dystrophy; ACIOL anterior chamber intraocular lens; IOL intraocular lens; PCIOL posterior chamber intraocular lens; Phaco/IOL phacoemulsification with intraocular lens placement; [place] photorefractive keratectomy; LASIK laser assisted in situ keratomileusis; HTN hypertension; DM diabetes mellitus; COPD chronic obstructive pulmonary disease

## 2021-05-16 NOTE — Assessment & Plan Note (Signed)

## 2021-05-16 NOTE — Assessment & Plan Note (Signed)
No detectable diabetic retinopathy 

## 2021-05-23 ENCOUNTER — Telehealth (HOSPITAL_COMMUNITY): Payer: Self-pay | Admitting: Adult Health

## 2021-05-23 NOTE — Telephone Encounter (Signed)
   Cardiomems Goal 21  Todays reading is 25.   Please call. Increase torsemide to 40 mg twice a day .  Set up for BMET next week.   Aveya Beal NP-C  1:04 PM

## 2021-05-23 NOTE — Telephone Encounter (Signed)
Left vm for pt to callback 

## 2021-06-16 ENCOUNTER — Other Ambulatory Visit (HOSPITAL_COMMUNITY): Payer: Self-pay | Admitting: Cardiology

## 2021-07-05 ENCOUNTER — Ambulatory Visit (HOSPITAL_COMMUNITY)
Admission: RE | Admit: 2021-07-05 | Discharge: 2021-07-05 | Disposition: A | Discharge status: Home / Self Care | Payer: Medicare HMO | Source: Ambulatory Visit | Attending: Adult Health | Admitting: Adult Health

## 2021-07-05 ENCOUNTER — Other Ambulatory Visit: Payer: Self-pay

## 2021-07-05 DIAGNOSIS — I5022 Chronic systolic (congestive) heart failure: Secondary | ICD-10-CM

## 2021-07-05 NOTE — Progress Notes (Signed)
°  Cardiomems Remote Monitoring  S/P Cardiomems Implant 05/27/2019  PAD Goal: 21 Most recent reading: 24  Recommended changes: None . Check BMET in the next few weeks. Follow up Dr Shirlee Latch 2-3 months.   I continue to review and analyze the patients PA pressures weekly (and more often as needed) to bring PA pressures within the optimal range.     Aryona Sill NP-C  12:44 PM

## 2021-07-10 ENCOUNTER — Ambulatory Visit (INDEPENDENT_AMBULATORY_CARE_PROVIDER_SITE_OTHER): Payer: Medicare HMO | Admitting: Podiatry

## 2021-07-10 ENCOUNTER — Encounter: Payer: Self-pay | Admitting: Podiatry

## 2021-07-10 ENCOUNTER — Other Ambulatory Visit: Payer: Self-pay

## 2021-07-10 DIAGNOSIS — M79674 Pain in right toe(s): Secondary | ICD-10-CM

## 2021-07-10 DIAGNOSIS — I872 Venous insufficiency (chronic) (peripheral): Secondary | ICD-10-CM

## 2021-07-10 DIAGNOSIS — E119 Type 2 diabetes mellitus without complications: Secondary | ICD-10-CM | POA: Diagnosis not present

## 2021-07-10 DIAGNOSIS — B351 Tinea unguium: Secondary | ICD-10-CM

## 2021-07-10 DIAGNOSIS — M79675 Pain in left toe(s): Secondary | ICD-10-CM

## 2021-07-10 NOTE — Progress Notes (Signed)
This patient returns to my office for at risk foot care.  This patient requires this care by a professional since this patient will be at risk due to having diabetes type 2 and chronic venous insufficiency.  This patient is unable to cut nails himself since the patient cannot reach his nails.These nails are painful walking and wearing shoes.  This patient presents for at risk foot care today.  General Appearance  Alert, conversant and in no acute stress.  Vascular  Dorsalis pedis and posterior tibial  pulses are not  palpable  bilaterally.  Capillary return is within normal limits  bilaterally. Temperature is within normal limits  bilaterally.  Neurologic  Senn-Weinstein monofilament wire test diminished/absent  bilaterally. Muscle power within normal limits bilaterally.  Nails Thick disfigured discolored nails with subungual debris  from hallux to fifth toes bilaterally. No evidence of bacterial infection or drainage bilaterally.  Orthopedic  No limitations of motion  feet .  No crepitus or effusions noted.  No bony pathology or digital deformities noted.  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  Midfoot  DJD  B/L.  Limited rearfoot  ROM  B/L.  Skin  normotropic skin with no porokeratosis noted bilaterally.  No signs of infections or ulcers noted.     Onychomycosis  Pain in right toes  Pain in left toes  Consent was obtained for treatment procedures.   Mechanical debridement of nails 1-5  bilaterally performed with a nail nipper.  Filed with dremel without incident.    Return office visit    3 months                  Told patient to return for periodic foot care and evaluation due to potential at risk complications.   Rino Hosea DPM  

## 2021-07-14 LAB — COLOGUARD

## 2021-07-29 LAB — COLOGUARD: COLOGUARD: NEGATIVE

## 2021-08-17 ENCOUNTER — Encounter (INDEPENDENT_AMBULATORY_CARE_PROVIDER_SITE_OTHER): Payer: Self-pay | Admitting: Ophthalmology

## 2021-08-17 ENCOUNTER — Ambulatory Visit (INDEPENDENT_AMBULATORY_CARE_PROVIDER_SITE_OTHER): Payer: Medicare HMO | Admitting: Ophthalmology

## 2021-08-17 ENCOUNTER — Other Ambulatory Visit: Payer: Self-pay

## 2021-08-17 DIAGNOSIS — H353221 Exudative age-related macular degeneration, left eye, with active choroidal neovascularization: Secondary | ICD-10-CM

## 2021-08-17 DIAGNOSIS — H353212 Exudative age-related macular degeneration, right eye, with inactive choroidal neovascularization: Secondary | ICD-10-CM | POA: Diagnosis not present

## 2021-08-17 DIAGNOSIS — H25042 Posterior subcapsular polar age-related cataract, left eye: Secondary | ICD-10-CM | POA: Diagnosis not present

## 2021-08-17 NOTE — Assessment & Plan Note (Signed)
Today at 18-month interval no sign of active CNVM will continue to observe OS, needs cataract surgery so as we can monitor the macular condition effectively with OCT clinical exam

## 2021-08-17 NOTE — Assessment & Plan Note (Signed)
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

## 2021-08-17 NOTE — Assessment & Plan Note (Signed)
New onset finding as of today accounting for acuity but also hampering ability to visualize the macula is only left eye.  Patient should return promptly of Dr. Loraine Leriche Shapiro's office for consideration of visual acuity recovery or in the left eye via cataract surgery with intraocular lens implantation

## 2021-08-17 NOTE — Progress Notes (Addendum)
08/17/2021     CHIEF COMPLAINT Patient presents for  Chief Complaint  Patient presents with   Macular Degeneration      HISTORY OF PRESENT ILLNESS: [name] is a 71 y.o. male who presents to the clinic today for:   HPI   3 mos fu OU oct fp. Pt states "I think my vision is worsening. I used to be able to see the T.V. better than I can now. It has been more blurred for 2-3 months. The more light there is in the house, the less I can see the t.v."  Last edited by Laurin Coder on 08/17/2021 10:47 AM.      Referring physician: Rutherford Guys, [place],  [place] [id number]  HISTORICAL INFORMATION:   Selected notes from the MEDICAL RECORD NUMBER    Lab Results  Component Value Date   HGBA1C 7.8 (H) 06/14/2020     CURRENT MEDICATIONS: No current outpatient medications on file. (Ophthalmic Drugs)   No current facility-administered medications for this visit. (Ophthalmic Drugs)   Current Outpatient Medications (Other)  Medication Sig   Accu-Chek FastClix Lancets MISC USE UTD   ACCU-CHEK GUIDE test strip U TO TEST BLOOD SUGAR QD   atorvastatin (LIPITOR) 40 MG tablet Take 1 tablet (40 mg total) by mouth every evening. (Patient taking differently: Take 40 mg by mouth every evening.)   Blood Glucose Monitoring Suppl (ACCU-CHEK GUIDE) w/Device KIT U TO TEST BLOOD SUGAR QD   carvedilol (COREG) 12.5 MG tablet TAKE ONE TABLET BY MOUTH TWICE A DAY (Patient taking differently: Take 12.5 mg by mouth 2 (two) times daily with a meal.)   Dextromethorphan HBr (ROBITUSSIN LINGERING COUGHGELS) 15 MG CAPS Take 15 mg by mouth daily as needed (cough).    famotidine (PEPCID) 20 MG tablet Take 20 mg by mouth at bedtime.   fenofibrate (TRICOR) 48 MG tablet Take 48 mg by mouth daily.   finasteride (PROSCAR) 5 MG tablet Take 5 mg by mouth daily.   gabapentin (NEURONTIN) 400 MG capsule Take 400 mg by mouth 2 (two) times daily.   HYDROcodone-acetaminophen (NORCO/VICODIN)  5-325 MG tablet Take 1-2 tablets by mouth every 6 (six) hours as needed for moderate pain.   metFORMIN (GLUCOPHAGE) 1000 MG tablet Take 0.5 tablets (500 mg total) by mouth 2 (two) times daily with a meal.   montelukast (SINGULAIR) 10 MG tablet Take 10 mg by mouth daily.    Multiple Vitamin (MULTIVITAMIN) tablet Take 1 tablet by mouth daily.   pantoprazole (PROTONIX) 40 MG tablet Take 1 tablet (40 mg total) by mouth daily.   potassium chloride SA (KLOR-CON) 20 MEQ tablet Take 20 mEq by mouth daily.    SYMBICORT 80-4.5 MCG/ACT inhaler Inhale 2 puffs into the lungs daily as needed (wheezing).   tamsulosin (FLOMAX) 0.4 MG CAPS capsule Take 0.4 mg by mouth daily.   torsemide (DEMADEX) 20 MG tablet Take 2 tablets (40 mg total) by mouth 2 (two) times daily. Absolute last refill without office visit please call [phone] to schedule   VENTOLIN HFA 108 (90 Base) MCG/ACT inhaler Inhale 2 puffs into the lungs every 6 (six) hours as needed for wheezing or shortness of breath.    No current facility-administered medications for this visit. (Other)      REVIEW OF SYSTEMS:    ALLERGIES Allergies  Allergen Reactions   Cashew Nut Oil Palpitations   Percocet [Oxycodone-Acetaminophen] Other (See Comments)    Hallucintaions    PAST MEDICAL HISTORY  Past Medical History:  Diagnosis Date   Blockage of coronary artery of heart ([place]) 10/17/2011   wife states blocked 30%   CHF (congestive heart failure) ([place])    Chronic kidney disease    Diabetes mellitus 10/17/2011   newly dx today   Edema leg    right leg has leaky valve and right foot swells   Emphysema    Excessive ear wax    Fatty liver    Hernia    near navel   Hyperlipidemia    Hypertension    Leaky heart valve    Neuropathy    Rheumatic fever    Sleep apnea    Slow urinary stream    TIA (transient ischemic attack)    Past Surgical History:  Procedure Laterality Date   CIRCUMCISION     LITHOTRIPSY     PRESSURE SENSOR/CARDIOMEMS  N/A 05/27/2019   Procedure: PRESSURE SENSOR/CARDIOMEMS;  Surgeon: Larey Dresser, MD;  Location: [facility];  Service: Cardiovascular;  Laterality: N/A;   RIGHT HEART CATH N/A 05/27/2019   Procedure: RIGHT HEART CATH;  Surgeon: Larey Dresser, MD;  Location: [facility];  Service: Cardiovascular;  Laterality: N/A;   TOTAL HIP ARTHROPLASTY Left 06/16/2020   Procedure: TOTAL HIP ARTHROPLASTY ANTERIOR APPROACH;  Surgeon: Rod Can, MD;  Location: [facility];  Service: Orthopedics;  Laterality: Left;    FAMILY HISTORY Family History  Problem Relation Age of Onset   Emphysema Mother    Aneurysm Mother    Emphysema Father    Coronary artery disease Brother    Coronary artery disease Brother    Coronary artery disease Sister     SOCIAL HISTORY Social History   Tobacco Use   Smoking status: Never   Smokeless tobacco: Never  Vaping Use   Vaping Use: Never used  Substance Use Topics   Alcohol use: No   Drug use: No         OPHTHALMIC EXAM:  Base Eye Exam     Visual Acuity (ETDRS)       Right Left   Dist [place] CF at 3' 20/100 +2   Dist ph [place]  20/70         Tonometry (Tonopen, 10:46 AM)       Right Left   Pressure 9 8         Pupils       Dark Light Shape React APD   Right 2 2 Irregular Minimal None   Left 2 2  Minimal None         Visual Fields (Counting fingers)       Left Right    Full    Restrictions  Total superior nasal deficiency; Partial outer superior temporal, inferior nasal deficiencies         Extraocular Movement       Right Left    Full Full         Neuro/Psych     Oriented x3: Yes   Mood/Affect: Normal         Dilation     Both eyes: 1.0% Mydriacyl, 2.5% Phenylephrine @ 10:46 AM           Slit Lamp and Fundus Exam     External Exam       Right Left   External Normal Normal         Slit Lamp Exam       Right Left   Lids/Lashes Normal Normal  Conjunctiva/Sclera White and quiet White  and quiet   Cornea Clear Clear   Anterior Chamber Deep and quiet Deep and quiet   Iris Round and reactive Round and reactive   Lens Centered posterior chamber intraocular lens 3+ Nuclear sclerosis, 2+ Posterior subcapsular cataract   Anterior Vitreous Normal Normal         Fundus Exam       Right Left   Posterior Vitreous Posterior vitreous detachment Posterior vitreous detachment   Disc Peripapillary atrophy Normal   C/D Ratio 0.45 0.45   Macula Geographic atrophy into the fovea  No Subretinal hemorrhage, no macular thickening, Retinal pigment epithelial mottling   Vessels Normal Normal,,, no DR   Periphery Normal Normal            IMAGING AND PROCEDURES  Imaging and Procedures for 08/17/21  OCT, Retina - OU - Both Eyes       Right Eye Quality was borderline. Scan locations included subfoveal. Central Foveal Thickness: 399. Progression has been stable. Findings include intraretinal fluid, cystoid macular edema, choroidal neovascular membrane, subretinal scarring, abnormal foveal contour.   Left Eye Quality was borderline. Scan locations included subfoveal. Progression has improved. Findings include abnormal foveal contour, no IRF, no SRF, subretinal scarring, cystoid macular edema.   Notes OS now improved status post intravitreal Avastin for subretinal fluid CNVM recurrence in the superior portion of the macula, not threatening fovea, 3 months interval.  Vision OS anatomically resolved by OCT although some Caryl Comes in the quality OCT due to central visual axis involving PSC cataract in the left eye.  Drop in vision and correlates with this as well.  OD with chronic disciform scar, not treatable, with chronic CME superiorly     Color Fundus Photography Optos - OU - Both Eyes       Right Eye Progression has no prior data. Disc findings include normal observations. Vessels : normal observations. Periphery : normal observations.   Left Eye Progression has no prior data.  Disc findings include normal observations. Macula : drusen. Vessels : normal observations. Periphery : normal observations.   Notes OS with media opacity centrally accounts for his vision, correlates with central visual axis PSC cataract which is new in the left eye today             ASSESSMENT/PLAN:  Posterior subcapsular age-related cataract, left eye New onset finding as of today accounting for acuity but also hampering ability to visualize the macula is only left eye.  Patient should return promptly of Dr. Elta Guadeloupe Shapiro's office for consideration of visual acuity recovery or in the left eye via cataract surgery with intraocular lens implantation  Exudative age-related macular degeneration of right eye with inactive choroidal neovascularization ([place]) Chronic active CNVM disciform not responsive to prior therapy, no treatment required  Exudative age-related macular degeneration of left eye with active choroidal neovascularization ([place]) Today at 59-monthinterval no sign of active CNVM will continue to observe OS, needs cataract surgery so as we can monitor the macular condition effectively with OCT clinical exam     ICD-10-CM   1. Exudative age-related macular degeneration of left eye with active choroidal neovascularization (HCC)  H35.3221 OCT, Retina - OU - Both Eyes    Color Fundus Photography Optos - OU - Both Eyes    2. Exudative age-related macular degeneration of right eye with inactive choroidal neovascularization (HCC)  H35.3212 OCT, Retina - OU - Both Eyes    Color Fundus Photography Optos - OU - Both  Eyes    3. Posterior subcapsular age-related cataract, left eye  H25.042       1.  OS no active CNVM today we will continue to observe monitoring is now hampered by central visual axis PSC patient needs cataract surgery with lens implantation left eye for medical monitoring but also excellent chance of improving visual acuity and functioning as he has recently noticed a  change in his ability to watch television  2.  Follow-up Dr. Rutherford Guys promptly for consideration of cataract extraction lens implantation left eye  3.  Ophthalmic Meds Ordered this visit:  No orders of the defined types were placed in this encounter.      Return in about 3 months (around 11/14/2021) for DILATE OU, COLOR FP, OCT.  There are no Patient Instructions on file for this visit.   Explained the diagnoses, plan, and follow up with the patient and they expressed understanding.  Patient expressed understanding of the importance of proper follow up care.   Clent Demark Chantella Creech M.D. Diseases & Surgery of the Retina and Vitreous [facility] [place] 08/17/21     Abbreviations: M myopia (nearsighted); A astigmatism; H hyperopia (farsighted); P presbyopia; Mrx spectacle prescription;  CTL contact lenses; OD right eye; OS left eye; OU both eyes  XT exotropia; ET esotropia; PEK punctate epithelial keratitis; PEE punctate epithelial erosions; DES dry eye syndrome; MGD meibomian gland dysfunction; ATs artificial tears; PFAT's preservative free artificial tears; [place] nuclear sclerotic cataract; PSC posterior subcapsular cataract; ERM epi-retinal membrane; PVD posterior vitreous detachment; RD retinal detachment; DM diabetes mellitus; DR diabetic retinopathy; NPDR non-proliferative diabetic retinopathy; PDR proliferative diabetic retinopathy; CSME clinically significant macular edema; DME diabetic macular edema; dbh dot blot hemorrhages; CWS cotton wool spot; POAG primary open angle glaucoma; C/D cup-to-disc ratio; HVF humphrey visual field; GVF goldmann visual field; OCT optical coherence tomography; IOP intraocular pressure; BRVO Branch retinal vein occlusion; CRVO central retinal vein occlusion; CRAO central retinal artery occlusion; BRAO branch retinal artery occlusion; RT retinal tear; SB scleral buckle; PPV pars plana vitrectomy; VH Vitreous hemorrhage; PRP panretinal laser  photocoagulation; IVK intravitreal kenalog; VMT vitreomacular traction; MH Macular hole;  NVD neovascularization of the disc; NVE neovascularization elsewhere; AREDS age related eye disease study; ARMD age related macular degeneration; POAG primary open angle glaucoma; EBMD epithelial/anterior basement membrane dystrophy; ACIOL anterior chamber intraocular lens; IOL intraocular lens; PCIOL posterior chamber intraocular lens; Phaco/IOL phacoemulsification with intraocular lens placement; [place] photorefractive keratectomy; LASIK laser assisted in situ keratomileusis; HTN hypertension; DM diabetes mellitus; COPD chronic obstructive pulmonary disease

## 2021-10-16 ENCOUNTER — Ambulatory Visit (INDEPENDENT_AMBULATORY_CARE_PROVIDER_SITE_OTHER): Payer: Medicare HMO | Admitting: Podiatry

## 2021-10-16 ENCOUNTER — Encounter: Payer: Self-pay | Admitting: Podiatry

## 2021-10-16 ENCOUNTER — Telehealth (HOSPITAL_COMMUNITY): Payer: Self-pay | Admitting: Adult Health

## 2021-10-16 DIAGNOSIS — M79674 Pain in right toe(s): Secondary | ICD-10-CM

## 2021-10-16 DIAGNOSIS — E119 Type 2 diabetes mellitus without complications: Secondary | ICD-10-CM | POA: Diagnosis not present

## 2021-10-16 DIAGNOSIS — B351 Tinea unguium: Secondary | ICD-10-CM | POA: Diagnosis not present

## 2021-10-16 DIAGNOSIS — M79675 Pain in left toe(s): Secondary | ICD-10-CM

## 2021-10-16 DIAGNOSIS — I872 Venous insufficiency (chronic) (peripheral): Secondary | ICD-10-CM | POA: Diagnosis not present

## 2021-10-16 NOTE — Telephone Encounter (Signed)
? ?  Cardiomems Goal 21---> Todays reading 26  ? ? ?I called and left a message to take extra torsemide 20 mg for the next 2 days.  ? ?Please set up for BMET  ? ? ?Rimas Gilham NP-C  ?2:08 PM ? ? ? ?

## 2021-10-16 NOTE — Progress Notes (Signed)
This patient returns to my office for at risk foot care.  This patient requires this care by a professional since this patient will be at risk due to having diabetes type 2 and chronic venous insufficiency.  This patient is unable to cut nails himself since the patient cannot reach his nails.These nails are painful walking and wearing shoes.  This patient presents for at risk foot care today.  General Appearance  Alert, conversant and in no acute stress.  Vascular  Dorsalis pedis and posterior tibial  pulses are not  palpable  bilaterally.  Capillary return is within normal limits  bilaterally. Temperature is within normal limits  bilaterally.  Neurologic  Senn-Weinstein monofilament wire test diminished/absent  bilaterally. Muscle power within normal limits bilaterally.  Nails Thick disfigured discolored nails with subungual debris  from hallux to fifth toes bilaterally. No evidence of bacterial infection or drainage bilaterally.  Orthopedic  No limitations of motion  feet .  No crepitus or effusions noted.  No bony pathology or digital deformities noted.  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  Midfoot  DJD  B/L.  Limited rearfoot  ROM  B/L.  Skin  normotropic skin with no porokeratosis noted bilaterally.  No signs of infections or ulcers noted.     Onychomycosis  Pain in right toes  Pain in left toes  Consent was obtained for treatment procedures.   Mechanical debridement of nails 1-5  bilaterally performed with a nail nipper.  Filed with dremel without incident.    Return office visit    3 months                  Told patient to return for periodic foot care and evaluation due to potential at risk complications.   Naaman Curro DPM  

## 2021-11-10 NOTE — Telephone Encounter (Signed)
LATE ENTRY: No answer, Left message to return call. ? ?

## 2021-11-13 ENCOUNTER — Telehealth (HOSPITAL_COMMUNITY): Payer: Self-pay | Admitting: Adult Health

## 2021-11-13 NOTE — Telephone Encounter (Signed)
? ? ?  Cardiomems Goal 21  ? ?Current reading 28 suggestive of fluid accumulation.  ? ?Please call and instruct to take torsemide 60 mg twice a day x 3 days then back to torsemide 40 mg twice a day.  ? ? ?Jonny Longino NP-C  ?4:56 PM ? ?

## 2021-11-14 ENCOUNTER — Ambulatory Visit (INDEPENDENT_AMBULATORY_CARE_PROVIDER_SITE_OTHER): Payer: Medicare HMO | Admitting: Ophthalmology

## 2021-11-14 ENCOUNTER — Encounter (INDEPENDENT_AMBULATORY_CARE_PROVIDER_SITE_OTHER): Payer: Self-pay | Admitting: Ophthalmology

## 2021-11-14 DIAGNOSIS — H353212 Exudative age-related macular degeneration, right eye, with inactive choroidal neovascularization: Secondary | ICD-10-CM

## 2021-11-14 DIAGNOSIS — H353114 Nonexudative age-related macular degeneration, right eye, advanced atrophic with subfoveal involvement: Secondary | ICD-10-CM

## 2021-11-14 DIAGNOSIS — H353211 Exudative age-related macular degeneration, right eye, with active choroidal neovascularization: Secondary | ICD-10-CM

## 2021-11-14 DIAGNOSIS — Z961 Presence of intraocular lens: Secondary | ICD-10-CM

## 2021-11-14 DIAGNOSIS — E119 Type 2 diabetes mellitus without complications: Secondary | ICD-10-CM | POA: Diagnosis not present

## 2021-11-14 DIAGNOSIS — H2512 Age-related nuclear cataract, left eye: Secondary | ICD-10-CM

## 2021-11-14 DIAGNOSIS — H353221 Exudative age-related macular degeneration, left eye, with active choroidal neovascularization: Secondary | ICD-10-CM

## 2021-11-14 MED ORDER — BEVACIZUMAB 2.5 MG/0.1ML IZ SOSY
2.5000 mg | PREFILLED_SYRINGE | INTRAVITREAL | Status: AC | PRN
  Administered 2021-11-14: 2.5 mg via INTRAVITREAL

## 2021-11-14 NOTE — Progress Notes (Signed)
? ? ?11/14/2021 ? ?  ? ?CHIEF COMPLAINT ?Patient presents for  ?Chief Complaint  ?Patient presents with  ? Macular Degeneration  ? ? ? ? ?HISTORY OF PRESENT ILLNESS: ?[name] is a 71 y.o. male who presents to the clinic today for:  ? ?HPI   ?3 mos fu OU oct. ?Dr Gershon Crane did Cataract surgery on the left eye on October 25, 2021. Patient states "I can see so much more clearly. I can see to write my name. It feels good to be able to see." ?Patient reports he finished all prescription eye drops last weekend and is not using any eye drops anymore. ?Last edited by Laurin Coder on 11/14/2021 10:38 AM.  ?  ? ? ?Referring physician: ?Rutherford Guys, MD ?[address] ?[place],  [place] [id number] ? ?HISTORICAL INFORMATION:  ? ?Selected notes from the [facility] ?  ? ?Lab Results  ?Component Value Date  ? HGBA1C 7.8 (H) 06/14/2020  ?  ? ?CURRENT MEDICATIONS: ?No current outpatient medications on file. (Ophthalmic Drugs)  ? ?No current facility-administered medications for this visit. (Ophthalmic Drugs)  ? ?Current Outpatient Medications (Other)  ?Medication Sig  ? Accu-Chek FastClix Lancets MISC USE UTD  ? ACCU-CHEK GUIDE test strip U TO TEST BLOOD SUGAR QD  ? atorvastatin (LIPITOR) 40 MG tablet Take 1 tablet (40 mg total) by mouth every evening. (Patient taking differently: Take 40 mg by mouth every evening.)  ? Blood Glucose Monitoring Suppl (ACCU-CHEK GUIDE) w/Device KIT U TO TEST BLOOD SUGAR QD  ? carvedilol (COREG) 12.5 MG tablet TAKE ONE TABLET BY MOUTH TWICE A DAY (Patient taking differently: Take 12.5 mg by mouth 2 (two) times daily with a meal.)  ? Dextromethorphan HBr (ROBITUSSIN LINGERING COUGHGELS) 15 MG CAPS Take 15 mg by mouth daily as needed (cough).   ? famotidine (PEPCID) 20 MG tablet Take 20 mg by mouth at bedtime.  ? fenofibrate (TRICOR) 48 MG tablet Take 48 mg by mouth daily.  ? finasteride (PROSCAR) 5 MG tablet Take 5 mg by mouth daily.  ? gabapentin (NEURONTIN) 400 MG capsule Take 400 mg by  mouth 2 (two) times daily.  ? HYDROcodone-acetaminophen (NORCO/VICODIN) 5-325 MG tablet Take 1-2 tablets by mouth every 6 (six) hours as needed for moderate pain.  ? metFORMIN (GLUCOPHAGE) 1000 MG tablet Take 0.5 tablets (500 mg total) by mouth 2 (two) times daily with a meal.  ? montelukast (SINGULAIR) 10 MG tablet Take 10 mg by mouth daily.   ? Multiple Vitamin (MULTIVITAMIN) tablet Take 1 tablet by mouth daily.  ? pantoprazole (PROTONIX) 40 MG tablet Take 1 tablet (40 mg total) by mouth daily.  ? potassium chloride SA (KLOR-CON) 20 MEQ tablet Take 20 mEq by mouth daily.   ? SYMBICORT 80-4.5 MCG/ACT inhaler Inhale 2 puffs into the lungs daily as needed (wheezing).  ? tamsulosin (FLOMAX) 0.4 MG CAPS capsule Take 0.4 mg by mouth daily.  ? torsemide (DEMADEX) 20 MG tablet Take 2 tablets (40 mg total) by mouth 2 (two) times daily. Absolute last refill without office visit please call [phone] to schedule  ? VENTOLIN HFA 108 (90 Base) MCG/ACT inhaler Inhale 2 puffs into the lungs every 6 (six) hours as needed for wheezing or shortness of breath.   ? ?No current facility-administered medications for this visit. (Other)  ? ? ? ? ?REVIEW OF SYSTEMS: ?ROS   ?Negative for: Constitutional, Gastrointestinal, Neurological, Skin, Genitourinary, Musculoskeletal, HENT, Endocrine, Cardiovascular, Eyes, Respiratory, Psychiatric, Allergic/Imm, Heme/Lymph ?Last edited by Chen Holzman,  Clent Demark, MD on 11/14/2021 11:26 AM.  ?  ? ? ? ?ALLERGIES ?Allergies  ?Allergen Reactions  ? Cashew Nut Oil Palpitations  ? Percocet [Oxycodone-Acetaminophen] Other (See Comments)  ?  Hallucintaions  ? ? ?PAST MEDICAL HISTORY ?Past Medical History:  ?Diagnosis Date  ? Blockage of coronary artery of heart ([place]) 10/17/2011  ? wife states blocked 30%  ? CHF (congestive heart failure) ([place])   ? Chronic kidney disease   ? Diabetes mellitus 10/17/2011  ? newly dx today  ? Edema leg   ? right leg has leaky valve and right foot swells  ? Emphysema   ? Excessive ear  wax   ? Fatty liver   ? Hernia   ? near navel  ? Hyperlipidemia   ? Hypertension   ? Leaky heart valve   ? Neuropathy   ? Rheumatic fever   ? Sleep apnea   ? Slow urinary stream   ? TIA (transient ischemic attack)   ? ?Past Surgical History:  ?Procedure Laterality Date  ? CIRCUMCISION    ? LITHOTRIPSY    ? PRESSURE SENSOR/CARDIOMEMS N/A 05/27/2019  ? Procedure: PRESSURE SENSOR/CARDIOMEMS;  Surgeon: Larey Dresser, MD;  Location: [facility];  Service: Cardiovascular;  Laterality: N/A;  ? RIGHT HEART CATH N/A 05/27/2019  ? Procedure: RIGHT HEART CATH;  Surgeon: Larey Dresser, MD;  Location: [facility];  Service: Cardiovascular;  Laterality: N/A;  ? TOTAL HIP ARTHROPLASTY Left 06/16/2020  ? Procedure: TOTAL HIP ARTHROPLASTY ANTERIOR APPROACH;  Surgeon: Rod Can, MD;  Location: [facility];  Service: Orthopedics;  Laterality: Left;  ? ? ?FAMILY HISTORY ?Family History  ?Problem Relation Age of Onset  ? Emphysema Mother   ? Aneurysm Mother   ? Emphysema Father   ? Coronary artery disease Brother   ? Coronary artery disease Brother   ? Coronary artery disease Sister   ? ? ?SOCIAL HISTORY ?Social History  ? ?Tobacco Use  ? Smoking status: Never  ? Smokeless tobacco: Never  ?Vaping Use  ? Vaping Use: Never used  ?Substance Use Topics  ? Alcohol use: No  ? Drug use: No  ? ?  ? ?  ? ?OPHTHALMIC EXAM: ? ?Base Eye Exam   ? ? Visual Acuity (ETDRS)   ? ?   Right Left  ? Dist [place] CF at 1' 20/20 -1  ? Dist ph [place] NI   ? ?  ?  ? ? Tonometry (Tonopen, 10:41 AM)   ? ?   Right Left  ? Pressure 10 11  ? ?  ?  ? ? Pupils   ? ?   Dark Light Shape React APD  ? Right 2 2 Irregular Minimal None  ? Left 2 2 Round Minimal None  ? ?  ?  ? ? Visual Fields   ? ?   Left Right  ?  Full   ? Restrictions  Partial inner superior temporal, inferior temporal, superior nasal, inferior nasal deficiencies  ? ?  ?  ? ? Extraocular Movement   ? ?   Right Left  ?  Full Full  ? ?  ?  ? ? Neuro/Psych   ? ? Oriented x3: Yes  ? Mood/Affect:  Normal  ? ?  ?  ? ? Dilation   ? ? Both eyes: 1.0% Mydriacyl, 2.5% Phenylephrine @ 10:41 AM  ? ?  ?  ? ?  ? ?Slit Lamp and Fundus Exam   ? ? External  Exam   ? ?   Right Left  ? External Normal Normal  ? ?  ?  ? ? Slit Lamp Exam   ? ?   Right Left  ? Lids/Lashes Normal Normal  ? Conjunctiva/Sclera White and quiet White and quiet  ? Cornea Clear Clear  ? Anterior Chamber Deep and quiet Deep and quiet  ? Iris Round and reactive Round and reactive  ? Lens Centered posterior chamber intraocular lens 3+ Nuclear sclerosis, 2+ Posterior subcapsular cataract  ? Anterior Vitreous Normal Normal  ? ?  ?  ? ? Fundus Exam   ? ?   Right Left  ? Posterior Vitreous Posterior vitreous detachment Posterior vitreous detachment  ? Disc Peripapillary atrophy Normal some peripapillary atrophy  ? C/D Ratio 0.45 0.45  ? Macula Geographic atrophy into the fovea, Disciform scar No Subretinal hemorrhage, no macular thickening, Retinal pigment epithelial mottling  ? Vessels Normal Normal,,, no DR  ? Periphery Normal Normal  ? ?  ?  ? ?  ? ? ?IMAGING AND PROCEDURES  ?Imaging and Procedures for 11/14/21 ? ?OCT, Retina - OU - Both Eyes   ? ?   ?Right Eye ?Quality was borderline. Scan locations included subfoveal. Central Foveal Thickness: 399. Progression has been stable. Findings include intraretinal fluid, cystoid macular edema, choroidal neovascular membrane, subretinal scarring, abnormal foveal contour.  ? ?Left Eye ?Quality was borderline. Scan locations included subfoveal. Progression has improved. Findings include abnormal foveal contour, no IRF, no SRF, subretinal scarring, cystoid macular edema.  ? ?Notes ?OS now improved status post intravitreal Avastin for subretinal fluid CNVM recurrence in the superior portion of the macula, not threatening fovea, 3 months interval.  Vision OS anatomically resolved by OCT although some Caryl Comes in the quality OCT due to central visual axis involving PSC cataract in the left eye.  Drop in vision and  correlates with this as well. ? ?OD with chronic disciform scar, not treatable, with chronic CME superiorly ? ?  ? ?Intravitreal Injection, Pharmacologic Agent - OD - Right Eye   ? ?   ?Time Out ?11/14/2021. 1

## 2021-11-14 NOTE — Telephone Encounter (Signed)
Left vm for pt to return call  

## 2021-11-14 NOTE — Assessment & Plan Note (Signed)
We will monitor 

## 2021-11-14 NOTE — Assessment & Plan Note (Signed)
Now at some 6 months after most recent injection left eye and no signs of recurrence we will continue to watch very closely. ?

## 2021-11-14 NOTE — Assessment & Plan Note (Signed)
No detectable diabetic retinopathy 

## 2021-11-14 NOTE — Assessment & Plan Note (Signed)
Worsening of perimacular CNVM disciform scar with intraretinal fluid risking enlargement of scotoma OD.  We will repeat 10 commence treatment with antivegF Avastin today follow-up again in 8 weeks ?

## 2021-11-14 NOTE — Assessment & Plan Note (Signed)
?  Visual acuity restoration post recent cataract surgery with Dr. Jethro Bolus ?

## 2021-11-16 NOTE — Telephone Encounter (Signed)
Phone:  [phone] (M) ... left message

## 2022-01-10 ENCOUNTER — Encounter (INDEPENDENT_AMBULATORY_CARE_PROVIDER_SITE_OTHER): Payer: Self-pay | Admitting: Ophthalmology

## 2022-01-10 ENCOUNTER — Ambulatory Visit (INDEPENDENT_AMBULATORY_CARE_PROVIDER_SITE_OTHER): Payer: Medicare HMO | Admitting: Ophthalmology

## 2022-01-10 DIAGNOSIS — H353123 Nonexudative age-related macular degeneration, left eye, advanced atrophic without subfoveal involvement: Secondary | ICD-10-CM

## 2022-01-10 DIAGNOSIS — H353114 Nonexudative age-related macular degeneration, right eye, advanced atrophic with subfoveal involvement: Secondary | ICD-10-CM | POA: Diagnosis not present

## 2022-01-10 DIAGNOSIS — H353231 Exudative age-related macular degeneration, bilateral, with active choroidal neovascularization: Secondary | ICD-10-CM

## 2022-01-10 DIAGNOSIS — H353221 Exudative age-related macular degeneration, left eye, with active choroidal neovascularization: Secondary | ICD-10-CM | POA: Diagnosis not present

## 2022-01-10 DIAGNOSIS — H353211 Exudative age-related macular degeneration, right eye, with active choroidal neovascularization: Secondary | ICD-10-CM

## 2022-01-10 MED ORDER — BEVACIZUMAB 2.5 MG/0.1ML IZ SOSY
2.5000 mg | PREFILLED_SYRINGE | INTRAVITREAL | Status: AC | PRN
  Administered 2022-01-10: 2.5 mg via INTRAVITREAL

## 2022-01-10 NOTE — Assessment & Plan Note (Signed)
Large disciform scar much smaller today post Avastin some 8 weeks previous.  Repeat injection today to control size of scotoma and prevent enlargement

## 2022-01-10 NOTE — Assessment & Plan Note (Signed)
No active therapy OS for 7 months we will continue to monitor and observe OS closely follow-up in 8 weeks

## 2022-01-10 NOTE — Assessment & Plan Note (Signed)
Limits acuity OD ?

## 2022-01-10 NOTE — Progress Notes (Signed)
01/10/2022     CHIEF COMPLAINT Patient presents for  Chief Complaint  Patient presents with   Macular Degeneration      HISTORY OF PRESENT ILLNESS: [name] is a 71 y.o. male who presents to the clinic today for:   HPI   8 weeks for DILATE OU, AVASTIN OCT, OD. Pt stated vision has improved since cataract surgery.  Last edited by Angeline Slim on 01/10/2022 10:16 AM.      Referring physician: Care, Jovita Kussmaul [facility] [address] [place],  [place] [address]  HISTORICAL INFORMATION:   Selected notes from the MEDICAL RECORD NUMBER    Lab Results  Component Value Date   HGBA1C 7.8 (H) 06/14/2020     CURRENT MEDICATIONS: No current outpatient medications on file. (Ophthalmic Drugs)   No current facility-administered medications for this visit. (Ophthalmic Drugs)   Current Outpatient Medications (Other)  Medication Sig   Accu-Chek FastClix Lancets MISC USE UTD   ACCU-CHEK GUIDE test strip U TO TEST BLOOD SUGAR QD   atorvastatin (LIPITOR) 40 MG tablet Take 1 tablet (40 mg total) by mouth every evening. (Patient taking differently: Take 40 mg by mouth every evening.)   Blood Glucose Monitoring Suppl (ACCU-CHEK GUIDE) w/Device KIT U TO TEST BLOOD SUGAR QD   carvedilol (COREG) 12.5 MG tablet TAKE ONE TABLET BY MOUTH TWICE A DAY (Patient taking differently: Take 12.5 mg by mouth 2 (two) times daily with a meal.)   Dextromethorphan HBr (ROBITUSSIN LINGERING COUGHGELS) 15 MG CAPS Take 15 mg by mouth daily as needed (cough).    famotidine (PEPCID) 20 MG tablet Take 20 mg by mouth at bedtime.   fenofibrate (TRICOR) 48 MG tablet Take 48 mg by mouth daily.   finasteride (PROSCAR) 5 MG tablet Take 5 mg by mouth daily.   gabapentin (NEURONTIN) 400 MG capsule Take 400 mg by mouth 2 (two) times daily.   HYDROcodone-acetaminophen (NORCO/VICODIN) 5-325 MG tablet Take 1-2 tablets by mouth every 6 (six) hours as needed for moderate pain.   metFORMIN (GLUCOPHAGE)  1000 MG tablet Take 0.5 tablets (500 mg total) by mouth 2 (two) times daily with a meal.   montelukast (SINGULAIR) 10 MG tablet Take 10 mg by mouth daily.    Multiple Vitamin (MULTIVITAMIN) tablet Take 1 tablet by mouth daily.   pantoprazole (PROTONIX) 40 MG tablet Take 1 tablet (40 mg total) by mouth daily.   potassium chloride SA (KLOR-CON) 20 MEQ tablet Take 20 mEq by mouth daily.    SYMBICORT 80-4.5 MCG/ACT inhaler Inhale 2 puffs into the lungs daily as needed (wheezing).   tamsulosin (FLOMAX) 0.4 MG CAPS capsule Take 0.4 mg by mouth daily.   torsemide (DEMADEX) 20 MG tablet Take 2 tablets (40 mg total) by mouth 2 (two) times daily. Absolute last refill without office visit please call [phone] to schedule   VENTOLIN HFA 108 (90 Base) MCG/ACT inhaler Inhale 2 puffs into the lungs every 6 (six) hours as needed for wheezing or shortness of breath.    No current facility-administered medications for this visit. (Other)      REVIEW OF SYSTEMS: ROS   Negative for: Constitutional, Gastrointestinal, Neurological, Skin, Genitourinary, Musculoskeletal, HENT, Endocrine, Cardiovascular, Eyes, Respiratory, Psychiatric, Allergic/Imm, Heme/Lymph Last edited by Angeline Slim on 01/10/2022 10:16 AM.       ALLERGIES Allergies  Allergen Reactions   Cashew Nut Oil Palpitations   Percocet [Oxycodone-Acetaminophen] Other (See Comments)    Hallucintaions    PAST MEDICAL  HISTORY Past Medical History:  Diagnosis Date   Blockage of coronary artery of heart ([place]) 10/17/2011   wife states blocked 30%   CHF (congestive heart failure) ([place])    Chronic kidney disease    Diabetes mellitus 10/17/2011   newly dx today   Edema leg    right leg has leaky valve and right foot swells   Emphysema    Excessive ear wax    Fatty liver    Hernia    near navel   Hyperlipidemia    Hypertension    Leaky heart valve    Neuropathy    Rheumatic fever    Sleep apnea    Slow urinary stream    TIA (transient  ischemic attack)    Past Surgical History:  Procedure Laterality Date   CIRCUMCISION     LITHOTRIPSY     PRESSURE SENSOR/CARDIOMEMS N/A 05/27/2019   Procedure: PRESSURE SENSOR/CARDIOMEMS;  Surgeon: Larey Dresser, MD;  Location: [facility];  Service: Cardiovascular;  Laterality: N/A;   RIGHT HEART CATH N/A 05/27/2019   Procedure: RIGHT HEART CATH;  Surgeon: Larey Dresser, MD;  Location: [facility];  Service: Cardiovascular;  Laterality: N/A;   TOTAL HIP ARTHROPLASTY Left 06/16/2020   Procedure: TOTAL HIP ARTHROPLASTY ANTERIOR APPROACH;  Surgeon: Rod Can, MD;  Location: [facility];  Service: Orthopedics;  Laterality: Left;    FAMILY HISTORY Family History  Problem Relation Age of Onset   Emphysema Mother    Aneurysm Mother    Emphysema Father    Coronary artery disease Brother    Coronary artery disease Brother    Coronary artery disease Sister     SOCIAL HISTORY Social History   Tobacco Use   Smoking status: Never   Smokeless tobacco: Never  Vaping Use   Vaping Use: Never used  Substance Use Topics   Alcohol use: No   Drug use: No         OPHTHALMIC EXAM:  Base Eye Exam     Visual Acuity (ETDRS)       Right Left   Dist [place] CF at 1' 20/20         Tonometry (Tonopen, 10:21 AM)       Right Left   Pressure 13 14         Pupils       Pupils APD   Right PERRL None   Left PERRL None         Visual Fields       Left Right    Full    Restrictions  Partial inner superior temporal, inferior temporal, superior nasal, inferior nasal deficiencies         Extraocular Movement       Right Left    Full Full         Neuro/Psych     Oriented x3: Yes   Mood/Affect: Normal         Dilation     Both eyes: 1.0% Mydriacyl, 2.5% Phenylephrine @ 10:21 AM           Slit Lamp and Fundus Exam     External Exam       Right Left   External Normal Normal         Slit Lamp Exam       Right Left   Lids/Lashes  Normal Normal   Conjunctiva/Sclera White and quiet White and quiet   Cornea Clear Clear   Anterior Chamber Deep  and quiet Deep and quiet   Iris Round and reactive Round and reactive   Lens Centered posterior chamber intraocular lens 3+ Nuclear sclerosis, 2+ Posterior subcapsular cataract   Anterior Vitreous Normal Normal         Fundus Exam       Right Left   Posterior Vitreous Posterior vitreous detachment Posterior vitreous detachment   Disc Peripapillary atrophy Normal some peripapillary atrophy   C/D Ratio 0.45 0.45   Macula Geographic atrophy into the fovea, Disciform scar No Subretinal hemorrhage, no macular thickening, Retinal pigment epithelial mottling   Vessels Normal Normal,,, no DR   Periphery Normal Normal            IMAGING AND PROCEDURES  Imaging and Procedures for 01/10/22  OCT, Retina - OU - Both Eyes       Right Eye Quality was borderline. Scan locations included subfoveal. Central Foveal Thickness: 400. Progression has improved. Findings include abnormal foveal contour, choroidal neovascular membrane, cystoid macular edema, intraretinal fluid, subretinal scarring.   Left Eye Quality was borderline. Scan locations included subfoveal. Central Foveal Thickness: 261. Progression has been stable. Findings include no IRF, no SRF, abnormal foveal contour, cystoid macular edema, subretinal scarring.   Notes OS now improved status post intravitreal Avastin for subretinal fluid CNVM recurrence in the superior portion of the macula, not threatening fovea, 7 months interval.  Vision OS anatomically resolved by OCT although some Graciela Husbands in the quality OCT due to central visual axis involving PSC cataract in the left eye.  Drop in vision and correlates with this as well.  OD with chronic disciform scar, much improved OD post most recent injection 8 weeks previous.  Attempted to try to prevent scotoma enlargement OD      Intravitreal Injection, Pharmacologic Agent -  OD - Right Eye       Time Out 01/10/2022. 10:58 AM. Confirmed correct patient, procedure, site, and patient consented.   Anesthesia Topical anesthesia was used. Anesthetic medications included Lidocaine 4%.   Procedure Preparation included 5% betadine to ocular surface, 10% betadine to eyelids. A 30 gauge needle was used.   Injection: 2.5 mg bevacizumab 2.5 MG/0.1ML   Route: Intravitreal, Site: Right Eye   NDC: [phone], Lot: [id number], Expiration date: 03/02/2022   Post-op Post injection exam found visual acuity of at least counting fingers. The patient tolerated the procedure well. There were no complications. The patient received written and verbal post procedure care education. Post injection medications included ocuflox.              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  Repeat injection today to control size of scotoma and prevent enlargement  Exudative age-related macular degeneration of left eye with active choroidal neovascularization (HCC) No active therapy OS for 7 months we will continue to monitor and observe OS closely follow-up in 8 weeks     ICD-10-CM   1. Exudative age-related macular degeneration of right eye with active choroidal neovascularization (HCC)  H35.3211 OCT, Retina - OU - Both Eyes    Intravitreal Injection, Pharmacologic Agent - OD - Right Eye    bevacizumab (AVASTIN) SOSY 2.5 mg  2. Advanced nonexudative age-related macular degeneration of right eye with subfoveal involvement  H35.3114      3. Advanced nonexudative age-related macular degeneration of left eye without subfoveal involvement  H35.3123     4. Exudative age-related macular degeneration of left eye with active choroidal neovascularization ([place])  H35.3221       1.  Much smaller dense central disciform scar OD at 8-week follow-up post Avastin.  Repeat injection today  2.  OS, with history of recurrent CNVM now off therapy for some 7 months.  Watch nasally.  3.  Dilate OU next.  Ophthalmic Meds Ordered this visit:  Meds ordered this encounter  Medications   bevacizumab (AVASTIN) SOSY 2.5 mg       Return in about 8 weeks (around 03/07/2022) for DILATE OU, AVASTIN OCT, OD, possible OS.  There are no Patient Instructions on file for this visit.   Explained the diagnoses, plan, and follow up with the patient and they expressed understanding.  Patient expressed understanding of the importance of proper follow up care.   Clent Demark Toia Micale M.D. Diseases & Surgery of the Retina and Vitreous [facility] 01/10/22     Abbreviations: M myopia (nearsighted); A astigmatism; H hyperopia (farsighted); P presbyopia; Mrx spectacle prescription;  CTL contact lenses; OD right eye; OS left eye; OU both eyes  XT exotropia; ET esotropia; PEK punctate epithelial keratitis; PEE punctate epithelial erosions; DES dry eye syndrome; MGD meibomian gland dysfunction; ATs artificial tears; PFAT's preservative free artificial tears; [place] nuclear sclerotic cataract; PSC posterior subcapsular cataract; ERM epi-retinal membrane; PVD posterior vitreous detachment; RD retinal detachment; DM diabetes mellitus; DR diabetic retinopathy; NPDR non-proliferative diabetic retinopathy; PDR proliferative diabetic retinopathy; CSME clinically significant macular edema; DME diabetic macular edema; dbh dot blot hemorrhages; CWS cotton wool spot; POAG primary open angle glaucoma; C/D cup-to-disc ratio; HVF humphrey visual field; GVF goldmann  visual field; OCT optical coherence tomography; IOP intraocular pressure; BRVO Branch retinal vein occlusion; CRVO central retinal vein occlusion; CRAO central retinal artery occlusion; BRAO branch retinal artery occlusion; RT retinal tear; SB scleral buckle; PPV pars plana vitrectomy; VH Vitreous hemorrhage; PRP panretinal laser photocoagulation; IVK intravitreal kenalog; VMT vitreomacular traction; MH Macular hole;  NVD neovascularization of the disc; NVE neovascularization elsewhere; AREDS age related eye disease study; ARMD age related macular degeneration; POAG primary open angle glaucoma; EBMD epithelial/anterior basement membrane dystrophy; ACIOL anterior chamber intraocular lens; IOL intraocular lens; PCIOL posterior chamber intraocular lens; Phaco/IOL phacoemulsification with intraocular lens placement; [place] photorefractive keratectomy; LASIK laser assisted in situ keratomileusis; HTN hypertension; DM diabetes mellitus; COPD chronic obstructive pulmonary disease

## 2022-01-10 NOTE — Assessment & Plan Note (Signed)
Peripapillary atrophy not impactful acuity at this time may be candidate for Syfovre

## 2022-01-17 ENCOUNTER — Ambulatory Visit (INDEPENDENT_AMBULATORY_CARE_PROVIDER_SITE_OTHER): Payer: Medicare HMO | Admitting: Podiatry

## 2022-01-17 ENCOUNTER — Encounter: Payer: Self-pay | Admitting: Podiatry

## 2022-01-17 DIAGNOSIS — M79674 Pain in right toe(s): Secondary | ICD-10-CM

## 2022-01-17 DIAGNOSIS — B351 Tinea unguium: Secondary | ICD-10-CM | POA: Diagnosis not present

## 2022-01-17 DIAGNOSIS — E119 Type 2 diabetes mellitus without complications: Secondary | ICD-10-CM

## 2022-01-17 DIAGNOSIS — M79675 Pain in left toe(s): Secondary | ICD-10-CM | POA: Diagnosis not present

## 2022-01-17 DIAGNOSIS — I872 Venous insufficiency (chronic) (peripheral): Secondary | ICD-10-CM

## 2022-01-17 NOTE — Progress Notes (Signed)
This patient returns to my office for at risk foot care.  This patient requires this care by a professional since this patient will be at risk due to having diabetes type 2 and chronic venous insufficiency.  This patient is unable to cut nails himself since the patient cannot reach his nails.These nails are painful walking and wearing shoes.  This patient presents for at risk foot care today.  General Appearance  Alert, conversant and in no acute stress.  Vascular  Dorsalis pedis and posterior tibial  pulses are not  palpable  bilaterally.  Capillary return is within normal limits  bilaterally. Temperature is within normal limits  bilaterally.  Neurologic  Senn-Weinstein monofilament wire test diminished/absent  bilaterally. Muscle power within normal limits bilaterally.  Nails Thick disfigured discolored nails with subungual debris  from hallux to fifth toes bilaterally. No evidence of bacterial infection or drainage bilaterally.  Orthopedic  No limitations of motion  feet .  No crepitus or effusions noted.  No bony pathology or digital deformities noted.  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  Midfoot  DJD  B/L.  Limited rearfoot  ROM  B/L.  Skin  normotropic skin with no porokeratosis noted bilaterally.  No signs of infections or ulcers noted.     Onychomycosis  Pain in right toes  Pain in left toes  Consent was obtained for treatment procedures.   Mechanical debridement of nails 1-5  bilaterally performed with a nail nipper.  Filed with dremel without incident.    Return office visit    3 months                  Told patient to return for periodic foot care and evaluation due to potential at risk complications.   Nikka Hakimian DPM  

## 2022-01-19 ENCOUNTER — Telehealth (HOSPITAL_COMMUNITY): Payer: Self-pay | Admitting: Adult Health

## 2022-01-19 NOTE — Telephone Encounter (Signed)
  Cardiomems Remote Monitoring  S/P Cardiomems Implant 05/27/2019   PAD Goal: 21 Most recent reading: 28 --> suggestive of fluid accumulation.   Recommended changes: Please call. Please take an extra 40 mg torsemide for the next 2 days.   I continue to review and analyze the patients PA pressures weekly (and more often as needed) to bring PA pressures within the optimal range.     Brittish Bolinger NP-C 2:22 PM

## 2022-01-22 NOTE — Telephone Encounter (Signed)
lmom 

## 2022-02-26 ENCOUNTER — Telehealth (HOSPITAL_COMMUNITY): Payer: Self-pay | Admitting: Adult Health

## 2022-02-26 DIAGNOSIS — I5022 Chronic systolic (congestive) heart failure: Secondary | ICD-10-CM

## 2022-02-26 NOTE — Telephone Encounter (Signed)
LMOM Phone:  [phone] (M) .Marland KitchenMarland Kitchen

## 2022-02-26 NOTE — Telephone Encounter (Signed)
   Cardiomems Remote Monitoring  S/P Cardiomems Implant   PAD Goal: 21 Most recent reading: 26--> Suggestive of fluid accumulation.   Recommended changes: Please call. Increase torsemide to 60 mg in am and continue 40 mg pm. Set up for BMET.   I continue to review and analyze the patients PA pressures weekly (and more often as needed) to bring PA pressures within the optimal range.    Jezreel Sisk NP-C  9:04 AM

## 2022-02-28 MED ORDER — TORSEMIDE 20 MG PO TABS
ORAL_TABLET | ORAL | 3 refills | Status: DC
Start: 2022-02-28 — End: 2022-04-03

## 2022-02-28 NOTE — Telephone Encounter (Signed)
Pt aware via wife 

## 2022-02-28 NOTE — Addendum Note (Signed)
Addended by: Theresia Bough on: 02/28/2022 03:05 PM   Modules accepted: Orders

## 2022-03-07 ENCOUNTER — Ambulatory Visit (INDEPENDENT_AMBULATORY_CARE_PROVIDER_SITE_OTHER): Payer: Medicare HMO | Admitting: Ophthalmology

## 2022-03-07 ENCOUNTER — Encounter (INDEPENDENT_AMBULATORY_CARE_PROVIDER_SITE_OTHER): Payer: Self-pay | Admitting: Ophthalmology

## 2022-03-07 DIAGNOSIS — H353123 Nonexudative age-related macular degeneration, left eye, advanced atrophic without subfoveal involvement: Secondary | ICD-10-CM

## 2022-03-07 DIAGNOSIS — H353231 Exudative age-related macular degeneration, bilateral, with active choroidal neovascularization: Secondary | ICD-10-CM

## 2022-03-07 DIAGNOSIS — E119 Type 2 diabetes mellitus without complications: Secondary | ICD-10-CM | POA: Diagnosis not present

## 2022-03-07 DIAGNOSIS — H353221 Exudative age-related macular degeneration, left eye, with active choroidal neovascularization: Secondary | ICD-10-CM

## 2022-03-07 DIAGNOSIS — H353211 Exudative age-related macular degeneration, right eye, with active choroidal neovascularization: Secondary | ICD-10-CM

## 2022-03-07 MED ORDER — BEVACIZUMAB CHEMO INJECTION 1.25MG/0.05ML SYRINGE FOR KALEIDOSCOPE
1.2500 mg | INTRAVITREAL | Status: AC | PRN
  Administered 2022-03-07: 1.25 mg via INTRAVITREAL

## 2022-03-07 NOTE — Assessment & Plan Note (Signed)
No detectable diabetic retinopathy  The patient has diabetes without any evidence of retinopathy. The patient advised to maintain good blood glucose control, excellent blood pressure control, and favorable levels of cholesterol, low density lipoprotein, and high density lipoproteins. Follow up in 1 year was recommended. Explained that fluctuations in visual acuity , or "out of focus", may result from large variations of blood sugar control. 

## 2022-03-07 NOTE — Assessment & Plan Note (Signed)
Perifoveal geographic atrophy present

## 2022-03-07 NOTE — Assessment & Plan Note (Signed)
Chronic active large subfoveal disciform scar with overlying fluid and intraretinal atrophy.  At 8-week interval today controlled.  We will repeat injection today and extend interval examination as no enlargement is occurring.

## 2022-03-07 NOTE — Progress Notes (Signed)
03/07/2022     CHIEF COMPLAINT Patient presents for No chief complaint on file.     HISTORY OF PRESENT ILLNESS: [name] is a 71 y.o. male who presents to the clinic today for:   HPI   8 weeks dilate ou avastin oct od possible os Pt states his vision has been stable Pt denies floaters Pt admits to FOL in the right eye Last edited by Morene Rankins, CMA on 03/07/2022 10:48 AM.      Referring physician: Care, Jinny Blossom [facility] [address] [place],  [place] [id number]  HISTORICAL INFORMATION:   Selected notes from the MEDICAL RECORD NUMBER    Lab Results  Component Value Date   HGBA1C 7.8 (H) 06/14/2020     CURRENT MEDICATIONS: No current outpatient medications on file. (Ophthalmic Drugs)   No current facility-administered medications for this visit. (Ophthalmic Drugs)   Current Outpatient Medications (Other)  Medication Sig   Accu-Chek FastClix Lancets MISC USE UTD   ACCU-CHEK GUIDE test strip U TO TEST BLOOD SUGAR QD   atorvastatin (LIPITOR) 40 MG tablet Take 1 tablet (40 mg total) by mouth every evening. (Patient taking differently: Take 40 mg by mouth every evening.)   Blood Glucose Monitoring Suppl (ACCU-CHEK GUIDE) w/Device KIT U TO TEST BLOOD SUGAR QD   carvedilol (COREG) 12.5 MG tablet TAKE ONE TABLET BY MOUTH TWICE A DAY (Patient taking differently: Take 12.5 mg by mouth 2 (two) times daily with a meal.)   Dextromethorphan HBr (ROBITUSSIN LINGERING COUGHGELS) 15 MG CAPS Take 15 mg by mouth daily as needed (cough).    famotidine (PEPCID) 20 MG tablet Take 20 mg by mouth at bedtime.   fenofibrate (TRICOR) 48 MG tablet Take 48 mg by mouth daily.   finasteride (PROSCAR) 5 MG tablet Take 5 mg by mouth daily.   gabapentin (NEURONTIN) 400 MG capsule Take 400 mg by mouth 2 (two) times daily.   HYDROcodone-acetaminophen (NORCO/VICODIN) 5-325 MG tablet Take 1-2 tablets by mouth every 6 (six) hours as needed for moderate pain.    metFORMIN (GLUCOPHAGE) 1000 MG tablet Take 0.5 tablets (500 mg total) by mouth 2 (two) times daily with a meal.   montelukast (SINGULAIR) 10 MG tablet Take 10 mg by mouth daily.    Multiple Vitamin (MULTIVITAMIN) tablet Take 1 tablet by mouth daily.   Multiple Vitamins-Minerals (PRESERVISION AREDS PO) Take by mouth.   pantoprazole (PROTONIX) 40 MG tablet Take 1 tablet (40 mg total) by mouth daily.   potassium chloride SA (KLOR-CON) 20 MEQ tablet Take 20 mEq by mouth daily.    SYMBICORT 80-4.5 MCG/ACT inhaler Inhale 2 puffs into the lungs daily as needed (wheezing).   tamsulosin (FLOMAX) 0.4 MG CAPS capsule Take 0.4 mg by mouth daily.   torsemide (DEMADEX) 20 MG tablet Take 3 tablets (60 mg total) by mouth in the morning AND 2 tablets (40 mg total) every evening.   VENTOLIN HFA 108 (90 Base) MCG/ACT inhaler Inhale 2 puffs into the lungs every 6 (six) hours as needed for wheezing or shortness of breath.    No current facility-administered medications for this visit. (Other)      REVIEW OF SYSTEMS: ROS   Negative for: Constitutional, Gastrointestinal, Neurological, Skin, Genitourinary, Musculoskeletal, HENT, Endocrine, Cardiovascular, Eyes, Respiratory, Psychiatric, Allergic/Imm, Heme/Lymph Last edited by Morene Rankins, CMA on 03/07/2022 10:48 AM.       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[place]) 10/17/2011   wife states blocked 30%   CHF (congestive heart failure) (HCC)    Chronic kidney disease    Diabetes mellitus 10/17/2011   newly dx today   Edema leg    right leg has leaky valve and right foot swells   Emphysema    Excessive ear wax    Fatty liver    Hernia    near navel   Hyperlipidemia    Hypertension    Leaky heart valve    Neuropathy    Rheumatic fever     Sleep apnea    Slow urinary stream    TIA (transient ischemic attack)    Past Surgical History:  Procedure Laterality Date   CIRCUMCISION     LITHOTRIPSY     PRESSURE SENSOR/CARDIOMEMS N/A 05/27/2019   Procedure: PRESSURE SENSOR/CARDIOMEMS;  Surgeon: Larey Dresser, MD;  Location: [facility];  Service: Cardiovascular;  Laterality: N/A;   RIGHT HEART CATH N/A 05/27/2019   Procedure: RIGHT HEART CATH;  Surgeon: Larey Dresser, MD;  Location: [facility];  Service: Cardiovascular;  Laterality: N/A;   TOTAL HIP ARTHROPLASTY Left 06/16/2020   Procedure: TOTAL HIP ARTHROPLASTY ANTERIOR APPROACH;  Surgeon: Rod Can, MD;  Location: [facility];  Service: Orthopedics;  Laterality: Left;    FAMILY HISTORY Family History  Problem Relation Age of Onset   Emphysema Mother    Aneurysm Mother    Emphysema Father    Coronary artery disease Brother    Coronary artery disease Brother    Coronary artery disease Sister     SOCIAL HISTORY Social History   Tobacco Use   Smoking status: Never   Smokeless tobacco: Never  Vaping Use   Vaping Use: Never used  Substance Use Topics   Alcohol use: No   Drug use: No         OPHTHALMIC EXAM:  Base Eye Exam     Visual Acuity (Snellen - Linear)       Right Left   Dist [place] CF at 1' 20/20 -1         Tonometry (Tonopen, 10:52 AM)       Right Left   Pressure 8 9         Neuro/Psych     Oriented x3: Yes   Mood/Affect: Normal         Dilation     Both eyes: 1.0% Mydriacyl, 2.5% Phenylephrine @ 10:49 AM           Slit Lamp and Fundus Exam     External Exam       Right Left   External Normal Normal         Slit Lamp Exam       Right Left   Lids/Lashes Normal Normal   Conjunctiva/Sclera White and quiet White and quiet   Cornea Clear Clear   Anterior Chamber Deep and quiet Deep and quiet   Iris Round and reactive Round and reactive   Lens Centered posterior chamber intraocular lens 3+  Nuclear sclerosis, 2+ Posterior subcapsular cataract   Anterior Vitreous Normal Normal         Fundus Exam       Right Left   Posterior Vitreous Posterior vitreous detachment Posterior vitreous detachment   Disc Peripapillary atrophy Normal some peripapillary atrophy  C/D Ratio 0.45 0.45   Macula Geographic atrophy into the fovea, Disciform scar No Subretinal hemorrhage, no macular thickening, Retinal pigment epithelial mottling   Vessels Normal Normal,,, no DR   Periphery Normal Normal            IMAGING AND PROCEDURES  Imaging and Procedures for 03/07/22  OCT, Retina - OU - Both Eyes       Right Eye Quality was borderline. Scan locations included subfoveal. Central Foveal Thickness: 450. Progression has improved. Findings include abnormal foveal contour, choroidal neovascular membrane, cystoid macular edema, intraretinal fluid, subretinal scarring.   Left Eye Quality was borderline. Scan locations included subfoveal. Central Foveal Thickness: 272. Progression has been stable. Findings include no IRF, no SRF, abnormal foveal contour, cystoid macular edema, subretinal scarring.   Notes OS now improved status post intravitreal Avastin for subretinal fluid CNVM recurrence in the superior portion of the macula, not threatening fovea, 7 months interval.  Vision OS anatomically resolved by OCT although some Caryl Comes in the quality OCT due to central visual axis involving PSC cataract in the left eye.  Drop in vision and correlates with this as well.  OD with chronic disciform scar, much improved OD post most recent injection 8 weeks previous.  Attempted to try to prevent scotoma enlargement OD     Intravitreal Injection, Pharmacologic Agent - OD - Right Eye       Time Out 03/07/2022. 11:43 AM. Confirmed correct patient, procedure, site, and patient consented.   Anesthesia Topical anesthesia was used. Anesthetic medications included Lidocaine 4%.   Procedure Preparation  included 5% betadine to ocular surface, 10% betadine to eyelids. A 30 gauge needle was used.   Injection: 1.25 mg Bevacizumab 1.60m/0.05ml   Route: Intravitreal, Site: Right Eye   NDC: 50242-060-01, Lot: [id number] Expiration date: 05/16/2022   Post-op Post injection exam found visual acuity of at least counting fingers. The patient tolerated the procedure well. There were no complications. The patient received written and verbal post procedure care education. Post injection medications included ocuflox.              ASSESSMENT/PLAN:  Exudative age-related macular degeneration of right eye with active choroidal neovascularization (HCC) Chronic active large subfoveal disciform scar with overlying fluid and intraretinal atrophy.  At 8-week interval today controlled.  We will repeat injection today and extend interval examination as no enlargement is occurring.  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 The patient advised to maintain good blood glucose control, excellent blood pressure control, and favorable levels of cholesterol, low density lipoprotein, and high density lipoproteins. Follow up in 1 year was recommended. Explained that fluctuations in visual acuity , or "out of focus", may result from large variations of blood sugar control.     ICD-10-CM   1. Exudative age-related macular degeneration of right eye with active choroidal neovascularization (HCC)  H35.3211 OCT, Retina - OU - Both Eyes    Intravitreal Injection, Pharmacologic Agent - OD - Right Eye    Bevacizumab (AVASTIN) SOLN 1.25 mg    2. Exudative age-related  macular degeneration of left eye with active choroidal neovascularization ([place]  H35.3221     3. 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 Diabetes mellitus without complication ([place])  M01.0       1.  OD with no change in acuity and with central disciform scar limiting acuity.  But no enlargement by OCT or clinical examination.  2.  OS also no sign of active or reactive or recurrent CNVM we will continue to monitor and observe  3.  Repeat injection Avastin OD today and examination next in 3 months OU  Continue blood sugar control monitoring important  Ophthalmic Meds Ordered this visit:  Meds ordered this encounter  Medications   Bevacizumab (AVASTIN) SOLN 1.25 mg       Return in about 3 months (around 06/06/2022) for DILATE OU, AVASTIN OCT, OD.  There are no Patient Instructions on file for this visit.   Explained the diagnoses, plan, and follow up with the patient and they expressed understanding.  Patient expressed understanding of the importance of proper follow up care.   Clent Demark Wrenly Lauritsen M.D. Diseases & Surgery of the Retina and Vitreous [facility]ina [facility] Diabetic [place] 03/07/22     Abbreviations: M myopia (nearsighted); A astigmatism; H hyperopia (farsighted); P presbyopia; Mrx spectacle prescription;  CTL contact lenses; OD right eye; OS left eye; OU both eyes  XT exotropia; ET esotropia; PEK punctate epithelial keratitis; PEE punctate epithelial erosions; DES dry eye syndrome; MGD meibomian gland dysfunction; ATs artificial tears; PFAT's preservative free artificial tears; [place] nuclear sclerotic cataract; PSC posterior subcapsular cataract; ERM epi-retinal membrane; PVD posterior vitreous detachment; RD retinal detachment; DM diabetes mellitus; DR diabetic retinopathy; NPDR non-proliferative diabetic retinopathy; PDR proliferative diabetic retinopathy; CSME clinically significant macular edema; DME  diabetic macular edema; dbh dot blot hemorrhages; CWS cotton wool spot; POAG primary open angle glaucoma; C/D cup-to-disc ratio; HVF humphrey visual field; GVF goldmann visual field; OCT optical coherence tomography; IOP intraocular pressure; BRVO Branch retinal vein occlusion; CRVO central retinal vein occlusion; CRAO central retinal artery occlusion; BRAO branch retinal artery occlusion; RT retinal tear; SB scleral buckle; PPV pars plana vitrectomy; VH Vitreous hemorrhage; PRP panretinal laser photocoagulation; IVK intravitreal kenalog; VMT vitreomacular traction; MH Macular hole;  NVD neovascularization of the disc; NVE neovascularization elsewhere; AREDS age related eye disease study; ARMD age related macular degeneration; POAG primary open angle glaucoma; EBMD epithelial/anterior basement membrane dystrophy; ACIOL anterior chamber intraocular lens; IOL intraocular lens; PCIOL posterior chamber intraocular lens; Phaco/IOL phacoemulsification with intraocular lens placement; [place] photorefractive keratectomy; LASIK laser assisted in situ keratomileusis; HTN hypertension; DM diabetes mellitus; COPD chronic obstructive pulmonary disease

## 2022-03-07 NOTE — Assessment & Plan Note (Signed)
No sign of CNVM active OS

## 2022-03-08 ENCOUNTER — Telehealth (HOSPITAL_COMMUNITY): Payer: Self-pay | Admitting: Adult Health

## 2022-03-08 ENCOUNTER — Ambulatory Visit (HOSPITAL_COMMUNITY)
Admission: RE | Admit: 2022-03-08 | Discharge: 2022-03-08 | Disposition: A | Discharge status: Home / Self Care | Payer: Medicare HMO | Source: Ambulatory Visit | Attending: Internal Medicine | Admitting: Internal Medicine

## 2022-03-08 DIAGNOSIS — I5022 Chronic systolic (congestive) heart failure: Secondary | ICD-10-CM | POA: Diagnosis not present

## 2022-03-08 LAB — BASIC METABOLIC PANEL
Anion gap: 7 (ref 5–15)
BUN: 32 mg/dL — ABNORMAL HIGH (ref 8–23)
CO2: 30 mmol/L (ref 22–32)
Calcium: 8 mg/dL — ABNORMAL LOW (ref 8.9–10.3)
Chloride: 104 mmol/L (ref 98–111)
Creatinine, Ser: 1.54 mg/dL — ABNORMAL HIGH (ref 0.61–1.24)
GFR, Estimated: 48 mL/min — ABNORMAL LOW (ref >60)
Glucose, Bld: 154 mg/dL — ABNORMAL HIGH (ref 70–99)
Potassium: 3.9 mmol/L (ref 3.5–5.1)
Sodium: 141 mmol/L (ref 135–145)

## 2022-03-08 NOTE — Telephone Encounter (Signed)
LMOM

## 2022-03-08 NOTE — Telephone Encounter (Signed)
I called and left a message.   Cardiomems Remote Monitoring  S/P Cardiomems Implant   PAD Goal: 21 Most recent reading: 33  Recommended changes: Increase torsemide 60 mg twice a day. BMET 7 days after increased dose.   I continue to review and analyze the patients PA pressures weekly (and more often as needed) to bring PA pressures within the optimal range.    Kariann Wecker NP-C  3:06 PM

## 2022-03-22 ENCOUNTER — Telehealth (HOSPITAL_COMMUNITY): Payer: Self-pay | Admitting: Adult Health

## 2022-03-22 DIAGNOSIS — I5022 Chronic systolic (congestive) heart failure: Secondary | ICD-10-CM

## 2022-03-22 NOTE — Telephone Encounter (Signed)
Pt aware via wife  Repeat labs 9/28 and fu 10/18

## 2022-03-22 NOTE — Telephone Encounter (Signed)
   Cardiomems Remote Monitoring  S/P Cardiomems Implant   PAD Goal: 21 Most recent reading: 33  Recommended changes: Please call. Cardiomems reading suggestive of fluid accumulation. Please call instruct to increase 80 mg twice a day x 2 days then back to 60 mg twice a day.   Needs BMET in 7 days. Please set up for f/u in HF clinic.   I continue to review and analyze the patients PA pressures weekly (and more often as needed) to bring PA pressures within the optimal range.    Viha Kriegel NP-C  2:42 PM

## 2022-03-22 NOTE — Addendum Note (Signed)
Addended by: Kerry Dory on: 03/22/2022 04:10 PM   Modules accepted: Orders

## 2022-03-27 ENCOUNTER — Telehealth (HOSPITAL_COMMUNITY): Payer: Self-pay | Admitting: Adult Health

## 2022-03-27 NOTE — Telephone Encounter (Signed)
Please call.    Cardiomems Remote Monitoring  S/P Cardiomems Implant   PAD Goal: 21 Most recent reading: 37 Suggestive of fluid overload.   Recommended changes: Needs to increase torsemide to 80 mg twice a day.    I continue to review and analyze the patients PA pressures weekly (and more often as needed) to bring PA pressures within the optimal range.     Jagar Lua NP-C   4:44 PM

## 2022-03-28 ENCOUNTER — Ambulatory Visit (HOSPITAL_COMMUNITY)
Admission: RE | Admit: 2022-03-28 | Discharge: 2022-03-28 | Disposition: A | Discharge status: Home / Self Care | Payer: Medicare HMO | Source: Ambulatory Visit | Attending: Internal Medicine | Admitting: Internal Medicine

## 2022-03-28 DIAGNOSIS — I5022 Chronic systolic (congestive) heart failure: Secondary | ICD-10-CM | POA: Diagnosis not present

## 2022-03-28 LAB — BASIC METABOLIC PANEL
Anion gap: 8 (ref 5–15)
BUN: 35 mg/dL — ABNORMAL HIGH (ref 8–23)
CO2: 31 mmol/L (ref 22–32)
Calcium: 8.4 mg/dL — ABNORMAL LOW (ref 8.9–10.3)
Chloride: 102 mmol/L (ref 98–111)
Creatinine, Ser: 1.54 mg/dL — ABNORMAL HIGH (ref 0.61–1.24)
GFR, Estimated: 48 mL/min — ABNORMAL LOW (ref >60)
Glucose, Bld: 159 mg/dL — ABNORMAL HIGH (ref 70–99)
Potassium: 4 mmol/L (ref 3.5–5.1)
Sodium: 141 mmol/L (ref 135–145)

## 2022-04-03 MED ORDER — TORSEMIDE 20 MG PO TABS: 80.0000 mg | ORAL_TABLET | Freq: Two times a day (BID) | ORAL | 3 refills | Status: DC

## 2022-04-03 NOTE — Addendum Note (Signed)
Addended by: Kerry Dory on: 04/03/2022 10:23 AM   Modules accepted: Orders

## 2022-04-03 NOTE — Telephone Encounter (Signed)
Pt aware via wife 

## 2022-04-11 ENCOUNTER — Telehealth (HOSPITAL_COMMUNITY): Payer: Self-pay | Admitting: Adult Health

## 2022-04-11 MED ORDER — METOLAZONE 2.5 MG PO TABS: 2.5000 mg | ORAL_TABLET | ORAL | 3 refills | Status: DC

## 2022-04-11 NOTE — Telephone Encounter (Signed)
  Cardiomems Remote Monitoring  S/P Cardiomems Implant   PAD Goal: 21 Most recent reading: 36  Recommended changes: Elevated. Needs to continue lasix 80 mg twice a day . Add metolazone 2.5 mg once weekly.  BMET next week. Please call.   I continue to review and analyze the patients PA pressures weekly (and more often as needed) to bring PA pressures within the optimal range.    Teva Bronkema NP-C  12:23 PM

## 2022-04-11 NOTE — Telephone Encounter (Signed)
Pt aware via wife  Detailed instructions left on home voicemail as requested

## 2022-04-11 NOTE — Addendum Note (Signed)
Addended by: Jaidon Sponsel, Sharlot Gowda on: 04/11/2022 04:16 PM   Modules accepted: Orders

## 2022-04-18 ENCOUNTER — Encounter (HOSPITAL_COMMUNITY): Payer: Self-pay

## 2022-04-18 ENCOUNTER — Ambulatory Visit (HOSPITAL_COMMUNITY)
Admission: RE | Admit: 2022-04-18 | Discharge: 2022-04-18 | Disposition: A | Discharge status: Home / Self Care | Payer: Medicare HMO | Source: Ambulatory Visit | Attending: Family Medicine | Admitting: Family Medicine

## 2022-04-18 VITALS — BP 122/60 | HR 71 | Wt 198.8 lb

## 2022-04-18 DIAGNOSIS — M7989 Other specified soft tissue disorders: Secondary | ICD-10-CM | POA: Diagnosis not present

## 2022-04-18 DIAGNOSIS — N183 Chronic kidney disease, stage 3 unspecified: Secondary | ICD-10-CM | POA: Insufficient documentation

## 2022-04-18 DIAGNOSIS — I447 Left bundle-branch block, unspecified: Secondary | ICD-10-CM | POA: Diagnosis not present

## 2022-04-18 DIAGNOSIS — Z7901 Long term (current) use of anticoagulants: Secondary | ICD-10-CM | POA: Diagnosis not present

## 2022-04-18 DIAGNOSIS — I5032 Chronic diastolic (congestive) heart failure: Secondary | ICD-10-CM | POA: Diagnosis present

## 2022-04-18 DIAGNOSIS — G4733 Obstructive sleep apnea (adult) (pediatric): Secondary | ICD-10-CM | POA: Diagnosis not present

## 2022-04-18 DIAGNOSIS — E11621 Type 2 diabetes mellitus with foot ulcer: Secondary | ICD-10-CM | POA: Insufficient documentation

## 2022-04-18 DIAGNOSIS — I714 Abdominal aortic aneurysm, without rupture, unspecified: Secondary | ICD-10-CM

## 2022-04-18 DIAGNOSIS — L97519 Non-pressure chronic ulcer of other part of right foot with unspecified severity: Secondary | ICD-10-CM

## 2022-04-18 DIAGNOSIS — E1122 Type 2 diabetes mellitus with diabetic chronic kidney disease: Secondary | ICD-10-CM | POA: Insufficient documentation

## 2022-04-18 DIAGNOSIS — I7121 Aneurysm of the ascending aorta, without rupture: Secondary | ICD-10-CM | POA: Insufficient documentation

## 2022-04-18 DIAGNOSIS — Z7984 Long term (current) use of oral hypoglycemic drugs: Secondary | ICD-10-CM | POA: Diagnosis not present

## 2022-04-18 DIAGNOSIS — I130 Hypertensive heart and chronic kidney disease with heart failure and stage 1 through stage 4 chronic kidney disease, or unspecified chronic kidney disease: Secondary | ICD-10-CM | POA: Insufficient documentation

## 2022-04-18 DIAGNOSIS — Z8674 Personal history of sudden cardiac arrest: Secondary | ICD-10-CM | POA: Insufficient documentation

## 2022-04-18 DIAGNOSIS — Z79899 Other long term (current) drug therapy: Secondary | ICD-10-CM | POA: Insufficient documentation

## 2022-04-18 LAB — BASIC METABOLIC PANEL
Anion gap: 13 (ref 5–15)
BUN: 35 mg/dL — ABNORMAL HIGH (ref 8–23)
CO2: 31 mmol/L (ref 22–32)
Calcium: 9.2 mg/dL (ref 8.9–10.3)
Chloride: 96 mmol/L — ABNORMAL LOW (ref 98–111)
Creatinine, Ser: 1.54 mg/dL — ABNORMAL HIGH (ref 0.61–1.24)
GFR, Estimated: 48 mL/min — ABNORMAL LOW (ref >60)
Glucose, Bld: 146 mg/dL — ABNORMAL HIGH (ref 70–99)
Potassium: 3.8 mmol/L (ref 3.5–5.1)
Sodium: 140 mmol/L (ref 135–145)

## 2022-04-18 LAB — BRAIN NATRIURETIC PEPTIDE: B Natriuretic Peptide: 294.1 pg/mL — ABNORMAL HIGH (ref 0.0–100.0)

## 2022-04-18 MED ORDER — POTASSIUM CHLORIDE CRYS ER 20 MEQ PO TBCR: 40.0000 meq | EXTENDED_RELEASE_TABLET | Freq: Every day | ORAL | 6 refills | Status: DC

## 2022-04-18 NOTE — Patient Instructions (Addendum)
Good  to see you today! Please increase Torsemide 80 mg twice daily. Begin taking Potassium 40 meq daily  Return for appt with Dr Aundra Dubin in 6 months --our office will call you to schedule  If you have not heard from [place] by March please call to schedule [phone]   If you have any questions or concerns before your next appointment please send [place] a message through [place] or call our office at [phone].   Do the following things EVERYDAY: Weigh yourself in the morning before breakfast. Write it down and keep it in a log. Take your medicines as prescribed Eat low salt foods--Limit salt (sodium) to 2000 mg per day.  Stay as active as you can everyday Limit all fluids for the day to less than 2 liters   TO LEAVE A MESSAGE FOR THE NURSE SELECT OPTION 2, PLEASE LEAVE A MESSAGE INCLUDING: YOUR NAME DATE OF BIRTH CALL BACK NUMBER REASON FOR CALL**this is important as we prioritize the call backs  YOU WILL RECEIVE A CALL BACK THE SAME DAY AS LONG AS YOU CALL BEFORE 4:00 PM At the [facility], you and your health needs are our priority. As part of our continuing mission to provide you with exceptional heart care, we have created designated Provider Care Teams. These Care Teams include your primary Cardiologist (physician) and Advanced Practice Providers (APPs- Physician Assistants and Nurse Practitioners) who all work together to provide you with the care you need, when you need it.   You may see any of the following providers on your designated Care Team at your next follow up: Dr Glori Bickers Dr Loralie Champagne Dr. Roxana Hires, NP Lyda Jester, [place] [facility] [place], [place] Forestine Na, NP Audry Riles, PharmD   Please be sure to bring in all your medications bottles to every appointment.

## 2022-04-18 NOTE — Progress Notes (Signed)
Advanced Heart Failure Clinic Note  HF Cardiology: Dr. Aundra Dubin  Reason for Visit: Follow up for Diastolic Heart Failure   71 y.o. with history of CHF, [name], [name] for followup of CHF.  He has a history of mild LV systolic dysfunction (EF 86-75% range) with probably significant RV failure.  Patient was admitted in 4/20 with hypoxic respiratory failure due to PNA and decompensated HF.  Hospitalization was complicated by PEA arrest and AKI.   In 6/20, he was again admitted with hypoxemic respiratory failure with hypertensive crisis, CHF, and PNA.    Due to frequent hospital admission for CHF, underwent Cardiomems implantation 11/20.  RHC at that time showed optimized filling pressures after adjustment of torsemide. Outpatient sleep study found to have severe OSA and is now using Bipap. He was also referred to HF paramedicine program.   Echo 6/21 showed EF 45-50%, grade I DD, RV normal, mild to moderate TR.   Last seen 01/2020. Discharged from paramedicine 9/21 after demonstrating competency managing his medications. Unfortunately lost to follow up but has been using Cardiomems regularly.  Today he returns for HF follow up with his wife. Overall feeling fine. He has dyspnea with walking further distances on flat ground and + bendopnea. Has occasional sharp chest pressure, resolves with TUMs use. Has some leg swelling. Has R medial ankle wound being addressed by podiatry. Patient says it is healing. Denies palpitations, abnormal bleeding, dizziness, or PND/Orthopnea. Appetite ok. No fever or chills. He does not weigh at home. Only take torsemide 40/20. Wears Bipap nightly.  ECG (personally reviewed): NSR, LBBB QRS 164 msec  Cardiomems PAD goal 21: Today's reading is 31 mm hg.   Labs (4/20): LDL 91 Labs (9/20): K 4, creatinine 1.95 => 2.1 Labs (10/20): K 4.3, creatinine 1.8 Labs (11/20): K 4.9, creatinine 1.73 Labs (2/21): K 4.1, creatinine 1.9 Labs (3/21): K 3.9, creatinine  1.75 Labs (9/23): K 4.0, creatinine 1.54  PMH: 1. HTN 2. [name] stage 3 3. Type 2 diabetes 4. Hyperlipidemia 5. COPD: Suspected 6. PNA: Recurrent, suspected aspiration.  7. PEA arrest 4/20: Respiratory arrest.  8. LBBB 9. TIA 10. Ascending aortic aneurysm: CT chest in 6/20 with 5.1 cm ascending aortic aneurysm. 11. 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  - Echo (12/19): EF 45-50%.  - Echo (4/20): EF 45-50%, diffuse hypokinesis, normal RV.  - Cardiomems placement (11/20). - RHC (11/20): mean RA 2, PA 33/8, mean PCWP 11, CI 3.32 - Echo (6/21): EF 45-50%, grade I DD, RV normal, mild to moderate TR 12. Ventricular ectopy: 8/20 monitor showed PVCs and NSVT.  13. Peripheral arterial dopplers (10/20): No significant stenosis.   14. OSA: Bipap  Social History   Socioeconomic History   Marital status: Married    Spouse name: Not on file   Number of children: Not on file   Years of education: Not on file   Highest education level: Not on file  Occupational History   Not on file  Tobacco Use   Smoking status: Never   Smokeless tobacco: Never  Vaping Use   Vaping Use: Never used  Substance and Sexual Activity   Alcohol use: No   Drug use: No   Sexual activity: Not Currently    Partners: Female    Birth control/protection: None  Other Topics Concern   Not on file  Social History Narrative   Patient lives in private residence with supportive spouse   Social Determinants of Health  Financial Resource Strain: Low Risk  (10/15/2017)   Overall Financial Resource Strain (CARDIA)    Difficulty of Paying Living Expenses: Not very hard  Food Insecurity: No Food Insecurity (10/15/2017)   Hunger Vital Sign    Worried About Running Out of Food in the Last Year: Never true    [place] in the Last Year: Never true  Transportation Needs: Unknown (10/15/2017)   PRAPARE - Transportation    Lack of Transportation (Medical): Patient refused    Lack of  Transportation (Non-Medical): Patient refused  Physical Activity: Inactive (10/15/2017)   Exercise Vital Sign    Days of Exercise per Week: 0 days    Minutes of Exercise per Session: 0 min  Stress: No Stress Concern Present (10/15/2017)   [place] of Stress : Not at all  Social Connections: Unknown (10/15/2017)   Social Connection and Isolation Panel [NHANES]    Frequency of Communication with Friends and Family: Patient refused    Frequency of Social Gatherings with Friends and Family: Patient refused    Attends Religious Services: Patient refused    Active Member of Clubs or Organizations: Patient refused    Attends Archivist Meetings: Patient refused    Marital Status: Patient refused  Intimate Partner Violence: Not At Risk (10/15/2017)   Humiliation, Afraid, Rape, and Kick questionnaire    Fear of Current or Ex-Partner: No    Emotionally Abused: No    Physically Abused: No    Sexually Abused: No   Family History  Problem Relation Age of Onset   Emphysema Mother    Aneurysm Mother    Emphysema Father    Coronary artery disease Brother    Coronary artery disease Brother    Coronary artery disease Sister    ROS: All systems reviewed and negative except as per HPI.   Current Outpatient Medications  Medication Sig Dispense Refill   Accu-Chek FastClix Lancets MISC USE UTD     ACCU-CHEK GUIDE test strip U TO TEST BLOOD SUGAR QD     atorvastatin (LIPITOR) 40 MG tablet Take 1 tablet (40 mg total) by mouth every evening. 30 tablet 11   Blood Glucose Monitoring Suppl (ACCU-CHEK GUIDE) w/Device KIT U TO TEST BLOOD SUGAR QD     carvedilol (COREG) 12.5 MG tablet TAKE ONE TABLET BY MOUTH TWICE A DAY 180 tablet 3   fenofibrate (TRICOR) 48 MG tablet Take 48 mg by mouth daily.     finasteride (PROSCAR) 5 MG tablet Take 5 mg by mouth daily.     gabapentin (NEURONTIN) 400 MG capsule Take 400 mg by mouth 2  (two) times daily.     metFORMIN (GLUCOPHAGE) 1000 MG tablet Take 0.5 tablets (500 mg total) by mouth 2 (two) times daily with a meal. 30 tablet 3   metolazone (ZAROXOLYN) 2.5 MG tablet Take 1 tablet (2.5 mg total) by mouth once a week. On Thursday 5 tablet 3   montelukast (SINGULAIR) 10 MG tablet Take 10 mg by mouth daily.      Multiple Vitamin (MULTIVITAMIN) tablet Take 1 tablet by mouth daily.     Multiple Vitamins-Minerals (PRESERVISION AREDS PO) Take by mouth.     pantoprazole (PROTONIX) 40 MG tablet Take 1 tablet (40 mg total) by mouth daily. 30 tablet 1   SYMBICORT 80-4.5 MCG/ACT inhaler Inhale 2 puffs into the lungs daily as needed (wheezing).     tamsulosin (FLOMAX) 0.4 MG CAPS  capsule Take 0.4 mg by mouth daily.     torsemide (DEMADEX) 20 MG tablet Take 40 mg by mouth daily. 1 tablet in the evening     VENTOLIN HFA 108 (90 Base) MCG/ACT inhaler Inhale 2 puffs into the lungs every 6 (six) hours as needed for wheezing or shortness of breath.      potassium chloride SA (KLOR-CON) 20 MEQ tablet Take 20 mEq by mouth daily.  (Patient not taking: Reported on 04/18/2022)     No current facility-administered medications for this encounter.   BP 122/60   Pulse 71   Wt 90.2 kg (198 lb 12.8 oz)   SpO2 96%   BMI 31.14 kg/m    Wt Readings from Last 3 Encounters:  04/18/22 90.2 kg (198 lb 12.8 oz)  06/14/20 91.7 kg (202 lb 3.2 oz)  03/30/20 92.5 kg (204 lb)   Physical Exam  General:  NAD. No resp difficulty, walked into clinic HEENT: Normal Neck: Supple. JVP 9-10. Carotids 2+ bilat; no bruits. No lymphadenopathy or thryomegaly appreciated. Cor: PMI nondisplaced. Regular rate & rhythm. No rubs, gallops, 3/6 SEM  Lungs: Diminished throughout Abdomen: + distended, nontender, obese, No hepatosplenomegaly. No bruits or masses. Good bowel sounds. Extremities: No cyanosis, clubbing, rash, 2+ RLE edema, 1+ LLE; R medial ankle ulcer, no erythema/drainage Neuro: Alert & oriented x 3, cranial  nerves grossly intact. Moves all 4 extremities w/o difficulty. Affect pleasant.  Assessment/Plan: 1. Chronic primarily diastolic CHF: Suspect that there is also significant RV failure.  Echo 4/20 with EF 45-50%.  Cath in 9/12 showed nonobstructive coronary disease.  S/p Cardiomems implant 11/20. Echo (6/21): EF 45-50%, grade I DD, RV normal, mild to moderate TR. NYHA II-early III, volume overloaded on exam and by Cardiomems. - Increase torsemide to 80 mg bid + metolazone 2.5 mg weekly (has only been taking 40/20). BMET/BNP today, repeat in 10 days. - Add 40 KCL daily. - Continue Coreg 12.5 mg bid.  - Continue to follow Cardiomems. 2. [name]: Stage 3: BMET today. Follow closely with increase in diuresis. 3. OSA: On Bipap. Continue nightly.  4. Leg/foot ulcers: Peripheral arterial dopplers did not show significant PAD, suspect venous insufficiency. Ulcers are healing. - Follows with podiatry. 5 LBBB: Chronic.  6. Ascending aortic aneurysm: CT chest 6/20 with 5.1 cm ascending aortic aneurysm.   - With [name] stage 3, will obtain repeat CT chest w/o contrast to follow ascending aorta, schedule at next visit..    Follow up 6 week with APP and 6 months with Dr Aundra Dubin + echo.  Maricela Bo [facility] FNP-BC  04/18/2022

## 2022-04-25 ENCOUNTER — Ambulatory Visit (INDEPENDENT_AMBULATORY_CARE_PROVIDER_SITE_OTHER): Payer: Medicare HMO | Admitting: Podiatry

## 2022-04-25 ENCOUNTER — Encounter: Payer: Self-pay | Admitting: Podiatry

## 2022-04-25 DIAGNOSIS — I872 Venous insufficiency (chronic) (peripheral): Secondary | ICD-10-CM | POA: Diagnosis not present

## 2022-04-25 DIAGNOSIS — B351 Tinea unguium: Secondary | ICD-10-CM | POA: Diagnosis not present

## 2022-04-25 DIAGNOSIS — M205X1 Other deformities of toe(s) (acquired), right foot: Secondary | ICD-10-CM

## 2022-04-25 DIAGNOSIS — E119 Type 2 diabetes mellitus without complications: Secondary | ICD-10-CM

## 2022-04-25 DIAGNOSIS — M79675 Pain in left toe(s): Secondary | ICD-10-CM | POA: Diagnosis not present

## 2022-04-25 DIAGNOSIS — M205X2 Other deformities of toe(s) (acquired), left foot: Secondary | ICD-10-CM

## 2022-04-25 DIAGNOSIS — M79674 Pain in right toe(s): Secondary | ICD-10-CM

## 2022-04-25 NOTE — Progress Notes (Signed)
This patient returns to my office for at risk foot care.  This patient requires this care by a professional since this patient will be at risk due to having diabetes type 2 and chronic venous insufficiency.  This patient is unable to cut nails himself since the patient cannot reach his nails.These nails are painful walking and wearing shoes.  This patient presents for at risk foot care today.  General Appearance  Alert, conversant and in no acute stress.  Vascular  Dorsalis pedis and posterior tibial  pulses are not  palpable  bilaterally.  Capillary return is within normal limits  bilaterally. Temperature is within normal limits  bilaterally.  Neurologic  Senn-Weinstein monofilament wire test diminished/absent  bilaterally. Muscle power within normal limits bilaterally.  Nails Thick disfigured discolored nails with subungual debris  from hallux to fifth toes bilaterally. No evidence of bacterial infection or drainage bilaterally.  Orthopedic  No limitations of motion  feet .  No crepitus or effusions noted.  No bony pathology or digital deformities noted.  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  Midfoot  DJD  B/L.  Limited rearfoot  ROM  B/L.  Skin  normotropic skin with no porokeratosis noted bilaterally.  No signs of infections or ulcers noted.     Onychomycosis  Pain in right toes  Pain in left toes  Consent was obtained for treatment procedures.   Mechanical debridement of nails 1-5  bilaterally performed with a nail nipper.  Filed with dremel without incident.    Return office visit    3 months                  Told patient to return for periodic foot care and evaluation due to potential at risk complications.   Prabhav Faulkenberry DPM  

## 2022-05-02 ENCOUNTER — Ambulatory Visit (HOSPITAL_COMMUNITY)
Admission: RE | Admit: 2022-05-02 | Discharge: 2022-05-02 | Disposition: A | Discharge status: Home / Self Care | Payer: Medicare HMO | Source: Ambulatory Visit | Attending: Internal Medicine | Admitting: Internal Medicine

## 2022-05-02 DIAGNOSIS — I5032 Chronic diastolic (congestive) heart failure: Secondary | ICD-10-CM | POA: Diagnosis present

## 2022-05-02 LAB — BASIC METABOLIC PANEL
Anion gap: 11 (ref 5–15)
BUN: 60 mg/dL — ABNORMAL HIGH (ref 8–23)
CO2: 34 mmol/L — ABNORMAL HIGH (ref 22–32)
Calcium: 9.1 mg/dL (ref 8.9–10.3)
Chloride: 96 mmol/L — ABNORMAL LOW (ref 98–111)
Creatinine, Ser: 1.87 mg/dL — ABNORMAL HIGH (ref 0.61–1.24)
GFR, Estimated: 38 mL/min — ABNORMAL LOW (ref >60)
Glucose, Bld: 168 mg/dL — ABNORMAL HIGH (ref 70–99)
Potassium: 3.7 mmol/L (ref 3.5–5.1)
Sodium: 141 mmol/L (ref 135–145)

## 2022-05-18 ENCOUNTER — Ambulatory Visit (HOSPITAL_COMMUNITY)
Admission: RE | Admit: 2022-05-18 | Discharge: 2022-05-18 | Disposition: A | Discharge status: Home / Self Care | Payer: Medicare HMO | Source: Ambulatory Visit | Attending: Cardiology | Admitting: Cardiology

## 2022-05-18 ENCOUNTER — Telehealth (HOSPITAL_COMMUNITY): Payer: Self-pay

## 2022-05-18 ENCOUNTER — Other Ambulatory Visit (HOSPITAL_COMMUNITY): Payer: Self-pay

## 2022-05-18 DIAGNOSIS — I5032 Chronic diastolic (congestive) heart failure: Secondary | ICD-10-CM | POA: Insufficient documentation

## 2022-05-18 NOTE — Telephone Encounter (Signed)
Kim called and said he has no orders. Can you follow up on? She asked for [facility] and gave no other information.

## 2022-05-29 NOTE — Progress Notes (Signed)
Advanced Heart Failure Clinic Note  HF Cardiology: Dr. Aundra Dubin  Reason for Visit: Follow up for Diastolic Heart Failure   71 y.o. with history of CHF, [name], [name] for followup of CHF.  He has a history of mild LV systolic dysfunction (EF 86-75% range) with probably significant RV failure.  Patient was admitted in 4/20 with hypoxic respiratory failure due to PNA and decompensated HF.  Hospitalization was complicated by PEA arrest and AKI.   In 6/20, he was again admitted with hypoxemic respiratory failure with hypertensive crisis, CHF, and PNA.    Due to frequent hospital admission for CHF, underwent Cardiomems implantation 11/20.  RHC at that time showed optimized filling pressures after adjustment of torsemide. Outpatient sleep study found to have severe OSA and is now using Bipap. He was also referred to HF paramedicine program.   Echo 6/21 showed EF 45-50%, grade I DD, RV normal, mild to moderate TR.   Last seen 01/2020. Discharged from paramedicine 9/21 after demonstrating competency managing his medications. Unfortunately lost to follow up but has been using Cardiomems regularly.  Today he returns for HF follow up with his wife. Overall feeling fine. He has dyspnea with walking further distances on flat ground and + bendopnea. Has occasional sharp chest pressure, resolves with TUMs use. Has some leg swelling. Has R medial ankle wound being addressed by podiatry. Patient says it is healing. Denies palpitations, abnormal bleeding, dizziness, or PND/Orthopnea. Appetite ok. No fever or chills. He does not weigh at home. Only take torsemide 40/20. Wears Bipap nightly.  ECG (personally reviewed): NSR, LBBB QRS 164 msec  Cardiomems PAD goal 21: Today's reading is 31 mm hg.   Labs (4/20): LDL 91 Labs (9/20): K 4, creatinine 1.95 => 2.1 Labs (10/20): K 4.3, creatinine 1.8 Labs (11/20): K 4.9, creatinine 1.73 Labs (2/21): K 4.1, creatinine 1.9 Labs (3/21): K 3.9, creatinine  1.75 Labs (9/23): K 4.0, creatinine 1.54  PMH: 1. HTN 2. [name] stage 3 3. Type 2 diabetes 4. Hyperlipidemia 5. COPD: Suspected 6. PNA: Recurrent, suspected aspiration.  7. PEA arrest 4/20: Respiratory arrest.  8. LBBB 9. TIA 10. Ascending aortic aneurysm: CT chest in 6/20 with 5.1 cm ascending aortic aneurysm. 11. 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  - Echo (12/19): EF 45-50%.  - Echo (4/20): EF 45-50%, diffuse hypokinesis, normal RV.  - Cardiomems placement (11/20). - RHC (11/20): mean RA 2, PA 33/8, mean PCWP 11, CI 3.32 - Echo (6/21): EF 45-50%, grade I DD, RV normal, mild to moderate TR 12. Ventricular ectopy: 8/20 monitor showed PVCs and NSVT.  13. Peripheral arterial dopplers (10/20): No significant stenosis.   14. OSA: Bipap  Social History   Socioeconomic History   Marital status: Married    Spouse name: Not on file   Number of children: Not on file   Years of education: Not on file   Highest education level: Not on file  Occupational History   Not on file  Tobacco Use   Smoking status: Never   Smokeless tobacco: Never  Vaping Use   Vaping Use: Never used  Substance and Sexual Activity   Alcohol use: No   Drug use: No   Sexual activity: Not Currently    Partners: Female    Birth control/protection: None  Other Topics Concern   Not on file  Social History Narrative   Patient lives in private residence with supportive spouse   Social Determinants of Health  Financial Resource Strain: Low Risk  (10/15/2017)   Overall Financial Resource Strain (CARDIA)    Difficulty of Paying Living Expenses: Not very hard  Food Insecurity: No Food Insecurity (10/15/2017)   Hunger Vital Sign    Worried About Running Out of Food in the Last Year: Never true    [place] in the Last Year: Never true  Transportation Needs: Unknown (10/15/2017)   PRAPARE - Transportation    Lack of Transportation (Medical): Patient refused    Lack of  Transportation (Non-Medical): Patient refused  Physical Activity: Inactive (10/15/2017)   Exercise Vital Sign    Days of Exercise per Week: 0 days    Minutes of Exercise per Session: 0 min  Stress: No Stress Concern Present (10/15/2017)   [place] of Stress : Not at all  Social Connections: Unknown (10/15/2017)   Social Connection and Isolation Panel [NHANES]    Frequency of Communication with Friends and Family: Patient refused    Frequency of Social Gatherings with Friends and Family: Patient refused    Attends Religious Services: Patient refused    Active Member of Clubs or Organizations: Patient refused    Attends Archivist Meetings: Patient refused    Marital Status: Patient refused  Intimate Partner Violence: Not At Risk (10/15/2017)   Humiliation, Afraid, Rape, and Kick questionnaire    Fear of Current or Ex-Partner: No    Emotionally Abused: No    Physically Abused: No    Sexually Abused: No   Family History  Problem Relation Age of Onset   Emphysema Mother    Aneurysm Mother    Emphysema Father    Coronary artery disease Brother    Coronary artery disease Brother    Coronary artery disease Sister    ROS: All systems reviewed and negative except as per HPI.   Current Outpatient Medications  Medication Sig Dispense Refill   Accu-Chek FastClix Lancets MISC USE UTD     ACCU-CHEK GUIDE test strip U TO TEST BLOOD SUGAR QD     atorvastatin (LIPITOR) 40 MG tablet Take 1 tablet (40 mg total) by mouth every evening. 30 tablet 11   Blood Glucose Monitoring Suppl (ACCU-CHEK GUIDE) w/Device KIT U TO TEST BLOOD SUGAR QD     carvedilol (COREG) 12.5 MG tablet TAKE ONE TABLET BY MOUTH TWICE A DAY 180 tablet 3   fenofibrate (TRICOR) 48 MG tablet Take 48 mg by mouth daily.     finasteride (PROSCAR) 5 MG tablet Take 5 mg by mouth daily.     gabapentin (NEURONTIN) 400 MG capsule Take 400 mg by mouth 2  (two) times daily.     metFORMIN (GLUCOPHAGE) 1000 MG tablet Take 0.5 tablets (500 mg total) by mouth 2 (two) times daily with a meal. 30 tablet 3   metolazone (ZAROXOLYN) 2.5 MG tablet Take 1 tablet (2.5 mg total) by mouth once a week. On Thursday 5 tablet 3   montelukast (SINGULAIR) 10 MG tablet Take 10 mg by mouth daily.      Multiple Vitamin (MULTIVITAMIN) tablet Take 1 tablet by mouth daily.     Multiple Vitamins-Minerals (PRESERVISION AREDS PO) Take by mouth.     pantoprazole (PROTONIX) 40 MG tablet Take 1 tablet (40 mg total) by mouth daily. 30 tablet 1   potassium chloride SA (KLOR-CON M) 20 MEQ tablet Take 2 tablets (40 mEq total) by mouth daily. 60 tablet 6   SYMBICORT  80-4.5 MCG/ACT inhaler Inhale 2 puffs into the lungs daily as needed (wheezing).     tamsulosin (FLOMAX) 0.4 MG CAPS capsule Take 0.4 mg by mouth daily.     torsemide (DEMADEX) 20 MG tablet Take 80 mg by mouth 2 (two) times daily.     VENTOLIN HFA 108 (90 Base) MCG/ACT inhaler Inhale 2 puffs into the lungs every 6 (six) hours as needed for wheezing or shortness of breath.      No current facility-administered medications for this visit.   There were no vitals taken for this visit.   Wt Readings from Last 3 Encounters:  04/18/22 90.2 kg (198 lb 12.8 oz)  06/14/20 91.7 kg (202 lb 3.2 oz)  03/30/20 92.5 kg (204 lb)   Physical Exam  General:  NAD. No resp difficulty, walked into clinic HEENT: Normal Neck: Supple. JVP 9-10. Carotids 2+ bilat; no bruits. No lymphadenopathy or thryomegaly appreciated. Cor: PMI nondisplaced. Regular rate & rhythm. No rubs, gallops, 3/6 SEM  Lungs: Diminished throughout Abdomen: + distended, nontender, obese, No hepatosplenomegaly. No bruits or masses. Good bowel sounds. Extremities: No cyanosis, clubbing, rash, 2+ RLE edema, 1+ LLE; R medial ankle ulcer, no erythema/drainage Neuro: Alert & oriented x 3, cranial nerves grossly intact. Moves all 4 extremities w/o difficulty. Affect  pleasant.  Assessment/Plan: 1. Chronic primarily diastolic CHF: Suspect that there is also significant RV failure.  Echo 4/20 with EF 45-50%.  Cath in 9/12 showed nonobstructive coronary disease.  S/p Cardiomems implant 11/20. Echo (6/21): EF 45-50%, grade I DD, RV normal, mild to moderate TR. NYHA II-early III, volume overloaded on exam and by Cardiomems. - Increase torsemide to 80 mg bid + metolazone 2.5 mg weekly (has only been taking 40/20). BMET/BNP today, repeat in 10 days. - Add 40 KCL daily. - Continue Coreg 12.5 mg bid.  - Continue to follow Cardiomems. 2. [name]: Stage 3: BMET today. Follow closely with increase in diuresis. 3. OSA: On Bipap. Continue nightly.  4. Leg/foot ulcers: Peripheral arterial dopplers did not show significant PAD, suspect venous insufficiency. Ulcers are healing. - Follows with podiatry. 5 LBBB: Chronic.  6. Ascending aortic aneurysm: CT chest 6/20 with 5.1 cm ascending aortic aneurysm.   - With [name] stage 3, will obtain repeat CT chest w/o contrast to follow ascending aorta, schedule at next visit..    Follow up 6 week with APP and 6 months with Dr Aundra Dubin + echo.  Maricela Bo [facility] FNP-BC  05/29/2022

## 2022-05-30 ENCOUNTER — Encounter (HOSPITAL_COMMUNITY): Payer: Medicare HMO

## 2022-05-30 ENCOUNTER — Ambulatory Visit (HOSPITAL_COMMUNITY)
Admission: RE | Admit: 2022-05-30 | Discharge: 2022-05-30 | Disposition: A | Discharge status: Home / Self Care | Payer: Medicare HMO | Source: Ambulatory Visit | Attending: Family Medicine | Admitting: Family Medicine

## 2022-05-30 ENCOUNTER — Encounter (HOSPITAL_COMMUNITY): Payer: Self-pay

## 2022-05-30 ENCOUNTER — Other Ambulatory Visit (HOSPITAL_COMMUNITY): Payer: Self-pay

## 2022-05-30 VITALS — BP 118/68 | HR 74 | Wt 192.6 lb

## 2022-05-30 DIAGNOSIS — Z8249 Family history of ischemic heart disease and other diseases of the circulatory system: Secondary | ICD-10-CM | POA: Diagnosis not present

## 2022-05-30 DIAGNOSIS — I447 Left bundle-branch block, unspecified: Secondary | ICD-10-CM

## 2022-05-30 DIAGNOSIS — I714 Abdominal aortic aneurysm, without rupture, unspecified: Secondary | ICD-10-CM

## 2022-05-30 DIAGNOSIS — Z7984 Long term (current) use of oral hypoglycemic drugs: Secondary | ICD-10-CM | POA: Insufficient documentation

## 2022-05-30 DIAGNOSIS — N183 Chronic kidney disease, stage 3 unspecified: Secondary | ICD-10-CM

## 2022-05-30 DIAGNOSIS — I7121 Aneurysm of the ascending aorta, without rupture: Secondary | ICD-10-CM | POA: Insufficient documentation

## 2022-05-30 DIAGNOSIS — I872 Venous insufficiency (chronic) (peripheral): Secondary | ICD-10-CM

## 2022-05-30 DIAGNOSIS — I130 Hypertensive heart and chronic kidney disease with heart failure and stage 1 through stage 4 chronic kidney disease, or unspecified chronic kidney disease: Secondary | ICD-10-CM | POA: Diagnosis not present

## 2022-05-30 DIAGNOSIS — I5032 Chronic diastolic (congestive) heart failure: Secondary | ICD-10-CM | POA: Diagnosis present

## 2022-05-30 DIAGNOSIS — G4733 Obstructive sleep apnea (adult) (pediatric): Secondary | ICD-10-CM | POA: Diagnosis not present

## 2022-05-30 DIAGNOSIS — Z79899 Other long term (current) drug therapy: Secondary | ICD-10-CM | POA: Diagnosis not present

## 2022-05-30 DIAGNOSIS — Z8674 Personal history of sudden cardiac arrest: Secondary | ICD-10-CM | POA: Diagnosis not present

## 2022-05-30 DIAGNOSIS — E1122 Type 2 diabetes mellitus with diabetic chronic kidney disease: Secondary | ICD-10-CM | POA: Diagnosis not present

## 2022-05-30 LAB — BASIC METABOLIC PANEL
Anion gap: 12 (ref 5–15)
BUN: 59 mg/dL — ABNORMAL HIGH (ref 8–23)
CO2: 32 mmol/L (ref 22–32)
Calcium: 9 mg/dL (ref 8.9–10.3)
Chloride: 94 mmol/L — ABNORMAL LOW (ref 98–111)
Creatinine, Ser: 1.95 mg/dL — ABNORMAL HIGH (ref 0.61–1.24)
GFR, Estimated: 36 mL/min — ABNORMAL LOW (ref >60)
Glucose, Bld: 158 mg/dL — ABNORMAL HIGH (ref 70–99)
Potassium: 3.5 mmol/L (ref 3.5–5.1)
Sodium: 138 mmol/L (ref 135–145)

## 2022-05-30 LAB — BRAIN NATRIURETIC PEPTIDE: B Natriuretic Peptide: 220.9 pg/mL — ABNORMAL HIGH (ref 0.0–100.0)

## 2022-05-30 MED ORDER — EMPAGLIFLOZIN 10 MG PO TABS: 10.0000 mg | ORAL_TABLET | Freq: Every day | ORAL | 2 refills | Status: DC

## 2022-05-30 NOTE — Patient Instructions (Addendum)
Thank you for coming in today   Labs were done today, if any labs are abnormal the clinic will call you No news is good news  START Jardiance 10 mg 1 tablet daily if you get a urinary issues with taking this medication please call the clinic   You have been ordered a CT of the chest and will be contacted for scheduling  Your physician recommends that you schedule a follow-up appointment in:  Keep follow up with Dr. Shirlee Latch    Do the following things EVERYDAY: Weigh yourself in the morning before breakfast. Write it down and keep it in a log. Take your medicines as prescribed Eat low salt foods--Limit salt (sodium) to 2000 mg per day.  Stay as active as you can everyday Limit all fluids for the day to less than 2 liters  At the [facility] Clinic, you and your health needs are our priority. As part of our continuing mission to provide you with exceptional heart care, we have created designated Provider Care Teams. These Care Teams include your primary Cardiologist (physician) and Advanced Practice Providers (APPs- Physician Assistants and Nurse Practitioners) who all work together to provide you with the care you need, when you need it.   You may see any of the following providers on your designated Care Team at your next follow up: Dr Arvilla Meres Dr Marca Ancona Dr. Marcos Eke, NP Robbie Lis, [place] [facility] [place], [place] Brynda Peon, NP Karle Plumber, PharmD   Please be sure to bring in all your medications bottles to every appointment.   If you have any questions or concerns before your next appointment please send [place] a message through [place] or call our office at [phone].    TO LEAVE A MESSAGE FOR THE NURSE SELECT OPTION 2, PLEASE LEAVE A MESSAGE INCLUDING: YOUR NAME DATE OF BIRTH CALL BACK NUMBER REASON FOR CALL**this is important as we prioritize the call backs  YOU WILL RECEIVE A CALL BACK THE SAME DAY AS LONG AS  YOU CALL BEFORE 4:00 PM

## 2022-05-31 LAB — HEMOGLOBIN A1C
Hgb A1c MFr Bld: 7.4 % — ABNORMAL HIGH (ref 4.8–5.6)
Mean Plasma Glucose: 166 mg/dL

## 2022-06-06 ENCOUNTER — Encounter (INDEPENDENT_AMBULATORY_CARE_PROVIDER_SITE_OTHER): Payer: Medicare HMO | Admitting: Ophthalmology

## 2022-06-12 ENCOUNTER — Telehealth (HOSPITAL_COMMUNITY): Payer: Self-pay | Admitting: *Deleted

## 2022-06-19 ENCOUNTER — Telehealth (HOSPITAL_COMMUNITY): Payer: Self-pay | Admitting: Vascular Surgery

## 2022-06-19 NOTE — Telephone Encounter (Signed)
LVM giving ct appt 12/27 @ 1030 asked pt to call back to confirm

## 2022-06-22 ENCOUNTER — Other Ambulatory Visit (HOSPITAL_COMMUNITY): Payer: Self-pay | Admitting: Adult Health

## 2022-06-27 ENCOUNTER — Ambulatory Visit (HOSPITAL_COMMUNITY)
Admission: RE | Admit: 2022-06-27 | Discharge: 2022-06-27 | Disposition: A | Discharge status: Home / Self Care | Payer: Medicare HMO | Source: Ambulatory Visit | Attending: Family Medicine | Admitting: Family Medicine

## 2022-06-27 DIAGNOSIS — I5032 Chronic diastolic (congestive) heart failure: Secondary | ICD-10-CM | POA: Diagnosis present

## 2022-07-09 ENCOUNTER — Encounter (HOSPITAL_COMMUNITY): Payer: Self-pay

## 2022-08-01 ENCOUNTER — Ambulatory Visit: Payer: Medicare HMO | Admitting: Podiatry

## 2022-08-15 ENCOUNTER — Encounter: Payer: Self-pay | Admitting: Podiatry

## 2022-08-15 ENCOUNTER — Ambulatory Visit (INDEPENDENT_AMBULATORY_CARE_PROVIDER_SITE_OTHER): Payer: Medicare HMO | Admitting: Podiatry

## 2022-08-15 DIAGNOSIS — B351 Tinea unguium: Secondary | ICD-10-CM

## 2022-08-15 DIAGNOSIS — M79674 Pain in right toe(s): Secondary | ICD-10-CM | POA: Diagnosis not present

## 2022-08-15 DIAGNOSIS — M79675 Pain in left toe(s): Secondary | ICD-10-CM

## 2022-08-15 DIAGNOSIS — E119 Type 2 diabetes mellitus without complications: Secondary | ICD-10-CM

## 2022-08-15 NOTE — Progress Notes (Signed)
This patient returns to my office for at risk foot care.  This patient requires this care by a professional since this patient will be at risk due to having diabetes type 2 and chronic venous insufficiency.  This patient is unable to cut nails himself since the patient cannot reach his nails.These nails are painful walking and wearing shoes.  This patient presents for at risk foot care today.  General Appearance  Alert, conversant and in no acute stress.  Vascular  Dorsalis pedis and posterior tibial  pulses are not  palpable  bilaterally.  Capillary return is within normal limits  bilaterally. Temperature is within normal limits  bilaterally.  Neurologic  Senn-Weinstein monofilament wire test diminished/absent  bilaterally. Muscle power within normal limits bilaterally.  Nails Thick disfigured discolored nails with subungual debris  from hallux to fifth toes bilaterally. No evidence of bacterial infection or drainage bilaterally.  Orthopedic  No limitations of motion  feet .  No crepitus or effusions noted.  No bony pathology or digital deformities noted.  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  Midfoot  DJD  B/L.  Limited rearfoot  ROM  B/L.  Skin  normotropic skin with no porokeratosis noted bilaterally.  No signs of infections or ulcers noted.     Onychomycosis  Pain in right toes  Pain in left toes  Consent was obtained for treatment procedures.   Mechanical debridement of nails 1-5  bilaterally performed with a nail nipper.  Filed with dremel without incident.    Return office visit    3 months                  Told patient to return for periodic foot care and evaluation due to potential at risk complications.   Gardiner Barefoot DPM

## 2022-08-30 ENCOUNTER — Ambulatory Visit (HOSPITAL_COMMUNITY)
Admission: RE | Admit: 2022-08-30 | Discharge: 2022-08-30 | Disposition: A | Discharge status: Home / Self Care | Payer: Medicare HMO | Source: Ambulatory Visit | Attending: Cardiology | Admitting: Cardiology

## 2022-08-30 ENCOUNTER — Encounter (HOSPITAL_COMMUNITY): Payer: Self-pay | Admitting: Cardiology

## 2022-08-30 VITALS — BP 110/64 | HR 71 | Wt 195.8 lb

## 2022-08-30 DIAGNOSIS — Z8673 Personal history of transient ischemic attack (TIA), and cerebral infarction without residual deficits: Secondary | ICD-10-CM | POA: Insufficient documentation

## 2022-08-30 DIAGNOSIS — I2510 Atherosclerotic heart disease of native coronary artery without angina pectoris: Secondary | ICD-10-CM | POA: Diagnosis not present

## 2022-08-30 DIAGNOSIS — Z961 Presence of intraocular lens: Secondary | ICD-10-CM | POA: Diagnosis not present

## 2022-08-30 DIAGNOSIS — N183 Chronic kidney disease, stage 3 unspecified: Secondary | ICD-10-CM

## 2022-08-30 DIAGNOSIS — I5022 Chronic systolic (congestive) heart failure: Secondary | ICD-10-CM | POA: Diagnosis not present

## 2022-08-30 DIAGNOSIS — Z7984 Long term (current) use of oral hypoglycemic drugs: Secondary | ICD-10-CM | POA: Insufficient documentation

## 2022-08-30 DIAGNOSIS — I130 Hypertensive heart and chronic kidney disease with heart failure and stage 1 through stage 4 chronic kidney disease, or unspecified chronic kidney disease: Secondary | ICD-10-CM | POA: Insufficient documentation

## 2022-08-30 DIAGNOSIS — Z8674 Personal history of sudden cardiac arrest: Secondary | ICD-10-CM | POA: Diagnosis not present

## 2022-08-30 DIAGNOSIS — E1122 Type 2 diabetes mellitus with diabetic chronic kidney disease: Secondary | ICD-10-CM | POA: Diagnosis not present

## 2022-08-30 DIAGNOSIS — I5032 Chronic diastolic (congestive) heart failure: Secondary | ICD-10-CM | POA: Diagnosis present

## 2022-08-30 DIAGNOSIS — E785 Hyperlipidemia, unspecified: Secondary | ICD-10-CM | POA: Insufficient documentation

## 2022-08-30 DIAGNOSIS — I7121 Aneurysm of the ascending aorta, without rupture: Secondary | ICD-10-CM | POA: Insufficient documentation

## 2022-08-30 DIAGNOSIS — Z79899 Other long term (current) drug therapy: Secondary | ICD-10-CM | POA: Insufficient documentation

## 2022-08-30 DIAGNOSIS — H353123 Nonexudative age-related macular degeneration, left eye, advanced atrophic without subfoveal involvement: Secondary | ICD-10-CM | POA: Diagnosis not present

## 2022-08-30 DIAGNOSIS — G4733 Obstructive sleep apnea (adult) (pediatric): Secondary | ICD-10-CM | POA: Insufficient documentation

## 2022-08-30 DIAGNOSIS — I447 Left bundle-branch block, unspecified: Secondary | ICD-10-CM | POA: Diagnosis not present

## 2022-08-30 LAB — LIPID PANEL
Cholesterol: 137 mg/dL (ref 0–200)
HDL: 43 mg/dL (ref >40)
LDL Cholesterol: 65 mg/dL (ref 0–99)
Total CHOL/HDL Ratio: 3.2 RATIO
Triglycerides: 147 mg/dL (ref <150)
VLDL: 29 mg/dL (ref 0–40)

## 2022-08-30 LAB — BASIC METABOLIC PANEL
Anion gap: 14 (ref 5–15)
BUN: 51 mg/dL — ABNORMAL HIGH (ref 8–23)
CO2: 32 mmol/L (ref 22–32)
Calcium: 8.6 mg/dL — ABNORMAL LOW (ref 8.9–10.3)
Chloride: 93 mmol/L — ABNORMAL LOW (ref 98–111)
Creatinine, Ser: 1.84 mg/dL — ABNORMAL HIGH (ref 0.61–1.24)
GFR, Estimated: 38 mL/min — ABNORMAL LOW (ref >60)
Glucose, Bld: 161 mg/dL — ABNORMAL HIGH (ref 70–99)
Potassium: 3.6 mmol/L (ref 3.5–5.1)
Sodium: 139 mmol/L (ref 135–145)

## 2022-08-30 LAB — BRAIN NATRIURETIC PEPTIDE: B Natriuretic Peptide: 184.2 pg/mL — ABNORMAL HIGH (ref 0.0–100.0)

## 2022-08-30 MED ORDER — SPIRONOLACTONE 25 MG PO TABS: 12.5000 mg | ORAL_TABLET | Freq: Every day | ORAL | 3 refills | Status: DC

## 2022-08-30 NOTE — Patient Instructions (Addendum)
START Spironolactone 12.5 mg daily.  Labs done today, your results will be available in MyChart, we will contact you for abnormal readings.  Repeat blood work in 10 days.  Non-Cardiac CT scanning, (CAT scanning), is a noninvasive, special x-ray that produces cross-sectional images of the body using x-rays and a computer. CT scans help physicians diagnose and treat medical conditions. For some CT exams, a contrast material is used to enhance visibility in the area of the body being studied. CT scans provide greater clarity and reveal more details than regular x-ray exams. ONCE APPROVED BY INSURANCE YOU WILL BE CALLED TO HAVE THE TEST ARRANGED.  Your physician has requested that you have an echocardiogram. Echocardiography is a painless test that uses sound waves to create images of your heart. It provides your doctor with information about the size and shape of your heart and how well your heart's chambers and valves are working. This procedure takes approximately one hour. There are no restrictions for this procedure. Please do NOT wear cologne, perfume, aftershave, or lotions (deodorant is allowed). Please arrive 15 minutes prior to your appointment time.  Your physician recommends that you schedule a follow-up appointment in: 4 months (July ) ** please call the office in May to arrange your follow up appointment. **   If you have any questions or concerns before your next appointment please send [place] a message through [place] or call our office at [phone].    TO LEAVE A MESSAGE FOR THE NURSE SELECT OPTION 2, PLEASE LEAVE A MESSAGE INCLUDING: YOUR NAME DATE OF BIRTH CALL BACK NUMBER REASON FOR CALL**this is important as we prioritize the call backs  YOU WILL RECEIVE A CALL BACK THE SAME DAY AS LONG AS YOU CALL BEFORE 4:00 PM  At the [facility], you and your health needs are our priority. As part of our continuing mission to provide you with exceptional heart care, we  have created designated Provider Care Teams. These Care Teams include your primary Cardiologist (physician) and Advanced Practice Providers (APPs- Physician Assistants and Nurse Practitioners) who all work together to provide you with the care you need, when you need it.   You may see any of the following providers on your designated Care Team at your next follow up: Dr Glori Bickers Dr Loralie Champagne Dr. Roxana Hires, NP Lyda Jester, [place] [facility] [place], [place] Forestine Na, NP Audry Riles, PharmD   Please be sure to bring in all your medications bottles to every appointment.    Thank you for choosing [facility]

## 2022-08-30 NOTE — Progress Notes (Signed)
Advanced Heart Failure Clinic Note  HF Cardiology: Dr. Aundra Dubin  Reason for Visit: Follow up for Diastolic Heart Failure   [name], [name], [name] for followup of CHF.  He has a history of mild LV systolic dysfunction (EF [id number]) with probably significant RV failure.  Patient was admitted in 4/20 with hypoxic respiratory failure due to PNA and decompensated HF.  Hospitalization was complicated by PEA arrest and AKI.   In 6/20, he was again admitted with hypoxemic respiratory failure with hypertensive crisis, CHF, and PNA.    Due to frequent hospital admission for CHF, underwent Cardiomems implantation 11/20.  RHC at that time showed optimized filling pressures after adjustment of torsemide. Outpatient sleep study found to have severe OSA and is now using Bipap. He was also referred to HF paramedicine program.   Echo 6/21 showed EF 45-50%, grade I DD, RV normal, mild to moderate TR.   Last seen 01/2020. Discharged from paramedicine 9/21 after demonstrating competency managing his medications. Unfortunately lost to follow up but has been using Cardiomems regularly.  Follow up 10/23, we had not seen him since 01/2020. NYHA II-early III symptoms and volume overloaded. Torsemide increased to 80 bid and metolazone 2.5 mg added weekly.  Today he returns for HF follow up with his wife. Weight is up 4 lbs.  No dyspnea walking on flat ground.  Dyspnea with heavier exertion such as climbing stairs.  No chest pain.  No orthopnea/PND.   Cardiomems PADP goal 21: PADP 22 today  Labs (4/20): LDL 91 Labs (9/20): K 4, creatinine 1.95 => 2.1 Labs (10/20): K 4.3, creatinine 1.8 Labs (11/20): K 4.9, creatinine 1.73 Labs (2/21): K 4.1, creatinine 1.9 Labs (3/21): K 3.9, creatinine 1.75 Labs (9/23): K 4.0, creatinine 1.54 Labs (11/23): K 3.7, creatinine 1.87 => 1.95  PMH: 1. HTN 2. [name] stage 3 3. Type 2 diabetes 4. Hyperlipidemia 5. COPD: Suspected 6. PNA:  Recurrent, suspected aspiration.  7. PEA arrest 4/20: Respiratory arrest.  8. LBBB 9. TIA 10. Ascending aortic aneurysm: CT chest in 6/20 with 5.1 cm ascending aortic aneurysm. - CT chest w/o contrast (12/23): 4.9 cm ascending aorta.  11. Chronic primarily diastolic CHF:  - LHC ([id number]): Nonobstructive CAD.  - Echo (12/19): EF 45-50%.  - Echo (4/20): EF 45-50%, diffuse hypokinesis, normal RV.  - Cardiomems placement (11/20). - RHC (11/20): mean RA 2, PA 33/8, mean PCWP 11, CI 3.32 - Echo (6/21): EF 45-50%, grade I DD, RV normal, mild to moderate TR 12. Ventricular ectopy: 8/20 monitor showed PVCs and NSVT.  13. Peripheral arterial dopplers (10/20): No significant stenosis.   14. OSA: Bipap  Social History   Socioeconomic History   Marital status: Married    Spouse name: Not on file   Number of children: Not on file   Years of education: Not on file   Highest education level: Not on file  Occupational History   Not on file  Tobacco Use   Smoking status: Never   Smokeless tobacco: Never  Vaping Use   Vaping Use: Never used  Substance and Sexual Activity   Alcohol use: No   Drug use: No   Sexual activity: Not Currently    Partners: Female    Birth control/protection: None  Other Topics Concern   Not on file  Social History Narrative   Patient lives in private residence with supportive spouse   Social Determinants of Health   Financial Resource Strain: [place]  (  10/15/2017)   Overall Financial Resource Strain (CARDIA)    Difficulty of Paying Living Expenses: Not very hard  Food Insecurity: No Food Insecurity (10/15/2017)   Hunger Vital Sign    Worried About Running Out of Food in the Last Year: Never true    Ran Out of Food in the Last Year: Never true  Transportation Needs: Unknown (10/15/2017)   PRAPARE - Transportation    Lack of Transportation (Medical): Patient refused    Lack of Transportation (Non-Medical): Patient refused  Physical Activity: Inactive  (10/15/2017)   Exercise Vital Sign    Days of Exercise per Week: 0 days    Minutes of Exercise per Session: 0 min  Stress: No Stress Concern Present (10/15/2017)   [place] of Stress : Not at all  Social Connections: Unknown (10/15/2017)   Social Connection and Isolation Panel [NHANES]    Frequency of Communication with Friends and Family: Patient refused    Frequency of Social Gatherings with Friends and Family: Patient refused    Attends Religious Services: Patient refused    Active Member of Clubs or Organizations: Patient refused    Attends Archivist Meetings: Patient refused    Marital Status: Patient refused  Intimate Partner Violence: Not At Risk (10/15/2017)   Humiliation, Afraid, Rape, and Kick questionnaire    Fear of Current or Ex-Partner: No    Emotionally Abused: No    Physically Abused: No    Sexually Abused: No   Family History  Problem Relation Age of Onset   Emphysema Mother    Aneurysm Mother    Emphysema Father    Coronary artery disease Brother    Coronary artery disease Brother    Coronary artery disease Sister    ROS: All systems reviewed and negative except as per HPI.   Current Outpatient Medications  Medication Sig Dispense Refill   ASPIRIN 81 PO Take by mouth daily.     atorvastatin (LIPITOR) 40 MG tablet Take 1 tablet (40 mg total) by mouth every evening. 30 tablet 11   carvedilol (COREG) 12.5 MG tablet TAKE ONE TABLET BY MOUTH TWICE A DAY 180 tablet 3   empagliflozin (JARDIANCE) 10 MG TABS tablet Take 1 tablet (10 mg total) by mouth daily before breakfast. 90 tablet 2   finasteride (PROSCAR) 5 MG tablet Take 5 mg by mouth daily.     gabapentin (NEURONTIN) 400 MG capsule Take 400 mg by mouth 2 (two) times daily.     metFORMIN (GLUCOPHAGE) 1000 MG tablet Take 0.5 tablets (500 mg total) by mouth 2 (two) times daily with a meal. 30 tablet 3   metolazone (ZAROXOLYN)  2.5 MG tablet TAKE 1 TABLET BY MOUTH ONCE A WEEK ON THURSDAYS 5 tablet 3   montelukast (SINGULAIR) 10 MG tablet Take 10 mg by mouth daily.      Multiple Vitamin (MULTIVITAMIN) tablet Take 1 tablet by mouth daily.     pantoprazole (PROTONIX) 40 MG tablet Take 1 tablet (40 mg total) by mouth daily. 30 tablet 1   potassium chloride SA (KLOR-CON M) 20 MEQ tablet Take 2 tablets (40 mEq total) by mouth daily. 60 tablet 6   spironolactone (ALDACTONE) 25 MG tablet Take 0.5 tablets (12.5 mg total) by mouth daily. 45 tablet 3   SYMBICORT 80-4.5 MCG/ACT inhaler Inhale 2 puffs into the lungs daily as needed (wheezing).     tamsulosin (FLOMAX) 0.4 MG CAPS capsule Take 0.4 mg  by mouth daily.     torsemide (DEMADEX) 20 MG tablet Take 80 mg by mouth 2 (two) times daily.     VENTOLIN HFA 108 (90 Base) MCG/ACT inhaler Inhale 2 puffs into the lungs every 6 (six) hours as needed for wheezing or shortness of breath.      No current facility-administered medications for this encounter.   BP 110/64   Pulse 71   Wt 88.8 kg (195 lb 12.8 oz)   SpO2 96%   BMI 30.67 kg/m    Wt Readings from Last 3 Encounters:  08/30/22 88.8 kg (195 lb 12.8 oz)  05/30/22 87.4 kg (192 lb 9.6 oz)  04/18/22 90.2 kg (198 lb 12.8 oz)   General: NAD Neck: No JVD, no thyromegaly or thyroid nodule.  Lungs: Clear to auscultation bilaterally with normal respiratory effort. CV: Nondisplaced PMI.  Heart regular S1/S2, no S3/S4, no murmur.  No peripheral edema.  No carotid bruit.  Normal pedal pulses.  Abdomen: Soft, nontender, no hepatosplenomegaly, no distention.  Skin: Intact without lesions or rashes.  Neurologic: Alert and oriented x 3.  Psych: Normal affect. Extremities: No clubbing or cyanosis.  HEENT: Normal.   Assessment/Plan: 1. Chronic primarily diastolic CHF: Suspect that there is also significant RV failure.  Echo 4/20 with EF 45-50%.  Cath in 9/12 showed nonobstructive coronary disease.  S/p Cardiomems implant 11/20. Echo  (6/21): EF 45-50%, grade I DD, RV normal, mild to moderate TR. NYHA II-III.  He is not significantly volume overloaded on exam and Cardiomems is near goal.  - Continue Jardiance 10 mg daily.  - Continue torsemide 80 mg bid + metolazone 2.5 mg once a week. BMET/BNP today.  - Continue Coreg 12.5 mg bid.  - Add spironolactone 12.5 daily.  BMET 10 days.  - Continue to follow Cardiomems. - I will arrange for repeat echo.  2. [name]: Stage 3. BMET today.  3. OSA: On Bipap. Continue nightly.  4. Leg/foot ulcers: Peripheral arterial dopplers did not show significant PAD, suspect venous insufficiency. Ulcers are healed. - Follows with podiatry. 5 LBBB: Chronic.  6. Ascending aortic aneurysm: CT chest 6/20 with 5.1 cm ascending aortic aneurysm.  CT chest w/o contrast 12/23 with 4.9 cm ascending aorta.  - Repeat CT chest w/o contrast at 6 months in 6/24. 7. Hyperlipidemia: Goal LDL < 70 with diabetes.  - Check lipids today.   Follow up in 4 months with APP  Loralie Champagne 08/30/2022

## 2022-09-10 ENCOUNTER — Ambulatory Visit (HOSPITAL_COMMUNITY)
Admission: RE | Admit: 2022-09-10 | Discharge: 2022-09-10 | Disposition: A | Discharge status: Home / Self Care | Payer: Medicare HMO | Source: Ambulatory Visit | Attending: Cardiology | Admitting: Cardiology

## 2022-09-10 DIAGNOSIS — I5022 Chronic systolic (congestive) heart failure: Secondary | ICD-10-CM | POA: Insufficient documentation

## 2022-09-10 LAB — BASIC METABOLIC PANEL
Anion gap: 15 (ref 5–15)
BUN: 36 mg/dL — ABNORMAL HIGH (ref 8–23)
CO2: 26 mmol/L (ref 22–32)
Calcium: 8.8 mg/dL — ABNORMAL LOW (ref 8.9–10.3)
Chloride: 99 mmol/L (ref 98–111)
Creatinine, Ser: 1.85 mg/dL — ABNORMAL HIGH (ref 0.61–1.24)
GFR, Estimated: 38 mL/min — ABNORMAL LOW (ref >60)
Glucose, Bld: 154 mg/dL — ABNORMAL HIGH (ref 70–99)
Potassium: 4.1 mmol/L (ref 3.5–5.1)
Sodium: 140 mmol/L (ref 135–145)

## 2022-09-21 ENCOUNTER — Ambulatory Visit (HOSPITAL_COMMUNITY)
Admission: RE | Admit: 2022-09-21 | Discharge: 2022-09-21 | Disposition: A | Discharge status: Home / Self Care | Payer: Medicare HMO | Source: Ambulatory Visit | Attending: Cardiology | Admitting: Cardiology

## 2022-09-21 DIAGNOSIS — Q231 Congenital insufficiency of aortic valve: Secondary | ICD-10-CM | POA: Diagnosis not present

## 2022-09-21 DIAGNOSIS — E119 Type 2 diabetes mellitus without complications: Secondary | ICD-10-CM | POA: Diagnosis not present

## 2022-09-21 DIAGNOSIS — G4730 Sleep apnea, unspecified: Secondary | ICD-10-CM | POA: Insufficient documentation

## 2022-09-21 DIAGNOSIS — I5022 Chronic systolic (congestive) heart failure: Secondary | ICD-10-CM | POA: Insufficient documentation

## 2022-09-21 DIAGNOSIS — I110 Hypertensive heart disease with heart failure: Secondary | ICD-10-CM | POA: Diagnosis not present

## 2022-09-21 DIAGNOSIS — E785 Hyperlipidemia, unspecified: Secondary | ICD-10-CM | POA: Diagnosis not present

## 2022-09-21 LAB — ECHOCARDIOGRAM COMPLETE
AR max vel: 1.99 cm2
AV Peak grad: 11.6 mmHg
Ao pk vel: 1.7 m/s
Area-P 1/2: 2.29 cm2
Calc EF: 37 %
P 1/2 time: 613 msec
S' Lateral: 4.5 cm
Single Plane A2C EF: 40.4 %
Single Plane A4C EF: 33.4 %

## 2022-10-12 ENCOUNTER — Other Ambulatory Visit (HOSPITAL_COMMUNITY): Payer: Self-pay

## 2022-10-12 DIAGNOSIS — I714 Abdominal aortic aneurysm, without rupture, unspecified: Secondary | ICD-10-CM

## 2022-10-15 NOTE — Progress Notes (Signed)
Advanced Heart Failure Clinic Note  PCP: Loura Back, NP HF Cardiology: Dr. Shirlee Latch  Reason for Visit: Follow up for Diastolic Heart Failure   [name], [name], [name] for followup of CHF.  He has a history of mild LV systolic dysfunction (EF 45-50% range) with probably significant RV failure.  Patient was admitted in 4/20 with hypoxic respiratory failure due to PNA and decompensated HF.  Hospitalization was complicated by PEA arrest and AKI.   In 6/20, he was again admitted with hypoxemic respiratory failure with hypertensive crisis, CHF, and PNA.    Due to frequent hospital admission for CHF, underwent Cardiomems implantation 11/20.  RHC at that time showed optimized filling pressures after adjustment of torsemide. Outpatient sleep study found to have severe OSA and is now using Bipap. He was also referred to HF paramedicine program.   Echo 6/21 showed EF 45-50%, grade I DD, RV normal, mild to moderate TR.   Last seen 01/2020. Discharged from paramedicine 9/21 after demonstrating competency managing his medications. Unfortunately lost to follow up but has been using Cardiomems regularly.  Follow up 10/23, we had not seen him since 01/2020. NYHA II-early III symptoms and volume overloaded. Torsemide increased to 80 bid and metolazone 2.5 mg added weekly.  Echo 3/24 showed EF 35-40% with paradoxical septal motion, consistent with LBBB, grade I DD, normal RV, mild to moderate AI, AoRoot dilation 41 mm, AAA 45 mm  Today he returns with his wife to discuss recent echo results. Overall feeling fine. He has dyspnea in the mornings, does OK walking on flat ground without dyspnea. Has foot swelling and occasional dizziness, no falls. Denies palpitations, abnormal bleeding, CP, or PND/Orthopnea. Appetite ok. No fever or chills. He does not weigh at home. Taking all medications. Followed by podiatry. Wears BiPap.  ReDs 46%  ECG (personally reviewed): NSR 1AVB,  LBBB QRS 166 msec  Cardiomems PADP goal 21: unable to access Cardiomems readings today.  Labs (4/20): LDL 91 Labs (9/20): K 4, creatinine 1.95 => 2.1 Labs (10/20): K 4.3, creatinine 1.8 Labs (11/20): K 4.9, creatinine 1.73 Labs (2/21): K 4.1, creatinine 1.9 Labs (3/21): K 3.9, creatinine 1.75 Labs (9/23): K 4.0, creatinine 1.54 Labs (11/23): K 3.7, creatinine 1.87 => 1.95 Labs (2/24): LDL 65 Labs (3/24): K 4.1, creatinine 1.85  PMH: 1. HTN 2. [name] stage 3 3. Type 2 diabetes 4. Hyperlipidemia 5. COPD: Suspected 6. PNA: Recurrent, suspected aspiration.  7. PEA arrest 4/20: Respiratory arrest.  8. LBBB 9. TIA 10. Ascending aortic aneurysm: CT chest in 6/20 with 5.1 cm ascending aortic aneurysm. - CT chest w/o contrast (12/23): 4.9 cm ascending aorta.  11. 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  - Echo (12/19): EF 45-50%.  - Echo (4/20): EF 45-50%, diffuse hypokinesis, normal RV.  - Cardiomems placement (11/20). - RHC (11/20): mean RA 2, PA 33/8, mean PCWP 11, CI 3.32 - Echo (6/21): EF 45-50%, grade I DD, RV normal, mild to moderate TR - Echo (3/24): EF 35-40%, evidence of LBBB 12. Ventricular ectopy: 8/20 monitor showed PVCs and NSVT.  13. Peripheral arterial dopplers (10/20): No significant stenosis.   14. OSA: Bipap  Social History   Socioeconomic History   Marital status: Married    Spouse name: Not on file   Number of children: Not on file   Years of education: Not on file   Highest education level: Not on file  Occupational History  Not on file  Tobacco Use   Smoking status: Never   Smokeless tobacco: Never  Vaping Use   Vaping Use: Never used  Substance and Sexual Activity   Alcohol use: No   Drug use: No   Sexual activity: Not Currently    Partners: Female    Birth control/protection: None  Other Topics Concern   Not on file  Social History Narrative   Patient lives in private residence with supportive spouse   Social  Determinants of Health   Financial Resource Strain: Low Risk  (10/15/2017)   Overall Financial Resource Strain (CARDIA)    Difficulty of Paying Living Expenses: Not very hard  Food Insecurity: No Food Insecurity (10/15/2017)   Hunger Vital Sign    Worried About Running Out of Food in the Last Year: Never true    Ran Out of Food in the Last Year: Never true  Transportation Needs: Unknown (10/15/2017)   PRAPARE - Transportation    Lack of Transportation (Medical): Patient declined    Lack of Transportation (Non-Medical): Patient declined  Physical Activity: Inactive (10/15/2017)   Exercise Vital Sign    Days of Exercise per Week: 0 days    Minutes of Exercise per Session: 0 min  Stress: No Stress Concern Present (10/15/2017)   [employer] of Occupational Health - Occupational Stress Questionnaire    Feeling of Stress : Not at all  Social Connections: Unknown (10/15/2017)   Social Connection and Isolation Panel [NHANES]    Frequency of Communication with Friends and Family: Patient declined    Frequency of Social Gatherings with Friends and Family: Patient declined    Attends Religious Services: Patient declined    Database administrator or Organizations: Patient declined    Attends Banker Meetings: Patient declined    Marital Status: Patient declined  Intimate Partner Violence: Not At Risk (10/15/2017)   Humiliation, Afraid, Rape, and Kick questionnaire    Fear of Current or Ex-Partner: No    Emotionally Abused: No    Physically Abused: No    Sexually Abused: No   Family History  Problem Relation Age of Onset   Emphysema Mother    Aneurysm Mother    Emphysema Father    Coronary artery disease Brother    Coronary artery disease Brother    Coronary artery disease Sister    ROS: All systems reviewed and negative except as per HPI.   Current Outpatient Medications  Medication Sig Dispense Refill   ASPIRIN 81 PO Take by mouth daily.     atorvastatin (LIPITOR) 40  MG tablet Take 1 tablet (40 mg total) by mouth every evening. 30 tablet 11   carvedilol (COREG) 12.5 MG tablet TAKE ONE TABLET BY MOUTH TWICE A DAY 180 tablet 3   empagliflozin (JARDIANCE) 10 MG TABS tablet Take 1 tablet (10 mg total) by mouth daily before breakfast. 90 tablet 2   finasteride (PROSCAR) 5 MG tablet Take 5 mg by mouth daily.     gabapentin (NEURONTIN) 300 MG capsule Take 300 mg by mouth 2 (two) times daily.     metFORMIN (GLUCOPHAGE) 1000 MG tablet Take 0.5 tablets (500 mg total) by mouth 2 (two) times daily with a meal. 30 tablet 3   metolazone (ZAROXOLYN) 2.5 MG tablet TAKE 1 TABLET BY MOUTH ONCE A WEEK ON THURSDAYS 5 tablet 3   montelukast (SINGULAIR) 10 MG tablet Take 10 mg by mouth daily.      Multiple Vitamin (MULTIVITAMIN) tablet Take 1 tablet  by mouth daily.     pantoprazole (PROTONIX) 40 MG tablet Take 1 tablet (40 mg total) by mouth daily. 30 tablet 1   potassium chloride SA (KLOR-CON M) 20 MEQ tablet Take 2 tablets (40 mEq total) by mouth daily. 60 tablet 6   spironolactone (ALDACTONE) 25 MG tablet Take 0.5 tablets (12.5 mg total) by mouth daily. 45 tablet 3   SYMBICORT 80-4.5 MCG/ACT inhaler Inhale 2 puffs into the lungs daily as needed (wheezing).     tamsulosin (FLOMAX) 0.4 MG CAPS capsule Take 0.4 mg by mouth daily.     torsemide (DEMADEX) 20 MG tablet Take 80 mg by mouth 2 (two) times daily.     VENTOLIN HFA 108 (90 Base) MCG/ACT inhaler Inhale 2 puffs into the lungs every 6 (six) hours as needed for wheezing or shortness of breath.      No current facility-administered medications for this encounter.   BP 102/64   Pulse 68   Wt 89.1 kg (196 lb 6.4 oz)   SpO2 94%   BMI 30.76 kg/m    Wt Readings from Last 3 Encounters:  10/16/22 89.1 kg (196 lb 6.4 oz)  08/30/22 88.8 kg (195 lb 12.8 oz)  05/30/22 87.4 kg (192 lb 9.6 oz)   Physical Exam General:  NAD. No resp difficulty, walked into clinic HEENT: Normal Neck: Supple. JVP 8-9. Carotids 2+ bilat; no  bruits. No lymphadenopathy or thryomegaly appreciated. Cor: PMI nondisplaced. Regular rate & rhythm. No rubs, gallops or murmurs. Lungs: Clear Abdomen: Soft, nontender, nondistended. No hepatosplenomegaly. No bruits or masses. Good bowel sounds. Extremities: No cyanosis, clubbing, rash, 1+ BLE edema; excoriations on BLE no obvious wounds Neuro: Alert & oriented x 3, cranial nerves grossly intact. Moves all 4 extremities w/o difficulty. Affect pleasant.  Assessment/Plan: 1. Chronic primarily diastolic CHF: Suspect that there is also significant RV failure.  Echo 4/20 with EF 45-50%.  Cath in 9/12 showed nonobstructive coronary disease.  S/p Cardiomems implant 11/20. Echo (6/21): EF 45-50%, grade I DD, RV normal, mild to moderate TR. Echo (3/24) showed EF 35-40% with paradoxical septal motion. Mildly worse symptoms recently NYHA II-early III. He is at least mildly volume overloaded today, REDs 46%.  - Take extra metolazone 2.5 mg + 40 KCL with torsemide dose either this evening or tomorrow morning. BMET/BNP - Continue torsemide 80 mg bid + metolazone 2.5 mg once a week. BMET/BNP today.  - Continue Coreg 12.5 mg bid.   Continue Jardiance 10 mg daily.  - Continue spironolactone 12.5 daily. - Continue to follow Cardiomems.  - With EF further reduced and now with evidence of LBBB, arrange for cMRI to further quantify. If EF < 35%, will proceed with referral to EP to discuss CRT candidacy. We discussed this today. 2. [name]: Stage 3. BMET today.  3. OSA: On Bipap. Continue nightly.  4. Leg/foot ulcers: Peripheral arterial dopplers did not show significant PAD, suspect venous insufficiency. Wife says he "probably" has wounds on his legs. On exam today, scratch marks on lower legs, but no open areas or obvious wounds.  - Follows with podiatry. 5. LBBB: Chronic. QRS 166 msec on ECG today. Echo with paradoxical septal motion, with reduced EF 35-40% range. As above, arrange cMRI to quantify EF and candidacy for  CRT. Discussed with Dr. Shirlee Latch 6. Ascending aortic aneurysm: CT chest 6/20 with 5.1 cm ascending aortic aneurysm.  CT chest w/o contrast 12/23 with 4.9 cm ascending aorta.  - Repeat CT chest w/o contrast at 6 months in  6/24. 7. Hyperlipidemia: Goal LDL < 70 with diabetes.  - Good lipids 2/24   Follow up in 3 months with Dr. Shirlee Latch.  [facility] FNP-BC 10/16/2022

## 2022-10-16 ENCOUNTER — Telehealth (HOSPITAL_COMMUNITY): Payer: Self-pay

## 2022-10-16 ENCOUNTER — Ambulatory Visit (HOSPITAL_COMMUNITY)
Admission: RE | Admit: 2022-10-16 | Discharge: 2022-10-16 | Disposition: A | Discharge status: Home / Self Care | Payer: Medicare HMO | Source: Ambulatory Visit | Attending: Family Medicine | Admitting: Family Medicine

## 2022-10-16 ENCOUNTER — Encounter (HOSPITAL_COMMUNITY): Payer: Self-pay

## 2022-10-16 VITALS — BP 102/64 | HR 68 | Wt 196.4 lb

## 2022-10-16 DIAGNOSIS — E1122 Type 2 diabetes mellitus with diabetic chronic kidney disease: Secondary | ICD-10-CM | POA: Diagnosis not present

## 2022-10-16 DIAGNOSIS — Z79899 Other long term (current) drug therapy: Secondary | ICD-10-CM | POA: Insufficient documentation

## 2022-10-16 DIAGNOSIS — N183 Chronic kidney disease, stage 3 unspecified: Secondary | ICD-10-CM | POA: Diagnosis not present

## 2022-10-16 DIAGNOSIS — I130 Hypertensive heart and chronic kidney disease with heart failure and stage 1 through stage 4 chronic kidney disease, or unspecified chronic kidney disease: Secondary | ICD-10-CM | POA: Diagnosis not present

## 2022-10-16 DIAGNOSIS — I714 Abdominal aortic aneurysm, without rupture, unspecified: Secondary | ICD-10-CM

## 2022-10-16 DIAGNOSIS — E785 Hyperlipidemia, unspecified: Secondary | ICD-10-CM | POA: Diagnosis not present

## 2022-10-16 DIAGNOSIS — I872 Venous insufficiency (chronic) (peripheral): Secondary | ICD-10-CM | POA: Diagnosis not present

## 2022-10-16 DIAGNOSIS — Z7984 Long term (current) use of oral hypoglycemic drugs: Secondary | ICD-10-CM | POA: Insufficient documentation

## 2022-10-16 DIAGNOSIS — I5022 Chronic systolic (congestive) heart failure: Secondary | ICD-10-CM

## 2022-10-16 DIAGNOSIS — I5032 Chronic diastolic (congestive) heart failure: Secondary | ICD-10-CM | POA: Insufficient documentation

## 2022-10-16 DIAGNOSIS — G4733 Obstructive sleep apnea (adult) (pediatric): Secondary | ICD-10-CM | POA: Insufficient documentation

## 2022-10-16 DIAGNOSIS — I447 Left bundle-branch block, unspecified: Secondary | ICD-10-CM | POA: Insufficient documentation

## 2022-10-16 DIAGNOSIS — I7121 Aneurysm of the ascending aorta, without rupture: Secondary | ICD-10-CM | POA: Diagnosis not present

## 2022-10-16 DIAGNOSIS — R600 Localized edema: Secondary | ICD-10-CM | POA: Insufficient documentation

## 2022-10-16 LAB — CBC
HCT: 35.8 % — ABNORMAL LOW (ref 39.0–52.0)
Hemoglobin: 11.8 g/dL — ABNORMAL LOW (ref 13.0–17.0)
MCH: 29.4 pg (ref 26.0–34.0)
MCHC: 33 g/dL (ref 30.0–36.0)
MCV: 89.1 fL (ref 80.0–100.0)
Platelets: 287 10*3/uL (ref 150–400)
RBC: 4.02 MIL/uL — ABNORMAL LOW (ref 4.22–5.81)
RDW: 15.1 % (ref 11.5–15.5)
WBC: 8.3 10*3/uL (ref 4.0–10.5)
nRBC: 0 % (ref 0.0–0.2)

## 2022-10-16 LAB — BASIC METABOLIC PANEL
Anion gap: 16 — ABNORMAL HIGH (ref 5–15)
BUN: 57 mg/dL — ABNORMAL HIGH (ref 8–23)
CO2: 31 mmol/L (ref 22–32)
Calcium: 9.1 mg/dL (ref 8.9–10.3)
Chloride: 88 mmol/L — ABNORMAL LOW (ref 98–111)
Creatinine, Ser: 2.37 mg/dL — ABNORMAL HIGH (ref 0.61–1.24)
GFR, Estimated: 28 mL/min — ABNORMAL LOW (ref >60)
Glucose, Bld: 137 mg/dL — ABNORMAL HIGH (ref 70–99)
Potassium: 3.9 mmol/L (ref 3.5–5.1)
Sodium: 135 mmol/L (ref 135–145)

## 2022-10-16 LAB — BRAIN NATRIURETIC PEPTIDE: B Natriuretic Peptide: 99.1 pg/mL (ref 0.0–100.0)

## 2022-10-16 NOTE — Telephone Encounter (Signed)
-----   Message from Jacklynn Ganong, [place] sent at 10/16/2022  3:59 PM EDT ----- Do NOT take extra dose of metolazone this week, as discussed at visit. Labs show dehydration.  Hold torsemide, spiro Jardiance x 3 days, then resume at home doses of torsemide 80 bid, spiro 12.5 and jardiance 10 daily.  Increase oral fluids, repeat BMET in 1 week

## 2022-10-16 NOTE — Progress Notes (Signed)
ReDS Vest / Clip - 10/16/22 1200       ReDS Vest / Clip   Station Marker C    Ruler Value 31    ReDS Value Range High volume overload    ReDS Actual Value 46

## 2022-10-16 NOTE — Patient Instructions (Signed)
TAKE Metolazone 2.5 mg with 40 meq of potassium on 10/17/2022 -this will be in addition to your torsemide dose  Labs today We will only contact you if something comes back abnormal or we need to make some changes. Otherwise no news is good news!  Your physician has requested that you have a cardiac MRI. Cardiac MRI uses a computer to create images of your heart as its beating, producing both still and moving pictures of your heart and major blood vessels. For further information please visit [email or web address]. Please follow the instruction sheet given to you today for more information.  Your physician wants you to follow-up in: 3 months with Dr Shirlee Latch. You will receive a reminder letter in the mail two months in advance. If you don't receive a letter, please call our office to schedule the follow-up appointment.   Do the following things EVERYDAY: Weigh yourself in the morning before breakfast. Write it down and keep it in a log. Take your medicines as prescribed Eat low salt foods--Limit salt (sodium) to 2000 mg per day.  Stay as active as you can everyday Limit all fluids for the day to less than 2 liters  At the [facility], you and your health needs are our priority. As part of our continuing mission to provide you with exceptional heart care, we have created designated Provider Care Teams. These Care Teams include your primary Cardiologist (physician) and Advanced Practice Providers (APPs- Physician Assistants and Nurse Practitioners) who all work together to provide you with the care you need, when you need it.   You may see any of the following providers on your designated Care Team at your next follow up: Dr Arvilla Meres Dr Marca Ancona Dr. Marcos Eke, NP Robbie Lis, [place] [facility] [place], [place] Brynda Peon, NP Karle Plumber, PharmD   Please be sure to bring in all your medications bottles to every appointment.    Thank  you for choosing [facility]   If you have any questions or concerns before your next appointment please send [place] a message through [place] or call our office at [phone].    TO LEAVE A MESSAGE FOR THE NURSE SELECT OPTION 2, PLEASE LEAVE A MESSAGE INCLUDING: YOUR NAME DATE OF BIRTH CALL BACK NUMBER REASON FOR CALL**this is important as we prioritize the call backs  YOU WILL RECEIVE A CALL BACK THE SAME DAY AS LONG AS YOU CALL BEFORE 4:00 PM.

## 2022-10-16 NOTE — Telephone Encounter (Signed)
Lab order placed and appointment scheduled. Pt aware, agreeable, and verbalized understanding.

## 2022-10-23 ENCOUNTER — Ambulatory Visit (HOSPITAL_COMMUNITY)
Admission: RE | Admit: 2022-10-23 | Discharge: 2022-10-23 | Disposition: A | Discharge status: Home / Self Care | Payer: Medicare HMO | Source: Ambulatory Visit | Attending: Cardiology | Admitting: Cardiology

## 2022-10-23 DIAGNOSIS — I5022 Chronic systolic (congestive) heart failure: Secondary | ICD-10-CM | POA: Insufficient documentation

## 2022-10-23 LAB — BASIC METABOLIC PANEL
Anion gap: 14 (ref 5–15)
BUN: 58 mg/dL — ABNORMAL HIGH (ref 8–23)
CO2: 30 mmol/L (ref 22–32)
Calcium: 8.6 mg/dL — ABNORMAL LOW (ref 8.9–10.3)
Chloride: 94 mmol/L — ABNORMAL LOW (ref 98–111)
Creatinine, Ser: 2.24 mg/dL — ABNORMAL HIGH (ref 0.61–1.24)
GFR, Estimated: 30 mL/min — ABNORMAL LOW (ref >60)
Glucose, Bld: 153 mg/dL — ABNORMAL HIGH (ref 70–99)
Potassium: 3.5 mmol/L (ref 3.5–5.1)
Sodium: 138 mmol/L (ref 135–145)

## 2022-10-30 ENCOUNTER — Other Ambulatory Visit (HOSPITAL_COMMUNITY): Payer: Self-pay

## 2022-10-30 DIAGNOSIS — I5022 Chronic systolic (congestive) heart failure: Secondary | ICD-10-CM

## 2022-11-02 ENCOUNTER — Telehealth (HOSPITAL_COMMUNITY): Payer: Self-pay | Admitting: Adult Health

## 2022-11-02 NOTE — Telephone Encounter (Signed)
Left message to call us back

## 2022-11-02 NOTE — Telephone Encounter (Signed)
   Cardiomems Remote Monitoring  S/P Cardiomems Implant   PAD Goal: 21 Most recent reading: 36  Recommended changes: Elevated reading. Increase torsemide to 100 mg twice a day x 3 days then back to torsemide 80 mg twice a day. Please call.   I continue to review and analyze the patients PA pressures weekly (and more often as needed) to bring PA pressures within the optimal range.      Zareya Tuckett NP-C  3:23 PM

## 2022-11-05 NOTE — Telephone Encounter (Signed)
Spoke with wife and went over medication changes for him. She verbalized understanding.

## 2022-11-07 ENCOUNTER — Ambulatory Visit (HOSPITAL_COMMUNITY)
Admission: RE | Admit: 2022-11-07 | Discharge: 2022-11-07 | Disposition: A | Discharge status: Home / Self Care | Payer: Medicare HMO | Source: Ambulatory Visit | Attending: Internal Medicine | Admitting: Internal Medicine

## 2022-11-07 DIAGNOSIS — I5022 Chronic systolic (congestive) heart failure: Secondary | ICD-10-CM | POA: Diagnosis not present

## 2022-11-07 LAB — BASIC METABOLIC PANEL
Anion gap: 13 (ref 5–15)
BUN: 40 mg/dL — ABNORMAL HIGH (ref 8–23)
CO2: 29 mmol/L (ref 22–32)
Calcium: 8.2 mg/dL — ABNORMAL LOW (ref 8.9–10.3)
Chloride: 95 mmol/L — ABNORMAL LOW (ref 98–111)
Creatinine, Ser: 1.99 mg/dL — ABNORMAL HIGH (ref 0.61–1.24)
GFR, Estimated: 35 mL/min — ABNORMAL LOW (ref >60)
Glucose, Bld: 199 mg/dL — ABNORMAL HIGH (ref 70–99)
Potassium: 4.5 mmol/L (ref 3.5–5.1)
Sodium: 137 mmol/L (ref 135–145)

## 2022-11-11 ENCOUNTER — Other Ambulatory Visit (HOSPITAL_COMMUNITY): Payer: Self-pay | Admitting: Family Medicine

## 2022-11-21 ENCOUNTER — Encounter: Payer: Self-pay | Admitting: Podiatry

## 2022-11-21 ENCOUNTER — Ambulatory Visit: Payer: Medicare HMO | Admitting: Podiatry

## 2022-11-21 DIAGNOSIS — B351 Tinea unguium: Secondary | ICD-10-CM | POA: Diagnosis not present

## 2022-11-21 DIAGNOSIS — M79675 Pain in left toe(s): Secondary | ICD-10-CM

## 2022-11-21 DIAGNOSIS — E119 Type 2 diabetes mellitus without complications: Secondary | ICD-10-CM

## 2022-11-21 DIAGNOSIS — M79674 Pain in right toe(s): Secondary | ICD-10-CM | POA: Diagnosis not present

## 2022-11-21 NOTE — Progress Notes (Signed)
This patient returns to my office for at risk foot care.  This patient requires this care by a professional since this patient will be at risk due to having diabetes type 2 and chronic venous insufficiency.  This patient is unable to cut nails himself since the patient cannot reach his nails.These nails are painful walking and wearing shoes.  This patient presents for at risk foot care today.  General Appearance  Alert, conversant and in no acute stress.  Vascular  Dorsalis pedis and posterior tibial  pulses are not  palpable  bilaterally.  Capillary return is within normal limits  bilaterally. Temperature is within normal limits  bilaterally.  Neurologic  Senn-Weinstein monofilament wire test diminished/absent  bilaterally. Muscle power within normal limits bilaterally.  Nails Thick disfigured discolored nails with subungual debris  from hallux to fifth toes bilaterally. No evidence of bacterial infection or drainage bilaterally.  Orthopedic  No limitations of motion  feet .  No crepitus or effusions noted.  No bony pathology or digital deformities noted.  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  Midfoot  DJD  B/L.  Limited rearfoot  ROM  B/L.  Skin  normotropic skin with no porokeratosis noted bilaterally.  No signs of infections or ulcers noted.     Onychomycosis  Pain in right toes  Pain in left toes  Consent was obtained for treatment procedures.   Mechanical debridement of nails 1-5  bilaterally performed with a nail nipper.  Filed with dremel without incident.    Return office visit    3 months                  Told patient to return for periodic foot care and evaluation due to potential at risk complications.   Quindell Shere DPM  

## 2022-11-22 ENCOUNTER — Telehealth (HOSPITAL_COMMUNITY): Payer: Self-pay | Admitting: Adult Health

## 2022-11-22 NOTE — Telephone Encounter (Signed)
  Cardiomems Remote Monitoring  S/P Cardiomems Implant   PAD Goal: 21 Most recent reading: 25 suggestive of fluid accumulation   Recommended changes: Take extra 20 mg torsemide x2 days.   I continue to review and analyze the patients PA pressures weekly (and more often as needed) to bring PA pressures within the optimal range.    Harold Pursifull NP-C  11:14 AM

## 2022-11-22 NOTE — Telephone Encounter (Signed)
Pt aware via wife 

## 2022-11-23 ENCOUNTER — Telehealth: Payer: Self-pay | Admitting: *Deleted

## 2022-11-27 ENCOUNTER — Ambulatory Visit (HOSPITAL_COMMUNITY): Payer: Medicare HMO

## 2022-12-21 ENCOUNTER — Ambulatory Visit (HOSPITAL_COMMUNITY): Payer: Medicare HMO | Attending: Cardiology

## 2023-01-08 ENCOUNTER — Telehealth (HOSPITAL_COMMUNITY): Payer: Self-pay | Admitting: Emergency Medicine

## 2023-01-08 NOTE — Telephone Encounter (Signed)
Attempted to call patient regarding upcoming cardiac CT appointment. °Left message on voicemail with name and callback number °Parrish Daddario RN Navigator Cardiac Imaging °[facility] °[phone] Office °[phone] Cell ° °

## 2023-01-09 ENCOUNTER — Ambulatory Visit (HOSPITAL_COMMUNITY)
Admission: RE | Admit: 2023-01-09 | Discharge: 2023-01-09 | Disposition: A | Discharge status: Home / Self Care | Payer: Medicare HMO | Source: Ambulatory Visit | Attending: Family Medicine | Admitting: Family Medicine

## 2023-01-09 ENCOUNTER — Other Ambulatory Visit (HOSPITAL_COMMUNITY): Payer: Self-pay | Admitting: Family Medicine

## 2023-01-09 DIAGNOSIS — I5022 Chronic systolic (congestive) heart failure: Secondary | ICD-10-CM

## 2023-01-09 MED ORDER — GADOBUTROL 1 MMOL/ML IV SOLN
9.0000 mL | Freq: Once | INTRAVENOUS | Status: AC | PRN
  Administered 2023-01-09: 9 mL via INTRAVENOUS

## 2023-01-16 ENCOUNTER — Telehealth (HOSPITAL_COMMUNITY): Payer: Self-pay | Admitting: Vascular Surgery

## 2023-01-16 ENCOUNTER — Telehealth (HOSPITAL_COMMUNITY): Payer: Self-pay | Admitting: *Deleted

## 2023-01-16 NOTE — Telephone Encounter (Signed)
Lvm givng appt w/ [name] , asked pt to call back to confirm

## 2023-01-16 NOTE — Telephone Encounter (Signed)
Called patient per Harold Rome, NP with echo results and instructions. Left message asking patient to call us for echo results if he wishes to discuss. Also left message informing him of his f/u appointment scheduled with Dr. Shirlee Latch on 01/23/23 at 2:40 where he can discuss results in detail. Asked patient to call us at [phone] if any questions or concerns.

## 2023-01-18 ENCOUNTER — Telehealth (HOSPITAL_COMMUNITY): Payer: Self-pay | Admitting: Adult Health

## 2023-01-18 NOTE — Telephone Encounter (Signed)
hone: [phone] Judie Petit) LMOM

## 2023-01-18 NOTE — Telephone Encounter (Signed)
  Cardiomems Remote Monitoring  S/P Cardiomems Implant   PAD Goal: 21 Most recent reading: 27   Recommended changes: Reading suggestive of fluid accumulation. Recommend increase torsemide to 100 mg twice a day x 2 days then back to 80 mg twice a day. Please call.   I continue to review and analyze the patients PA pressures weekly (and more often as needed) to bring PA pressures within the optimal range.    Dottie Vaquerano NP-C  8:59 AM

## 2023-01-21 NOTE — Telephone Encounter (Signed)
Pt aware and voiced understanding 

## 2023-01-23 ENCOUNTER — Inpatient Hospital Stay (HOSPITAL_COMMUNITY): Admission: RE | Admit: 2023-01-23 | Payer: Medicare HMO | Source: Ambulatory Visit | Admitting: Cardiology

## 2023-02-22 ENCOUNTER — Other Ambulatory Visit (HOSPITAL_COMMUNITY): Payer: Self-pay | Admitting: Family Medicine

## 2023-02-25 ENCOUNTER — Ambulatory Visit: Payer: Medicare HMO | Admitting: Podiatry

## 2023-02-25 ENCOUNTER — Encounter: Payer: Self-pay | Admitting: Podiatry

## 2023-02-25 DIAGNOSIS — M79675 Pain in left toe(s): Secondary | ICD-10-CM

## 2023-02-25 DIAGNOSIS — B351 Tinea unguium: Secondary | ICD-10-CM

## 2023-02-25 DIAGNOSIS — M79674 Pain in right toe(s): Secondary | ICD-10-CM

## 2023-02-25 DIAGNOSIS — E119 Type 2 diabetes mellitus without complications: Secondary | ICD-10-CM

## 2023-02-25 NOTE — Progress Notes (Signed)
This patient returns to my office for at risk foot care.  This patient requires this care by a professional since this patient will be at risk due to having diabetes type 2 and chronic venous insufficiency.  This patient is unable to cut nails himself since the patient cannot reach his nails.These nails are painful walking and wearing shoes.  This patient presents for at risk foot care today.  General Appearance  Alert, conversant and in no acute stress.  Vascular  Dorsalis pedis and posterior tibial  pulses are not  palpable  bilaterally.  Capillary return is within normal limits  bilaterally. Temperature is within normal limits  bilaterally.  Neurologic  Senn-Weinstein monofilament wire test diminished/absent  bilaterally. Muscle power within normal limits bilaterally.  Nails Thick disfigured discolored nails with subungual debris  from hallux to fifth toes bilaterally. No evidence of bacterial infection or drainage bilaterally.  Orthopedic  No limitations of motion  feet .  No crepitus or effusions noted.  No bony pathology or digital deformities noted.  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  Midfoot  DJD  B/L.  Limited rearfoot  ROM  B/L.  Skin  normotropic skin with no porokeratosis noted bilaterally.  No signs of infections or ulcers noted.     Onychomycosis  Pain in right toes  Pain in left toes  Consent was obtained for treatment procedures.   Mechanical debridement of nails 1-5  bilaterally performed with a nail nipper.  Filed with dremel without incident.    Return office visit    3 months                  Told patient to return for periodic foot care and evaluation due to potential at risk complications.   Gregory Mayer DPM  

## 2023-03-15 ENCOUNTER — Telehealth (HOSPITAL_COMMUNITY): Payer: Self-pay | Admitting: Adult Health

## 2023-03-15 NOTE — Telephone Encounter (Signed)
  Cardiomems Remote Monitoring  S/P Cardiomems Implant 05/07/2019  PAD Goal: 21 Most recent reading: 28 suggestive of fluid accumulation   Recommended changes: Take an extra 20 mg of torsemide for the next 2 days. Limit salt intake.   I continue to review and analyze the patients PA pressures weekly (and more often as needed) to bring PA pressures within the optimal range.     Kiley Solimine NP-C  10:23 AM

## 2023-03-18 NOTE — Telephone Encounter (Signed)
Spoke to patient's wife Kendal Hymen and she was made aware, agreeable, and verbalized understanding.

## 2023-04-05 ENCOUNTER — Telehealth (HOSPITAL_COMMUNITY): Payer: Self-pay | Admitting: Adult Health

## 2023-04-05 NOTE — Telephone Encounter (Signed)
  Cardiomems Remote Monitoring  S/P Cardiomems Implant   PAD Goal: 21 Most recent reading: 30 suggestive of fluid accumulation.   Recommended changes: Please call and increase torsemide to 100 mg twice a day x 2 day then back to 80 mg twice a day. Obtain BMEt next week. Please verify he took metolazone on Thursday. Reinforce low salt food choices.   I continue to review and analyze the patients PA pressures weekly (and more often as needed) to bring PA pressures within the optimal range.     Silveria Botz NP-C  10:09 AM

## 2023-04-18 ENCOUNTER — Telehealth (HOSPITAL_COMMUNITY): Payer: Self-pay | Admitting: Adult Health

## 2023-04-18 NOTE — Telephone Encounter (Signed)
Pt aware via wife

## 2023-04-18 NOTE — Telephone Encounter (Signed)
  Cardiomems Remote Monitoring  S/P Cardiomems Implant   PAD Goal: 21 Most recent reading: 26 suggestive of fluid accumulation   Recommended changes: Take an extra 20 mg torsemide x 3 days. Please call.   I continue to review and analyze the patients PA pressures weekly (and more often as needed) to bring PA pressures within the optimal range.     Takiyah Bohnsack NP-C   1:44 PM

## 2023-04-22 ENCOUNTER — Ambulatory Visit (HOSPITAL_COMMUNITY)
Admission: RE | Admit: 2023-04-22 | Discharge: 2023-04-22 | Disposition: A | Discharge status: Home / Self Care | Payer: Medicare HMO | Source: Ambulatory Visit | Attending: Cardiology | Admitting: Cardiology

## 2023-04-22 ENCOUNTER — Telehealth (HOSPITAL_COMMUNITY): Payer: Self-pay

## 2023-04-22 ENCOUNTER — Encounter (HOSPITAL_COMMUNITY): Payer: Self-pay | Admitting: Cardiology

## 2023-04-22 VITALS — BP 110/70 | HR 71 | Wt 203.0 lb

## 2023-04-22 DIAGNOSIS — E1122 Type 2 diabetes mellitus with diabetic chronic kidney disease: Secondary | ICD-10-CM | POA: Diagnosis not present

## 2023-04-22 DIAGNOSIS — Z7984 Long term (current) use of oral hypoglycemic drugs: Secondary | ICD-10-CM | POA: Insufficient documentation

## 2023-04-22 DIAGNOSIS — I130 Hypertensive heart and chronic kidney disease with heart failure and stage 1 through stage 4 chronic kidney disease, or unspecified chronic kidney disease: Secondary | ICD-10-CM | POA: Diagnosis not present

## 2023-04-22 DIAGNOSIS — G4733 Obstructive sleep apnea (adult) (pediatric): Secondary | ICD-10-CM | POA: Diagnosis not present

## 2023-04-22 DIAGNOSIS — E785 Hyperlipidemia, unspecified: Secondary | ICD-10-CM | POA: Insufficient documentation

## 2023-04-22 DIAGNOSIS — Z79899 Other long term (current) drug therapy: Secondary | ICD-10-CM | POA: Diagnosis not present

## 2023-04-22 DIAGNOSIS — I5030 Unspecified diastolic (congestive) heart failure: Secondary | ICD-10-CM | POA: Diagnosis present

## 2023-04-22 DIAGNOSIS — I5022 Chronic systolic (congestive) heart failure: Secondary | ICD-10-CM

## 2023-04-22 DIAGNOSIS — R9431 Abnormal electrocardiogram [ECG] [EKG]: Secondary | ICD-10-CM | POA: Diagnosis not present

## 2023-04-22 DIAGNOSIS — L97909 Non-pressure chronic ulcer of unspecified part of unspecified lower leg with unspecified severity: Secondary | ICD-10-CM | POA: Insufficient documentation

## 2023-04-22 DIAGNOSIS — I83009 Varicose veins of unspecified lower extremity with ulcer of unspecified site: Secondary | ICD-10-CM | POA: Diagnosis not present

## 2023-04-22 DIAGNOSIS — I428 Other cardiomyopathies: Secondary | ICD-10-CM | POA: Insufficient documentation

## 2023-04-22 DIAGNOSIS — N183 Chronic kidney disease, stage 3 unspecified: Secondary | ICD-10-CM | POA: Diagnosis not present

## 2023-04-22 DIAGNOSIS — I7121 Aneurysm of the ascending aorta, without rupture: Secondary | ICD-10-CM | POA: Insufficient documentation

## 2023-04-22 LAB — LIPID PANEL
Cholesterol: 165 mg/dL (ref 0–200)
HDL: 37 mg/dL — ABNORMAL LOW (ref >40)
LDL Cholesterol: 78 mg/dL (ref 0–99)
Total CHOL/HDL Ratio: 4.5 {ratio}
Triglycerides: 249 mg/dL — ABNORMAL HIGH (ref <150)
VLDL: 50 mg/dL — ABNORMAL HIGH (ref 0–40)

## 2023-04-22 LAB — BASIC METABOLIC PANEL
Anion gap: 13 (ref 5–15)
BUN: 52 mg/dL — ABNORMAL HIGH (ref 8–23)
CO2: 31 mmol/L (ref 22–32)
Calcium: 8.2 mg/dL — ABNORMAL LOW (ref 8.9–10.3)
Chloride: 94 mmol/L — ABNORMAL LOW (ref 98–111)
Creatinine, Ser: 2.51 mg/dL — ABNORMAL HIGH (ref 0.61–1.24)
GFR, Estimated: 26 mL/min — ABNORMAL LOW (ref >60)
Glucose, Bld: 164 mg/dL — ABNORMAL HIGH (ref 70–99)
Potassium: 4.1 mmol/L (ref 3.5–5.1)
Sodium: 138 mmol/L (ref 135–145)

## 2023-04-22 LAB — HEMOGLOBIN A1C
Hgb A1c MFr Bld: 8.5 % — ABNORMAL HIGH (ref 4.8–5.6)
Mean Plasma Glucose: 197.25 mg/dL

## 2023-04-22 LAB — BRAIN NATRIURETIC PEPTIDE: B Natriuretic Peptide: 121.8 pg/mL — ABNORMAL HIGH (ref 0.0–100.0)

## 2023-04-22 MED ORDER — SPIRONOLACTONE 25 MG PO TABS: 25.0000 mg | ORAL_TABLET | Freq: Every day | ORAL | 3 refills | Status: DC

## 2023-04-22 MED ORDER — SPIRONOLACTONE 25 MG PO TABS: 12.5000 mg | ORAL_TABLET | Freq: Every day | ORAL | 3 refills | Status: DC

## 2023-04-22 MED ORDER — VALSARTAN 40 MG PO TABS: 20.0000 mg | ORAL_TABLET | Freq: Every evening | ORAL | 3 refills | Status: DC

## 2023-04-22 NOTE — Progress Notes (Signed)
[facility] Note  PCP: Loura Back, NP HF Cardiology: Dr. Shirlee Latch  Reason for Visit: Follow up for Diastolic Heart Failure   [name], [name], [name] for followup of CHF.  He has a history of mild LV systolic dysfunction (EF 45-50% range) with probably significant RV failure.  Patient was admitted in 4/20 with hypoxic respiratory failure due to PNA and decompensated HF.  Hospitalization was complicated by PEA arrest and AKI.   In 6/20, he was again admitted with hypoxemic respiratory failure with hypertensive crisis, CHF, and PNA.    Due to frequent hospital admission for CHF, underwent Cardiomems implantation 11/20.  RHC at that time showed optimized filling pressures after adjustment of torsemide. Outpatient sleep study found to have severe OSA and is now using Bipap. He was also referred to HF paramedicine program.   Echo 6/21 showed EF 45-50%, grade I DD, RV normal, mild to moderate TR.   Last seen 01/2020. Discharged from paramedicine 9/21 after demonstrating competency managing his medications. Unfortunately lost to follow up but has been using Cardiomems regularly.  Follow up 10/23, we had not seen him since 01/2020. NYHA II-early III symptoms and volume overloaded. Torsemide increased to 80 bid and metolazone 2.5 mg added weekly.  Echo 3/24 showed EF 35-40% with paradoxical septal motion, consistent with LBBB, grade I DD, normal RV, mild to moderate AI, AoRoot dilation 41 mm, ascending aorta 45 mm.   Cardiac MRI in 7/24 showed mild LV dilation with EF 38%, septal-lateral dyssynchrony, RV EF 47%, nonspecific inferior RV insertion site LGE, moderate MR.   He returns today for followup of CHF.   Weight is up 7 lbs.  No dyspnea walking on flat ground.  He does have bendopnea.  No orthopnea/PND.  He can go to the mailbox and take out the trash without dyspnea.  He fatigues easily.  He wears Bipap at night.    ECG (personally  reviewed): NSR, LBBB 154 msec  Cardiomems PADP goal 21: Cardiomems reading 22 today.   Labs (4/20): LDL 91 Labs (9/20): K 4, creatinine 1.95 => 2.1 Labs (10/20): K 4.3, creatinine 1.8 Labs (11/20): K 4.9, creatinine 1.73 Labs (2/21): K 4.1, creatinine 1.9 Labs (3/21): K 3.9, creatinine 1.75 Labs (9/23): K 4.0, creatinine 1.54 Labs (11/23): K 3.7, creatinine 1.87 => 1.95 Labs (2/24): LDL 65 Labs (3/24): K 4.1, creatinine 1.85 Labs (5/24): K 4.5, creatinine 1.99  PMH: 1. HTN 2. [name] stage 3 3. Type 2 diabetes 4. Hyperlipidemia 5. COPD: Suspected 6. PNA: Recurrent, suspected aspiration.  7. PEA arrest 4/20: Respiratory arrest.  8. LBBB 9. TIA 10. Ascending aortic aneurysm: CT chest in 6/20 with 5.1 cm ascending aortic aneurysm. - CT chest w/o contrast (12/23): 4.9 cm ascending aorta.  11. 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  - Echo (12/19): EF 45-50%.  - Echo (4/20): EF 45-50%, diffuse hypokinesis, normal RV.  - Cardiomems placement (11/20). - RHC (11/20): mean RA 2, PA 33/8, mean PCWP 11, CI 3.32 - Echo (6/21): EF 45-50%, grade I DD, RV normal, mild to moderate TR - Echo (3/24): EF 35-40%, evidence of LBBB - Cardiac MRI in 7/24 showed mild LV dilation with EF 38%, septal-lateral dyssynchrony, RV EF 47%, nonspecific inferior RV insertion site LGE, moderate MR.  12. Ventricular ectopy: 8/20 monitor showed PVCs and NSVT.  13. Peripheral arterial dopplers (10/20): No significant stenosis.   14. OSA: Bipap  Social History  Socioeconomic History   Marital status: Married    Spouse name: Not on file   Number of children: Not on file   Years of education: Not on file   Highest education level: Not on file  Occupational History   Not on file  Tobacco Use   Smoking status: Never   Smokeless tobacco: Never  Vaping Use   Vaping status: Never Used  Substance and Sexual Activity   Alcohol use: No   Drug use: No   Sexual activity: Not  Currently    Partners: Female    Birth control/protection: None  Other Topics Concern   Not on file  Social History Narrative   Patient lives in private residence with supportive spouse   Social Determinants of Health   Financial Resource Strain: Low Risk  (10/15/2017)   Overall Financial Resource Strain (CARDIA)    Difficulty of Paying Living Expenses: Not very hard  Food Insecurity: No Food Insecurity (10/15/2017)   Hunger Vital Sign    Worried About Running Out of Food in the Last Year: Never true    Ran Out of Food in the Last Year: Never true  Transportation Needs: Unknown (10/15/2017)   PRAPARE - Transportation    Lack of Transportation (Medical): Patient declined    Lack of Transportation (Non-Medical): Patient declined  Physical Activity: Inactive (10/15/2017)   Exercise Vital Sign    Days of Exercise per Week: 0 days    Minutes of Exercise per Session: 0 min  Stress: No Stress Concern Present (10/15/2017)   [employer] of Occupational Health - Occupational Stress Questionnaire    Feeling of Stress : Not at all  Social Connections: Unknown (10/15/2017)   Social Connection and Isolation Panel [NHANES]    Frequency of Communication with Friends and Family: Patient declined    Frequency of Social Gatherings with Friends and Family: Patient declined    Attends Religious Services: Patient declined    Database administrator or Organizations: Patient declined    Attends Banker Meetings: Patient declined    Marital Status: Patient declined  Intimate Partner Violence: Not At Risk (10/15/2017)   Humiliation, Afraid, Rape, and Kick questionnaire    Fear of Current or Ex-Partner: No    Emotionally Abused: No    Physically Abused: No    Sexually Abused: No   Family History  Problem Relation Age of Onset   Emphysema Mother    Aneurysm Mother    Emphysema Father    Coronary artery disease Brother    Coronary artery disease Brother    Coronary artery disease  Sister    ROS: All systems reviewed and negative except as per HPI.   Current Outpatient Medications  Medication Sig Dispense Refill   ASPIRIN 81 PO Take by mouth daily.     atorvastatin (LIPITOR) 40 MG tablet Take 1 tablet (40 mg total) by mouth every evening. 30 tablet 11   carvedilol (COREG) 12.5 MG tablet TAKE ONE TABLET BY MOUTH TWICE A DAY 180 tablet 3   finasteride (PROSCAR) 5 MG tablet Take 5 mg by mouth daily.     gabapentin (NEURONTIN) 300 MG capsule Take 300 mg by mouth 2 (two) times daily.     Iron Combinations (IRON COMPLEX PO) Take 1 tablet by mouth daily.     JARDIANCE 10 MG TABS tablet TAKE 1 TABLET BY MOUTH DAILY BEFORE BREAKFAST 90 tablet 2   KLOR-CON M20 20 MEQ tablet TAKE 2 TABLETS BY MOUTH DAILY 60 tablet  6   Loratadine (ALLERGY RELIEF 24-HR PO) Take by mouth daily.     metFORMIN (GLUCOPHAGE) 1000 MG tablet Take 0.5 tablets (500 mg total) by mouth 2 (two) times daily with a meal. 30 tablet 3   metolazone (ZAROXOLYN) 2.5 MG tablet TAKE 1 TABLET BY MOUTH ONCE A WEEK ON THURSDAYS 5 tablet 3   montelukast (SINGULAIR) 10 MG tablet Take 10 mg by mouth daily.      Multiple Vitamin (MULTIVITAMIN) tablet Take 1 tablet by mouth daily.     pantoprazole (PROTONIX) 40 MG tablet Take 1 tablet (40 mg total) by mouth daily. 30 tablet 1   SYMBICORT 80-4.5 MCG/ACT inhaler Inhale 2 puffs into the lungs daily as needed (wheezing).     tamsulosin (FLOMAX) 0.4 MG CAPS capsule Take 0.4 mg by mouth daily.     torsemide (DEMADEX) 20 MG tablet Take 80 mg by mouth 2 (two) times daily.     VENTOLIN HFA 108 (90 Base) MCG/ACT inhaler Inhale 2 puffs into the lungs every 6 (six) hours as needed for wheezing or shortness of breath.      spironolactone (ALDACTONE) 25 MG tablet Take 0.5 tablets (12.5 mg total) by mouth daily. 45 tablet 3   No current facility-administered medications for this encounter.   BP 110/70   Pulse 71   Wt 92.1 kg (203 lb)   SpO2 95%   BMI 31.79 kg/m    Wt Readings from  Last 3 Encounters:  04/22/23 92.1 kg (203 lb)  10/16/22 89.1 kg (196 lb 6.4 oz)  08/30/22 88.8 kg (195 lb 12.8 oz)   General: NAD Neck: JVP 8 cm, no thyromegaly or thyroid nodule.  Lungs: Clear to auscultation bilaterally with normal respiratory effort. CV: Nondisplaced PMI.  Heart regular S1/S2, no S3/S4, no murmur.  Trace ankle edema.  No carotid bruit.  Unable to palpate pedal pulses.  Abdomen: Soft, nontender, no hepatosplenomegaly, no distention.  Skin: Intact without lesions or rashes.  Neurologic: Alert and oriented x 3.  Psych: Normal affect. Extremities: No clubbing or cyanosis. Lower leg varicose veins.  HEENT: Normal.   Assessment/Plan: 1. Chronic systolic CHF:  Nonischemic cardiomyopathy.  Echo 4/20 with EF 45-50%.  Cath in 9/12 showed nonobstructive coronary disease.  S/p Cardiomems implant 11/20. Echo (6/21) with EF 45-50%, grade I DD, RV normal, mild to moderate TR. Echo (3/24) showed EF 35-40% with paradoxical septal motion. Cardiac MRI in 7/24 showed mild LV dilation with EF 38%, septal-lateral dyssynchrony, RV EF 47%, nonspecific inferior RV insertion site LGE (not suggestive of coronary disease), moderate MR. NYHA class II symptoms, Cardiomems reading slightly elevated.  Not markedly volume overloaded on exam.  - Continue torsemide 80 mg bid + metolazone 2.5 mg once a week. BMET/BNP today.  - Continue Coreg 12.5 mg bid.  - Continue Jardiance 10 mg daily.  - If creatinine is stable today, I will increase spironolactone to 25 mg daily with BMET in 10 days.  - He did not tolerate Entresto in the past.  If creatinine is stable today, I will add valsartan 20 mg daily with BMET in 10 days.  - Continue to follow Cardiomems.  - EF was > 35% on cardiac MRI.  If EF declines further, he would be CRT candidate with wide LBBB.  2. [name]: Stage 3. BMET today.  3. OSA: On Bipap. Continue nightly.  4. Lower extremity ulcerations: Possibly venous stasis.  - I will arrange for peripheral  arterial doppler evaluation.  5. Ascending aortic aneurysm:  CT chest 6/20 with 5.1 cm ascending aortic aneurysm.  CT chest w/o contrast 12/23 with 4.9 cm ascending aorta.  - Repeat CT chest w/o contrast in 12/24.  7. Hyperlipidemia: Goal LDL < 70 with diabetes.  - Good lipids 2/24   Follow up in 6 wks with APP.   [name]  04/22/2023

## 2023-04-22 NOTE — Telephone Encounter (Signed)
-----   Message from Marca Ancona sent at 04/22/2023  4:38 PM EDT ----- Creatinine significantly higher at 2.5.  Please call, have him keep spironolactone at 12.5 mg daily rather than increasing, and do not add valsartan 20 mg daily to regimen (had planned on adding today).  Triglycerides high but focus on diet for now.

## 2023-04-22 NOTE — Patient Instructions (Signed)
START Valsartan 20 mg ( 1/2 Tab) nightly.  INCREASE Spironolactone to 25 mg daily.  Labs done today, your results will be available in MyChart, we will contact you for abnormal readings.  PLEASE CALL THE RADIOLOGY DEPARTMENT AND HAVE YOUR CT SCAN RESCHEDULED.  Your provider has ordered ultra sound of your legs. You will be called to have this test arranged.  Your physician recommends that you schedule a follow-up appointment in: 6 weeks.  If you have any questions or concerns before your next appointment please send [place] a message through [place] or call our office at [phone].    TO LEAVE A MESSAGE FOR THE NURSE SELECT OPTION 2, PLEASE LEAVE A MESSAGE INCLUDING: YOUR NAME DATE OF BIRTH CALL BACK NUMBER REASON FOR CALL**this is important as we prioritize the call backs  YOU WILL RECEIVE A CALL BACK THE SAME DAY AS LONG AS YOU CALL BEFORE 4:00 PM  At the [facility], you and your health needs are our priority. As part of our continuing mission to provide you with exceptional heart care, we have created designated Provider Care Teams. These Care Teams include your primary Cardiologist (physician) and Advanced Practice Providers (APPs- Physician Assistants and Nurse Practitioners) who all work together to provide you with the care you need, when you need it.   You may see any of the following providers on your designated Care Team at your next follow up: Dr Arvilla Meres Dr Marca Ancona Dr. Dorthula Nettles Dr. Clearnce Hasten Amy Filbert Schilder, NP Robbie Lis, [place] [facility] [place], [place] Brynda Peon, NP [place] Lee, NP Karle Plumber, PharmD   Please be sure to bring in all your medications bottles to every appointment.    Thank you for choosing [facility]

## 2023-04-22 NOTE — Telephone Encounter (Signed)
Patients wife advised and verbalized understanding. Med list updated to reflect changes.   Meds ordered this encounter  Medications   spironolactone (ALDACTONE) 25 MG tablet    Sig: Take 0.5 tablets (12.5 mg total) by mouth daily.    Dispense:  45 tablet    Refill:  3    D/C Valsartan. Please cancel all previous orders for current medication. Change in dosage or pill size. D/C Valsartan

## 2023-05-01 ENCOUNTER — Ambulatory Visit (HOSPITAL_COMMUNITY)
Admission: RE | Admit: 2023-05-01 | Discharge: 2023-05-01 | Disposition: A | Discharge status: Home / Self Care | Payer: Medicare HMO | Source: Ambulatory Visit | Attending: Cardiology | Admitting: Cardiology

## 2023-05-01 DIAGNOSIS — I5022 Chronic systolic (congestive) heart failure: Secondary | ICD-10-CM | POA: Diagnosis present

## 2023-05-01 DIAGNOSIS — I83893 Varicose veins of bilateral lower extremities with other complications: Secondary | ICD-10-CM | POA: Diagnosis not present

## 2023-05-03 LAB — VAS US ABI WITH/WO TBI
Left ABI: 1.17
Right ABI: 1.12

## 2023-05-27 ENCOUNTER — Encounter: Payer: Self-pay | Admitting: Podiatry

## 2023-05-27 ENCOUNTER — Ambulatory Visit (INDEPENDENT_AMBULATORY_CARE_PROVIDER_SITE_OTHER): Payer: Medicare HMO | Admitting: Podiatry

## 2023-05-27 DIAGNOSIS — M79674 Pain in right toe(s): Secondary | ICD-10-CM | POA: Diagnosis not present

## 2023-05-27 DIAGNOSIS — M79675 Pain in left toe(s): Secondary | ICD-10-CM

## 2023-05-27 DIAGNOSIS — B351 Tinea unguium: Secondary | ICD-10-CM

## 2023-05-27 DIAGNOSIS — E119 Type 2 diabetes mellitus without complications: Secondary | ICD-10-CM | POA: Diagnosis not present

## 2023-05-27 NOTE — Progress Notes (Signed)
This patient returns to my office for at risk foot care.  This patient requires this care by a professional since this patient will be at risk due to having diabetes type 2 and chronic venous insufficiency.  This patient is unable to cut nails himself since the patient cannot reach his nails.These nails are painful walking and wearing shoes.  This patient presents for at risk foot care today.  General Appearance  Alert, conversant and in no acute stress.  Vascular  Dorsalis pedis and posterior tibial  pulses are not  palpable  bilaterally.  Capillary return is within normal limits  bilaterally. Temperature is within normal limits  bilaterally.  Neurologic  Senn-Weinstein monofilament wire test diminished/absent  bilaterally. Muscle power within normal limits bilaterally.  Nails Thick disfigured discolored nails with subungual debris  from hallux to fifth toes bilaterally. No evidence of bacterial infection or drainage bilaterally.  Orthopedic  No limitations of motion  feet .  No crepitus or effusions noted.  No bony pathology or digital deformities noted.  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  Midfoot  DJD  B/L.  Limited rearfoot  ROM  B/L.  Skin  normotropic skin with no porokeratosis noted bilaterally.  No signs of infections or ulcers noted.     Onychomycosis  Pain in right toes  Pain in left toes  Consent was obtained for treatment procedures.   Mechanical debridement of nails 1-5  bilaterally performed with a nail nipper.  Filed with dremel without incident.    Return office visit    3 months                  Told patient to return for periodic foot care and evaluation due to potential at risk complications.   Helane Gunther DPM

## 2023-06-06 ENCOUNTER — Encounter (HOSPITAL_COMMUNITY): Payer: Self-pay

## 2023-06-06 ENCOUNTER — Ambulatory Visit (HOSPITAL_COMMUNITY)
Admission: RE | Admit: 2023-06-06 | Discharge: 2023-06-06 | Disposition: A | Discharge status: Home / Self Care | Payer: Medicare HMO | Source: Ambulatory Visit | Attending: Cardiology | Admitting: Cardiology

## 2023-06-06 VITALS — BP 122/66 | HR 81 | Wt 211.2 lb

## 2023-06-06 DIAGNOSIS — L97921 Non-pressure chronic ulcer of unspecified part of left lower leg limited to breakdown of skin: Secondary | ICD-10-CM | POA: Diagnosis not present

## 2023-06-06 DIAGNOSIS — I5022 Chronic systolic (congestive) heart failure: Secondary | ICD-10-CM | POA: Insufficient documentation

## 2023-06-06 DIAGNOSIS — E118 Type 2 diabetes mellitus with unspecified complications: Secondary | ICD-10-CM

## 2023-06-06 DIAGNOSIS — E785 Hyperlipidemia, unspecified: Secondary | ICD-10-CM | POA: Diagnosis not present

## 2023-06-06 DIAGNOSIS — N184 Chronic kidney disease, stage 4 (severe): Secondary | ICD-10-CM | POA: Insufficient documentation

## 2023-06-06 DIAGNOSIS — G4733 Obstructive sleep apnea (adult) (pediatric): Secondary | ICD-10-CM | POA: Insufficient documentation

## 2023-06-06 DIAGNOSIS — E11621 Type 2 diabetes mellitus with foot ulcer: Secondary | ICD-10-CM | POA: Insufficient documentation

## 2023-06-06 DIAGNOSIS — I130 Hypertensive heart and chronic kidney disease with heart failure and stage 1 through stage 4 chronic kidney disease, or unspecified chronic kidney disease: Secondary | ICD-10-CM | POA: Insufficient documentation

## 2023-06-06 DIAGNOSIS — Z79899 Other long term (current) drug therapy: Secondary | ICD-10-CM | POA: Insufficient documentation

## 2023-06-06 DIAGNOSIS — Z7984 Long term (current) use of oral hypoglycemic drugs: Secondary | ICD-10-CM | POA: Diagnosis not present

## 2023-06-06 DIAGNOSIS — I7121 Aneurysm of the ascending aorta, without rupture: Secondary | ICD-10-CM | POA: Diagnosis not present

## 2023-06-06 DIAGNOSIS — I428 Other cardiomyopathies: Secondary | ICD-10-CM | POA: Insufficient documentation

## 2023-06-06 DIAGNOSIS — E1122 Type 2 diabetes mellitus with diabetic chronic kidney disease: Secondary | ICD-10-CM | POA: Insufficient documentation

## 2023-06-06 LAB — BASIC METABOLIC PANEL
Anion gap: 12 (ref 5–15)
BUN: 41 mg/dL — ABNORMAL HIGH (ref 8–23)
CO2: 30 mmol/L (ref 22–32)
Calcium: 8.8 mg/dL — ABNORMAL LOW (ref 8.9–10.3)
Chloride: 95 mmol/L — ABNORMAL LOW (ref 98–111)
Creatinine, Ser: 1.99 mg/dL — ABNORMAL HIGH (ref 0.61–1.24)
GFR, Estimated: 35 mL/min — ABNORMAL LOW (ref >60)
Glucose, Bld: 195 mg/dL — ABNORMAL HIGH (ref 70–99)
Potassium: 4.2 mmol/L (ref 3.5–5.1)
Sodium: 137 mmol/L (ref 135–145)

## 2023-06-06 LAB — LIPID PANEL
Cholesterol: 143 mg/dL (ref 0–200)
HDL: 38 mg/dL — ABNORMAL LOW (ref >40)
LDL Cholesterol: 70 mg/dL (ref 0–99)
Total CHOL/HDL Ratio: 3.8 {ratio}
Triglycerides: 175 mg/dL — ABNORMAL HIGH (ref <150)
VLDL: 35 mg/dL (ref 0–40)

## 2023-06-06 LAB — BRAIN NATRIURETIC PEPTIDE: B Natriuretic Peptide: 226.8 pg/mL — ABNORMAL HIGH (ref 0.0–100.0)

## 2023-06-06 NOTE — Progress Notes (Signed)
[facility] Clinic Note  PCP: Loura Back, NP HF Cardiology: Dr. Shirlee Latch  Reason for Visit: Follow up for Systolic Heart Failure   [name], [name], [name] for followup of CHF.  He has a history of mild LV systolic dysfunction (EF 45-50% range) with probably significant RV failure.  Patient was admitted in 4/20 with hypoxic respiratory failure due to PNA and decompensated HF.  Hospitalization was complicated by PEA arrest and AKI.   In 6/20, he was again admitted with hypoxemic respiratory failure with hypertensive crisis, CHF, and PNA.    Due to frequent hospital admission for CHF, underwent Cardiomems implantation 11/20.  RHC at that time showed optimized filling pressures after adjustment of torsemide. Outpatient sleep study found to have severe OSA and is now using Bipap. He was also referred to HF paramedicine program.   Echo 6/21 showed EF 45-50%, grade I DD, RV normal, mild to moderate TR.   Last seen 01/2020. Discharged from paramedicine 9/21 after demonstrating competency managing his medications. Unfortunately lost to follow up but has been using Cardiomems regularly.  Follow up 10/23, we had not seen him since 01/2020. NYHA II-early III symptoms and volume overloaded. Torsemide increased to 80 bid and metolazone 2.5 mg added weekly.  Echo 3/24 showed EF 35-40% with paradoxical septal motion, consistent with LBBB, grade I DD, normal RV, mild to moderate AI, AoRoot dilation 41 mm, ascending aorta 45 mm.   cMRI 7/24 showed mild LV dilation with EF 38%, septal-lateral dyssynchrony, RV EF 47%, nonspecific inferior RV insertion site LGE, moderate MR.   Today he returns for HF follow up with his wife. Overall feeling okay. Complains for new cough that started yesterday, has been made worse by the heat going out in their home, its in the process of being fixed. Denies increasing SOB performing ADLs, however easily fatigued. Sleeping on 2  pillows with BiPAP at night. Per patient and wife good response to diuretics. Endorses mild low back pain in the morning related to lying on back. No CP or dizziness. Appetite good. No fever or chills. Does not weight daily or have a scale at home. States that their grandchild has an eating disorder and do not want a scale in their house. Taking all medications as prescribed.  Cardiomems PADP goal 21: Cardiomems reading 28.  Labs (4/20): LDL 91 Labs (9/20): K 4, creatinine 1.95 => 2.1 Labs (10/20): K 4.3, creatinine 1.8 Labs (11/20): K 4.9, creatinine 1.73 Labs (2/21): K 4.1, creatinine 1.9 Labs (3/21): K 3.9, creatinine 1.75 Labs (9/23): K 4.0, creatinine 1.54 Labs (11/23): K 3.7, creatinine 1.87 => 1.95 Labs (2/24): LDL 65 Labs (3/24): K 4.1, creatinine 1.85 Labs (5/24): K 4.5, creatinine 1.99 Labs (10/24): K 4.1, creatitine 2.51, TG 249, LDL 78  PMH: 1. HTN 2. [name] stage 3 3. Type 2 diabetes 4. Hyperlipidemia 5. COPD: Suspected 6. PNA: Recurrent, suspected aspiration.  7. PEA arrest 4/20: Respiratory arrest.  8. LBBB 9. TIA 10. Ascending aortic aneurysm: CT chest in 6/20 with 5.1 cm ascending aortic aneurysm. - CT chest w/o contrast (12/23): 4.9 cm ascending aorta.  11. 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  - Echo (12/19): EF 45-50%.  - Echo (4/20): EF 45-50%, diffuse hypokinesis, normal RV.  - Cardiomems placement (11/20). - RHC (11/20): mean RA 2, PA 33/8, mean PCWP 11, CI 3.32 - Echo (6/21): EF 45-50%, grade I DD, RV normal, mild to moderate  TR - Echo (3/24): EF 35-40%, evidence of LBBB - cMRI (7/24): showed mild LV dilation with EF 38%, septal-lateral dyssynchrony, RV EF 47%, nonspecific inferior RV insertion site LGE, moderate MR.  12. Ventricular ectopy: 8/20 monitor showed PVCs and NSVT.  13. Peripheral arterial dopplers (10/20): No significant stenosis.   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 OSA: Bipap  Social History    Socioeconomic History   Marital status: Married    Spouse name: Not on file   Number of children: Not on file   Years of education: Not on file   Highest education level: Not on file  Occupational History   Not on file  Tobacco Use   Smoking status: Never   Smokeless tobacco: Never  Vaping Use   Vaping status: Never Used  Substance and Sexual Activity   Alcohol use: No   Drug use: No   Sexual activity: Not Currently    Partners: Female    Birth control/protection: None  Other Topics Concern   Not on file  Social History Narrative   Patient lives in private residence with supportive spouse   Social Determinants of Health   Financial Resource Strain: Low Risk  (10/15/2017)   Overall Financial Resource Strain (CARDIA)    Difficulty of Paying Living Expenses: Not very hard  Food Insecurity: No Food Insecurity (10/15/2017)   Hunger Vital Sign    Worried About Running Out of Food in the Last Year: Never true    Ran Out of Food in the Last Year: Never true  Transportation Needs: Unknown (10/15/2017)   PRAPARE - Transportation    Lack of Transportation (Medical): Patient declined    Lack of Transportation (Non-Medical): Patient declined  Physical Activity: Inactive (10/15/2017)   Exercise Vital Sign    Days of Exercise per Week: 0 days    Minutes of Exercise per Session: 0 min  Stress: No Stress Concern Present (10/15/2017)   [employer] of Occupational Health - Occupational Stress Questionnaire    Feeling of Stress : Not at all  Social Connections: Unknown (10/15/2017)   Social Connection and Isolation Panel [NHANES]    Frequency of Communication with Friends and Family: Patient declined    Frequency of Social Gatherings with Friends and Family: Patient declined    Attends Religious Services: Patient declined    Database administrator or Organizations: Patient declined    Attends Banker Meetings: Patient declined    Marital Status: Patient declined   Intimate Partner Violence: Not At Risk (10/15/2017)   Humiliation, Afraid, Rape, and Kick questionnaire    Fear of Current or Ex-Partner: No    Emotionally Abused: No    Physically Abused: No    Sexually Abused: No   Family History  Problem Relation Age of Onset   Emphysema Mother    Aneurysm Mother    Emphysema Father    Coronary artery disease Brother    Coronary artery disease Brother    Coronary artery disease Sister    ROS: All systems reviewed and negative except as per HPI.   Current Outpatient Medications  Medication Sig Dispense Refill   ASPIRIN 81 PO Take by mouth daily.     atorvastatin (LIPITOR) 40 MG tablet Take 1 tablet (40 mg total) by mouth every evening. 30 tablet 11   carvedilol (COREG) 12.5 MG tablet TAKE ONE TABLET BY MOUTH TWICE A DAY 180 tablet 3   finasteride (PROSCAR) 5 MG tablet Take 5 mg by mouth  daily.     gabapentin (NEURONTIN) 300 MG capsule Take 300 mg by mouth 2 (two) times daily.     Iron Combinations (IRON COMPLEX PO) Take 1 tablet by mouth daily.     JARDIANCE 10 MG TABS tablet TAKE 1 TABLET BY MOUTH DAILY BEFORE BREAKFAST 90 tablet 2   KLOR-CON M20 20 MEQ tablet TAKE 2 TABLETS BY MOUTH DAILY 60 tablet 6   Loratadine (ALLERGY RELIEF 24-HR PO) Take 1 tablet by mouth daily.     metFORMIN (GLUCOPHAGE) 1000 MG tablet Take 0.5 tablets (500 mg total) by mouth 2 (two) times daily with a meal. 30 tablet 3   metolazone (ZAROXOLYN) 2.5 MG tablet TAKE 1 TABLET BY MOUTH ONCE A WEEK ON THURSDAYS 5 tablet 3   montelukast (SINGULAIR) 10 MG tablet Take 10 mg by mouth daily.      Multiple Vitamin (MULTIVITAMIN) tablet Take 1 tablet by mouth daily.     pantoprazole (PROTONIX) 40 MG tablet Take 1 tablet (40 mg total) by mouth daily. 30 tablet 1   spironolactone (ALDACTONE) 25 MG tablet Take 0.5 tablets (12.5 mg total) by mouth daily. 45 tablet 3   SYMBICORT 80-4.5 MCG/ACT inhaler Inhale 2 puffs into the lungs daily as needed (wheezing).     tamsulosin (FLOMAX) 0.4  MG CAPS capsule Take 0.4 mg by mouth daily.     torsemide (DEMADEX) 20 MG tablet Take 80 mg by mouth 2 (two) times daily.     VENTOLIN HFA 108 (90 Base) MCG/ACT inhaler Inhale 2 puffs into the lungs every 6 (six) hours as needed for wheezing or shortness of breath.      No current facility-administered medications for this encounter.   BP 122/66   Pulse 81   Wt 95.8 kg (211 lb 3.2 oz)   SpO2 94%   BMI 33.08 kg/m    Wt Readings from Last 3 Encounters:  06/06/23 95.8 kg (211 lb 3.2 oz)  04/22/23 92.1 kg (203 lb)  10/16/22 89.1 kg (196 lb 6.4 oz)   Physical Exam: General:  Disheveled appearing. No resp difficulty. Walked into clinic with cane HEENT: normal Neck: supple. JVP 10cm. Carotids 2+ bilat; no bruits. No lymphadenopathy or thryomegaly appreciated. Cor: PMI nondisplaced. Regular rate & rhythm. No rubs, gallops. Lungs: coarse, crackles in bases Abdomen: taut, distended. No hepatosplenomegaly. No bruits or masses. Good bowel sounds. Extremities: no cyanosis, clubbing, rash. Ruddy BLE w 2+ edema Neuro: alert & orientedx3, garbled speech (baseline). moves all 4 extremities w/o difficulty. Affect pleasant   Assessment/Plan: 1. Chronic systolic CHF:  Nonischemic cardiomyopathy.  Echo 4/20 with EF 45-50%.  Cath in 9/12 showed nonobstructive coronary disease.  S/p Cardiomems implant 11/20. Echo (6/21) with EF 45-50%, grade I DD, RV normal, mild to moderate TR. Echo (3/24) showed EF 35-40% with paradoxical septal motion. Cardiac MRI in 7/24 showed mild LV dilation with EF 38%, septal-lateral dyssynchrony, RV EF 47%, nonspecific inferior RV insertion site LGE (not suggestive of coronary disease), moderate MR. Mod AI. NYHA class II symptoms, Cardiomems reading elevated. Volume overloaded on exam. Continues weight gain.  - Increase Torsemide to 100 bid x3 day. Then resume torsemide 80 mg bid + metolazone 2.5 mg once a week (takes Saturday). - Encouraged to use ACE wraps to B/L calves for  edema - Continue Coreg 12.5 mg bid.  - Continue Jardiance 10 mg daily.  - Continue Spiro 12.5 daily. If sCr still significantly elevated will stop. - Unable to add ARB/ARNi due to elevated creatinine. Repeat  BMET today. - Continue to follow Cardiomems.  - EF was > 35% on cardiac MRI.  If EF declines further, he would be CRT candidate with wide LBBB.  2. [name]: Stage IV. BMET today.  - Referral to nephrology 3. OSA: On Bipap. Continue nightly.  4. Lower extremity ulcerations: Possibly venous stasis. Followed by podiatry. 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  5. Ascending aortic aneurysm: CT chest 6/20 with 5.1 cm ascending aortic aneurysm.  CT chest w/o contrast 12/23 with 4.9 cm ascending aorta.  - Repeat CT chest w/o contrast in 12/24. CTS referral pending results. 6. Hyperlipidemia: Goal LDL < 70 with diabetes.  - LDL 78, Triglycerides 249. Repeat today. 7. Diabetes Type 2: A1c 8.5 (10/24).  - On Metformin - On Jardiance   Follow up in 7-10 days with APP.   [place] Itzae Mccurdy, NP 06/06/2023

## 2023-06-06 NOTE — Patient Instructions (Addendum)
Labs done today. We will contact you only if your labs are abnormal.  INCREASE Torsemide to 100mg  (5 tablets) by mouth 2 times daily for 3 days.   No other medication changes were made. Please continue all current medications as prescribed.  Your physician recommends that you schedule a follow-up appointment in: 7-10 days   If you have any questions or concerns before your next appointment please send [place] a message through [place] or call our office at [phone].    TO LEAVE A MESSAGE FOR THE NURSE SELECT OPTION 2, PLEASE LEAVE A MESSAGE INCLUDING: YOUR NAME DATE OF BIRTH CALL BACK NUMBER REASON FOR CALL**this is important as we prioritize the call backs  YOU WILL RECEIVE A CALL BACK THE SAME DAY AS LONG AS YOU CALL BEFORE 4:00 PM   Do the following things EVERYDAY: Weigh yourself in the morning before breakfast. Write it down and keep it in a log. Take your medicines as prescribed Eat low salt foods--Limit salt (sodium) to 2000 mg per day.  Stay as active as you can everyday Limit all fluids for the day to less than 2 liters   At the [facility], you and your health needs are our priority. As part of our continuing mission to provide you with exceptional heart care, we have created designated Provider Care Teams. These Care Teams include your primary Cardiologist (physician) and Advanced Practice Providers (APPs- Physician Assistants and Nurse Practitioners) who all work together to provide you with the care you need, when you need it.   You may see any of the following providers on your designated Care Team at your next follow up: Dr Arvilla Meres Dr Marca Ancona Dr. Marcos Eke, NP Robbie Lis, [place] [facility] [place], [place] Brynda Peon, NP Karle Plumber, PharmD   Please be sure to bring in all your medications bottles to every appointment.    Thank you for choosing [facility]

## 2023-06-14 ENCOUNTER — Telehealth (HOSPITAL_COMMUNITY): Payer: Self-pay | Admitting: Adult Health

## 2023-06-14 NOTE — Telephone Encounter (Signed)
LMOM

## 2023-06-14 NOTE — Telephone Encounter (Signed)
  Cardiomems Remote Monitoring  S/P Cardiomems Implant   PAD Goal: 21 Most recent reading: 30 suggestive of fluid accumulation    Recommended changes: Please call instruct to take an 20 mg of torsemide for the next 3 days. He has HF follow up 06/17/23 and will need BMET at that time. Reinforce low sodium diet.   I continue to review and analyze the patients PA pressures weekly (and more often as needed) to bring PA pressures within the optimal range.    Pleae call.   Sheina Mcleish NP-C  2:33 PM

## 2023-06-19 ENCOUNTER — Ambulatory Visit (HOSPITAL_COMMUNITY)
Admission: RE | Admit: 2023-06-19 | Discharge: 2023-06-19 | Disposition: A | Discharge status: Home / Self Care | Payer: Medicare HMO | Source: Ambulatory Visit | Attending: Physician Assistant | Admitting: Physician Assistant

## 2023-06-19 VITALS — BP 122/78 | HR 60 | Wt 205.4 lb

## 2023-06-19 DIAGNOSIS — E1122 Type 2 diabetes mellitus with diabetic chronic kidney disease: Secondary | ICD-10-CM | POA: Insufficient documentation

## 2023-06-19 DIAGNOSIS — E11621 Type 2 diabetes mellitus with foot ulcer: Secondary | ICD-10-CM | POA: Diagnosis not present

## 2023-06-19 DIAGNOSIS — I428 Other cardiomyopathies: Secondary | ICD-10-CM | POA: Insufficient documentation

## 2023-06-19 DIAGNOSIS — I130 Hypertensive heart and chronic kidney disease with heart failure and stage 1 through stage 4 chronic kidney disease, or unspecified chronic kidney disease: Secondary | ICD-10-CM | POA: Insufficient documentation

## 2023-06-19 DIAGNOSIS — Z7984 Long term (current) use of oral hypoglycemic drugs: Secondary | ICD-10-CM | POA: Diagnosis not present

## 2023-06-19 DIAGNOSIS — Z8673 Personal history of transient ischemic attack (TIA), and cerebral infarction without residual deficits: Secondary | ICD-10-CM | POA: Insufficient documentation

## 2023-06-19 DIAGNOSIS — Z8674 Personal history of sudden cardiac arrest: Secondary | ICD-10-CM | POA: Diagnosis not present

## 2023-06-19 DIAGNOSIS — E785 Hyperlipidemia, unspecified: Secondary | ICD-10-CM | POA: Insufficient documentation

## 2023-06-19 DIAGNOSIS — I5032 Chronic diastolic (congestive) heart failure: Secondary | ICD-10-CM | POA: Diagnosis not present

## 2023-06-19 DIAGNOSIS — G4733 Obstructive sleep apnea (adult) (pediatric): Secondary | ICD-10-CM | POA: Diagnosis not present

## 2023-06-19 DIAGNOSIS — I7121 Aneurysm of the ascending aorta, without rupture: Secondary | ICD-10-CM | POA: Insufficient documentation

## 2023-06-19 DIAGNOSIS — N184 Chronic kidney disease, stage 4 (severe): Secondary | ICD-10-CM | POA: Insufficient documentation

## 2023-06-19 DIAGNOSIS — I2510 Atherosclerotic heart disease of native coronary artery without angina pectoris: Secondary | ICD-10-CM | POA: Diagnosis not present

## 2023-06-19 DIAGNOSIS — I5042 Chronic combined systolic (congestive) and diastolic (congestive) heart failure: Secondary | ICD-10-CM | POA: Insufficient documentation

## 2023-06-19 DIAGNOSIS — Z79899 Other long term (current) drug therapy: Secondary | ICD-10-CM | POA: Diagnosis not present

## 2023-06-19 LAB — BASIC METABOLIC PANEL
Anion gap: 14 (ref 5–15)
BUN: 56 mg/dL — ABNORMAL HIGH (ref 8–23)
CO2: 27 mmol/L (ref 22–32)
Calcium: 8.6 mg/dL — ABNORMAL LOW (ref 8.9–10.3)
Chloride: 94 mmol/L — ABNORMAL LOW (ref 98–111)
Creatinine, Ser: 2.26 mg/dL — ABNORMAL HIGH (ref 0.61–1.24)
GFR, Estimated: 30 mL/min — ABNORMAL LOW (ref >60)
Glucose, Bld: 231 mg/dL — ABNORMAL HIGH (ref 70–99)
Potassium: 3.7 mmol/L (ref 3.5–5.1)
Sodium: 135 mmol/L (ref 135–145)

## 2023-06-19 NOTE — Patient Instructions (Signed)
Medication Changes:  None, continue current medications  Lab Work:  Labs done today we will call you for abnormal results  Special Instructions // Education:  Do the following things EVERYDAY: Weigh yourself in the morning before breakfast. Write it down and keep it in a log. Take your medicines as prescribed Eat low salt foods--Limit salt (sodium) to 2000 mg per day.  Stay as active as you can everyday Limit all fluids for the day to less than 2 liters Do Cardiomems reading daily  Follow-Up in: 3 months   At the [facility], you and your health needs are our priority. We have a designated team specialized in the treatment of Heart Failure. This Care Team includes your primary Heart Failure Specialized Cardiologist (physician), Advanced Practice Providers (APPs- Physician Assistants and Nurse Practitioners), and Pharmacist who all work together to provide you with the care you need, when you need it.   You may see any of the following providers on your designated Care Team at your next follow up:  Dr. Arvilla Meres Dr. Marca Ancona Dr. Dorthula Nettles Dr. Theresia Bough Tonye Becket, NP Robbie Lis, [place] [facility] [place], [place] Brynda Peon, NP [place] Lee, NP Karle Plumber, PharmD   Please be sure to bring in all your medications bottles to every appointment.   Need to Contact us:  If you have any questions or concerns before your next appointment please send [place] a message through [place] or call our office at [phone].    TO LEAVE A MESSAGE FOR THE NURSE SELECT OPTION 2, PLEASE LEAVE A MESSAGE INCLUDING: YOUR NAME DATE OF BIRTH CALL BACK NUMBER REASON FOR CALL**this is important as we prioritize the call backs  YOU WILL RECEIVE A CALL BACK THE SAME DAY AS LONG AS YOU CALL BEFORE 4:00 PM

## 2023-06-19 NOTE — Progress Notes (Signed)
[facility] Note  PCP: Loura Back, NP HF Cardiology: Dr. Shirlee Latch  Reason for Visit: Follow up for Systolic Heart Failure   [name], [name], [name] for followup of CHF.  He has a history of mild LV systolic dysfunction (EF 45-50% range) with probably significant RV failure.  Patient was admitted in 4/20 with hypoxic respiratory failure due to PNA and decompensated HF.  Hospitalization was complicated by PEA arrest and AKI.   In 6/20, he was again admitted with hypoxemic respiratory failure with hypertensive crisis, CHF, and PNA.    Due to frequent hospital admission for CHF, underwent Cardiomems implantation 11/20.  RHC at that time showed optimized filling pressures after adjustment of torsemide. Outpatient sleep study found to have severe OSA and is now using Bipap. He was also referred to HF paramedicine program.   Echo 6/21 showed EF 45-50%, grade I DD, RV normal, mild to moderate TR.   Discharged from paramedicine 9/21 after demonstrating competency managing his medications. Unfortunately lost to follow up but has been using Cardiomems regularly.  Follow up 10/23, we had not seen him since 01/2020. NYHA II-early III symptoms and volume overloaded. Torsemide increased to 80 bid and metolazone 2.5 mg added weekly.  Echo 3/24 showed EF 35-40% with paradoxical septal motion, consistent with LBBB, grade I DD, normal RV, mild to moderate AI, AoRoot dilation 41 mm, ascending aorta 45 mm.   cMRI 7/24 showed mild LV dilation with EF 38%, septal-lateral dyssynchrony, RV EF 47%, nonspecific inferior RV insertion site LGE, moderate MR.   Was seen recently on 12/5 and noted to be volume overloaded. Symptomatic w/ cough and orthopnea. CardioMEMs also suggetive of volume overload. dPAP was elevated at 28 (goal 21). He was instructed to increase Torsemide to 100 bid x3 day. Then resume torsemide 80 mg bid + metolazone 2.5 mg once a week (takes  Saturday).  He returns back today for repeat assessment. Here w/ his wife. Wt down from 211>>205 lb today. Compliant w/ daily CardioMEMs transmissions. Has been at or close to goal over the last wk. At exact goal of 21 mmHg today. Breathing and cough both improved. BP well controlled. Only complaint is continued difficulty tolerating CPAP mask.    Wt Readings from Last 3 Encounters:  06/19/23 93.2 kg (205 lb 6.4 oz)  06/06/23 95.8 kg (211 lb 3.2 oz)  04/22/23 92.1 kg (203 lb)      Cardiomems PADP goal 21: Cardiomems reading 21.  Labs (4/20): LDL 91 Labs (9/20): K 4, creatinine 1.95 => 2.1 Labs (10/20): K 4.3, creatinine 1.8 Labs (11/20): K 4.9, creatinine 1.73 Labs (2/21): K 4.1, creatinine 1.9 Labs (3/21): K 3.9, creatinine 1.75 Labs (9/23): K 4.0, creatinine 1.54 Labs (11/23): K 3.7, creatinine 1.87 => 1.95 Labs (2/24): LDL 65 Labs (3/24): K 4.1, creatinine 1.85 Labs (5/24): K 4.5, creatinine 1.99 Labs (10/24): K 4.1, creatitine 2.51, TG 249, LDL 78 Labs (12/24): K 4.2, creatinine 1.99, BNP 226   PMH: 1. HTN 2. [name] stage 3 3. Type 2 diabetes 4. Hyperlipidemia 5. COPD: Suspected 6. PNA: Recurrent, suspected aspiration.  7. PEA arrest 4/20: Respiratory arrest.  8. LBBB 9. TIA 10. Ascending aortic aneurysm: CT chest in 6/20 with 5.1 cm ascending aortic aneurysm. - CT chest w/o contrast (12/23): 4.9 cm ascending aorta.  11. 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  - Echo (12/19): EF 45-50%.  - Echo (4/20): EF 45-50%,  diffuse hypokinesis, normal RV.  - Cardiomems placement (11/20). - RHC (11/20): mean RA 2, PA 33/8, mean PCWP 11, CI 3.32 - Echo (6/21): EF 45-50%, grade I DD, RV normal, mild to moderate TR - Echo (3/24): EF 35-40%, evidence of LBBB - cMRI (7/24): showed mild LV dilation with EF 38%, septal-lateral dyssynchrony, RV EF 47%, nonspecific inferior RV insertion site LGE, moderate MR.  12. Ventricular ectopy: 8/20 monitor showed PVCs  and NSVT.  13. Peripheral arterial dopplers (10/20): No significant stenosis.   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 OSA: Bipap  Social History   Socioeconomic History   Marital status: Married    Spouse name: Not on file   Number of children: Not on file   Years of education: Not on file   Highest education level: Not on file  Occupational History   Not on file  Tobacco Use   Smoking status: Never   Smokeless tobacco: Never  Vaping Use   Vaping status: Never Used  Substance and Sexual Activity   Alcohol use: No   Drug use: No   Sexual activity: Not Currently    Partners: Female    Birth control/protection: None  Other Topics Concern   Not on file  Social History Narrative   Patient lives in private residence with supportive spouse   Social Drivers of Health   Financial Resource Strain: Low Risk  (10/15/2017)   Overall Financial Resource Strain (CARDIA)    Difficulty of Paying Living Expenses: Not very hard  Food Insecurity: No Food Insecurity (10/15/2017)   Hunger Vital Sign    Worried About Running Out of Food in the Last Year: Never true    Ran Out of Food in the Last Year: Never true  Transportation Needs: Unknown (10/15/2017)   PRAPARE - Transportation    Lack of Transportation (Medical): Patient declined    Lack of Transportation (Non-Medical): Patient declined  Physical Activity: Inactive (10/15/2017)   Exercise Vital Sign    Days of Exercise per Week: 0 days    Minutes of Exercise per Session: 0 min  Stress: No Stress Concern Present (10/15/2017)   [employer] of Occupational Health - Occupational Stress Questionnaire    Feeling of Stress : Not at all  Social Connections: Unknown (10/15/2017)   Social Connection and Isolation Panel [NHANES]    Frequency of Communication with Friends and Family: Patient declined    Frequency of Social Gatherings with Friends and Family: Patient declined    Attends Religious Services: Patient declined     Database administrator or Organizations: Patient declined    Attends Banker Meetings: Patient declined    Marital Status: Patient declined  Intimate Partner Violence: Not At Risk (10/15/2017)   Humiliation, Afraid, Rape, and Kick questionnaire    Fear of Current or Ex-Partner: No    Emotionally Abused: No    Physically Abused: No    Sexually Abused: No   Family History  Problem Relation Age of Onset   Emphysema Mother    Aneurysm Mother    Emphysema Father    Coronary artery disease Brother    Coronary artery disease Brother    Coronary artery disease Sister    ROS: All systems reviewed and negative except as per HPI.   Current Outpatient Medications  Medication Sig Dispense Refill   ASPIRIN 81 PO Take by mouth daily.     atorvastatin (LIPITOR) 40 MG tablet Take 1 tablet (40 mg total) by mouth  every evening. 30 tablet 11   carvedilol (COREG) 12.5 MG tablet TAKE ONE TABLET BY MOUTH TWICE A DAY 180 tablet 3   finasteride (PROSCAR) 5 MG tablet Take 5 mg by mouth daily.     gabapentin (NEURONTIN) 300 MG capsule Take 300 mg by mouth 2 (two) times daily.     Iron Combinations (IRON COMPLEX PO) Take 1 tablet by mouth daily.     JARDIANCE 10 MG TABS tablet TAKE 1 TABLET BY MOUTH DAILY BEFORE BREAKFAST 90 tablet 2   KLOR-CON M20 20 MEQ tablet TAKE 2 TABLETS BY MOUTH DAILY 60 tablet 6   Loratadine (ALLERGY RELIEF 24-HR PO) Take 1 tablet by mouth daily.     metFORMIN (GLUCOPHAGE) 1000 MG tablet Take 0.5 tablets (500 mg total) by mouth 2 (two) times daily with a meal. 30 tablet 3   metolazone (ZAROXOLYN) 2.5 MG tablet TAKE 1 TABLET BY MOUTH ONCE A WEEK ON THURSDAYS 5 tablet 3   montelukast (SINGULAIR) 10 MG tablet Take 10 mg by mouth daily.      Multiple Vitamin (MULTIVITAMIN) tablet Take 1 tablet by mouth daily.     pantoprazole (PROTONIX) 40 MG tablet Take 1 tablet (40 mg total) by mouth daily. 30 tablet 1   spironolactone (ALDACTONE) 25 MG tablet Take 0.5 tablets (12.5 mg  total) by mouth daily. 45 tablet 3   SYMBICORT 80-4.5 MCG/ACT inhaler Inhale 2 puffs into the lungs daily as needed (wheezing).     tamsulosin (FLOMAX) 0.4 MG CAPS capsule Take 0.4 mg by mouth daily.     torsemide (DEMADEX) 20 MG tablet Take 80 mg by mouth 2 (two) times daily.     VENTOLIN HFA 108 (90 Base) MCG/ACT inhaler Inhale 2 puffs into the lungs every 6 (six) hours as needed for wheezing or shortness of breath.      No current facility-administered medications for this visit.   There were no vitals taken for this visit.   Wt Readings from Last 3 Encounters:  06/06/23 95.8 kg (211 lb 3.2 oz)  04/22/23 92.1 kg (203 lb)  10/16/22 89.1 kg (196 lb 6.4 oz)   PHYSICAL EXAM: General:  disheveled appearing. No respiratory difficulty HEENT: normal Neck: supple. no JVD. Carotids 2+ bilat; no bruits. No lymphadenopathy or thyromegaly appreciated. Cor: PMI nondisplaced. Regular rate & rhythm. No rubs, gallops or murmurs. Lungs: clear Abdomen: soft, nontender, nondistended. No hepatosplenomegaly. No bruits or masses. Good bowel sounds. Extremities: no cyanosis, clubbing, rash, b/l varicose veins w/ chronic b/l venous stasis dermatitis  Neuro: alert & oriented x 3, cranial nerves grossly intact. moves all 4 extremities w/o difficulty. Affect pleasant.   Assessment/Plan: 1. Chronic systolic CHF:  Nonischemic cardiomyopathy.  Echo 4/20 with EF 45-50%.  Cath in 9/12 showed nonobstructive coronary disease.  S/p Cardiomems implant 11/20. Echo (6/21) with EF 45-50%, grade I DD, RV normal, mild to moderate TR. Echo (3/24) showed EF 35-40% with paradoxical septal motion. Cardiac MRI in 7/24 showed mild LV dilation with EF 38%, septal-lateral dyssynchrony, RV EF 47%, nonspecific inferior RV insertion site LGE (not suggestive of coronary disease), moderate MR. Mod AI. NYHA class II symptoms. CardioMEMs dPAP at goal at 21 mmHg. Euvolemic on exam - Continue torsemide 80 mg bid + metolazone 2.5 mg once a week  (takes Saturday). - Continue daily CardioMEMs transmissions  - Continue Coreg 12.5 mg bid.  - Continue Jardiance 10 mg daily.  - Continue Spiro 12.5 daily.  - Unable to add ARB/ARNi due to elevated creatinine.  -  Check BMP today  - EF was > 35% on cardiac MRI.  If EF declines further, he would be CRT candidate with wide LBBB.  2. [name]: Stage IV. B/l SCr ~2.0 - Check BMP today  - Referral to nephrology placed last appt. Awaiting scheduling  3. OSA: prescribed BiPAP. Not fully compliant - encouraged to improve compliance  4. Lower extremity ulcerations: 2/2 venous stasis. Followed by podiatry. Had b/l LE arterial dopplers 10/30 that showed normal ABIs  5. Ascending aortic aneurysm: CT chest 6/20 with 5.1 cm ascending aortic aneurysm.  CT chest w/o contrast 12/23 with 4.9 cm ascending aorta.  - Repeat CT chest w/o contrast has been ordered. CTS referral pending results. 6. Hyperlipidemia: Goal LDL < 70 with diabetes.  - recent LP 12/24 LDL at goal at 70 mmHg. - continue atorvastatin 40 mg  7. Diabetes Type 2: A1c 8.5 (10/24).  - On Metformin - On Jardiance   F/u w/ APP in 3 months. We will follow volume status remotely through CardioMEMs.   Robbie Lis, PA-C  06/19/2023

## 2023-06-21 NOTE — Telephone Encounter (Signed)
Pt seen in the office 12/18

## 2023-07-08 ENCOUNTER — Telehealth: Payer: Self-pay | Admitting: Adult Health

## 2023-07-08 NOTE — Telephone Encounter (Signed)
    Cardiomems Remote Monitoring  S/P Cardiomems Implant   PAD Goal: 21 Most recent reading: 26 suggestive of fluid accumulation   Recommended changes: Take extra 20 mg torsemide  for the next 3 days.   I continue to review and analyze the patients PA pressures weekly (and more often as needed) to bring PA pressures within the optimal range.     Esbeidy Mclaine NP-C  12:15 PM

## 2023-07-15 ENCOUNTER — Other Ambulatory Visit (HOSPITAL_COMMUNITY): Payer: Self-pay

## 2023-07-15 NOTE — Telephone Encounter (Signed)
 Pt aware via wife Reports pt has been without jardiance  for a while which is likely contributing to increase in fluid  Reports pt lost med after filling for a 90 day supply  Pharmacy team to assist with next fill date vs samples vs out of pocket until insurance can refill

## 2023-07-15 NOTE — Telephone Encounter (Signed)
 Per pharmacy team Next fill dated 08/05/23 If we don't have the samples in stock, he can call the insurance and ask for a lost med override. They'll usually do one per year.     LMOM

## 2023-07-15 NOTE — Telephone Encounter (Signed)
 Pt aware via wife  Medication Samples have been provided to the patient.  Drug name: jardiance        Strength: 10 mg        Qty: 14  LOT: [id number]  Exp.Date: 05/2025  Dosing instructions: ONE TAB DAILY   The patient has been instructed regarding the correct time, dose, and frequency of taking this medication, including desired effects and most common side effects.   [name] M 2:58 PM 07/15/2023

## 2023-07-18 ENCOUNTER — Telehealth (HOSPITAL_COMMUNITY): Payer: Self-pay | Admitting: Adult Health

## 2023-07-18 NOTE — Telephone Encounter (Signed)
  Cardiomems Remote Monitoring  S/P Cardiomems Implant   PAD Goal: 21 Most recent reading: 31- Suggestive of fluid accumulation   Recommended changes: Take an extra 20 mg torsemide x 3 days.   I continue to review and analyze the patients PA pressures weekly (and more often as needed) to bring PA pressures within the optimal range.    Bettyjane Shenoy NP-C 2:23 PM

## 2023-07-23 NOTE — Telephone Encounter (Signed)
Pt aware via wife 

## 2023-08-28 ENCOUNTER — Encounter: Payer: Self-pay | Admitting: Podiatry

## 2023-08-28 ENCOUNTER — Ambulatory Visit (INDEPENDENT_AMBULATORY_CARE_PROVIDER_SITE_OTHER): Payer: Medicare HMO | Admitting: Podiatry

## 2023-08-28 DIAGNOSIS — M79674 Pain in right toe(s): Secondary | ICD-10-CM | POA: Diagnosis not present

## 2023-08-28 DIAGNOSIS — E119 Type 2 diabetes mellitus without complications: Secondary | ICD-10-CM | POA: Diagnosis not present

## 2023-08-28 DIAGNOSIS — B351 Tinea unguium: Secondary | ICD-10-CM | POA: Diagnosis not present

## 2023-08-28 DIAGNOSIS — M79675 Pain in left toe(s): Secondary | ICD-10-CM | POA: Diagnosis not present

## 2023-08-28 NOTE — Progress Notes (Signed)
 This patient returns to my office for at risk foot care.  This patient requires this care by a professional since this patient will be at risk due to having diabetes type 2 and chronic venous insufficiency.  This patient is unable to cut nails himself since the patient cannot reach his nails.These nails are painful walking and wearing shoes.  This patient presents for at risk foot care today.  General Appearance  Alert, conversant and in no acute stress.  Vascular  Dorsalis pedis and posterior tibial  pulses are not  palpable  bilaterally.  Capillary return is within normal limits  bilaterally. Temperature is within normal limits  bilaterally.  Neurologic  Senn-Weinstein monofilament wire test diminished/absent  bilaterally. Muscle power within normal limits bilaterally.  Nails Thick disfigured discolored nails with subungual debris  from hallux to fifth toes bilaterally. No evidence of bacterial infection or drainage bilaterally.  Orthopedic  No limitations of motion  feet .  No crepitus or effusions noted.  No bony pathology or digital deformities noted.  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  Midfoot  DJD  B/L.  Limited rearfoot  ROM  B/L.  Skin  normotropic skin with no porokeratosis noted bilaterally.  No signs of infections or ulcers noted.     Onychomycosis  Pain in right toes  Pain in left toes  Consent was obtained for treatment procedures.   Mechanical debridement of nails 1-5  bilaterally performed with a nail nipper.  Filed with dremel without incident.    Return office visit    3 months                  Told patient to return for periodic foot care and evaluation due to potential at risk complications.   Helane Gunther DPM

## 2023-09-04 ENCOUNTER — Telehealth (HOSPITAL_COMMUNITY): Payer: Self-pay | Admitting: Adult Health

## 2023-09-04 MED ORDER — TORSEMIDE 20 MG PO TABS: 100.0000 mg | ORAL_TABLET | Freq: Two times a day (BID) | ORAL | 3 refills | Status: DC

## 2023-09-04 NOTE — Addendum Note (Signed)
 Addended by: Alvia Tory, Milagros Reap on: 09/04/2023 01:23 PM   Modules accepted: Orders

## 2023-09-04 NOTE — Telephone Encounter (Signed)
 Pt aware via wife  Wife reports they are unable to schedule labs x 1 week Would like to repeat at follow up 3/17

## 2023-09-04 NOTE — Telephone Encounter (Signed)
  Cardiomems Remote Monitoring  S/P Cardiomems Implant   PAD Goal: 21 Most recent reading: 26  Recommended changes: Please call reading suggestive of fluid accumulation. Increase torsemide to 100 mg twice a day. Check BMET in 7 days.   I continue to review and analyze the patients PA pressures weekly (and more often as needed) to bring PA pressures within the optimal range.    [name] Np_C  7:50 AM

## 2023-09-16 ENCOUNTER — Telehealth (HOSPITAL_COMMUNITY): Payer: Self-pay

## 2023-09-16 NOTE — Telephone Encounter (Signed)
 Called and spoke to pt's wife Kendal Hymen to confirm/remind patient of their appointment at the [facility] on 09/17/23.   Patient reminded to bring all medications and/or complete list.  Confirmed patient has transportation. Gave directions, instructed to utilize valet parking.  Confirmed appointment prior to ending call.

## 2023-09-17 ENCOUNTER — Encounter (HOSPITAL_COMMUNITY): Payer: Self-pay

## 2023-09-17 ENCOUNTER — Ambulatory Visit (HOSPITAL_COMMUNITY)
Admission: RE | Admit: 2023-09-17 | Discharge: 2023-09-17 | Disposition: A | Discharge status: Home / Self Care | Payer: Medicare HMO | Source: Ambulatory Visit | Attending: Family Medicine | Admitting: Family Medicine

## 2023-09-17 VITALS — BP 126/64 | HR 61 | Wt 209.6 lb

## 2023-09-17 DIAGNOSIS — I130 Hypertensive heart and chronic kidney disease with heart failure and stage 1 through stage 4 chronic kidney disease, or unspecified chronic kidney disease: Secondary | ICD-10-CM | POA: Diagnosis not present

## 2023-09-17 DIAGNOSIS — E118 Type 2 diabetes mellitus with unspecified complications: Secondary | ICD-10-CM

## 2023-09-17 DIAGNOSIS — E1122 Type 2 diabetes mellitus with diabetic chronic kidney disease: Secondary | ICD-10-CM | POA: Diagnosis not present

## 2023-09-17 DIAGNOSIS — E785 Hyperlipidemia, unspecified: Secondary | ICD-10-CM | POA: Diagnosis not present

## 2023-09-17 DIAGNOSIS — Z8674 Personal history of sudden cardiac arrest: Secondary | ICD-10-CM | POA: Diagnosis not present

## 2023-09-17 DIAGNOSIS — Z79899 Other long term (current) drug therapy: Secondary | ICD-10-CM | POA: Insufficient documentation

## 2023-09-17 DIAGNOSIS — I5022 Chronic systolic (congestive) heart failure: Secondary | ICD-10-CM | POA: Diagnosis not present

## 2023-09-17 DIAGNOSIS — L97921 Non-pressure chronic ulcer of unspecified part of left lower leg limited to breakdown of skin: Secondary | ICD-10-CM

## 2023-09-17 DIAGNOSIS — I7121 Aneurysm of the ascending aorta, without rupture: Secondary | ICD-10-CM | POA: Diagnosis not present

## 2023-09-17 DIAGNOSIS — N184 Chronic kidney disease, stage 4 (severe): Secondary | ICD-10-CM | POA: Insufficient documentation

## 2023-09-17 DIAGNOSIS — Z7984 Long term (current) use of oral hypoglycemic drugs: Secondary | ICD-10-CM | POA: Diagnosis not present

## 2023-09-17 DIAGNOSIS — G4733 Obstructive sleep apnea (adult) (pediatric): Secondary | ICD-10-CM | POA: Diagnosis not present

## 2023-09-17 DIAGNOSIS — I428 Other cardiomyopathies: Secondary | ICD-10-CM | POA: Diagnosis not present

## 2023-09-17 DIAGNOSIS — I5032 Chronic diastolic (congestive) heart failure: Secondary | ICD-10-CM | POA: Diagnosis not present

## 2023-09-17 LAB — BASIC METABOLIC PANEL
Anion gap: 10 (ref 5–15)
BUN: 36 mg/dL — ABNORMAL HIGH (ref 8–23)
CO2: 27 mmol/L (ref 22–32)
Calcium: 8.2 mg/dL — ABNORMAL LOW (ref 8.9–10.3)
Chloride: 101 mmol/L (ref 98–111)
Creatinine, Ser: 1.78 mg/dL — ABNORMAL HIGH (ref 0.61–1.24)
GFR, Estimated: 40 mL/min — ABNORMAL LOW (ref >60)
Glucose, Bld: 176 mg/dL — ABNORMAL HIGH (ref 70–99)
Potassium: 4.3 mmol/L (ref 3.5–5.1)
Sodium: 138 mmol/L (ref 135–145)

## 2023-09-17 LAB — BRAIN NATRIURETIC PEPTIDE: B Natriuretic Peptide: 248.4 pg/mL — ABNORMAL HIGH (ref 0.0–100.0)

## 2023-09-17 MED ORDER — TORSEMIDE 100 MG PO TABS: 100.0000 mg | ORAL_TABLET | Freq: Two times a day (BID) | ORAL | 3 refills | Status: AC

## 2023-09-17 MED ORDER — METOLAZONE 2.5 MG PO TABS: 2.5000 mg | ORAL_TABLET | ORAL | 3 refills | Status: DC

## 2023-09-17 NOTE — Progress Notes (Signed)
 ReDS Vest / Clip - 09/17/23 1400       ReDS Vest / Clip   Station Marker D    Ruler Value 34    ReDS Value Range Moderate volume overload    ReDS Actual Value 38

## 2023-09-17 NOTE — Progress Notes (Signed)
 [facility] Note  PCP: Loura Back, NP HF Cardiology: Dr. Shirlee Latch  Reason for Visit: Follow up for Systolic Heart Failure   73 y.o. with history of CHF, [name], [name] for followup of CHF. He has a history of mild LV systolic dysfunction (EF 45-50% range) with probably significant RV failure. Patient was admitted in 4/20 with hypoxic respiratory failure due to PNA and decompensated HF. Hospitalization was complicated by PEA arrest and AKI.   In 6/20, he was again admitted with hypoxemic respiratory failure with hypertensive crisis, CHF, and PNA.    Due to frequent hospital admission for CHF, underwent Cardiomems implantation 11/20.  RHC at that time showed optimized filling pressures after adjustment of torsemide. Outpatient sleep study found to have severe OSA and is now using BiPAP. He was also referred to HF paramedicine program.   Echo 6/21 showed EF 45-50%, grade I DD, RV normal, mild to moderate TR.   Last seen 01/2020. Discharged from paramedicine 9/21 after demonstrating competency managing his medications. Unfortunately lost to follow up but has been using Cardiomems regularly.  Follow up 10/23, we had not seen him since 01/2020. NYHA II-early III symptoms and volume overloaded. Torsemide increased to 80 bid and metolazone 2.5 mg added weekly.  Echo 3/24 showed EF 35-40% with paradoxical septal motion, consistent with LBBB, grade I DD, normal RV, mild to moderate AI, AoRoot dilation 41 mm, ascending aorta 45 mm.   cMRI 7/24 showed mild LV dilation with EF 38%, septal-lateral dyssynchrony, RV EF 47%, nonspecific inferior RV insertion site LGE, moderate MR.   Had some issues with retaining fluid prior to it, but last visit was doing well. Taking torsemide BID and metolazone once per week. Had been referred to nephrology for uptrending creatinine.  Today he returns for HF follow up with his wife. Overall feeling fine. He has benopnea and SOB walking  up steps, but otherwise no dyspnea with ADLs or walking on flat ground. He has chronic BLE edema. Feels more unsteady on his feet, no falls. Denies, palpitations, abnormal bleeding, CP, or PND/Orthopnea. Appetite ok. No fever or chills. Not weight at home. Taking all medications but stopped his weekly metolazone. Using Cardiomems.  Wearing BiPap.  Cardiomems (personally reviewed): Today's reading 30, goal is 21   ReDs reading: 38%, abnormal  Labs (5/24): K 4.5, creatinine 1.99 Labs (10/24): K 4.1, creatitine 2.51, TG 249, LDL 78 Labs (12/24): K 3.7, creatinine 2.26, LDL 70  PMH: 1. HTN 2. [name] stage 3 3. Type 2 diabetes 4. Hyperlipidemia 5. COPD: Suspected 6. PNA: Recurrent, suspected aspiration.  7. PEA arrest 4/20: Respiratory arrest.  8. LBBB 9. TIA 10. Ascending aortic aneurysm: CT chest in 6/20 with 5.1 cm ascending aortic aneurysm. - CT chest w/o contrast (12/23): 4.9 cm ascending aorta.  11. 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  - Echo (12/19): EF 45-50%.  - Echo (4/20): EF 45-50%, diffuse hypokinesis, normal RV.  - Cardiomems placement (11/20). - RHC (11/20): mean RA 2, PA 33/8, mean PCWP 11, CI 3.32 - Echo (6/21): EF 45-50%, grade I DD, RV normal, mild to moderate TR - Echo (3/24): EF 35-40%, evidence of LBBB - cMRI (7/24): showed mild LV dilation with EF 38%, septal-lateral dyssynchrony, RV EF 47%, nonspecific inferior RV insertion site LGE, moderate MR.  12. Ventricular ectopy: 8/20 monitor showed PVCs and NSVT.  13. Peripheral arterial dopplers (10/20): No significant stenosis.   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 OSA: Bipap  Social History   Socioeconomic History   Marital status: Married    Spouse name: Not on file   Number of children: Not on file   Years of education: Not on file   Highest education level: Not on file  Occupational History   Not on file  Tobacco Use   Smoking status: Never   Smokeless  tobacco: Never  Vaping Use   Vaping status: Never Used  Substance and Sexual Activity   Alcohol use: No   Drug use: No   Sexual activity: Not Currently    Partners: Female    Birth control/protection: None  Other Topics Concern   Not on file  Social History Narrative   Patient lives in private residence with supportive spouse   Social Drivers of Health   Financial Resource Strain: Low Risk  (10/15/2017)   Overall Financial Resource Strain (CARDIA)    Difficulty of Paying Living Expenses: Not very hard  Food Insecurity: No Food Insecurity (10/15/2017)   Hunger Vital Sign    Worried About Running Out of Food in the Last Year: Never true    Ran Out of Food in the Last Year: Never true  Transportation Needs: Unknown (10/15/2017)   PRAPARE - Transportation    Lack of Transportation (Medical): Patient declined    Lack of Transportation (Non-Medical): Patient declined  Physical Activity: Inactive (10/15/2017)   Exercise Vital Sign    Days of Exercise per Week: 0 days    Minutes of Exercise per Session: 0 min  Stress: No Stress Concern Present (10/15/2017)   [employer] of Occupational Health - Occupational Stress Questionnaire    Feeling of Stress : Not at all  Social Connections: Unknown (10/15/2017)   Social Connection and Isolation Panel [NHANES]    Frequency of Communication with Friends and Family: Patient declined    Frequency of Social Gatherings with Friends and Family: Patient declined    Attends Religious Services: Patient declined    Database administrator or Organizations: Patient declined    Attends Banker Meetings: Patient declined    Marital Status: Patient declined  Intimate Partner Violence: Not At Risk (10/15/2017)   Humiliation, Afraid, Rape, and Kick questionnaire    Fear of Current or Ex-Partner: No    Emotionally Abused: No    Physically Abused: No    Sexually Abused: No   Family History  Problem Relation Age of Onset   Emphysema Mother     Aneurysm Mother    Emphysema Father    Coronary artery disease Brother    Coronary artery disease Brother    Coronary artery disease Sister    ROS: All systems reviewed and negative except as per HPI.   Current Outpatient Medications  Medication Sig Dispense Refill   ASPIRIN 81 PO Take by mouth daily.     atorvastatin (LIPITOR) 40 MG tablet Take 1 tablet (40 mg total) by mouth every evening. 30 tablet 11   carvedilol (COREG) 12.5 MG tablet TAKE ONE TABLET BY MOUTH TWICE A DAY 180 tablet 3   finasteride (PROSCAR) 5 MG tablet Take 5 mg by mouth daily.     fluticasone (FLONASE) 50 MCG/ACT nasal spray Place 1 spray into both nostrils daily as needed for allergies or rhinitis.     gabapentin (NEURONTIN) 300 MG capsule Take 300 mg by mouth 2 (two) times daily.     glipiZIDE (GLUCOTROL) 5 MG tablet Take by mouth 2 (two) times daily  before a meal.     Iron Combinations (IRON COMPLEX PO) Take 1 tablet by mouth daily.     JARDIANCE 10 MG TABS tablet TAKE 1 TABLET BY MOUTH DAILY BEFORE BREAKFAST 90 tablet 2   KLOR-CON M20 20 MEQ tablet TAKE 2 TABLETS BY MOUTH DAILY 60 tablet 6   Loratadine (ALLERGY RELIEF 24-HR PO) Take 1 tablet by mouth daily.     montelukast (SINGULAIR) 10 MG tablet Take 10 mg by mouth daily.      Multiple Vitamin (MULTIVITAMIN) tablet Take 1 tablet by mouth daily.     pantoprazole (PROTONIX) 40 MG tablet Take 1 tablet (40 mg total) by mouth daily. 30 tablet 1   spironolactone (ALDACTONE) 25 MG tablet Take 0.5 tablets (12.5 mg total) by mouth daily. 45 tablet 3   SYMBICORT 80-4.5 MCG/ACT inhaler Inhale 2 puffs into the lungs daily as needed (wheezing).     tamsulosin (FLOMAX) 0.4 MG CAPS capsule Take 0.4 mg by mouth daily.     VENTOLIN HFA 108 (90 Base) MCG/ACT inhaler Inhale 2 puffs into the lungs every 6 (six) hours as needed for wheezing or shortness of breath.      metolazone (ZAROXOLYN) 2.5 MG tablet Take 1 tablet (2.5 mg total) by mouth once a week. 15 tablet 3    torsemide (DEMADEX) 100 MG tablet Take 1 tablet (100 mg total) by mouth 2 (two) times daily. 180 tablet 3   No current facility-administered medications for this encounter.   BP 126/64   Pulse 61   Wt 95.1 kg   SpO2 97%   BMI 32.83 kg/m    Wt Readings from Last 3 Encounters:  09/17/23 95.1 kg  06/19/23 93.2 kg  06/06/23 95.8 kg   Physical Exam: General:  Disheveled appearing. Walked into clinic. HEENT: Normal Neck: Supple. JVP 8-10 Cor: Regular rate & rhythm. No rubs, gallops, murmurs.  Lungs: Clear bilaterally.  Abdomen: Soft, non-distended. Extremities: No cyanosis, clubbing, rash; 1+ bilateral LE edema, + venous stasis changes.  Neuro: Alert & oriented x 3. Moves all 4 extremities w/o difficulty. Affect pleasant   Assessment/Plan: 1. Chronic systolic CHF:  Nonischemic cardiomyopathy.  Echo 4/20 with EF 45-50%.  Cath in 9/12 showed nonobstructive coronary disease.  S/p Cardiomems implant 11/20. Echo (6/21) with EF 45-50%, grade I DD, RV normal, mild to moderate TR. Echo (3/24) showed EF 35-40% with paradoxical septal motion. Cardiac MRI in 7/24 showed mild LV dilation with EF 38%, septal-lateral dyssynchrony, RV EF 47%, nonspecific inferior RV insertion site LGE (not suggestive of coronary disease), moderate MR. Mod AI. NYHA class II-early III symptoms, likely confounded by deconditioning. He is mildly volume overloaded by exam, ReDs elevated at 38% and Cardiomems readings elevated.  - Increase Torsemide to 100 mg bid. BMET/BNP today, repeat BMET in 7-10 days. - Restart metolazone 2.5 mg/40 KCL weekly. - Continue Coreg 12.5 mg bid.  - Continue Jardiance 10 mg daily.  - Continue spiro 12.5 daily. - Continue to follow Cardiomems.  - Repeat echo. If EF < 35%, refer to EP for CRT consideration. Last ECG showed QRS 154  msec 2. [name]: Stage IV. BMET today.  - Referral to nephrology. 3. OSA: On Bipap. Continue nightly.  4. Lower extremity ulcerations: Possibly venous stasis. Followed  by podiatry. 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  5. Ascending aortic aneurysm: CT chest 6/20 with 5.1 cm ascending aortic aneurysm.  CT chest w/o contrast 12/23 with 4.9 cm ascending aorta.  - Repeat CT chest w/o  contrast ordered but has not been completed. Will reach out to scheduler to arrange this. 6. Hyperlipidemia: Goal LDL < 70 with diabetes.  - LDL 70, HDL 38, triglycerides 175 13/2/44. - Continue atorvastatin. 7. Diabetes Type 2: A1c 8.5 (10/24).  - Management per PCP.  Follow up in 3 months with Dr. Shirlee Latch + echo.  Prince Rome, FNP-BC 09/17/23

## 2023-09-17 NOTE — Progress Notes (Deleted)
 ReDS Vest / Clip - 09/17/23 1400       ReDS Vest / Clip   Station Marker D    Ruler Value 34    ReDS Value Range Moderate volume overload    ReDS Actual Value 34

## 2023-09-17 NOTE — Patient Instructions (Addendum)
 INCREASE Torsemide 100 mg Twice daily  TAKE Metolazone 2.5 mg every Wednesday with 40 mEq ( 2 Tabs) Potassium  Labs done today, your results will be available in MyChart, we will contact you for abnormal readings.  Repeat blood work in 10 days   Your physician has requested that you have an echocardiogram. Echocardiography is a painless test that uses sound waves to create images of your heart. It provides your doctor with information about the size and shape of your heart and how well your heart's chambers and valves are working. This procedure takes approximately one hour. There are no restrictions for this procedure. Please do NOT wear cologne, perfume, aftershave, or lotions (deodorant is allowed). Please arrive 15 minutes prior to your appointment time.  Please note: We ask at that you not bring children with you during ultrasound (echo/ vascular) testing. Due to room size and safety concerns, children are not allowed in the ultrasound rooms during exams. Our front office staff cannot provide observation of children in our lobby area while testing is being conducted. An adult accompanying a patient to their appointment will only be allowed in the ultrasound room at the discretion of the ultrasound technician under special circumstances. We apologize for any inconvenience.  You have been referred to Nephology. You will be called to have this appointment arranged.  Your physician recommends that you schedule a follow-up appointment in: 3 months.  If you have any questions or concerns before your next appointment please send [place] a message through [place] or call our office at [phone].    TO LEAVE A MESSAGE FOR THE NURSE SELECT OPTION 2, PLEASE LEAVE A MESSAGE INCLUDING: YOUR NAME DATE OF BIRTH CALL BACK NUMBER REASON FOR CALL**this is important as we prioritize the call backs  YOU WILL RECEIVE A CALL BACK THE SAME DAY AS LONG AS YOU CALL BEFORE 4:00 PM  At the [facility], you and your health needs are our priority. As part of our continuing mission to provide you with exceptional heart care, we have created designated Provider Care Teams. These Care Teams include your primary Cardiologist (physician) and Advanced Practice Providers (APPs- Physician Assistants and Nurse Practitioners) who all work together to provide you with the care you need, when you need it.   You may see any of the following providers on your designated Care Team at your next follow up: Dr Arvilla Meres Dr Marca Ancona Dr. Dorthula Nettles Dr. Clearnce Hasten Amy Filbert Schilder, NP Robbie Lis, [place] [facility] [place], [place] Brynda Peon, NP [place] Lee, NP Clarisa Kindred, NP Karle Plumber, PharmD Enos Fling, PharmD   Please be sure to bring in all your medications bottles to every appointment.    Thank you for choosing [facility]

## 2023-09-27 ENCOUNTER — Ambulatory Visit (HOSPITAL_COMMUNITY)
Admission: RE | Admit: 2023-09-27 | Discharge: 2023-09-27 | Disposition: A | Discharge status: Home / Self Care | Source: Ambulatory Visit | Attending: Cardiology | Admitting: Cardiology

## 2023-09-27 DIAGNOSIS — N184 Chronic kidney disease, stage 4 (severe): Secondary | ICD-10-CM | POA: Diagnosis present

## 2023-09-27 LAB — BASIC METABOLIC PANEL WITH GFR
Anion gap: 15 (ref 5–15)
BUN: 54 mg/dL — ABNORMAL HIGH (ref 8–23)
CO2: 31 mmol/L (ref 22–32)
Calcium: 8.8 mg/dL — ABNORMAL LOW (ref 8.9–10.3)
Chloride: 90 mmol/L — ABNORMAL LOW (ref 98–111)
Creatinine, Ser: 2.61 mg/dL — ABNORMAL HIGH (ref 0.61–1.24)
GFR, Estimated: 25 mL/min — ABNORMAL LOW (ref >60)
Glucose, Bld: 188 mg/dL — ABNORMAL HIGH (ref 70–99)
Potassium: 4 mmol/L (ref 3.5–5.1)
Sodium: 136 mmol/L (ref 135–145)

## 2023-10-14 ENCOUNTER — Telehealth (HOSPITAL_COMMUNITY): Payer: Self-pay

## 2023-10-14 ENCOUNTER — Telehealth (HOSPITAL_COMMUNITY): Payer: Self-pay | Admitting: Adult Health

## 2023-10-14 DIAGNOSIS — I5022 Chronic systolic (congestive) heart failure: Secondary | ICD-10-CM

## 2023-10-14 NOTE — Telephone Encounter (Signed)
 Patient's labs has been placed and appointment scheduled. Spoke to pt's wife Tanya Fantasia and she's aware, agreeable, and verbalized understanding.

## 2023-10-14 NOTE — Telephone Encounter (Signed)
Attempted to call patient, left message for patient to call back to office.   

## 2023-10-14 NOTE — Telephone Encounter (Signed)
  Cardiomems Remote Monitoring  S/P Cardiomems Implant   PAD Goal: 21 Most recent reading: 28 suggestive of fluid accumulation   Recommended changes: Please call. Instruct take metoolazone today with extra 20 meq of potassium. Needs follow up with APP. Needs BMET   I continue to review and analyze the patients PA pressures weekly (and more often as needed) to bring PA pressures within the optimal range.     Sora Olivo NP-C 1:45 PM

## 2023-10-14 NOTE — Telephone Encounter (Signed)
-----   Message from Arlice Bene Lane sent at 09/27/2023  4:16 PM EDT ----- Renal function up a bit, repeat BMET in 10-14 days to follow

## 2023-10-15 NOTE — Telephone Encounter (Signed)
 No answer, Left message to return call.

## 2023-10-16 NOTE — Telephone Encounter (Signed)
Pt aware via wife 

## 2023-10-21 NOTE — H&P (View-Only) (Signed)
 [facility] Failure Clinic Note  PCP: Hershell Lose, NP HF Cardiology: Dr. Mitzie Anda  Reason for Visit: Follow up for Systolic Heart Failure   73 y.o. with history of CHF, [name], [name] for followup of CHF. He has a history of mild LV systolic dysfunction (EF 45-50% range) with probably significant RV failure. Patient was admitted in 4/20 with hypoxic respiratory failure due to PNA and decompensated HF. Hospitalization was complicated by PEA arrest and AKI.   In 6/20, he was again admitted with hypoxemic respiratory failure with hypertensive crisis, CHF, and PNA.    Due to frequent hospital admission for CHF, underwent Cardiomems implantation 11/20.  RHC at that time showed optimized filling pressures after adjustment of torsemide . Outpatient sleep study found to have severe OSA and is now using BiPAP. He was also referred to HF paramedicine program.   Echo 6/21 showed EF 45-50%, grade I DD, RV normal, mild to moderate TR.   Last seen 01/2020. Discharged from paramedicine 9/21 after demonstrating competency managing his medications. Unfortunately lost to follow up but has been using Cardiomems regularly.  Follow up 10/23, we had not seen him since 01/2020. NYHA II-early III symptoms and volume overloaded. Torsemide  increased to 80 bid and metolazone  2.5 mg added weekly.  Echo 3/24 showed EF 35-40% with paradoxical septal motion, consistent with LBBB, grade I DD, normal RV, mild to moderate AI, AoRoot dilation 41 mm, ascending aorta 45 mm.   cMRI 7/24 showed mild LV dilation with EF 38%, septal-lateral dyssynchrony, RV EF 47%, nonspecific inferior RV insertion site LGE, moderate MR.   Had some issues with retaining fluid prior to it, but last visit was doing well. Taking torsemide  BID and metolazone  once per week. Had been referred to nephrology for uptrending creatinine.  Today he returns for AHF follow up after elevated Cardiomems reading and diuretic adjustment with his  wife. Overall feeling ok. Denies palpitations, CP, dizziness, or PND/Orthopnea. Has some BLE. Denies SOB, only SOB with bending over to pick up things. Appetite great. No fever or chills. Does not weight at home. Taking all medications. Denies ETOH, tobacco or drug use. Eats a lot of canned tomato soup and hamburgers. Unable to tell me how much fluid he drinks but says he's constantly drinking things even at night. Able to lay relatively flat at night, sleeps on 2 pillows.   Cardiomems (personally reviewed): Today's reading 28, goal is 21   ReDs reading: 38%, abnormal   PMH: 1. HTN 2. [name] stage 3 3. Type 2 diabetes 4. Hyperlipidemia 5. COPD: Suspected 6. PNA: Recurrent, suspected aspiration.  7. PEA arrest 4/20: Respiratory arrest.  8. LBBB 9. TIA 10. Ascending aortic aneurysm: CT chest in 6/20 with 5.1 cm ascending aortic aneurysm. - CT chest w/o contrast (12/23): 4.9 cm ascending aorta.  11. 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  - Echo (12/19): EF 45-50%.  - Echo (4/20): EF 45-50%, diffuse hypokinesis, normal RV.  - Cardiomems placement (11/20). - RHC (11/20): mean RA 2, PA 33/8, mean PCWP 11, CI 3.32 - Echo (6/21): EF 45-50%, grade I DD, RV normal, mild to moderate TR - Echo (3/24): EF 35-40%, evidence of LBBB - cMRI (7/24): showed mild LV dilation with EF 38%, septal-lateral dyssynchrony, RV EF 47%, nonspecific inferior RV insertion site LGE, moderate MR.  12. Ventricular ectopy: 8/20 monitor showed PVCs and NSVT.  13. Peripheral arterial dopplers (10/20): No significant stenosis.   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 OSA: Bipap  Social History   Socioeconomic History   Marital status: Married    Spouse name: Not on file   Number of children: Not on file   Years of education: Not on file   Highest education level: Not on file  Occupational History   Not on file  Tobacco Use   Smoking status: Never   Smokeless tobacco: Never   Vaping Use   Vaping status: Never Used  Substance and Sexual Activity   Alcohol  use: No   Drug use: No   Sexual activity: Not Currently    Partners: Female    Birth control/protection: None  Other Topics Concern   Not on file  Social History Narrative   Patient lives in private residence with supportive spouse   Social Drivers of Health   Financial Resource Strain: Low Risk  (10/15/2017)   Overall Financial Resource Strain (CARDIA)    Difficulty of Paying Living Expenses: Not very hard  Food Insecurity: No Food Insecurity (10/15/2017)   Hunger Vital Sign    Worried About Running Out of Food in the Last Year: Never true    Ran Out of Food in the Last Year: Never true  Transportation Needs: Unknown (10/15/2017)   PRAPARE - Transportation    Lack of Transportation (Medical): Patient declined    Lack of Transportation (Non-Medical): Patient declined  Physical Activity: Inactive (10/15/2017)   Exercise Vital Sign    Days of Exercise per Week: 0 days    Minutes of Exercise per Session: 0 min  Stress: No Stress Concern Present (10/15/2017)   [employer] of Occupational Health - Occupational Stress Questionnaire    Feeling of Stress : Not at all  Social Connections: Unknown (10/15/2017)   Social Connection and Isolation Panel [NHANES]    Frequency of Communication with Friends and Family: Patient declined    Frequency of Social Gatherings with Friends and Family: Patient declined    Attends Religious Services: Patient declined    Database administrator or Organizations: Patient declined    Attends Banker Meetings: Patient declined    Marital Status: Patient declined  Intimate Partner Violence: Not At Risk (10/15/2017)   Humiliation, Afraid, Rape, and Kick questionnaire    Fear of Current or Ex-Partner: No    Emotionally Abused: No    Physically Abused: No    Sexually Abused: No   Family History  Problem Relation Age of Onset   Emphysema Mother    Aneurysm  Mother    Emphysema Father    Coronary artery disease Brother    Coronary artery disease Brother    Coronary artery disease Sister    ROS: All systems reviewed and negative except as per HPI.   Current Outpatient Medications  Medication Sig Dispense Refill   ASPIRIN  81 PO Take by mouth daily.     atorvastatin  (LIPITOR) 40 MG tablet Take 1 tablet (40 mg total) by mouth every evening. 30 tablet 11   carvedilol  (COREG ) 12.5 MG tablet TAKE ONE TABLET BY MOUTH TWICE A DAY 180 tablet 3   finasteride  (PROSCAR ) 5 MG tablet Take 5 mg by mouth daily.     gabapentin  (NEURONTIN ) 300 MG capsule Take 300 mg by mouth 2 (two) times daily.     glipiZIDE  (GLUCOTROL ) 5 MG tablet Take by mouth 2 (two) times daily before a meal.     Iron Combinations (IRON COMPLEX PO) Take 1 tablet by mouth daily.     JARDIANCE   10 MG TABS tablet TAKE 1 TABLET BY MOUTH DAILY BEFORE BREAKFAST 90 tablet 2   KLOR-CON  M20 20 MEQ tablet TAKE 2 TABLETS BY MOUTH DAILY 60 tablet 6   Loratadine (ALLERGY RELIEF 24-HR PO) Take 1 tablet by mouth daily.     montelukast  (SINGULAIR ) 10 MG tablet Take 10 mg by mouth daily.      Multiple Vitamin (MULTIVITAMIN) tablet Take 1 tablet by mouth daily.     pantoprazole  (PROTONIX ) 40 MG tablet Take 1 tablet (40 mg total) by mouth daily. 30 tablet 1   spironolactone  (ALDACTONE ) 25 MG tablet Take 0.5 tablets (12.5 mg total) by mouth daily. 45 tablet 3   SYMBICORT 80-4.5 MCG/ACT inhaler Inhale 2 puffs into the lungs daily as needed (wheezing).     tamsulosin  (FLOMAX ) 0.4 MG CAPS capsule Take 0.4 mg by mouth daily.     torsemide  (DEMADEX ) 100 MG tablet Take 1 tablet (100 mg total) by mouth 2 (two) times daily. 180 tablet 3   VENTOLIN  HFA 108 (90 Base) MCG/ACT inhaler Inhale 2 puffs into the lungs every 6 (six) hours as needed for wheezing or shortness of breath.      fluticasone  (FLONASE) 50 MCG/ACT nasal spray Place 1 spray into both nostrils daily as needed for allergies or rhinitis.     No current  facility-administered medications for this encounter.   BP 112/70   Pulse 68   Ht 5\' 8"  (1.727 m)   Wt 91.9 kg (202 lb 9.6 oz)   SpO2 93%   BMI 30.81 kg/m    Wt Readings from Last 3 Encounters:  10/30/23 91.9 kg (202 lb 9.6 oz)  09/17/23 95.1 kg (209 lb 9.6 oz)  06/19/23 93.2 kg (205 lb 6.4 oz)   Physical Exam: General:  frail / disheveled appearing.  No respiratory difficulty Neck: supple. JVD difficult to see, appears to be ~9/10.  Cor: PMI nondisplaced. Regular rate & rhythm. No rubs, gallops or murmurs. Lungs: coarse throughout Extremities: no cyanosis, clubbing, rash, +1 BLE edema  Neuro: alert & oriented x 3. Moves all 4 extremities w/o difficulty. Affect pleasant.    Assessment/Plan: 1. Acute on chronic systolic CHF:  Nonischemic cardiomyopathy.  Echo 4/20 with EF 45-50%.  Cath in 9/12 showed nonobstructive coronary disease.  S/p Cardiomems implant 11/20. Echo (6/21) with EF 45-50%, grade I DD, RV normal, mild to moderate TR. Echo (3/24) showed EF 35-40% with paradoxical septal motion. Cardiac MRI in 7/24 showed mild LV dilation with EF 38%, septal-lateral dyssynchrony, RV EF 47%, nonspecific inferior RV insertion site LGE (not suggestive of coronary disease), moderate MR. Mod AI.  - NYHA class II-early III symptoms, likely confounded by deconditioning.  - He is mildly volume overloaded by exam, ReDs elevated at 38% and Cardiomems readings elevated. Weight however down?  - Continue Torsemide  to 100 mg bid. BMET/BNP today. Will not adjust diuretics with worsening renal function. Recently BUN almost doubled and metolazone  was stopped. Will send expedited referral to nephrology to try to see him soon.  - Previously on metolazone  2.5 mg/40 KCL weekly, recently stopped with AKI. - Continue Coreg  12.5 mg bid.  - Continue Jardiance  10 mg daily.  - Continue spiro 12.5 daily. - Continue to follow Cardiomems.  - Repeat echo. If EF < 35%, refer to EP for CRT consideration. Last ECG  showed QRS 154  msec - Discussed cutting back on canned soups and fluid intake.  - Plan discussed with Dr. Mitzie Anda. Will arrange RHC with him for better  optimization. Hard to gauge fluid despite two indicators showing high volume. Concerned CO/CI reduced from 2020. Will scheduled RHC. Wife is very concerned about the cost as her grandchildren just used up her credit cards, encouraged to call insurance for exact quote. Discussed with her that this would be very helpful in guiding treatment for her husband.  Informed Consent   Shared Decision Making/Informed Consent The risks [stroke (1 in 1000), death (1 in 1000), kidney failure [usually temporary] (1 in 500), bleeding (1 in 200), allergic reaction [possibly serious] (1 in 200)], benefits (diagnostic support and management of coronary artery disease) and alternatives of a cardiac catheterization were discussed in detail with Mr. Franchini and he is willing to proceed.     2. [name]: Stage IV. BMET today.  - SCr last up to 3.09, metolazone  stopped - Referral to nephrology as above.  - Worried with worsening renal function and BUN almost doubled.   3. OSA: On Bipap. Continue nightly.   4. Lower extremity ulcerations: Possibly venous stasis. Followed by podiatry. 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   5. Ascending aortic aneurysm: CT chest 6/20 with 5.1 cm ascending aortic aneurysm.  CT chest w/o contrast 12/23 with 4.9 cm ascending aorta.  - Repeat CT chest w/o contrast ordered but has not been completed.   6. Hyperlipidemia: Goal LDL < 70 with diabetes.  - LDL 70, HDL 38, triglycerides 175 98/1/19. - Continue atorvastatin .  7. Diabetes Type 2: A1c 8.5 (10/24).  - Management per PCP.  Will arrange 3 week f/u post cath but will keep scheduled f/u in 2 months with Dr. Mitzie Anda + echo.   Sheryl Donna AGACNP-BC  10/30/23

## 2023-10-21 NOTE — Progress Notes (Incomplete)
 Advanced Heart Failure Clinic Note  PCP: Hershell Lose, NP HF Cardiology: Dr. Mitzie Anda  Reason for Visit: Follow up for Systolic Heart Failure   73 y.o. with history of CHF, [name], [name] for followup of CHF. He has a history of mild LV systolic dysfunction (EF 45-50% range) with probably significant RV failure. Patient was admitted in 4/20 with hypoxic respiratory failure due to PNA and decompensated HF. Hospitalization was complicated by PEA arrest and AKI.   In 6/20, he was again admitted with hypoxemic respiratory failure with hypertensive crisis, CHF, and PNA.    Due to frequent hospital admission for CHF, underwent Cardiomems implantation 11/20.  RHC at that time showed optimized filling pressures after adjustment of torsemide . Outpatient sleep study found to have severe OSA and is now using BiPAP. He was also referred to HF paramedicine program.   Echo 6/21 showed EF 45-50%, grade I DD, RV normal, mild to moderate TR.   Last seen 01/2020. Discharged from paramedicine 9/21 after demonstrating competency managing his medications. Unfortunately lost to follow up but has been using Cardiomems regularly.  Follow up 10/23, we had not seen him since 01/2020. NYHA II-early III symptoms and volume overloaded. Torsemide  increased to 80 bid and metolazone  2.5 mg added weekly.  Echo 3/24 showed EF 35-40% with paradoxical septal motion, consistent with LBBB, grade I DD, normal RV, mild to moderate AI, AoRoot dilation 41 mm, ascending aorta 45 mm.   cMRI 7/24 showed mild LV dilation with EF 38%, septal-lateral dyssynchrony, RV EF 47%, nonspecific inferior RV insertion site LGE, moderate MR.   Had some issues with retaining fluid prior to it, but last visit was doing well. Taking torsemide  BID and metolazone  once per week. Had been referred to nephrology for uptrending creatinine.  Today he returns for AHF follow up after elevated Cardiomems reading and diuretic adjustment. Overall  feeling ***. Denies palpitations, CP, dizziness, edema, or PND/Orthopnea. *** SOB. Appetite ok. No fever or chills. Weight at home *** pounds. Taking all medications. Denies ETOH, tobacco or drug use.   Cardiomems (personally reviewed): Today's reading 30, goal is 21   ReDs reading: 38%, abnormal ***  PMH: 1. HTN 2. [name] stage 3 3. Type 2 diabetes 4. Hyperlipidemia 5. COPD: Suspected 6. PNA: Recurrent, suspected aspiration.  7. PEA arrest 4/20: Respiratory arrest.  8. LBBB 9. TIA 10. Ascending aortic aneurysm: CT chest in 6/20 with 5.1 cm ascending aortic aneurysm. - CT chest w/o contrast (12/23): 4.9 cm ascending aorta.  11. 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  - Echo (12/19): EF 45-50%.  - Echo (4/20): EF 45-50%, diffuse hypokinesis, normal RV.  - Cardiomems placement (11/20). - RHC (11/20): mean RA 2, PA 33/8, mean PCWP 11, CI 3.32 - Echo (6/21): EF 45-50%, grade I DD, RV normal, mild to moderate TR - Echo (3/24): EF 35-40%, evidence of LBBB - cMRI (7/24): showed mild LV dilation with EF 38%, septal-lateral dyssynchrony, RV EF 47%, nonspecific inferior RV insertion site LGE, moderate MR.  12. Ventricular ectopy: 8/20 monitor showed PVCs and NSVT.  13. Peripheral arterial dopplers (10/20): No significant stenosis.   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 OSA: Bipap  Social History   Socioeconomic History   Marital status: Married    Spouse name: Not on file   Number of children: Not on file   Years of education: Not on file   Highest education level: Not on file  Occupational  History   Not on file  Tobacco Use   Smoking status: Never   Smokeless tobacco: Never  Vaping Use   Vaping status: Never Used  Substance and Sexual Activity   Alcohol  use: No   Drug use: No   Sexual activity: Not Currently    Partners: Female    Birth control/protection: None  Other Topics Concern   Not on file  Social History Narrative    Patient lives in private residence with supportive spouse   Social Drivers of Health   Financial Resource Strain: Low Risk  (10/15/2017)   Overall Financial Resource Strain (CARDIA)    Difficulty of Paying Living Expenses: Not very hard  Food Insecurity: No Food Insecurity (10/15/2017)   Hunger Vital Sign    Worried About Running Out of Food in the Last Year: Never true    Ran Out of Food in the Last Year: Never true  Transportation Needs: Unknown (10/15/2017)   PRAPARE - Transportation    Lack of Transportation (Medical): Patient declined    Lack of Transportation (Non-Medical): Patient declined  Physical Activity: Inactive (10/15/2017)   Exercise Vital Sign    Days of Exercise per Week: 0 days    Minutes of Exercise per Session: 0 min  Stress: No Stress Concern Present (10/15/2017)   [employer] of Occupational Health - Occupational Stress Questionnaire    Feeling of Stress : Not at all  Social Connections: Unknown (10/15/2017)   Social Connection and Isolation Panel [NHANES]    Frequency of Communication with Friends and Family: Patient declined    Frequency of Social Gatherings with Friends and Family: Patient declined    Attends Religious Services: Patient declined    Database administrator or Organizations: Patient declined    Attends Banker Meetings: Patient declined    Marital Status: Patient declined  Intimate Partner Violence: Not At Risk (10/15/2017)   Humiliation, Afraid, Rape, and Kick questionnaire    Fear of Current or Ex-Partner: No    Emotionally Abused: No    Physically Abused: No    Sexually Abused: No   Family History  Problem Relation Age of Onset   Emphysema Mother    Aneurysm Mother    Emphysema Father    Coronary artery disease Brother    Coronary artery disease Brother    Coronary artery disease Sister    ROS: All systems reviewed and negative except as per HPI.   Current Outpatient Medications  Medication Sig Dispense Refill    ASPIRIN  81 PO Take by mouth daily.     atorvastatin  (LIPITOR) 40 MG tablet Take 1 tablet (40 mg total) by mouth every evening. 30 tablet 11   carvedilol  (COREG ) 12.5 MG tablet TAKE ONE TABLET BY MOUTH TWICE A DAY 180 tablet 3   finasteride  (PROSCAR ) 5 MG tablet Take 5 mg by mouth daily.     fluticasone  (FLONASE) 50 MCG/ACT nasal spray Place 1 spray into both nostrils daily as needed for allergies or rhinitis.     gabapentin  (NEURONTIN ) 300 MG capsule Take 300 mg by mouth 2 (two) times daily.     glipiZIDE  (GLUCOTROL ) 5 MG tablet Take by mouth 2 (two) times daily before a meal.     Iron Combinations (IRON COMPLEX PO) Take 1 tablet by mouth daily.     JARDIANCE  10 MG TABS tablet TAKE 1 TABLET BY MOUTH DAILY BEFORE BREAKFAST 90 tablet 2   KLOR-CON  M20 20 MEQ tablet TAKE 2 TABLETS BY MOUTH DAILY 60  tablet 6   Loratadine (ALLERGY RELIEF 24-HR PO) Take 1 tablet by mouth daily.     metolazone  (ZAROXOLYN ) 2.5 MG tablet Take 1 tablet (2.5 mg total) by mouth once a week. 15 tablet 3   montelukast  (SINGULAIR ) 10 MG tablet Take 10 mg by mouth daily.      Multiple Vitamin (MULTIVITAMIN) tablet Take 1 tablet by mouth daily.     pantoprazole  (PROTONIX ) 40 MG tablet Take 1 tablet (40 mg total) by mouth daily. 30 tablet 1   spironolactone  (ALDACTONE ) 25 MG tablet Take 0.5 tablets (12.5 mg total) by mouth daily. 45 tablet 3   SYMBICORT 80-4.5 MCG/ACT inhaler Inhale 2 puffs into the lungs daily as needed (wheezing).     tamsulosin  (FLOMAX ) 0.4 MG CAPS capsule Take 0.4 mg by mouth daily.     torsemide  (DEMADEX ) 100 MG tablet Take 1 tablet (100 mg total) by mouth 2 (two) times daily. 180 tablet 3   VENTOLIN  HFA 108 (90 Base) MCG/ACT inhaler Inhale 2 puffs into the lungs every 6 (six) hours as needed for wheezing or shortness of breath.      No current facility-administered medications for this visit.   There were no vitals taken for this visit.   Wt Readings from Last 3 Encounters:  09/17/23 95.1 kg (209 lb 9.6  oz)  06/19/23 93.2 kg (205 lb 6.4 oz)  06/06/23 95.8 kg (211 lb 3.2 oz)   Physical Exam: General:  *** appearing.  No respiratory difficulty HEENT: normal Neck: supple. JVD *** cm.  Cor: PMI nondisplaced. Regular rate & rhythm. No rubs, gallops or murmurs. Lungs: clear Abdomen: soft, nontender, nondistended. Good bowel sounds. Extremities: no cyanosis, clubbing, rash, edema  Neuro: alert & oriented x 3. Moves all 4 extremities w/o difficulty. Affect pleasant.    Assessment/Plan: 1. Chronic systolic CHF:  Nonischemic cardiomyopathy.  Echo 4/20 with EF 45-50%.  Cath in 9/12 showed nonobstructive coronary disease.  S/p Cardiomems implant 11/20. Echo (6/21) with EF 45-50%, grade I DD, RV normal, mild to moderate TR. Echo (3/24) showed EF 35-40% with paradoxical septal motion. Cardiac MRI in 7/24 showed mild LV dilation with EF 38%, septal-lateral dyssynchrony, RV EF 47%, nonspecific inferior RV insertion site LGE (not suggestive of coronary disease), moderate MR. Mod AI. NYHA class II-early III symptoms, likely confounded by deconditioning. He is mildly volume overloaded by exam, ReDs elevated at 38% and Cardiomems readings elevated. *** - Continue Torsemide  to 100 mg bid. BMET/BNP today, repeat BMET in 7-10 days. - Restart metolazone  2.5 mg/40 KCL weekly. - Continue Coreg  12.5 mg bid.  - Continue Jardiance  10 mg daily.  - Continue spiro 12.5 daily. - Continue to follow Cardiomems.  - Repeat echo. If EF < 35%, refer to EP for CRT consideration. Last ECG showed QRS 154  msec  2. [name]: Stage IV. BMET today.  - Referral to nephrology. ***  3. OSA: On Bipap. Continue nightly.   4. Lower extremity ulcerations: Possibly venous stasis. Followed by podiatry. 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   5. Ascending aortic aneurysm: CT chest 6/20 with 5.1 cm ascending aortic aneurysm.  CT chest w/o contrast 12/23 with 4.9 cm ascending aorta.  - Repeat CT chest w/o contrast ordered but has not been  completed. Will reach out to scheduler to arrange this.  6. Hyperlipidemia: Goal LDL < 70 with diabetes.  - LDL 70, HDL 38, triglycerides 175 96/2/95. - Continue atorvastatin .  7. Diabetes Type 2: A1c 8.5 (10/24).  - Management  per PCP.  Follow up in 3 months with Dr. Mitzie Anda + echo. *** (has echo scheduled)  Sheryl Donna AGACNP-BC  10/21/23

## 2023-10-23 ENCOUNTER — Ambulatory Visit (HOSPITAL_COMMUNITY)
Admission: RE | Admit: 2023-10-23 | Discharge: 2023-10-23 | Disposition: A | Discharge status: Home / Self Care | Source: Ambulatory Visit | Attending: Cardiology | Admitting: Cardiology

## 2023-10-23 DIAGNOSIS — I5022 Chronic systolic (congestive) heart failure: Secondary | ICD-10-CM | POA: Diagnosis present

## 2023-10-23 LAB — BASIC METABOLIC PANEL WITH GFR
Anion gap: 17 — ABNORMAL HIGH (ref 5–15)
BUN: 84 mg/dL — ABNORMAL HIGH (ref 8–23)
CO2: 28 mmol/L (ref 22–32)
Calcium: 8.4 mg/dL — ABNORMAL LOW (ref 8.9–10.3)
Chloride: 92 mmol/L — ABNORMAL LOW (ref 98–111)
Creatinine, Ser: 3.09 mg/dL — ABNORMAL HIGH (ref 0.61–1.24)
GFR, Estimated: 21 mL/min — ABNORMAL LOW (ref >60)
Glucose, Bld: 257 mg/dL — ABNORMAL HIGH (ref 70–99)
Potassium: 3.4 mmol/L — ABNORMAL LOW (ref 3.5–5.1)
Sodium: 137 mmol/L (ref 135–145)

## 2023-10-25 ENCOUNTER — Telehealth (HOSPITAL_COMMUNITY): Payer: Self-pay

## 2023-10-25 NOTE — Telephone Encounter (Signed)
-----   Message from Elmarie Hacking sent at 10/23/2023  4:52 PM EDT ----- Renal function elevated and K is low.  No further metolazone .  Hold torsemide  x 1 day, then resume at home dose of 100 mg bid. Repeat BMET in 1 week

## 2023-10-25 NOTE — Telephone Encounter (Signed)
 Spoke with patient's wife (okay per DPR) regarding the following results. Patient's wife made aware and patient's wife verbalized understanding. Medication list updated and Metolazone  discontinued.   Patient scheduled for follow up on 4/30 with APP here at HF clinic. Will add note to recheck BMP at that appointment.   Advised patients wife to call back with any issues, questions, or concerns and she verbalized understanding.

## 2023-10-29 ENCOUNTER — Telehealth (HOSPITAL_COMMUNITY): Payer: Self-pay

## 2023-10-29 NOTE — Telephone Encounter (Addendum)
 Called to confirm/remind patient of their appointment at the [facility] on 10/30/2023 1:30.   Appointment:   [x] Confirmed  [] Left mess   [] No answer/No voice mail  [] VM Full/unable to leave message  [] Phone not in service  Patient reminded to bring all medications and/or complete list.  Confirmed patient has transportation. Gave directions, instructed to utilize valet parking.

## 2023-10-30 ENCOUNTER — Ambulatory Visit (HOSPITAL_COMMUNITY)
Admission: RE | Admit: 2023-10-30 | Discharge: 2023-10-30 | Disposition: A | Discharge status: Home / Self Care | Source: Ambulatory Visit | Attending: Internal Medicine | Admitting: Internal Medicine

## 2023-10-30 ENCOUNTER — Encounter (HOSPITAL_COMMUNITY): Payer: Self-pay

## 2023-10-30 ENCOUNTER — Other Ambulatory Visit (HOSPITAL_COMMUNITY): Payer: Self-pay | Admitting: *Deleted

## 2023-10-30 VITALS — BP 112/70 | HR 68 | Ht 68.0 in | Wt 202.6 lb

## 2023-10-30 DIAGNOSIS — N184 Chronic kidney disease, stage 4 (severe): Secondary | ICD-10-CM | POA: Diagnosis not present

## 2023-10-30 DIAGNOSIS — Z8674 Personal history of sudden cardiac arrest: Secondary | ICD-10-CM | POA: Diagnosis not present

## 2023-10-30 DIAGNOSIS — Z8701 Personal history of pneumonia (recurrent): Secondary | ICD-10-CM | POA: Insufficient documentation

## 2023-10-30 DIAGNOSIS — G4733 Obstructive sleep apnea (adult) (pediatric): Secondary | ICD-10-CM | POA: Diagnosis not present

## 2023-10-30 DIAGNOSIS — I7121 Aneurysm of the ascending aorta, without rupture: Secondary | ICD-10-CM | POA: Insufficient documentation

## 2023-10-30 DIAGNOSIS — E1122 Type 2 diabetes mellitus with diabetic chronic kidney disease: Secondary | ICD-10-CM | POA: Insufficient documentation

## 2023-10-30 DIAGNOSIS — I5023 Acute on chronic systolic (congestive) heart failure: Secondary | ICD-10-CM | POA: Diagnosis present

## 2023-10-30 DIAGNOSIS — I5022 Chronic systolic (congestive) heart failure: Secondary | ICD-10-CM

## 2023-10-30 DIAGNOSIS — I130 Hypertensive heart and chronic kidney disease with heart failure and stage 1 through stage 4 chronic kidney disease, or unspecified chronic kidney disease: Secondary | ICD-10-CM | POA: Insufficient documentation

## 2023-10-30 DIAGNOSIS — L97829 Non-pressure chronic ulcer of other part of left lower leg with unspecified severity: Secondary | ICD-10-CM | POA: Diagnosis not present

## 2023-10-30 DIAGNOSIS — I428 Other cardiomyopathies: Secondary | ICD-10-CM | POA: Diagnosis not present

## 2023-10-30 DIAGNOSIS — Z95811 Presence of heart assist device: Secondary | ICD-10-CM | POA: Diagnosis not present

## 2023-10-30 DIAGNOSIS — Z79899 Other long term (current) drug therapy: Secondary | ICD-10-CM | POA: Diagnosis not present

## 2023-10-30 DIAGNOSIS — E1151 Type 2 diabetes mellitus with diabetic peripheral angiopathy without gangrene: Secondary | ICD-10-CM | POA: Diagnosis not present

## 2023-10-30 DIAGNOSIS — L97921 Non-pressure chronic ulcer of unspecified part of left lower leg limited to breakdown of skin: Secondary | ICD-10-CM | POA: Diagnosis not present

## 2023-10-30 DIAGNOSIS — Z7984 Long term (current) use of oral hypoglycemic drugs: Secondary | ICD-10-CM | POA: Diagnosis not present

## 2023-10-30 DIAGNOSIS — I2510 Atherosclerotic heart disease of native coronary artery without angina pectoris: Secondary | ICD-10-CM | POA: Diagnosis not present

## 2023-10-30 DIAGNOSIS — E785 Hyperlipidemia, unspecified: Secondary | ICD-10-CM | POA: Insufficient documentation

## 2023-10-30 DIAGNOSIS — E118 Type 2 diabetes mellitus with unspecified complications: Secondary | ICD-10-CM

## 2023-10-30 LAB — BASIC METABOLIC PANEL WITH GFR
Anion gap: 12 (ref 5–15)
BUN: 61 mg/dL — ABNORMAL HIGH (ref 8–23)
CO2: 31 mmol/L (ref 22–32)
Calcium: 9 mg/dL (ref 8.9–10.3)
Chloride: 95 mmol/L — ABNORMAL LOW (ref 98–111)
Creatinine, Ser: 2.34 mg/dL — ABNORMAL HIGH (ref 0.61–1.24)
GFR, Estimated: 29 mL/min — ABNORMAL LOW (ref >60)
Glucose, Bld: 158 mg/dL — ABNORMAL HIGH (ref 70–99)
Potassium: 4.5 mmol/L (ref 3.5–5.1)
Sodium: 138 mmol/L (ref 135–145)

## 2023-10-30 LAB — CBC
HCT: 35.9 % — ABNORMAL LOW (ref 39.0–52.0)
Hemoglobin: 11.8 g/dL — ABNORMAL LOW (ref 13.0–17.0)
MCH: 29.9 pg (ref 26.0–34.0)
MCHC: 32.9 g/dL (ref 30.0–36.0)
MCV: 90.9 fL (ref 80.0–100.0)
Platelets: 259 10*3/uL (ref 150–400)
RBC: 3.95 MIL/uL — ABNORMAL LOW (ref 4.22–5.81)
RDW: 14 % (ref 11.5–15.5)
WBC: 9.6 10*3/uL (ref 4.0–10.5)
nRBC: 0 % (ref 0.0–0.2)

## 2023-10-30 LAB — BRAIN NATRIURETIC PEPTIDE: B Natriuretic Peptide: 228.1 pg/mL — ABNORMAL HIGH (ref 0.0–100.0)

## 2023-10-30 NOTE — Progress Notes (Signed)
 Referral sent to [facility] asking for urgent review of patient for [name] stage 4. Included patient demographics and today's office note.  [facility] Ph: [phone] Fax: [phone]

## 2023-10-30 NOTE — Addendum Note (Signed)
 Encounter addended by: Sheryl Donna, NP on: 10/30/2023 2:53 PM  Actions taken: Clinical Note Signed

## 2023-10-30 NOTE — Addendum Note (Signed)
 Encounter addended by: Sheryl Donna, NP on: 10/30/2023 2:58 PM  Actions taken: Visit diagnoses modified, Diagnosis association updated

## 2023-10-30 NOTE — Addendum Note (Signed)
 Encounter addended by: Edman Gory, RN on: 10/30/2023 3:06 PM  Actions taken: Clinical Note Signed

## 2023-10-30 NOTE — Patient Instructions (Addendum)
 No change in medications. Labs today - will call you if abnormal. Referral sent to [facility]. You may reach their office at [phone] if you do not hear from them within one week. We have scheduled you to have a right heart catheterization - see below. Return to see Dr. Mitzie Anda as scheduled - see below. Please call us  at [phone] if any questions or concerns prior to your next visit.     [facility] [address] [place] [address] Dept: [phone] Loc: [phone]  [name]  10/30/2023  You are scheduled for a Cardiac Catheterization on Tuesday, May 13 with Dr. Peder Bourdon.  1. Please arrive at the [facility] (Main Entrance A) at [facility]: [address] [place], [place] [address] at 8:00 AM (This time is 2 hour(s) before your procedure to ensure your preparation).   Free valet parking service is available. You will check in at ADMITTING. The support person will be asked to wait in the waiting room.  It is OK to have someone drop you off and come back when you are ready to be discharged.    Special note: Every effort is made to have your procedure done on time. Please understand that emergencies sometimes delay scheduled procedures.  2. Diet: Do not eat solid foods after midnight.  The patient may have clear liquids until 5am upon the day of the procedure.  3. Labs: drawn today  4. Medication instructions in preparation for your procedure:   Contrast Allergy: No   HOLD THE FOLLOWING MEDICATIONS MORNING OF PROCEDURE:  TORSEMIDE   SPIRO  JARDIANCE   GLIPIZIDE    On the morning of your procedure, take any morning medicines NOT listed above.  You may use sips of water .  5. Plan to go home the same day, you will only stay overnight if medically necessary. 6. Bring a current list of your medications and current insurance cards. 7. You MUST have a responsible person  to drive you home. 8. Someone MUST be with you the first 24 hours after you arrive home or your discharge will be delayed. 9. Please wear clothes that are easy to get on and off and wear slip-on shoes.  Thank you for allowing us  to care for you!   -- [facility]

## 2023-10-30 NOTE — Progress Notes (Signed)
 ReDS Vest / Clip - 10/30/23 1343       ReDS Vest / Clip   Station Marker D    Ruler Value 34    ReDS Value Range Moderate volume overload    ReDS Actual Value 38

## 2023-11-06 ENCOUNTER — Telehealth (HOSPITAL_COMMUNITY): Payer: Self-pay

## 2023-11-12 ENCOUNTER — Ambulatory Visit (HOSPITAL_COMMUNITY)
Admission: RE | Admit: 2023-11-12 | Discharge: 2023-11-12 | Disposition: A | Discharge status: Home / Self Care | Attending: Cardiology | Admitting: Cardiology

## 2023-11-12 ENCOUNTER — Encounter (HOSPITAL_COMMUNITY): Payer: Self-pay | Admitting: Cardiology

## 2023-11-12 ENCOUNTER — Other Ambulatory Visit: Payer: Self-pay

## 2023-11-12 ENCOUNTER — Encounter (HOSPITAL_COMMUNITY)
Admission: RE | Disposition: A | Discharge status: Home / Self Care | Payer: Self-pay | Source: Home / Self Care | Attending: Cardiology

## 2023-11-12 DIAGNOSIS — I130 Hypertensive heart and chronic kidney disease with heart failure and stage 1 through stage 4 chronic kidney disease, or unspecified chronic kidney disease: Secondary | ICD-10-CM | POA: Diagnosis present

## 2023-11-12 DIAGNOSIS — E1122 Type 2 diabetes mellitus with diabetic chronic kidney disease: Secondary | ICD-10-CM | POA: Insufficient documentation

## 2023-11-12 DIAGNOSIS — I7121 Aneurysm of the ascending aorta, without rupture: Secondary | ICD-10-CM | POA: Diagnosis not present

## 2023-11-12 DIAGNOSIS — N184 Chronic kidney disease, stage 4 (severe): Secondary | ICD-10-CM | POA: Insufficient documentation

## 2023-11-12 DIAGNOSIS — I272 Pulmonary hypertension, unspecified: Secondary | ICD-10-CM | POA: Diagnosis not present

## 2023-11-12 DIAGNOSIS — Z79899 Other long term (current) drug therapy: Secondary | ICD-10-CM | POA: Diagnosis not present

## 2023-11-12 DIAGNOSIS — I509 Heart failure, unspecified: Secondary | ICD-10-CM | POA: Diagnosis not present

## 2023-11-12 DIAGNOSIS — I428 Other cardiomyopathies: Secondary | ICD-10-CM | POA: Insufficient documentation

## 2023-11-12 DIAGNOSIS — I70245 Atherosclerosis of native arteries of left leg with ulceration of other part of foot: Secondary | ICD-10-CM | POA: Diagnosis not present

## 2023-11-12 DIAGNOSIS — E11621 Type 2 diabetes mellitus with foot ulcer: Secondary | ICD-10-CM | POA: Diagnosis not present

## 2023-11-12 DIAGNOSIS — Z7984 Long term (current) use of oral hypoglycemic drugs: Secondary | ICD-10-CM | POA: Insufficient documentation

## 2023-11-12 DIAGNOSIS — G4733 Obstructive sleep apnea (adult) (pediatric): Secondary | ICD-10-CM | POA: Insufficient documentation

## 2023-11-12 DIAGNOSIS — E1151 Type 2 diabetes mellitus with diabetic peripheral angiopathy without gangrene: Secondary | ICD-10-CM | POA: Diagnosis not present

## 2023-11-12 DIAGNOSIS — E785 Hyperlipidemia, unspecified: Secondary | ICD-10-CM | POA: Insufficient documentation

## 2023-11-12 DIAGNOSIS — L97529 Non-pressure chronic ulcer of other part of left foot with unspecified severity: Secondary | ICD-10-CM | POA: Insufficient documentation

## 2023-11-12 DIAGNOSIS — I5022 Chronic systolic (congestive) heart failure: Secondary | ICD-10-CM | POA: Insufficient documentation

## 2023-11-12 HISTORY — PX: RIGHT HEART CATH: CATH118263

## 2023-11-12 LAB — POCT I-STAT EG7
Acid-Base Excess: 3 mmol/L — ABNORMAL HIGH (ref 0.0–2.0)
Acid-Base Excess: 4 mmol/L — ABNORMAL HIGH (ref 0.0–2.0)
Bicarbonate: 27.8 mmol/L (ref 20.0–28.0)
Bicarbonate: 28.7 mmol/L — ABNORMAL HIGH (ref 20.0–28.0)
Calcium, Ion: 1.11 mmol/L — ABNORMAL LOW (ref 1.15–1.40)
Calcium, Ion: 1.13 mmol/L — ABNORMAL LOW (ref 1.15–1.40)
HCT: 31 % — ABNORMAL LOW (ref 39.0–52.0)
HCT: 31 % — ABNORMAL LOW (ref 39.0–52.0)
Hemoglobin: 10.5 g/dL — ABNORMAL LOW (ref 13.0–17.0)
Hemoglobin: 10.5 g/dL — ABNORMAL LOW (ref 13.0–17.0)
O2 Saturation: 63 %
O2 Saturation: 63 %
Potassium: 3.8 mmol/L (ref 3.5–5.1)
Potassium: 3.8 mmol/L (ref 3.5–5.1)
Sodium: 138 mmol/L (ref 135–145)
Sodium: 139 mmol/L (ref 135–145)
TCO2: 29 mmol/L (ref 22–32)
TCO2: 30 mmol/L (ref 22–32)
pCO2, Ven: 43.6 mmHg — ABNORMAL LOW (ref 44–60)
pCO2, Ven: 44.7 mmHg (ref 44–60)
pH, Ven: 7.413 (ref 7.25–7.43)
pH, Ven: 7.415 (ref 7.25–7.43)
pO2, Ven: 32 mmHg (ref 32–45)
pO2, Ven: 33 mmHg (ref 32–45)

## 2023-11-12 LAB — GLUCOSE, CAPILLARY: Glucose-Capillary: 174 mg/dL — ABNORMAL HIGH (ref 70–99)

## 2023-11-12 SURGERY — RIGHT HEART CATH
Anesthesia: LOCAL

## 2023-11-12 MED ORDER — SODIUM CHLORIDE 0.9 % IV SOLN: 250.0000 mL | INTRAVENOUS | Status: DC | PRN

## 2023-11-12 MED ORDER — ACETAMINOPHEN 325 MG PO TABS: 650.0000 mg | ORAL_TABLET | ORAL | Status: DC | PRN

## 2023-11-12 MED ORDER — HYDRALAZINE HCL 20 MG/ML IJ SOLN: 10.0000 mg | INTRAMUSCULAR | Status: DC | PRN

## 2023-11-12 MED ORDER — SODIUM CHLORIDE 0.9% FLUSH: 3.0000 mL | INTRAVENOUS | Status: DC | PRN

## 2023-11-12 MED ORDER — DAPAGLIFLOZIN PROPANEDIOL 10 MG PO TABS: 10.0000 mg | ORAL_TABLET | Freq: Every day | ORAL | 1 refills | Status: AC

## 2023-11-12 MED ORDER — LIDOCAINE HCL (PF) 1 % IJ SOLN
INTRAMUSCULAR | Status: DC | PRN
  Administered 2023-11-12: 2 mL

## 2023-11-12 MED ORDER — SODIUM CHLORIDE 0.9 % IV SOLN: INTRAVENOUS | Status: DC

## 2023-11-12 MED ORDER — ONDANSETRON HCL 4 MG/2ML IJ SOLN: 4.0000 mg | Freq: Four times a day (QID) | INTRAMUSCULAR | Status: DC | PRN

## 2023-11-12 MED ORDER — SODIUM CHLORIDE 0.9% FLUSH: 3.0000 mL | Freq: Two times a day (BID) | INTRAVENOUS | Status: DC

## 2023-11-12 MED ORDER — ASPIRIN 81 MG PO CHEW
81.0000 mg | CHEWABLE_TABLET | ORAL | Status: DC
Start: 2023-11-13 — End: 2023-11-12

## 2023-11-12 MED ORDER — SPIRONOLACTONE 25 MG PO TABS: 12.5000 mg | ORAL_TABLET | Freq: Every day | ORAL | 3 refills | Status: DC

## 2023-11-12 MED ORDER — LABETALOL HCL 5 MG/ML IV SOLN: 10.0000 mg | INTRAVENOUS | Status: DC | PRN

## 2023-11-12 MED ORDER — METOLAZONE 2.5 MG PO TABS
2.5000 mg | ORAL_TABLET | ORAL | 1 refills | Status: AC
Start: 2023-11-13 — End: ?

## 2023-11-12 MED ORDER — LIDOCAINE HCL (PF) 1 % IJ SOLN
INTRAMUSCULAR | Status: AC
  Filled 2023-11-12: qty 30

## 2023-11-12 SURGICAL SUPPLY — 7 items
CATH BALLN WEDGE 5F 110CM (CATHETERS) IMPLANT
CATH SWAN GANZ 7F STRAIGHT (CATHETERS) IMPLANT
GLIDESHEATH SLENDER 7FR .021G (SHEATH) IMPLANT
PACK CARDIAC CATHETERIZATION (CUSTOM PROCEDURE TRAY) ×2 IMPLANT
SHEATH GLIDE SLENDER 4/5FR (SHEATH) IMPLANT
TRANSDUCER W/STOPCOCK (MISCELLANEOUS) IMPLANT
TUBING ART PRESS 72 MALE/FEM (TUBING) IMPLANT

## 2023-11-12 NOTE — Interval H&P Note (Signed)
 History and Physical Interval Note:  11/12/2023 10:43 AM  [name]  has presented today for surgery, with the diagnosis of systolic Heart Failure.  The various methods of treatment have been discussed with the patient and family. After consideration of risks, benefits and other options for treatment, the patient has consented to  Procedure(s): RIGHT HEART CATH (N/A) as a surgical intervention.  The patient's history has been reviewed, patient examined, no change in status, stable for surgery.  I have reviewed the patient's chart and labs.  Questions were answered to the patient's satisfaction.     Charita Lindenberger [employer]

## 2023-11-12 NOTE — Discharge Instructions (Addendum)
 Brachial Site Care   This sheet gives you information about how to care for yourself after your procedure. Your health care provider may also give you more specific instructions. If you have problems or questions, contact your health care provider. What can I expect after the procedure? After the procedure, it is common to have: Bruising and tenderness at the catheter insertion area. Follow these instructions at home:  Insertion site care Follow instructions from your health care provider about how to take care of your insertion site. Make sure you: Wash your hands with soap and water  before you change your bandage (dressing). If soap and water  are not available, use hand sanitizer. Remove your dressing as told by your health care provider. In 24 hours Check your insertion site every day for signs of infection. Check for: Redness, swelling, or pain. Pus or a bad smell. Warmth. You may shower 24 hours after the procedure. Do not apply powder or lotion to the site.  Activity For 24 hours after the procedure, or as directed by your health care provider: Do not push or pull heavy objects with the affected arm. Do not drive yourself home from the hospital or clinic. You may drive 24 hours after the procedure unless your health care provider tells you not to. Do not lift anything that is heavier than 10 lb (4.5 kg), or the limit that you are told, until your health care provider says that it is safe.  For 24 hours    He should continue to take metolazone  2.5 mg once weekly on Wednesdays along with his torsemide  100 mg twice daily and Farxiga 10 mg daily.

## 2023-11-20 ENCOUNTER — Telehealth (HOSPITAL_COMMUNITY): Payer: Self-pay | Admitting: Adult Health

## 2023-11-20 DIAGNOSIS — I5022 Chronic systolic (congestive) heart failure: Secondary | ICD-10-CM

## 2023-11-20 NOTE — Telephone Encounter (Signed)
  Cardiomems Remote Monitoring  S/P Cardiomems Implant   PAD Goal: 21 Most recent reading: 28 suggestive of fluid accumulation   Recommended changes: Please call instruct to take 2.5 mg metolazone  today   I continue to review and analyze the patients PA pressures weekly (and more often as needed) to bring PA pressures within the optimal range.    Please set up for BMET next week.   Carisha Kantor NP-C  10:09 AM

## 2023-11-21 NOTE — Addendum Note (Signed)
 Addended by: Glorietta Lark on: 11/21/2023 04:05 PM   Modules accepted: Orders

## 2023-11-21 NOTE — Telephone Encounter (Signed)
 Spoke w/pt's wife, she is aware, agreeable, and verbalized understanding, she states they have noticed pt is retaining fluid, he took reg dose of Metolazone  yesterday and will take extra dose today, labs sch for next thur 5/29

## 2023-11-27 ENCOUNTER — Ambulatory Visit: Payer: Medicare HMO | Admitting: Podiatry

## 2023-11-27 ENCOUNTER — Encounter: Payer: Self-pay | Admitting: Podiatry

## 2023-11-27 DIAGNOSIS — M79674 Pain in right toe(s): Secondary | ICD-10-CM

## 2023-11-27 DIAGNOSIS — B351 Tinea unguium: Secondary | ICD-10-CM

## 2023-11-27 DIAGNOSIS — E119 Type 2 diabetes mellitus without complications: Secondary | ICD-10-CM | POA: Diagnosis not present

## 2023-11-27 DIAGNOSIS — M79675 Pain in left toe(s): Secondary | ICD-10-CM

## 2023-11-27 NOTE — Progress Notes (Signed)
 This patient returns to my office for at risk foot care.  This patient requires this care by a professional since this patient will be at risk due to having diabetes type 2 and chronic venous insufficiency.  This patient is unable to cut nails himself since the patient cannot reach his nails.These nails are painful walking and wearing shoes.  This patient presents for at risk foot care today.  General Appearance  Alert, conversant and in no acute stress.  Vascular  Dorsalis pedis and posterior tibial  pulses are not  palpable  bilaterally.  Capillary return is within normal limits  bilaterally. Temperature is within normal limits  bilaterally.  Neurologic  Senn-Weinstein monofilament wire test diminished/absent  bilaterally. Muscle power within normal limits bilaterally.  Nails Thick disfigured discolored nails with subungual debris  from hallux to fifth toes bilaterally. No evidence of bacterial infection or drainage bilaterally.  Orthopedic  No limitations of motion  feet .  No crepitus or effusions noted.  No bony pathology or digital deformities noted.  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  Midfoot  DJD  B/L.  Limited rearfoot  ROM  B/L.  Skin  normotropic skin with no porokeratosis noted bilaterally.  No signs of infections or ulcers noted.     Onychomycosis  Pain in right toes  Pain in left toes  Consent was obtained for treatment procedures.   Mechanical debridement of nails 1-5  bilaterally performed with a nail nipper.  Filed with dremel without incident.    Return office visit    3 months                  Told patient to return for periodic foot care and evaluation due to potential at risk complications.   Helane Gunther DPM

## 2023-11-28 ENCOUNTER — Ambulatory Visit (HOSPITAL_COMMUNITY)
Admission: RE | Admit: 2023-11-28 | Discharge: 2023-11-28 | Disposition: A | Discharge status: Home / Self Care | Source: Ambulatory Visit | Attending: Internal Medicine | Admitting: Internal Medicine

## 2023-11-28 ENCOUNTER — Ambulatory Visit (HOSPITAL_COMMUNITY): Payer: Self-pay | Admitting: Adult Health

## 2023-11-28 DIAGNOSIS — I5022 Chronic systolic (congestive) heart failure: Secondary | ICD-10-CM | POA: Insufficient documentation

## 2023-11-28 LAB — BASIC METABOLIC PANEL WITH GFR
Anion gap: 15 (ref 5–15)
BUN: 55 mg/dL — ABNORMAL HIGH (ref 8–23)
CO2: 31 mmol/L (ref 22–32)
Calcium: 8.6 mg/dL — ABNORMAL LOW (ref 8.9–10.3)
Chloride: 93 mmol/L — ABNORMAL LOW (ref 98–111)
Creatinine, Ser: 2.23 mg/dL — ABNORMAL HIGH (ref 0.61–1.24)
GFR, Estimated: 30 mL/min — ABNORMAL LOW (ref >60)
Glucose, Bld: 231 mg/dL — ABNORMAL HIGH (ref 70–99)
Potassium: 3.5 mmol/L (ref 3.5–5.1)
Sodium: 139 mmol/L (ref 135–145)

## 2023-12-07 ENCOUNTER — Telehealth: Payer: Self-pay | Admitting: Cardiology

## 2023-12-07 NOTE — Telephone Encounter (Signed)
 Patient's wife Tanya Fantasia called the answering service this morning with questions about patient's med list.  Reports that he had a right heart catheterization on 5/13.  On the after visit summary after the procedure, she believes there were instructions to start/stop medications.  She is unsure what the medications were and she had already thrown out the AVS. She tried to call the pharmacy, but they told her that no new medications were sent in.   I reviewed AVS from 5/13- instructions were to start farxiga  and metolazone , stop jardiance  and valsartan . Per patient's wife, these medication changes had already been made by the time of the catheterization. Explained that sometimes when the medication list is updated after a procedure, it shows certain medications as being started or stopped, even if those changes were made in the outpatient setting prior. Dr. Charline Contras cath note read "I am going to continue him on torsemide  100 mg bid with metolazone  2.5 mg once weekly. He has just restarted SGLT2 inhibitor with Farxiga  10 mg daily, so will not make other changes today as this will give a little more diuresis." Discussed this with wife.   Patient currently taking farxiga , metolazone , spironolactone , torsemide , carvedilol . I reviewed his med list in the chart which matched what he has been taking at home. Reassured wife that patient seems to be on the proper medication regiment. She asked that I review recent lab work to see if there were any medication changes associated with that- BMP from 5/29 showed stable renal function and no medication changes were made.    Patient's wife concerned that farixga is not as affective as jardiance , and she feels like patient's swelling has been a little more significant since the med change. Cardiomems followed by [facility]. I will forward this note to Nieves Bars NP who has seen the patient in the past for further instruction. With his creatinine being 2.23 when last checked in 5/29, I am  hesitant to increase diuretics as I have not seen the patient   Debria Fang, PA-C 12/07/2023 8:28 AM

## 2023-12-09 NOTE — Telephone Encounter (Signed)
 Thank you for the information. He has follow up this week and we will sort out. I will also involve our pharmacy team.   Nieves Bars NP-C  7:57 AM

## 2023-12-13 ENCOUNTER — Ambulatory Visit (HOSPITAL_COMMUNITY): Payer: Self-pay | Admitting: Cardiology

## 2023-12-13 ENCOUNTER — Encounter (HOSPITAL_COMMUNITY): Payer: Self-pay | Admitting: Cardiology

## 2023-12-13 ENCOUNTER — Ambulatory Visit (HOSPITAL_COMMUNITY)
Admission: RE | Admit: 2023-12-13 | Discharge: 2023-12-13 | Disposition: A | Discharge status: Home / Self Care | Source: Ambulatory Visit | Attending: Family Medicine | Admitting: Family Medicine

## 2023-12-13 ENCOUNTER — Ambulatory Visit (HOSPITAL_COMMUNITY)
Admission: RE | Admit: 2023-12-13 | Discharge: 2023-12-13 | Disposition: A | Discharge status: Home / Self Care | Source: Ambulatory Visit | Attending: Cardiology | Admitting: Cardiology

## 2023-12-13 VITALS — BP 126/82 | HR 78 | Ht 67.0 in | Wt 198.8 lb

## 2023-12-13 DIAGNOSIS — R55 Syncope and collapse: Secondary | ICD-10-CM | POA: Diagnosis not present

## 2023-12-13 DIAGNOSIS — I080 Rheumatic disorders of both mitral and aortic valves: Secondary | ICD-10-CM | POA: Insufficient documentation

## 2023-12-13 DIAGNOSIS — Z7984 Long term (current) use of oral hypoglycemic drugs: Secondary | ICD-10-CM | POA: Insufficient documentation

## 2023-12-13 DIAGNOSIS — I428 Other cardiomyopathies: Secondary | ICD-10-CM | POA: Insufficient documentation

## 2023-12-13 DIAGNOSIS — N184 Chronic kidney disease, stage 4 (severe): Secondary | ICD-10-CM | POA: Insufficient documentation

## 2023-12-13 DIAGNOSIS — Z8673 Personal history of transient ischemic attack (TIA), and cerebral infarction without residual deficits: Secondary | ICD-10-CM | POA: Diagnosis not present

## 2023-12-13 DIAGNOSIS — I447 Left bundle-branch block, unspecified: Secondary | ICD-10-CM | POA: Diagnosis not present

## 2023-12-13 DIAGNOSIS — E785 Hyperlipidemia, unspecified: Secondary | ICD-10-CM | POA: Insufficient documentation

## 2023-12-13 DIAGNOSIS — R079 Chest pain, unspecified: Secondary | ICD-10-CM | POA: Diagnosis present

## 2023-12-13 DIAGNOSIS — Z79899 Other long term (current) drug therapy: Secondary | ICD-10-CM | POA: Insufficient documentation

## 2023-12-13 DIAGNOSIS — G4733 Obstructive sleep apnea (adult) (pediatric): Secondary | ICD-10-CM | POA: Insufficient documentation

## 2023-12-13 DIAGNOSIS — R0602 Shortness of breath: Secondary | ICD-10-CM | POA: Diagnosis present

## 2023-12-13 DIAGNOSIS — Z8701 Personal history of pneumonia (recurrent): Secondary | ICD-10-CM | POA: Diagnosis not present

## 2023-12-13 DIAGNOSIS — I130 Hypertensive heart and chronic kidney disease with heart failure and stage 1 through stage 4 chronic kidney disease, or unspecified chronic kidney disease: Secondary | ICD-10-CM | POA: Diagnosis not present

## 2023-12-13 DIAGNOSIS — I5022 Chronic systolic (congestive) heart failure: Secondary | ICD-10-CM | POA: Diagnosis not present

## 2023-12-13 DIAGNOSIS — L97909 Non-pressure chronic ulcer of unspecified part of unspecified lower leg with unspecified severity: Secondary | ICD-10-CM | POA: Diagnosis not present

## 2023-12-13 DIAGNOSIS — E1122 Type 2 diabetes mellitus with diabetic chronic kidney disease: Secondary | ICD-10-CM | POA: Diagnosis not present

## 2023-12-13 DIAGNOSIS — E1151 Type 2 diabetes mellitus with diabetic peripheral angiopathy without gangrene: Secondary | ICD-10-CM | POA: Diagnosis not present

## 2023-12-13 DIAGNOSIS — I7121 Aneurysm of the ascending aorta, without rupture: Secondary | ICD-10-CM | POA: Insufficient documentation

## 2023-12-13 DIAGNOSIS — I7123 Aneurysm of the descending thoracic aorta, without rupture: Secondary | ICD-10-CM

## 2023-12-13 DIAGNOSIS — G459 Transient cerebral ischemic attack, unspecified: Secondary | ICD-10-CM

## 2023-12-13 DIAGNOSIS — E876 Hypokalemia: Secondary | ICD-10-CM

## 2023-12-13 DIAGNOSIS — I5032 Chronic diastolic (congestive) heart failure: Secondary | ICD-10-CM | POA: Diagnosis present

## 2023-12-13 LAB — ECHOCARDIOGRAM COMPLETE
AR max vel: 1.6 cm2
AV Area VTI: 1.68 cm2
AV Area mean vel: 1.58 cm2
AV Mean grad: 9.5 mmHg
AV Peak grad: 18.4 mmHg
Ao pk vel: 2.15 m/s
Area-P 1/2: 3.28 cm2
S' Lateral: 4.5 cm

## 2023-12-13 LAB — BASIC METABOLIC PANEL WITH GFR
Anion gap: 17 — ABNORMAL HIGH (ref 5–15)
BUN: 52 mg/dL — ABNORMAL HIGH (ref 8–23)
CO2: 28 mmol/L (ref 22–32)
Calcium: 8.6 mg/dL — ABNORMAL LOW (ref 8.9–10.3)
Chloride: 93 mmol/L — ABNORMAL LOW (ref 98–111)
Creatinine, Ser: 2.42 mg/dL — ABNORMAL HIGH (ref 0.61–1.24)
GFR, Estimated: 27 mL/min — ABNORMAL LOW (ref >60)
Glucose, Bld: 161 mg/dL — ABNORMAL HIGH (ref 70–99)
Potassium: 3 mmol/L — ABNORMAL LOW (ref 3.5–5.1)
Sodium: 138 mmol/L (ref 135–145)

## 2023-12-13 LAB — BRAIN NATRIURETIC PEPTIDE: B Natriuretic Peptide: 291.1 pg/mL — ABNORMAL HIGH (ref 0.0–100.0)

## 2023-12-13 MED ORDER — SPIRONOLACTONE 25 MG PO TABS: 25.0000 mg | ORAL_TABLET | Freq: Every day | ORAL | 3 refills | Status: AC

## 2023-12-13 NOTE — Patient Instructions (Signed)
 Medication Changes:  PLEASE BRING MEDICATIONS TO YOUR APPOINTMENTS   INCREASE SPIROLACTONE TO 25MG  ONCE DAILY   Lab Work:  Labs done today, your results will be available in MyChart, we will contact you for abnormal readings.  THEN RETURN IN 10 DAYS AS SCHEDULED   Testing/Procedures:  SCHEDULING WILL CALL TO SCHEDULE THIS ONCE APPROVED WITH INSURANCE   [facility] [address] [place], [place] [address] Please take advantage of the free valet parking available at the [facility] and [employer] (Entrance C).  Proceed to the [facility] Radiology Department (First Floor) for check-in.   Magnetic resonance imaging (MRI) is a painless test that produces images of the inside of the body without using Xrays.  During an MRI, strong magnets and radio waves work together in a Data processing manager to form detailed images.   MRI images may provide more details about a medical condition than X-rays, CT scans, and ultrasounds can provide.  You may be given earphones to listen for instructions.  You may eat a light breakfast and take medications as ordered with the exception of furosemide , hydrochlorothiazide, chlorthalidone or spironolactone  (or any other fluid pill). If you are undergoing a stress MRI, please avoid stimulants for 12 hr prior to test. (I.e. Caffeine, nicotine, chocolate, or antihistamine medications)  If your provider has ordered anti-anxiety medications for this test, then you will need a driver.  An IV will be inserted into one of your veins. Contrast material will be injected into your IV. It will leave your body through your urine within a day. You may be told to drink plenty of fluids to help flush the contrast material out of your system.  You will be asked to remove all metal, including: Watch, jewelry, and other metal objects including hearing aids, hair pieces and dentures. Also wearable glucose monitoring systems (ie. Freestyle Libre and Omnipods) (Braces  and fillings normally are not a problem.)   TEST WILL TAKE APPROXIMATELY 1 HOUR  PLEASE NOTIFY SCHEDULING AT LEAST 24 HOURS IN ADVANCE IF YOU ARE UNABLE TO KEEP YOUR APPOINTMENT. [phone]  For more information and frequently asked questions, please visit our website : [email or web address]  Please call the Cardiac Imaging Nurse Navigators with any questions/concerns. [phone] Office   Follow-Up in: 6 WEEKS AS SCHEDULED WITH APP   At the [facility], you and your health needs are our priority. We have a designated team specialized in the treatment of Heart Failure. This Care Team includes your primary Heart Failure Specialized Cardiologist (physician), Advanced Practice Providers (APPs- Physician Assistants and Nurse Practitioners), and Pharmacist who all work together to provide you with the care you need, when you need it.   You may see any of the following providers on your designated Care Team at your next follow up:  Dr. Jules Oar Dr. Peder Bourdon Dr. Alwin Baars Dr. Judyth Nunnery Nieves Bars, NP Ruddy Corral, [facility] [place], [place] Dennise Fitz, NP [place] Lee, NP Luster Salters, PharmD   Please be sure to bring in all your medications bottles to every appointment.   Need to Contact Us :  If you have any questions or concerns before your next appointment please send us  a message through [place] or call our office at [phone].    TO LEAVE A MESSAGE FOR THE NURSE SELECT OPTION 2, PLEASE LEAVE A MESSAGE INCLUDING: YOUR NAME DATE OF BIRTH CALL BACK NUMBER REASON FOR CALL**this is important as we prioritize the call backs  YOU WILL RECEIVE  A CALL BACK THE SAME DAY AS LONG AS YOU CALL BEFORE 4:00 PM

## 2023-12-13 NOTE — Progress Notes (Signed)
 Echocardiogram 2D Echocardiogram has been performed.  [name] 12/13/2023, 3:07 PM

## 2023-12-14 NOTE — Progress Notes (Signed)
 [facility] Note  PCP: Hershell Lose, NP HF Cardiology: Dr. Mitzie Anda  Chief complaint: CHF  73 y.o. with history of CHF, [name], [name] for followup of CHF. He has a history of mild LV systolic dysfunction (EF 45-50% range) with probably significant RV failure. Patient was admitted in 4/20 with hypoxic respiratory failure due to PNA and decompensated HF. Hospitalization was complicated by PEA arrest and AKI.   In 6/20, he was again admitted with hypoxemic respiratory failure with hypertensive crisis, CHF, and PNA.    Due to frequent hospital admission for CHF, underwent Cardiomems implantation 11/20.  RHC at that time showed optimized filling pressures after adjustment of torsemide . Outpatient sleep study found to have severe OSA and is now using BiPAP. He was also referred to HF paramedicine program.   Echo 6/21 showed EF 45-50%, grade I DD, RV normal, mild to moderate TR.   Last seen 01/2020. Discharged from paramedicine 9/21 after demonstrating competency managing his medications. Unfortunately lost to follow up but has been using Cardiomems regularly.  Follow up 10/23, we had not seen him since 01/2020. NYHA II-early III symptoms and volume overloaded. Torsemide  increased to 80 bid and metolazone  2.5 mg added weekly.  Echo 3/24 showed EF 35-40% with paradoxical septal motion, consistent with LBBB, grade I DD, normal RV, mild to moderate AI, AoRoot dilation 41 mm, ascending aorta 45 mm.   cMRI 7/24 showed mild LV dilation with EF 38%, septal-lateral dyssynchrony, RV EF 47%, nonspecific inferior RV insertion site LGE, moderate MR.   RHC in 5/25 showed mildly elevated filling pressures with preserved cardiac output.  Farxiga  was restarted.   Echo was done today and I reviewed.  I think EF is around 35% with septal-lateral dyssynchrony consistent with LBBB, normal RV size and systolic function, mild MR, functionally bicuspid aortic valve with mild AS and  moderate aortic insufficiency, ascending aorta 4.8 cm.   Patient returns for followup of CHF.  He continues to use CPAP nightly.  Cardiomems reading high recently until the last couple of days, he restarted once weekly metolazone  for the first time in several months on Sunday. Weight down 4 lbs.  No dyspnea walking on flat ground with his cane.  No chest pain.  No orthopnea/PND.  He does report bendopnea.   Cardiomems (personally reviewed): Today's reading 22, goal is 21   Labs (12/24): LDL 70 Labs (5/25): K 3.5, creatinine 2.23  ECG (personally reviewed): NSR, LBBB 188 msec, PVCs  PMH: 1. HTN 2. [name] stage 3 3. Type 2 diabetes 4. Hyperlipidemia 5. COPD: Suspected 6. PNA: Recurrent, suspected aspiration.  7. PEA arrest 4/20: Respiratory arrest.  8. LBBB 9. TIA 10. Ascending aortic aneurysm: CT chest in 6/20 with 5.1 cm ascending aortic aneurysm. - CT chest w/o contrast (12/23): 4.9 cm ascending aorta.  - Echo (6/25): 4.8 cm ascending aorta.  11. 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  - Echo (12/19): EF 45-50%.  - Echo (4/20): EF 45-50%, diffuse hypokinesis, normal RV.  - Cardiomems placement (11/20). - RHC (11/20): mean RA 2, PA 33/8, mean PCWP 11, CI 3.32 - Echo (6/21): EF 45-50%, grade I DD, RV normal, mild to moderate TR - Echo (3/24): EF 35-40%, evidence of LBBB - cMRI (7/24): showed mild LV dilation with EF 38%, septal-lateral dyssynchrony, RV EF 47%, nonspecific inferior RV insertion site LGE, moderate MR.  - RHC (5/25): mean RA 9, PA 40/20 mean 27, mean PCWP 19,  CI 2.67 F/3.03 T, PAPi 2.2, PVR 1.4 WU - Echo (6/25): EF 35% with septal-lateral dyssynchrony consistent with LBBB, normal RV size and systolic function, mild MR, functionally bicuspid aortic valve with mild AS and moderate aortic insufficiency, ascending aorta 4.8 cm.  12. Ventricular ectopy: 8/20 monitor showed PVCs and NSVT.  13. Peripheral arterial dopplers (10/20): No significant  stenosis.   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 OSA: Bipap  Social History   Socioeconomic History   Marital status: Married    Spouse name: Not on file   Number of children: Not on file   Years of education: Not on file   Highest education level: Not on file  Occupational History   Not on file  Tobacco Use   Smoking status: Never   Smokeless tobacco: Never  Vaping Use   Vaping status: Never Used  Substance and Sexual Activity   Alcohol  use: No   Drug use: No   Sexual activity: Not Currently    Partners: Female    Birth control/protection: None  Other Topics Concern   Not on file  Social History Narrative   Patient lives in private residence with supportive spouse   Social Drivers of Health   Financial Resource Strain: Low Risk  (10/15/2017)   Overall Financial Resource Strain (CARDIA)    Difficulty of Paying Living Expenses: Not very hard  Food Insecurity: No Food Insecurity (10/15/2017)   Hunger Vital Sign    Worried About Running Out of Food in the Last Year: Never true    Ran Out of Food in the Last Year: Never true  Transportation Needs: Unknown (10/15/2017)   PRAPARE - Transportation    Lack of Transportation (Medical): Patient declined    Lack of Transportation (Non-Medical): Patient declined  Physical Activity: Inactive (10/15/2017)   Exercise Vital Sign    Days of Exercise per Week: 0 days    Minutes of Exercise per Session: 0 min  Stress: No Stress Concern Present (10/15/2017)   [employer] of Occupational Health - Occupational Stress Questionnaire    Feeling of Stress : Not at all  Social Connections: Unknown (10/15/2017)   Social Connection and Isolation Panel    Frequency of Communication with Friends and Family: Patient declined    Frequency of Social Gatherings with Friends and Family: Patient declined    Attends Religious Services: Patient declined    Database administrator or Organizations: Patient declined    Attends Tax inspector Meetings: Patient declined    Marital Status: Patient declined  Intimate Partner Violence: Not At Risk (10/15/2017)   Humiliation, Afraid, Rape, and Kick questionnaire    Fear of Current or Ex-Partner: No    Emotionally Abused: No    Physically Abused: No    Sexually Abused: No   Family History  Problem Relation Age of Onset   Emphysema Mother    Aneurysm Mother    Emphysema Father    Coronary artery disease Brother    Coronary artery disease Brother    Coronary artery disease Sister    ROS: All systems reviewed and negative except as per HPI.   Current Outpatient Medications  Medication Sig Dispense Refill   acetaminophen  (TYLENOL ) 500 MG tablet Take 500-1,000 mg by mouth every 6 (six) hours as needed for moderate pain (pain score 4-6).     ASPIRIN  81 PO Take 81 mg by mouth daily.     atorvastatin  (LIPITOR) 40 MG tablet Take 1 tablet (40 mg  total) by mouth every evening. 30 tablet 11   carvedilol  (COREG ) 12.5 MG tablet TAKE ONE TABLET BY MOUTH TWICE A DAY 180 tablet 3   dapagliflozin  propanediol (FARXIGA ) 10 MG TABS tablet Take 1 tablet (10 mg total) by mouth daily before breakfast. 90 tablet 1   finasteride  (PROSCAR ) 5 MG tablet Take 5 mg by mouth daily.     fluticasone  (FLONASE) 50 MCG/ACT nasal spray Place 1 spray into both nostrils daily as needed for allergies or rhinitis.     gabapentin  (NEURONTIN ) 300 MG capsule Take 300 mg by mouth 2 (two) times daily.     glipiZIDE  (GLUCOTROL  XL) 5 MG 24 hr tablet Take 5 mg by mouth daily.     Iron Combinations (IRON COMPLEX PO) Take 1 tablet by mouth daily.     KLOR-CON  M20 20 MEQ tablet TAKE 2 TABLETS BY MOUTH DAILY 60 tablet 6   Loratadine (ALLERGY RELIEF 24-HR PO) Take 1 tablet by mouth daily.     metolazone  (ZAROXOLYN ) 2.5 MG tablet Take 1 tablet (2.5 mg total) by mouth once a week. 30 tablet 1   montelukast  (SINGULAIR ) 10 MG tablet Take 10 mg by mouth daily.      Multiple Vitamin (MULTIVITAMIN) tablet Take 1 tablet by  mouth daily.     pantoprazole  (PROTONIX ) 40 MG tablet Take 1 tablet (40 mg total) by mouth daily. 30 tablet 1   SYMBICORT 80-4.5 MCG/ACT inhaler Inhale 2 puffs into the lungs daily as needed (wheezing).     tamsulosin  (FLOMAX ) 0.4 MG CAPS capsule Take 0.4 mg by mouth daily.     torsemide  (DEMADEX ) 100 MG tablet Take 1 tablet (100 mg total) by mouth 2 (two) times daily. 180 tablet 3   VENTOLIN  HFA 108 (90 Base) MCG/ACT inhaler Inhale 2 puffs into the lungs every 6 (six) hours as needed for wheezing or shortness of breath.      spironolactone  (ALDACTONE ) 25 MG tablet Take 1 tablet (25 mg total) by mouth daily. 90 tablet 3   No current facility-administered medications for this encounter.   BP 126/82   Pulse 78   Ht 5' 7 (1.702 m)   Wt 90.2 kg (198 lb 12.8 oz)   SpO2 94%   BMI 31.14 kg/m    Wt Readings from Last 3 Encounters:  12/13/23 90.2 kg (198 lb 12.8 oz)  11/12/23 108.9 kg (240 lb)  10/30/23 91.9 kg (202 lb 9.6 oz)   Physical Exam: General: NAD Neck: JVP not elevated, no thyromegaly or thyroid nodule.  Lungs: Clear to auscultation bilaterally with normal respiratory effort. CV: Nondisplaced PMI.  Heart regular S1/S2, no S3/S4, 2/6 early SEM RUSB.  1+ edema 1/2 to knees bilaterally.  No carotid bruit.  Difficult to palpate pedal pulses.  Abdomen: Soft, nontender, no hepatosplenomegaly, no distention.  Skin: Intact without lesions or rashes.  Neurologic: Alert and oriented x 3.  Psych: Normal affect. Extremities: No clubbing or cyanosis.  HEENT: Normal.    Assessment/Plan: 1. Chronic systolic CHF:  Nonischemic cardiomyopathy.  Echo 4/20 with EF 45-50%.  Cath in 9/12 showed nonobstructive coronary disease.  S/p Cardiomems implant 11/20. Echo (6/21) with EF 45-50%, grade I DD, RV normal, mild to moderate TR. Echo (3/24) showed EF 35-40% with paradoxical septal motion. Cardiac MRI in 7/24 showed mild LV dilation with EF 38%, septal-lateral dyssynchrony, RV EF 47%, nonspecific  inferior RV insertion site LGE (not suggestive of coronary disease), moderate MR. Mod AI.  Echo today on my read showed EF  35% with septal-lateral dyssynchrony consistent with LBBB, normal RV size and systolic function, mild MR, functionally bicuspid aortic valve with mild AS and moderate aortic insufficiency, ascending aorta 4.8 cm. NYHA class II-III chronically. No significantly volume overloaded on exam, Cardiomems close to goal.  - Continue Torsemide  100 mg bid with metolazone  once weekly on Sundays.  BMET/BNP today.  - Increase spironolactone  to 25 mg daily.  BMET in 10 days.  - Continue Coreg  12.5 mg bid.  - Continue Jardiance  10 mg daily.  - Continue to follow Cardiomems.  - Patient has a wide LBBB with dyssynchrony, echo shows borderline LVEF for CRT (35% on my read today).  I will repeat a cardiac MRI to more exactly quantify LV EF.  If < 35%, will refer to EP to consider CRT-D.  - He needs to bring all his meds in with him to appointments, seems to have some confusion about what he is actually taking.     2. [name]: Stage IV. BMET today.  3. OSA: On Bipap. Continue nightly.  4. Lower extremity ulcerations: Possibly venous stasis. Followed by podiatry. 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  5. Ascending aortic aneurysm: CT chest 6/20 with 5.1 cm ascending aortic aneurysm.  CT chest w/o contrast 12/23 with 4.9 cm ascending aorta.  - Will get MRA chest to reassess ascending aorta with cardiac MRI.  6. Hyperlipidemia: Goal LDL < 70 with diabetes.  - Continue atorvastatin . 7. Diabetes Type 2:  - Management per PCP.  Followup 6 wks with APP.   I spent 31 minutes reviewing records, interviewing/examining patient, and managing orders.   [name]  12/14/23

## 2023-12-16 MED ORDER — POTASSIUM CHLORIDE CRYS ER 20 MEQ PO TBCR: 40.0000 meq | EXTENDED_RELEASE_TABLET | Freq: Every day | ORAL | 3 refills | Status: AC

## 2023-12-16 NOTE — Telephone Encounter (Signed)
 Called patient's wife per Dr. Mitzie Anda with following lab results and instructions:  We increased his spironolactone  today, but would also make sure he is taking KCl 40 daily. If he is, increase to 40 qam/20 qpm. Needs BMET in 1 week.  Wife says he has been out of potassium. Refill sent today asking her to make sure he starts 40 meq daily. He has repeat labs scheduled next week and potassium level can be re-assessed at that time. Wife verbalized understanding of same.

## 2023-12-23 ENCOUNTER — Ambulatory Visit (HOSPITAL_COMMUNITY)
Admission: RE | Admit: 2023-12-23 | Discharge: 2023-12-23 | Disposition: A | Discharge status: Home / Self Care | Source: Ambulatory Visit | Attending: Internal Medicine | Admitting: Internal Medicine

## 2023-12-23 DIAGNOSIS — I5022 Chronic systolic (congestive) heart failure: Secondary | ICD-10-CM | POA: Insufficient documentation

## 2023-12-23 LAB — BASIC METABOLIC PANEL WITH GFR
Anion gap: 19 — ABNORMAL HIGH (ref 5–15)
BUN: 55 mg/dL — ABNORMAL HIGH (ref 8–23)
CO2: 24 mmol/L (ref 22–32)
Calcium: 8.6 mg/dL — ABNORMAL LOW (ref 8.9–10.3)
Chloride: 93 mmol/L — ABNORMAL LOW (ref 98–111)
Creatinine, Ser: 2.25 mg/dL — ABNORMAL HIGH (ref 0.61–1.24)
GFR, Estimated: 30 mL/min — ABNORMAL LOW (ref >60)
Glucose, Bld: 256 mg/dL — ABNORMAL HIGH (ref 70–99)
Potassium: 3.9 mmol/L (ref 3.5–5.1)
Sodium: 136 mmol/L (ref 135–145)

## 2023-12-24 ENCOUNTER — Ambulatory Visit (HOSPITAL_COMMUNITY): Payer: Self-pay | Admitting: Cardiology

## 2023-12-31 ENCOUNTER — Encounter: Payer: Self-pay | Admitting: Nephrology

## 2023-12-31 ENCOUNTER — Other Ambulatory Visit: Payer: Self-pay | Admitting: Nephrology

## 2023-12-31 DIAGNOSIS — N184 Chronic kidney disease, stage 4 (severe): Secondary | ICD-10-CM

## 2024-01-01 ENCOUNTER — Other Ambulatory Visit

## 2024-01-13 ENCOUNTER — Telehealth: Payer: Self-pay | Admitting: Family

## 2024-01-13 DIAGNOSIS — I5022 Chronic systolic (congestive) heart failure: Secondary | ICD-10-CM

## 2024-01-13 NOTE — Telephone Encounter (Addendum)
  Cardiomems Remote Monitoring  S/P Cardiomems Implant 05/27/2019  PAD Goal: 21 Most recent reading: 27 which indicates fluid accumulation  Recommended changes: Take an extra metolazone  with an extra 40meq potassium today. Follow low sodium diet and fluid intake closely. Needs BMET later this week.   I continue to review and analyze the patients PA pressures weekly (and more often as needed) to bring PA pressures within the optimal range.

## 2024-01-13 NOTE — Addendum Note (Signed)
 Addended by: SHARL GRATE A on: 01/13/2024 01:41 PM   Modules accepted: Orders

## 2024-01-13 NOTE — Telephone Encounter (Signed)
 Spoke to pt to relay new advice per Ellouise Class, FNP. Both pt and wife agreeable to medication changes and blood work either Thursday or Friday. Orders placed. No further questions at this time.

## 2024-01-13 NOTE — Telephone Encounter (Deleted)
 SABRA

## 2024-01-15 ENCOUNTER — Other Ambulatory Visit (HOSPITAL_COMMUNITY): Payer: Self-pay | Admitting: Cardiology

## 2024-01-17 ENCOUNTER — Ambulatory Visit (HOSPITAL_COMMUNITY)
Admission: RE | Admit: 2024-01-17 | Discharge: 2024-01-17 | Disposition: A | Discharge status: Home / Self Care | Source: Ambulatory Visit | Attending: Cardiology | Admitting: Cardiology

## 2024-01-17 ENCOUNTER — Ambulatory Visit: Payer: Self-pay | Admitting: Family

## 2024-01-17 DIAGNOSIS — I5022 Chronic systolic (congestive) heart failure: Secondary | ICD-10-CM | POA: Diagnosis present

## 2024-01-17 LAB — BASIC METABOLIC PANEL WITH GFR
Anion gap: 12 (ref 5–15)
BUN: 50 mg/dL — ABNORMAL HIGH (ref 8–23)
CO2: 30 mmol/L (ref 22–32)
Calcium: 8.6 mg/dL — ABNORMAL LOW (ref 8.9–10.3)
Chloride: 93 mmol/L — ABNORMAL LOW (ref 98–111)
Creatinine, Ser: 2.27 mg/dL — ABNORMAL HIGH (ref 0.61–1.24)
GFR, Estimated: 30 mL/min — ABNORMAL LOW (ref >60)
Glucose, Bld: 197 mg/dL — ABNORMAL HIGH (ref 70–99)
Potassium: 4 mmol/L (ref 3.5–5.1)
Sodium: 135 mmol/L (ref 135–145)

## 2024-01-24 ENCOUNTER — Telehealth (HOSPITAL_COMMUNITY): Payer: Self-pay

## 2024-01-24 NOTE — Progress Notes (Signed)
 [facility] Note  PCP: Leontine Cramp, NP HF Cardiology: Dr. Rolan  73 y.o. with history of CHF, [name], [name] for followup of CHF. He has a history of mild LV systolic dysfunction (EF 45-50% range) with probably significant RV failure. Patient was admitted in 4/20 with hypoxic respiratory failure due to PNA and decompensated HF. Hospitalization was complicated by PEA arrest and AKI.   In 6/20, he was again admitted with hypoxemic respiratory failure with hypertensive crisis, CHF, and PNA.    Due to frequent hospital admission for CHF, underwent Cardiomems implantation 11/20.  RHC at that time showed optimized filling pressures after adjustment of torsemide . Outpatient sleep study found to have severe OSA and is now using BiPAP. He was also referred to HF paramedicine program.   Echo 6/21 showed EF 45-50%, grade I DD, RV normal, mild to moderate TR.   Last seen 01/2020. Discharged from paramedicine 9/21 after demonstrating competency managing his medications. Unfortunately lost to follow up but has been using Cardiomems regularly.  Follow up 10/23, we had not seen him since 01/2020. NYHA II-early III symptoms and volume overloaded. Torsemide  increased to 80 bid and metolazone  2.5 mg added weekly.  Echo 3/24 showed EF 35-40% with paradoxical septal motion, consistent with LBBB, grade I DD, normal RV, mild to moderate AI, AoRoot dilation 41 mm, ascending aorta 45 mm.   cMRI 7/24 showed mild LV dilation with EF 38%, septal-lateral dyssynchrony, RV EF 47%, nonspecific inferior RV insertion site LGE, moderate MR.   RHC in 5/25 showed mildly elevated filling pressures with preserved cardiac output.  Farxiga  was restarted.   Echo 6/25, Dr. Rolan felt EFaround 35% with septal-lateral dyssynchrony consistent with LBBB, normal RV size and systolic function, mild MR, functionally bicuspid aortic valve with mild AS and moderate aortic insufficiency, ascending aorta  4.8 cm.   Today he returns for HF follow up with his wife. Overall feeling fine. Breathing is great. Feels occasional fatigue but no dyspnea walking with his rolling walker or with ADLs. Occasional LE edema, but swelling controlled today. Denies palpitations, abnormal bleeding, CP, dizziness, or PND/Orthopnea. Appetite ok. Does not weigh at home. Taking all medications. Not wearing CPAP.  Cardiomems (personally reviewed): Today's reading 19, goal is 21   Labs (12/24): LDL 70 Labs (5/25): K 3.5, creatinine 2.23 Labs (7/25): K 4.0, creatinine 2.27  ECG (personally reviewed): none ordered today.  PMH: 1. HTN 2. [name] stage 3 3. Type 2 diabetes 4. Hyperlipidemia 5. COPD: Suspected 6. PNA: Recurrent, suspected aspiration.  7. PEA arrest 4/20: Respiratory arrest.  8. LBBB 9. TIA 10. Ascending aortic aneurysm: CT chest in 6/20 with 5.1 cm ascending aortic aneurysm. - CT chest w/o contrast (12/23): 4.9 cm ascending aorta.  - Echo (6/25): 4.8 cm ascending aorta.  11. 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  - Echo (12/19): EF 45-50%.  - Echo (4/20): EF 45-50%, diffuse hypokinesis, normal RV.  - Cardiomems placement (11/20). - RHC (11/20): mean RA 2, PA 33/8, mean PCWP 11, CI 3.32 - Echo (6/21): EF 45-50%, grade I DD, RV normal, mild to moderate TR - Echo (3/24): EF 35-40%, evidence of LBBB - cMRI (7/24): showed mild LV dilation with EF 38%, septal-lateral dyssynchrony, RV EF 47%, nonspecific inferior RV insertion site LGE, moderate MR.  - RHC (5/25): mean RA 9, PA 40/20 mean 27, mean PCWP 19, CI 2.67 F/3.03 T, PAPi 2.2, PVR 1.4 WU - Echo (6/25): EF 35%  with septal-lateral dyssynchrony consistent with LBBB, normal RV size and systolic function, mild MR, functionally bicuspid aortic valve with mild AS and moderate aortic insufficiency, ascending aorta 4.8 cm.  12. Ventricular ectopy: 8/20 monitor showed PVCs and NSVT.  13. Peripheral arterial dopplers (10/20): No  significant stenosis.   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 OSA: Bipap  Social History   Socioeconomic History   Marital status: Married    Spouse name: Not on file   Number of children: Not on file   Years of education: Not on file   Highest education level: Not on file  Occupational History   Not on file  Tobacco Use   Smoking status: Never   Smokeless tobacco: Never  Vaping Use   Vaping status: Never Used  Substance and Sexual Activity   Alcohol  use: No   Drug use: No   Sexual activity: Not Currently    Partners: Female    Birth control/protection: None  Other Topics Concern   Not on file  Social History Narrative   Patient lives in private residence with supportive spouse   Social Drivers of Health   Financial Resource Strain: Low Risk  (10/15/2017)   Overall Financial Resource Strain (CARDIA)    Difficulty of Paying Living Expenses: Not very hard  Food Insecurity: No Food Insecurity (10/15/2017)   Hunger Vital Sign    Worried About Running Out of Food in the Last Year: Never true    Ran Out of Food in the Last Year: Never true  Transportation Needs: Unknown (10/15/2017)   PRAPARE - Transportation    Lack of Transportation (Medical): Patient declined    Lack of Transportation (Non-Medical): Patient declined  Physical Activity: Inactive (10/15/2017)   Exercise Vital Sign    Days of Exercise per Week: 0 days    Minutes of Exercise per Session: 0 min  Stress: No Stress Concern Present (10/15/2017)   [employer] of Occupational Health - Occupational Stress Questionnaire    Feeling of Stress : Not at all  Social Connections: Unknown (10/15/2017)   Social Connection and Isolation Panel    Frequency of Communication with Friends and Family: Patient declined    Frequency of Social Gatherings with Friends and Family: Patient declined    Attends Religious Services: Patient declined    Database administrator or Organizations: Patient declined    Attends  Banker Meetings: Patient declined    Marital Status: Patient declined  Intimate Partner Violence: Not At Risk (10/15/2017)   Humiliation, Afraid, Rape, and Kick questionnaire    Fear of Current or Ex-Partner: No    Emotionally Abused: No    Physically Abused: No    Sexually Abused: No   Family History  Problem Relation Age of Onset   Emphysema Mother    Aneurysm Mother    Emphysema Father    Coronary artery disease Brother    Coronary artery disease Brother    Coronary artery disease Sister    ROS: All systems reviewed and negative except as per HPI.   Current Outpatient Medications  Medication Sig Dispense Refill   acetaminophen  (TYLENOL ) 500 MG tablet Take 500-1,000 mg by mouth every 6 (six) hours as needed for moderate pain (pain score 4-6).     ASPIRIN  81 PO Take 81 mg by mouth daily.     atorvastatin  (LIPITOR) 40 MG tablet Take 1 tablet (40 mg total) by mouth every evening. 30 tablet 11   carvedilol  (COREG ) 12.5 MG  tablet TAKE ONE TABLET BY MOUTH TWICE A DAY 180 tablet 3   dapagliflozin  propanediol (FARXIGA ) 10 MG TABS tablet Take 1 tablet (10 mg total) by mouth daily before breakfast. 90 tablet 1   finasteride  (PROSCAR ) 5 MG tablet Take 5 mg by mouth daily.     fluticasone  (FLONASE) 50 MCG/ACT nasal spray Place 1 spray into both nostrils daily as needed for allergies or rhinitis.     gabapentin  (NEURONTIN ) 300 MG capsule Take 300 mg by mouth 2 (two) times daily.     glipiZIDE  (GLUCOTROL  XL) 5 MG 24 hr tablet Take 5 mg by mouth daily.     Iron Combinations (IRON COMPLEX PO) Take 1 tablet by mouth daily.     Loratadine (ALLERGY RELIEF 24-HR PO) Take 1 tablet by mouth daily.     metolazone  (ZAROXOLYN ) 2.5 MG tablet Take 1 tablet (2.5 mg total) by mouth once a week. 30 tablet 1   montelukast  (SINGULAIR ) 10 MG tablet Take 10 mg by mouth daily.      Multiple Vitamin (MULTIVITAMIN) tablet Take 1 tablet by mouth daily.     pantoprazole  (PROTONIX ) 40 MG tablet Take 1  tablet (40 mg total) by mouth daily. 30 tablet 1   potassium chloride  SA (KLOR-CON  M20) 20 MEQ tablet Take 2 tablets (40 mEq total) by mouth daily. 180 tablet 3   spironolactone  (ALDACTONE ) 25 MG tablet Take 1 tablet (25 mg total) by mouth daily. 90 tablet 3   SYMBICORT 80-4.5 MCG/ACT inhaler Inhale 2 puffs into the lungs daily as needed (wheezing).     tamsulosin  (FLOMAX ) 0.4 MG CAPS capsule Take 0.4 mg by mouth daily.     torsemide  (DEMADEX ) 100 MG tablet Take 1 tablet (100 mg total) by mouth 2 (two) times daily. 180 tablet 3   VENTOLIN  HFA 108 (90 Base) MCG/ACT inhaler Inhale 2 puffs into the lungs every 6 (six) hours as needed for wheezing or shortness of breath.      No current facility-administered medications for this encounter.   BP 118/62   Pulse 69   Ht 5' 7 (1.702 m)   Wt 85.5 kg (188 lb 9.6 oz)   SpO2 96%   BMI 29.54 kg/m    Wt Readings from Last 3 Encounters:  01/27/24 85.5 kg (188 lb 9.6 oz)  12/13/23 90.2 kg (198 lb 12.8 oz)  11/12/23 108.9 kg (240 lb)   Physical Exam: General:  NAD. No resp difficulty, walked into clinic with RW HEENT: Normal Neck: Supple. No JVD. Cor: Regular rate & rhythm. No rubs, gallops, 2/6 SEM RUSB Lungs: Clear Abdomen: Soft, nontender, nondistended.  Extremities: No cyanosis, clubbing, rash, trace BLE edema R>L Neuro: Alert & oriented x 3, moves all 4 extremities w/o difficulty. Affect pleasant.   Assessment/Plan: 1. Chronic systolic CHF:  Nonischemic cardiomyopathy.  Echo 4/20 with EF 45-50%.  Cath in 9/12 showed nonobstructive coronary disease.  S/p Cardiomems implant 11/20. Echo (6/21) with EF 45-50%, grade I DD, RV normal, mild to moderate TR. Echo (3/24) showed EF 35-40% with paradoxical septal motion. Cardiac MRI in 7/24 showed mild LV dilation with EF 38%, septal-lateral dyssynchrony, RV EF 47%, nonspecific inferior RV insertion site LGE (not suggestive of coronary disease), moderate MR. Mod AI.  Echo 6/25 (On Dr. Orvilla read)  showed EF 35% with septal-lateral dyssynchrony consistent with LBBB, normal RV size and systolic function, mild MR, functionally bicuspid aortic valve with mild AS and moderate aortic insufficiency, ascending aorta 4.8 cm. NYHA class II-III chronically. He  is not volume overloaded on exam, Cardiomems stable. - Continue torsemide  100 mg bid + metolazone  once weekly, on Sundays.  Labs from 01/17/24 showed K 4.0, SCr 2.27 - Continue spironolactone   25 mg daily.    - Continue Coreg  12.5 mg bid.  - Continue Farxiga  10 mg daily.  - Continue to follow Cardiomems.  - Patient has a wide LBBB with dyssynchrony, echo shows borderline LVEF for CRT (35% on Dr. Orvilla read).  We are repeating a cardiac MRI to more exactly quantify LV EF; this has been arranged on 02/12/24. If < 35%, will refer to EP to consider CRT-D.  - He needs to bring all his meds in with him to appointments, seems to have some confusion about what he is actually taking.     2. [name]: Stage IV. Last SCr 2.27 3. OSA: Having difficulty tolerating CPAP. He is asking about Inspire device. - Refer back to Dr. Shlomo to discuss options.  4. Lower extremity ulcerations: Possibly venous stasis. Followed by podiatry. 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  5. Ascending aortic aneurysm: CT chest 6/20 with 5.1 cm ascending aortic aneurysm.  CT chest w/o contrast 12/23 with 4.9 cm ascending aorta.  - Will get MRA chest to reassess ascending aorta with cardiac MRI.  6. Hyperlipidemia: Goal LDL < 70 with diabetes.  - Continue atorvastatin . 7. Diabetes Type 2:  - Management per PCP.  Follow up in 3 months with Dr. Rolan.  Harlene HERO [facility] FNP-BC 01/27/24

## 2024-01-24 NOTE — Telephone Encounter (Signed)
 Called and spoke to pt's wife Consuelo to confirm/remind patient of their appointment at the [facility] on 01/27/24.   Appointment:   [x] Confirmed  [] Left mess   [] No answer/No voice mail  [] VM Full/unable to leave message  [] Phone not in service  Patient reminded to bring all medications and/or complete list.  Confirmed patient has transportation. Gave directions, instructed to utilize valet parking.

## 2024-01-27 ENCOUNTER — Ambulatory Visit (HOSPITAL_COMMUNITY)
Admission: RE | Admit: 2024-01-27 | Discharge: 2024-01-27 | Disposition: A | Discharge status: Home / Self Care | Source: Ambulatory Visit | Attending: Family Medicine

## 2024-01-27 ENCOUNTER — Encounter (HOSPITAL_COMMUNITY): Payer: Self-pay

## 2024-01-27 VITALS — BP 118/62 | HR 69 | Ht 67.0 in | Wt 188.6 lb

## 2024-01-27 DIAGNOSIS — I7121 Aneurysm of the ascending aorta, without rupture: Secondary | ICD-10-CM | POA: Insufficient documentation

## 2024-01-27 DIAGNOSIS — G4733 Obstructive sleep apnea (adult) (pediatric): Secondary | ICD-10-CM | POA: Insufficient documentation

## 2024-01-27 DIAGNOSIS — Z79899 Other long term (current) drug therapy: Secondary | ICD-10-CM | POA: Insufficient documentation

## 2024-01-27 DIAGNOSIS — L97909 Non-pressure chronic ulcer of unspecified part of unspecified lower leg with unspecified severity: Secondary | ICD-10-CM | POA: Insufficient documentation

## 2024-01-27 DIAGNOSIS — I361 Nonrheumatic tricuspid (valve) insufficiency: Secondary | ICD-10-CM | POA: Diagnosis not present

## 2024-01-27 DIAGNOSIS — I872 Venous insufficiency (chronic) (peripheral): Secondary | ICD-10-CM | POA: Diagnosis not present

## 2024-01-27 DIAGNOSIS — N183 Chronic kidney disease, stage 3 unspecified: Secondary | ICD-10-CM | POA: Diagnosis not present

## 2024-01-27 DIAGNOSIS — N184 Chronic kidney disease, stage 4 (severe): Secondary | ICD-10-CM | POA: Diagnosis not present

## 2024-01-27 DIAGNOSIS — E1122 Type 2 diabetes mellitus with diabetic chronic kidney disease: Secondary | ICD-10-CM | POA: Insufficient documentation

## 2024-01-27 DIAGNOSIS — I5022 Chronic systolic (congestive) heart failure: Secondary | ICD-10-CM | POA: Diagnosis not present

## 2024-01-27 DIAGNOSIS — E1151 Type 2 diabetes mellitus with diabetic peripheral angiopathy without gangrene: Secondary | ICD-10-CM | POA: Diagnosis not present

## 2024-01-27 DIAGNOSIS — I447 Left bundle-branch block, unspecified: Secondary | ICD-10-CM | POA: Insufficient documentation

## 2024-01-27 DIAGNOSIS — E11622 Type 2 diabetes mellitus with other skin ulcer: Secondary | ICD-10-CM | POA: Insufficient documentation

## 2024-01-27 DIAGNOSIS — E785 Hyperlipidemia, unspecified: Secondary | ICD-10-CM | POA: Insufficient documentation

## 2024-01-27 DIAGNOSIS — I428 Other cardiomyopathies: Secondary | ICD-10-CM | POA: Diagnosis not present

## 2024-01-27 DIAGNOSIS — E118 Type 2 diabetes mellitus with unspecified complications: Secondary | ICD-10-CM

## 2024-01-27 DIAGNOSIS — Z7984 Long term (current) use of oral hypoglycemic drugs: Secondary | ICD-10-CM | POA: Insufficient documentation

## 2024-01-27 NOTE — Patient Instructions (Addendum)
 Good to see you today!   You have been referred to Dr Dorine office they will call to schedule an appointment  Your physician recommends that you schedule a follow-up appointment 3 months(October) Call office in August to schedule an appointment  If you have any questions or concerns before your next appointment please send us  a message through [facility] or call our office at [phone].    TO LEAVE A MESSAGE FOR THE NURSE SELECT OPTION 2, PLEASE LEAVE A MESSAGE INCLUDING: YOUR NAME DATE OF BIRTH CALL BACK NUMBER REASON FOR CALL**this is important as we prioritize the call backs  YOU WILL RECEIVE A CALL BACK THE SAME DAY AS LONG AS YOU CALL BEFORE 4:00 PM At the [facility], you and your health needs are our priority. As part of our continuing mission to provide you with exceptional heart care, we have created designated Provider Care Teams. These Care Teams include your primary Cardiologist (physician) and Advanced Practice Providers (APPs- Physician Assistants and Nurse Practitioners) who all work together to provide you with the care you need, when you need it.   You may see any of the following providers on your designated Care Team at your next follow up: Dr Toribio Fuel Dr Ezra Shuck Dr. Ria Commander Dr. Morene Brownie Amy Lenetta, NP Caffie Shed, [place] [facility] [place], [place] Beckey Coe, NP [place] Lee, NP Ellouise Class, NP Tinnie Redman, PharmD Jaun Bash, PharmD   Please be sure to bring in all your medications bottles to every appointment.    Thank you for choosing [facility]

## 2024-02-12 ENCOUNTER — Other Ambulatory Visit (HOSPITAL_COMMUNITY): Payer: Self-pay | Admitting: Cardiology

## 2024-02-12 ENCOUNTER — Ambulatory Visit (HOSPITAL_COMMUNITY)
Admission: RE | Admit: 2024-02-12 | Discharge: 2024-02-12 | Disposition: A | Discharge status: Home / Self Care | Source: Ambulatory Visit | Attending: Cardiology | Admitting: Cardiology

## 2024-02-12 DIAGNOSIS — I7123 Aneurysm of the descending thoracic aorta, without rupture: Secondary | ICD-10-CM | POA: Insufficient documentation

## 2024-02-12 DIAGNOSIS — I5022 Chronic systolic (congestive) heart failure: Secondary | ICD-10-CM | POA: Insufficient documentation

## 2024-02-12 MED ORDER — GADOBUTROL 1 MMOL/ML IV SOLN
10.0000 mL | Freq: Once | INTRAVENOUS | Status: AC | PRN
  Administered 2024-02-12 (×2): 10 mL via INTRAVENOUS

## 2024-02-14 ENCOUNTER — Ambulatory Visit (HOSPITAL_COMMUNITY): Payer: Self-pay | Admitting: Cardiology

## 2024-02-25 ENCOUNTER — Telehealth (HOSPITAL_COMMUNITY): Payer: Self-pay | Admitting: Cardiology

## 2024-02-25 DIAGNOSIS — I5022 Chronic systolic (congestive) heart failure: Secondary | ICD-10-CM

## 2024-02-25 NOTE — Telephone Encounter (Signed)
 I do not actually have Entresto on his med list from his last 2 visits with us .  Who started it? He should stay off Entresto.  Make sure he has appt with EP for CRT evaluation.

## 2024-02-25 NOTE — Telephone Encounter (Signed)
 Patient called to report since starting entresto he has felt like he will pass out. Reports he stopped medication and spells has stopped  During episodes reports he felt weak, felt like he would hit the floor, reports seeing spots  Reports he takes his b/p daily and readings have not changed, no readings during episodes  Wanted to notify provider

## 2024-02-26 ENCOUNTER — Ambulatory Visit: Admitting: Podiatry

## 2024-02-26 ENCOUNTER — Encounter: Payer: Self-pay | Admitting: Podiatry

## 2024-02-26 DIAGNOSIS — E119 Type 2 diabetes mellitus without complications: Secondary | ICD-10-CM | POA: Diagnosis not present

## 2024-02-26 DIAGNOSIS — B351 Tinea unguium: Secondary | ICD-10-CM

## 2024-02-26 DIAGNOSIS — M79674 Pain in right toe(s): Secondary | ICD-10-CM

## 2024-02-26 DIAGNOSIS — M79675 Pain in left toe(s): Secondary | ICD-10-CM

## 2024-02-26 NOTE — Progress Notes (Signed)
 This patient returns to my office for at risk foot care.  This patient requires this care by a professional since this patient will be at risk due to having diabetes type 2 and chronic venous insufficiency.  This patient is unable to cut nails himself since the patient cannot reach his nails.These nails are painful walking and wearing shoes.  This patient presents for at risk foot care today.  General Appearance  Alert, conversant and in no acute stress.  Vascular  Dorsalis pedis and posterior tibial  pulses are not  palpable  bilaterally.  Capillary return is within normal limits  bilaterally. Temperature is within normal limits  bilaterally.  Neurologic  Senn-Weinstein monofilament wire test diminished/absent  bilaterally. Muscle power within normal limits bilaterally.  Nails Thick disfigured discolored nails with subungual debris  from hallux to fifth toes bilaterally. No evidence of bacterial infection or drainage bilaterally.  Orthopedic  No limitations of motion  feet .  No crepitus or effusions noted.  No bony pathology or digital deformities noted.  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  Midfoot  DJD  B/L.  Limited rearfoot  ROM  B/L.  Skin  normotropic skin with no porokeratosis noted bilaterally.  No signs of infections or ulcers noted.     Onychomycosis  Pain in right toes  Pain in left toes  Consent was obtained for treatment procedures.   Mechanical debridement of nails 1-5  bilaterally performed with a nail nipper.  Filed with dremel without incident.    Return office visit    3 months                  Told patient to return for periodic foot care and evaluation due to potential at risk complications.   Helane Gunther DPM

## 2024-02-26 NOTE — Telephone Encounter (Signed)
Attempted to call patient, left message for patient to call back to office.   

## 2024-03-11 NOTE — Telephone Encounter (Signed)
 Patient aware via wife   EP referral placed

## 2024-04-06 ENCOUNTER — Encounter (HOSPITAL_COMMUNITY): Payer: Self-pay | Admitting: Cardiology

## 2024-04-06 ENCOUNTER — Ambulatory Visit (HOSPITAL_COMMUNITY)
Admission: RE | Admit: 2024-04-06 | Discharge: 2024-04-06 | Disposition: A | Discharge status: Home / Self Care | Source: Ambulatory Visit | Attending: Cardiology | Admitting: Cardiology

## 2024-04-06 ENCOUNTER — Ambulatory Visit (HOSPITAL_COMMUNITY): Payer: Self-pay | Admitting: Cardiology

## 2024-04-06 VITALS — BP 118/70 | HR 60 | Wt 192.4 lb

## 2024-04-06 DIAGNOSIS — I5022 Chronic systolic (congestive) heart failure: Secondary | ICD-10-CM

## 2024-04-06 DIAGNOSIS — Q231 Congenital insufficiency of aortic valve: Secondary | ICD-10-CM | POA: Insufficient documentation

## 2024-04-06 DIAGNOSIS — G4733 Obstructive sleep apnea (adult) (pediatric): Secondary | ICD-10-CM | POA: Diagnosis not present

## 2024-04-06 DIAGNOSIS — I130 Hypertensive heart and chronic kidney disease with heart failure and stage 1 through stage 4 chronic kidney disease, or unspecified chronic kidney disease: Secondary | ICD-10-CM | POA: Diagnosis not present

## 2024-04-06 DIAGNOSIS — Z79899 Other long term (current) drug therapy: Secondary | ICD-10-CM | POA: Diagnosis not present

## 2024-04-06 DIAGNOSIS — R7989 Other specified abnormal findings of blood chemistry: Secondary | ICD-10-CM | POA: Insufficient documentation

## 2024-04-06 DIAGNOSIS — I447 Left bundle-branch block, unspecified: Secondary | ICD-10-CM | POA: Diagnosis not present

## 2024-04-06 DIAGNOSIS — Z8674 Personal history of sudden cardiac arrest: Secondary | ICD-10-CM | POA: Insufficient documentation

## 2024-04-06 DIAGNOSIS — Q2381 Bicuspid aortic valve: Secondary | ICD-10-CM | POA: Diagnosis not present

## 2024-04-06 DIAGNOSIS — N184 Chronic kidney disease, stage 4 (severe): Secondary | ICD-10-CM | POA: Diagnosis not present

## 2024-04-06 DIAGNOSIS — E785 Hyperlipidemia, unspecified: Secondary | ICD-10-CM | POA: Insufficient documentation

## 2024-04-06 DIAGNOSIS — E1122 Type 2 diabetes mellitus with diabetic chronic kidney disease: Secondary | ICD-10-CM | POA: Insufficient documentation

## 2024-04-06 DIAGNOSIS — I428 Other cardiomyopathies: Secondary | ICD-10-CM | POA: Insufficient documentation

## 2024-04-06 DIAGNOSIS — Z7984 Long term (current) use of oral hypoglycemic drugs: Secondary | ICD-10-CM | POA: Insufficient documentation

## 2024-04-06 LAB — LIPID PANEL
Cholesterol: 132 mg/dL (ref 0–200)
HDL: 34 mg/dL — ABNORMAL LOW (ref >40)
LDL Cholesterol: 73 mg/dL (ref 0–99)
Total CHOL/HDL Ratio: 3.9 ratio
Triglycerides: 123 mg/dL (ref <150)
VLDL: 25 mg/dL (ref 0–40)

## 2024-04-06 LAB — BASIC METABOLIC PANEL WITH GFR
Anion gap: 18 — ABNORMAL HIGH (ref 5–15)
BUN: 75 mg/dL — ABNORMAL HIGH (ref 8–23)
CO2: 28 mmol/L (ref 22–32)
Calcium: 8.5 mg/dL — ABNORMAL LOW (ref 8.9–10.3)
Chloride: 91 mmol/L — ABNORMAL LOW (ref 98–111)
Creatinine, Ser: 2.53 mg/dL — ABNORMAL HIGH (ref 0.61–1.24)
GFR, Estimated: 26 mL/min — ABNORMAL LOW (ref >60)
Glucose, Bld: 123 mg/dL — ABNORMAL HIGH (ref 70–99)
Potassium: 4 mmol/L (ref 3.5–5.1)
Sodium: 137 mmol/L (ref 135–145)

## 2024-04-06 LAB — BRAIN NATRIURETIC PEPTIDE: B Natriuretic Peptide: 203.4 pg/mL — ABNORMAL HIGH (ref 0.0–100.0)

## 2024-04-06 NOTE — Patient Instructions (Addendum)
 TOMORROW TAKE AN EXTRA DOSE OF METOLAZONE  2.5 MG WITH AN EXTRA POTASSIUM TABLET.  Labs done today, your results will be available in MyChart, we will contact you for abnormal readings.  Your physician recommends that you schedule a follow-up appointment in: 2 months.  If you have any questions or concerns before your next appointment please send us  a message through [place] or call our office at [phone].    TO LEAVE A MESSAGE FOR THE NURSE SELECT OPTION 2, PLEASE LEAVE A MESSAGE INCLUDING: YOUR NAME DATE OF BIRTH CALL BACK NUMBER REASON FOR CALL**this is important as we prioritize the call backs  YOU WILL RECEIVE A CALL BACK THE SAME DAY AS LONG AS YOU CALL BEFORE 4:00 PM  At the [facility], you and your health needs are our priority. As part of our continuing mission to provide you with exceptional heart care, we have created designated Provider Care Teams. These Care Teams include your primary Cardiologist (physician) and Advanced Practice Providers (APPs- Physician Assistants and Nurse Practitioners) who all work together to provide you with the care you need, when you need it.   You may see any of the following providers on your designated Care Team at your next follow up: Dr Toribio Fuel Dr Ezra Shuck Dr. Ria Commander Dr. Morene Brownie Amy Lenetta, NP Caffie Shed, [place] [facility] [place], [place] Beckey Coe, NP [place] Lee, NP Ellouise Class, NP Tinnie Redman, PharmD Jaun Bash, PharmD   Please be sure to bring in all your medications bottles to every appointment.    Thank you for choosing [facility]

## 2024-04-07 NOTE — Progress Notes (Signed)
 Advanced Heart Failure Clinic Note  PCP: Leontine Cramp, NP HF Cardiology: Dr. Rolan  Chief complaint: CHF  73 y.o. with history of CHF, [name], [name] for followup of CHF. He has a history of mild LV systolic dysfunction (EF 45-50% range) with probably significant RV failure. Patient was admitted in 4/20 with hypoxic respiratory failure due to PNA and decompensated HF. Hospitalization was complicated by PEA arrest and AKI.   In 6/20, he was again admitted with hypoxemic respiratory failure with hypertensive crisis, CHF, and PNA.    Due to frequent hospital admission for CHF, underwent Cardiomems implantation 11/20.  RHC at that time showed optimized filling pressures after adjustment of torsemide . Outpatient sleep study found to have severe OSA and is now using BiPAP. He was also referred to HF paramedicine program.   Echo 6/21 showed EF 45-50%, grade I DD, RV normal, mild to moderate TR.   Last seen 01/2020. Discharged from paramedicine 9/21 after demonstrating competency managing his medications. Unfortunately lost to follow up but has been using Cardiomems regularly.  Follow up 10/23, we had not seen him since 01/2020. NYHA II-early III symptoms and volume overloaded. Torsemide  increased to 80 bid and metolazone  2.5 mg added weekly.  Echo 3/24 showed EF 35-40% with paradoxical septal motion, consistent with LBBB, grade I DD, normal RV, mild to moderate AI, AoRoot dilation 41 mm, ascending aorta 45 mm.   cMRI 7/24 showed mild LV dilation with EF 38%, septal-lateral dyssynchrony, RV EF 47%, nonspecific inferior RV insertion site LGE, moderate MR.   RHC in 5/25 showed mildly elevated filling pressures with preserved cardiac output.  Farxiga  was restarted.   Echo 6/25 showed EF around 35% by my read with septal-lateral dyssynchrony consistent with LBBB, normal RV size and systolic function, mild MR, functionally bicuspid aortic valve with mild AS and moderate aortic  insufficiency, ascending aorta 4.8 cm.   Cardiac MRI in 8/25 showed mild LV dilation with LV EF 34%, septal-lateral dyssynchrony, RV EF 40%, functionally bicuspid aortic valve with probably mild AS and mild AI, mid-wall LGE basal to mid inferoseptum at the RV insertion site, ascending aorta 4.5 cm.   Today he returns for HF follow up with his wife. Weight 4 lbs.  Was wearing CPAP but mask broke.  Has become gradually more tired during the day recently.  Dyspnea walking longer distances, does ok walking around the house.  Tries to go to the mailbox and back every day, tired and short of breath when he gets back.  No chest pain.  No orthopnea/PND.  No lightheadedness or falls.   ECG (personally reviewed): NSR, PACs, LBBB 162 msec  Cardiomems (personally reviewed): Today's reading 22, goal is 21   Labs (12/24): LDL 70 Labs (5/25): K 3.5, creatinine 2.23 Labs (7/25): K 4.0, creatinine 2.27  PMH: 1. HTN 2. [name] stage 3 3. Type 2 diabetes 4. Hyperlipidemia 5. COPD: Suspected 6. PNA: Recurrent, suspected aspiration.  7. PEA arrest 4/20: Respiratory arrest.  8. LBBB 9. TIA 10. Ascending aortic aneurysm: CT chest in 6/20 with 5.1 cm ascending aortic aneurysm. - CT chest w/o contrast (12/23): 4.9 cm ascending aorta.  - Echo (6/25): 4.8 cm ascending aorta.  - MRA chest (8/25): 4.5 cm ascending aorta.  11. 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  - Echo (12/19): EF 45-50%.  - Echo (4/20): EF 45-50%, diffuse hypokinesis, normal RV.  - Cardiomems placement (11/20). - RHC (11/20): mean RA 2, PA 33/8,  mean PCWP 11, CI 3.32 - Echo (6/21): EF 45-50%, grade I DD, RV normal, mild to moderate TR - Echo (3/24): EF 35-40%, evidence of LBBB - cMRI (7/24): showed mild LV dilation with EF 38%, septal-lateral dyssynchrony, RV EF 47%, nonspecific inferior RV insertion site LGE, moderate MR.  - RHC (5/25): mean RA 9, PA 40/20 mean 27, mean PCWP 19, CI 2.67 F/3.03 T, PAPi 2.2, PVR  1.4 WU - Echo (6/25): EF 35% with septal-lateral dyssynchrony consistent with LBBB, normal RV size and systolic function, mild MR, functionally bicuspid aortic valve with mild AS and moderate aortic insufficiency, ascending aorta 4.8 cm.  - Cardiac MRI (8/25): Mild LV dilation with LV EF 34%, septal-lateral dyssynchrony, RV EF 40%, functionally bicuspid aortic valve with probably mild AS and mild AI, mid-wall LGE basal to mid inferoseptum at the RV insertion site, ascending aorta 4.5 cm.  12. Ventricular ectopy: 8/20 monitor showed PVCs and NSVT.  13. Peripheral arterial dopplers (10/20): No significant stenosis.   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 OSA: Bipap  Social History   Socioeconomic History   Marital status: Married    Spouse name: Not on file   Number of children: Not on file   Years of education: Not on file   Highest education level: Not on file  Occupational History   Not on file  Tobacco Use   Smoking status: Never   Smokeless tobacco: Never  Vaping Use   Vaping status: Never Used  Substance and Sexual Activity   Alcohol  use: No   Drug use: No   Sexual activity: Not Currently    Partners: Female    Birth control/protection: None  Other Topics Concern   Not on file  Social History Narrative   Patient lives in private residence with supportive spouse   Social Drivers of Health   Financial Resource Strain: Low Risk  (10/15/2017)   Overall Financial Resource Strain (CARDIA)    Difficulty of Paying Living Expenses: Not very hard  Food Insecurity: No Food Insecurity (10/15/2017)   Hunger Vital Sign    Worried About Running Out of Food in the Last Year: Never true    Ran Out of Food in the Last Year: Never true  Transportation Needs: Unknown (10/15/2017)   PRAPARE - Transportation    Lack of Transportation (Medical): Patient declined    Lack of Transportation (Non-Medical): Patient declined  Physical Activity: Inactive (10/15/2017)   Exercise Vital  Sign    Days of Exercise per Week: 0 days    Minutes of Exercise per Session: 0 min  Stress: No Stress Concern Present (10/15/2017)   [employer] of Occupational Health - Occupational Stress Questionnaire    Feeling of Stress : Not at all  Social Connections: Unknown (10/15/2017)   Social Connection and Isolation Panel    Frequency of Communication with Friends and Family: Patient declined    Frequency of Social Gatherings with Friends and Family: Patient declined    Attends Religious Services: Patient declined    Database administrator or Organizations: Patient declined    Attends Banker Meetings: Patient declined    Marital Status: Patient declined  Intimate Partner Violence: Not At Risk (10/15/2017)   Humiliation, Afraid, Rape, and Kick questionnaire    Fear of Current or Ex-Partner: No    Emotionally Abused: No    Physically Abused: No    Sexually Abused: No   Family History  Problem Relation Age of Onset  Emphysema Mother    Aneurysm Mother    Emphysema Father    Coronary artery disease Brother    Coronary artery disease Brother    Coronary artery disease Sister    ROS: All systems reviewed and negative except as per HPI.   Current Outpatient Medications  Medication Sig Dispense Refill   acetaminophen  (TYLENOL ) 500 MG tablet Take 500-1,000 mg by mouth every 6 (six) hours as needed for moderate pain (pain score 4-6).     ASPIRIN  81 PO Take 81 mg by mouth daily.     atorvastatin  (LIPITOR) 40 MG tablet Take 1 tablet (40 mg total) by mouth every evening. 30 tablet 11   carvedilol  (COREG ) 12.5 MG tablet TAKE ONE TABLET BY MOUTH TWICE A DAY 180 tablet 3   dapagliflozin  propanediol (FARXIGA ) 10 MG TABS tablet Take 1 tablet (10 mg total) by mouth daily before breakfast. 90 tablet 1   finasteride  (PROSCAR ) 5 MG tablet Take 5 mg by mouth daily.     fluticasone  (FLONASE) 50 MCG/ACT nasal spray Place 1 spray into both nostrils daily as needed for allergies or  rhinitis.     gabapentin  (NEURONTIN ) 300 MG capsule Take 300 mg by mouth 2 (two) times daily.     glipiZIDE  (GLUCOTROL  XL) 5 MG 24 hr tablet Take 5 mg by mouth daily.     Iron Combinations (IRON COMPLEX PO) Take 1 tablet by mouth daily.     Loratadine (ALLERGY RELIEF 24-HR PO) Take 1 tablet by mouth daily.     metolazone  (ZAROXOLYN ) 2.5 MG tablet Take 1 tablet (2.5 mg total) by mouth once a week. 30 tablet 1   montelukast  (SINGULAIR ) 10 MG tablet Take 10 mg by mouth daily.      Multiple Vitamin (MULTIVITAMIN) tablet Take 1 tablet by mouth daily.     pantoprazole  (PROTONIX ) 40 MG tablet Take 1 tablet (40 mg total) by mouth daily. 30 tablet 1   potassium chloride  SA (KLOR-CON  M20) 20 MEQ tablet Take 2 tablets (40 mEq total) by mouth daily. 180 tablet 3   spironolactone  (ALDACTONE ) 25 MG tablet Take 1 tablet (25 mg total) by mouth daily. 90 tablet 3   SYMBICORT 80-4.5 MCG/ACT inhaler Inhale 2 puffs into the lungs daily as needed (wheezing).     tamsulosin  (FLOMAX ) 0.4 MG CAPS capsule Take 0.4 mg by mouth daily.     torsemide  (DEMADEX ) 100 MG tablet Take 1 tablet (100 mg total) by mouth 2 (two) times daily. 180 tablet 3   VENTOLIN  HFA 108 (90 Base) MCG/ACT inhaler Inhale 2 puffs into the lungs every 6 (six) hours as needed for wheezing or shortness of breath.      No current facility-administered medications for this encounter.   BP 118/70   Pulse 60   Wt 87.3 kg (192 lb 6.4 oz)   SpO2 99%   BMI 30.13 kg/m    Wt Readings from Last 3 Encounters:  04/06/24 87.3 kg (192 lb 6.4 oz)  01/27/24 85.5 kg (188 lb 9.6 oz)  12/13/23 90.2 kg (198 lb 12.8 oz)   Physical Exam: General: NAD Neck: No JVD, no thyromegaly or thyroid nodule.  Lungs: Clear to auscultation bilaterally with normal respiratory effort. CV: Nondisplaced PMI.  Heart regular S1/S2, no S3/S4, 2/6 SEM RUSB.  No peripheral edema.  No carotid bruit.  Difficult to palpate pedal pulses.  Abdomen: Soft, nontender, no hepatosplenomegaly,  no distention.  Skin: Intact without lesions or rashes.  Neurologic: Alert and oriented x 3.  Psych: Normal affect. Extremities: No clubbing or cyanosis.  HEENT: Normal.    Assessment/Plan: 1. Chronic systolic CHF:  Nonischemic cardiomyopathy.  Echo 4/20 with EF 45-50%.  Cath in 9/12 showed nonobstructive coronary disease.  S/p Cardiomems implant 11/20. Echo (6/21) with EF 45-50%, grade I DD, RV normal, mild to moderate TR. Echo (3/24) showed EF 35-40% with paradoxical septal motion. Cardiac MRI in 7/24 showed mild LV dilation with EF 38%, septal-lateral dyssynchrony, RV EF 47%, nonspecific inferior RV insertion site LGE (not suggestive of coronary disease), moderate MR. Mod AI.  Echo 6/25 by my read showed EF 35% with septal-lateral dyssynchrony consistent with LBBB, normal RV size and systolic function, mild MR, functionally bicuspid aortic valve with mild AS and moderate aortic insufficiency, ascending aorta 4.8 cm.  Cardiac MRI was done in 8/25 to quantify EF, this showed mild LV dilation with LV EF 34%, septal-lateral dyssynchrony, RV EF 40%, functionally bicuspid aortic valve with probably mild AS and mild AI, mid-wall LGE basal to mid inferoseptum at the RV insertion site, ascending aorta 4.5 cm.  NYHA class III, mild volume overload based on Cardiomems.   - Continue torsemide  100 mg bid + metolazone  once weekly, on Sundays. I will have him take an extra metolazone  this week (Tuesday morning) given mildly elevated PADP by Cardiomems today.  BMET/BNP today.  - Continue spironolactone   25 mg daily.    - Continue Coreg  12.5 mg bid.  - Continue Farxiga  10 mg daily.   - No ARB/ARNI yet with elevated creatinine.  - Patient has a wide LBBB with dyssynchrony by imaging, cardiac MRI showed EF < 35%.  Will refer to EP to consider CRT-D.  2. [name]: Stage IV. Last SCr 2.27 - BMET today.  3. OSA: CPAP mask is broken, he will call to get it repaired.  4. Ascending aortic aneurysm: MRA in 8/25 showed stable  4.5 cm ascending aorta.  5. Hyperlipidemia: Goal LDL < 70 with diabetes.  - Continue atorvastatin . 6. Diabetes Type 2:  - Management per PCP.  Follow up in 2 months with APP.  I spent 31 minutes reviewing records, interviewing/examining patient, and managing orders.   Ezra Shuck  04/07/24

## 2024-04-23 ENCOUNTER — Ambulatory Visit: Attending: Internal Medicine | Admitting: Internal Medicine

## 2024-04-23 VITALS — BP 106/62 | HR 64 | Ht 67.0 in | Wt 190.0 lb

## 2024-04-23 DIAGNOSIS — I5022 Chronic systolic (congestive) heart failure: Secondary | ICD-10-CM | POA: Diagnosis not present

## 2024-04-23 NOTE — Patient Instructions (Addendum)
 Medication Instructions:  Your physician recommends that you continue on your current medications as directed. Please refer to the Current Medication list given to you today.  *If you need a refill on your cardiac medications before your next appointment, please call your pharmacy*  Lab Work: None ordered.  You may go to any [facility] Location for your lab work:  [facility] - [address] ([facility]) - [phone]  [place] - [address]   [place]  - [address]   [place] - [address] - [address] [employer]  - [address] - [address] ([facility]   If you have labs (blood work) drawn today and your tests are completely normal, you will receive your results only by: [employer] (if you have MyChart)  If you have any lab test that is abnormal or we need to change your treatment, we will call you or send a MyChart message to review the results.  Testing/Procedures: None ordered.  Follow-Up: At [facility], you and your health needs are our priority.  As part of our continuing mission to provide you with exceptional heart care, we have created designated Provider Care Teams.  These Care Teams include your primary Cardiologist (physician) and Advanced Practice Providers (APPs -  Physician Assistants and Nurse Practitioners) who all work together to provide you with the care you need, when you need it.   Your next appointment:   Call us  at [phone] if you would like to schedule your procedure,  The format for your next appointment:   In Person  Provider:   Donnice Primus, MD or one of the following Advanced Practice Providers on your designated Care Team:   Charlies Arthur, [place] Ozell Jodie Passey, [place] Leotis Barrack, NP  Note: Remote monitoring is used to monitor your Pacemaker/ ICD from home. This  monitoring reduces the number of office visits required to check your device to one time per year. It allows us  to keep an eye on the functioning of your device to ensure it is working properly.

## 2024-04-23 NOTE — Progress Notes (Signed)
 HPI Mr. Kohlmeyer is referred by Dr. Rolan to consider ICD insertion. He is a pleasant 73 yo man with LBBB and chronic systolic heart failure EF 25%. He has been on GDMT and has class 2B symptoms. He has a QRS duration of  160 ms. He has not had syncope. He did have a PEA arrest in the past. No anginal symptoms.  Allergies  Allergen Reactions   Cashew Nut Oil Palpitations   Percocet [Oxycodone -Acetaminophen ] Other (See Comments)    Hallucintaions     Current Outpatient Medications  Medication Sig Dispense Refill   acetaminophen  (TYLENOL ) 500 MG tablet Take 500-1,000 mg by mouth every 6 (six) hours as needed for moderate pain (pain score 4-6).     ASPIRIN  81 PO Take 81 mg by mouth daily.     atorvastatin  (LIPITOR) 40 MG tablet Take 1 tablet (40 mg total) by mouth every evening. 30 tablet 11   carvedilol  (COREG ) 12.5 MG tablet TAKE ONE TABLET BY MOUTH TWICE A DAY 180 tablet 3   dapagliflozin  propanediol (FARXIGA ) 10 MG TABS tablet Take 1 tablet (10 mg total) by mouth daily before breakfast. 90 tablet 1   finasteride  (PROSCAR ) 5 MG tablet Take 5 mg by mouth daily.     fluticasone  (FLONASE) 50 MCG/ACT nasal spray Place 1 spray into both nostrils daily as needed for allergies or rhinitis.     gabapentin  (NEURONTIN ) 300 MG capsule Take 300 mg by mouth 2 (two) times daily.     glipiZIDE  (GLUCOTROL  XL) 5 MG 24 hr tablet Take 5 mg by mouth daily.     Iron Combinations (IRON COMPLEX PO) Take 1 tablet by mouth daily.     Loratadine (ALLERGY RELIEF 24-HR PO) Take 1 tablet by mouth daily.     metolazone  (ZAROXOLYN ) 2.5 MG tablet Take 1 tablet (2.5 mg total) by mouth once a week. 30 tablet 1   montelukast  (SINGULAIR ) 10 MG tablet Take 10 mg by mouth daily.      Multiple Vitamin (MULTIVITAMIN) tablet Take 1 tablet by mouth daily.     pantoprazole  (PROTONIX ) 40 MG tablet Take 1 tablet (40 mg total) by mouth daily. 30 tablet 1   potassium chloride  SA (KLOR-CON  M20) 20 MEQ tablet Take 2 tablets (40  mEq total) by mouth daily. 180 tablet 3   spironolactone  (ALDACTONE ) 25 MG tablet Take 1 tablet (25 mg total) by mouth daily. 90 tablet 3   SYMBICORT 80-4.5 MCG/ACT inhaler Inhale 2 puffs into the lungs daily as needed (wheezing).     tamsulosin  (FLOMAX ) 0.4 MG CAPS capsule Take 0.4 mg by mouth daily.     torsemide  (DEMADEX ) 100 MG tablet Take 1 tablet (100 mg total) by mouth 2 (two) times daily. 180 tablet 3   VENTOLIN  HFA 108 (90 Base) MCG/ACT inhaler Inhale 2 puffs into the lungs every 6 (six) hours as needed for wheezing or shortness of breath.      No current facility-administered medications for this visit.     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   wife states blocked 30%   CHF (congestive heart failure) (HCC)    Chronic kidney disease    Diabetes mellitus 10/17/2011   newly dx today   Edema leg    right leg has leaky valve and right foot swells   Emphysema    Excessive ear wax    Fatty liver    Hernia  near navel   Hyperlipidemia    Hypertension    Leaky heart valve    Neuropathy    Rheumatic fever    Sleep apnea    Slow urinary stream    TIA (transient ischemic attack)     ROS:   All systems reviewed and negative except as noted in the HPI.   Past Surgical History:  Procedure Laterality Date   CIRCUMCISION     LITHOTRIPSY     PRESSURE SENSOR/CARDIOMEMS N/A 05/27/2019   Procedure: PRESSURE SENSOR/CARDIOMEMS;  Surgeon: Rolan Ezra RAMAN, MD;  Location[facility] INVASIVE CV LAB;  Service: Cardiovascular;  Laterality: N/A;   RIGHT HEART CATH N/A 05/27/2019   Procedure: RIGHT HEART CATH;  Surgeon: Rolan Ezra RAMAN, MD;  Location: [facility] INVASIVE CV LAB;  Service: Cardiovascular;  Laterality: N/A;   RIGHT HEART CATH N/A 11/12/2023   Procedure: RIGHT HEART CATH;  Surgeon: Rolan Ezra RAMAN, MD;  Location: [facility] INVASIVE CV LAB;  Service: Cardiovascular;  Laterality: N/A;   TOTAL HIP ARTHROPLASTY Left 06/16/2020    Procedure: TOTAL HIP ARTHROPLASTY ANTERIOR APPROACH;  Surgeon: Fidel Rogue, MD;  Location: [facility] ORS;  Service: Orthopedics;  Laterality: Left;     Family History  Problem Relation Age of Onset   Emphysema Mother    Aneurysm Mother    Emphysema Father    Coronary artery disease Brother    Coronary artery disease Brother    Coronary artery disease Sister      Social History   Socioeconomic History   Marital status: Married    Spouse name: Not on file   Number of children: Not on file   Years of education: Not on file   Highest education level: Not on file  Occupational History   Not on file  Tobacco Use   Smoking status: Never   Smokeless tobacco: Never  Vaping Use   Vaping status: Never Used  Substance and Sexual Activity   Alcohol  use: No   Drug use: No   Sexual activity: Not Currently    Partners: Female    Birth control/protection: None  Other Topics Concern   Not on file  Social History Narrative   Patient lives in private residence with supportive spouse   Social Drivers of Health   Financial Resource Strain: Low Risk  (10/15/2017)   Overall Financial Resource Strain (CARDIA)    Difficulty of Paying Living Expenses: Not very hard  Food Insecurity: No Food Insecurity (10/15/2017)   Hunger Vital Sign    Worried About Running Out of Food in the Last Year: Never true    Ran Out of Food in the Last Year: Never true  Transportation Needs: Unknown (10/15/2017)   PRAPARE - Transportation    Lack of Transportation (Medical): Patient declined    Lack of Transportation (Non-Medical): Patient declined  Physical Activity: Inactive (10/15/2017)   Exercise Vital Sign    Days of Exercise per Week: 0 days    Minutes of Exercise per Session: 0 min  Stress: No Stress Concern Present (10/15/2017)   [employer] of Occupational Health - Occupational Stress Questionnaire    Feeling of Stress : Not at all  Social Connections: Unknown (10/15/2017)   Social Connection and  Isolation Panel    Frequency of Communication with Friends and Family: Patient declined    Frequency of Social Gatherings with Friends and Family: Patient declined    Attends Religious Services: Patient declined    Active Member of Clubs or Organizations: Patient declined  Attends Banker Meetings: Patient declined    Marital Status: Patient declined  Intimate Partner Violence: Not At Risk (10/15/2017)   Humiliation, Afraid, Rape, and Kick questionnaire    Fear of Current or Ex-Partner: No    Emotionally Abused: No    Physically Abused: No    Sexually Abused: No     BP 106/62 (BP Location: Right Arm, Patient Position: Sitting, Cuff Size: Normal)   Pulse 64   Ht 5' 7 (1.702 m)   Wt 190 lb (86.2 kg)   SpO2 94%   BMI 29.76 kg/m   Physical Exam:  Well appearing NAD HEENT: Unremarkable Neck:  No JVD, no thyromegally Lymphatics:  No adenopathy Back:  No CVA tenderness Lungs:  Clear with no wheezes HEART:  Regular rate rhythm, no murmurs, no rubs, no clicks'split S2.  Abd:  soft, positive bowel sounds, no organomegally, no rebound, no guarding Ext:  2 plus pulses, no edema, no cyanosis, no clubbing Skin:  No rashes no nodules Neuro:  CN II through XII intact, motor grossly intact  EKG - reviewed NSR with LBBB  Assess/Plan: Non-ischemic CM with class 2B CHF EF 25% - he is on GDMT. I expect for some improvement with resychronization. I have discussed the indications/risks/benefits/goals/expectations of Biv ICD insertion with the patient and he wishes to proceed. LBBB - his QRS is quite wide. Hopefully this will improve.   Danelle Jerin Franzel,MD

## 2024-05-27 ENCOUNTER — Ambulatory Visit (INDEPENDENT_AMBULATORY_CARE_PROVIDER_SITE_OTHER): Admitting: Podiatry

## 2024-05-27 ENCOUNTER — Encounter: Payer: Self-pay | Admitting: Podiatry

## 2024-05-27 DIAGNOSIS — M79675 Pain in left toe(s): Secondary | ICD-10-CM

## 2024-05-27 DIAGNOSIS — B351 Tinea unguium: Secondary | ICD-10-CM

## 2024-05-27 DIAGNOSIS — E119 Type 2 diabetes mellitus without complications: Secondary | ICD-10-CM

## 2024-05-27 DIAGNOSIS — M79674 Pain in right toe(s): Secondary | ICD-10-CM | POA: Diagnosis not present

## 2024-05-27 NOTE — Progress Notes (Signed)
 This patient returns to my office for at risk foot care.  This patient requires this care by a professional since this patient will be at risk due to having diabetes type 2 and chronic venous insufficiency.  This patient is unable to cut nails himself since the patient cannot reach his nails.These nails are painful walking and wearing shoes.  This patient presents for at risk foot care today.  General Appearance  Alert, conversant and in no acute stress.  Vascular  Dorsalis pedis and posterior tibial  pulses are not  palpable  bilaterally.  Capillary return is within normal limits  bilaterally. Temperature is within normal limits  bilaterally.  Neurologic  Senn-Weinstein monofilament wire test diminished/absent  bilaterally. Muscle power within normal limits bilaterally.  Nails Thick disfigured discolored nails with subungual debris  from hallux to fifth toes bilaterally. No evidence of bacterial infection or drainage bilaterally.  Orthopedic  No limitations of motion  feet .  No crepitus or effusions noted.  No bony pathology or digital deformities noted.  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  Midfoot  DJD  B/L.  Limited rearfoot  ROM  B/L.  Skin  normotropic skin with no porokeratosis noted bilaterally.  No signs of infections or ulcers noted.     Onychomycosis  Pain in right toes  Pain in left toes  Consent was obtained for treatment procedures.   Mechanical debridement of nails 1-5  bilaterally performed with a nail nipper.  Filed with dremel without incident.    Return office visit    3 months                  Told patient to return for periodic foot care and evaluation due to potential at risk complications.   Helane Gunther DPM

## 2024-06-03 ENCOUNTER — Telehealth: Payer: Self-pay

## 2024-06-03 DIAGNOSIS — I5022 Chronic systolic (congestive) heart failure: Secondary | ICD-10-CM

## 2024-06-03 NOTE — Telephone Encounter (Signed)
-----   Message from Nurse Corean PARAS sent at 06/03/2024  8:25 AM EST ----- Regarding: RE: 07/03/24 - BiV ICD - GT 2 days ----- Message ----- From: Dreama Earing, CMA Sent: 06/01/2024   4:40 PM EST To: Corean LOISE Ferri, RN Subject: 07/03/24 - BiV ICD - GT                          How long does pt need to hold his ASA?? ----- Message ----- From: Ferri Corean LOISE, RN Sent: 04/24/2024   2:46 PM EST To: Michail Boyte, CMA; [place] MALVA Sever, RN;# Subject: BiV ICD 07/03/24 GT                              Important: list procedure date as first item in subject line, followed by procedure type (e.g., 03/13/24 PPM implant)  Precert:  MD: [place] Type of implant: CRT-D Device manufacturer: Medtronic Diagnosis: Cardiomyopathy CPT code: CRT-D implant - 66750+66774 C-code(s), including quantity (if indicated): 1882, 1898, 1777, 1900 Procedure scheduled (date/time): 07/03/24  Procedure:  Scrub given? No  Medication instructions: Routine Added to calendar? Yes Orders entered? No, >30 days before procedure Letter complete? No, >30 days before procedure Scheduled with cath lab? Yes Labs ordered (CBC, BMET, PT/INR if on warfarin)? No Dye allergy? No Pre-meds ordered and instructions given? No, >30 days before procedure Letter method: N/A Special instructions: N/A H&P: 04/23/24  Follow-up:  Cassie/Angel, please schedule Routine.  Covering RN:  Please send this message to [employer], EP scheduler, EP Scheduling pool, and EP [employer].

## 2024-06-03 NOTE — Progress Notes (Signed)
 [facility] Note  PCP: Leontine Cramp, NP HF Cardiology: Dr. Rolan  73 y.o. with history of CHF, [name], [name] for followup of CHF. He has a history of mild LV systolic dysfunction (EF 45-50% range) with probably significant RV failure. Patient was admitted in 4/20 with hypoxic respiratory failure due to PNA and decompensated HF. Hospitalization was complicated by PEA arrest and AKI.   In 6/20, he was again admitted with hypoxemic respiratory failure with hypertensive crisis, CHF, and PNA.    Due to frequent hospital admission for CHF, underwent Cardiomems implantation 11/20.  RHC at that time showed optimized filling pressures after adjustment of torsemide . Outpatient sleep study found to have severe OSA and is now using BiPAP. He was also referred to HF paramedicine program.   Echo 6/21 showed EF 45-50%, grade I DD, RV normal, mild to moderate TR.   Last seen 01/2020. Discharged from paramedicine 9/21 after demonstrating competency managing his medications. Unfortunately lost to follow up but has been using Cardiomems regularly.  Follow up 10/23, we had not seen him since 01/2020. NYHA II-early III symptoms and volume overloaded. Torsemide  increased to 80 bid and metolazone  2.5 mg added weekly.  Echo 3/24 showed EF 35-40% with paradoxical septal motion, consistent with LBBB, grade I DD, normal RV, mild to moderate AI, AoRoot dilation 41 mm, ascending aorta 45 mm.   cMRI 7/24 showed mild LV dilation with EF 38%, septal-lateral dyssynchrony, RV EF 47%, nonspecific inferior RV insertion site LGE, moderate MR.   RHC in 5/25 showed mildly elevated filling pressures with preserved cardiac output.  Farxiga  was restarted.   Echo 6/25 showed EF around 35% by my read with septal-lateral dyssynchrony consistent with LBBB, normal RV size and systolic function, mild MR, functionally bicuspid aortic valve with mild AS and moderate aortic insufficiency, ascending aorta  4.8 cm.   Cardiac MRI in 8/25 showed mild LV dilation with LV EF 34%, septal-lateral dyssynchrony, RV EF 40%, functionally bicuspid aortic valve with probably mild AS and mild AI, mid-wall LGE basal to mid inferoseptum at the RV insertion site, ascending aorta 4.5 cm.   Today he returns for HF follow up with his wife. Overall feeling fatigued. He has SOB with bending over. No SOB walking on flat ground. He walks with a quad cane for balance. He has indigestion at nighttime. Denies palpitations, abnormal bleeding, CP, dizziness, edema, or PND/Orthopnea. Appetite ok. Not weighing at home. Taking all medications. Planning CRT-D implant with Dr Waddell soon. Not wearing CPAP as mask is broken.  ECG (personally reviewed): none ordered today  Cardiomems (personally reviewed): Today's reading 26, goal is 21   Labs (12/24): LDL 70 Labs (5/25): K 3.5, creatinine 2.23 Labs (7/25): K 4.0, creatinine 2.27 Labs (10/25): K 3.7, creatinine 2.74, LDL 73  PMH: 1. HTN 2. [name] stage 3 3. Type 2 diabetes 4. Hyperlipidemia 5. COPD: Suspected 6. PNA: Recurrent, suspected aspiration.  7. PEA arrest 4/20: Respiratory arrest.  8. LBBB 9. TIA 10. Ascending aortic aneurysm: CT chest in 6/20 with 5.1 cm ascending aortic aneurysm. - CT chest w/o contrast (12/23): 4.9 cm ascending aorta.  - Echo (6/25): 4.8 cm ascending aorta.  - MRA chest (8/25): 4.5 cm ascending aorta.  11. 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  - Echo (12/19): EF 45-50%.  - Echo (4/20): EF 45-50%, diffuse hypokinesis, normal RV.  - Cardiomems placement (11/20). - RHC (11/20): mean RA 2, PA 33/8, mean PCWP  11, CI 3.32 - Echo (6/21): EF 45-50%, grade I DD, RV normal, mild to moderate TR - Echo (3/24): EF 35-40%, evidence of LBBB - cMRI (7/24): showed mild LV dilation with EF 38%, septal-lateral dyssynchrony, RV EF 47%, nonspecific inferior RV insertion site LGE, moderate MR.  - RHC (5/25): mean RA 9, PA 40/20  mean 27, mean PCWP 19, CI 2.67 F/3.03 T, PAPi 2.2, PVR 1.4 WU - Echo (6/25): EF 35% with septal-lateral dyssynchrony consistent with LBBB, normal RV size and systolic function, mild MR, functionally bicuspid aortic valve with mild AS and moderate aortic insufficiency, ascending aorta 4.8 cm.  - Cardiac MRI (8/25): Mild LV dilation with LV EF 34%, septal-lateral dyssynchrony, RV EF 40%, functionally bicuspid aortic valve with probably mild AS and mild AI, mid-wall LGE basal to mid inferoseptum at the RV insertion site, ascending aorta 4.5 cm.  12. Ventricular ectopy: 8/20 monitor showed PVCs and NSVT.  13. Peripheral arterial dopplers (10/20): No significant stenosis.   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 OSA: Bipap  Social History   Socioeconomic History   Marital status: Married    Spouse name: Not on file   Number of children: Not on file   Years of education: Not on file   Highest education level: Not on file  Occupational History   Not on file  Tobacco Use   Smoking status: Never   Smokeless tobacco: Never  Vaping Use   Vaping status: Never Used  Substance and Sexual Activity   Alcohol  use: No   Drug use: No   Sexual activity: Not Currently    Partners: Female    Birth control/protection: None  Other Topics Concern   Not on file  Social History Narrative   Patient lives in private residence with supportive spouse   Social Drivers of Health   Financial Resource Strain: Low Risk  (10/15/2017)   Overall Financial Resource Strain (CARDIA)    Difficulty of Paying Living Expenses: Not very hard  Food Insecurity: No Food Insecurity (10/15/2017)   Hunger Vital Sign    Worried About Running Out of Food in the Last Year: Never true    Ran Out of Food in the Last Year: Never true  Transportation Needs: Unknown (10/15/2017)   PRAPARE - Transportation    Lack of Transportation (Medical): Patient declined    Lack of Transportation (Non-Medical): Patient declined   Physical Activity: Inactive (10/15/2017)   Exercise Vital Sign    Days of Exercise per Week: 0 days    Minutes of Exercise per Session: 0 min  Stress: No Stress Concern Present (10/15/2017)   [employer] of Occupational Health - Occupational Stress Questionnaire    Feeling of Stress : Not at all  Social Connections: Unknown (10/15/2017)   Social Connection and Isolation Panel    Frequency of Communication with Friends and Family: Patient declined    Frequency of Social Gatherings with Friends and Family: Patient declined    Attends Religious Services: Patient declined    Database Administrator or Organizations: Patient declined    Attends Banker Meetings: Patient declined    Marital Status: Patient declined  Intimate Partner Violence: Not At Risk (10/15/2017)   Humiliation, Afraid, Rape, and Kick questionnaire    Fear of Current or Ex-Partner: No    Emotionally Abused: No    Physically Abused: No    Sexually Abused: No   Family History  Problem Relation Age of Onset   Emphysema Mother  Aneurysm Mother    Emphysema Father    Coronary artery disease Brother    Coronary artery disease Brother    Coronary artery disease Sister    ROS: All systems reviewed and negative except as per HPI.   Current Outpatient Medications  Medication Sig Dispense Refill   acetaminophen  (TYLENOL ) 500 MG tablet Take 500-1,000 mg by mouth every 6 (six) hours as needed for moderate pain (pain score 4-6).     ASPIRIN  81 PO Take 81 mg by mouth daily.     atorvastatin  (LIPITOR) 40 MG tablet Take 1 tablet (40 mg total) by mouth every evening. 30 tablet 11   carvedilol  (COREG ) 12.5 MG tablet TAKE ONE TABLET BY MOUTH TWICE A DAY 180 tablet 3   dapagliflozin  propanediol (FARXIGA ) 10 MG TABS tablet Take 1 tablet (10 mg total) by mouth daily before breakfast. 90 tablet 1   finasteride  (PROSCAR ) 5 MG tablet Take 5 mg by mouth daily.     fluticasone  (FLONASE) 50 MCG/ACT nasal spray Place 1  spray into both nostrils daily as needed for allergies or rhinitis.     gabapentin  (NEURONTIN ) 300 MG capsule Take 300 mg by mouth 2 (two) times daily.     glipiZIDE  (GLUCOTROL  XL) 5 MG 24 hr tablet Take 5 mg by mouth daily.     Iron Combinations (IRON COMPLEX PO) Take 1 tablet by mouth daily.     Loratadine (ALLERGY RELIEF 24-HR PO) Take 1 tablet by mouth daily.     metolazone  (ZAROXOLYN ) 2.5 MG tablet Take 1 tablet (2.5 mg total) by mouth once a week. 30 tablet 1   montelukast  (SINGULAIR ) 10 MG tablet Take 10 mg by mouth daily.      Multiple Vitamin (MULTIVITAMIN) tablet Take 1 tablet by mouth daily.     pantoprazole  (PROTONIX ) 40 MG tablet Take 1 tablet (40 mg total) by mouth daily. 30 tablet 1   potassium chloride  SA (KLOR-CON  M20) 20 MEQ tablet Take 2 tablets (40 mEq total) by mouth daily. 180 tablet 3   spironolactone  (ALDACTONE ) 25 MG tablet Take 1 tablet (25 mg total) by mouth daily. 90 tablet 3   SYMBICORT 80-4.5 MCG/ACT inhaler Inhale 2 puffs into the lungs daily as needed (wheezing).     tamsulosin  (FLOMAX ) 0.4 MG CAPS capsule Take 0.4 mg by mouth daily.     torsemide  (DEMADEX ) 100 MG tablet Take 1 tablet (100 mg total) by mouth 2 (two) times daily. 180 tablet 3   VENTOLIN  HFA 108 (90 Base) MCG/ACT inhaler Inhale 2 puffs into the lungs every 6 (six) hours as needed for wheezing or shortness of breath.      No current facility-administered medications for this encounter.   Wt Readings from Last 3 Encounters:  06/05/24 89 kg (196 lb 3.2 oz)  04/23/24 86.2 kg (190 lb)  04/06/24 87.3 kg (192 lb 6.4 oz)   BP 130/62   Pulse 69   Wt 89 kg (196 lb 3.2 oz)   SpO2 94%   BMI 30.73 kg/m   Physical Exam: General:  NAD. No resp difficulty, walked into clinic with cane, chronically-ill appearing. HEENT: Normal Neck: Supple. JVP 10 Cor: Regular rate & rhythm. No rubs, gallops , 2/6 SEM RUSB Lungs: Clear Abdomen: Soft, nontender, nondistended.  Extremities: No cyanosis, clubbing, rash,  edema Neuro: Alert & oriented x 3, moves all 4 extremities w/o difficulty. Affect pleasant.   Assessment/Plan: 1. Chronic systolic CHF:  Nonischemic cardiomyopathy.  Echo 4/20 with EF 45-50%.  Cath in  9/12 showed nonobstructive coronary disease.  S/p Cardiomems implant 11/20. Echo (6/21) with EF 45-50%, grade I DD, RV normal, mild to moderate TR. Echo (3/24) showed EF 35-40% with paradoxical septal motion. Cardiac MRI in 7/24 showed mild LV dilation with EF 38%, septal-lateral dyssynchrony, RV EF 47%, nonspecific inferior RV insertion site LGE (not suggestive of coronary disease), moderate MR. Mod AI.  Echo 6/25 by my read showed EF 35% with septal-lateral dyssynchrony consistent with LBBB, normal RV size and systolic function, mild MR, functionally bicuspid aortic valve with mild AS and moderate aortic insufficiency, ascending aorta 4.8 cm.  Cardiac MRI was done in 8/25 to quantify EF, this showed mild LV dilation with LV EF 34%, septal-lateral dyssynchrony, RV EF 40%, functionally bicuspid aortic valve with probably mild AS and mild AI, mid-wall LGE basal to mid inferoseptum at the RV insertion site, ascending aorta 4.5 cm.  NYHA class II-early III, mild volume overload based on Cardiomems and weight.   - Continue torsemide  100 mg bid + metolazone  once weekly, on Sundays. I will have him take an extra metolazone  today with extra 40 KCL, given mildly elevated PADP by Cardiomems today.  BMET/BNP today.  - Continue spironolactone  25 mg daily.   - Continue Coreg  12.5 mg bid.  - Continue Farxiga  10 mg daily.   - No ARB/ARNI yet with elevated creatinine.  - Patient has a wide LBBB with dyssynchrony by imaging, cardiac MRI showed EF < 35%.  He is planning on CRT-D soon 2. [name]: Stage IV. Last SCr 2.74 - BMET today.  3. OSA: CPAP mask is broken, he will call to get it repaired.  4. Ascending aortic aneurysm: MRA in 8/25 showed stable 4.5 cm ascending aorta.  5. Hyperlipidemia: Goal LDL < 70 with diabetes.   - Continue atorvastatin  40 mg daily. 6. Diabetes Type 2:  - Management per PCP. 7. Fatigue: likely multifactorial. Needs CPAP - Check CBC and iron panel today.  Follow up in 2 months with APP.  Harlene HERO [facility] FNP-BC 06/05/24

## 2024-06-03 NOTE — Telephone Encounter (Signed)
 Instruction letter will be mailed to pt...  Pt has appointment at the [facility] on 12/5 and will have procedure labs done that day.

## 2024-06-04 ENCOUNTER — Telehealth (HOSPITAL_COMMUNITY): Payer: Self-pay

## 2024-06-04 NOTE — Telephone Encounter (Signed)
 Called to confirm/remind patient of their appointment at the [facility] Clinic on 06/05/24.   Appointment:   [] Confirmed  [x] Left mess   [] No answer/No voice mail  [] VM Full/unable to leave message  [] Phone not in service  And to bring in all medications and/or complete list.

## 2024-06-05 ENCOUNTER — Ambulatory Visit (HOSPITAL_COMMUNITY)
Admission: RE | Admit: 2024-06-05 | Discharge: 2024-06-05 | Disposition: A | Source: Ambulatory Visit | Attending: Family Medicine

## 2024-06-05 ENCOUNTER — Encounter (HOSPITAL_COMMUNITY): Payer: Self-pay

## 2024-06-05 ENCOUNTER — Ambulatory Visit (HOSPITAL_COMMUNITY): Payer: Self-pay | Admitting: Family Medicine

## 2024-06-05 VITALS — BP 130/62 | HR 69 | Wt 196.2 lb

## 2024-06-05 DIAGNOSIS — G4733 Obstructive sleep apnea (adult) (pediatric): Secondary | ICD-10-CM | POA: Diagnosis not present

## 2024-06-05 DIAGNOSIS — E785 Hyperlipidemia, unspecified: Secondary | ICD-10-CM

## 2024-06-05 DIAGNOSIS — I7121 Aneurysm of the ascending aorta, without rupture: Secondary | ICD-10-CM | POA: Diagnosis not present

## 2024-06-05 DIAGNOSIS — N183 Chronic kidney disease, stage 3 unspecified: Secondary | ICD-10-CM

## 2024-06-05 DIAGNOSIS — R5383 Other fatigue: Secondary | ICD-10-CM

## 2024-06-05 DIAGNOSIS — I5022 Chronic systolic (congestive) heart failure: Secondary | ICD-10-CM

## 2024-06-05 DIAGNOSIS — E118 Type 2 diabetes mellitus with unspecified complications: Secondary | ICD-10-CM

## 2024-06-05 LAB — IRON AND TIBC
Iron: 41 ug/dL — ABNORMAL LOW (ref 45–182)
Saturation Ratios: 11 % — ABNORMAL LOW (ref 17.9–39.5)
TIBC: 374 ug/dL (ref 250–450)
UIBC: 333 ug/dL

## 2024-06-05 LAB — BASIC METABOLIC PANEL WITH GFR
Anion gap: 9 (ref 5–15)
BUN: 49 mg/dL — ABNORMAL HIGH (ref 8–23)
CO2: 31 mmol/L (ref 22–32)
Calcium: 8 mg/dL — ABNORMAL LOW (ref 8.9–10.3)
Chloride: 100 mmol/L (ref 98–111)
Creatinine, Ser: 2.91 mg/dL — ABNORMAL HIGH (ref 0.61–1.24)
GFR, Estimated: 22 mL/min — ABNORMAL LOW (ref >60)
Glucose, Bld: 156 mg/dL — ABNORMAL HIGH (ref 70–99)
Potassium: 4.8 mmol/L (ref 3.5–5.1)
Sodium: 140 mmol/L (ref 135–145)

## 2024-06-05 LAB — CBC
HCT: 30.8 % — ABNORMAL LOW (ref 39.0–52.0)
Hemoglobin: 9.5 g/dL — ABNORMAL LOW (ref 13.0–17.0)
MCH: 28 pg (ref 26.0–34.0)
MCHC: 30.8 g/dL (ref 30.0–36.0)
MCV: 90.9 fL (ref 80.0–100.0)
Platelets: 276 K/uL (ref 150–400)
RBC: 3.39 MIL/uL — ABNORMAL LOW (ref 4.22–5.81)
RDW: 14 % (ref 11.5–15.5)
WBC: 7.3 K/uL (ref 4.0–10.5)
nRBC: 0 % (ref 0.0–0.2)

## 2024-06-05 LAB — FERRITIN: Ferritin: 7 ng/mL — ABNORMAL LOW (ref 24–336)

## 2024-06-05 LAB — BRAIN NATRIURETIC PEPTIDE: B Natriuretic Peptide: 361.2 pg/mL — ABNORMAL HIGH (ref 0.0–100.0)

## 2024-06-05 NOTE — Addendum Note (Signed)
 Encounter addended by: Dante Jeannine HERO, CMA on: 06/05/2024 3:01 PM  Actions taken: Order list changed, Diagnosis association updated, Flowsheet accepted, Clinical Note Signed, Charge Capture section accepted

## 2024-06-05 NOTE — Progress Notes (Signed)
 ReDS Vest / Clip - 06/05/24 1500       ReDS Vest / Clip   Station Marker D    Ruler Value 33    ReDS Value Range Low volume    ReDS Actual Value 34

## 2024-06-05 NOTE — Patient Instructions (Addendum)
 Good to see you today!  TAKE an extra dose of metolazone  2.5 mg and potassium 40 meq TODAY  CONTINUE  with Sunday dose of metolazone  and potassium  Labs done today, your results will be available in MyChart, we will contact you for abnormal readings.  Your physician recommends that you schedule a follow-up appointment as scheduled  If you have any questions or concerns before your next appointment please send us  a message through [place] or call our office at [phone].    TO LEAVE A MESSAGE FOR THE NURSE SELECT OPTION 2, PLEASE LEAVE A MESSAGE INCLUDING: YOUR NAME DATE OF BIRTH CALL BACK NUMBER REASON FOR CALL**this is important as we prioritize the call backs  YOU WILL RECEIVE A CALL BACK THE SAME DAY AS LONG AS YOU CALL BEFORE 4:00 PM At the [facility], you and your health needs are our priority. As part of our continuing mission to provide you with exceptional heart care, we have created designated Provider Care Teams. These Care Teams include your primary Cardiologist (physician) and Advanced Practice Providers (APPs- Physician Assistants and Nurse Practitioners) who all work together to provide you with the care you need, when you need it.   You may see any of the following providers on your designated Care Team at your next follow up: Dr Toribio Fuel Dr Ezra Shuck Dr. Morene Brownie Greig Mosses, NP Caffie Shed, [place] [facility], [place] Beckey Coe, NP Jordan Lee, NP Ellouise Class, NP Tinnie Redman, PharmD Jaun Bash, PharmD   Please be sure to bring in all your medications bottles to every appointment.    Thank you for choosing [facility]

## 2024-06-08 ENCOUNTER — Telehealth (HOSPITAL_COMMUNITY): Payer: Self-pay | Admitting: *Deleted

## 2024-06-08 ENCOUNTER — Telehealth (HOSPITAL_COMMUNITY): Payer: Self-pay | Admitting: Pharmacist

## 2024-06-08 ENCOUNTER — Telehealth (HOSPITAL_COMMUNITY): Payer: Self-pay | Admitting: Pharmacy Technician

## 2024-06-08 DIAGNOSIS — E611 Iron deficiency: Secondary | ICD-10-CM | POA: Insufficient documentation

## 2024-06-08 DIAGNOSIS — I5022 Chronic systolic (congestive) heart failure: Secondary | ICD-10-CM

## 2024-06-08 DIAGNOSIS — D509 Iron deficiency anemia, unspecified: Secondary | ICD-10-CM | POA: Insufficient documentation

## 2024-06-08 NOTE — Telephone Encounter (Signed)
 Patient referred to infusion pharmacy team for ambulatory infusion of IV iron .  Insurance - [employer] Site of care - Site of care: [facility] Dx code - I50.22/E61.1 IV Iron  Therapy - Venofer  300mg  x 3 Infusion appointments - Scheduling team will schedule patient as soon as possible.

## 2024-06-08 NOTE — Telephone Encounter (Signed)
 Called patient's wife per Harlene Gainer, NP with following results:  T sat and ferritin are low, could be contributing to his fatigue. Please arrange iron infusion, if he is agreeable  Wife verbalized understanding of same. Orders placed, sent request to [facility] Infusion [facility]. Advised patient they will call her to schedule after insurance authorization obtained.

## 2024-06-08 NOTE — Telephone Encounter (Signed)
 Auth Submission: NO AUTH NEEDED Site of care: [facility] Payer: [employer] MEDICARE Medication & CPT/J Code(s) submitted: Venofer  (Iron  Sucrose) J1756 Diagnosis Code: D50.9, E61.1, I50.22 Route of submission (phone, fax, portal):  Phone # Fax # Auth type: Buy/Bill HB Units/visits requested: 300mg  x 3 doses Reference number:  Approval from: 06/08/2024 to 09/06/24    Dagoberto Armour, CPhT [facility] Phone: [phone] 06/08/2024

## 2024-06-11 ENCOUNTER — Encounter (HOSPITAL_COMMUNITY)

## 2024-06-11 ENCOUNTER — Telehealth (HOSPITAL_COMMUNITY): Payer: Self-pay

## 2024-06-11 NOTE — Telephone Encounter (Signed)
 Attempted to reach patient to discuss upcoming procedure, no answer. Left VM for patient to return call.

## 2024-06-11 NOTE — Telephone Encounter (Signed)
 Spoke with patient to complete pre-procedure call.     Health status review:  Any new medical conditions, recent signs of acute illness or been started on antibiotics? Low Hgb, ferritin and T sat per recent labs and scheduled to receive 3 iron infusions.  Any recent hospitalizations or surgeries? No Any new medications started since pre-op visit? No  Follow all medication instructions prior to procedure or the procedure may be rescheduled:    HOLD: Dapagliflozin  (Farxiga ) for 3 days prior to the procedure. Last dose on Monday, December 29.  HOLD: Aspirin  for 2 days prior to your procedure. Your last dose will be Tuesday, December 30.  DO NOT TAKE Spironolactone , Torsemide , Metolazone , or Glipizide   the morning of your procedure.  On the morning of your procedure, take all other morning medications not discussed with a sip of water .  The night before your procedure and the morning of your procedure, wash thoroughly with the CHG surgical soap from the neck down, paying special attention to the area where your procedure will be performed.  Nothing to eat or drink after midnight prior to your procedure.  Pre-procedure testing scheduled: lab work completed.  Confirmed patient is scheduled for  Biventricular permanent transvenous pacemaker on Friday, January 2 with Dr. Waddell. Instructed patient to arrive at the Main Entrance A at [facility]: [address] [place], [place] [address] and check in at Admitting at 5:30 AM.  Plan to go home the same day, you will only stay overnight if medically necessary. You MUST have a responsible adult to drive you home and MUST be with you the first 24 hours after you arrive home or your procedure could be cancelled.  Informed a nurse may call a day before the procedure to confirm arrival time and ensure instructions are followed.  Patient verbalized understanding to information provided and is agreeable to proceed with procedure.   Advised to contact  RN Navigator at [phone], to inform of any new medications started after call or concerns prior to procedure.

## 2024-06-12 ENCOUNTER — Encounter (HOSPITAL_COMMUNITY)
Admission: RE | Admit: 2024-06-12 | Discharge: 2024-06-12 | Disposition: A | Source: Ambulatory Visit | Attending: Family Medicine

## 2024-06-12 VITALS — BP 106/60 | HR 77 | Temp 97.7°F | Resp 16

## 2024-06-12 DIAGNOSIS — I5022 Chronic systolic (congestive) heart failure: Secondary | ICD-10-CM | POA: Insufficient documentation

## 2024-06-12 DIAGNOSIS — D509 Iron deficiency anemia, unspecified: Secondary | ICD-10-CM

## 2024-06-12 DIAGNOSIS — E611 Iron deficiency: Secondary | ICD-10-CM | POA: Insufficient documentation

## 2024-06-12 MED ORDER — IRON SUCROSE 300 MG IVPB - SIMPLE MED
300.0000 mg | Freq: Once | Status: AC
  Administered 2024-06-12: 300 mg via INTRAVENOUS
  Filled 2024-06-12: qty 300

## 2024-06-18 ENCOUNTER — Encounter (HOSPITAL_COMMUNITY)

## 2024-06-19 ENCOUNTER — Inpatient Hospital Stay (HOSPITAL_COMMUNITY): Admission: RE | Admit: 2024-06-19 | Discharge: 2024-06-19 | Attending: Family Medicine

## 2024-06-19 VITALS — BP 118/74 | HR 73 | Temp 98.7°F | Resp 16

## 2024-06-19 DIAGNOSIS — D509 Iron deficiency anemia, unspecified: Secondary | ICD-10-CM | POA: Diagnosis not present

## 2024-06-19 DIAGNOSIS — I5022 Chronic systolic (congestive) heart failure: Secondary | ICD-10-CM

## 2024-06-19 DIAGNOSIS — E611 Iron deficiency: Secondary | ICD-10-CM

## 2024-06-19 MED ORDER — IRON SUCROSE 300 MG IVPB - SIMPLE MED
300.0000 mg | Freq: Once | Status: AC
  Administered 2024-06-19: 300 mg via INTRAVENOUS
  Filled 2024-06-19: qty 300

## 2024-06-26 ENCOUNTER — Ambulatory Visit (HOSPITAL_COMMUNITY)
Admission: RE | Admit: 2024-06-26 | Discharge: 2024-06-26 | Disposition: A | Discharge status: Home / Self Care | Source: Ambulatory Visit | Attending: Family Medicine | Admitting: Family Medicine

## 2024-06-26 VITALS — BP 106/80 | HR 67 | Temp 98.5°F | Resp 16

## 2024-06-26 DIAGNOSIS — E611 Iron deficiency: Secondary | ICD-10-CM | POA: Diagnosis present

## 2024-06-26 DIAGNOSIS — D509 Iron deficiency anemia, unspecified: Secondary | ICD-10-CM | POA: Diagnosis present

## 2024-06-26 DIAGNOSIS — I5022 Chronic systolic (congestive) heart failure: Secondary | ICD-10-CM | POA: Insufficient documentation

## 2024-06-26 MED ORDER — IRON SUCROSE 300 MG IVPB - SIMPLE MED
300.0000 mg | Freq: Once | Status: AC
  Administered 2024-06-26: 300 mg via INTRAVENOUS
  Filled 2024-06-26: qty 300

## 2024-07-01 ENCOUNTER — Encounter: Payer: Self-pay | Admitting: Emergency Medicine

## 2024-07-01 NOTE — Telephone Encounter (Signed)
 No need to repeat labs, per Dr Waddell.

## 2024-07-01 NOTE — Pre-Procedure Instructions (Signed)
 Spoke with wife Consuelo.  Instructed patient on the following items: Arrival time 0515 Nothing to eat or drink after midnight No meds AM of procedure Responsible person to drive you home and stay with you for 24 hrs Wash with special soap night before and morning of procedure If on anti-coagulant drug instructions ASA-last dose 12/30

## 2024-07-03 ENCOUNTER — Other Ambulatory Visit: Payer: Self-pay

## 2024-07-03 ENCOUNTER — Ambulatory Visit (HOSPITAL_COMMUNITY)
Admission: RE | Admit: 2024-07-03 | Discharge: 2024-07-03 | Disposition: A | Discharge status: Home / Self Care | Source: Home / Self Care | Attending: Internal Medicine | Admitting: Internal Medicine

## 2024-07-03 ENCOUNTER — Encounter (HOSPITAL_COMMUNITY)
Admission: RE | Disposition: A | Discharge status: Home / Self Care | Payer: Self-pay | Source: Home / Self Care | Attending: Internal Medicine

## 2024-07-03 ENCOUNTER — Ambulatory Visit (HOSPITAL_COMMUNITY)

## 2024-07-03 DIAGNOSIS — Z7984 Long term (current) use of oral hypoglycemic drugs: Secondary | ICD-10-CM | POA: Diagnosis not present

## 2024-07-03 DIAGNOSIS — E1122 Type 2 diabetes mellitus with diabetic chronic kidney disease: Secondary | ICD-10-CM | POA: Diagnosis not present

## 2024-07-03 DIAGNOSIS — I447 Left bundle-branch block, unspecified: Secondary | ICD-10-CM | POA: Insufficient documentation

## 2024-07-03 DIAGNOSIS — N189 Chronic kidney disease, unspecified: Secondary | ICD-10-CM | POA: Insufficient documentation

## 2024-07-03 DIAGNOSIS — I428 Other cardiomyopathies: Secondary | ICD-10-CM | POA: Diagnosis present

## 2024-07-03 DIAGNOSIS — Z8674 Personal history of sudden cardiac arrest: Secondary | ICD-10-CM | POA: Insufficient documentation

## 2024-07-03 DIAGNOSIS — I5022 Chronic systolic (congestive) heart failure: Secondary | ICD-10-CM

## 2024-07-03 DIAGNOSIS — I130 Hypertensive heart and chronic kidney disease with heart failure and stage 1 through stage 4 chronic kidney disease, or unspecified chronic kidney disease: Secondary | ICD-10-CM | POA: Diagnosis not present

## 2024-07-03 HISTORY — PX: BIV ICD INSERTION CRT-D: EP1195

## 2024-07-03 HISTORY — PX: LEAD INSERTION: EP1212

## 2024-07-03 LAB — GLUCOSE, CAPILLARY
Glucose-Capillary: 137 mg/dL — ABNORMAL HIGH (ref 70–99)
Glucose-Capillary: 158 mg/dL — ABNORMAL HIGH (ref 70–99)

## 2024-07-03 MED ORDER — CEFAZOLIN SODIUM-DEXTROSE 1-4 GM/50ML-% IV SOLN: 1.0000 g | Freq: Once | INTRAVENOUS | Status: DC

## 2024-07-03 MED ORDER — HEPARIN (PORCINE) IN NACL 1000-0.9 UT/500ML-% IV SOLN
INTRAVENOUS | Status: DC | PRN
  Administered 2024-07-03: 500 mL

## 2024-07-03 MED ORDER — SODIUM CHLORIDE 0.9 % IV SOLN: 80.0000 mg | INTRAVENOUS | Status: AC

## 2024-07-03 MED ORDER — CEFAZOLIN SODIUM-DEXTROSE 2-4 GM/100ML-% IV SOLN
INTRAVENOUS | Status: AC
  Administered 2024-07-03: 2 g via INTRAVENOUS
  Filled 2024-07-03: qty 100

## 2024-07-03 MED ORDER — CHLORHEXIDINE GLUCONATE 4 % EX SOLN
4.0000 | Freq: Once | CUTANEOUS | Status: DC
  Filled 2024-07-03: qty 60

## 2024-07-03 MED ORDER — FENTANYL CITRATE (PF) 100 MCG/2ML IJ SOLN
INTRAMUSCULAR | Status: DC | PRN
  Administered 2024-07-03 (×2): 12.5 ug via INTRAVENOUS

## 2024-07-03 MED ORDER — POVIDONE-IODINE 10 % EX SWAB
2.0000 | Freq: Once | CUTANEOUS | Status: AC
  Administered 2024-07-03: 2 via TOPICAL

## 2024-07-03 MED ORDER — LIDOCAINE HCL (PF) 1 % IJ SOLN
INTRAMUSCULAR | Status: DC | PRN
  Administered 2024-07-03: 30 mL

## 2024-07-03 MED ORDER — LIDOCAINE HCL (PF) 1 % IJ SOLN
INTRAMUSCULAR | Status: AC
  Filled 2024-07-03: qty 60

## 2024-07-03 MED ORDER — CEFAZOLIN SODIUM-DEXTROSE 2-4 GM/100ML-% IV SOLN: 2.0000 g | INTRAVENOUS | Status: AC

## 2024-07-03 MED ORDER — FENTANYL CITRATE (PF) 100 MCG/2ML IJ SOLN
INTRAMUSCULAR | Status: AC
  Filled 2024-07-03: qty 2

## 2024-07-03 MED ORDER — MIDAZOLAM HCL (PF) 2 MG/2ML IJ SOLN
INTRAMUSCULAR | Status: DC | PRN
  Administered 2024-07-03 (×2): 1 mg via INTRAVENOUS

## 2024-07-03 MED ORDER — SODIUM CHLORIDE 0.9 % IV SOLN: INTRAVENOUS | Status: DC

## 2024-07-03 MED ORDER — MIDAZOLAM HCL 2 MG/2ML IJ SOLN
INTRAMUSCULAR | Status: AC
  Filled 2024-07-03: qty 2

## 2024-07-03 MED ORDER — SODIUM CHLORIDE 0.9 % IV SOLN
INTRAVENOUS | Status: AC
  Administered 2024-07-03: 80 mg
  Filled 2024-07-03: qty 2

## 2024-07-03 NOTE — Discharge Instructions (Addendum)
 After Your ICD (Implantable Cardiac Defibrillator)   You have a Medtronic ICD  If you have a Medtronic or Biotronik device, plug in your home monitor once you get home, and no manual interaction is required.   If you have an Abbott or Autozone device, plug your home monitor once you get home, sit near the device, and press the large activation button. Sit nearby until the process is complete, usually notated by lights on the monitor.   If you were set up for monitoring using an app on your phone, make sure the app remains open in the background and the Bluetooth remains on.  ACTIVITY Do not lift your arm above shoulder height for 1 week after your procedure. After 7 days, you may progress as below.  You should remove your sling 24 hours after your procedure, unless otherwise instructed by your provider.     Friday July 10, 2024  Saturday July 11, 2024 Sunday July 12, 2024 Monday July 13, 2024   Do not lift, push, pull, or carry anything over 10 pounds with the affected arm until 6 weeks (Friday August 14, 2024 ) after your procedure.   You may drive AFTER your wound check, UNLESS you have been told otherwise by your provider.   Ask your healthcare provider when you can go back to work   INCISION/Dressing If you are on a blood thinner such as Coumadin, Xarelto, Eliquis, Plavix, or Pradaxa please confirm with your provider when this should be resumed.   If large square, outer bandage is left in place, this can be removed after 24 hours from your procedure. Do not remove steri-strips or glue as below.   Monitor your defibrillator site for redness, swelling, and drainage. Call the device clinic at [phone] if you experience these symptoms or fever/chills.    If your incision is sealed with Steri-strips or staples, you may shower 7 days after your procedure or when told by your provider. Do not remove the steri-strips or let the shower hit directly on your site.  You may wash around your site with soap and water.    If you were discharged in a sling, please do not wear this during the day more than 48 hours after your surgery unless otherwise instructed. This may increase the risk of stiffness and soreness in your shoulder.   Avoid lotions, ointments, or perfumes over your incision until it is well-healed.  You may use a hot tub or a pool AFTER your wound check appointment if the incision is completely closed.  Your ICD is designed to protect you from life threatening heart rhythms. Because of this, you may receive a shock.   1 shock with no symptoms:  Call the office during business hours. 1 shock with symptoms (chest pain, chest pressure, dizziness, lightheadedness, shortness of breath, overall feeling unwell):  Call 911. If you experience 2 or more shocks in 24 hours:  Call 911. If you receive a shock, you should not drive for 6 months per the [place] DMV IF you receive appropriate therapy from your ICD.   ICD Alerts:  Some alerts are vibratory and others beep. These are NOT emergencies. Please call our office to let us  know. If this occurs at night or on weekends, it can wait until the next business day. Send a remote transmission.  If your device is capable of reading fluid status (for heart failure), you will be offered monthly monitoring to review this with you.   DEVICE MANAGEMENT  Remote monitoring is used to monitor your ICD from home. This monitoring is scheduled every 91 days by our office. It allows us  to keep an eye on the functioning of your device to ensure it is working properly. You will routinely see your Electrophysiologist annually (more often if necessary). This will appear as a REMOTE check on your MyChart schedule. These are automatic and there is nothing for you to manually do unless otherwise instructed.  You should receive your ID card for your new device in 4-8 weeks. Keep this card with you at all times once received. Consider  wearing a medical alert bracelet or necklace.  Your ICD  may be MRI compatible. This will be discussed at your next office visit/wound check.  You should avoid contact with strong electric or magnetic fields.   Do not use amateur (ham) radio equipment or electric (arc) welding torches. MP3 player headphones with magnets should not be used. Some devices are safe to use if held at least 12 inches (30 cm) from your defibrillator. These include power tools, lawn mowers, and speakers. If you are unsure if something is safe to use, ask your health care provider.  When using your cell phone, hold it to the ear that is on the opposite side from the defibrillator. Do not leave your cell phone in a pocket over the defibrillator.  You may safely use electric blankets, heating pads, computers, and microwave ovens.  Call the office right away if: You have chest pain. You feel more than one shock. You feel more short of breath than you have felt before. You feel more light-headed than you have felt before. Your incision starts to open up.  This information is not intended to replace advice given to you by your health care provider. Make sure you discuss any questions you have with your health care provider.

## 2024-07-03 NOTE — Progress Notes (Signed)
 Verbal order from Dr. Waddell for the patient  to restart Aspirin  81 mg tomorrow 07/04/2024. Read back and verified.

## 2024-07-03 NOTE — H&P (Signed)
 "         HPI [name] is referred by Dr. Rolan to consider ICD insertion. He is a pleasant 74 yo man with LBBB and chronic systolic heart failure EF 25%. He has been on GDMT and has class 2B symptoms. He has a QRS duration of  160 ms. He has not had syncope. He did have a PEA arrest in the past. No anginal symptoms.  Allergies       Allergies  Allergen Reactions   Cashew Nut Oil Palpitations   Percocet [Oxycodone -Acetaminophen ] Other (See Comments)      Hallucintaions                Current Outpatient Medications  Medication Sig Dispense Refill   acetaminophen  (TYLENOL ) 500 MG tablet Take 500-1,000 mg by mouth every 6 (six) hours as needed for moderate pain (pain score 4-6).       ASPIRIN  81 PO Take 81 mg by mouth daily.       atorvastatin  (LIPITOR) 40 MG tablet Take 1 tablet (40 mg total) by mouth every evening. 30 tablet 11   carvedilol  (COREG ) 12.5 MG tablet TAKE ONE TABLET BY MOUTH TWICE A DAY 180 tablet 3   dapagliflozin  propanediol (FARXIGA ) 10 MG TABS tablet Take 1 tablet (10 mg total) by mouth daily before breakfast. 90 tablet 1   finasteride  (PROSCAR ) 5 MG tablet Take 5 mg by mouth daily.       fluticasone  (FLONASE) 50 MCG/ACT nasal spray Place 1 spray into both nostrils daily as needed for allergies or rhinitis.       gabapentin  (NEURONTIN ) 300 MG capsule Take 300 mg by mouth 2 (two) times daily.       glipiZIDE  (GLUCOTROL  XL) 5 MG 24 hr tablet Take 5 mg by mouth daily.       Iron  Combinations (IRON  COMPLEX PO) Take 1 tablet by mouth daily.       Loratadine (ALLERGY RELIEF 24-HR PO) Take 1 tablet by mouth daily.       metolazone  (ZAROXOLYN ) 2.5 MG tablet Take 1 tablet (2.5 mg total) by mouth once a week. 30 tablet 1   montelukast  (SINGULAIR ) 10 MG tablet Take 10 mg by mouth daily.        Multiple Vitamin (MULTIVITAMIN) tablet Take 1 tablet by mouth daily.       pantoprazole  (PROTONIX ) 40 MG tablet Take 1 tablet (40 mg total) by mouth daily. 30 tablet 1   potassium  chloride SA (KLOR-CON  M20) 20 MEQ tablet Take 2 tablets (40 mEq total) by mouth daily. 180 tablet 3   spironolactone  (ALDACTONE ) 25 MG tablet Take 1 tablet (25 mg total) by mouth daily. 90 tablet 3   SYMBICORT 80-4.5 MCG/ACT inhaler Inhale 2 puffs into the lungs daily as needed (wheezing).       tamsulosin  (FLOMAX ) 0.4 MG CAPS capsule Take 0.4 mg by mouth daily.       torsemide  (DEMADEX ) 100 MG tablet Take 1 tablet (100 mg total) by mouth 2 (two) times daily. 180 tablet 3   VENTOLIN  HFA 108 (90 Base) MCG/ACT inhaler Inhale 2 puffs into the lungs every 6 (six) hours as needed for wheezing or shortness of breath.           No current facility-administered medications for this visit.              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          Past Surgical History:  Procedure Laterality Date   CIRCUMCISION       LITHOTRIPSY       PRESSURE SENSOR/CARDIOMEMS N/A 05/27/2019    Procedure: PRESSURE SENSOR/CARDIOMEMS;  Surgeon: Rolan Ezra RAMAN, MD;  Location: [facility] INVASIVE CV LAB;  Service: Cardiovascular;  Laterality: N/A;   RIGHT HEART CATH N/A 05/27/2019    Procedure: RIGHT HEART CATH;  Surgeon: Rolan Ezra RAMAN, MD;  Location: [facility] INVASIVE CV LAB;  Service: Cardiovascular;  Laterality: N/A;   RIGHT HEART CATH N/A 11/12/2023    Procedure: RIGHT HEART CATH;  Surgeon: Rolan Ezra RAMAN, MD;   Location: [facility] INVASIVE CV LAB;  Service: Cardiovascular;  Laterality: N/A;   TOTAL HIP ARTHROPLASTY Left 06/16/2020    Procedure: TOTAL HIP ARTHROPLASTY ANTERIOR APPROACH;  Surgeon: Fidel Rogue, MD;  Location: [facility] ORS;  Service: Orthopedics;  Laterality: Left;                 Family History  Problem Relation Age of Onset   Emphysema Mother     Aneurysm Mother     Emphysema Father     Coronary artery disease Brother     Coronary artery disease Brother     Coronary artery disease Sister              Social History         Socioeconomic History   Marital status: Married      Spouse name: Not on file   Number of children: Not on file   Years of education: Not on file   Highest education level: Not on file  Occupational History   Not on file  Tobacco Use   Smoking status: Never   Smokeless tobacco: Never  Vaping Use   Vaping status: Never Used  Substance and Sexual Activity   Alcohol  use: No   Drug use: No   Sexual activity: Not Currently      Partners: Female      Birth control/protection: None  Other Topics Concern   Not on file  Social History Narrative    Patient lives in private residence with supportive spouse    Social Drivers of Health        Financial Resource Strain: Low Risk  (10/15/2017)    Overall Financial Resource Strain (CARDIA)     Difficulty of Paying Living Expenses: Not very hard  Food Insecurity: No Food Insecurity (10/15/2017)    Hunger Vital Sign     Worried About Running Out of Food in the Last Year: Never true     Ran Out of  Food in the Last Year: Never true  Transportation Needs: Unknown (10/15/2017)    PRAPARE - Transportation     Lack of Transportation (Medical): Patient declined     Lack of Transportation (Non-Medical): Patient declined  Physical Activity: Inactive (10/15/2017)    Exercise Vital Sign     Days of Exercise per Week: 0 days     Minutes of Exercise per Session: 0 min  Stress: No Stress Concern Present (10/15/2017)     [employer] of Occupational Health - Occupational Stress Questionnaire     Feeling of Stress : Not at all  Social Connections: Unknown (10/15/2017)    Social Connection and Isolation Panel     Frequency of Communication with Friends and Family: Patient declined     Frequency of Social Gatherings with Friends and Family: Patient declined     Attends Religious Services: Patient declined     Database Administrator or Organizations: Patient declined     Attends Banker Meetings: Patient declined     Marital Status: Patient declined  Intimate Partner Violence: Not At Risk (10/15/2017)    Humiliation, Afraid, Rape, and Kick questionnaire     Fear of Current or Ex-Partner: No     Emotionally Abused: No     Physically Abused: No     Sexually Abused: No        BP 106/62 (BP Location: Right Arm, Patient Position: Sitting, Cuff Size: Normal)   Pulse 64   Ht 5' 7 (1.702 m)   Wt 190 lb (86.2 kg)   SpO2 94%   BMI 29.76 kg/m    Physical Exam:   Well appearing NAD HEENT: Unremarkable Neck:  No JVD, no thyromegally Lymphatics:  No adenopathy Back:  No CVA tenderness Lungs:  Clear with no wheezes HEART:  Regular rate rhythm, no murmurs, no rubs, no clicks'split S2.  Abd:  soft, positive bowel sounds, no organomegally, no rebound, no guarding Ext:  2 plus pulses, no edema, no cyanosis, no clubbing Skin:  No rashes no nodules Neuro:  CN II through XII intact, motor grossly intact   EKG - reviewed NSR with LBBB   Assess/Plan: Non-ischemic CM with class 2B CHF EF 25% - he is on GDMT. I expect for some improvement with resychronization. I have discussed the indications/risks/benefits/goals/expectations of Biv ICD insertion with the patient and he wishes to proceed. LBBB - his QRS is quite wide. Hopefully this will improve.    Danelle Rockie Schnoor,MD "

## 2024-07-03 NOTE — Progress Notes (Signed)
 Patient currently consuming a regular diet with cola zero; tolerating well with no emesis. Alert and oriented x 4.

## 2024-07-06 ENCOUNTER — Encounter (HOSPITAL_COMMUNITY): Payer: Self-pay | Admitting: Internal Medicine

## 2024-07-07 ENCOUNTER — Telehealth: Payer: Self-pay

## 2024-07-07 NOTE — Telephone Encounter (Signed)
 Follow-up after same day discharge: Implant date: 07/03/2024 MD: Kennyth Device: ICD  Location: L chest    Wound check visit: 07/14/2024 90 day MD follow-up: 10/05/2024  Remote Transmission received:not yet lvm for pt to call back   Dressing/sling removed: n/a  Confirm OAC restart on: n/a  Please continue to monitor your cardiac device site for redness, swelling, and drainage. Call the device clinic at [phone] if you experience these symptoms, fever/chills, or have questions about your device.   Remote monitoring is used to monitor your cardiac device from home. This monitoring is scheduled every 91 days by our office. It allows us  to keep an eye on the functioning of your device to ensure it is working properly.

## 2024-07-08 NOTE — Telephone Encounter (Signed)
 Tried calling pt again LVM for pt to call back

## 2024-07-14 ENCOUNTER — Ambulatory Visit: Admitting: *Deleted

## 2024-07-14 DIAGNOSIS — I447 Left bundle-branch block, unspecified: Secondary | ICD-10-CM

## 2024-07-14 DIAGNOSIS — I5022 Chronic systolic (congestive) heart failure: Secondary | ICD-10-CM

## 2024-07-14 LAB — CUP PACEART INCLINIC DEVICE CHECK
Date Time Interrogation Session: 20260113132142
Implantable Lead Connection Status: 753985
Implantable Lead Connection Status: 753985
Implantable Lead Connection Status: 753985
Implantable Lead Implant Date: 20260102
Implantable Lead Implant Date: 20260102
Implantable Lead Implant Date: 20260102
Implantable Lead Location: 753858
Implantable Lead Location: 753859
Implantable Lead Location: 753860
Implantable Lead Model: 4598
Implantable Lead Model: 5076
Implantable Pulse Generator Implant Date: 20260102

## 2024-07-14 NOTE — Patient Instructions (Addendum)

## 2024-07-14 NOTE — Progress Notes (Signed)
 Normal multi chamber ICD wound check. Wound well healed. Small hematoma noted directly over device. Requested RU, PA-C in clinic to assess and they determined that a pressure bandage was not necessary at this time. Presenting rhythm: AS/VP. Routine testing performed. Thresholds, sensing, and impedance consistent with implant measurements with 3.5V safety margin/auto capture until 3 month visit. No treated arrhythmias. Reviewed arm restrictions to continue for 6 weeks total post op. Reviewed shock plan.  Pt enrolled in remote follow-up.  LV pulse width previously programmed at 0.8 mS for unknown reasons to this RN. LV threshold measured 0.75 V @ 0.8 mS today. This RN will leave the previously programmed setting until the patient returns for his 91 post-op check.  Of note, the LV was tested at lower pulse widths and was still in good range (1.0 V @ 0.5 mS, and 1.25 V @ 0.4 mS)  No remote transmission received from patient since implant and device marked as disconnected in [facility]. Discussed this issue with the patient and his wife. Phone numbers given to patient and wife for the [employer] patient and tech support services to help patient reconnect for remote monitoring.

## 2024-07-15 NOTE — Telephone Encounter (Signed)
 Called pt left detailed vm for him to make sure monitor is plugged in, to press button for two seconds and release, sit near it until finished. Left DC direct number for pt to call back

## 2024-07-20 ENCOUNTER — Ambulatory Visit: Payer: Self-pay | Admitting: Cardiology

## 2024-07-23 ENCOUNTER — Observation Stay (HOSPITAL_COMMUNITY)

## 2024-07-23 ENCOUNTER — Other Ambulatory Visit: Payer: Self-pay | Admitting: Home Health

## 2024-07-23 ENCOUNTER — Other Ambulatory Visit: Payer: Self-pay

## 2024-07-23 ENCOUNTER — Encounter (HOSPITAL_COMMUNITY): Payer: Self-pay

## 2024-07-23 ENCOUNTER — Emergency Department (HOSPITAL_COMMUNITY)

## 2024-07-23 ENCOUNTER — Observation Stay (HOSPITAL_COMMUNITY)
Admission: EM | Admit: 2024-07-23 | Discharge: 2024-07-24 | Disposition: A | Discharge status: Home / Self Care | Attending: Emergency Medicine | Admitting: Emergency Medicine

## 2024-07-23 DIAGNOSIS — I5043 Acute on chronic combined systolic (congestive) and diastolic (congestive) heart failure: Secondary | ICD-10-CM | POA: Diagnosis not present

## 2024-07-23 DIAGNOSIS — N184 Chronic kidney disease, stage 4 (severe): Secondary | ICD-10-CM | POA: Diagnosis not present

## 2024-07-23 DIAGNOSIS — Z8673 Personal history of transient ischemic attack (TIA), and cerebral infarction without residual deficits: Secondary | ICD-10-CM | POA: Diagnosis not present

## 2024-07-23 DIAGNOSIS — Z96642 Presence of left artificial hip joint: Secondary | ICD-10-CM | POA: Diagnosis not present

## 2024-07-23 DIAGNOSIS — E1122 Type 2 diabetes mellitus with diabetic chronic kidney disease: Secondary | ICD-10-CM | POA: Diagnosis not present

## 2024-07-23 DIAGNOSIS — Z95810 Presence of automatic (implantable) cardiac defibrillator: Secondary | ICD-10-CM | POA: Diagnosis not present

## 2024-07-23 DIAGNOSIS — I5023 Acute on chronic systolic (congestive) heart failure: Secondary | ICD-10-CM

## 2024-07-23 DIAGNOSIS — I2081 Angina pectoris with coronary microvascular dysfunction: Secondary | ICD-10-CM | POA: Diagnosis not present

## 2024-07-23 DIAGNOSIS — G629 Polyneuropathy, unspecified: Secondary | ICD-10-CM | POA: Insufficient documentation

## 2024-07-23 DIAGNOSIS — J810 Acute pulmonary edema: Secondary | ICD-10-CM | POA: Diagnosis not present

## 2024-07-23 DIAGNOSIS — E785 Hyperlipidemia, unspecified: Secondary | ICD-10-CM | POA: Insufficient documentation

## 2024-07-23 DIAGNOSIS — R072 Precordial pain: Principal | ICD-10-CM

## 2024-07-23 DIAGNOSIS — N179 Acute kidney failure, unspecified: Secondary | ICD-10-CM | POA: Insufficient documentation

## 2024-07-23 DIAGNOSIS — Z7984 Long term (current) use of oral hypoglycemic drugs: Secondary | ICD-10-CM | POA: Insufficient documentation

## 2024-07-23 DIAGNOSIS — Z7982 Long term (current) use of aspirin: Secondary | ICD-10-CM | POA: Diagnosis not present

## 2024-07-23 DIAGNOSIS — Z950 Presence of cardiac pacemaker: Secondary | ICD-10-CM

## 2024-07-23 DIAGNOSIS — K219 Gastro-esophageal reflux disease without esophagitis: Secondary | ICD-10-CM | POA: Insufficient documentation

## 2024-07-23 DIAGNOSIS — R079 Chest pain, unspecified: Principal | ICD-10-CM | POA: Insufficient documentation

## 2024-07-23 DIAGNOSIS — N183 Chronic kidney disease, stage 3 unspecified: Secondary | ICD-10-CM | POA: Diagnosis present

## 2024-07-23 DIAGNOSIS — I130 Hypertensive heart and chronic kidney disease with heart failure and stage 1 through stage 4 chronic kidney disease, or unspecified chronic kidney disease: Secondary | ICD-10-CM | POA: Diagnosis not present

## 2024-07-23 DIAGNOSIS — Z79899 Other long term (current) drug therapy: Secondary | ICD-10-CM | POA: Diagnosis not present

## 2024-07-23 DIAGNOSIS — I10 Essential (primary) hypertension: Secondary | ICD-10-CM | POA: Diagnosis present

## 2024-07-23 DIAGNOSIS — E119 Type 2 diabetes mellitus without complications: Secondary | ICD-10-CM

## 2024-07-23 DIAGNOSIS — R7989 Other specified abnormal findings of blood chemistry: Secondary | ICD-10-CM

## 2024-07-23 DIAGNOSIS — E611 Iron deficiency: Secondary | ICD-10-CM | POA: Insufficient documentation

## 2024-07-23 LAB — BASIC METABOLIC PANEL WITH GFR
Anion gap: 12 (ref 5–15)
BUN: 74 mg/dL — ABNORMAL HIGH (ref 8–23)
CO2: 30 mmol/L (ref 22–32)
Calcium: 8.6 mg/dL — ABNORMAL LOW (ref 8.9–10.3)
Chloride: 95 mmol/L — ABNORMAL LOW (ref 98–111)
Creatinine, Ser: 3.03 mg/dL — ABNORMAL HIGH (ref 0.61–1.24)
GFR, Estimated: 21 mL/min — ABNORMAL LOW
Glucose, Bld: 160 mg/dL — ABNORMAL HIGH (ref 70–99)
Potassium: 4.7 mmol/L (ref 3.5–5.1)
Sodium: 137 mmol/L (ref 135–145)

## 2024-07-23 LAB — CBC
HCT: 34.8 % — ABNORMAL LOW (ref 39.0–52.0)
Hemoglobin: 10.9 g/dL — ABNORMAL LOW (ref 13.0–17.0)
MCH: 28.7 pg (ref 26.0–34.0)
MCHC: 31.3 g/dL (ref 30.0–36.0)
MCV: 91.6 fL (ref 80.0–100.0)
Platelets: 273 K/uL (ref 150–400)
RBC: 3.8 MIL/uL — ABNORMAL LOW (ref 4.22–5.81)
RDW: 15 % (ref 11.5–15.5)
WBC: 8 K/uL (ref 4.0–10.5)
nRBC: 0 % (ref 0.0–0.2)

## 2024-07-23 LAB — TROPONIN T, HIGH SENSITIVITY
Troponin T High Sensitivity: 54 ng/L — ABNORMAL HIGH (ref 0–19)
Troponin T High Sensitivity: 53 ng/L — ABNORMAL HIGH (ref 0–19)

## 2024-07-23 LAB — PROTIME-INR
INR: 1 (ref 0.8–1.2)
Prothrombin Time: 13.6 s (ref 11.4–15.2)

## 2024-07-23 LAB — PRO BRAIN NATRIURETIC PEPTIDE: Pro Brain Natriuretic Peptide: 1028 pg/mL — ABNORMAL HIGH

## 2024-07-23 LAB — MAGNESIUM: Magnesium: 2.3 mg/dL (ref 1.7–2.4)

## 2024-07-23 LAB — PHOSPHORUS: Phosphorus: 4.6 mg/dL (ref 2.5–4.6)

## 2024-07-23 MED ORDER — FUROSEMIDE 10 MG/ML IJ SOLN
40.0000 mg | Freq: Once | INTRAMUSCULAR | Status: AC
  Administered 2024-07-23: 40 mg via INTRAVENOUS
  Filled 2024-07-23: qty 4

## 2024-07-23 MED ORDER — HYDRALAZINE HCL 20 MG/ML IJ SOLN: 10.0000 mg | INTRAMUSCULAR | Status: DC | PRN

## 2024-07-23 MED ORDER — ALUM & MAG HYDROXIDE-SIMETH 200-200-20 MG/5ML PO SUSP: 30.0000 mL | Freq: Once | ORAL | Status: DC

## 2024-07-23 MED ORDER — DAPAGLIFLOZIN PROPANEDIOL 10 MG PO TABS
10.0000 mg | ORAL_TABLET | Freq: Every day | ORAL | Status: DC
  Administered 2024-07-24: 10 mg via ORAL
  Filled 2024-07-23: qty 1

## 2024-07-23 MED ORDER — CARVEDILOL 12.5 MG PO TABS
12.5000 mg | ORAL_TABLET | Freq: Two times a day (BID) | ORAL | Status: DC
  Administered 2024-07-23 – 2024-07-24 (×2): 12.5 mg via ORAL
  Filled 2024-07-23 (×2): qty 1

## 2024-07-23 MED ORDER — SODIUM CHLORIDE 0.9% FLUSH
3.0000 mL | Freq: Two times a day (BID) | INTRAVENOUS | Status: DC
  Administered 2024-07-23 – 2024-07-24 (×2): 3 mL via INTRAVENOUS

## 2024-07-23 MED ORDER — FUROSEMIDE 10 MG/ML IJ SOLN: 40.0000 mg | Freq: Two times a day (BID) | INTRAMUSCULAR | Status: DC

## 2024-07-23 MED ORDER — SPIRONOLACTONE 12.5 MG HALF TABLET
12.5000 mg | ORAL_TABLET | Freq: Every day | ORAL | Status: DC
  Administered 2024-07-24: 12.5 mg via ORAL
  Filled 2024-07-23: qty 1

## 2024-07-23 MED ORDER — FERROUS SULFATE 325 (65 FE) MG PO TABS
325.0000 mg | ORAL_TABLET | Freq: Every day | ORAL | Status: DC
  Administered 2024-07-24: 325 mg via ORAL
  Filled 2024-07-23: qty 1

## 2024-07-23 MED ORDER — IPRATROPIUM BROMIDE 0.02 % IN SOLN: 0.5000 mg | Freq: Four times a day (QID) | RESPIRATORY_TRACT | Status: DC | PRN

## 2024-07-23 MED ORDER — TRAZODONE HCL 50 MG PO TABS: 25.0000 mg | ORAL_TABLET | Freq: Every evening | ORAL | Status: DC | PRN

## 2024-07-23 MED ORDER — GABAPENTIN 300 MG PO CAPS
300.0000 mg | ORAL_CAPSULE | Freq: Two times a day (BID) | ORAL | Status: DC
  Administered 2024-07-23 – 2024-07-24 (×2): 300 mg via ORAL
  Filled 2024-07-23 (×2): qty 1

## 2024-07-23 MED ORDER — PANTOPRAZOLE SODIUM 40 MG PO TBEC
40.0000 mg | DELAYED_RELEASE_TABLET | Freq: Every day | ORAL | Status: DC
  Administered 2024-07-24: 40 mg via ORAL
  Filled 2024-07-23: qty 1

## 2024-07-23 MED ORDER — TAMSULOSIN HCL 0.4 MG PO CAPS
0.4000 mg | ORAL_CAPSULE | Freq: Every day | ORAL | Status: DC
  Administered 2024-07-24: 0.4 mg via ORAL
  Filled 2024-07-23: qty 1

## 2024-07-23 MED ORDER — SENNOSIDES-DOCUSATE SODIUM 8.6-50 MG PO TABS: 1.0000 | ORAL_TABLET | Freq: Every evening | ORAL | Status: DC | PRN

## 2024-07-23 MED ORDER — HEPARIN SODIUM (PORCINE) 5000 UNIT/ML IJ SOLN
5000.0000 [IU] | Freq: Three times a day (TID) | INTRAMUSCULAR | Status: DC
  Administered 2024-07-23 – 2024-07-24 (×2): 5000 [IU] via SUBCUTANEOUS
  Filled 2024-07-23 (×2): qty 1

## 2024-07-23 MED ORDER — ONDANSETRON HCL 4 MG/2ML IJ SOLN: 4.0000 mg | Freq: Four times a day (QID) | INTRAMUSCULAR | Status: DC | PRN

## 2024-07-23 MED ORDER — FLEET ENEMA RE ENEM: 1.0000 | ENEMA | Freq: Once | RECTAL | Status: DC | PRN

## 2024-07-23 MED ORDER — ALPRAZOLAM 0.25 MG PO TABS: 0.2500 mg | ORAL_TABLET | Freq: Two times a day (BID) | ORAL | Status: DC | PRN

## 2024-07-23 MED ORDER — METOLAZONE 2.5 MG PO TABS: 2.5000 mg | ORAL_TABLET | ORAL | Status: DC

## 2024-07-23 MED ORDER — FUROSEMIDE 10 MG/ML IJ SOLN
120.0000 mg | Freq: Once | INTRAVENOUS | Status: AC
  Administered 2024-07-23: 120 mg via INTRAVENOUS
  Filled 2024-07-23: qty 10

## 2024-07-23 MED ORDER — ACETAMINOPHEN 500 MG PO TABS
500.0000 mg | ORAL_TABLET | Freq: Four times a day (QID) | ORAL | Status: DC | PRN
  Administered 2024-07-24: 1000 mg via ORAL
  Filled 2024-07-23: qty 2

## 2024-07-23 MED ORDER — HYDROMORPHONE HCL 1 MG/ML IJ SOLN: 0.5000 mg | INTRAMUSCULAR | Status: DC | PRN

## 2024-07-23 MED ORDER — SODIUM CHLORIDE 0.9% FLUSH
3.0000 mL | Freq: Two times a day (BID) | INTRAVENOUS | Status: DC
  Administered 2024-07-24: 3 mL via INTRAVENOUS

## 2024-07-23 MED ORDER — ASPIRIN 81 MG PO CHEW
81.0000 mg | CHEWABLE_TABLET | Freq: Every day | ORAL | Status: DC
  Administered 2024-07-23 – 2024-07-24 (×2): 81 mg via ORAL
  Filled 2024-07-23 (×2): qty 1

## 2024-07-23 MED ORDER — SENNOSIDES-DOCUSATE SODIUM 8.6-50 MG PO TABS
1.0000 | ORAL_TABLET | Freq: Every day | ORAL | Status: DC
  Administered 2024-07-23: 1 via ORAL
  Filled 2024-07-23: qty 1

## 2024-07-23 MED ORDER — BISACODYL 5 MG PO TBEC: 5.0000 mg | DELAYED_RELEASE_TABLET | Freq: Every day | ORAL | Status: DC | PRN

## 2024-07-23 MED ORDER — FINASTERIDE 5 MG PO TABS
5.0000 mg | ORAL_TABLET | Freq: Every day | ORAL | Status: DC
  Administered 2024-07-24: 5 mg via ORAL
  Filled 2024-07-23: qty 1

## 2024-07-23 MED ORDER — ATORVASTATIN CALCIUM 40 MG PO TABS
40.0000 mg | ORAL_TABLET | Freq: Every evening | ORAL | Status: DC
  Administered 2024-07-23: 40 mg via ORAL
  Filled 2024-07-23: qty 1

## 2024-07-23 NOTE — ED Triage Notes (Signed)
 Pt states 25 min ago pacemaker made a weird sound and pt was advised to come to ER. Denies pacemaker shocking pt/ C/O SHOB and CP around pacemaker. Axox4. Pts abd looks swollen on assessment. Denies nausea/ vomiting.

## 2024-07-23 NOTE — Assessment & Plan Note (Signed)
-   Checking CBG q. ACHS, SSI coverage -Continue Farxiga , holding glipizide

## 2024-07-23 NOTE — ED Provider Notes (Addendum)
 " [facility] Provider Note   CSN: [id number] Arrival date & time: 07/23/24  9168     Patient presents with: Pacemaker Problem   [name] is a 74 y.o. male.   Pt with hx CM/CHF, c/o having defib device placed in past few weeks and indicates last night at rest, felt sensation of being kicked in mid chest - lasted a few seconds, no associated  faintness or dizziness, no associated other chest discomfort or sob or unusual doe, no palpitations or sense of rapid, slow or irregular beating. No hx same sensation. States this AM, his monitor at home was alarming, like a siren, and so came to ED. Indicates this AM did have mild vague mid chest discomfort but indicates feels fine now, no chest pain or sob.   The history is provided by the spouse, the patient and medical records.       Prior to Admission medications  Medication Sig Start Date End Date Taking? Authorizing Provider  acetaminophen  (TYLENOL ) 500 MG tablet Take 500-1,000 mg by mouth every 6 (six) hours as needed for moderate pain (pain score 4-6).    [provider]  ASPIRIN  81 PO Take 81 mg by mouth daily.    [provider]  atorvastatin  (LIPITOR) 40 MG tablet Take 1 tablet (40 mg total) by mouth every evening. 08/05/19   Rolan Ezra RAMAN, MD  carvedilol  (COREG ) 12.5 MG tablet TAKE ONE TABLET BY MOUTH TWICE A DAY 02/29/20   Rolan Ezra RAMAN, MD  dapagliflozin  propanediol (FARXIGA ) 10 MG TABS tablet Take 1 tablet (10 mg total) by mouth daily before breakfast. 11/12/23   Rolan Ezra RAMAN, MD  finasteride  (PROSCAR ) 5 MG tablet Take 5 mg by mouth daily.    [provider]  fluticasone  (FLONASE) 50 MCG/ACT nasal spray Place 1 spray into both nostrils daily as needed for allergies or rhinitis.    [provider]  gabapentin  (NEURONTIN ) 300 MG capsule Take 300 mg by mouth 2 (two) times daily.    [provider]  glipiZIDE  (GLUCOTROL  XL) 10 MG 24 hr  tablet Take 10 mg by mouth daily. 09/11/23   [provider]  Iron  Combinations (IRON  COMPLEX PO) Take 1 tablet by mouth daily.    [provider]  metolazone  (ZAROXOLYN ) 2.5 MG tablet Take 1 tablet (2.5 mg total) by mouth once a week. Patient taking differently: Take 2.5 mg by mouth every Sunday. 11/13/23   Rolan Ezra RAMAN, MD  montelukast  (SINGULAIR ) 10 MG tablet Take 10 mg by mouth daily.     [provider]  Multiple Vitamin (MULTIVITAMIN) tablet Take 1 tablet by mouth daily.    [provider]  pantoprazole  (PROTONIX ) 40 MG tablet Take 1 tablet (40 mg total) by mouth daily. 10/11/13   Theophilus Andrews, Tully GRADE, MD  potassium chloride  SA (KLOR-CON  M20) 20 MEQ tablet Take 2 tablets (40 mEq total) by mouth daily. Patient taking differently: Take 40 mEq by mouth See admin instructions. Take 40 mEq by mouth daily and take 20 mEq on Sundays with metolazone . 12/16/23   Rolan Ezra RAMAN, MD  spironolactone  (ALDACTONE ) 25 MG tablet Take 1 tablet (25 mg total) by mouth daily. 12/13/23   McLean, Dalton S, MD  SYMBICORT 80-4.5 MCG/ACT inhaler Inhale 2 puffs into the lungs daily as needed (wheezing). 08/28/19   [provider]  tamsulosin  (FLOMAX ) 0.4 MG CAPS capsule Take 0.4 mg by mouth daily. 05/03/15   [provider]  torsemide  (DEMADEX ) 100 MG tablet Take 1 tablet (100 mg total) by mouth 2 (two) times daily. 09/17/23   Glena Harlene HERO, FNP  VENTOLIN  HFA 108 (90 Base) MCG/ACT inhaler Inhale 2 puffs into the lungs every 6 (six) hours as needed for wheezing or shortness of breath.  08/19/19   [provider]    Allergies: Cashew nut oil and Percocet [oxycodone -acetaminophen ]    Review of Systems  Constitutional:  Negative for fever.  HENT:  Negative for sore throat.   Respiratory:  Negative for cough and shortness of breath.   Cardiovascular:  Negative for palpitations and leg swelling.  Gastrointestinal:  Negative for abdominal pain, nausea  and vomiting.  Genitourinary:  Negative for flank pain.  Musculoskeletal:  Negative for back pain and neck pain.  Neurological:  Negative for syncope, speech difficulty, weakness, numbness and headaches.    Updated Vital Signs BP 126/85   Pulse 72   Temp 98.7 F (37.1 C) (Oral)   Resp 18   Ht 1.702 m (5' 7)   Wt 90 kg   SpO2 100%   BMI 31.08 kg/m   Physical Exam Vitals and nursing note reviewed.  Constitutional:      Appearance: Normal appearance. He is well-developed.  HENT:     Head: Atraumatic.     Nose: Nose normal.     Mouth/Throat:     Mouth: Mucous membranes are moist.     Pharynx: Oropharynx is clear.  Eyes:     General: No scleral icterus.    Conjunctiva/sclera: Conjunctivae normal.     Pupils: Pupils are equal, round, and reactive to light.  Neck:     Trachea: No tracheal deviation.  Cardiovascular:     Rate and Rhythm: Normal rate and regular rhythm.     Pulses: Normal pulses.     Heart sounds: Normal heart sounds. No murmur heard.    No friction rub. No gallop.  Pulmonary:     Effort: Pulmonary effort is normal. No accessory muscle usage or respiratory distress.     Breath sounds: Normal breath sounds.     Comments: Defib/device site left upper chest without sign of infection. Chest:     Chest wall: No tenderness.  Abdominal:     General: Bowel sounds are normal. There is no distension.     Palpations: Abdomen is soft.     Tenderness: There is no abdominal tenderness.  Genitourinary:    Comments: No cva tenderness. Musculoskeletal:        General: No swelling or tenderness.     Cervical back: Normal range of motion and neck supple. No rigidity.  Skin:    General: Skin is warm and dry.     Findings: No rash.  Neurological:     Mental Status: He is alert.     Comments: Alert, speech clear.   Psychiatric:        Mood and Affect: Mood normal.     (all labs ordered are listed, but only abnormal results are displayed) Results for orders placed  or performed during the hospital encounter of 07/23/24  Basic metabolic panel   Collection Time: 07/23/24  8:45 AM  Result Value Ref Range   Sodium 137 135 - 145 mmol/L   Potassium 4.7 3.5 - 5.1 mmol/L   Chloride 95 (L) 98 - 111 mmol/L   CO2 30 22 - 32 mmol/L   Glucose, Bld 160 (H) 70 - 99 mg/dL   BUN 74 (H) 8 - 23 mg/dL  Creatinine, Ser 3.03 (H) 0.61 - 1.24 mg/dL   Calcium  8.6 (L) 8.9 - 10.3 mg/dL   GFR, Estimated 21 (L) >60 mL/min   Anion gap 12 5 - 15  CBC   Collection Time: 07/23/24  8:45 AM  Result Value Ref Range   WBC 8.0 4.0 - 10.5 K/uL   RBC 3.80 (L) 4.22 - 5.81 MIL/uL   Hemoglobin 10.9 (L) 13.0 - 17.0 g/dL   HCT 65.1 (L) 60.9 - 47.9 %   MCV 91.6 80.0 - 100.0 fL   MCH 28.7 26.0 - 34.0 pg   MCHC 31.3 30.0 - 36.0 g/dL   RDW 84.9 88.4 - 84.4 %   Platelets 273 150 - 400 K/uL   nRBC 0.0 0.0 - 0.2 %  Troponin T, High Sensitivity   Collection Time: 07/23/24  8:45 AM  Result Value Ref Range   Troponin T High Sensitivity 54 (H) 0 - 19 ng/L  Pro Brain natriuretic peptide   Collection Time: 07/23/24 10:45 AM  Result Value Ref Range   Pro Brain Natriuretic Peptide 1,028.0 (H) <300.0 pg/mL  Troponin T, High Sensitivity   Collection Time: 07/23/24 10:45 AM  Result Value Ref Range   Troponin T High Sensitivity 53 (H) 0 - 19 ng/L   DG Chest 2 View Result Date: 07/23/2024 EXAM: 2 VIEW(S) XRAY OF THE CHEST 07/23/2024 09:11:28 AM COMPARISON: 07/03/2024 CLINICAL HISTORY: Chest pain. FINDINGS: LINES, TUBES AND DEVICES: Left chest wall cardiac pacemaker/aicd with leads terminating in the right atrium, right ventricle, and coronary sinus . LUNGS AND PLEURA: Stable bibasilar linear opacities, likely atelectasis or scarring. Central pulmonary vascular congestion with bilateral perihilar and suture opacities. No pleural effusion. No pneumothorax. HEART AND MEDIASTINUM: Cardiomegaly, unchanged. Pulmonary artery pressure device noted (CardioMEMS) in the left lower PA. BONES AND SOFT TISSUES:  Multilevel degenerative changes of thoracic spine. No acute osseous abnormality. IMPRESSION: 1. Cardiomegaly with findings of mild interstitial edema . Electronically signed by: Rogelia Myers MD 07/23/2024 09:18 AM EST RP Workstation: [id number]   CUP PACEART INCLINIC DEVICE CHECK Result Date: 07/14/2024 Normal multi chamber ICD wound check. Wound well healed. Small hematoma noted directly over device. Requested RU, PA-C in clinic to assess and they determined that a pressure bandage was not necessary at this time. Presenting rhythm: AS/VP. Routine testing performed. Thresholds, sensing, and impedance consistent with implant measurements with 3.5V safety margin/auto capture until 3 month visit. No treated arrhythmias. Reviewed arm restrictions to continue for 6 weeks total post op. Reviewed shock plan.  Pt enrolled in remote follow-up. LV pulse width previously programmed at 0.8 mS for unknown reasons to this RN. LV threshold measured 0.75 V @ 0.8 mS today. This RN will leave the previously programmed setting until the patient returns for his 89 post-op check.  Of note, the LV was tested at lower pulse widths and was still in good range (1.0 V @ 0.5 mS, and 1.25 V @ 0.4 mS) No remote transmission received from patient since implant and device marked as disconnected in [facility]. Discussed this issue with the patient and his wife. Phone numbers given to patient and wife for the [employer] patient and tech support services to  help patient reconnect for remote monitoring.Bard Silvan, BSN, RN  DG Chest 2 View Result Date: 07/03/2024 CLINICAL DATA:  Defibrillator placement EXAM: CHEST - 2 VIEW COMPARISON:  June 14, 2020 FINDINGS: Stable cardiomediastinal silhouette. Interval placement of left-sided defibrillator with leads in grossly good position. No definite pneumothorax is noted. Minimal bibasilar subsegmental  atelectasis or scarring is noted. IMPRESSION: Interval placement of left-sided defibrillator.  Minimal bibasilar subsegmental atelectasis or scarring. Electronically Signed   By: Lynwood Landy Raddle M.D.   On: 07/03/2024 16:03   EP PPM/ICD IMPLANT Result Date: 07/03/2024 Conclusion: Successful insertion of a [employer] biventricular ICD in a patient with chronic systolic heart failure, left bundle branch block, ejection fraction 25%. Danelle Birmingham, MD     EKG: EKG Interpretation Date/Time:  Thursday July 23 2024 08:42:44 EST Ventricular Rate:  69 PR Interval:  158 QRS Duration:  136 QT Interval:  472 QTC Calculation: 505 R Axis:   34  Text Interpretation: Atrial-sensed ventricular-paced rhythm No significant change since last tracing Confirmed by Bernard Drivers ([id number]) on 07/23/2024 9:19:11 AM  Radiology: [facility] Chest 2 View Result Date: 07/23/2024 EXAM: 2 VIEW(S) XRAY OF THE CHEST 07/23/2024 09:11:28 AM COMPARISON: 07/03/2024 CLINICAL HISTORY: Chest pain. FINDINGS: LINES, TUBES AND DEVICES: Left chest wall cardiac pacemaker/aicd with leads terminating in the right atrium, right ventricle, and coronary sinus . LUNGS AND PLEURA: Stable bibasilar linear opacities, likely atelectasis or scarring. Central pulmonary vascular congestion with bilateral perihilar and suture opacities. No pleural effusion. No pneumothorax. HEART AND MEDIASTINUM: Cardiomegaly, unchanged. Pulmonary artery pressure device noted (CardioMEMS) in the left lower PA. BONES AND SOFT TISSUES: Multilevel degenerative changes of thoracic spine. No acute osseous abnormality. IMPRESSION: 1. Cardiomegaly with findings of mild interstitial edema . Electronically signed by: Rogelia Myers MD 07/23/2024 09:18 AM EST RP Workstation: [id number]     Procedures   Medications Ordered in the ED  furosemide  (LASIX ) injection 40 mg (has no administration in time range)                                    Medical Decision Making Problems Addressed: Acute on chronic combined systolic and diastolic CHF (congestive heart failure) (HCC):  acute illness or injury with systemic symptoms that poses a threat to life or bodily functions AKI (acute kidney injury): acute illness or injury Elevated brain natriuretic peptide (BNP) level: acute illness or injury History of cardiac pacemaker: chronic illness or injury History of implantable cardiac defibrillator (ICD): chronic illness or injury Precordial chest pain: acute illness or injury with systemic symptoms that poses a threat to life or bodily functions Stage 4 chronic kidney disease (HCC): chronic illness or injury with exacerbation, progression, or side effects of treatment that poses a threat to life or bodily functions  Amount and/or Complexity of Data Reviewed Independent Historian: spouse    Details: hx External Data Reviewed: notes. Labs: ordered. Decision-making details documented in ED Course. Radiology: ordered and independent interpretation performed. Decision-making details documented in ED Course. Discussion of management or test interpretation with external provider(s): medicine  Risk Prescription drug management. Decision regarding hospitalization.   Iv ns. Continuous pulse ox and cardiac monitoring. Labs ordered/sent. Imaging ordered.   Differential diagnosis includes vt, other dysrhythmia, device issue, etc. Dispo decision including potential need for admission considered - will get labs and imaging and reassess.   Reviewed nursing notes and prior charts for additional history. External reports reviewed. Additional history from: spouse.   Cardiac monitor: sinus rhythm, rate 70.\  Will ask for device to be interrogated.   Labs reviewed/interpreted by me - chem w mild aki on ckd. K normal. Trop mildly elevated but stable, no increasing, no current chest pain and hx significant ckd. Bnp is high. Lasix  dose iv.   Xrays  reviewed/interpreted by me - vascular congestion.   Recheck, no chest pain. Awaiting device interrogation.  Given recent chest pain, aki  on ckd, elev bnp/edema, will admit to medicine. Pt/spouse indicates does not have a current pcp - unassigned medicine consulted. Discussed w Dr Leanne - will admit. Requests cardiology consult - cardiology consulted.  Discussed pt with cardiology/Trish - they will see in consult.   Signed out to oncoming EDP that cardiology consult and device interrogation also pending.   CRITICAL CARE RE: CHF/pulm edema/elevated bnp w parenteral diuretic therapy, r/o defib firing/dysrhythmia.  Performed by: Beyonca Wisz E Alanis Clift Total critical care time: 40 minutes Critical care time was exclusive of separately billable procedures and treating other patients. Critical care was necessary to treat or prevent imminent or life-threatening deterioration. Critical care was time spent personally by me on the following activities: development of treatment plan with patient and/or surrogate as well as nursing, discussions with consultants, evaluation of patient's response to treatment, examination of patient, obtaining history from patient or surrogate, ordering and performing treatments and interventions, ordering and review of laboratory studies, ordering and review of radiographic studies, pulse oximetry and re-evaluation of patient's condition.         Final diagnoses:  None    ED Discharge Orders     None          Bernard Drivers, MD 07/23/24 1658  "

## 2024-07-23 NOTE — Assessment & Plan Note (Signed)
-   Continue iron  supplements

## 2024-07-23 NOTE — Assessment & Plan Note (Signed)
 Not in respite distress or failure, congestions prominent on x-ray -Continue diuretics -Supplemental oxygen as needed.

## 2024-07-23 NOTE — H&P (Signed)
 " History and Physical   Patient: [name]                            PCP: Leontine Cramp, NP                    DOB: 29-Aug-1950            DOA: 07/23/2024 FMW:[id number]             DOS: 07/23/2024, 6:18 PM  Leontine Cramp, NP  Patient coming from:   HOME  I have personally reviewed patient's medical records, in electronic medical records, including:  [place] link, and care everywhere.    Chief Complaint:   Chief Complaint  Patient presents with   Pacemaker Problem    History of present illness:     [name] is a 74 year old male with significant history of HTN, HLD, DM II, chronic iron  deficiency, GERD, BPH, [name], LBBB, HFrEF S/P placement of ICD/defibrillator insertion 07/03/24, h/o PEA arrest in the past. Presenting with chief complaint of chest pain, chest discomfort believe his defibrillator fired. Patient reports started having some chest discomfort last night at rest, felt sensation of being kicked in the chest, lasted few seconds.  It was not associated with any shortness of breath, progressive chest pain, dizziness, nausea or vomiting.   ED Evaluation: Blood pressure 126/85, pulse 72, temperature 98.7 F (37.1 C), temperature source Oral, resp. rate 18, height 5' 7 (1.702 m), weight 90 kg, SpO2 100%.  LABs: Chloride 95, glucose 160, BUN 74 creatinine 3.03, calcium  8.6, BNP 1,028.0, troponin 54, 53, WBC 8.0, hemoglobin 10.9,  Patient received 40 mg IV Lasix . EDP Dr. Bernard reaching to [employer] tech to interrogate pacemaker/defibrillator, also cardiology for evaluation  Requested patient to be admitted for close monitoring and needing diuretics and a on acute or [name]       Patient Denies having: Fever, Chills, Cough, SOB, Abd pain, N/V/D, headache, dizziness, lightheadedness,  Dysuria, Joint pain, rash, open wounds    Review of Systems: As per HPI, otherwise 10 point review of systems were negative.    ----------------------------------------------------------------------------------------------------------------------  Allergies[1]  Home MEDs:  Prior to Admission medications  Medication Sig Start Date End Date Taking? Authorizing Provider  Potassium Chloride  ER 20 MEQ TBCR Take 1 tablet by mouth 2 (two) times daily. 07/15/24  Yes [provider]  acetaminophen  (TYLENOL ) 500 MG tablet Take 500-1,000 mg by mouth every 6 (six) hours as needed for moderate pain (pain score 4-6).    [provider]  ASPIRIN  81 PO Take 81 mg by mouth daily.    [provider]  atorvastatin  (LIPITOR) 40 MG tablet Take 1 tablet (40 mg total) by mouth every evening. 08/05/19   Rolan Ezra RAMAN, MD  carvedilol  (COREG ) 12.5 MG tablet TAKE ONE TABLET BY MOUTH TWICE A DAY 02/29/20   Rolan Ezra RAMAN, MD  dapagliflozin  propanediol (FARXIGA ) 10 MG TABS tablet Take 1 tablet (10 mg total) by mouth daily before breakfast. 11/12/23   Rolan Ezra RAMAN, MD  finasteride  (PROSCAR ) 5 MG tablet Take 5 mg by mouth daily.    [provider]  fluticasone  (FLONASE) 50 MCG/ACT nasal spray Place 1 spray into both nostrils daily as needed for allergies or rhinitis.    [provider]  gabapentin  (NEURONTIN ) 300 MG capsule Take 300 mg by mouth 2 (two) times daily.    [provider]  glipiZIDE  (GLUCOTROL  XL) 10  MG 24 hr tablet Take 10 mg by mouth daily. 09/11/23   [provider]  Iron  Combinations (IRON  COMPLEX PO) Take 1 tablet by mouth daily.    [provider]  metolazone  (ZAROXOLYN ) 2.5 MG tablet Take 1 tablet (2.5 mg total) by mouth once a week. Patient taking differently: Take 2.5 mg by mouth every Sunday. 11/13/23   Rolan Ezra RAMAN, MD  montelukast  (SINGULAIR ) 10 MG tablet Take 10 mg by mouth daily.     [provider]  Multiple Vitamin (MULTIVITAMIN) tablet Take 1 tablet by mouth daily.    [provider]  pantoprazole  (PROTONIX ) 40 MG tablet  Take 1 tablet (40 mg total) by mouth daily. 10/11/13   Theophilus Andrews, Tully GRADE, MD  potassium chloride  SA (KLOR-CON  M20) 20 MEQ tablet Take 2 tablets (40 mEq total) by mouth daily. Patient taking differently: Take 40 mEq by mouth See admin instructions. Take 40 mEq by mouth daily and take 20 mEq on Sundays with metolazone . 12/16/23   Rolan Ezra RAMAN, MD  spironolactone  (ALDACTONE ) 25 MG tablet Take 1 tablet (25 mg total) by mouth daily. 12/13/23   McLean, Dalton S, MD  SYMBICORT 80-4.5 MCG/ACT inhaler Inhale 2 puffs into the lungs daily as needed (wheezing). 08/28/19   [provider]  tamsulosin  (FLOMAX ) 0.4 MG CAPS capsule Take 0.4 mg by mouth daily. 05/03/15   [provider]  torsemide  (DEMADEX ) 100 MG tablet Take 1 tablet (100 mg total) by mouth 2 (two) times daily. 09/17/23   Milford, Harlene HERO, FNP  VENTOLIN  HFA 108 (90 Base) MCG/ACT inhaler Inhale 2 puffs into the lungs every 6 (six) hours as needed for wheezing or shortness of breath.  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   wife states blocked 30%   CHF (congestive heart failure) (HCC)    Chronic kidney disease    Diabetes mellitus 10/17/2011   newly dx today   Edema leg    right leg has leaky valve and right foot swells   Emphysema    Excessive ear wax    Fatty liver    Hernia    near navel   Hyperlipidemia    Hypertension    Leaky heart valve    Neuropathy    Rheumatic fever    Sleep apnea    Slow urinary stream    TIA (transient ischemic attack)     Past Surgical History:  Procedure Laterality Date   BIV ICD INSERTION CRT-D N/A 07/03/2024   Procedure: BIV ICD INSERTION CRT-D;  Surgeon: Waddell Danelle ORN, MD;  Location: [facility] INVASIVE CV LAB;  Service: Cardiovascular;  Laterality:  N/A;   CIRCUMCISION     LEAD INSERTION N/A 07/03/2024   Procedure: LEAD INSERTION;  Surgeon: Waddell Danelle ORN, MD;  Location: [facility] INVASIVE CV LAB;  Service: Cardiovascular;  Laterality: N/A;   LITHOTRIPSY     PRESSURE SENSOR/CARDIOMEMS N/A 05/27/2019   Procedure: PRESSURE SENSOR/CARDIOMEMS;  Surgeon: Rolan Ezra RAMAN, MD;  Location: [facility] INVASIVE CV LAB;  Service: Cardiovascular;  Laterality: N/A;   RIGHT HEART CATH N/A 05/27/2019   Procedure: RIGHT HEART CATH;  Surgeon: Rolan Ezra RAMAN, MD;  Location[facility] INVASIVE CV LAB;  Service: Cardiovascular;  Laterality: N/A;   RIGHT HEART CATH N/A 11/12/2023   Procedure: RIGHT HEART CATH;  Surgeon: Rolan,  Ezra RAMAN, MD;  Location: [facility] INVASIVE CV LAB;  Service: Cardiovascular;  Laterality: N/A;   TOTAL HIP ARTHROPLASTY Left 06/16/2020   Procedure: TOTAL HIP ARTHROPLASTY ANTERIOR APPROACH;  Surgeon: Fidel Rogue, MD;  Location: [facility] ORS;  Service: Orthopedics;  Laterality: Left;     reports that he has never smoked. He has never used smokeless tobacco. He reports that he does not drink alcohol  and does not use drugs.   Family History  Problem Relation Age of Onset   Emphysema Mother    Aneurysm Mother    Emphysema Father    Coronary artery disease Brother    Coronary artery disease Brother    Coronary artery disease Sister     Physical Exam:   Vitals:   07/23/24 0838 07/23/24 0842 07/23/24 1405 07/23/24 1737  BP: (!) 146/73  126/85 138/81  Pulse: 69  72 74  Resp: (!) 23  18 19   Temp: 97.7 F (36.5 C)  98.7 F (37.1 C) 97.9 F (36.6 C)  TempSrc:   Oral Oral  SpO2: 99%  100% 98%  Weight:  90 kg    Height:  5' 7 (1.702 m)     Constitutional: NAD, calm, comfortable Eyes: PERRL, lids and conjunctivae normal ENMT: Mucous membranes are moist. Posterior pharynx clear of any exudate or lesions.Normal dentition.  Neck: normal, supple, no masses, no thyromegaly Respiratory: clear to auscultation bilaterally, no wheezing, no crackles. Normal  respiratory effort. No accessory muscle use.  Cardiovascular: Regular rate and rhythm, no murmurs / rubs / gallops. No extremity edema. 2+ pedal pulses. No carotid bruits.  Abdomen: no tenderness, no masses palpated. No hepatosplenomegaly. Bowel sounds positive.  Musculoskeletal: no clubbing / cyanosis. No joint deformity upper and lower extremities. Good ROM, no contractures. Normal muscle tone.  Neurologic: CN II-XII grossly intact. Sensation intact, DTR normal. Strength 5/5 in all 4.  Psychiatric: Normal judgment and insight. Alert and oriented x 3. Normal mood.  Skin: no rashes, lesions, ulcers. No induration          Labs on admission:    I have personally reviewed following labs and imaging studies  CBC: Recent Labs  Lab 07/23/24 0845  WBC 8.0  HGB 10.9*  HCT 34.8*  MCV 91.6  PLT 273   Basic Metabolic Panel: Recent Labs  Lab 07/23/24 0845  NA 137  K 4.7  CL 95*  CO2 30  GLUCOSE 160*  BUN 74*  CREATININE 3.03*  CALCIUM  8.6*    BNP (last 3 results) Recent Labs    07/23/24 1045  PROBNP 1,028.0*    Urine analysis:    Component Value Date/Time   COLORURINE YELLOW 06/01/2018 2157   APPEARANCEUR CLEAR 06/01/2018 2157   LABSPEC 1.011 06/01/2018 2157   PHURINE 5.0 06/01/2018 2157   GLUCOSEU 50 (A) 06/01/2018 2157   HGBUR NEGATIVE 06/01/2018 2157   BILIRUBINUR NEGATIVE 06/01/2018 2157   KETONESUR NEGATIVE 06/01/2018 2157   PROTEINUR 100 (A) 06/01/2018 2157   UROBILINOGEN 1.0 05/04/2015 1415   NITRITE NEGATIVE 06/01/2018 2157   LEUKOCYTESUR NEGATIVE 06/01/2018 2157    Last A1C:  Lab Results  Component Value Date   HGBA1C 8.5 (H) 04/22/2023     Radiologic Exams on Admission:   DG Chest 2 View Result Date: 07/23/2024 EXAM: 2 VIEW(S) XRAY OF THE CHEST 07/23/2024 09:11:28 AM COMPARISON: 07/03/2024 CLINICAL HISTORY: Chest pain. FINDINGS: LINES, TUBES AND DEVICES: Left chest wall cardiac pacemaker/aicd with leads terminating in the right atrium, right  ventricle, and coronary sinus .  LUNGS AND PLEURA: Stable bibasilar linear opacities, likely atelectasis or scarring. Central pulmonary vascular congestion with bilateral perihilar and suture opacities. No pleural effusion. No pneumothorax. HEART AND MEDIASTINUM: Cardiomegaly, unchanged. Pulmonary artery pressure device noted (CardioMEMS) in the left lower PA. BONES AND SOFT TISSUES: Multilevel degenerative changes of thoracic spine. No acute osseous abnormality. IMPRESSION: 1. Cardiomegaly with findings of mild interstitial edema . Electronically signed by: Rogelia Myers MD 07/23/2024 09:18 AM EST RP Workstation: [id number]    EKG:   Independently reviewed.  Orders placed or performed during the hospital encounter of 07/23/24   ED EKG   ED EKG   EKG 12-Lead (at 6am)   EKG 12-Lead   ---------------------------------------------------------------------------------------------------------------------------------------    Assessment / Plan:   Principal Problem:   Chest pain Active Problems:   Acute on chronic combined systolic and diastolic CHF (congestive heart failure) (HCC)   Diabetes mellitus type 2, controlled, without complications (HCC)   [name] (chronic kidney disease), stage III (HCC)   HTN (hypertension)   History of TIA (transient ischemic attack)   Neuropathy (HCC)   Hyperlipidemia   Acute pulmonary edema (HCC)   GERD (gastroesophageal reflux disease)   Iron  deficiency   Assessment and Plan: * Chest pain - Presented with acute onset of transient chest pain, with possible AICD/defibrillator firing -History of LBBB, further intubational EKG per cardiology, no obvious ST elevation or depression -Troponin remained flat at 53 -Monitoring closely, as needed analgesics, nitroglycerin , -Review home medication, continue accordingly  [name] (chronic kidney disease), stage III (HCC) Acute on chronic kidney disease stage IIIb -Elevated BUN/creatinine from baseline Lab Results   Component Value Date   CREATININE 3.03 (H) 07/23/2024   CREATININE 2.91 (H) 06/05/2024   CREATININE 2.53 (H) 04/06/2024   - Monitor closely, unfortunately patient needs further diuretics - Reducing nephrotoxin dose, avoiding hypotension -If worsening BUN/creatinine, will consult nephrology for assistance  Diabetes mellitus type 2, controlled, without complications (HCC) - Checking CBG q. ACHS, SSI coverage -Continue Farxiga , holding glipizide   Acute on chronic combined systolic and diastolic CHF (congestive heart failure) (HCC) Acute on chronic systolic congestive heart failure exacerbation -Imaging consistent with congestion, elevated BNP 1028.0 -Mild shortness of breath -On Demadex  (100 mg p.o. twice daily) -holding - Continue with IV Lasix  40 mg twice daily -Review home medication, continue Farxiga , beta-blocker, spironolactone , Zaroxolyn  -Monitor kidney function, elevated BUN/creatinine from baseline -Monitoring I's and O's, daily weight  Cardiac history reviewed in detail  Last echo 12/13/2023: EF: 35 to 40%. The left ventricle has moderately decreased function. The left ventricle demonstrates global hypokinesis. Grade I diastolic dysfunction (impaired relaxation).   RHC in 5/25 showed mildly elevated filling pressures with preserved cardiac output. Farxiga  was restarted.   Cardiac MRI in 8/25 showed mild LV dilation with LV EF 34%, septal-lateral dyssynchrony, RV EF 40%, functionally bicuspid aortic valve with probably mild AS and mild A   - Cardiology consulted by EDP, appreciate further evaluation and recommendations  Iron  deficiency Continue iron  supplements  GERD (gastroesophageal reflux disease) Continue PPI, as needed GI cocktail, Maalox  Acute pulmonary edema (HCC) Not in respite distress or failure, congestions prominent on x-ray -Continue diuretics -Supplemental oxygen as needed.  Hyperlipidemia Continue atorvastatin   Neuropathy (HCC) Continue  Neurontin   HTN (hypertension) Monitor blood pressure closely -Review home medication, continue spironolactone  at lower dose, metaxalone, and carvedilol        Consults called: Cardiology -------------------------------------------------------------------------------------------------------------------------------------------- DVT prophylaxis:  heparin  injection 5,000 Units Start: 07/23/24 2200 Place TED hose Start: 07/23/24 1712 SCDs Start: 07/23/24  1709 SCDs Start: 07/23/24 1709 Place TED hose Start: 07/23/24 1709   Code Status:   Code Status: Full Code   Admission status: Patient will be admitted as Observation, with a greater than 2 midnight length of stay. Level of care: Telemetry   Family Communication:  none at bedside  (The above findings and plan of care has been discussed with patient in detail, the patient expressed understanding and agreement of above plan)  --------------------------------------------------------------------------------------------------------------------------------------------------  Disposition Plan:  Anticipated 1-2 days Status is: Observation The patient remains OBS appropriate and will d/c before 2 midnights.     ----------------------------------------------------------------------------------------------------------------------------------------------------  Time spent:  31  Min.  Was spent seeing and evaluating the patient, reviewing all medical records, drawn plan of care.  SIGNED: Adriana DELENA Grams, MD, FHM. FAAFP. [place] [facility], Pager  (Please use [email or web address] to page/ or secure chat through epic) If 7PM-7AM, please contact night-coverage [email or web address],  07/23/2024, 6:18 PM     [1]  Allergies Allergen Reactions   Cashew Nut Oil Palpitations   Percocet [Oxycodone -Acetaminophen ] Other (See Comments)    Hallucintaions   "

## 2024-07-23 NOTE — Consult Note (Signed)
 "  Cardiology Consultation   Patient ID: [name] MRN: [id number]; DOB: 03-Mar-1951  Admit date: 07/23/2024 Date of Consult: 07/23/2024  PCP:  Leontine Cramp, NP   [facility] Providers Cardiologist:  Ozell Fell, MD  Advanced Heart Failure:  Ezra Shuck, MD     Patient Profile: [name] is a 74 y.o. male with a hx of NICM s/p BiV AICD (07/03/24), RV Dysfunction, HTN, HLD, [name] IV, recurrent pneumonia, PEA arrest, hypertension, OSA, ascending aortic aneurysm, hyperlipidemia, type 2 diabetes,, who is being seen 07/23/2024 for the evaluation of chest pain and CHF at the request of Dr. Rogelia.  History of Present Illness: [name] reports that he was in his usual state of health until earlier today when he felt a shocking sensation in his chest and hurt his ICD alarm. He underwent successful Medtronic biventricular ICD placement by Dr. Waddell 07/03/2024 for primary prevention and treatment of an LBBB.  Device interrogation from 07/14/2024 at clinic revealed  A sensed Biv paced rhythm, battery and lead parameters stable, no treated arrhythmia.  His ICD wound was healing well.  A small hematoma was noted directly over the device.  He was instructed to monitor the site.  The patient was fearful that his device that shocked him and thus he presented to the ED for further evaluation.  Per ED workup today, BMP revealed BUN 74, creatinine 3.03, GFR 21.  High sensitive troponin 54 >53.  proBNP 1020.  CBC with hemoglobin 10.9.  Chest x-ray revealed cardiomegaly with mild interstitial edema. He was felt volume overloaded at ED and was given Lasix  40 mg x 1.  He was admitted to medicine for mild AKI.  Cardiology is now consulted for concern of chest pain and CHF.  Device was interrogated at ED, showed no AICD defibrillation or VT/VF episodes.  Furthermore, the patient had no atrial arrhythmias.  He did have an alert showing that his device will tone in the setting of a failed transmission which  occurred.  Since being in the ED the patient states that he feels fine and has had no recurrence of the shocking feeling or his device toning.  The mild chest discomfort he says is chronic, but he feels maybe has worsened since getting his ICD placed.  He endorses taking all of his medications as prescribed including his diuretics.   He last followed up with advanced heart failure clinic on 06/05/2024, he had mildly elevated PA TP by [facility], he was instructed to take torsemide  100 mg twice daily and metolazone  weekly on Sundays but taking an extra metolazone  that day with potassium supplement.  His GDMT was maintained with spironolactone  25 mg daily, carvedilol  12.5 mg twice daily, Farxiga  10 mg daily.       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   wife states blocked 30%   CHF (congestive heart failure) (HCC)    Chronic kidney disease    Diabetes mellitus 10/17/2011   newly dx today   Edema leg    right leg has leaky valve and right foot swells   Emphysema    Excessive ear wax    Fatty liver    Hernia    near navel   Hyperlipidemia    Hypertension    Leaky heart valve    Neuropathy    Rheumatic fever    Sleep apnea    Slow urinary stream    TIA (transient ischemic attack)  Past Surgical History:  Procedure Laterality Date   BIV ICD INSERTION CRT-D N/A 07/03/2024   Procedure: BIV ICD INSERTION CRT-D;  Surgeon: Waddell Danelle ORN, MD;  Location: [facility] INVASIVE CV LAB;  Service: Cardiovascular;  Laterality: N/A;   CIRCUMCISION     LEAD INSERTION N/A 07/03/2024   Procedure: LEAD INSERTION;  Surgeon: Waddell Danelle ORN, MD;  Location: [facility] INVASIVE CV LAB;  Service: Cardiovascular;  Laterality: N/A;   LITHOTRIPSY     PRESSURE SENSOR/CARDIOMEMS N/A 05/27/2019   Procedure: PRESSURE SENSOR/CARDIOMEMS;  Surgeon: Rolan Ezra RAMAN, MD;  Location: [facility] INVASIVE CV LAB;  Service: Cardiovascular;  Laterality: N/A;   RIGHT HEART CATH N/A  05/27/2019   Procedure: RIGHT HEART CATH;  Surgeon: Rolan Ezra RAMAN, MD;  Location: [facility] INVASIVE CV LAB;  Service: Cardiovascular;  Laterality: N/A;   RIGHT HEART CATH N/A 11/12/2023   Procedure: RIGHT HEART CATH;  Surgeon: Rolan Ezra RAMAN, MD;  Location: [facility] INVASIVE CV LAB;  Service: Cardiovascular;  Laterality: N/A;   TOTAL HIP ARTHROPLASTY Left 06/16/2020   Procedure: TOTAL HIP ARTHROPLASTY ANTERIOR APPROACH;  Surgeon: Fidel Rogue, MD;  Location: [facility] ORS;  Service: Orthopedics;  Laterality: Left;     Home Medications:  Prior to Admission medications  Medication Sig Start Date End Date Taking? Authorizing Provider  Potassium Chloride  ER 20 MEQ TBCR Take 1 tablet by mouth 2 (two) times daily. 07/15/24  Yes [provider]  acetaminophen  (TYLENOL ) 500 MG tablet Take 500-1,000 mg by mouth every 6 (six) hours as needed for moderate pain (pain score 4-6).    [provider]  ASPIRIN  81 PO Take 81 mg by mouth daily.    [provider]  atorvastatin  (LIPITOR) 40 MG tablet Take 1 tablet (40 mg total) by mouth every evening. 08/05/19   Rolan Ezra RAMAN, MD  carvedilol  (COREG ) 12.5 MG tablet TAKE ONE TABLET BY MOUTH TWICE A DAY 02/29/20   Rolan Ezra RAMAN, MD  dapagliflozin  propanediol (FARXIGA ) 10 MG TABS tablet Take 1 tablet (10 mg total) by mouth daily before breakfast. 11/12/23   Rolan Ezra RAMAN, MD  finasteride  (PROSCAR ) 5 MG tablet Take 5 mg by mouth daily.    [provider]  fluticasone  (FLONASE) 50 MCG/ACT nasal spray Place 1 spray into both nostrils daily as needed for allergies or rhinitis.    [provider]  gabapentin  (NEURONTIN ) 300 MG capsule Take 300 mg by mouth 2 (two) times daily.    [provider]  glipiZIDE  (GLUCOTROL  XL) 10 MG 24 hr tablet Take 10 mg by mouth daily. 09/11/23   [provider]  Iron  Combinations (IRON  COMPLEX PO) Take 1 tablet by mouth daily.    [provider]  metolazone  (ZAROXOLYN ) 2.5 MG  tablet Take 1 tablet (2.5 mg total) by mouth once a week. Patient taking differently: Take 2.5 mg by mouth every Sunday. 11/13/23   Rolan Ezra RAMAN, MD  montelukast  (SINGULAIR ) 10 MG tablet Take 10 mg by mouth daily.     [provider]  Multiple Vitamin (MULTIVITAMIN) tablet Take 1 tablet by mouth daily.    [provider]  pantoprazole  (PROTONIX ) 40 MG tablet Take 1 tablet (40 mg total) by mouth daily. 10/11/13   Theophilus Andrews, Tully GRADE, MD  potassium chloride  SA (KLOR-CON  M20) 20 MEQ tablet Take 2 tablets (40 mEq total) by mouth daily. Patient taking differently: Take 40 mEq by mouth See admin instructions. Take 40 mEq by mouth daily and take 20 mEq on Sundays with metolazone .  12/16/23   Rolan Ezra RAMAN, MD  spironolactone  (ALDACTONE ) 25 MG tablet Take 1 tablet (25 mg total) by mouth daily. 12/13/23   McLean, Dalton S, MD  SYMBICORT 80-4.5 MCG/ACT inhaler Inhale 2 puffs into the lungs daily as needed (wheezing). 08/28/19   [provider]  tamsulosin  (FLOMAX ) 0.4 MG CAPS capsule Take 0.4 mg by mouth daily. 05/03/15   [provider]  torsemide  (DEMADEX ) 100 MG tablet Take 1 tablet (100 mg total) by mouth 2 (two) times daily. 09/17/23   Glena Harlene HERO, FNP  VENTOLIN  HFA 108 (90 Base) MCG/ACT inhaler Inhale 2 puffs into the lungs every 6 (six) hours as needed for wheezing or shortness of breath.  08/19/19   [provider]    Scheduled Meds:  furosemide   40 mg Intravenous Once   Continuous Infusions:  PRN Meds:   Allergies:   Allergies[1]  Social History:   Social History   Socioeconomic History   Marital status: Married    Spouse name: Not on file   Number of children: Not on file   Years of education: Not on file   Highest education level: Not on file  Occupational History   Not on file  Tobacco Use   Smoking status: Never   Smokeless tobacco: Never  Vaping Use   Vaping status: Never Used  Substance and Sexual Activity    Alcohol  use: No   Drug use: No   Sexual activity: Not Currently    Partners: Female    Birth control/protection: None  Other Topics Concern   Not on file  Social History Narrative   Patient lives in private residence with supportive spouse   Social Drivers of Health   Tobacco Use: Low Risk (07/23/2024)   Patient History    Smoking Tobacco Use: Never    Smokeless Tobacco Use: Never    Passive Exposure: Not on file  Financial Resource Strain: Not on file  Food Insecurity: Not on file  Transportation Needs: Not on file  Physical Activity: Not on file  Stress: Not on file  Social Connections: Not on file  Intimate Partner Violence: Not on file  Depression (EYV7-0): Not on file  Alcohol  Screen: Not on file  Housing: Not on file  Utilities: Not on file  Health Literacy: Not on file    Family History:    Family History  Problem Relation Age of Onset   Emphysema Mother    Aneurysm Mother    Emphysema Father    Coronary artery disease Brother    Coronary artery disease Brother    Coronary artery disease Sister      ROS:  Please see the history of present illness.   All other ROS reviewed and negative.     Physical Exam/Data: Vitals:   07/23/24 0838 07/23/24 0842 07/23/24 1405  BP: (!) 146/73  126/85  Pulse: 69  72  Resp: (!) 23  18  Temp: 97.7 F (36.5 C)  98.7 F (37.1 C)  TempSrc:   Oral  SpO2: 99%  100%  Weight:  90 kg   Height:  5' 7 (1.702 m)    No intake or output data in the 24 hours ending 07/23/24 1709    07/23/2024    8:42 AM 07/03/2024    6:09 AM 06/05/2024    2:04 PM  Last 3 Weights  Weight (lbs) 198 lb 6.6 oz 200 lb 196 lb 3.2 oz  Weight (kg) 90 kg 90.719 kg 88.996 kg  Body mass index is 31.08 kg/m.  General:  Well nourished, well developed, in no acute distress HEENT: normal Neck: JVD measured 2 cm above clavicle at 45 degrees Vascular: No carotid bruits; Distal pulses 2+ bilaterally Cardiac:  normal S1, S2; RRR; no murmur, rubs or  gallops, PPM site c/d/i Lungs:  clear to auscultation bilaterally, no wheezing, rhonchi or rales  Abd: soft, nontender, abdomen is moderately distended Ext: no edema Musculoskeletal:  No deformities, BUE and BLE strength normal and equal Skin: warm and dry  Neuro:  CNs 2-12 intact, no focal abnormalities noted Psych:  Normal affect   EKG:  The EKG was personally reviewed and demonstrates: A sensed BiV paced rhythm  Telemetry:  Telemetry was personally reviewed and demonstrates: BiV paced rhythm  Relevant CV Studies:  ICD interrogation 07/14/2024 by Dr. Kennyth:  In-clinic interrogation of BiV ICD reviewed. Presenting Rhythm:A-sensed BiV-paced. Battery and lead parameters stable with stable capture and sensing. No treated arrhythmias. Routine testing was performed and device programming remains appropriate. Continue remote monitoring.    Cardiac MRI 02/12/2024 by Dr. Rolan:  IMPRESSION: 1. Mildly dilated LV with global hypokinesis and septal-lateral dyssynchrony consistent with LBBB. LV EF 34%.   2.  Normal RV size with RV EF 40%.   3. Functionally bicuspid aortic valve with visually mild aortic stenosis and mild aortic insufficiency by regurgitant fraction (though visually looks worse).   4. Mid-wall LGE at the basal to mid inferoseptal RV insertion site. This is nonspecific and can be seen with pressure/volume overload. Similar finding on prior cardiac MRI.   5. Mildly elevated extracellular volume percentage suggests mildly elevated myocardial fibrotic content.   6.  Moderately dilated ascending aorta at 45 mm.    Echo 12/13/23:   1. Left ventricular ejection fraction, by estimation, is 35 to 40%. The  left ventricle has moderately decreased function. The left ventricle  demonstrates global hypokinesis. Left ventricular diastolic parameters are  consistent with Grade I diastolic  dysfunction (impaired relaxation).   2. Right ventricular systolic function is normal. The  right ventricular  size is normal.   3. The mitral valve is degenerative. Mild mitral valve regurgitation.   4. The aortic valve is calcified. Aortic valve regurgitation is moderate.  NCC is immobile relative to RCC and LCC. Functional bicuspid valve. Mild  LFLG aortic stenosis (peak velocity 2.74m/s, MG 9.73mmHg, AVA (VTI)  1.68cm2, DI 0.49, SVi 34).   5. Aortic dilatation noted. Aneurysm of the ascending aorta, measuring 46  mm. There is dilatation of the aortic root, measuring 41 mm.   6. The inferior vena cava is normal in size with greater than 50%  respiratory variability, suggesting right atrial pressure of 3 mmHg.   Comparison(s): A prior study was performed on 09/21/2022. No significant  change from prior study.     Laboratory Data: High Sensitivity Troponin:  No results for input(s): TROPONINIHS in the last 720 hours.  Recent Labs  Lab 07/23/24 0845 07/23/24 1045  TRNPT 54* 53*      Chemistry Recent Labs  Lab 07/23/24 0845  NA 137  K 4.7  CL 95*  CO2 30  GLUCOSE 160*  BUN 74*  CREATININE 3.03*  CALCIUM  8.6*  GFRNONAA 21*  ANIONGAP 12    No results for input(s): PROT, ALBUMIN , AST, ALT, ALKPHOS, BILITOT in the last 168 hours. Lipids No results for input(s): CHOL, TRIG, HDL, LABVLDL, LDLCALC, CHOLHDL in the last 168 hours.  Hematology Recent Labs  Lab 07/23/24 0845  WBC 8.0  RBC 3.80*  HGB 10.9*  HCT 34.8*  MCV 91.6  MCH 28.7  MCHC 31.3  RDW 15.0  PLT 273   Thyroid No results for input(s): TSH, FREET4 in the last 168 hours.  BNP Recent Labs  Lab 07/23/24 1045  PROBNP 1,028.0*    DDimer No results for input(s): DDIMER in the last 168 hours.  Radiology/Studies:  DG Chest 2 View Result Date: 07/23/2024 EXAM: 2 VIEW(S) XRAY OF THE CHEST 07/23/2024 09:11:28 AM COMPARISON: 07/03/2024 CLINICAL HISTORY: Chest pain. FINDINGS: LINES, TUBES AND DEVICES: Left chest wall cardiac pacemaker/aicd with leads terminating in the  right atrium, right ventricle, and coronary sinus . LUNGS AND PLEURA: Stable bibasilar linear opacities, likely atelectasis or scarring. Central pulmonary vascular congestion with bilateral perihilar and suture opacities. No pleural effusion. No pneumothorax. HEART AND MEDIASTINUM: Cardiomegaly, unchanged. Pulmonary artery pressure device noted (CardioMEMS) in the left lower PA. BONES AND SOFT TISSUES: Multilevel degenerative changes of thoracic spine. No acute osseous abnormality. IMPRESSION: 1. Cardiomegaly with findings of mild interstitial edema . Electronically signed by: Rogelia Myers MD 07/23/2024 09:18 AM EST RP Workstation: [id number]     Assessment and Plan:  Belvin Gauss is a 74 y.o. male with a hx of NICM s/p BiV AICD (07/03/24), RV Dysfunction, HTN, HLD, [name] IV, recurrent pneumonia, PEA arrest, hypertension, OSA, ascending aortic aneurysm, hyperlipidemia, type 2 diabetes,, who is being seen 07/23/2024 for the evaluation of chest pain and CHF at the request of Dr. Rogelia.   #Acute on Chronic HFrEF s/p BiV ICD (07/03/24) s/p Cardiomems #RV Dysfunction - Patient presented for the evaluation of his ICD alarming and concern for an ICD shock. - Thankfully the patient did not have an ICD shock and his alarm was secondary to his ICD not being able to transmit to his receiver. - Per my review of the patient's device interrogation, it seems that his device is functioning well at this time. - The patient does have JVD and abdominal distention by physical exam along with some mild pulmonary edema on CXR.  I do suspect that he is somewhat volume overloaded, but this was incidental and he feels fine. - We will plan to give him some diuresis to get excess fluid off of him, but he will likely be able to discharge tomorrow. Give 120 mg IV Lasix  Continue home GDMT Strict I's and O's Consider turning off toning for device not transmitting to his receiver  #HTN #HLD - Continue home medications  #AKI  on [name] - Serum creatinine slightly above baseline which I suspect is probably cardiorenal. -Diuresing as above. Diuresis as above Daily BMP   Risk Assessment/Risk Scores:   [place]  Heart Association (NYHA) Functional Class NYHA Class III       For questions or updates, please contact [facility] Please consult [email or web address] for contact info under      Signed, Xika Zhao, NP  07/23/2024 5:09 PM     [1]  Allergies Allergen Reactions   Cashew Nut Oil Palpitations   Percocet [Oxycodone -Acetaminophen ] Other (See Comments)    Hallucintaions   "

## 2024-07-23 NOTE — Assessment & Plan Note (Signed)
 Acute on chronic systolic congestive heart failure exacerbation -Imaging consistent with congestion, elevated BNP 1028.0 -Mild shortness of breath -On Demadex  (100 mg p.o. twice daily) -holding - Continue with IV Lasix  40 mg twice daily -Review home medication, continue Farxiga , beta-blocker, spironolactone , Zaroxolyn  -Monitor kidney function, elevated BUN/creatinine from baseline -Monitoring I's and O's, daily weight  Cardiac history reviewed in detail  Last echo 12/13/2023: EF: 35 to 40%. The left ventricle has moderately decreased function. The left ventricle demonstrates global hypokinesis. Grade I diastolic dysfunction (impaired relaxation).   RHC in 5/25 showed mildly elevated filling pressures with preserved cardiac output. Farxiga  was restarted.   Cardiac MRI in 8/25 showed mild LV dilation with LV EF 34%, septal-lateral dyssynchrony, RV EF 40%, functionally bicuspid aortic valve with probably mild AS and mild A   - Cardiology consulted by EDP, appreciate further evaluation and recommendations

## 2024-07-23 NOTE — Assessment & Plan Note (Signed)
 Continue Neurontin

## 2024-07-23 NOTE — Assessment & Plan Note (Signed)
 Monitor blood pressure closely -Review home medication, continue spironolactone  at lower dose, metaxalone, and carvedilol 

## 2024-07-23 NOTE — Hospital Course (Addendum)
" °  [name] is a 74 year old male with significant history of HTN, HLD, DM II, chronic iron  deficiency, GERD, BPH, [name], LBBB, HFrEF S/P placement of ICD/defibrillator insertion 07/03/24, h/o PEA arrest in the past. Presenting with chief complaint of chest pain, chest discomfort believe his defibrillator fired. Patient reports started having some chest discomfort last night at rest, felt sensation of being kicked in the chest, lasted few seconds.  It was not associated with any shortness of breath, progressive chest pain, dizziness, nausea or vomiting.   ED Evaluation: Blood pressure 126/85, pulse 72, temperature 98.7 F (37.1 C), temperature source Oral, resp. rate 18, height 5' 7 (1.702 m), weight 90 kg, SpO2 100%.  LABs: Chloride 95, glucose 160, BUN 74 creatinine 3.03, calcium  8.6, BNP 1,028.0, troponin 54, 53, WBC 8.0, hemoglobin 10.9,  Patient received 40 mg IV Lasix . EDP Dr. Bernard reaching to [employer] tech to interrogate pacemaker/defibrillator, also cardiology for evaluation  Requested patient to be admitted for close monitoring and needing diuretics and a on acute or [name]        "

## 2024-07-23 NOTE — Assessment & Plan Note (Signed)
 Acute on chronic kidney disease stage IIIb -Elevated BUN/creatinine from baseline Lab Results  Component Value Date   CREATININE 3.03 (H) 07/23/2024   CREATININE 2.91 (H) 06/05/2024   CREATININE 2.53 (H) 04/06/2024   - Monitor closely, unfortunately patient needs further diuretics - Reducing nephrotoxin dose, avoiding hypotension -If worsening BUN/creatinine, will consult nephrology for assistance

## 2024-07-23 NOTE — Assessment & Plan Note (Signed)
 Continue PPI, as needed GI cocktail, Maalox

## 2024-07-23 NOTE — ED Notes (Signed)
Pacemaker interrigated 

## 2024-07-23 NOTE — TOC CM/SW Note (Signed)
 TOC consult received for d/c planning needs. Patient is followed by both the HF and Device clinics. Follow-up to be completed with patient as appropriate.   Merilee Batty, MSN, RN Case Management [phone]

## 2024-07-23 NOTE — Assessment & Plan Note (Signed)
-   Presented with acute onset of transient chest pain, with possible AICD/defibrillator firing -History of LBBB, further intubational EKG per cardiology, no obvious ST elevation or depression -Troponin remained flat at 53 -Monitoring closely, as needed analgesics, nitroglycerin , -Review home medication, continue accordingly

## 2024-07-23 NOTE — Assessment & Plan Note (Signed)
 Continue atorvastatin

## 2024-07-24 ENCOUNTER — Other Ambulatory Visit: Payer: Self-pay

## 2024-07-24 DIAGNOSIS — R079 Chest pain, unspecified: Secondary | ICD-10-CM | POA: Diagnosis not present

## 2024-07-24 DIAGNOSIS — I5023 Acute on chronic systolic (congestive) heart failure: Secondary | ICD-10-CM | POA: Diagnosis not present

## 2024-07-24 LAB — BASIC METABOLIC PANEL WITH GFR
Anion gap: 13 (ref 5–15)
BUN: 74 mg/dL — ABNORMAL HIGH (ref 8–23)
CO2: 28 mmol/L (ref 22–32)
Calcium: 8.8 mg/dL — ABNORMAL LOW (ref 8.9–10.3)
Chloride: 96 mmol/L — ABNORMAL LOW (ref 98–111)
Creatinine, Ser: 2.98 mg/dL — ABNORMAL HIGH (ref 0.61–1.24)
GFR, Estimated: 21 mL/min — ABNORMAL LOW
Glucose, Bld: 119 mg/dL — ABNORMAL HIGH (ref 70–99)
Potassium: 4.2 mmol/L (ref 3.5–5.1)
Sodium: 138 mmol/L (ref 135–145)

## 2024-07-24 LAB — PRO BRAIN NATRIURETIC PEPTIDE: Pro Brain Natriuretic Peptide: 1284 pg/mL — ABNORMAL HIGH

## 2024-07-24 MED ORDER — TORSEMIDE 20 MG PO TABS: 100.0000 mg | ORAL_TABLET | Freq: Two times a day (BID) | ORAL | Status: DC

## 2024-07-24 MED ORDER — TORSEMIDE 20 MG PO TABS
100.0000 mg | ORAL_TABLET | Freq: Two times a day (BID) | ORAL | Status: DC
  Administered 2024-07-24: 100 mg via ORAL
  Filled 2024-07-24: qty 5

## 2024-07-24 NOTE — Discharge Summary (Signed)
 Physician Discharge Summary  Daveon Arpino FMW:[id number] DOB: 23-Jul-1950 DOA: 07/23/2024  PCP: Leontine Cramp, NP  Admit date: 07/23/2024 Discharge date: 07/24/2024  Admitted From: Home Disposition:  Home  Discharge Condition:Stable CODE STATUS:FULL Diet recommendation: Heart Healthy   Brief/Interim Summary: Patient is a 74 year old male with history of hypertension, hyperlipidemia, diabetes type, chronic iron  deficiency GERD, BPH, [name], left bundle branch block, HFrEF status post ICD/defibrillator insertion on 07/03/2024, history of PEA arrest presented with chest pain and feeling of firing of defibrillator.  On presentation, he was hemodynamically stable.  Labs showed creatinine of 3, BNP of 1028.  Cardiology consulted.  He looked volume overloaded so was given a dose of  IV Lasix .  Underwent interrogation of pacemaker/defibrillator.  Concluded that he did not have ICD shock but it was the  alarm secondary to his ICD not being able to transmit to receiver.  He looks very comfortable today.  Looks euvolemic.  Lungs are clear on auscultation.  No peripheral edema.  Very eager to go home.  Cardiology cleared for discharge.  Continue home diuretics on discharge.  Medically stable for discharge home today.  Following problems were addressed during the hospitalization:  Chest pain: Initially thought to be from firing of AICD/defibrillator.  No significant EKG changes.  Troponins remain flat.  Chest pain has resolved   Acute on chronic combined systolic/diastolic CHF: Presented with volume overload, elevated BNP.  Was dyspneic at home.  Takes Demadex  at home. Given  high-dose IV Lasix .  Continue to monitor input/output, daily weight.  Cardiology following. Last echo showed EF of 35 to 40%, grade 1 diastolic dysfunction.  RHC on 5/25 showed mildly elevated filling pressures with preserved cardiac output.  Takes Farxiga , Coreg , spironolactone , metolazone . Chest x-ray on admission showed Cardiomegaly with  findings of mild interstitial edema . This morning he looks almost euvolemic.  No peripheral edema.  Lungs are almost clear to auscultation.  Continue home torsemide , metolazone .  Follow-up with CHF team as an outpatient   [name] stage IIIb-IV: Baseline creatinine around . Currently kidney function around baseline.  Continue diuresis at home.  Renal ultrasound shows increased echogenicity of the bilateral kidneys, suggesting medical renal disease.No hydronephrosis.  He needs to follow-up with nephrology as an outpatient.  He follows with [facility].  I advised him to do a BMP just in a week by following with his PCP.   Type 2 diabetes: Takes Farxiga , glipizide  at home   Iron  deficiency anemia: On iron  supplements.  Hemoglobin stable   GERD: Continue PPI   Hyperlipidemia: On statin   History of neuropathy: On Neurontin    Hypertension: Continue current medications.  Monitor blood pressure at home   Discharge Diagnoses:  Principal Problem:   Chest pain Active Problems:   Acute on chronic combined systolic and diastolic CHF (congestive heart failure) (HCC)   Diabetes mellitus type 2, controlled, without complications (HCC)   [name] (chronic kidney disease), stage III (HCC)   HTN (hypertension)   History of TIA (transient ischemic attack)   Neuropathy (HCC)   Hyperlipidemia   Acute pulmonary edema (HCC)   GERD (gastroesophageal reflux disease)   Iron  deficiency   Acute on chronic HFrEF (heart failure with reduced ejection fraction) (HCC)    Discharge Instructions  Discharge Instructions     Diet - low sodium heart healthy   Complete by: As directed    Discharge instructions   Complete by: As directed    1)Please take your medications as instructed 2)Follow up with your PCP  in a week.  Do a BMP test to check your kidney function.  Monitor your salt and fluid intake at home. 3)Follow up with your cardiologist and nephrologist as an outpatient   Increase activity slowly    Complete by: As directed       Allergies as of 07/24/2024       Reactions   Cashew Nut Oil Palpitations   Percocet [oxycodone -acetaminophen ] Other (See Comments)   Hallucintaions        Medication List     TAKE these medications    acetaminophen  500 MG tablet Commonly known as: TYLENOL  Take 500-1,000 mg by mouth every 6 (six) hours as needed for moderate pain (pain score 4-6).   aspirin  EC 81 MG tablet Take 81 mg by mouth daily. Swallow whole.   atorvastatin  40 MG tablet Commonly known as: LIPITOR Take 1 tablet (40 mg total) by mouth every evening.   carvedilol  12.5 MG tablet Commonly known as: COREG  TAKE ONE TABLET BY MOUTH TWICE A DAY   dapagliflozin  propanediol 10 MG Tabs tablet Commonly known as: Farxiga  Take 1 tablet (10 mg total) by mouth daily before breakfast.   finasteride  5 MG tablet Commonly known as: PROSCAR  Take 5 mg by mouth daily.   fluticasone  50 MCG/ACT nasal spray Commonly known as: FLONASE Place 1 spray into both nostrils daily as needed for allergies or rhinitis.   gabapentin  300 MG capsule Commonly known as: NEURONTIN  Take 300 mg by mouth 2 (two) times daily.   glipiZIDE  10 MG 24 hr tablet Commonly known as: GLUCOTROL  XL Take 10 mg by mouth daily.   IRON  COMPLEX PO Take 1 tablet by mouth daily.   metolazone  2.5 MG tablet Commonly known as: ZAROXOLYN  Take 1 tablet (2.5 mg total) by mouth once a week.   montelukast  10 MG tablet Commonly known as: SINGULAIR  Take 10 mg by mouth daily.   pantoprazole  40 MG tablet Commonly known as: Protonix  Take 1 tablet (40 mg total) by mouth daily.   potassium chloride  SA 20 MEQ tablet Commonly known as: Klor-Con  M20 Take 2 tablets (40 mEq total) by mouth daily. What changed:  how much to take when to take this additional instructions   spironolactone  25 MG tablet Commonly known as: ALDACTONE  Take 1 tablet (25 mg total) by mouth daily.   Symbicort 80-4.5 MCG/ACT inhaler Generic drug:  budesonide -formoterol Inhale 2 puffs into the lungs daily as needed (wheezing).   tamsulosin  0.4 MG Caps capsule Commonly known as: FLOMAX  Take 0.4 mg by mouth daily.   torsemide  100 MG tablet Commonly known as: DEMADEX  Take 1 tablet (100 mg total) by mouth 2 (two) times daily.   Ventolin  HFA 108 (90 Base) MCG/ACT inhaler Generic drug: albuterol  Inhale 2 puffs into the lungs every 6 (six) hours as needed for wheezing or shortness of breath.   VISION FORMULA PO Take 1 capsule by mouth daily.        Follow-up Information     Leontine Cramp, NP. Schedule an appointment as soon as possible for a visit in 1 week(s).   Specialty: Nurse Practitioner Contact information: [address] [place] [address] [phone]                Allergies[1]  Consultations: Cardiology   Procedures/Studies: US  RENAL Result Date: 07/23/2024 EXAM: RETROPERITONEAL ULTRASOUND OF THE KIDNEYS 07/23/2024 11:26:53 PM TECHNIQUE: Real-time ultrasonography of the retroperitoneum, specifically the kidneys and urinary bladder, was performed. COMPARISON: US  ABD complete dated 10/29/2016. CLINICAL HISTORY: Acute kidney injury.  FINDINGS: RIGHT KIDNEY: Right kidney measures 10.1 x 4.6 x 5.0 cm. Increased echogenicity of the right kidney. Simple lower pole cyst measuring 1.0 x 1.1 x 0.8 cm. No hydronephrosis. No calculus. No mass. LEFT KIDNEY: Left kidney measures 10.7 x 5.5 x 5.1 cm. Increased echogenicity of the left kidney. Simple upper pole cyst measuring 1.4 x 1.2 x 1.2 cm. No hydronephrosis. No calculus. No mass. BLADDER: Unremarkable appearance of the bladder. IMPRESSION: 1. Increased echogenicity of the bilateral kidneys, suggesting medical renal disease. 2. No hydronephrosis. Electronically signed by: Pinkie Pebbles MD 07/23/2024 11:30 PM EST RP Workstation: [id number]   DG Chest 2 View Result Date: 07/23/2024 EXAM: 2 VIEW(S) XRAY OF THE CHEST 07/23/2024 09:11:28 AM COMPARISON: 07/03/2024 CLINICAL  HISTORY: Chest pain. FINDINGS: LINES, TUBES AND DEVICES: Left chest wall cardiac pacemaker/aicd with leads terminating in the right atrium, right ventricle, and coronary sinus . LUNGS AND PLEURA: Stable bibasilar linear opacities, likely atelectasis or scarring. Central pulmonary vascular congestion with bilateral perihilar and suture opacities. No pleural effusion. No pneumothorax. HEART AND MEDIASTINUM: Cardiomegaly, unchanged. Pulmonary artery pressure device noted (CardioMEMS) in the left lower PA. BONES AND SOFT TISSUES: Multilevel degenerative changes of thoracic spine. No acute osseous abnormality. IMPRESSION: 1. Cardiomegaly with findings of mild interstitial edema . Electronically signed by: Rogelia Myers MD 07/23/2024 09:18 AM EST RP Workstation: [email or web address]   CUP PACEART INCLINIC DEVICE CHECK Result Date: 07/14/2024 Normal multi chamber ICD wound check. Wound well healed. Small hematoma noted directly over device. Requested RU, PA-C in clinic to assess and they determined that a pressure bandage was not necessary at this time. Presenting rhythm: AS/VP. Routine testing performed. Thresholds, sensing, and impedance consistent with implant measurements with 3.5V safety margin/auto capture until 3 month visit. No treated arrhythmias. Reviewed arm restrictions to continue for 6 weeks total post op. Reviewed shock plan.  Pt enrolled in remote follow-up. LV pulse width previously programmed at 0.8 mS for unknown reasons to this RN. LV threshold measured 0.75 V @ 0.8 mS today. This RN will leave the previously programmed setting until the patient returns for his 91 post-op check.  Of note, the LV was tested at lower pulse widths and was still in good range (1.0 V @ 0.5 mS, and 1.25 V @ 0.4 mS) No remote transmission received from patient since implant and device marked as disconnected in [facility]. Discussed this issue with the patient and his wife. Phone numbers given to patient and wife for the [employer]  patient and tech support services to  help patient reconnect for remote monitoring.Bard Silvan, BSN, RN  DG Chest 2 View Result Date: 07/03/2024 CLINICAL DATA:  Defibrillator placement EXAM: CHEST - 2 VIEW COMPARISON:  June 14, 2020 FINDINGS: Stable cardiomediastinal silhouette. Interval placement of left-sided defibrillator with leads in grossly good position. No definite pneumothorax is noted. Minimal bibasilar subsegmental atelectasis or scarring is noted. IMPRESSION: Interval placement of left-sided defibrillator. Minimal bibasilar subsegmental atelectasis or scarring. Electronically Signed   By: Lynwood Landy Raddle M.D.   On: 07/03/2024 16:03   EP PPM/ICD IMPLANT Result Date: 07/03/2024 Conclusion: Successful insertion of a [employer] biventricular ICD in a patient with chronic systolic heart failure, left bundle branch block, ejection fraction 25%. Danelle Birmingham, MD      Subjective: Patient seen and examined at bedside today.  Hemodynamically stable.  Very comfortable today.  Lying in bed.  No peripheral edema.  On room air.  Lungs are clear on auscultation.  Very eager to go home.  He says  he lives with his wife and does not have any ambulatory problems.  Discharge Exam: Vitals:   07/24/24 0729 07/24/24 0800  BP:  118/68  Pulse:  78  Resp:  14  Temp: 98.7 F (37.1 C)   SpO2:  94%   Vitals:   07/24/24 0600 07/24/24 0725 07/24/24 0729 07/24/24 0800  BP: (!) 140/75 123/70  118/68  Pulse: 75 68  78  Resp: 17 16  14   Temp:   98.7 F (37.1 C)   TempSrc:   Oral   SpO2: 94% 96%  94%  Weight:      Height:        General: Pt is alert, awake, not in acute distress Cardiovascular: RRR, S1/S2 +, no rubs, no gallops Respiratory: CTA bilaterally, no wheezing, no rhonchi Abdominal: Soft, NT, ND, bowel sounds + Extremities: no edema, no cyanosis    The results of significant diagnostics from this hospitalization (including imaging, microbiology, ancillary and laboratory) are listed  below for reference.     Microbiology: No results found for this or any previous visit (from the past 240 hours).   Labs: BNP (last 3 results) Recent Labs    12/13/23 1548 04/06/24 1426 06/05/24 1431  BNP 291.1* 203.4* 361.2*   Basic Metabolic Panel: Recent Labs  Lab 07/23/24 0845 07/23/24 2154 07/24/24 0221  NA 137  --  138  K 4.7  --  4.2  CL 95*  --  96*  CO2 30  --  28  GLUCOSE 160*  --  119*  BUN 74*  --  74*  CREATININE 3.03*  --  2.98*  CALCIUM  8.6*  --  8.8*  MG  --  2.3  --   PHOS  --  4.6  --    Liver Function Tests: No results for input(s): AST, ALT, ALKPHOS, BILITOT, PROT, ALBUMIN  in the last 168 hours. No results for input(s): LIPASE, AMYLASE in the last 168 hours. No results for input(s): AMMONIA in the last 168 hours. CBC: Recent Labs  Lab 07/23/24 0845  WBC 8.0  HGB 10.9*  HCT 34.8*  MCV 91.6  PLT 273   Cardiac Enzymes: No results for input(s): CKTOTAL, CKMB, CKMBINDEX, TROPONINI in the last 168 hours. BNP: Invalid input(s): POCBNP CBG: No results for input(s): GLUCAP in the last 168 hours. D-Dimer No results for input(s): DDIMER in the last 72 hours. Hgb A1c No results for input(s): HGBA1C in the last 72 hours. Lipid Profile No results for input(s): CHOL, HDL, LDLCALC, TRIG, CHOLHDL, LDLDIRECT in the last 72 hours. Thyroid function studies No results for input(s): TSH, T4TOTAL, T3FREE, THYROIDAB in the last 72 hours.  Invalid input(s): FREET3 Anemia work up No results for input(s): VITAMINB12, FOLATE, FERRITIN, TIBC, IRON , RETICCTPCT in the last 72 hours. Urinalysis    Component Value Date/Time   COLORURINE YELLOW 06/01/2018 2157   APPEARANCEUR CLEAR 06/01/2018 2157   LABSPEC 1.011 06/01/2018 2157   PHURINE 5.0 06/01/2018 2157   GLUCOSEU 50 (A) 06/01/2018 2157   HGBUR NEGATIVE 06/01/2018 2157   BILIRUBINUR NEGATIVE 06/01/2018 2157   KETONESUR NEGATIVE  06/01/2018 2157   PROTEINUR 100 (A) 06/01/2018 2157   UROBILINOGEN 1.0 05/04/2015 1415   NITRITE NEGATIVE 06/01/2018 2157   LEUKOCYTESUR NEGATIVE 06/01/2018 2157   Sepsis Labs Recent Labs  Lab 07/23/24 0845  WBC 8.0   Microbiology No results found for this or any previous visit (from the past 240 hours).  Please note: You were cared for by a hospitalist during your  hospital stay. Once you are discharged, your primary care physician will handle any further medical issues. Please note that NO REFILLS for any discharge medications will be authorized once you are discharged, as it is imperative that you return to your primary care physician (or establish a relationship with a primary care physician if you do not have one) for your post hospital discharge needs so that they can reassess your need for medications and monitor your lab values.    Time coordinating discharge: 40 minutes  SIGNED:   Ivonne Mustache, MD  [facility] 07/24/2024, 10:08 AM Pager [phone]  If 7PM-7AM, please contact night-coverage [email or web address] Password [email or web address]    [1]  Allergies Allergen Reactions   Cashew Nut Oil Palpitations   Percocet [Oxycodone -Acetaminophen ] Other (See Comments)    Hallucintaions

## 2024-07-24 NOTE — ED Notes (Addendum)
 Pt unable to reach wife to get patient for discharge at this time. Voicemail left for Graham Hyun (wife). ED charge notified.

## 2024-07-24 NOTE — Progress Notes (Signed)
 "  Rounding Note   Patient Name: [name] Date of Encounter: 07/24/2024  [facility] Cardiologist: Ozell Fell, MD   Subjective - No acute events overnight - Patient says that he feels great and is eager to go home.  Denies chest pain. - He had good urine output to diuresis  Scheduled Meds:  alum & mag hydroxide-simeth  30 mL Oral Once   aspirin   81 mg Oral Daily   atorvastatin   40 mg Oral QPM   carvedilol   12.5 mg Oral BID   dapagliflozin  propanediol  10 mg Oral QAC breakfast   ferrous sulfate   325 mg Oral Daily   finasteride   5 mg Oral Daily   gabapentin   300 mg Oral BID   heparin   5,000 Units Subcutaneous Q8H   [START ON 07/26/2024] metolazone   2.5 mg Oral Q Sun   pantoprazole   40 mg Oral Daily   senna-docusate  1 tablet Oral QHS   sodium chloride  flush  3 mL Intravenous Q12H   sodium chloride  flush  3 mL Intravenous Q12H   spironolactone   12.5 mg Oral Daily   tamsulosin   0.4 mg Oral Daily   torsemide   100 mg Oral BID   Continuous Infusions:  PRN Meds: acetaminophen , ALPRAZolam , hydrALAZINE , HYDROmorphone  (DILAUDID ) injection, ipratropium, ondansetron  (ZOFRAN ) IV, traZODone    Vital Signs  Vitals:   07/24/24 0725 07/24/24 0729 07/24/24 0800 07/24/24 1000  BP: 123/70  118/68 109/74  Pulse: 68  78 70  Resp: 16  14 17   Temp:  98.7 F (37.1 C)    TempSrc:  Oral    SpO2: 96%  94% 93%  Weight:      Height:        Intake/Output Summary (Last 24 hours) at 07/24/2024 1048 Last data filed at 07/24/2024 0530 Gross per 24 hour  Intake --  Output 1100 ml  Net -1100 ml      07/23/2024    8:42 AM 07/03/2024    6:09 AM 06/05/2024    2:04 PM  Last 3 Weights  Weight (lbs) 198 lb 6.6 oz 200 lb 196 lb 3.2 oz  Weight (kg) 90 kg 90.719 kg 88.996 kg      Telemetry A sensed V paced rhythm, occasional NSVT- Personally Reviewed  ECG  No new ECG  Physical Exam  GEN: No acute distress.   Neck: No JVD Cardiac: RRR, II/VI systolic murmur at RUSB, no rubs,  or gallops.  Respiratory: Clear to auscultation bilaterally. GI: Soft, nontender, non-distended  MS: No edema; No deformity. Neuro:  Nonfocal  Psych: Normal affect   Labs High Sensitivity Troponin:  No results for input(s): TROPONINIHS in the last 720 hours.  Recent Labs  Lab 07/23/24 0845 07/23/24 1045  TRNPT 54* 53*       Chemistry Recent Labs  Lab 07/23/24 0845 07/23/24 2154 07/24/24 0221  NA 137  --  138  K 4.7  --  4.2  CL 95*  --  96*  CO2 30  --  28  GLUCOSE 160*  --  119*  BUN 74*  --  74*  CREATININE 3.03*  --  2.98*  CALCIUM  8.6*  --  8.8*  MG  --  2.3  --   GFRNONAA 21*  --  21*  ANIONGAP 12  --  13    Lipids No results for input(s): CHOL, TRIG, HDL, LABVLDL, LDLCALC, CHOLHDL in the last 168 hours.  Hematology Recent Labs  Lab 07/23/24 0845  WBC 8.0  RBC  3.80*  HGB 10.9*  HCT 34.8*  MCV 91.6  MCH 28.7  MCHC 31.3  RDW 15.0  PLT 273   Thyroid No results for input(s): TSH, FREET4 in the last 168 hours.  BNP Recent Labs  Lab 07/23/24 1045 07/24/24 0221  PROBNP 1,028.0* 1,284.0*    DDimer No results for input(s): DDIMER in the last 168 hours.   Radiology  US  RENAL Result Date: 07/23/2024 EXAM: RETROPERITONEAL ULTRASOUND OF THE KIDNEYS 07/23/2024 11:26:53 PM TECHNIQUE: Real-time ultrasonography of the retroperitoneum, specifically the kidneys and urinary bladder, was performed. COMPARISON: US  ABD complete dated 10/29/2016. CLINICAL HISTORY: Acute kidney injury. FINDINGS: RIGHT KIDNEY: Right kidney measures 10.1 x 4.6 x 5.0 cm. Increased echogenicity of the right kidney. Simple lower pole cyst measuring 1.0 x 1.1 x 0.8 cm. No hydronephrosis. No calculus. No mass. LEFT KIDNEY: Left kidney measures 10.7 x 5.5 x 5.1 cm. Increased echogenicity of the left kidney. Simple upper pole cyst measuring 1.4 x 1.2 x 1.2 cm. No hydronephrosis. No calculus. No mass. BLADDER: Unremarkable appearance of the bladder. IMPRESSION: 1. Increased  echogenicity of the bilateral kidneys, suggesting medical renal disease. 2. No hydronephrosis. Electronically signed by: Pinkie Pebbles MD 07/23/2024 11:30 PM EST RP Workstation: [id number]   DG Chest 2 View Result Date: 07/23/2024 EXAM: 2 VIEW(S) XRAY OF THE CHEST 07/23/2024 09:11:28 AM COMPARISON: 07/03/2024 CLINICAL HISTORY: Chest pain. FINDINGS: LINES, TUBES AND DEVICES: Left chest wall cardiac pacemaker/aicd with leads terminating in the right atrium, right ventricle, and coronary sinus . LUNGS AND PLEURA: Stable bibasilar linear opacities, likely atelectasis or scarring. Central pulmonary vascular congestion with bilateral perihilar and suture opacities. No pleural effusion. No pneumothorax. HEART AND MEDIASTINUM: Cardiomegaly, unchanged. Pulmonary artery pressure device noted (CardioMEMS) in the left lower PA. BONES AND SOFT TISSUES: Multilevel degenerative changes of thoracic spine. No acute osseous abnormality. IMPRESSION: 1. Cardiomegaly with findings of mild interstitial edema . Electronically signed by: Rogelia Myers MD 07/23/2024 09:18 AM EST RP Workstation: [id number]    Cardiac Studies  ICD interrogation 07/14/2024 by Dr. Kennyth:   In-clinic interrogation of BiV ICD reviewed. Presenting Rhythm:A-sensed BiV-paced. Battery and lead parameters stable with stable capture and sensing. No treated arrhythmias. Routine testing was performed and device programming remains appropriate. Continue remote monitoring.      Cardiac MRI 02/12/2024 by Dr. Rolan:   IMPRESSION: 1. Mildly dilated LV with global hypokinesis and septal-lateral dyssynchrony consistent with LBBB. LV EF 34%.   2.  Normal RV size with RV EF 40%.   3. Functionally bicuspid aortic valve with visually mild aortic stenosis and mild aortic insufficiency by regurgitant fraction (though visually looks worse).   4. Mid-wall LGE at the basal to mid inferoseptal RV insertion site. This is nonspecific and can be seen with  pressure/volume overload. Similar finding on prior cardiac MRI.   5. Mildly elevated extracellular volume percentage suggests mildly elevated myocardial fibrotic content.   6.  Moderately dilated ascending aorta at 45 mm.     Echo 12/13/23:    1. Left ventricular ejection fraction, by estimation, is 35 to 40%. The  left ventricle has moderately decreased function. The left ventricle  demonstrates global hypokinesis. Left ventricular diastolic parameters are  consistent with Grade I diastolic  dysfunction (impaired relaxation).   2. Right ventricular systolic function is normal. The right ventricular  size is normal.   3. The mitral valve is degenerative. Mild mitral valve regurgitation.   4. The aortic valve is calcified. Aortic valve regurgitation is moderate.  NCC is  immobile relative to RCC and LCC. Functional bicuspid valve. Mild  LFLG aortic stenosis (peak velocity 2.37m/s, MG 9.66mmHg, AVA (VTI)  1.68cm2, DI 0.49, SVi 34).   5. Aortic dilatation noted. Aneurysm of the ascending aorta, measuring 46  mm. There is dilatation of the aortic root, measuring 41 mm.   6. The inferior vena cava is normal in size with greater than 50%  respiratory variability, suggesting right atrial pressure of 3 mmHg.   Comparison(s): A prior study was performed on 09/21/2022. No significant  change from prior study.   Patient Profile   [name] is a 74 y.o. male with a hx of NICM s/p BiV AICD (07/03/24), RV Dysfunction, HTN, HLD, [name] IV, recurrent pneumonia, PEA arrest, hypertension, OSA, ascending aortic aneurysm, hyperlipidemia, type 2 diabetes,, who is being seen 07/23/2024 for the evaluation of chest pain and CHF at the request of Dr. Rogelia.   Assessment & Plan    #Acute on Chronic HFrEF s/p BiV ICD (07/03/24) s/p Cardiomems #RV Dysfunction - Patient presented for the evaluation of his ICD alarming and concern for an ICD shock. - Thankfully the patient did not have an ICD shock and his alarm  was secondary to his ICD not being able to transmit to [facility]. - Per my review of the patient's device interrogation, it seems that his device is functioning well at this time. - The patient did appear somewhat volume overloaded on admission so he was given IV diuresis with great urine output.  Although he is not 100% euvolemic, his volume status is already improved significantly and he is asymptomatic.  I believe that he is safe for discharge and he has close follow-up with the heart failure clinic. -Lastly, I spoke with the EP and they had the [employer] rep adjusted his alarm so that it will not tone for a failed transmission. Restart home oral diuretics Continue home GDMT Strict I's and O's The [employer] rep will adjust the patient's alarm setting so that it will not tone for a failed transmission   #HTN #HLD - Continue home medications   #AKI on [name] - Serum creatinine slightly above baseline which I suspect is probably cardiorenal. -Diuresing as above. Diuresis as above Daily BMP    [facility] will sign off.   The patient is ready for discharge today from a cardiac standpoint. Medication Recommendations: No changes Other recommendations (labs, testing, etc): BMP Follow up as an outpatient: The patient has heart failure clinic follow-up established For questions or updates, please contact [facility] Please consult [email or web address] for contact info under       Signed, Georganna Archer, MD  07/24/2024, 10:48 AM    "

## 2024-07-24 NOTE — ED Notes (Signed)
 Social work contacted regarding patient transportation issues home.

## 2024-07-24 NOTE — ED Notes (Signed)
 Called all pt family contacts for patient discharge transportation. No answer from any contact at this time. ED charge notified.

## 2024-07-24 NOTE — Progress Notes (Signed)
 Heart Failure Navigator Progress Note  Assessed for Heart & Vascular TOC clinic readiness.  Patient is already established with [facility] clinic, patient of Dr. Rolan.   Will sign off.  Duwaine Plant, PharmD, BCPS Heart Failure Stewardship Pharmacist Phone [phone]

## 2024-07-24 NOTE — Telephone Encounter (Signed)
Transmission received.

## 2024-07-24 NOTE — Care Management Obs Status (Signed)
 MEDICARE OBSERVATION STATUS NOTIFICATION   Patient Details  Name: Mickie Kozikowski MRN: [id number] Date of Birth: 06/05/1951   Medicare Observation Status Notification Given:  Yes    Debarah Saunas, RN 07/24/2024, 11:36 AM

## 2024-07-24 NOTE — Discharge Planning (Signed)
 RNCM consulted regarding ED staff  unable to reach family for discharge home.  RNCM called number listed as daughter Nyla).  Mitzie will contact wife for pickup.  Chadwick Reiswig J. Debarah, BSN, RN, [facility]  Inpatient Care Management  Nurse Case Manager  [facility] Emergency Departments  Operative Services  [phone]

## 2024-08-05 ENCOUNTER — Ambulatory Visit: Admitting: Podiatry

## 2024-08-05 ENCOUNTER — Encounter: Payer: Self-pay | Admitting: Podiatry

## 2024-08-05 ENCOUNTER — Telehealth: Payer: Self-pay

## 2024-08-05 DIAGNOSIS — E119 Type 2 diabetes mellitus without complications: Secondary | ICD-10-CM

## 2024-08-05 DIAGNOSIS — B351 Tinea unguium: Secondary | ICD-10-CM

## 2024-08-05 NOTE — Progress Notes (Signed)
 This patient returns to my office for at risk foot care.  This patient requires this care by a professional since this patient will be at risk due to having diabetes type 2 and chronic venous insufficiency.  This patient is unable to cut nails himself since the patient cannot reach his nails.These nails are painful walking and wearing shoes.  This patient presents for at risk foot care today.  General Appearance  Alert, conversant and in no acute stress.  Vascular  Dorsalis pedis and posterior tibial  pulses are not  palpable  bilaterally.  Capillary return is within normal limits  bilaterally. Temperature is within normal limits  bilaterally.  Neurologic  Senn-Weinstein monofilament wire test diminished/absent  bilaterally. Muscle power within normal limits bilaterally.  Nails Thick disfigured discolored nails with subungual debris  from hallux to fifth toes bilaterally. No evidence of bacterial infection or drainage bilaterally.  Orthopedic  No limitations of motion  feet .  No crepitus or effusions noted.  No bony pathology or digital deformities noted.  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  Midfoot  DJD  B/L.  Limited rearfoot  ROM  B/L.  Skin  normotropic skin with no porokeratosis noted bilaterally.  No signs of infections or ulcers noted.     Onychomycosis  Pain in right toes  Pain in left toes  Consent was obtained for treatment procedures.   Mechanical debridement of nails 1-5  bilaterally performed with a nail nipper.  Filed with dremel without incident.    Return office visit    3 months                  Told patient to return for periodic foot care and evaluation due to potential at risk complications.   Helane Gunther DPM

## 2024-08-05 NOTE — Telephone Encounter (Signed)
 Called to confirm/remind patient of their appointment at the [facility] on 08/06/24.   Appointment:   [] Confirmed  [x] Left mess   [] No answer/No voice mail  [] VM Full/unable to leave message  [] Phone not in service  And to bring in all medications and/or complete list.

## 2024-08-06 ENCOUNTER — Encounter (HOSPITAL_COMMUNITY): Payer: Self-pay

## 2024-08-06 ENCOUNTER — Ambulatory Visit (HOSPITAL_COMMUNITY): Payer: Self-pay | Admitting: Internal Medicine

## 2024-08-06 ENCOUNTER — Ambulatory Visit (HOSPITAL_COMMUNITY)
Admission: RE | Admit: 2024-08-06 | Discharge: 2024-08-06 | Disposition: A | Source: Ambulatory Visit | Attending: Internal Medicine

## 2024-08-06 VITALS — BP 118/60 | HR 68 | Wt 194.4 lb

## 2024-08-06 DIAGNOSIS — N184 Chronic kidney disease, stage 4 (severe): Secondary | ICD-10-CM

## 2024-08-06 DIAGNOSIS — G4733 Obstructive sleep apnea (adult) (pediatric): Secondary | ICD-10-CM | POA: Diagnosis not present

## 2024-08-06 DIAGNOSIS — E118 Type 2 diabetes mellitus with unspecified complications: Secondary | ICD-10-CM

## 2024-08-06 DIAGNOSIS — I5022 Chronic systolic (congestive) heart failure: Secondary | ICD-10-CM

## 2024-08-06 DIAGNOSIS — E611 Iron deficiency: Secondary | ICD-10-CM

## 2024-08-06 DIAGNOSIS — I7121 Aneurysm of the ascending aorta, without rupture: Secondary | ICD-10-CM

## 2024-08-06 DIAGNOSIS — E785 Hyperlipidemia, unspecified: Secondary | ICD-10-CM | POA: Diagnosis not present

## 2024-08-06 LAB — BASIC METABOLIC PANEL WITH GFR
Anion gap: 13 (ref 5–15)
BUN: 74 mg/dL — ABNORMAL HIGH (ref 8–23)
CO2: 30 mmol/L (ref 22–32)
Calcium: 8.9 mg/dL (ref 8.9–10.3)
Chloride: 94 mmol/L — ABNORMAL LOW (ref 98–111)
Creatinine, Ser: 2.67 mg/dL — ABNORMAL HIGH (ref 0.61–1.24)
GFR, Estimated: 24 mL/min — ABNORMAL LOW
Glucose, Bld: 137 mg/dL — ABNORMAL HIGH (ref 70–99)
Potassium: 4.1 mmol/L (ref 3.5–5.1)
Sodium: 137 mmol/L (ref 135–145)

## 2024-08-06 LAB — PRO BRAIN NATRIURETIC PEPTIDE: Pro Brain Natriuretic Peptide: 1133 pg/mL — ABNORMAL HIGH

## 2024-08-06 NOTE — Patient Instructions (Addendum)
 Good to see you today!  PLEASE take metolazone  2.5 mg with 40 meq of potassium today  Labs done today, your results will be available in MyChart, we will contact you for abnormal readings.  Your physician recommends that you schedule a follow-up appointment as scheduled  If you have any questions or concerns before your next appointment please send us  a message through [place] or call our office at [phone].    TO LEAVE A MESSAGE FOR THE NURSE SELECT OPTION 2, PLEASE LEAVE A MESSAGE INCLUDING: YOUR NAME DATE OF BIRTH CALL BACK NUMBER REASON FOR CALL**this is important as we prioritize the call backs  YOU WILL RECEIVE A CALL BACK THE SAME DAY AS LONG AS YOU CALL BEFORE 4:00 PM At the [facility], you and your health needs are our priority. As part of our continuing mission to provide you with exceptional heart care, we have created designated Provider Care Teams. These Care Teams include your primary Cardiologist (physician) and Advanced Practice Providers (APPs- Physician Assistants and Nurse Practitioners) who all work together to provide you with the care you need, when you need it.   You may see any of the following providers on your designated Care Team at your next follow up: Dr Toribio Fuel Dr Ezra Shuck Dr. Morene Brownie Greig Mosses, NP Caffie Shed, [place] [facility] [place], [place] Beckey Coe, NP Jordan Lee, NP Ellouise Class, NP Tinnie Redman, PharmD Jaun Bash, PharmD   Please be sure to bring in all your medications bottles to every appointment.    Thank you for choosing [facility]

## 2024-08-14 ENCOUNTER — Ambulatory Visit

## 2024-08-20 ENCOUNTER — Ambulatory Visit (HOSPITAL_COMMUNITY)

## 2024-10-05 ENCOUNTER — Ambulatory Visit: Admitting: Pulmonary Disease

## 2024-11-02 ENCOUNTER — Ambulatory Visit: Admitting: Podiatry

## 2024-11-13 ENCOUNTER — Ambulatory Visit

## 2025-02-12 ENCOUNTER — Ambulatory Visit

## 2025-05-14 ENCOUNTER — Ambulatory Visit

## 2025-08-13 ENCOUNTER — Ambulatory Visit
